# Patient Record
Sex: Male | Born: 1935
Health system: Southern US, Community
[De-identification: ages and names within clinical notes are randomized; demographics above are authoritative.]

## PROBLEM LIST (undated history)

## (undated) DIAGNOSIS — K1379 Other lesions of oral mucosa: Secondary | ICD-10-CM

## (undated) DIAGNOSIS — I10 Essential (primary) hypertension: Secondary | ICD-10-CM

## (undated) DIAGNOSIS — E039 Hypothyroidism, unspecified: Secondary | ICD-10-CM

## (undated) DIAGNOSIS — Z8673 Personal history of transient ischemic attack (TIA), and cerebral infarction without residual deficits: Secondary | ICD-10-CM

## (undated) DIAGNOSIS — E559 Vitamin D deficiency, unspecified: Secondary | ICD-10-CM

## (undated) DIAGNOSIS — U071 COVID-19: Secondary | ICD-10-CM

## (undated) DIAGNOSIS — M1990 Unspecified osteoarthritis, unspecified site: Secondary | ICD-10-CM

## (undated) DIAGNOSIS — K602 Anal fissure, unspecified: Secondary | ICD-10-CM

## (undated) DIAGNOSIS — I119 Hypertensive heart disease without heart failure: Secondary | ICD-10-CM

## (undated) DIAGNOSIS — E785 Hyperlipidemia, unspecified: Secondary | ICD-10-CM

## (undated) DIAGNOSIS — B029 Zoster without complications: Secondary | ICD-10-CM

## (undated) DIAGNOSIS — K5790 Diverticulosis of intestine, part unspecified, without perforation or abscess without bleeding: Secondary | ICD-10-CM

## (undated) DIAGNOSIS — H269 Unspecified cataract: Secondary | ICD-10-CM

## (undated) DIAGNOSIS — E079 Disorder of thyroid, unspecified: Secondary | ICD-10-CM

## (undated) DIAGNOSIS — K648 Other hemorrhoids: Secondary | ICD-10-CM

## (undated) DIAGNOSIS — K219 Gastro-esophageal reflux disease without esophagitis: Secondary | ICD-10-CM

## (undated) DIAGNOSIS — M519 Unspecified thoracic, thoracolumbar and lumbosacral intervertebral disc disorder: Secondary | ICD-10-CM

## (undated) DIAGNOSIS — N400 Enlarged prostate without lower urinary tract symptoms: Secondary | ICD-10-CM

## (undated) DIAGNOSIS — I2510 Atherosclerotic heart disease of native coronary artery without angina pectoris: Secondary | ICD-10-CM

## (undated) DIAGNOSIS — F329 Major depressive disorder, single episode, unspecified: Secondary | ICD-10-CM

## (undated) DIAGNOSIS — H43819 Vitreous degeneration, unspecified eye: Secondary | ICD-10-CM

## (undated) DIAGNOSIS — J189 Pneumonia, unspecified organism: Secondary | ICD-10-CM

## (undated) DIAGNOSIS — I4891 Unspecified atrial fibrillation: Secondary | ICD-10-CM

## (undated) HISTORY — DX: COVID-19: U07.1

## (undated) HISTORY — DX: Hyperlipidemia, unspecified: E78.5

## (undated) HISTORY — DX: Anal fissure, unspecified: K60.2

## (undated) HISTORY — DX: Unspecified osteoarthritis, unspecified site: M19.90

## (undated) HISTORY — DX: Other lesions of oral mucosa: K13.79

## (undated) HISTORY — DX: Major depressive disorder, single episode, unspecified: F32.9

## (undated) HISTORY — PX: EYE SURGERY: SHX253

## (undated) HISTORY — PX: TONSILLECTOMY AND ADENOIDECTOMY: SUR1326

## (undated) HISTORY — DX: Essential (primary) hypertension: I10

## (undated) HISTORY — PX: CARDIAC CATHETERIZATION: SHX172

## (undated) HISTORY — DX: Gastro-esophageal reflux disease without esophagitis: K21.9

## (undated) HISTORY — DX: Atherosclerotic heart disease of native coronary artery without angina pectoris: I25.10

## (undated) HISTORY — DX: Other hemorrhoids: K64.8

## (undated) HISTORY — DX: Diverticulosis of intestine, part unspecified, without perforation or abscess without bleeding: K57.90

## (undated) HISTORY — DX: Vitamin D deficiency, unspecified: E55.9

## (undated) HISTORY — DX: Hypothyroidism, unspecified: E03.9

## (undated) HISTORY — DX: Unspecified atrial fibrillation: I48.91

## (undated) HISTORY — DX: Unspecified cataract: H26.9

## (undated) HISTORY — DX: Disorder of thyroid, unspecified: E07.9

## (undated) HISTORY — DX: Unspecified thoracic, thoracolumbar and lumbosacral intervertebral disc disorder: M51.9

## (undated) HISTORY — DX: Vitreous degeneration, unspecified eye: H43.819

## (undated) HISTORY — DX: Zoster without complications: B02.9

## (undated) HISTORY — DX: Pneumonia, unspecified organism: J18.9

---

## 1993-12-17 DIAGNOSIS — M519 Unspecified thoracic, thoracolumbar and lumbosacral intervertebral disc disorder: Secondary | ICD-10-CM

## 1993-12-17 HISTORY — DX: Unspecified thoracic, thoracolumbar and lumbosacral intervertebral disc disorder: M51.9

## 1993-12-17 HISTORY — PX: OTHER SURGICAL HISTORY: SHX169

## 1995-12-18 HISTORY — PX: HERNIA REPAIR: SHX51

## 1998-07-25 ENCOUNTER — Ambulatory Visit (HOSPITAL_COMMUNITY): Admission: RE | Admit: 1998-07-25 | Discharge: 1998-07-25 | Payer: Self-pay | Admitting: Gastroenterology

## 1998-11-04 ENCOUNTER — Ambulatory Visit (HOSPITAL_COMMUNITY): Admission: RE | Admit: 1998-11-04 | Discharge: 1998-11-04 | Payer: Self-pay | Admitting: Cardiology

## 1998-11-16 HISTORY — PX: CORONARY ARTERY BYPASS GRAFT: SHX141

## 1998-11-19 ENCOUNTER — Inpatient Hospital Stay (HOSPITAL_COMMUNITY): Admission: EM | Admit: 1998-11-19 | Discharge: 1998-11-27 | Payer: Self-pay | Admitting: Emergency Medicine

## 1998-11-19 ENCOUNTER — Encounter: Payer: Self-pay | Admitting: Emergency Medicine

## 1998-11-22 ENCOUNTER — Encounter: Payer: Self-pay | Admitting: Cardiology

## 1998-11-23 ENCOUNTER — Encounter: Payer: Self-pay | Admitting: Cardiology

## 1998-11-24 ENCOUNTER — Encounter: Payer: Self-pay | Admitting: Cardiothoracic Surgery

## 2000-03-25 ENCOUNTER — Ambulatory Visit (HOSPITAL_COMMUNITY): Admission: RE | Admit: 2000-03-25 | Discharge: 2000-03-25 | Payer: Self-pay | Admitting: Gastroenterology

## 2000-03-25 ENCOUNTER — Encounter: Payer: Self-pay | Admitting: Gastroenterology

## 2000-03-26 ENCOUNTER — Ambulatory Visit (HOSPITAL_COMMUNITY): Admission: RE | Admit: 2000-03-26 | Discharge: 2000-03-26 | Payer: Self-pay | Admitting: Gastroenterology

## 2000-03-26 ENCOUNTER — Encounter: Payer: Self-pay | Admitting: Gastroenterology

## 2001-12-18 ENCOUNTER — Inpatient Hospital Stay (HOSPITAL_COMMUNITY): Admission: EM | Admit: 2001-12-18 | Discharge: 2001-12-21 | Payer: Self-pay | Admitting: Emergency Medicine

## 2001-12-18 ENCOUNTER — Encounter: Payer: Self-pay | Admitting: Emergency Medicine

## 2001-12-19 ENCOUNTER — Encounter: Payer: Self-pay | Admitting: Neurology

## 2002-09-28 ENCOUNTER — Encounter: Payer: Self-pay | Admitting: Neurology

## 2002-09-28 ENCOUNTER — Ambulatory Visit (HOSPITAL_COMMUNITY): Admission: RE | Admit: 2002-09-28 | Discharge: 2002-09-28 | Payer: Self-pay | Admitting: Neurology

## 2002-12-17 DIAGNOSIS — F32A Depression, unspecified: Secondary | ICD-10-CM

## 2002-12-17 HISTORY — DX: Depression, unspecified: F32.A

## 2003-09-10 ENCOUNTER — Encounter: Payer: Self-pay | Admitting: Family Medicine

## 2003-09-10 ENCOUNTER — Encounter: Admission: RE | Admit: 2003-09-10 | Discharge: 2003-09-10 | Payer: Self-pay | Admitting: Family Medicine

## 2003-09-11 ENCOUNTER — Encounter: Payer: Self-pay | Admitting: Family Medicine

## 2003-09-11 ENCOUNTER — Encounter: Admission: RE | Admit: 2003-09-11 | Discharge: 2003-09-11 | Payer: Self-pay | Admitting: Family Medicine

## 2005-12-17 LAB — HM COLONOSCOPY

## 2005-12-31 ENCOUNTER — Ambulatory Visit (HOSPITAL_COMMUNITY): Admission: RE | Admit: 2005-12-31 | Discharge: 2005-12-31 | Payer: Self-pay | Admitting: Gastroenterology

## 2006-01-01 ENCOUNTER — Ambulatory Visit (HOSPITAL_COMMUNITY): Admission: RE | Admit: 2006-01-01 | Discharge: 2006-01-01 | Payer: Self-pay | Admitting: Gastroenterology

## 2006-05-17 HISTORY — PX: OTHER SURGICAL HISTORY: SHX169

## 2007-07-18 HISTORY — PX: PROSTATE BIOPSY: SHX241

## 2007-07-18 HISTORY — PX: OTHER SURGICAL HISTORY: SHX169

## 2007-09-19 ENCOUNTER — Encounter (INDEPENDENT_AMBULATORY_CARE_PROVIDER_SITE_OTHER): Payer: Self-pay | Admitting: Otolaryngology

## 2007-09-19 ENCOUNTER — Ambulatory Visit (HOSPITAL_BASED_OUTPATIENT_CLINIC_OR_DEPARTMENT_OTHER): Admission: RE | Admit: 2007-09-19 | Discharge: 2007-09-19 | Payer: Self-pay | Admitting: Otolaryngology

## 2008-05-28 ENCOUNTER — Encounter: Admission: RE | Admit: 2008-05-28 | Discharge: 2008-05-28 | Payer: Self-pay | Admitting: Family Medicine

## 2008-06-14 ENCOUNTER — Encounter: Admission: RE | Admit: 2008-06-14 | Discharge: 2008-06-14 | Payer: Self-pay | Admitting: Neurology

## 2008-11-26 ENCOUNTER — Encounter: Admission: RE | Admit: 2008-11-26 | Discharge: 2008-11-26 | Payer: Self-pay | Admitting: Family Medicine

## 2009-04-16 LAB — HM PAP SMEAR

## 2009-06-07 ENCOUNTER — Encounter: Admission: RE | Admit: 2009-06-07 | Discharge: 2009-06-07 | Payer: Self-pay | Admitting: Family Medicine

## 2010-05-17 LAB — HM MAMMOGRAPHY

## 2010-10-27 ENCOUNTER — Encounter: Admission: RE | Admit: 2010-10-27 | Discharge: 2010-10-27 | Payer: Self-pay | Admitting: Family Medicine

## 2011-02-23 ENCOUNTER — Other Ambulatory Visit: Payer: Self-pay | Admitting: Family Medicine

## 2011-02-23 DIAGNOSIS — R911 Solitary pulmonary nodule: Secondary | ICD-10-CM

## 2011-02-26 ENCOUNTER — Ambulatory Visit
Admission: RE | Admit: 2011-02-26 | Discharge: 2011-02-26 | Disposition: A | Discharge status: Home / Self Care | Payer: Medicare Other | Source: Ambulatory Visit | Attending: Family Medicine | Admitting: Family Medicine

## 2011-02-26 DIAGNOSIS — R911 Solitary pulmonary nodule: Secondary | ICD-10-CM

## 2011-02-26 MED ORDER — IOHEXOL 300 MG/ML  SOLN
75.0000 mL | Freq: Once | INTRAMUSCULAR | Status: AC | PRN
  Administered 2011-02-26: 75 mL via INTRAVENOUS

## 2011-04-23 ENCOUNTER — Encounter: Payer: Self-pay | Admitting: Family Medicine

## 2011-05-01 NOTE — Op Note (Signed)
NAMECONSTANCE, Dalton Vaughan                 ACCOUNT NO.:  [id number]   MEDICAL RECORD NO.:  [id number]          PATIENT TYPE:  AMB   LOCATION:  [place]                          FACILITY:  [facility]   PHYSICIAN:  Christopher E. Lucia Gaskins, M.D.DATE OF BIRTH:  1929/11/30   DATE OF PROCEDURE:  09/19/2007  DATE OF DISCHARGE:                               OPERATIVE REPORT   PREOPERATIVE DIAGNOSIS:  Right posterior neck lymphadenopathy.   POSTOPERATIVE DIAGNOSIS:  Right posterior neck lymphadenopathy.   OPERATION:  Excisional biopsy of right posterior neck node.   SURGEON:  Leonides Sake. Lucia Gaskins, M.D.   ANESTHESIA:  Local, 1% Xylocaine with 1:100,000 epinephrine.   COMPLICATIONS:  None.   BRIEF CLINICAL NOTE:  Dalton Vaughan is a 77 year old gentleman who has had  a right neck lymph node enlarged for several months now.  On exam, he  has an approximately 0.5-cm slightly firm enlarged right posterior  cervical chain lymph node.  He has no other significant abnormality in  the more anterior neck.  He is taken to the operating room at this time  for excisional biopsy of right posterior neck node.   DESCRIPTION OF PROCEDURE:  The patient was brought to the operating  room.  The neck was prepped with Betadine solution and injected with  Xylocaine with epinephrine for local anesthetic.   An incision was made directly over the node.  Dissection was carried  down through the subcutaneous tissue.  The node was identified and  excised entirely with some surrounding fat.  This was sent in saline  fresh to pathology.  Hemostasis was obtained with 4-0 chromic ligature  around a small bleeding vessel and then closed with 4-0 chromic suture  subcutaneously and 5-0 nylon on the skin.  Bacitracin ointment dressing  was applied.  Eithen tolerated this well.   DISPOSITION:  Dalton Vaughan is discharged home later this morning.  Will have him  follow up in my office in 1 week for recheck to review pathology and  have sutures  removed.           ______________________________  Leonides Sake Lucia Gaskins, M.D.     CEN/MEDQ  D:  09/19/2007  T:  09/19/2007  Job:  [id number]   cc:   Chipper Herb, M.D.

## 2011-05-04 NOTE — Consult Note (Signed)
[place]. [facility]  Patient:    Dalton Vaughan, Dalton Vaughan Visit Number: [id number] MRN: [id number]          Service Type: MED Location: [phone] Attending Physician:  Meredith Leeds Dictated by:   Joyice Faster Mare Ferrari, M.D. Proc. Date: 12/20/01 Admit Date:  12/18/2001   CC:         Alyson Locket. Love, M.D.  Kerry Hough., M.D.  Clois Dupes, M.D.   Consultation Report  Thank you for asking me to see this pleasant 77 year old gentleman for evaluation of sinus bradycardia.  Dalton Vaughan has a long history of high blood pressure and has known coronary artery disease.  In 1999, he underwent coronary artery bypass graft surgery x 5 by Dr. Lanelle Bal at [facility].  He has done well in terms of his coronary disease and has been followed at yearly intervals by Dr. Viona Gilmore. Dalton Vaughan.  His last stress test was a treadmill Cardiolite done on January 13, 1999, and at that time was negative for ischemia and demonstrated a good ejection fraction of 67%.  The patient has had no symptoms of referred angina.  He has been on low-dose Lanoxin since his bypass surgery in 1999.  He has had symptoms of palpitations and had a workup for palpitations with an outpatient king of hearts event monitor in August of 2000, which showed normal sinus rhythm with occasional PACs, PVCs, and short runs of PAT, but no sustained arrhythmia and no life threatening arrhythmia.  He has had a long history of tendency towards slow pulse.  The patient does get regular exercise.  Every other day, he exercises for about an hour and a half with rowing machine, bicycle, home treadmill, and weights.  He has not been experiencing any cardiac symptoms with that exercise regimen.  PRESENT MEDICATIONS AT THE TIME OF ADMISSION: 1. Altace 10 mg q.d. 2. Aspirin 325 mg q.d. 3. Cardura 6 mg q.d. 4. Zocor 10 mg q.d. 5. Prevacid 30 mg q.d. 6. Lanoxin 0.125 mg q.d. with an extra  half-tablet on Saturdays and Sundays    for the past two weeks. 7. Hydrochlorothiazide/triamterene 50/75 1 daily. 8. Folic acid 280 mg daily. 9. Multivitamin once a day.  FAMILY HISTORY:  Negative for premature coronary disease or pacemakers.  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  He is a former smoker, having quit about 40 years ago.  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  REVIEW OF SYSTEMS:  The patient has not been experiencing any abdominal pain. He is not having any genitourinary symptoms.  He was admitted this time with transient left-sided weakness and numbness associated with a television-like linear line visible visible in the left eye.  PHYSICAL EXAMINATION:  VITAL SIGNS:  Blood pressure 136/80, pulse 52 and regular, respirations normal, and he is afebrile.  HEAD AND NECK:  Pupils are equal and reactive.  Extraocular movements are full.  Mouth and pharynx are normal.  Jugular venous pressure is normal. Carotids without bruits.  Thyroid is not enlarged.  There is no lymphadenopathy.  LUNGS:  Clear to percussion and auscultation.  HEART:  Soft aortic insufficiency murmur at the left sternal edge.  There is no S3 or S4 gallop.  The first and second heart sounds were of good quality. There is no rub.  ABDOMEN:  Soft, without evidence of hepatosplenomegaly or masses.  There is no  tenderness.  The extremities show excellent pulses.  No phlebitis or edema.  LABORATORY DATA:  The electrocardiogram shows sinus bradycardia, but otherwise within normal limits.  Chest x-Tayler shows mild cardiomegaly, but no CHF.  His 2-D echocardiogram shows good LV function, aortic valve sclerosis with mild aortic insufficiency, mild mitral regurgitation, and normal left atrial size.  There is no source of embolus seen.  IMPRESSIONS: 1. Sinus bradycardia which is chronic and probably  exacerbated by even small    doses of Lanoxin. 2. Normal left ventricular function and no cardiac source of embolus seen on    2-D echocardiogram.  RECOMMENDATIONS: 1. Agree with stopping the Lanoxin altogether, which was done this morning.    There is no clear indication for its continued use now. 2. If his neurological workup is complete, he could probably be discharged    this weekend from the hospital and follow up with Dr. Wynonia Lawman regarding his    sinus bradycardia, possibly with another king of hearts.  There is no    indication at this time for the need for permanent pacemaker for his    sinus bradycardia. Dictated by:   Joyice Faster Mare Ferrari, M.D. Attending Physician:  Meredith Leeds DD:  12/20/01 TD:  12/20/01 Job: [id number]

## 2011-05-04 NOTE — Discharge Summary (Signed)
[place]. [facility]  Patient:    Dalton Vaughan, Dalton Vaughan Visit Number: [id number] MRN: [id number]          Service Type: MED Location: [phone] Attending Physician:  Meredith Leeds Dictated by:   Flint Melter Jacolyn Reedy, M.D. Admit Date:  12/18/2001 Discharge Date: 12/21/2001   CC:         Kerry Hough., M.D.  Alyson Locket. Love, M.D.   Discharge Summary  ADMISSION DIAGNOSES: 1. Left-sided weakness with numbness and left eye visual loss. 2. Possible posterior right cerebral artery stroke versus migraine.  DISCHARGE DIAGNOSES: 1. Probable right thallamic and parietal occipital stroke and PCA    distribution. 2. Sinus bradycardia. 3. Hypertension. 4. Remote coronary artery disease.  DISCHARGE MEDICATIONS: Plavix 75 mg one a day.  The patient is to resume other medicines except for Lanoxin which has been discontinued.  Those other medicines are aspirin 325 mg one a day, Altace 10 mg daily, Triamterene hydrochlorothiazide 37.5/12.5 one a day, Prevacid 30 mg a day, Cardura 6 mg one a day, Zocor 10 mg one a day, multivitamin one a day, folic acid 778 IU one a day, Guaifenesin and Lesaphin as needed.  ACTIVITY:  He should gradually resume normal activity as tolerated.  No dietary restrictions or wound care instructions.  DISCHARGE INSTRUCTIONS:  The patient is reminded not to take anymore Lanoxin. He should notify his doctors if any recurrent spells occur.  FOLLOW-UP:  With neurology, call [phone] for appointment with Dr. Erling Cruz or Dr. Jacolyn Reedy in three to four weeks.  For cardiology, he should contact Dr. Ezekiel Slocumb office as soon as possible for reevaluation in one to two weeks, to continue to evaluate his sinus bradycardia.  CONDITION ON DISCHARGE:  Improved and normal neurologic examination.  STUDIES:  MRI scan which showed diffusion weighted in ADC images compatible with recent infarction involving posterior aspect of the thallamus and  right posterior parietal occipital territory in the distribution of the right posterior cerebral artery.  MR angiography showed mild intracranial atherosclerotic changes, but no site of arterial occlusion or severe stenosis seen.  CT of the head on admission on December 18, 2001, was negative.  Chest x-Tin showed some cardiomegaly, but no active disease.  EKG showed sinus bradycardia.  LABORATORY DATA:  Digoxin level was less than 0.1.  He had a normal CBC except for a minimally low platelet count at 134, white blood cell count 5.9, hematocrit 39.1, hemoglobin 13.3.  PT 13.6, INR 1.1, PTT 37.  He had a normal chemistry except for a minimally elevated random glucose at 119.  Lipid profile was normal with a cholesterol of 131.  He had a slightly low HDL of 37. Carotid Doppler studies were done which showed no ICA stenosis and antegrade vertebral flow which was stable.  HOSPITAL COURSE:  Please refer to history and physical for details.  Briefly, Dalton Vaughan is a 77 year old man with a 20-year history of hypertension, coronary artery bypass grafting for coronary artery disease, remote cigarette use, but no known history of previous stroke or diabetes.  For the last four to five months he had noted visual disturbance which was described as a squiggly line occurring crescentically superiorly in the left eye which would last about five minutes.  Generally not associated with headache or any other symptoms.  But on the night of admission, he had onset with this visual change of left arm feeling cold and numb like a chill, left leg being numb, and some  weakness on the left side which lasted about 15 minutes to 1 hour.  He also had a right periorbital headache at that time.  No known previous history of migraines as a younger man or child.  No family history of migraine.  Symptoms resolved and he had a normal examination at the time he was seen.  He had studies including carotid Doppler, MRI, and  MR angiogram of the brain which as noted revealed a probable acute stroke in the right posterior thallamus which would be consistent with some of his symptoms.  This was in the PCA territory. It is not entirely consistent with the vision loss in the left eye unless he has a visual field cut, but he is not describing it that way.  He was noted to have some significant sinus bradycardia down in the low 40s and even into the mid-30s at one point getting as low as 35.  He was seen by cardiology and felt we should discontinue his Lanoxin.  He stayed in the 40s after that.  He had no symptoms.  It is felt that he has had a probable small right thallamic infarction which could be a complicated migraine versus hypoperfusion secondary to bradycardia.  He is discharged on Plavix in addition to the aspirin and will discontinue the Lanoxin and follow up with Dr. Almond Lint closely on that problem. Dictated by:   Flint Melter Jacolyn Reedy, M.D. Attending Physician:  Meredith Leeds DD:  12/21/01 TD:  12/21/01 Job: [phone]

## 2011-05-04 NOTE — H&P (Signed)
[place]. [facility]  Patient:    Dalton Vaughan, Dalton Vaughan Visit Number: [id number] MRN: [id number]          Service Type: MED Location: [phone] Attending Physician:  Meredith Leeds Dictated by:   Alyson Locket. Love, M.D. Admit Date:  12/18/2001   CC:         Clois Dupes, M.D.   History and Physical  PATIENT ADDRESS: [address] [place], [place] BIRTH: 1929-02-15  CHIEF COMPLAINT: This is one of several [place]. [facility] admissions for this 77 year old right-handed married white male from [place], [place], admitted from the emergency room for evaluation of transient left-sided weakness and numbness.  HISTORY OF PRESENT ILLNESS: Mr. Kelty has a 20 year history of hypertension, and has undergone five-vessel coronary artery bypass surgery by Dr. Servando Snare. He has also a remote history of cigarette use, but no known history of diabetes mellitus or stroke.  Over the last four to five months he has noted a visual disturbance described as squiggly lines occurring in the vision of his left eye only, lasting approximately five minutes.  This would not be associated with headache, syncope, chest pain, or seizure.  The evening of admission at about 7 p.m. he went to check on his grandchild and noted the onset of dizziness, left eye squiggly line sensation, left arm feeling cold like "a chill," left leg numbness, and weakness in his left leg.  This lasted 15 minutes to one hour.  He had a right supraorbital headache at the time.  He has no known history of migraine or history of car sickness as a child.  He has been on one aspirin per day.  He has a remote history of cigarette use but currently does not use cigarettes.  PAST MEDICAL HISTORY:  1. Five-vessel coronary artery bypass surgery in 1999 by Dr. Servando Snare.  2. Right lumbar laminectomy in 1995 at L3-4.  3. Hypertension x 20 years.  4. History of GC in  the past.  5. Chronic bronchitis.  6. Tonsillectomy.  7. Gastroesophageal reflux disease.  He has no known history of diabetes mellitus or strokes.  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  He retired as Librarian, academic about five years ago.  His education is ninth grade but he did get a graduate equivalency degree.  MEDICATIONS:  1. Altace 10 mg q.d.  2. Aspirin 325 mg q.d.  3. Cardura 6 mg q.d.  4. Zocor 10 mg q.d.  5. Prevacid 30 mg q.d.  6. Lanoxin 0.125 mg q.d.  7. Hydrochlorothiazide triamterene 38/45 mg q.d.  8. Folic acid 364 mg q.d.  9. Multivitamin one q.d.  FAMILY HISTORY: His mother is 31, living and well.  His father died at 52 in an accident.  He has one sister, 48, living and well.  He has a half-brother, 47, and a half-sister, 71, living and well.  PHYSICAL EXAMINATION:  GENERAL: Well-developed, pleasant, thin white male in no acute distress.  VITAL SIGNS:  Blood pressure right arm was 170/80, left arm 160/80.  Heart rate was in the 60s.  HEENT: There were no cranial, cervical, or orbital bruits heard.  Tympanic membranes clear.  NECK: Flexion and extension maneuvers unremarkable.  Thyroid not enlarged.  NEUROLOGIC: Mental status, he was alert and oriented x 3.  Followed one, two, and three step  commands.  Cranial nerve examination revealed visual fields to be full, discs were flat, EOMI.  Corneals were present.  There was no seventh nerve palsy.  Hearing was decreased with air conduction greater than bone conduction.  Tongue was midline.  Uvula midline.  Gags were present. Sternocleidomastoid and trapezius testing normal.  Motor examination revealed 5/5 strength proximally and distally in upper and lower extremities without any evidence of proximal pronator or distal drift.  Coordination testing revealed finger-to-nose and heel-to-shin to be within normal limits.   Sensory examination was intact to pinprick, light touch, position, and vibration testing.  Deep tendon reflexes were 1-2+ and plantar responses were downgoing. Gait examination was not evaluated.  LUNGS: Clear to auscultation.  HEART: No murmurs.  GU: Uncircumcised.  EXTREMITIES: No clubbing, cyanosis, or edema.  LABORATORY DATA: CT scan of the brain without contrast was normal.  A 12 lead EKG was normal.  WBC 5900, hemoglobin 13.3, hematocrit 39.1; platelet count 134,000.  Sodium 135, potassium 4.0, chloride 101, CO2 content 30, glucose 119, BUN 12, creatinine 0.8, calcium 8.9, total protein 6.5, albumin 3.6, SGOT 25, ALT 21, alkaline phosphatase 77, total bilirubin 0.6.  IMPRESSION:  1. Left-sided weakness, code 342.10, with numbness, code 782.0, left eye     visual loss, code 368.87.  2. Consider right posterior cerebral artery stroke, code 434.01, versus     migraine, code 346.1  3. Coronary artery disease, status post five-vessel coronary artery bypass     surgery, code 429.2.  4. Hypertension, code 796.2.  PLAN: Admit the patient for further evaluation. Dictated by:   Alyson Locket. Love, M.D. Attending Physician:  Meredith Leeds DD:  12/19/01 TD:  12/19/01 Job: [id number]

## 2011-05-04 NOTE — Op Note (Signed)
Dalton Vaughan, Dalton Vaughan                 ACCOUNT NO.:  [id number]   MEDICAL RECORD NO.:  [id number]          PATIENT TYPE:  AMB   LOCATION:  ENDO                         FACILITY:  [facility]   PHYSICIAN:  John C. Amedeo Plenty, M.D.    DATE OF BIRTH:  Jul 06, 1929   DATE OF PROCEDURE:  12/31/2005  DATE OF DISCHARGE:                                 OPERATIVE REPORT   PROCEDURE:  Colonoscopy.   INDICATIONS FOR PROCEDURE:  Average risk colon cancer screening.   DESCRIPTION OF PROCEDURE:  The patient was placed in the left lateral  decubitus position then placed on the pulse monitor with continuous low-flow  oxygen delivered by nasal cannula. He was sedated with 50 mcg IV fentanyl  and 5 mg IV Versed. The Olympus video colonoscope was inserted into the  rectum and advanced as far as possible but despite abdominal pressure,  torquing maneuvers and change of position including the right lateral  decubitus position, I could not reach the cecum and this appeared to be in  part due to a very tortuous colon and also due to a very poor prep. It was  not certain but thought that the point of furthest advancement was somewhere  in the vicinity of the hepatic flexure. The prep was generally suboptimal  throughout and I could not rule out lesions less than 1 to 1 1/2 cm in all  areas. Otherwise the visualized portions of the transverse and descending  colon appeared normal with no masses, polyps, diverticula or other mucosal  abnormalities. There were several large diverticula seen in the sigmoid  colon and no other abnormalities. The rectum appeared normal and retroflexed  view of the anus revealed no obvious hemorrhoids. The prep was limited even  in the rectum, however, the scope was then withdrawn and the patient  returned to the recovery room in stable condition. He tolerated the  procedure well and there were no immediate complications.   IMPRESSION:  Normal incomplete study to the hepatic flexor was  suboptimal  prep.   PLAN:  Will proceed with barium enema today or tomorrow.           ______________________________  Elyse Jarvis Amedeo Plenty, M.D.     JCH/MEDQ  D:  12/31/2005  T:  12/31/2005  Job:  [id number]   cc:   Chipper Herb, M.D.  Fax: [phone]

## 2012-01-09 DIAGNOSIS — N419 Inflammatory disease of prostate, unspecified: Secondary | ICD-10-CM | POA: Diagnosis not present

## 2012-01-09 DIAGNOSIS — I4891 Unspecified atrial fibrillation: Secondary | ICD-10-CM | POA: Diagnosis not present

## 2012-01-09 DIAGNOSIS — R109 Unspecified abdominal pain: Secondary | ICD-10-CM | POA: Diagnosis not present

## 2012-01-22 DIAGNOSIS — F4323 Adjustment disorder with mixed anxiety and depressed mood: Secondary | ICD-10-CM | POA: Diagnosis not present

## 2012-01-31 DIAGNOSIS — M25569 Pain in unspecified knee: Secondary | ICD-10-CM | POA: Diagnosis not present

## 2012-01-31 DIAGNOSIS — E785 Hyperlipidemia, unspecified: Secondary | ICD-10-CM | POA: Diagnosis not present

## 2012-01-31 DIAGNOSIS — I10 Essential (primary) hypertension: Secondary | ICD-10-CM | POA: Diagnosis not present

## 2012-02-07 DIAGNOSIS — M1710 Unilateral primary osteoarthritis, unspecified knee: Secondary | ICD-10-CM | POA: Diagnosis not present

## 2012-03-03 DIAGNOSIS — I4891 Unspecified atrial fibrillation: Secondary | ICD-10-CM | POA: Diagnosis not present

## 2012-03-03 DIAGNOSIS — Z1212 Encounter for screening for malignant neoplasm of rectum: Secondary | ICD-10-CM | POA: Diagnosis not present

## 2012-03-03 DIAGNOSIS — E039 Hypothyroidism, unspecified: Secondary | ICD-10-CM | POA: Diagnosis not present

## 2012-03-03 DIAGNOSIS — R7989 Other specified abnormal findings of blood chemistry: Secondary | ICD-10-CM | POA: Diagnosis not present

## 2012-03-03 DIAGNOSIS — E559 Vitamin D deficiency, unspecified: Secondary | ICD-10-CM | POA: Diagnosis not present

## 2012-03-03 DIAGNOSIS — E785 Hyperlipidemia, unspecified: Secondary | ICD-10-CM | POA: Diagnosis not present

## 2012-04-30 DIAGNOSIS — R972 Elevated prostate specific antigen [PSA]: Secondary | ICD-10-CM | POA: Diagnosis not present

## 2012-04-30 DIAGNOSIS — N401 Enlarged prostate with lower urinary tract symptoms: Secondary | ICD-10-CM | POA: Diagnosis not present

## 2012-04-30 DIAGNOSIS — N402 Nodular prostate without lower urinary tract symptoms: Secondary | ICD-10-CM | POA: Diagnosis not present

## 2012-05-13 DIAGNOSIS — E785 Hyperlipidemia, unspecified: Secondary | ICD-10-CM | POA: Diagnosis not present

## 2012-05-13 DIAGNOSIS — I2510 Atherosclerotic heart disease of native coronary artery without angina pectoris: Secondary | ICD-10-CM | POA: Diagnosis not present

## 2012-05-13 DIAGNOSIS — Z7901 Long term (current) use of anticoagulants: Secondary | ICD-10-CM | POA: Diagnosis not present

## 2012-05-13 DIAGNOSIS — I4891 Unspecified atrial fibrillation: Secondary | ICD-10-CM | POA: Diagnosis not present

## 2012-05-13 DIAGNOSIS — I119 Hypertensive heart disease without heart failure: Secondary | ICD-10-CM | POA: Diagnosis not present

## 2012-05-14 DIAGNOSIS — I4891 Unspecified atrial fibrillation: Secondary | ICD-10-CM | POA: Diagnosis not present

## 2012-05-14 DIAGNOSIS — E785 Hyperlipidemia, unspecified: Secondary | ICD-10-CM | POA: Diagnosis not present

## 2012-05-14 DIAGNOSIS — I10 Essential (primary) hypertension: Secondary | ICD-10-CM | POA: Diagnosis not present

## 2012-06-12 DIAGNOSIS — I4891 Unspecified atrial fibrillation: Secondary | ICD-10-CM | POA: Diagnosis not present

## 2012-06-13 DIAGNOSIS — J029 Acute pharyngitis, unspecified: Secondary | ICD-10-CM | POA: Diagnosis not present

## 2012-06-13 DIAGNOSIS — J309 Allergic rhinitis, unspecified: Secondary | ICD-10-CM | POA: Diagnosis not present

## 2012-07-04 ENCOUNTER — Emergency Department (HOSPITAL_COMMUNITY)
Admission: EM | Admit: 2012-07-04 | Discharge: 2012-07-05 | Disposition: A | Discharge status: Home / Self Care | Payer: Medicare Other | Attending: Emergency Medicine | Admitting: Emergency Medicine

## 2012-07-04 DIAGNOSIS — I4891 Unspecified atrial fibrillation: Secondary | ICD-10-CM | POA: Insufficient documentation

## 2012-07-04 DIAGNOSIS — IMO0002 Reserved for concepts with insufficient information to code with codable children: Secondary | ICD-10-CM | POA: Diagnosis not present

## 2012-07-04 DIAGNOSIS — Y849 Medical procedure, unspecified as the cause of abnormal reaction of the patient, or of later complication, without mention of misadventure at the time of the procedure: Secondary | ICD-10-CM | POA: Insufficient documentation

## 2012-07-04 DIAGNOSIS — K219 Gastro-esophageal reflux disease without esophagitis: Secondary | ICD-10-CM | POA: Insufficient documentation

## 2012-07-04 DIAGNOSIS — K08109 Complete loss of teeth, unspecified cause, unspecified class: Secondary | ICD-10-CM | POA: Insufficient documentation

## 2012-07-04 DIAGNOSIS — I10 Essential (primary) hypertension: Secondary | ICD-10-CM | POA: Insufficient documentation

## 2012-07-04 DIAGNOSIS — K08409 Partial loss of teeth, unspecified cause, unspecified class: Secondary | ICD-10-CM | POA: Insufficient documentation

## 2012-07-04 DIAGNOSIS — Z7901 Long term (current) use of anticoagulants: Secondary | ICD-10-CM | POA: Insufficient documentation

## 2012-07-04 DIAGNOSIS — I2510 Atherosclerotic heart disease of native coronary artery without angina pectoris: Secondary | ICD-10-CM | POA: Insufficient documentation

## 2012-07-04 DIAGNOSIS — Z951 Presence of aortocoronary bypass graft: Secondary | ICD-10-CM | POA: Insufficient documentation

## 2012-07-04 DIAGNOSIS — E785 Hyperlipidemia, unspecified: Secondary | ICD-10-CM | POA: Insufficient documentation

## 2012-07-05 ENCOUNTER — Encounter (HOSPITAL_COMMUNITY): Payer: Self-pay | Admitting: *Deleted

## 2012-07-05 DIAGNOSIS — K08409 Partial loss of teeth, unspecified cause, unspecified class: Secondary | ICD-10-CM | POA: Diagnosis not present

## 2012-07-05 DIAGNOSIS — I2510 Atherosclerotic heart disease of native coronary artery without angina pectoris: Secondary | ICD-10-CM | POA: Diagnosis not present

## 2012-07-05 DIAGNOSIS — Z7901 Long term (current) use of anticoagulants: Secondary | ICD-10-CM | POA: Diagnosis not present

## 2012-07-05 DIAGNOSIS — I4891 Unspecified atrial fibrillation: Secondary | ICD-10-CM | POA: Diagnosis not present

## 2012-07-05 DIAGNOSIS — I10 Essential (primary) hypertension: Secondary | ICD-10-CM | POA: Diagnosis not present

## 2012-07-05 DIAGNOSIS — K219 Gastro-esophageal reflux disease without esophagitis: Secondary | ICD-10-CM | POA: Diagnosis not present

## 2012-07-05 DIAGNOSIS — E785 Hyperlipidemia, unspecified: Secondary | ICD-10-CM | POA: Diagnosis not present

## 2012-07-05 DIAGNOSIS — IMO0002 Reserved for concepts with insufficient information to code with codable children: Secondary | ICD-10-CM | POA: Diagnosis not present

## 2012-07-05 DIAGNOSIS — K08109 Complete loss of teeth, unspecified cause, unspecified class: Secondary | ICD-10-CM | POA: Diagnosis not present

## 2012-07-05 DIAGNOSIS — Z951 Presence of aortocoronary bypass graft: Secondary | ICD-10-CM | POA: Diagnosis not present

## 2012-07-05 LAB — BASIC METABOLIC PANEL
BUN: 7 mg/dL (ref 6–23)
CO2: 28 mEq/L (ref 19–32)
Calcium: 9.6 mg/dL (ref 8.4–10.5)
Chloride: 100 mEq/L (ref 96–112)
Creatinine, Ser: 0.67 mg/dL (ref 0.50–1.35)
GFR calc Af Amer: >90 mL/min (ref >90)
GFR calc non Af Amer: 88 mL/min — ABNORMAL LOW (ref >90)
Glucose, Bld: 117 mg/dL — ABNORMAL HIGH (ref 70–99)
Potassium: 3.7 mEq/L (ref 3.5–5.1)
Sodium: 137 mEq/L (ref 135–145)

## 2012-07-05 LAB — CBC WITH DIFFERENTIAL/PLATELET
Basophils Absolute: 0 10*3/uL (ref 0.0–0.1)
Basophils Relative: 0 % (ref 0–1)
Eosinophils Absolute: 0.1 10*3/uL (ref 0.0–0.7)
Eosinophils Relative: 1 % (ref 0–5)
HCT: 41.1 % (ref 39.0–52.0)
Hemoglobin: 13.6 g/dL (ref 13.0–17.0)
Lymphocytes Relative: 24 % (ref 12–46)
Lymphs Abs: 1.8 10*3/uL (ref 0.7–4.0)
MCH: 29.3 pg (ref 26.0–34.0)
MCHC: 33.1 g/dL (ref 30.0–36.0)
MCV: 88.6 fL (ref 78.0–100.0)
Monocytes Absolute: 0.5 10*3/uL (ref 0.1–1.0)
Monocytes Relative: 6 % (ref 3–12)
Neutro Abs: 5.2 10*3/uL (ref 1.7–7.7)
Neutrophils Relative %: 69 % (ref 43–77)
Platelets: 167 10*3/uL (ref 150–400)
RBC: 4.64 MIL/uL (ref 4.22–5.81)
RDW: 15.1 % (ref 11.5–15.5)
WBC: 7.6 10*3/uL (ref 4.0–10.5)

## 2012-07-05 LAB — PROTIME-INR
INR: 1.65 — ABNORMAL HIGH (ref 0.00–1.49)
Prothrombin Time: 19.8 seconds — ABNORMAL HIGH (ref 11.6–15.2)

## 2012-07-05 LAB — ABO/RH: ABO/RH(D): O NEG

## 2012-07-05 MED ORDER — LIDOCAINE-EPINEPHRINE (PF) 1 %-1:200000 IJ SOLN
INTRAMUSCULAR | Status: AC
  Administered 2012-07-05: 10 mL
  Filled 2012-07-05: qty 10

## 2012-07-05 MED ORDER — LIDOCAINE-EPINEPHRINE (PF) 2 %-1:200000 IJ SOLN: 20.0000 mL | Freq: Once | INTRAMUSCULAR | Status: DC

## 2012-07-05 NOTE — ED Notes (Signed)
Pt had tooth extracted on Wed; tonight started bleeding around 5pm; hasn't stopped; pt spitting continuously in emesis bag in triage

## 2012-07-05 NOTE — ED Provider Notes (Signed)
History     CSN: [id number]  Arrival date & time 07/04/12  2330   First MD Initiated Contact with Patient 07/05/12 0103      Chief Complaint  Patient presents with  . Post-extraction bleeding     (Consider location/radiation/quality/duration/timing/severity/associated sxs/prior treatment) HPI This is an 78 year old white male who had his right upper second molar extracted 3 days ago. He is normally on Coumadin but stopped taking his Coumadin on the day of extraction. He is been having severe bleeding from the extraction site since yesterday afternoon about 5 PM. This continues despite application of pressure and gauze sponges. He denies lightheadedness, chest pain, dyspnea, nausea or vomiting.  Past Medical History  Diagnosis Date  . Hypertension   . Hyperlipidemia   . Diverticulosis   . GERD (gastroesophageal reflux disease)   . Atrial fibrillation   . Vitreous detachment   . Internal hemorrhoids   . CAD (coronary artery disease)     Dr. Wynonia Lawman    Past Surgical History  Procedure Date  . Bilateral cataract surgery 6/07  . Cysloscopy 8/08  . Prostate biopsy 8/08  . Coronary artery bypass graft 12/99     Dr. Servando Snare     No family history on file.  History  Substance Use Topics  . Smoking status: Not on file  . Smokeless tobacco: Not on file  . Alcohol Use:       Review of Systems  All other systems reviewed and are negative.    Allergies  Baycol; Crestor; Errin; Ezetimibe-simvastatin; Fish oil; Paroxetine hcl; Ureas; and Warfarin sodium  Home Medications   Current Outpatient Rx  Name Route Sig Dispense Refill  . AMLODIPINE BESYLATE 5 MG PO TABS Oral Take 5 mg by mouth daily. Take 1/2 tablet daily     . ASPIRIN 81 MG PO TBEC Oral Take 81 mg by mouth daily.      Marland Kitchen CALCIUM 600 + D PO Oral Take by mouth.      Marland Kitchen VITAMIN D-3 5000 UNITS PO TABS Oral Take by mouth.      . ESOMEPRAZOLE MAGNESIUM 40 MG PO CPDR Oral Take 40 mg by mouth 2 (two) times daily.        Marland Kitchen FOLIC ACID 1 MG PO TABS Oral Take 1 mg by mouth daily.      Marland Kitchen LEVOTHYROXINE SODIUM 25 MCG PO TABS Oral Take 25 mcg by mouth daily.      Marland Kitchen ONE-DAILY MULTI VITAMINS PO TABS Oral Take 1 tablet by mouth daily.      Marland Kitchen RAMIPRIL 10 MG PO TABS Oral Take 10 mg by mouth daily.      Marland Kitchen ROSUVASTATIN CALCIUM 10 MG PO TABS Oral Take 10 mg by mouth. Qod or 1/2qd     . SILDENAFIL CITRATE 100 MG PO TABS Oral Take 100 mg by mouth daily as needed.      . TRIAMTERENE-HCTZ 37.5-25 MG PO TABS Oral Take 1 tablet by mouth daily.        BP 166/104  Pulse 82  Resp 24  Wt 175 lb (79.379 kg)  SpO2 98%  Physical Exam General: Well-developed, well-nourished male in no acute distress; appearance consistent with age of record HENT: normocephalic, atraumatic; bleeding extraction socket of right upper second molar Eyes: pupils equal round and reactive to light; extraocular muscles intact Neck: supple Heart: Irregular rhythm Lungs: clear to auscultation bilaterally Abdomen: soft; nondistended Extremities: No deformity; full range of motion; pulses normal; no edema Neurologic: Awake, alert  and oriented; motor function intact in all extremities and symmetric; no facial droop Skin: Warm and dry Psychiatric: Normal mood and affect    ED Course  Procedures (including critical care time)    MDM   Nursing notes and vitals signs, including pulse oximetry, reviewed.  Summary of this visit's results, reviewed by myself:  Labs:  Results for orders placed during the hospital encounter of 07/04/12  CBC WITH DIFFERENTIAL      Component Value Range   WBC 7.6  4.0 - 10.5 K/uL   RBC 4.64  4.22 - 5.81 MIL/uL   Hemoglobin 13.6  13.0 - 17.0 g/dL   HCT 41.1  39.0 - 52.0 %   MCV 88.6  78.0 - 100.0 fL   MCH 29.3  26.0 - 34.0 pg   MCHC 33.1  30.0 - 36.0 g/dL   RDW 15.1  11.5 - 15.5 %   Platelets 167  150 - 400 K/uL   Neutrophils Relative 69  43 - 77 %   Neutro Abs 5.2  1.7 - 7.7 K/uL   Lymphocytes Relative 24  12 -  46 %   Lymphs Abs 1.8  0.7 - 4.0 K/uL   Monocytes Relative 6  3 - 12 %   Monocytes Absolute 0.5  0.1 - 1.0 K/uL   Eosinophils Relative 1  0 - 5 %   Eosinophils Absolute 0.1  0.0 - 0.7 K/uL   Basophils Relative 0  0 - 1 %   Basophils Absolute 0.0  0.0 - 0.1 K/uL  PROTIME-INR      Component Value Range   Prothrombin Time 19.8 (*) 11.6 - 15.2 seconds   INR 1.65 (*) 0.00 - 4.46  BASIC METABOLIC PANEL      Component Value Range   Sodium 137  135 - 145 mEq/L   Potassium 3.7  3.5 - 5.1 mEq/L   Chloride 100  96 - 112 mEq/L   CO2 28  19 - 32 mEq/L   Glucose, Bld 117 (*) 70 - 99 mg/dL   BUN 7  6 - 23 mg/dL   Creatinine, Ser 0.67  0.50 - 1.35 mg/dL   Calcium 9.6  8.4 - 10.5 mg/dL   GFR calc non Af Amer 88 (*) >90 mL/min   GFR calc Af Amer >90  >90 mL/min    Imaging Studies: No results found.    2:11 AM No improvement with application of lidocaine in epinephrine. HemCon dental dressing applied.  7:07 AM Hemostatic after 2 units of fresh frozen plasma administered. Dr. Hardie Shackleton, the oral surgeon on call for Dr. Romie Minus, was contacted. The patient was advised to contact him by phone should the bleeding resumed.         Wynetta Fines, MD 07/05/12 [phone]

## 2012-07-05 NOTE — ED Notes (Signed)
Lab called regarding FFP, to call this nurse back when ready, pt continues to bleed, Dr. Marisue Humble aware.

## 2012-07-05 NOTE — ED Notes (Signed)
MD at bedside. 

## 2012-07-05 NOTE — ED Notes (Signed)
MD at bedside, spoke with Dentist on call.

## 2012-07-05 NOTE — ED Notes (Signed)
Pts states the (R) upper tooth #2, was taken out on Weds, and still has been continuous bleeding since then.

## 2012-07-06 LAB — PREPARE FRESH FROZEN PLASMA
Unit division: 0
Unit division: 0

## 2012-07-11 ENCOUNTER — Telehealth: Payer: Self-pay | Admitting: Oncology

## 2012-07-11 NOTE — Telephone Encounter (Signed)
Called pt, VM wont take message, will mail appt today

## 2012-07-11 NOTE — Telephone Encounter (Signed)
Talked to pt gave him appt date for 08/20/12 as a new patient he is aware of location and time

## 2012-07-14 ENCOUNTER — Telehealth: Payer: Self-pay | Admitting: Oncology

## 2012-07-14 NOTE — Telephone Encounter (Signed)
Referred by Dr. Morrie Sheldon Dx- Excessive Bleeding. NP packet mailed out.

## 2012-07-22 DIAGNOSIS — F4323 Adjustment disorder with mixed anxiety and depressed mood: Secondary | ICD-10-CM | POA: Diagnosis not present

## 2012-07-24 DIAGNOSIS — Z7901 Long term (current) use of anticoagulants: Secondary | ICD-10-CM | POA: Diagnosis not present

## 2012-07-31 ENCOUNTER — Encounter (HOSPITAL_COMMUNITY): Payer: Self-pay | Admitting: *Deleted

## 2012-07-31 ENCOUNTER — Emergency Department (HOSPITAL_COMMUNITY): Payer: Medicare Other

## 2012-07-31 ENCOUNTER — Observation Stay (HOSPITAL_COMMUNITY)
Admission: EM | Admit: 2012-07-31 | Discharge: 2012-08-01 | Disposition: A | Discharge status: Home / Self Care | Payer: Medicare Other | Attending: Cardiology | Admitting: Cardiology

## 2012-07-31 DIAGNOSIS — R55 Syncope and collapse: Principal | ICD-10-CM | POA: Insufficient documentation

## 2012-07-31 DIAGNOSIS — I495 Sick sinus syndrome: Secondary | ICD-10-CM | POA: Diagnosis present

## 2012-07-31 DIAGNOSIS — M519 Unspecified thoracic, thoracolumbar and lumbosacral intervertebral disc disorder: Secondary | ICD-10-CM | POA: Insufficient documentation

## 2012-07-31 DIAGNOSIS — I442 Atrioventricular block, complete: Secondary | ICD-10-CM | POA: Diagnosis not present

## 2012-07-31 DIAGNOSIS — Z7901 Long term (current) use of anticoagulants: Secondary | ICD-10-CM | POA: Diagnosis not present

## 2012-07-31 DIAGNOSIS — N400 Enlarged prostate without lower urinary tract symptoms: Secondary | ICD-10-CM | POA: Insufficient documentation

## 2012-07-31 DIAGNOSIS — R209 Unspecified disturbances of skin sensation: Secondary | ICD-10-CM | POA: Insufficient documentation

## 2012-07-31 DIAGNOSIS — E782 Mixed hyperlipidemia: Secondary | ICD-10-CM | POA: Insufficient documentation

## 2012-07-31 DIAGNOSIS — I10 Essential (primary) hypertension: Secondary | ICD-10-CM | POA: Diagnosis not present

## 2012-07-31 DIAGNOSIS — I119 Hypertensive heart disease without heart failure: Secondary | ICD-10-CM

## 2012-07-31 DIAGNOSIS — I2510 Atherosclerotic heart disease of native coronary artery without angina pectoris: Secondary | ICD-10-CM | POA: Insufficient documentation

## 2012-07-31 DIAGNOSIS — I4891 Unspecified atrial fibrillation: Secondary | ICD-10-CM | POA: Insufficient documentation

## 2012-07-31 DIAGNOSIS — E785 Hyperlipidemia, unspecified: Secondary | ICD-10-CM | POA: Diagnosis not present

## 2012-07-31 DIAGNOSIS — K219 Gastro-esophageal reflux disease without esophagitis: Secondary | ICD-10-CM | POA: Insufficient documentation

## 2012-07-31 DIAGNOSIS — I499 Cardiac arrhythmia, unspecified: Secondary | ICD-10-CM | POA: Diagnosis not present

## 2012-07-31 DIAGNOSIS — R5381 Other malaise: Secondary | ICD-10-CM | POA: Diagnosis not present

## 2012-07-31 DIAGNOSIS — I25119 Atherosclerotic heart disease of native coronary artery with unspecified angina pectoris: Secondary | ICD-10-CM

## 2012-07-31 DIAGNOSIS — Z8673 Personal history of transient ischemic attack (TIA), and cerebral infarction without residual deficits: Secondary | ICD-10-CM | POA: Diagnosis not present

## 2012-07-31 DIAGNOSIS — I482 Chronic atrial fibrillation, unspecified: Secondary | ICD-10-CM

## 2012-07-31 HISTORY — DX: Unspecified thoracic, thoracolumbar and lumbosacral intervertebral disc disorder: M51.9

## 2012-07-31 HISTORY — DX: Benign prostatic hyperplasia without lower urinary tract symptoms: N40.0

## 2012-07-31 HISTORY — DX: Personal history of transient ischemic attack (TIA), and cerebral infarction without residual deficits: Z86.73

## 2012-07-31 HISTORY — DX: Hypertensive heart disease without heart failure: I11.9

## 2012-07-31 LAB — URINALYSIS, ROUTINE W REFLEX MICROSCOPIC
Bilirubin Urine: NEGATIVE
Glucose, UA: NEGATIVE mg/dL
Hgb urine dipstick: NEGATIVE
Ketones, ur: 15 mg/dL — AB
Leukocytes, UA: NEGATIVE
Nitrite: NEGATIVE
Protein, ur: NEGATIVE mg/dL
Specific Gravity, Urine: 1.009 (ref 1.005–1.030)
Urobilinogen, UA: 0.2 mg/dL (ref 0.0–1.0)
pH: 7 (ref 5.0–8.0)

## 2012-07-31 LAB — COMPREHENSIVE METABOLIC PANEL
ALT: 12 U/L (ref 0–53)
AST: 24 U/L (ref 0–37)
Albumin: 4.1 g/dL (ref 3.5–5.2)
Alkaline Phosphatase: 97 U/L (ref 39–117)
BUN: 8 mg/dL (ref 6–23)
CO2: 28 mEq/L (ref 19–32)
Calcium: 9.3 mg/dL (ref 8.4–10.5)
Chloride: 102 mEq/L (ref 96–112)
Creatinine, Ser: 0.69 mg/dL (ref 0.50–1.35)
GFR calc Af Amer: >90 mL/min (ref >90)
GFR calc non Af Amer: 87 mL/min — ABNORMAL LOW (ref >90)
Glucose, Bld: 100 mg/dL — ABNORMAL HIGH (ref 70–99)
Potassium: 3.8 mEq/L (ref 3.5–5.1)
Sodium: 139 mEq/L (ref 135–145)
Total Bilirubin: 0.6 mg/dL (ref 0.3–1.2)
Total Protein: 7 g/dL (ref 6.0–8.3)

## 2012-07-31 LAB — CBC WITH DIFFERENTIAL/PLATELET
Basophils Absolute: 0 10*3/uL (ref 0.0–0.1)
Basophils Relative: 0 % (ref 0–1)
Eosinophils Absolute: 0 10*3/uL (ref 0.0–0.7)
Eosinophils Relative: 1 % (ref 0–5)
HCT: 36.8 % — ABNORMAL LOW (ref 39.0–52.0)
Hemoglobin: 12.4 g/dL — ABNORMAL LOW (ref 13.0–17.0)
Lymphocytes Relative: 20 % (ref 12–46)
Lymphs Abs: 1.3 10*3/uL (ref 0.7–4.0)
MCH: 29.6 pg (ref 26.0–34.0)
MCHC: 33.7 g/dL (ref 30.0–36.0)
MCV: 87.8 fL (ref 78.0–100.0)
Monocytes Absolute: 0.3 10*3/uL (ref 0.1–1.0)
Monocytes Relative: 5 % (ref 3–12)
Neutro Abs: 4.8 10*3/uL (ref 1.7–7.7)
Neutrophils Relative %: 74 % (ref 43–77)
Platelets: 146 10*3/uL — ABNORMAL LOW (ref 150–400)
RBC: 4.19 MIL/uL — ABNORMAL LOW (ref 4.22–5.81)
RDW: 14.9 % (ref 11.5–15.5)
WBC: 6.5 10*3/uL (ref 4.0–10.5)

## 2012-07-31 LAB — CBC
HCT: 37.2 % — ABNORMAL LOW (ref 39.0–52.0)
Hemoglobin: 12.4 g/dL — ABNORMAL LOW (ref 13.0–17.0)
MCH: 29.2 pg (ref 26.0–34.0)
MCHC: 33.3 g/dL (ref 30.0–36.0)
MCV: 87.7 fL (ref 78.0–100.0)
Platelets: 151 10*3/uL (ref 150–400)
RBC: 4.24 MIL/uL (ref 4.22–5.81)
RDW: 15 % (ref 11.5–15.5)
WBC: 7 10*3/uL (ref 4.0–10.5)

## 2012-07-31 LAB — PROTIME-INR
INR: 1.66 — ABNORMAL HIGH (ref 0.00–1.49)
Prothrombin Time: 19.9 seconds — ABNORMAL HIGH (ref 11.6–15.2)

## 2012-07-31 LAB — TROPONIN I: Troponin I: <0.3 ng/mL (ref <0.30)

## 2012-07-31 MED ORDER — ASPIRIN 81 MG PO TBEC: 81.0000 mg | DELAYED_RELEASE_TABLET | Freq: Every day | ORAL | Status: DC

## 2012-07-31 MED ORDER — WARFARIN SODIUM 2.5 MG PO TABS: 2.5000 mg | ORAL_TABLET | ORAL | Status: DC

## 2012-07-31 MED ORDER — RAMIPRIL 10 MG PO CAPS
10.0000 mg | ORAL_CAPSULE | Freq: Every day | ORAL | Status: DC
  Administered 2012-08-01: 10 mg via ORAL
  Filled 2012-07-31: qty 1

## 2012-07-31 MED ORDER — ENOXAPARIN SODIUM 40 MG/0.4ML ~~LOC~~ SOLN
40.0000 mg | SUBCUTANEOUS | Status: DC
  Administered 2012-07-31: 40 mg via SUBCUTANEOUS
  Filled 2012-07-31 (×2): qty 0.4

## 2012-07-31 MED ORDER — LEVOTHYROXINE SODIUM 25 MCG PO TABS
25.0000 ug | ORAL_TABLET | Freq: Every day | ORAL | Status: DC
  Administered 2012-08-01: 25 ug via ORAL
  Filled 2012-07-31 (×2): qty 1

## 2012-07-31 MED ORDER — RAMIPRIL 10 MG PO TABS: 10.0000 mg | ORAL_TABLET | Freq: Every day | ORAL | Status: DC

## 2012-07-31 MED ORDER — DOXAZOSIN MESYLATE 4 MG PO TABS
6.0000 mg | ORAL_TABLET | Freq: Every day | ORAL | Status: DC
  Administered 2012-07-31: 6 mg via ORAL
  Filled 2012-07-31 (×2): qty 1

## 2012-07-31 MED ORDER — WARFARIN SODIUM 2.5 MG PO TABS: 2.5000 mg | ORAL_TABLET | Freq: Every day | ORAL | Status: DC

## 2012-07-31 MED ORDER — AMLODIPINE BESYLATE 2.5 MG PO TABS
2.5000 mg | ORAL_TABLET | Freq: Two times a day (BID) | ORAL | Status: DC
  Administered 2012-07-31 – 2012-08-01 (×2): 2.5 mg via ORAL
  Filled 2012-07-31 (×4): qty 1

## 2012-07-31 MED ORDER — SODIUM CHLORIDE 0.9 % IJ SOLN
3.0000 mL | Freq: Two times a day (BID) | INTRAMUSCULAR | Status: DC
  Administered 2012-07-31: 3 mL via INTRAVENOUS

## 2012-07-31 MED ORDER — ASPIRIN EC 81 MG PO TBEC
81.0000 mg | DELAYED_RELEASE_TABLET | Freq: Every day | ORAL | Status: DC
  Administered 2012-08-01: 81 mg via ORAL
  Filled 2012-07-31: qty 1

## 2012-07-31 MED ORDER — WARFARIN SODIUM 5 MG PO TABS
5.0000 mg | ORAL_TABLET | ORAL | Status: DC
  Filled 2012-07-31: qty 1

## 2012-07-31 MED ORDER — FOLIC ACID 1 MG PO TABS
1.0000 mg | ORAL_TABLET | Freq: Every day | ORAL | Status: DC
  Administered 2012-08-01: 1 mg via ORAL
  Filled 2012-07-31: qty 1

## 2012-07-31 MED ORDER — WARFARIN - PHYSICIAN DOSING INPATIENT: Freq: Every day | Status: DC

## 2012-07-31 MED ORDER — MIRTAZAPINE 7.5 MG PO TABS
7.5000 mg | ORAL_TABLET | Freq: Every day | ORAL | Status: DC
  Administered 2012-07-31: 7.5 mg via ORAL
  Filled 2012-07-31 (×2): qty 1

## 2012-07-31 MED ORDER — TRIAMTERENE-HCTZ 37.5-25 MG PO TABS
1.0000 | ORAL_TABLET | Freq: Every day | ORAL | Status: DC
  Administered 2012-08-01: 1 via ORAL
  Filled 2012-07-31: qty 1

## 2012-07-31 MED ORDER — CO Q 10 100 MG PO CAPS: 1.0000 | ORAL_CAPSULE | Freq: Every day | ORAL | Status: DC

## 2012-07-31 NOTE — ED Notes (Signed)
Dr. Wynonia Lawman at bedside.

## 2012-07-31 NOTE — Progress Notes (Signed)
PHARMACIST - PHYSICIAN ORDER COMMUNICATION  CONCERNING: P&T Medication Policy on Herbal Medications  DESCRIPTION:  This patient's order for:  Co-enzyme Q10 has been noted.  This product(s) is classified as an "herbal" or natural product. Due to a lack of definitive safety studies or FDA approval, nonstandard manufacturing practices, plus the potential risk of unknown drug-drug interactions while on inpatient medications, the Pharmacy and Therapeutics Committee does not permit the use of "herbal" or natural products of this type within [facility].   ACTION TAKEN: The pharmacy department is unable to verify this order at this time and your patient has been informed of this safety policy. Please reevaluate patient's clinical condition at discharge and address if the herbal or natural product(s) should be resumed at that time.

## 2012-07-31 NOTE — ED Provider Notes (Addendum)
History     CSN: [id number]  Arrival date & time 07/31/12  1352   First MD Initiated Contact with Patient 07/31/12 1355      Chief Complaint  Patient presents with  . Weakness    (Consider location/radiation/quality/duration/timing/severity/associated sxs/prior treatment) Patient is a 78 y.o. male presenting with weakness. The history is provided by the patient.  Weakness The primary symptoms include syncope. Primary symptoms do not include nausea or vomiting. Primary symptoms comment: states was at [place] walking around and started feeling near syncope.  Pt states sx improved while at the PCP office The symptoms began 1 to 2 hours ago. The episode lasted 2 hours. The symptoms are resolved. The neurological symptoms are diffuse.  Before the onset of the syncopal episode there was weakness. There was no vertigo or nausea. The syncopal episode did not occur with shortness of breath.  Additional symptoms include weakness. Additional symptoms do not include leg pain or vertigo. Medical issues also include hypertension. Associated medical issues comments: prior CABG.    Past Medical History  Diagnosis Date  . Hypertension   . Hyperlipidemia   . Diverticulosis   . GERD (gastroesophageal reflux disease)   . Atrial fibrillation   . Vitreous detachment   . Internal hemorrhoids   . CAD (coronary artery disease)     Dr. Wynonia Lawman    Past Surgical History  Procedure Date  . Bilateral cataract surgery 6/07  . Cysloscopy 8/08  . Prostate biopsy 8/08  . Coronary artery bypass graft 12/99     Dr. Servando Snare     History reviewed. No pertinent family history.  History  Substance Use Topics  . Smoking status: Not on file  . Smokeless tobacco: Not on file  . Alcohol Use:       Review of Systems  Respiratory: Negative for cough and shortness of breath.   Cardiovascular: Positive for syncope. Negative for chest pain and leg swelling.  Gastrointestinal: Negative for nausea and vomiting.   Neurological: Positive for weakness. Negative for vertigo.  All other systems reviewed and are negative.    Allergies  Penicillins  Home Medications   Current Outpatient Rx  Name Route Sig Dispense Refill  . AMLODIPINE BESYLATE 5 MG PO TABS Oral Take 2.5 mg by mouth daily.     . ASPIRIN 81 MG PO TBEC Oral Take 81 mg by mouth daily.      Marland Kitchen CALCIUM 600 + D PO Oral Take by mouth.      Marland Kitchen VITAMIN D-3 5000 UNITS PO TABS Oral Take by mouth.      Marland Kitchen DOXAZOSIN MESYLATE 8 MG PO TABS Oral Take 8 mg by mouth at bedtime.    Marland Kitchen ESOMEPRAZOLE MAGNESIUM 40 MG PO CPDR Oral Take 40 mg by mouth 2 (two) times daily.      Marland Kitchen FOLIC ACID 1 MG PO TABS Oral Take 1 mg by mouth daily.      Marland Kitchen LEVOTHYROXINE SODIUM 25 MCG PO TABS Oral Take 25 mcg by mouth daily.      Marland Kitchen ONE-DAILY MULTI VITAMINS PO TABS Oral Take 1 tablet by mouth daily.      Marland Kitchen RAMIPRIL 10 MG PO TABS Oral Take 10 mg by mouth daily.      . TRIAMTERENE-HCTZ 37.5-25 MG PO TABS Oral Take 1 tablet by mouth daily.        BP 188/90  Pulse 67  Temp 98.5 F (36.9 C) (Oral)  Resp 15  SpO2 98%  Physical Exam  Nursing note and vitals reviewed. Constitutional: He is oriented to person, place, and time. He appears well-developed and well-nourished. No distress.  HENT:  Head: Normocephalic and atraumatic.  Mouth/Throat: Oropharynx is clear and moist.  Eyes: Conjunctivae and EOM are normal. Pupils are equal, round, and reactive to light.  Neck: Normal range of motion. Neck supple.  Cardiovascular: Normal rate and intact distal pulses.  An irregularly irregular rhythm present.  No murmur heard. Pulmonary/Chest: Effort normal and breath sounds normal. No respiratory distress. He has no wheezes. He has no rales.       Healed midline sternotomy scar  Abdominal: Soft. He exhibits no distension. There is no tenderness. There is no rebound and no guarding.  Musculoskeletal: Normal range of motion. He exhibits no edema and no tenderness.  Neurological: He is  alert and oriented to person, place, and time.  Skin: Skin is warm and dry. No rash noted. No erythema.  Psychiatric: He has a normal mood and affect. His behavior is normal.    ED Course  Procedures (including critical care time)  Labs Reviewed  CBC WITH DIFFERENTIAL - Abnormal; Notable for the following:    RBC 4.19 (*)     Hemoglobin 12.4 (*)     HCT 36.8 (*)     Platelets 146 (*)     All other components within normal limits  COMPREHENSIVE METABOLIC PANEL - Abnormal; Notable for the following:    Glucose, Bld 100 (*)     GFR calc non Af Amer 87 (*)     All other components within normal limits  URINALYSIS, ROUTINE W REFLEX MICROSCOPIC - Abnormal; Notable for the following:    Ketones, ur 15 (*)     All other components within normal limits  TROPONIN I   Dg Chest Port 1 View  07/31/2012  *RADIOLOGY REPORT*  Clinical Data: Near-syncope, numbness of the legs, on medication for hypertension, former smoking history  PORTABLE CHEST - 1 VIEW  Comparison: CT chest of 02/26/2011  Findings: The lungs are clear.  Mild cardiomegaly is stable. Median sternotomy sutures are noted from prior CABG.  No bony abnormality is seen.  IMPRESSION: Stable cardiomegaly.  No active lung disease.  Original Report Authenticated By: Joretta Bachelor, M.D.    Date: 07/31/2012  Rate: 75  Rhythm: atrial fibrillation  QRS Axis: normal  Intervals: normal  ST/T Wave abnormalities: normal  Conduction Disutrbances:none  Narrative Interpretation:   Old EKG Reviewed: unchanged    1. Near syncope   2. Atrial fibrillation       MDM   Patient with near syncopal event today when he was walking around at [facility]. Patient denies any chest pain or shortness of breath arrived to his PCP office he was in his normal atrial fibrillation with PCP states his rhythm went down to 30 beats per minute but there is no rhythm strip to confirm. On arrival here patient states he feels much better and symptoms have resolved. EKG  shows atrial fibrillation without any change. CBC, CMP, i-STAT troponin pending. Chest x-Harvy without active abnormalities  3:49 PM All labs are wnl.  Will discuss with cards.      Blanchie Dessert, MD 07/31/12 [place], MD 07/31/12 1558

## 2012-07-31 NOTE — H&P (Signed)
History and Physical   Admit date: 07/31/2012 Name:  Dalton Vaughan Medical record number: [id number] DOB/Age:  76/11/1929  78 y.o. male  Referring Physician:  [facility]  Primary Cardiologist:   Dr. Tollie Eth  Primary Physician:   Dr. Redge Gainer  Chief complaint/reason for admission:   Dizziness  HPI:  This 78 year old male has a history of coronary artery disease with previous bypass grafting but has been active and asymptomatic. His episodic dizziness as well as slow pulse rate noted over the years but has never had definite syncope.  He was in his usual state of health and was out walking in a home improvement store when he had the onset of some dizziness and lightheadedness. This persisted and he did not feel quite well and he felt as if his legs felt rubbery and fell to the could not focus. He noted that his lip felt tingling and he later went to Dr. Tawanna Sat office. There he was found to have somewhat slow heart rate to be sent to the [facility] emergency room. He has not had any chest discomfort suggestive of angina. He did not have shortness of breath and did not have any other neurologic symptoms. He did not have syncope and felt as if things were jittery on the inside.   Past Medical History  Diagnosis Date  . Hyperlipidemia   . Diverticulosis   . GERD (gastroesophageal reflux disease)   . Atrial fibrillation   . Vitreous detachment   . Internal hemorrhoids   . CAD (coronary artery disease)     Dr. Wynonia Lawman  . Hypertensive heart disease without CHF   . History of TIAs   . Lumbar disc disease   . BPH (benign prostatic hypertrophy)      Past Surgical History  Procedure Date  . Bilateral cataract surgery 6/07  . Cysloscopy 8/08  . Prostate biopsy 8/08  . Coronary artery bypass graft 12/99     Dr. Servando Snare   . Tonsillectomy and adenoidectomy   . Hernia repair     right   Allergies: is allergic to penicillins.   Medications: Prior to Admission  medications   Medication Sig Start Date End Date Taking? Authorizing Provider  amLODipine (NORVASC) 5 MG tablet Take 2.5 mg by mouth daily.    Yes Historical Provider, MD  aspirin (SB LOW DOSE ASA EC) 81 MG EC tablet Take 81 mg by mouth daily.     Yes Historical Provider, MD  Calcium Carbonate-Vitamin D (CALCIUM 600 + D PO) Take by mouth.     Yes Historical Provider, MD  Cholecalciferol (VITAMIN D-3) 5000 UNITS TABS Take by mouth.     Yes Historical Provider, MD  Coenzyme Q10 (CO Q 10) 100 MG CAPS Take 1 capsule by mouth daily.   Yes Historical Provider, MD  doxazosin (CARDURA) 8 MG tablet Take 6 mg by mouth at bedtime.    Yes Historical Provider, MD  esomeprazole (NEXIUM) 40 MG capsule Take 40 mg by mouth 2 (two) times daily.     Yes Historical Provider, MD  folic acid (FOLVITE) 1 MG tablet Take 1 mg by mouth daily.     Yes Historical Provider, MD  levothyroxine (SYNTHROID, LEVOTHROID) 25 MCG tablet Take 25 mcg by mouth daily.     Yes Historical Provider, MD  mirtazapine (REMERON) 7.5 MG tablet Take 7.5 mg by mouth at bedtime.   Yes Historical Provider, MD  Multiple Vitamin (MULTIVITAMIN) tablet Take 1 tablet by mouth daily.  Yes Historical Provider, MD  ramipril (ALTACE) 10 MG tablet Take 10 mg by mouth daily.     Yes Historical Provider, MD  rosuvastatin (CRESTOR) 5 MG tablet Take 5 mg by mouth 2 (two) times a week. Takes on Tuesday and Thursday   Yes Historical Provider, MD  triamterene-hydrochlorothiazide (MAXZIDE-25) 37.5-25 MG per tablet Take 1 tablet by mouth daily.     Yes Historical Provider, MD  warfarin (COUMADIN) 5 MG tablet Take 2.5-5 mg by mouth daily. Takes 5 mg on Monday, Wednesday and Friday and 2.5 mg all other days   Yes Historical Provider, MD    Family History:  Family Status  Relation Status Death Age  . Father Deceased 53    died of trauma  . Mother Deceased 66    died of pneumonia  . Brother Alive   . Sister Alive   . Sister Alive     Social History:    reports that he quit smoking about 33 years ago. He has never used smokeless tobacco. He reports that he drinks about .5 ounces of alcohol per week. He reports that he does not use illicit drugs.   History   Social History Narrative   Married.  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 His eyesight is been fine otherwise. He has a history of some anxiety has been under situational stress with his wife. He has occasional depression. He has significant symptoms of prostatism. He has had moderate to arthritis. He has not had any recent digestive issues.  Other than as noted above, the remainder of the review of systems is normal  Physical Exam: BP 188/90  Pulse 67  Temp 98.5 F (36.9 C) (Oral)  Resp 15  SpO2 98% General appearance: alert, cooperative, appears stated age and no distress Head: Normocephalic, without obvious abnormality, atraumatic, balding male hair pattern Eyes: conjunctivae/corneas clear. PERRL, EOM's intact. Fundi benign. Neck: no adenopathy, no carotid bruit, no JVD and supple, symmetrical, trachea midline Lungs: clear to auscultation bilaterally, healed median sternotomy scar Heart: regular rate and rhythm, S1, S2 normal, no murmur, click, rub or gallop Abdomen: soft, non-tender; bowel sounds normal; no masses,  no organomegaly Rectal: deferred Extremities: extremities normal, atraumatic, no cyanosis or edema, previous saphenous harvesting scars noted on right leg Pulses: 2+ and symmetric Skin: Skin color, texture, turgor normal. Occasional sebaceous cysts noted  Labs: CBC  Basename 07/31/12 1404  WBC 6.5  RBC 4.19*  HGB 12.4*  HCT 36.8*  PLT 146*  MCV 87.8  MCH 29.6  MCHC 33.7  RDW 14.9  LYMPHSABS 1.3  MONOABS 0.3  EOSABS 0.0  BASOSABS 0.0   CMP   Basename 07/31/12 1404  NA 139  K 3.8  CL 102  CO2 28  GLUCOSE  100*  BUN 8  CREATININE 0.69  CALCIUM 9.3  PROT 7.0  ALBUMIN 4.1  AST 24  ALT 12  ALKPHOS 97  BILITOT 0.6  GFRNONAA 87*  GFRAA >90   Cardiac Panel (last 3 results)  Basename 07/31/12 1404  CKTOTAL --  CKMB --  TROPONINI <0.30  RELINDX --   EKG: Atrial fibrillation, nonspecific ST changes  Radiology: Cardiomegaly, clear lung fields, previous median sternotomy   IMPRESSIONS: 1. Dizziness and near syncope rule out cardiac arrhythmia or cardiac etiology 2. History of sick sinus syndrome 3. Chronic atrial fibrillation 4. Coronary  artery disease with previous bypass grafting 5. Hypertensive heart disease currently not well controlled 6. History of TIA and stroke without residual 7. History of depression 8. Hyperlipidemia with previous statin intolerance  PLAN: Place on telemetry observation overnight, check echocardiogram, if he does not have severe bradycardia overnight we may discharge him tomorrow to have an outpatient event monitor placed. Monitor orthostatic blood pressures and monitor his blood pressure.  Signed: Kerry Hough MD [facility]  07/31/2012, 5:00 PM

## 2012-07-31 NOTE — ED Notes (Signed)
Patient had been working on deck and then went to [employer].  He felt weak with tingling in his lips.  He denies any pain on arrival to ED.  Has of history of a-fib.

## 2012-08-01 DIAGNOSIS — I4891 Unspecified atrial fibrillation: Secondary | ICD-10-CM | POA: Diagnosis not present

## 2012-08-01 DIAGNOSIS — E785 Hyperlipidemia, unspecified: Secondary | ICD-10-CM | POA: Diagnosis not present

## 2012-08-01 DIAGNOSIS — I119 Hypertensive heart disease without heart failure: Secondary | ICD-10-CM | POA: Diagnosis not present

## 2012-08-01 DIAGNOSIS — R55 Syncope and collapse: Secondary | ICD-10-CM | POA: Diagnosis not present

## 2012-08-01 DIAGNOSIS — I2510 Atherosclerotic heart disease of native coronary artery without angina pectoris: Secondary | ICD-10-CM | POA: Diagnosis not present

## 2012-08-01 DIAGNOSIS — Z7901 Long term (current) use of anticoagulants: Secondary | ICD-10-CM | POA: Diagnosis not present

## 2012-08-01 LAB — PROTIME-INR
INR: 1.79 — ABNORMAL HIGH (ref 0.00–1.49)
Prothrombin Time: 21.1 seconds — ABNORMAL HIGH (ref 11.6–15.2)

## 2012-08-01 LAB — TSH: TSH: 1.542 u[IU]/mL (ref 0.350–4.500)

## 2012-08-01 MED ORDER — AMLODIPINE BESYLATE 5 MG PO TABS: 5.0000 mg | ORAL_TABLET | Freq: Every day | ORAL | Status: DC

## 2012-08-01 NOTE — Progress Notes (Signed)
Correction to previous note. The patient experienced not a 9 sec pause, but a 1.9 sec pause. Dalton Vaughan

## 2012-08-01 NOTE — Progress Notes (Signed)
Utilization review complete 

## 2012-08-01 NOTE — Progress Notes (Signed)
Subjective:  NO recurrent dizziness over night.  He is in atrial fibrillation and has rates slow at times, but not sustained.  No pauses greater than 2.5 seconds.  Nurse noted "Nine second pause".  Strips reviewed and none seen.  I think this was documentation error.  Objective:  Vital Signs in the last 24 hours: BP 147/82  Pulse 61  Temp 98.3 F (36.8 C) (Oral)  Resp 18  Ht 6' (1.829 m)  Wt 76 kg (167 lb 8.8 oz)  BMI 22.72 kg/m2  SpO2 93%  Physical Exam: Pleasant WM in NAD Lungs:  Clear Cardiac:  Regular rhythm, normal S1 and S2, no S3 Extremities:  No edema present  Intake/Output from previous day: 08/15 0701 - 08/16 0700 In: 3 [I.V.:3] Out: 700 [Urine:700] Weight Filed Weights   07/31/12 1857 08/01/12 0326  Weight: 76.4 kg (168 lb 6.9 oz) 76 kg (167 lb 8.8 oz)    Lab Results: Basic Metabolic Panel:  Basename 07/31/12 1404  NA 139  K 3.8  CL 102  CO2 28  GLUCOSE 100*  BUN 8  CREATININE 0.69   CBC:  Basename 07/31/12 1727 07/31/12 1404  WBC 7.0 6.5  NEUTROABS -- 4.8  HGB 12.4* 12.4*  HCT 37.2* 36.8*  MCV 87.7 87.8  PLT 151 146*   PROTIME: Lab Results  Component Value Date   INR 1.79* 08/01/2012   INR 1.66* 07/31/2012   INR 1.65* 07/05/2012    Telemetry: Reviewed atrial fib with greatest pause 2.5 seconds.  Assessment/Plan:  Atrial fib with sick sinus syndrome Dizziness resolved Hypertension a little better controlled  Rec:  Await ECHO.  Monitor and walk.  If stable home later today with home event monitor.     Kerry Hough  MD [facility] Cardiology  08/01/2012, 8:29 AM

## 2012-08-01 NOTE — Progress Notes (Signed)
Monitor tech informs the nurse that the patient experienced 9 sec pauses. The nurse checked in on the patient and the patient was asymptomatic. Dalton Vaughan

## 2012-08-01 NOTE — Progress Notes (Signed)
Pt ambulated the unit without problem.

## 2012-08-01 NOTE — Progress Notes (Signed)
  Echocardiogram 2D Echocardiogram has been performed.  Dalton Vaughan 08/01/2012, 9:48 AM

## 2012-08-01 NOTE — Care Management Note (Signed)
    Page 1 of 1   08/01/2012     4:14:44 PM   CARE MANAGEMENT NOTE 08/01/2012  Patient:  Dalton Vaughan, Dalton Vaughan   Account Number:  [id number]  Date Initiated:  08/01/2012  Documentation initiated by:  Jlynn Ly  Subjective/Objective Assessment:   PT ADM WITH NEAR SYNCOPAL EPISODE.  PTA, PT INDEPENDENT, LIVES WITH SPOUSE.     Action/Plan:   MET WITH PT AND SPOUSE TO DISCUSS DC PLANS.  WIFE TO PROVIDE CARE AT DC.  DENIES HOME NEEDS.   Anticipated DC Date:  08/01/2012   Anticipated DC Plan:  HOME/SELF CARE         Choice offered to / List presented to:             Status of service:  Completed, signed off Medicare Important Message given?   (If response is "NO", the following Medicare IM given date fields will be blank) Date Medicare IM given:   Date Additional Medicare IM given:    Discharge Disposition:  HOME/SELF CARE  Per UR Regulation:  Reviewed for med. necessity/level of care/duration of stay  If discussed at [place] of Stay Meetings, dates discussed:    Comments:

## 2012-08-01 NOTE — Discharge Summary (Signed)
Physician Discharge Summary  Patient ID: Dalton Vaughan MRN: [id number] DOB/AGE: Nov 19, 1929 78 y.o.  Admit date: 07/31/2012 Discharge date: 08/01/2012  Primary Physician: Dr. Redge Gainer  Primary Discharge Diagnosis:  1. Dizziness and near syncope of uncertain etiology-no severe bradycardia noted  Secondary Discharge Diagnosis: 2. Coronary artery disease with previous bypass grafting 3. Hypertensive heart disease 4. Hyperlipidemia under treatment 5. Long-term anticoagulation with warfarin 6. Chronic atrial fibrillation 7. Sick sinus syndrome  Procedures: Echocardiogram  Hospital Course: This 78 year old male has a history of coronary artery disease with previous bypass grafting but has been active and asymptomatic. His episodic dizziness as well as slow pulse rate noted over the years but has never had definite syncope.  He was in his usual state of health and was out walking in a home improvement store when he had the onset of some dizziness and lightheadedness. This persisted and he did not feel quite well and he felt as if his legs felt rubbery and fell to the could not focus. He noted that his lip felt tingling and he later went to Dr. Tawanna Sat office. There he was found to have somewhat slow heart rate to be sent to the [facility] emergency room. He has not had any chest discomfort suggestive of angina. He did not have shortness of breath and did not have any other neurologic symptoms. He did not have syncope and felt as if things were jittery on the inside.  He presented to the emergency room and was found to have some mild bradycardia on arrival. He was taken to a telemetry floor his blood pressures were monitored. His amlodipine was increased to 5 mg daily and he had better blood pressure control. He was monitored overnight and the longest pause to be seen was 2.5 seconds. He did not have severe bradycardia and his dizziness resolved. His echocardiogram shows LVH with preserved  systolic function and moderate to marked left atrial enlargement. He was ambulatory in the [place] the next day without recurrent symptoms and was discharged home after observation to have an outpatient event monitor placed.  Discharge Exam: Blood pressure 147/82, pulse 61, temperature 98.3 F (36.8 C), temperature source Oral, resp. rate 18, height 6' (1.829 m), weight 76 kg (167 lb 8.8 oz), SpO2 93.00%.   Lungs clear, no S3  Labs: CBC:   Lab Results  Component Value Date   WBC 7.0 07/31/2012   HGB 12.4* 07/31/2012   HCT 37.2* 07/31/2012   MCV 87.7 07/31/2012   PLT 151 07/31/2012   CMP:  Lab 07/31/12 1404  NA 139  K 3.8  CL 102  CO2 28  BUN 8  CREATININE 0.69  CALCIUM 9.3  PROT 7.0  BILITOT 0.6  ALKPHOS 97  ALT 12  AST 24  GLUCOSE 100*   Cardiac Enzymes:  Basename 07/31/12 1404  CKTOTAL --  CKMB --  CKMBINDEX --  TROPONINI <0.30   Protime: Lab Results  Component Value Date   INR 1.79* 08/01/2012   INR 1.66* 07/31/2012   INR 1.65* 07/05/2012     Radiology: Stable cardiomegaly, no active lung disease  EKG: Atrial fibrillation with somewhat slow ventricular response  Discharge Medications:  [place], [place]  Home Medication Instructions [id number]   Printed on:08/01/12 1343  Medication Information                    ramipril (ALTACE) 10 MG tablet Take 10 mg by mouth daily.  triamterene-hydrochlorothiazide (MAXZIDE-25) 37.5-25 MG per tablet Take 1 tablet by mouth daily.             folic acid (FOLVITE) 1 MG tablet Take 1 mg by mouth daily.             aspirin (SB LOW DOSE ASA EC) 81 MG EC tablet Take 81 mg by mouth daily.             Calcium Carbonate-Vitamin D (CALCIUM 600 + D PO) Take by mouth.             Multiple Vitamin (MULTIVITAMIN) tablet Take 1 tablet by mouth daily.             esomeprazole (NEXIUM) 40 MG capsule Take 40 mg by mouth 2 (two) times daily.             levothyroxine (SYNTHROID, LEVOTHROID) 25 MCG tablet Take 25 mcg  by mouth daily.             Cholecalciferol (VITAMIN D-3) 5000 UNITS TABS Take by mouth.             doxazosin (CARDURA) 8 MG tablet Take 6 mg by mouth at bedtime.            rosuvastatin (CRESTOR) 5 MG tablet Take 5 mg by mouth 2 (two) times a week. Takes on Tuesday and Thursday           mirtazapine (REMERON) 7.5 MG tablet Take 7.5 mg by mouth at bedtime.           warfarin (COUMADIN) 5 MG tablet Take 2.5-5 mg by mouth daily. Takes 5 mg on Monday, Wednesday and Friday and 2.5 mg all other days           Coenzyme Q10 (CO Q 10) 100 MG CAPS Take 1 capsule by mouth daily.           amLODipine (NORVASC) 5 MG tablet Take 1 tablet (5 mg total) by mouth daily.             Followup plans and appointments: Come by the office today  to get a cardiac event monitor, return visit in 2 weeks    Time spent with patient to include physician time: 45 minutes  Signed: W. Doristine Church. MD [facility] 08/01/2012, 1:43 PM

## 2012-08-06 ENCOUNTER — Telehealth: Payer: Self-pay | Admitting: Oncology

## 2012-08-06 NOTE — Telephone Encounter (Signed)
Delivered chart on 8/21 for appt on 9/4 with Dr. Beryle Beams

## 2012-08-19 ENCOUNTER — Other Ambulatory Visit: Payer: Self-pay | Admitting: Oncology

## 2012-08-19 DIAGNOSIS — D689 Coagulation defect, unspecified: Secondary | ICD-10-CM

## 2012-08-20 ENCOUNTER — Ambulatory Visit (HOSPITAL_BASED_OUTPATIENT_CLINIC_OR_DEPARTMENT_OTHER): Payer: Medicare Other | Admitting: Oncology

## 2012-08-20 ENCOUNTER — Encounter: Payer: Self-pay | Admitting: Oncology

## 2012-08-20 ENCOUNTER — Encounter: Payer: Medicare Other | Admitting: Oncology

## 2012-08-20 ENCOUNTER — Other Ambulatory Visit (HOSPITAL_BASED_OUTPATIENT_CLINIC_OR_DEPARTMENT_OTHER): Payer: Medicare Other | Admitting: Lab

## 2012-08-20 ENCOUNTER — Ambulatory Visit: Payer: Medicare Other

## 2012-08-20 VITALS — BP 161/88 | HR 68 | Temp 97.9°F | Resp 18 | Ht 72.0 in | Wt 172.7 lb

## 2012-08-20 DIAGNOSIS — D689 Coagulation defect, unspecified: Secondary | ICD-10-CM | POA: Diagnosis not present

## 2012-08-20 DIAGNOSIS — K1370 Unspecified lesions of oral mucosa: Secondary | ICD-10-CM

## 2012-08-20 DIAGNOSIS — K1379 Other lesions of oral mucosa: Secondary | ICD-10-CM

## 2012-08-20 HISTORY — DX: Other lesions of oral mucosa: K13.79

## 2012-08-20 LAB — MORPHOLOGY: PLT EST: ADEQUATE

## 2012-08-20 LAB — CBC & DIFF AND RETIC
BASO%: 0 % (ref 0.0–2.0)
Basophils Absolute: 0 10*3/uL (ref 0.0–0.1)
EOS%: 2.4 % (ref 0.0–7.0)
Eosinophils Absolute: 0.1 10*3/uL (ref 0.0–0.5)
HCT: 37.7 % — ABNORMAL LOW (ref 38.4–49.9)
HGB: 12.1 g/dL — ABNORMAL LOW (ref 13.0–17.1)
Immature Retic Fract: 5.7 % (ref 3.00–10.60)
LYMPH%: 41.9 % (ref 14.0–49.0)
MCH: 28.4 pg (ref 27.2–33.4)
MCHC: 32.1 g/dL (ref 32.0–36.0)
MCV: 88.5 fL (ref 79.3–98.0)
MONO#: 0.4 10*3/uL (ref 0.1–0.9)
MONO%: 6.9 % (ref 0.0–14.0)
NEUT#: 2.5 10*3/uL (ref 1.5–6.5)
NEUT%: 48.8 % (ref 39.0–75.0)
Platelets: 160 10*3/uL (ref 140–400)
RBC: 4.26 10*6/uL (ref 4.20–5.82)
RDW: 15.3 % — ABNORMAL HIGH (ref 11.0–14.6)
Retic %: 0.6 % — ABNORMAL LOW (ref 0.80–1.80)
Retic Ct Abs: 25.56 10*3/uL — ABNORMAL LOW (ref 34.80–93.90)
WBC: 5.1 10*3/uL (ref 4.0–10.3)
lymph#: 2.1 10*3/uL (ref 0.9–3.3)

## 2012-08-20 LAB — CHCC SMEAR

## 2012-08-20 LAB — PROTHROMBIN TIME
INR: 1.72 — ABNORMAL HIGH (ref <1.50)
Prothrombin Time: 20.5 seconds — ABNORMAL HIGH (ref 11.6–15.2)

## 2012-08-20 LAB — APTT: aPTT: 40.7 seconds — ABNORMAL HIGH (ref 24–37)

## 2012-08-20 NOTE — Progress Notes (Signed)
New Patient Hematology-Oncology Evaluation   Dalton Vaughan [id number] 30-Dec-1928 78 y.o. 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 He underwent uncomplicated coronary bypass surgery in 1999 by Dr. Servando Snare. Only other major surgery was spinal surgery for a herniated disc in 1995. He did not have any excessive bleeding after these procedures. He has been on low-dose Coumadin for the last 4 years due to chronic atrial fibrillation. He is also on 81 mg aspirin tablet daily. I was called by his dentist last month. After a upper right molar tooth was extracted the patient developed uncontrolled bleeding 3 days after the procedure. He was evaluated in the emergency department on July 19. He spent the night there. Pre-extraction INR was low therapeutic per patient history. INR done in the emergency department was subtherapeutic 1.65 recorded on July 20. Despite this, he was given fresh frozen plasma. Baseline hemoglobin was 13.6 g and platelet 167,000. He did not stop either his low-dose aspirin or Coumadin prior to the dental extraction. He was treated with application of lidocaine and epinephrine and a hemeCon dressing was applied. Bleeding not noted to subside until after he received the plasma. When he removed the gauze the next day, he started to bleed again and followed up with Dr. Romie Minus. Additional local therapy was applied [place]? And the bleeding stopped. The socket has now healed. He has had no subsequent bleeding problems.  He admits to easy bruising.  He has not seen any other areas of bleeding other than from the oropharynx. He has no history of any liver problems. No prior history of hepatitis. Liver functions as recently as August 15 of this year are  normal. He has no known valvular heart disease which might in some instances be associated with acquired von Willebrand's disorder. He does not take any additional aspirin other than the 81 mg dose and does not use any nonsteroidal anti-inflammatory drugs.  He drinks a rare glass of wine.  There is no family history of any bleeding disorder. Mother lived until age 67. Father was killed. He has 2 sisters and a brother without bleeding problems and a healthy son.  PMH: Past Medical History  Diagnosis Date  . Hyperlipidemia   . Diverticulosis   . GERD (gastroesophageal reflux disease)   . Atrial fibrillation   . Vitreous detachment   . Internal hemorrhoids   . CAD (coronary artery disease)     Dr. Wynonia Lawman  . Hypertensive heart disease without CHF   . History of TIAs   . Lumbar disc disease   . BPH (benign prostatic hypertrophy)   He denies MI, ulcers, diabetes, emphysema, asthma, tuberculosis, hepatitis, yellow jaundice, malaria, he is hypothyroid on replacement for the last 3 or 4 years, no kidney disease, no history of seizure or stroke. No prior blood clots.  Past Surgical History  Procedure Date  . Bilateral cataract surgery 6/07  . Cysloscopy 8/08  . Prostate biopsy 8/08  . Coronary artery bypass graft 12/99     Dr. Servando Snare   . Tonsillectomy and adenoidectomy   . Hernia repair     right    Allergies: Allergies  Allergen Reactions  . Penicillins     Said he is  allergic"    Medications: He Norvasc 5 mg tablet 2.5  mg equals one half tablet twice a day; enteric-coated aspirin 81 mg daily, Cardura 6 mg at bedtime, Nexium 40 mg at bedtime, folic acid 1 mg daily, Synthroid 25 mcg daily, Remeron 7.5 mg at bedtime when necessary sleep, multivitamins and calcium supplements daily, Altace 10 mg daily, Crestor 5 mg twice weekly, Maxide 37.5-25 one daily, Coumadin 5 mg 5 mg on Monday Wednesday Friday 2.5 mg on the other days of the week   Social History:   reports that he quit  smoking about 33 years ago. He has never used smokeless tobacco. He reports that he drinks about .5 ounces of alcohol per week. He reports that he does not use illicit drugs.  Family History: See history of present illness  Review of Systems: Constitutional symptoms: No constitutional symptoms HEENT: See history of present illness Respiratory: No cough or dyspnea Cardiovascular:  No chest pain or palpitations Gastrointestinal ROS: No change in bowel habit Genito-Urinary ROS: No urinary tract symptoms Hematological and Lymphatic: Musculoskeletal: Mild arthritis in his hands Neurologic: No headache or change in vision Dermatologic: No rash. Easy bruising. Remaining ROS negative.  Physical Exam: Blood pressure 161/88, pulse 68, temperature 97.9 F (36.6 C), temperature source Oral, resp. rate 18, height 6' (1.829 m), weight 172 lb 11.2 oz (78.336 kg). Wt Readings from Last 3 Encounters:  08/20/12 172 lb 11.2 oz (78.336 kg)  08/01/12 167 lb 8.8 oz (76 kg)  07/05/12 175 lb (79.379 kg)    General appearance: Thin, adequately nourished, Caucasian man Head: Normal. Oropharynx: Healed socket  upper right molar area. No oropharyngeal bleeding. Neck: Full range of motion Lymph nodes: No lymphadenopathy Breasts: Lungs: Clear to auscultation resonant to percussion Heart: Regular rhythm no murmur Abdominal: Soft nontender no mass no organomegaly GU: Not examined Extremities: No edema no calf tenderness. Neurologic: Pupils equal reactive to light optic discs sharp vessels normal no hemorrhage or exudate. Motor strength 5 over 5. Reflexes 1+ symmetric. Vascular: Carotids 2+ no bruits Skin: Thin skin and senile purpura on the dorsa of his hands, no other areas of ecchymosis. No hemangiomas.    Lab Results: Lab Results  Component Value Date   WBC 5.1 08/20/2012   HGB 12.1* 08/20/2012   HCT 37.7* 08/20/2012   MCV 88.5 08/20/2012   PLT 160 08/20/2012     Chemistry      Component Value  Date/Time   NA 139 07/31/2012 1404   K 3.8 07/31/2012 1404   CL 102 07/31/2012 1404   CO2 28 07/31/2012 1404   BUN 8 07/31/2012 1404   CREATININE 0.69 07/31/2012 1404      Component Value Date/Time   CALCIUM 9.3 07/31/2012 1404   ALKPHOS 97 07/31/2012 1404   AST 24 07/31/2012 1404   ALT 12 07/31/2012 1404   BILITOT 0.6 07/31/2012 1404    Pro time 20.5 seconds INR 1.7   PTT 40.7 seconds left and control up to 37 seconds   Review of peripheral blood film: Normochromic normocytic red cells, mature white cells, increased population of benign reactive lymphocytes, platelets appear normal in morphology and granulation.     Impression and Plan: Transient, persistent bleeding following a molar dental extraction in an elderly man on low-dose aspirin and Coumadin which is now completely resolved.  I find no clinical or laboratory evidence for an underlying coagulopathy. PTT is slightly elevated and this is consistent with the Coumadin effect. There is no suspicion that he has an inhibitor to coagulation. PTT would be much higher in this  situation. There is no suspicion for a hard von Willebrand's disorder at this time.  He has had major surgery in the past including coronary bypass surgery without excessive bleeding. He was on either Coumadin or aspirin at time of these surgeries. He has normal liver function.  I don't think any of his medications need to be changed. I did not schedule a followup visit but I will be happy to see him again in the future if problems develop.      Annia Belt, MD 08/20/2012, 1:05 PM

## 2012-08-21 ENCOUNTER — Encounter: Payer: Self-pay | Admitting: Internal Medicine

## 2012-08-21 DIAGNOSIS — I4891 Unspecified atrial fibrillation: Secondary | ICD-10-CM | POA: Diagnosis not present

## 2012-08-22 DIAGNOSIS — I2510 Atherosclerotic heart disease of native coronary artery without angina pectoris: Secondary | ICD-10-CM | POA: Diagnosis not present

## 2012-08-22 DIAGNOSIS — R55 Syncope and collapse: Secondary | ICD-10-CM | POA: Diagnosis not present

## 2012-08-22 DIAGNOSIS — I119 Hypertensive heart disease without heart failure: Secondary | ICD-10-CM | POA: Diagnosis not present

## 2012-08-22 DIAGNOSIS — I4891 Unspecified atrial fibrillation: Secondary | ICD-10-CM | POA: Diagnosis not present

## 2012-08-22 DIAGNOSIS — I495 Sick sinus syndrome: Secondary | ICD-10-CM | POA: Diagnosis not present

## 2012-08-22 DIAGNOSIS — Z7901 Long term (current) use of anticoagulants: Secondary | ICD-10-CM | POA: Diagnosis not present

## 2012-08-22 DIAGNOSIS — E785 Hyperlipidemia, unspecified: Secondary | ICD-10-CM | POA: Diagnosis not present

## 2012-08-25 ENCOUNTER — Institutional Professional Consult (permissible substitution): Payer: Medicare Other | Admitting: Internal Medicine

## 2012-08-25 LAB — CARDIOLIPIN ANTIBODIES, IGG, IGM, IGA
Anticardiolipin IgA: 7 APL U/mL (ref <22)
Anticardiolipin IgG: 14 GPL U/mL (ref <23)
Anticardiolipin IgM: 5 MPL U/mL (ref <11)

## 2012-08-25 LAB — BETA-2 GLYCOPROTEIN ANTIBODIES
Beta-2 Glyco I IgG: 0 G Units (ref <20)
Beta-2-Glycoprotein I IgA: 5 A Units (ref <20)
Beta-2-Glycoprotein I IgM: 3 M Units (ref <20)

## 2012-08-25 LAB — LUPUS ANTICOAGULANT PANEL
DRVVT 1:1 Mix: 38.9 secs (ref <45.1)
DRVVT: 55.5 secs — ABNORMAL HIGH (ref <45.1)
Lupus Anticoagulant: NOT DETECTED
PTT Lupus Anticoagulant: 39.5 secs (ref 28.0–43.0)

## 2012-08-26 ENCOUNTER — Encounter: Payer: Self-pay | Admitting: Internal Medicine

## 2012-08-26 ENCOUNTER — Ambulatory Visit (INDEPENDENT_AMBULATORY_CARE_PROVIDER_SITE_OTHER): Payer: Medicare Other | Admitting: Internal Medicine

## 2012-08-26 VITALS — BP 147/89 | HR 67 | Ht 72.0 in | Wt 170.8 lb

## 2012-08-26 DIAGNOSIS — I495 Sick sinus syndrome: Secondary | ICD-10-CM | POA: Diagnosis not present

## 2012-08-26 DIAGNOSIS — R55 Syncope and collapse: Secondary | ICD-10-CM

## 2012-08-26 NOTE — Assessment & Plan Note (Signed)
The patient has multiple pauses on his event recorder. All but 3 of the 49 episodes occur during his typical sleeping hours; there were 3 that occurred between 8:30 PM and 11 PM one of which was noted to be sleeping and the other of which his wife thinks he was probably sleeping in front of the television. Notably there is non-during waking hours were reviewed which she had some of his typical episodes of dizziness, both both associated with gas as well as the instantaneous once. No bradycardia was noted during the daytime. Hence it is not clear to me that pacing is indicated for relief of his symptoms. He has good exercise tolerance is I don't think he has chronotropic incompetence and not withstanding his resting bradycardia. In the event that he is not entirely clear that the symptoms occur during the monitoring period, an implantable loop recorder if helpful.   The fact that the spells occur at night that he has significant daytime somnolence raises the possibility that he has obstructive sleep apnea heretofore not diagnosed. Perhaps a sleep study would be helpful. He does not have significant daytime fatigue however is on making him feel better is not likely going to be achieved

## 2012-08-26 NOTE — Assessment & Plan Note (Signed)
As above.

## 2012-08-26 NOTE — Progress Notes (Signed)
ELECTROPHYSIOLOGY CONSULT NOTE  Patient ID: Dalton Vaughan, MRN: [id number], DOB/AGE: 1929/06/11 78 y.o. Admit date: (Not on file) Date of Consult: 08/26/2012  Primary Physician: Redge Gainer, MD Primary Cardiologist: [facility]  Chief Complaint: dizzy spells    HPI Dalton Vaughan is a 78 y.o. male seen at the request of Dr. Martin Majestic T for spells of dizziness and presyncope.  He has a history of coronary artery disease with prior bypass grafting in 1999.Marland Kitchen Last Myoview may 2011 demonstrated no ischemia and normal left ventricular.function  He has a history of atrial fibrillation-permanent been complicated by a CVA without deficits. We current TIA. He is on Coumadin.  He describes 3 separate dizzy spells the first is associated with changes in position and is consistent with orthostatic intolerance. The second which is what prompted the monitor he describes as being associated with gas and a greater sense of his atrial fibrillation. This can last minutes to hours. On the most recent occasion of note, he was unable to finish his purchases at both hardware. He went to see Dr. Laurance Flatten and there is a comment that "his heart rate was slow" in the hospital records. He was hospitalized and monitored overnight and nothing was detected. An echocardiogram at that time demonstrated normal left ventricular function with left ventricular hypertrophy.  Because of the aforementioned spell he was given an outpatient monitor which demonstrates multiple greater than 3 second pauses, one lasted up to 3.9 seconds. As noted further below, all of these occurred at night. The 3 that occurred before late night were associated with his sleeping either in bed or from the television.  The third type of dizzy spells is characterized by very brief, almost instantaneous, lightheadedness. This can occur while seated or standing.  He was given an event recorder which is described fully below.  He denies exercise intolerance. He has no  peripheral edema nocturnal dyspnea orthopnea.  he's had no chest pains.  While he does not know if he snores, his wife thinks he might some although he did not share bed room. He does have significant daytime somnolence and falls asleep quickly in a chair.   Past Medical History  Diagnosis Date  . Hyperlipidemia   . Diverticulosis   . GERD (gastroesophageal reflux disease)   . Atrial fibrillation   . Vitreous detachment   . Internal hemorrhoids   . CAD (coronary artery disease)     Dr. Wynonia Lawman  . Hypertensive heart disease without CHF   . History of TIAs   . Lumbar disc disease   . BPH (benign prostatic hypertrophy)   . Oral bleeding 08/20/2012    Following molar tooth extraction July, 2013      Surgical History:  Past Surgical History  Procedure Date  . Bilateral cataract surgery 6/07  . Cysloscopy 8/08  . Prostate biopsy 8/08  . Coronary artery bypass graft 12/99     Dr. Servando Snare   . Tonsillectomy and adenoidectomy   . Hernia repair     right     Home Meds: Prior to Admission medications   Medication Sig Start Date End Date Taking? Authorizing Provider  amLODipine (NORVASC) 5 MG tablet Take 2.5 mg by mouth 2 (two) times daily. 08/01/12  Yes Jacolyn Reedy, MD  aspirin (SB LOW DOSE ASA EC) 81 MG EC tablet Take 81 mg by mouth daily.     Yes Historical Provider, MD  Calcium Carbonate-Vitamin D (CALCIUM 600 + D PO) Take 1 tablet by mouth daily.  Yes Historical Provider, MD  Cholecalciferol (VITAMIN D-3) 5000 UNITS TABS Take 1 tablet by mouth daily.    Yes Historical Provider, MD  Coenzyme Q10 (CO Q 10) 100 MG CAPS Take 1 capsule by mouth daily.   Yes Historical Provider, MD  doxazosin (CARDURA) 8 MG tablet Take 6 mg by mouth at bedtime.    Yes Historical Provider, MD  esomeprazole (NEXIUM) 40 MG capsule Take 40 mg by mouth daily.    Yes Historical Provider, MD  Ferrous Sulfate (IRON) 325 (65 FE) MG TABS Take 1 tablet by mouth daily.   Yes Jacolyn Reedy, MD  folic acid  (FOLVITE) 1 MG tablet Take 1 mg by mouth daily.     Yes Historical Provider, MD  levothyroxine (SYNTHROID, LEVOTHROID) 25 MCG tablet Take 25 mcg by mouth daily.     Yes Historical Provider, MD  mirtazapine (REMERON) 7.5 MG tablet Take 7.5 mg by mouth at bedtime.   Yes Historical Provider, MD  Multiple Vitamin (MULTIVITAMIN) tablet Take 1 tablet by mouth daily.     Yes Historical Provider, MD  nitroGLYCERIN (NITROSTAT) 0.4 MG SL tablet Place 0.4 mg under the tongue every 5 (five) minutes as needed.   Yes Historical Provider, MD  ramipril (ALTACE) 10 MG tablet Take 10 mg by mouth daily.     Yes Historical Provider, MD  rosuvastatin (CRESTOR) 5 MG tablet Take 5 mg by mouth 2 (two) times a week. Takes on Tuesday and Thursday   Yes Historical Provider, MD  triamterene-hydrochlorothiazide (MAXZIDE-25) 37.5-25 MG per tablet Take 1 tablet by mouth daily.     Yes Historical Provider, MD  vitamin C (ASCORBIC ACID) 500 MG tablet Take 500 mg by mouth daily. Take 1/4 tablet by mouth daily.   Yes Historical Provider, MD  warfarin (COUMADIN) 5 MG tablet Take 2.5-5 mg by mouth daily. Takes 5 mg on Monday, Wednesday and Friday and 2.5 mg all other days   Yes Historical Provider, MD      Allergies:  Allergies  Allergen Reactions  . Penicillins     Said he is  Allergic" Natural combinations-extended release  . Pravachol (Pravastatin Sodium)   . Zetia (Ezetimibe)     myaalgias    History   Social History  . Marital Status: Married    Spouse Name: N/A    Number of Children: N/A  . Years of Education: N/A   Occupational History  . Not on file.   Social History Main Topics  . Smoking status: Former Smoker    Quit date: 08/01/1979  . Smokeless tobacco: Never Used  . Alcohol Use: 0.5 oz/week    1 drink(s) per week  . Drug Use: No  . Sexually Active: Not on file   Other Topics Concern  . Not on file   Social History Narrative   Married.  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   ROS:  Please see the history of present illness.     All other systems reviewed and negative.    Physical Exam:  Blood pressure 147/89, pulse 67, height 6' (1.829 m), weight 170 lb 12.8 oz (77.474 kg). General: Well developed, well nourished male in no acute distress. Head: Normocephalic, atraumatic, sclera non-icteric, no xanthomas, nares are without discharge. Lymph Nodes:  none Back: without scoliosis/kyphosis, no CVA tendersness Neck: Negative for carotid bruits. JVD not elevated. Lungs: Clear bilaterally to auscultation without wheezes, rales, or rhonchi.  Breathing is unlabored. Heart:  irregularly irregular rhythm with a normal S1-S2 there is an early systolic murmur  appreciated. Abdomen: Soft, non-tender, non-distended with normoactive bowel sounds. No hepatomegaly. No rebound/guarding. No obvious abdominal masses. Msk:  Strength and tone appear normal for age. Extremities: No clubbing or cyanosis. No  edema.  Distal pedal pulses are 2+ and equal bilaterally. Skin: Warm and Dry Neuro: Alert and oriented X 3. CN III-XII intact Grossly normal sensory and motor function . Psych:  Responds to questions appropriately with a normal affect.      Labs: Cardiac Enzymes No results found for this basename: CKTOTAL:4,CKMB:4,TROPONINI:4 in the last 72 hours CBC Lab Results  Component Value Date   WBC 5.1 08/20/2012   HGB 12.1* 08/20/2012   HCT 37.7* 08/20/2012   MCV 88.5 08/20/2012   PLT 160 08/20/2012      EKG: Atrial fibrillation at 58 Intervals-/10/43   Assessment and Plan Dalton Vaughan

## 2012-08-27 ENCOUNTER — Institutional Professional Consult (permissible substitution): Payer: Medicare Other | Admitting: Internal Medicine

## 2012-09-01 DIAGNOSIS — I10 Essential (primary) hypertension: Secondary | ICD-10-CM | POA: Diagnosis not present

## 2012-09-01 DIAGNOSIS — E039 Hypothyroidism, unspecified: Secondary | ICD-10-CM | POA: Diagnosis not present

## 2012-09-08 DIAGNOSIS — E039 Hypothyroidism, unspecified: Secondary | ICD-10-CM | POA: Diagnosis not present

## 2012-09-08 DIAGNOSIS — I2510 Atherosclerotic heart disease of native coronary artery without angina pectoris: Secondary | ICD-10-CM | POA: Diagnosis not present

## 2012-09-08 DIAGNOSIS — I10 Essential (primary) hypertension: Secondary | ICD-10-CM | POA: Diagnosis not present

## 2012-09-08 DIAGNOSIS — E559 Vitamin D deficiency, unspecified: Secondary | ICD-10-CM | POA: Diagnosis not present

## 2012-09-08 DIAGNOSIS — E785 Hyperlipidemia, unspecified: Secondary | ICD-10-CM | POA: Diagnosis not present

## 2012-09-10 ENCOUNTER — Telehealth: Payer: Self-pay | Admitting: *Deleted

## 2012-09-10 NOTE — Telephone Encounter (Signed)
Message copied by Ignacia Felling on Wed Sep 10, 2012 10:37 AM ------      Message from: Dalton Vaughan      Created: Tue Sep 09, 2012 10:02 AM       Call patient - special blood tests done here on 9/4 all normal

## 2012-09-10 NOTE — Telephone Encounter (Signed)
Spoke with patient and let him know that the special blood tests done here on 9/4 were all normal.  He appreciated the call.

## 2012-09-18 ENCOUNTER — Ambulatory Visit (HOSPITAL_BASED_OUTPATIENT_CLINIC_OR_DEPARTMENT_OTHER): Payer: Medicare Other | Attending: Cardiology

## 2012-09-18 VITALS — Ht 72.0 in | Wt 170.0 lb

## 2012-09-18 DIAGNOSIS — G4733 Obstructive sleep apnea (adult) (pediatric): Secondary | ICD-10-CM

## 2012-09-18 DIAGNOSIS — Z23 Encounter for immunization: Secondary | ICD-10-CM | POA: Diagnosis not present

## 2012-09-18 DIAGNOSIS — I4891 Unspecified atrial fibrillation: Secondary | ICD-10-CM | POA: Diagnosis not present

## 2012-09-27 DIAGNOSIS — G4733 Obstructive sleep apnea (adult) (pediatric): Secondary | ICD-10-CM | POA: Diagnosis not present

## 2012-09-27 DIAGNOSIS — R0989 Other specified symptoms and signs involving the circulatory and respiratory systems: Secondary | ICD-10-CM | POA: Diagnosis not present

## 2012-09-27 DIAGNOSIS — R0609 Other forms of dyspnea: Secondary | ICD-10-CM | POA: Diagnosis not present

## 2012-09-27 NOTE — Procedures (Signed)
Dalton Vaughan, SEE                 ACCOUNT NO.:  [id number]  MEDICAL RECORD NO.:  [id number]          PATIENT TYPE:  OUT  LOCATION:  SLEEP CENTER                 FACILITY:  [facility]  PHYSICIAN:  Jesusita Jocelyn D. Annamaria Boots, MD, FCCP, FACPDATE OF BIRTH:  Apr 05[birth year] 1931  DATE OF STUDY:  09/18/2012                           NOCTURNAL POLYSOMNOGRAM  REFERRING PHYSICIAN:  Jacolyn Reedy  REFERRING PHYSICIAN:  Ezzard Standing, MD  INDICATION FOR STUDY:  Hypersomnia with sleep apnea.  EPWORTH SLEEPINESS SCORE:  7/24.  BMI 23.1, weight 170 pounds, height 72 inches, neck 14 inches.  MEDICATIONS:  Home medications are charted and reviewed.  SLEEP ARCHITECTURE:  Total sleep time 239 minutes with sleep efficiency 62%.  Stage I was 18%, stage II 71.8%, stage III absent, REM 10.3% of total sleep time.  Sleep latency 32 minutes, REM latency 178.5 minutes, awake after sleep onset 115 minutes.  Arousal index 45.2.  Bedtime medication:  Amlodipine.  Sleep was marked by repeated spontaneous waking with lasting 30-45 minutes throughout the night.  RESPIRATORY DATA:  Apnea/hypopnea index (AHI) 8.5 per hour.  A total of 34 events were scored including 3 obstructive apneas, 1 central apnea and 30 hypopneas.  Events were associated with supine sleep position and REM.  REM AHI 7.3 per hour.  This is a diagnostic NPSG protocol as ordered, without CPAP.  OXYGEN DATA:  Moderate snoring with oxygen desaturation to a nadir of 84% and mean oxygen saturation through the study of 92.6% on room air.  CARDIAC DATA:  The rhythm appears to be atrial fibrillation with mean heart rate of 68 per minute.  MOVEMENT/PARASOMNIA:  No significant movement disturbance.  Bathroom x4.  IMPRESSION/RECOMMENDATION: 1. Sleep architecture on this night was marked by repeated spontaneous     waking, lasting 30-45 minutes, throughout the night.  He was up 4     times for bathroom.  These may reflect significant sleep     disturbances in  the home environment. 2. Mild obstructive sleep apnea/hypopnea syndrome, AHI 8.5 per hour     with events associated with supine sleep position and REM.  REM AHI     7.3 per hour.  Moderate snoring with oxygen desaturation to a nadir     of 84%     and mean oxygen saturation through the study of 92.6% on room air.     During the total recording time, oxygen saturation was less than     90% for 4.8 minutes and less than 88% for 0.7 minutes on room air.     Katha Kuehne D. Annamaria Boots, MD, [facility], [place], [place] Board of Sleep Medicine    CDY/MEDQ  D:  09/27/2012 10:05:16  T:  09/27/2012 11:32:49  Job:  [id number]

## 2012-10-23 DIAGNOSIS — Z09 Encounter for follow-up examination after completed treatment for conditions other than malignant neoplasm: Secondary | ICD-10-CM | POA: Diagnosis not present

## 2012-10-23 DIAGNOSIS — I4891 Unspecified atrial fibrillation: Secondary | ICD-10-CM | POA: Diagnosis not present

## 2012-11-10 DIAGNOSIS — I2510 Atherosclerotic heart disease of native coronary artery without angina pectoris: Secondary | ICD-10-CM | POA: Diagnosis not present

## 2012-11-10 DIAGNOSIS — E785 Hyperlipidemia, unspecified: Secondary | ICD-10-CM | POA: Diagnosis not present

## 2012-11-10 DIAGNOSIS — I495 Sick sinus syndrome: Secondary | ICD-10-CM | POA: Diagnosis not present

## 2012-11-10 DIAGNOSIS — Z7901 Long term (current) use of anticoagulants: Secondary | ICD-10-CM | POA: Diagnosis not present

## 2012-11-10 DIAGNOSIS — I4891 Unspecified atrial fibrillation: Secondary | ICD-10-CM | POA: Diagnosis not present

## 2012-11-10 DIAGNOSIS — R55 Syncope and collapse: Secondary | ICD-10-CM | POA: Diagnosis not present

## 2012-11-10 DIAGNOSIS — I119 Hypertensive heart disease without heart failure: Secondary | ICD-10-CM | POA: Diagnosis not present

## 2012-11-20 DIAGNOSIS — I4891 Unspecified atrial fibrillation: Secondary | ICD-10-CM | POA: Diagnosis not present

## 2012-12-15 DIAGNOSIS — I4891 Unspecified atrial fibrillation: Secondary | ICD-10-CM | POA: Diagnosis not present

## 2012-12-15 DIAGNOSIS — N400 Enlarged prostate without lower urinary tract symptoms: Secondary | ICD-10-CM | POA: Diagnosis not present

## 2012-12-22 DIAGNOSIS — R350 Frequency of micturition: Secondary | ICD-10-CM | POA: Diagnosis not present

## 2012-12-22 DIAGNOSIS — I4891 Unspecified atrial fibrillation: Secondary | ICD-10-CM | POA: Diagnosis not present

## 2012-12-22 DIAGNOSIS — I10 Essential (primary) hypertension: Secondary | ICD-10-CM | POA: Diagnosis not present

## 2012-12-25 DIAGNOSIS — E785 Hyperlipidemia, unspecified: Secondary | ICD-10-CM | POA: Diagnosis not present

## 2012-12-26 DIAGNOSIS — E559 Vitamin D deficiency, unspecified: Secondary | ICD-10-CM | POA: Diagnosis not present

## 2012-12-26 DIAGNOSIS — I10 Essential (primary) hypertension: Secondary | ICD-10-CM | POA: Diagnosis not present

## 2012-12-26 DIAGNOSIS — E785 Hyperlipidemia, unspecified: Secondary | ICD-10-CM | POA: Diagnosis not present

## 2012-12-26 DIAGNOSIS — E119 Type 2 diabetes mellitus without complications: Secondary | ICD-10-CM | POA: Diagnosis not present

## 2012-12-29 ENCOUNTER — Ambulatory Visit
Admission: RE | Admit: 2012-12-29 | Discharge: 2012-12-29 | Disposition: A | Discharge status: Home / Self Care | Payer: Medicare Other | Source: Ambulatory Visit | Attending: Gastroenterology | Admitting: Gastroenterology

## 2012-12-29 ENCOUNTER — Other Ambulatory Visit: Payer: Self-pay | Admitting: Gastroenterology

## 2012-12-29 DIAGNOSIS — R52 Pain, unspecified: Secondary | ICD-10-CM

## 2012-12-29 DIAGNOSIS — M169 Osteoarthritis of hip, unspecified: Secondary | ICD-10-CM | POA: Diagnosis not present

## 2012-12-29 DIAGNOSIS — R109 Unspecified abdominal pain: Secondary | ICD-10-CM | POA: Diagnosis not present

## 2013-01-06 DIAGNOSIS — M549 Dorsalgia, unspecified: Secondary | ICD-10-CM | POA: Diagnosis not present

## 2013-01-08 DIAGNOSIS — I4891 Unspecified atrial fibrillation: Secondary | ICD-10-CM | POA: Diagnosis not present

## 2013-01-14 ENCOUNTER — Other Ambulatory Visit: Payer: Self-pay | Admitting: Physical Medicine and Rehabilitation

## 2013-01-14 DIAGNOSIS — N949 Unspecified condition associated with female genital organs and menstrual cycle: Secondary | ICD-10-CM

## 2013-01-14 DIAGNOSIS — R102 Pelvic and perineal pain: Secondary | ICD-10-CM

## 2013-01-15 ENCOUNTER — Ambulatory Visit
Admission: RE | Admit: 2013-01-15 | Discharge: 2013-01-15 | Disposition: A | Discharge status: Home / Self Care | Payer: Medicare Other | Source: Ambulatory Visit | Attending: Physical Medicine and Rehabilitation | Admitting: Physical Medicine and Rehabilitation

## 2013-01-15 DIAGNOSIS — K623 Rectal prolapse: Secondary | ICD-10-CM | POA: Diagnosis not present

## 2013-01-15 DIAGNOSIS — R102 Pelvic and perineal pain: Secondary | ICD-10-CM

## 2013-01-20 DIAGNOSIS — F4323 Adjustment disorder with mixed anxiety and depressed mood: Secondary | ICD-10-CM | POA: Diagnosis not present

## 2013-01-21 DIAGNOSIS — M25559 Pain in unspecified hip: Secondary | ICD-10-CM | POA: Diagnosis not present

## 2013-01-29 ENCOUNTER — Ambulatory Visit: Payer: Medicare Other | Admitting: Physical Therapy

## 2013-02-03 ENCOUNTER — Ambulatory Visit: Payer: Medicare Other | Attending: Physical Medicine and Rehabilitation | Admitting: Physical Therapy

## 2013-02-03 DIAGNOSIS — R5381 Other malaise: Secondary | ICD-10-CM | POA: Insufficient documentation

## 2013-02-03 DIAGNOSIS — IMO0001 Reserved for inherently not codable concepts without codable children: Secondary | ICD-10-CM | POA: Diagnosis not present

## 2013-02-03 DIAGNOSIS — M2550 Pain in unspecified joint: Secondary | ICD-10-CM | POA: Insufficient documentation

## 2013-02-04 ENCOUNTER — Encounter: Payer: Medicare Other | Admitting: Physical Therapy

## 2013-02-05 ENCOUNTER — Ambulatory Visit: Payer: Medicare Other | Admitting: Physical Therapy

## 2013-02-09 ENCOUNTER — Encounter: Payer: Self-pay | Admitting: Cardiology

## 2013-02-09 DIAGNOSIS — I4891 Unspecified atrial fibrillation: Secondary | ICD-10-CM | POA: Diagnosis not present

## 2013-02-09 DIAGNOSIS — E785 Hyperlipidemia, unspecified: Secondary | ICD-10-CM | POA: Diagnosis not present

## 2013-02-09 DIAGNOSIS — Z7901 Long term (current) use of anticoagulants: Secondary | ICD-10-CM | POA: Diagnosis not present

## 2013-02-09 DIAGNOSIS — I119 Hypertensive heart disease without heart failure: Secondary | ICD-10-CM | POA: Diagnosis not present

## 2013-02-09 DIAGNOSIS — I495 Sick sinus syndrome: Secondary | ICD-10-CM | POA: Diagnosis not present

## 2013-02-09 DIAGNOSIS — I2510 Atherosclerotic heart disease of native coronary artery without angina pectoris: Secondary | ICD-10-CM | POA: Diagnosis not present

## 2013-02-09 NOTE — Progress Notes (Signed)
Patient ID: Dalton Vaughan, male   DOB: Oct 07, 1929, 79 y.o.   MRN: [id number]  [place], [place]    Date of visit:  02/09/2013 DOB:  29-Jun-1929    Age:  79 yrs. Medical record number:  [id number]     Account number:  [id number] Primary Care Provider: Redge Gainer ____________________________ CURRENT DIAGNOSES  1. CAD,Native  2. Long Term Use Anticoagulant  3. Arrhythmia-Atrial Fibrillation  4. Arrhythmia-Sick Sinus Syndrome  5. Surgery-Aortocoronary Bypass Grafting  6. Hypertensive Heart Disease-Benign without CHF  7. Hyperlipidemia  8. Personal History Of Transient Ischemic Attack (tia) And Cerebral Infarction Without Residual Deficits ____________________________ ALLERGIES  Penicillin Natural Combinations - Extended Release  Pravachol  Vytorin 10-20, Intolerance-myalgia  Zetia, Myaalgias ____________________________ MEDICATIONS  1. multivitamin tablet, 1 p.o. q.d.  2. nitroglycerin 0.4 mg tablet, sublingual, PRN  3. aspirin 81 mg tablet, effervescent, 1 p.o. daily  4. coQ10 (ubiquinol) 100 mg capsule, 1 p.o. daily  5. ramipril 10 mg capsule, 1 p.o. daily  6. Remeron 15 mg tablet, 1/2 tab daily  7. Cardura 8 mg Tablet, 3/4 tab qd  8. Warfarin 5 mg Tablet, Take as directed  9. triamterene-hydrochlorothiazid 37.5-25 mg Tablet, 3/4 tab qd  10. levothyroxine 25 mcg Tablet, 1 p.o. daily  11. Vitamin D-3 400 unit Capsule, 1 p.o. daily  12. Vitamin C 500 mg Tablet, 1/4 tab qd  13. Nexium 40 mg capsule,delayed release(DR/EC), 1/2 tab daily  14. folic acid 1 mg tablet, 1 p.o. daily  15. amlodipine 5 mg tablet, 1/2 tab b.i.d.  16. Stool Softener 100 mg tablet, prn  17. Crestor 10 mg tablet, 1/2 tab m-w-f ____________________________ CHIEF COMPLAINTS  Followup of Arrhythmia-Atrial Fibrillation ____________________________ HISTORY OF PRESENT ILLNESS  Patient seen for cardiac followup. He says he was previously here he tried to take Eliquus but he woke up one day feeling somewhat dizzy and had to  use his wife's walker. He stopped taking it thinking this caused him to be dizzy and went back to taking warfarin. Continues to complain of palpitations and intermittent dizziness. He denies angina and has no PND orthopnea, syncope, or claudication. He has had no recurrent TIAs. He describes a fair bit of low back pain and a has had a number of imaging studies and doctor visits trying to determine what it is due to. ____________________________ PAST HISTORY  Past Medical Illnesses:  hypertension, hyperlipidemia, CVA without deficits January 2003, history of depression 2004, GERD, BPH, TIA May 2009;  Cardiovascular Illnesses:  arrhythmia-PVCs, sick sinus syndrome, CAD, atrial fibrillation;  Surgical Procedures:  CABG w LIMA to LAD, SVG to int, SVG to OM2, SVG to AM-RCA 11/22/98 Dr. Servando Snare, laminectomy lumbar, tonsillectomy/adenoids, inguinal herniorrhaphy-rt;  Cardiology Procedures-Invasive:  cardiac cath (left) November 1999;  Cardiology Procedures-Noninvasive:  treadmill cardiolite April 2008, echocardiogram October 2007, treadmill cardiolite May 2011, event monitor August 2013;  Cardiac Cath Results:  normal Left main, 80% stenosis proximal LAD, 90% stenosis proximal CFX, 70% stenosis proximal RCA, 99% stenosis distal RCA;  LVEF of 69% documented via nuclear study on 04/21/2010 CHADS Score:  4  CHA2DS2-VASC Score:  7  ____________________________ CARDIO-PULMONARY TEST DATES EKG Date:  05/08/2011;   Cardiac Cath Date:  11/04/1998;  CABG: 11/22/1998;  Holter/Event Monitor Date: 08/01/2012;  Nuclear Study Date:  05/11/2010;  Echocardiography Date: 09/30/2006;  Chest Xray Date: 02/21/2012;   ____________________________ SOCIAL HISTORY Alcohol Use:  drinks occasionally and wine;  Smoking:  used to smoke but quit Prior to 1980;  Diet:  fat modified diet;  Lifestyle:  married;  Exercise:  exercises daily;  Occupation:  retired and Cytogeneticist at [employer];  Residence:  lives with wife, wife  has multiple myeloma;   ____________________________ REVIEW OF SYSTEMS General:  malaise and fatigueEyes:  cataracts, wears eye glasses/contact lenses  Respiratory:  denies dyspnea, cough, wheezing or hemoptysis.  Cardiovascular:  please review HPI  Abdominal:  dyspepsia  Genitourinary-Male:  nocturia  Musculoskeletal:  pain lower back  Neurological:  denies headaches, stroke, or TIA ____________________________ PHYSICAL EXAMINATION VITAL SIGNS  Blood Pressure:  120/78 Sitting, Right arm, regular cuff  , 124/80 Standing, Right arm and regular cuff   Pulse:  60/min. Weight:  175.00 lbs. Height:  70"BMI: 25  Constitutional:  pleasant white male in no acute distress Skin:  scattered seborrhic keratosis, tattoos Head:  normocephalic, balding male hair pattern ENT:  ears, nose and throat reveal no gross abnormalities.  Dentition good. Neck:  supple, no masses, thyromegaly, JVD. Carotid pulses are full and equal bilaterally without bruits. Chest:  clear to auscultation and percussion, healed median sternotomy scar Cardiac:  regular rhythm, normal S1 and S2, No S3 or S4, no murmurs, gallops or rubs detected. Peripheral Pulses:  the femoral,dorsalis pedis, and posterior tibial pulses are full and equal bilaterally with no bruits auscultated. Extremities & Back:  well healed saphenous vein donor site RLE, trace edema Neurological:  no gross motor or sensory deficits noted, affect appropriate, oriented x3. ____________________________ MOST RECENT LIPID PANEL 03/03/12  CHOL TOTL 176 mg/dl, LDL 110 calc, HDL 52 mg/dl and TRIGLYCER 71 mg/dl ____________________________ IMPRESSIONS/PLAN  1. Coronary artery disease with previous bypass grafting 2. Atrial fibrillation 3. Hypertension 4. Long-term anticoagulation with warfarin  Recommendations:  It is unclear whether the Eliquus did anything or not. He is happy taking warfarin and I stated that he should just remain on it. Followup in 6  months. ____________________________ TODAYS ORDERS  1. Return Visit: 6 months                       ____________________________ Cardiology Physician:  Kerry Hough MD [facility]

## 2013-02-10 ENCOUNTER — Ambulatory Visit: Payer: Medicare Other | Admitting: Physical Therapy

## 2013-02-10 DIAGNOSIS — I4891 Unspecified atrial fibrillation: Secondary | ICD-10-CM | POA: Diagnosis not present

## 2013-02-12 ENCOUNTER — Ambulatory Visit: Payer: Medicare Other | Admitting: Physical Therapy

## 2013-02-16 ENCOUNTER — Ambulatory Visit: Payer: Medicare Other | Attending: Physical Medicine and Rehabilitation | Admitting: *Deleted

## 2013-02-16 DIAGNOSIS — R5381 Other malaise: Secondary | ICD-10-CM | POA: Insufficient documentation

## 2013-02-16 DIAGNOSIS — IMO0001 Reserved for inherently not codable concepts without codable children: Secondary | ICD-10-CM | POA: Insufficient documentation

## 2013-02-16 DIAGNOSIS — M2550 Pain in unspecified joint: Secondary | ICD-10-CM | POA: Insufficient documentation

## 2013-02-18 ENCOUNTER — Encounter: Payer: Medicare Other | Admitting: Physical Therapy

## 2013-02-19 ENCOUNTER — Ambulatory Visit: Payer: Medicare Other | Admitting: Physical Therapy

## 2013-02-19 DIAGNOSIS — IMO0001 Reserved for inherently not codable concepts without codable children: Secondary | ICD-10-CM | POA: Diagnosis not present

## 2013-02-19 DIAGNOSIS — M2550 Pain in unspecified joint: Secondary | ICD-10-CM | POA: Diagnosis not present

## 2013-02-19 DIAGNOSIS — R5381 Other malaise: Secondary | ICD-10-CM | POA: Diagnosis not present

## 2013-02-20 ENCOUNTER — Encounter: Payer: Medicare Other | Admitting: Physical Therapy

## 2013-02-23 ENCOUNTER — Ambulatory Visit: Payer: Medicare Other | Admitting: Physical Therapy

## 2013-02-23 DIAGNOSIS — R5381 Other malaise: Secondary | ICD-10-CM | POA: Diagnosis not present

## 2013-02-23 DIAGNOSIS — M2550 Pain in unspecified joint: Secondary | ICD-10-CM | POA: Diagnosis not present

## 2013-02-23 DIAGNOSIS — IMO0001 Reserved for inherently not codable concepts without codable children: Secondary | ICD-10-CM | POA: Diagnosis not present

## 2013-02-25 ENCOUNTER — Ambulatory Visit: Payer: Medicare Other | Admitting: Physical Therapy

## 2013-02-25 DIAGNOSIS — M2550 Pain in unspecified joint: Secondary | ICD-10-CM | POA: Diagnosis not present

## 2013-02-25 DIAGNOSIS — R5381 Other malaise: Secondary | ICD-10-CM | POA: Diagnosis not present

## 2013-02-25 DIAGNOSIS — IMO0001 Reserved for inherently not codable concepts without codable children: Secondary | ICD-10-CM | POA: Diagnosis not present

## 2013-02-27 ENCOUNTER — Ambulatory Visit: Payer: Medicare Other | Admitting: Physical Therapy

## 2013-02-27 DIAGNOSIS — R5381 Other malaise: Secondary | ICD-10-CM | POA: Diagnosis not present

## 2013-02-27 DIAGNOSIS — IMO0001 Reserved for inherently not codable concepts without codable children: Secondary | ICD-10-CM | POA: Diagnosis not present

## 2013-02-27 DIAGNOSIS — M2550 Pain in unspecified joint: Secondary | ICD-10-CM | POA: Diagnosis not present

## 2013-03-02 ENCOUNTER — Ambulatory Visit: Payer: Medicare Other | Admitting: *Deleted

## 2013-03-02 DIAGNOSIS — M2550 Pain in unspecified joint: Secondary | ICD-10-CM | POA: Diagnosis not present

## 2013-03-02 DIAGNOSIS — R5381 Other malaise: Secondary | ICD-10-CM | POA: Diagnosis not present

## 2013-03-02 DIAGNOSIS — IMO0001 Reserved for inherently not codable concepts without codable children: Secondary | ICD-10-CM | POA: Diagnosis not present

## 2013-03-04 ENCOUNTER — Ambulatory Visit: Payer: Medicare Other | Admitting: *Deleted

## 2013-03-04 DIAGNOSIS — IMO0001 Reserved for inherently not codable concepts without codable children: Secondary | ICD-10-CM | POA: Diagnosis not present

## 2013-03-04 DIAGNOSIS — R5381 Other malaise: Secondary | ICD-10-CM | POA: Diagnosis not present

## 2013-03-04 DIAGNOSIS — M2550 Pain in unspecified joint: Secondary | ICD-10-CM | POA: Diagnosis not present

## 2013-03-05 DIAGNOSIS — M549 Dorsalgia, unspecified: Secondary | ICD-10-CM | POA: Diagnosis not present

## 2013-03-10 DIAGNOSIS — H04129 Dry eye syndrome of unspecified lacrimal gland: Secondary | ICD-10-CM | POA: Diagnosis not present

## 2013-03-10 DIAGNOSIS — H43819 Vitreous degeneration, unspecified eye: Secondary | ICD-10-CM | POA: Diagnosis not present

## 2013-03-10 DIAGNOSIS — H0230 Blepharochalasis unspecified eye, unspecified eyelid: Secondary | ICD-10-CM | POA: Diagnosis not present

## 2013-03-10 DIAGNOSIS — Z961 Presence of intraocular lens: Secondary | ICD-10-CM | POA: Diagnosis not present

## 2013-03-11 ENCOUNTER — Ambulatory Visit: Payer: Medicare Other | Admitting: Physical Therapy

## 2013-03-11 DIAGNOSIS — R5381 Other malaise: Secondary | ICD-10-CM | POA: Diagnosis not present

## 2013-03-11 DIAGNOSIS — IMO0001 Reserved for inherently not codable concepts without codable children: Secondary | ICD-10-CM | POA: Diagnosis not present

## 2013-03-11 DIAGNOSIS — M2550 Pain in unspecified joint: Secondary | ICD-10-CM | POA: Diagnosis not present

## 2013-03-13 ENCOUNTER — Ambulatory Visit: Payer: Medicare Other | Admitting: Physical Therapy

## 2013-03-13 DIAGNOSIS — IMO0001 Reserved for inherently not codable concepts without codable children: Secondary | ICD-10-CM | POA: Diagnosis not present

## 2013-03-13 DIAGNOSIS — M2550 Pain in unspecified joint: Secondary | ICD-10-CM | POA: Diagnosis not present

## 2013-03-13 DIAGNOSIS — R5381 Other malaise: Secondary | ICD-10-CM | POA: Diagnosis not present

## 2013-03-17 ENCOUNTER — Ambulatory Visit: Payer: Medicare Other | Attending: Physical Medicine and Rehabilitation | Admitting: Physical Therapy

## 2013-03-17 DIAGNOSIS — IMO0001 Reserved for inherently not codable concepts without codable children: Secondary | ICD-10-CM | POA: Diagnosis not present

## 2013-03-17 DIAGNOSIS — R5381 Other malaise: Secondary | ICD-10-CM | POA: Diagnosis not present

## 2013-03-17 DIAGNOSIS — M2550 Pain in unspecified joint: Secondary | ICD-10-CM | POA: Insufficient documentation

## 2013-03-20 ENCOUNTER — Ambulatory Visit: Payer: Medicare Other | Admitting: Physical Therapy

## 2013-03-23 DIAGNOSIS — M549 Dorsalgia, unspecified: Secondary | ICD-10-CM | POA: Diagnosis not present

## 2013-03-30 ENCOUNTER — Ambulatory Visit (INDEPENDENT_AMBULATORY_CARE_PROVIDER_SITE_OTHER): Payer: Medicare Other | Admitting: Pharmacist

## 2013-03-30 ENCOUNTER — Telehealth: Payer: Self-pay | Admitting: Family Medicine

## 2013-03-30 DIAGNOSIS — I4891 Unspecified atrial fibrillation: Secondary | ICD-10-CM

## 2013-03-30 LAB — POCT INR: INR: 1.5

## 2013-03-30 MED ORDER — MIRTAZAPINE 15 MG PO TABS: 7.5000 mg | ORAL_TABLET | Freq: Every day | ORAL | Status: DC

## 2013-04-08 ENCOUNTER — Other Ambulatory Visit: Payer: Self-pay

## 2013-04-08 MED ORDER — MIRTAZAPINE 15 MG PO TABS: 7.5000 mg | ORAL_TABLET | Freq: Every day | ORAL | Status: DC

## 2013-04-08 NOTE — Telephone Encounter (Signed)
Last seen 12/22/12   Print for mail order and have nurse call to have patient pick up

## 2013-04-09 ENCOUNTER — Telehealth: Payer: Self-pay | Admitting: *Deleted

## 2013-04-09 NOTE — Telephone Encounter (Signed)
Pt aware Remeron script ready for him to pick up .

## 2013-04-22 ENCOUNTER — Encounter: Payer: Self-pay | Admitting: Family Medicine

## 2013-04-22 ENCOUNTER — Ambulatory Visit (INDEPENDENT_AMBULATORY_CARE_PROVIDER_SITE_OTHER): Payer: Medicare Other | Admitting: Family Medicine

## 2013-04-22 VITALS — BP 151/84 | HR 57 | Temp 96.9°F | Ht 71.0 in | Wt 180.0 lb

## 2013-04-22 DIAGNOSIS — R5381 Other malaise: Secondary | ICD-10-CM

## 2013-04-22 DIAGNOSIS — W57XXXA Bitten or stung by nonvenomous insect and other nonvenomous arthropods, initial encounter: Secondary | ICD-10-CM | POA: Diagnosis not present

## 2013-04-22 DIAGNOSIS — I10 Essential (primary) hypertension: Secondary | ICD-10-CM

## 2013-04-22 DIAGNOSIS — T148 Other injury of unspecified body region: Secondary | ICD-10-CM | POA: Diagnosis not present

## 2013-04-22 DIAGNOSIS — E039 Hypothyroidism, unspecified: Secondary | ICD-10-CM

## 2013-04-22 DIAGNOSIS — K6289 Other specified diseases of anus and rectum: Secondary | ICD-10-CM

## 2013-04-22 DIAGNOSIS — E559 Vitamin D deficiency, unspecified: Secondary | ICD-10-CM

## 2013-04-22 DIAGNOSIS — R5383 Other fatigue: Secondary | ICD-10-CM

## 2013-04-22 DIAGNOSIS — I4891 Unspecified atrial fibrillation: Secondary | ICD-10-CM

## 2013-04-22 DIAGNOSIS — E785 Hyperlipidemia, unspecified: Secondary | ICD-10-CM | POA: Diagnosis not present

## 2013-04-22 DIAGNOSIS — R972 Elevated prostate specific antigen [PSA]: Secondary | ICD-10-CM

## 2013-04-22 LAB — POCT CBC
Granulocyte percent: 61.8 %G (ref 37–80)
HCT, POC: 40.6 % — AB (ref 43.5–53.7)
Hemoglobin: 13.7 g/dL — AB (ref 14.1–18.1)
Lymph, poc: 2 (ref 0.6–3.4)
MCH, POC: 29 pg (ref 27–31.2)
MCHC: 33.6 g/dL (ref 31.8–35.4)
MCV: 86.2 fL (ref 80–97)
MPV: 8.9 fL (ref 0–99.8)
POC Granulocyte: 3.8 (ref 2–6.9)
POC LYMPH PERCENT: 33.4 %L (ref 10–50)
Platelet Count, POC: 133 10*3/uL — AB (ref 142–424)
RBC: 4.7 M/uL (ref 4.69–6.13)
RDW, POC: 17.3 %
WBC: 6.1 10*3/uL (ref 4.6–10.2)

## 2013-04-22 LAB — BASIC METABOLIC PANEL WITH GFR
BUN: 10 mg/dL (ref 6–23)
CO2: 28 mEq/L (ref 19–32)
Calcium: 9.3 mg/dL (ref 8.4–10.5)
Chloride: 105 mEq/L (ref 96–112)
Creat: 0.74 mg/dL (ref 0.50–1.35)
GFR, Est African American: >89 mL/min
GFR, Est Non African American: 87 mL/min
Glucose, Bld: 87 mg/dL (ref 70–99)
Potassium: 4.1 mEq/L (ref 3.5–5.3)
Sodium: 142 mEq/L (ref 135–145)

## 2013-04-22 LAB — HEPATIC FUNCTION PANEL
ALT: 13 U/L (ref 0–53)
AST: 20 U/L (ref 0–37)
Albumin: 4.4 g/dL (ref 3.5–5.2)
Alkaline Phosphatase: 81 U/L (ref 39–117)
Bilirubin, Direct: 0.2 mg/dL (ref 0.0–0.3)
Indirect Bilirubin: 0.5 mg/dL (ref 0.0–0.9)
Total Bilirubin: 0.7 mg/dL (ref 0.3–1.2)
Total Protein: 6.6 g/dL (ref 6.0–8.3)

## 2013-04-22 LAB — LIPID PANEL
Cholesterol: 167 mg/dL (ref 0–200)
HDL: 49 mg/dL (ref >39)
LDL Cholesterol: 102 mg/dL — ABNORMAL HIGH (ref 0–99)
Total CHOL/HDL Ratio: 3.4 Ratio
Triglycerides: 81 mg/dL (ref <150)
VLDL: 16 mg/dL (ref 0–40)

## 2013-04-22 LAB — PSA: PSA: 10.09 ng/mL — ABNORMAL HIGH (ref <=4.00)

## 2013-04-22 LAB — THYROID PANEL WITH TSH
Free Thyroxine Index: 2.8 (ref 1.0–3.9)
T3 Uptake: 35.1 % (ref 22.5–37.0)
T4, Total: 8.1 ug/dL (ref 5.0–12.5)
TSH: 2.46 u[IU]/mL (ref 0.350–4.500)

## 2013-04-22 MED ORDER — ROSUVASTATIN CALCIUM 5 MG PO TABS: 5.0000 mg | ORAL_TABLET | ORAL | Status: DC

## 2013-04-22 MED ORDER — RAMIPRIL 10 MG PO TABS: 10.0000 mg | ORAL_TABLET | Freq: Every day | ORAL | Status: DC

## 2013-04-22 MED ORDER — DOXYCYCLINE HYCLATE 100 MG PO TABS: 100.0000 mg | ORAL_TABLET | Freq: Two times a day (BID) | ORAL | Status: DC

## 2013-04-22 MED ORDER — TRIAMTERENE-HCTZ 37.5-25 MG PO TABS: 1.0000 | ORAL_TABLET | Freq: Every day | ORAL | Status: DC

## 2013-04-22 MED ORDER — AMLODIPINE BESYLATE 5 MG PO TABS: 2.5000 mg | ORAL_TABLET | Freq: Two times a day (BID) | ORAL | Status: DC

## 2013-04-22 MED ORDER — WARFARIN SODIUM 5 MG PO TABS: 2.5000 mg | ORAL_TABLET | Freq: Every day | ORAL | Status: DC

## 2013-04-22 MED ORDER — DOXAZOSIN MESYLATE 8 MG PO TABS: 6.0000 mg | ORAL_TABLET | Freq: Every day | ORAL | Status: DC

## 2013-04-22 MED ORDER — LEVOTHYROXINE SODIUM 25 MCG PO TABS: 25.0000 ug | ORAL_TABLET | Freq: Every day | ORAL | Status: DC

## 2013-04-22 NOTE — Addendum Note (Signed)
Addended by: Marin Olp on: 04/22/2013 12:00 PM   Modules accepted: Orders

## 2013-04-22 NOTE — Patient Instructions (Addendum)
Continue current meds and therapy lifestyle changes.  Tub soaks when possible. Continue Flonase for allergic rhinitis.    Deer Tick Bite Deer ticks are brown arachnids (spider family) that vary in size from as small as the head of a pin to 1/4 inch (1/2 cm) diameter. They thrive in wooded areas. Deer are the preferred host of adult deer ticks. Small rodents are the host of young ticks (nymphs). When a person walks in a field or wooded area, young and adult ticks in the surrounding grass and vegetation can attach themselves to the skin. They can suck blood for hours to days if unnoticed. Ticks are found all over the [place] Some ticks carry a specific bacteria (Borrelia burgdorferi) that causes an infection called Lyme disease. The bacteria is typically passed into a person during the blood sucking process. This happens after the tick has been attached for at least a number of hours. While ticks can be found all over the [place] those carrying the bacteria that causes Lyme disease are most common in [place]. Only a small proportion of ticks in these areas carry the Lyme disease bacteria and cause human infections. Ticks usually attach to warm spots on the body, such as the:  Head.  Back.  Neck.  Armpits.  Groin. SYMPTOMS  Most of the time, a deer tick bite will not be felt. You may or may not see the attached tick. You may notice mild irritation or redness around the bite site. If the deer tick passes the Lyme disease bacteria to a person, a round, red rash may be noticed 2 to 3 days after the bite. The rash may be clear in the middle, like a bull's-eye or target. If not treated, other symptoms may develop several days to weeks after the onset of the rash. These symptoms may include:  New rash lesions.  Fatigue and weakness.  General ill feeling and achiness.  Chills.  Headache and neck pain.  Swollen lymph glands.  Sore muscles and joints. 5 to 15% of untreated  people with Lyme disease may develop more severe illnesses after several weeks to months. This may include inflammation of the brain lining (meningitis), nerve palsies, an abnormal heartbeat, or severe muscle and joint pain and inflammation (myositis or arthritis). DIAGNOSIS   Physical exam and medical history.  Viewing the tick if it was saved for confirmation.  Blood tests (to check or confirm the presence of Lyme disease). TREATMENT  Most ticks do not carry disease. If found, an attached tick should be removed using tweezers. Tweezers should be placed under the body of the tick so it is removed by its attachment parts (pincers). If there are signs or symptoms of being sick, or Lyme disease is confirmed, medicines (antibiotics) that kill germs are usually prescribed. In more severe cases, antibiotics may be given through an intravenous (IV) access. HOME CARE INSTRUCTIONS   Always remove ticks with tweezers. Do not use petroleum jelly or other methods to kill or remove the tick. Slide the tweezers under the body and pull out as much as you can. If you are not sure what it is, save it in a jar and show your caregiver.  Once you remove the tick, the skin will heal on its own. Wash your hands and the affected area with water and soap. You may place a bandage on the affected area.  Take medicine as directed. You may be advised to take a full course of antibiotics.  Follow up with your caregiver as recommended. FINDING OUT THE RESULTS OF YOUR TEST Not all test results are available during your visit. If your test results are not back during the visit, make an appointment with your caregiver to find out the results. Do not assume everything is normal if you have not heard from your caregiver or the medical facility. It is important for you to follow up on all of your test results. PROGNOSIS  If Lyme disease is confirmed, early treatment with antibiotics is very effective. Following preventive  guidelines is important since it is possible to get the disease more than once. PREVENTION   Wear long sleeves and long pants in wooded or grassy areas. Tuck your pants into your socks.  Use an insect repellent while hiking.  Check yourself, your children, and your pets regularly for ticks after playing outside.  Clear piles of leaves or brush from your yard. Ticks might live there. SEEK MEDICAL CARE IF:   You or your child has an oral temperature above 102 F (38.9 C).  You develop a severe headache following the bite.  You feel generally ill.  You notice a rash.  You are having trouble removing the tick.  The bite area has red skin or yellow drainage. SEEK IMMEDIATE MEDICAL CARE IF:   Your face is weak and droopy or you have other neurological symptoms.  You have severe joint pain or weakness. MAKE SURE YOU:   Understand these instructions.  Will watch your condition.  Will get help right away if you are not doing well or get worse. FOR MORE INFORMATION Centers for Disease Control and Prevention: http://www.wolf.info/ American Academy of Family Physicians: www.AromatherapyParty.no Document Released: 02/27/2010 Document Revised: 02/25/2012 Document Reviewed: 02/27/2010 [facility] Patient Information 2013 [place].

## 2013-04-22 NOTE — Addendum Note (Signed)
Addended by: Marin Olp on: 04/22/2013 11:54 AM   Modules accepted: Orders

## 2013-04-22 NOTE — Addendum Note (Signed)
Addended by: Lyn Henri C on: 04/22/2013 11:55 AM   Modules accepted: Orders

## 2013-04-22 NOTE — Progress Notes (Addendum)
Subjective:    Patient ID: Dalton Vaughan, male    DOB: 12-Feb-1929, 79 y.o.   MRN: [id number]  HPI This patient presents for recheck of multiple medical problems. No one accompanies the patient today.  Patient Active Problem List   Diagnosis Date Noted  . Sick sinus syndrome 07/31/2012  . hypertension   . Atrial fibrillation   . CAD (coronary artery disease)   . History of TIAs   . Lumbar disc disease   . GERD (gastroesophageal reflux disease)   . Long-term (current) use of anticoagulants   . BPH (benign prostatic hypertrophy)   . Hyperlipidemia     In addition, See Ros. Patient continues to have left rectal pain and buttock pain toward the bottom of the buttock. He has seen Dr. Amedeo Plenty the gastroenterologist. He is seeing Dr. Mliss Fritz, the orthopedist. He has had a CT of the pelvis and plain films of the pelvis. The patient sees Dr. Wynonia Lawman the cardiologist, about every 6 months. He last saw him in March of 2014. Also patient notes that a tick had been  Stuck in the skin in his left shoulder for an extended period of time and was removed several months ago.  The allergies, current medications, past medical history, surgical history, family and social history are reviewed.  Immunizations reviewed.  Health maintenance reviewed.  The following items are outstanding: None.      Review of Systems  Constitutional: Positive for fever (slight) and fatigue.  HENT: Negative.   Eyes: Positive for itching (due to allergies).  Respiratory: Negative.   Cardiovascular: Positive for palpitations (due to afib) and leg swelling. Negative for chest pain.  Gastrointestinal: Negative.   Endocrine: Negative.   Genitourinary: Negative.   Musculoskeletal: Positive for joint swelling (bilateral knees, R ankle) and arthralgias (knees,groin).  Allergic/Immunologic: Positive for environmental allergies (seasonal).  Neurological: Positive for weakness (legs).  Psychiatric/Behavioral: The patient is  nervous/anxious (increased).    Redness at site of tick bite left posterior shoulder.    Objective:   Physical Exam BP 151/84  Pulse 57  Temp(Src) 96.9 F (36.1 C) (Oral)  Ht 5' 11"  (1.803 m)  Wt 180 lb (81.647 kg)  BMI 25.12 kg/m2  The patient appeared well nourished and normally developed, alert and oriented to time and place. Speech, behavior and judgement appear normal. Vital signs as documented.  Head exam is unremarkable. No scleral icterus or pallor noted. There is some nasal congestion bilaterally.  Neck is without jugular venous distension, thyromegally, or carotid bruits. Carotid upstrokes are brisk bilaterally. No cervical adenopathy. Lungs are clear anteriorly and posteriorly to auscultation. Normal respiratory effort. No axillary adenopathy. Cardiac exam reveals irregular irregular  rate and rhythm at 60 per minute. First and second heart sounds normal.  No murmurs, rubs or gallops.  Abdominal exam reveals normal bowl sounds, no masses, no organomegaly and no aortic enlargement. No inguinal adenopathy.  Rectal exam reveals some perirectal cystic areas. Prostate was slightly enlarged. No other abnormalities were felt with the rectal exam. Testicles were checked and there was no hernia. Prostate exam was slightly enlarged without lumps or nodules. Extremities are nonedematous and both femoral and pedal pulses are normal. Varicose veins lower extremities Skin without pallor or jaundice.  Warm and dry, questionable papular rash on back. Area of redness left posterior shoulder where tick had been removed in the past. Neurologic exam reveals normal deep tendon reflexes and normal sensation.          Assessment & Plan:  1. Hyperlipemia  - NMR Lipoprofile with Lipids; Standing - Hepatic function panel; Standing - Lipid panel  2. hypertension - BASIC METABOLIC PANEL WITH GFR; Standing  3. Tick bite - B. burgdorfi antibodies - [place] mtn spotted fvr abs  pnl(IgG+IgM)  4. Fatigue - POCT CBC; Standing - Thyroid Panel With TSH - B. burgdorfi antibodies - [place] mtn spotted fvr abs pnl(IgG+IgM)  5. PSA elevation - PSA  6. Vitamin D deficiency - Vitamin D 25 hydroxy; Standing  7. Rectal pain   Patient Instructions  Continue current meds and therapy lifestyle changes.  Tub soaks when possible. Continue Flonase for allergic rhinitis.    Deer Tick Bite Deer ticks are brown arachnids (spider family) that vary in size from as small as the head of a pin to 1/4 inch (1/2 cm) diameter. They thrive in wooded areas. Deer are the preferred host of adult deer ticks. Small rodents are the host of young ticks (nymphs). When a person walks in a field or wooded area, young and adult ticks in the surrounding grass and vegetation can attach themselves to the skin. They can suck blood for hours to days if unnoticed. Ticks are found all over the [place] Some ticks carry a specific bacteria (Borrelia burgdorferi) that causes an infection called Lyme disease. The bacteria is typically passed into a person during the blood sucking process. This happens after the tick has been attached for at least a number of hours. While ticks can be found all over the [place] those carrying the bacteria that causes Lyme disease are most common in [place]. Only a small proportion of ticks in these areas carry the Lyme disease bacteria and cause human infections. Ticks usually attach to warm spots on the body, such as the:  Head.  Back.  Neck.  Armpits.  Groin. SYMPTOMS  Most of the time, a deer tick bite will not be felt. You may or may not see the attached tick. You may notice mild irritation or redness around the bite site. If the deer tick passes the Lyme disease bacteria to a person, a round, red rash may be noticed 2 to 3 days after the bite. The rash may be clear in the middle, like a bull's-eye or target. If not treated, other symptoms may develop  several days to weeks after the onset of the rash. These symptoms may include:  New rash lesions.  Fatigue and weakness.  General ill feeling and achiness.  Chills.  Headache and neck pain.  Swollen lymph glands.  Sore muscles and joints. 5 to 15% of untreated people with Lyme disease may develop more severe illnesses after several weeks to months. This may include inflammation of the brain lining (meningitis), nerve palsies, an abnormal heartbeat, or severe muscle and joint pain and inflammation (myositis or arthritis). DIAGNOSIS   Physical exam and medical history.  Viewing the tick if it was saved for confirmation.  Blood tests (to check or confirm the presence of Lyme disease). TREATMENT  Most ticks do not carry disease. If found, an attached tick should be removed using tweezers. Tweezers should be placed under the body of the tick so it is removed by its attachment parts (pincers). If there are signs or symptoms of being sick, or Lyme disease is confirmed, medicines (antibiotics) that kill germs are usually prescribed. In more severe cases, antibiotics may be given through an intravenous (IV) access. HOME CARE INSTRUCTIONS   Always remove ticks with tweezers. Do not use  petroleum jelly or other methods to kill or remove the tick. Slide the tweezers under the body and pull out as much as you can. If you are not sure what it is, save it in a jar and show your caregiver.  Once you remove the tick, the skin will heal on its own. Wash your hands and the affected area with water and soap. You may place a bandage on the affected area.  Take medicine as directed. You may be advised to take a full course of antibiotics.  Follow up with your caregiver as recommended. FINDING OUT THE RESULTS OF YOUR TEST Not all test results are available during your visit. If your test results are not back during the visit, make an appointment with your caregiver to find out the results. Do not assume  everything is normal if you have not heard from your caregiver or the medical facility. It is important for you to follow up on all of your test results. PROGNOSIS  If Lyme disease is confirmed, early treatment with antibiotics is very effective. Following preventive guidelines is important since it is possible to get the disease more than once. PREVENTION   Wear long sleeves and long pants in wooded or grassy areas. Tuck your pants into your socks.  Use an insect repellent while hiking.  Check yourself, your children, and your pets regularly for ticks after playing outside.  Clear piles of leaves or brush from your yard. Ticks might live there. SEEK MEDICAL CARE IF:   You or your child has an oral temperature above 102 F (38.9 C).  You develop a severe headache following the bite.  You feel generally ill.  You notice a rash.  You are having trouble removing the tick.  The bite area has red skin or yellow drainage. SEEK IMMEDIATE MEDICAL CARE IF:   Your face is weak and droopy or you have other neurological symptoms.  You have severe joint pain or weakness. MAKE SURE YOU:   Understand these instructions.  Will watch your condition.  Will get help right away if you are not doing well or get worse. FOR MORE INFORMATION Centers for Disease Control and Prevention: http://www.wolf.info/ American Academy of Family Physicians: www.AromatherapyParty.no Document Released: 02/27/2010 Document Revised: 02/25/2012 Document Reviewed: 02/27/2010 [facility] Patient Information 2013 [place].

## 2013-04-23 DIAGNOSIS — M25559 Pain in unspecified hip: Secondary | ICD-10-CM | POA: Diagnosis not present

## 2013-04-23 LAB — VITAMIN D 25 HYDROXY (VIT D DEFICIENCY, FRACTURES): Vit D, 25-Hydroxy: 50 ng/mL (ref 30–89)

## 2013-04-23 LAB — B. BURGDORFI ANTIBODIES: B burgdorferi Ab IgG+IgM: 1.23 {ISR} — ABNORMAL HIGH

## 2013-04-23 LAB — ROCKY MTN SPOTTED FVR ABS PNL(IGG+IGM)
RMSF IgG: 0.83 IV — ABNORMAL HIGH
RMSF IgM: 0.54 IV

## 2013-04-24 LAB — B. BURGDORFI ANTIBODIES BY WB
B burgdorferi IgG Abs (IB): NEGATIVE
B burgdorferi IgM Abs (IB): NEGATIVE

## 2013-05-09 ENCOUNTER — Other Ambulatory Visit: Payer: Self-pay | Admitting: Family Medicine

## 2013-05-12 ENCOUNTER — Ambulatory Visit (INDEPENDENT_AMBULATORY_CARE_PROVIDER_SITE_OTHER): Payer: Medicare Other | Admitting: Pharmacist Clinician (PhC)/ Clinical Pharmacy Specialist

## 2013-05-12 DIAGNOSIS — I4891 Unspecified atrial fibrillation: Secondary | ICD-10-CM

## 2013-05-12 LAB — POCT INR: INR: 1.7

## 2013-05-12 NOTE — Telephone Encounter (Signed)
PLEASE PRINT. MAIL ORDER. CALL PT WHEN READY. DID NOT SEE ON MED LIST.

## 2013-05-26 NOTE — Telephone Encounter (Signed)
Error

## 2013-06-11 ENCOUNTER — Ambulatory Visit (INDEPENDENT_AMBULATORY_CARE_PROVIDER_SITE_OTHER): Payer: Medicare Other | Admitting: Pharmacist

## 2013-06-11 ENCOUNTER — Telehealth: Payer: Self-pay | Admitting: Pharmacist

## 2013-06-11 DIAGNOSIS — I10 Essential (primary) hypertension: Secondary | ICD-10-CM

## 2013-06-11 DIAGNOSIS — I4891 Unspecified atrial fibrillation: Secondary | ICD-10-CM

## 2013-06-11 LAB — POCT INR: INR: 1.5

## 2013-06-11 MED ORDER — RAMIPRIL 10 MG PO TABS: 10.0000 mg | ORAL_TABLET | Freq: Every day | ORAL | Status: DC

## 2013-06-11 NOTE — Patient Instructions (Signed)
Anticoagulation Dose Instructions as of 06/11/2013     [name] Tue Wed Thu Fri Sat   New Dose 2.5 mg 5 mg 2.5 mg 5 mg 2.5 mg 5 mg 2.5 mg    Description       Continue same dose.  Goal INR is 1.5 to 2.0.         INR was 1.5 today

## 2013-06-12 DIAGNOSIS — N402 Nodular prostate without lower urinary tract symptoms: Secondary | ICD-10-CM | POA: Diagnosis not present

## 2013-06-12 DIAGNOSIS — N401 Enlarged prostate with lower urinary tract symptoms: Secondary | ICD-10-CM | POA: Diagnosis not present

## 2013-06-12 DIAGNOSIS — R972 Elevated prostate specific antigen [PSA]: Secondary | ICD-10-CM | POA: Diagnosis not present

## 2013-06-16 NOTE — Telephone Encounter (Signed)
Please call these in for patient as per Tammy's information with refills

## 2013-06-24 NOTE — Telephone Encounter (Signed)
Medications are not listed on med list.  Called patient to determine what exactly he needed.  I left a message on his home voicemail to return my call.

## 2013-06-25 ENCOUNTER — Other Ambulatory Visit: Payer: Self-pay | Admitting: Pharmacist

## 2013-06-25 MED ORDER — AZELASTINE HCL 0.1 % NA SOLN: NASAL | Status: DC

## 2013-06-25 MED ORDER — FLUTICASONE PROPIONATE 50 MCG/ACT NA SUSP: NASAL | Status: DC

## 2013-06-25 NOTE — Telephone Encounter (Signed)
rx's called to [facility] Pt notified

## 2013-08-03 ENCOUNTER — Ambulatory Visit (INDEPENDENT_AMBULATORY_CARE_PROVIDER_SITE_OTHER): Payer: Medicare Other | Admitting: Family Medicine

## 2013-08-03 ENCOUNTER — Encounter: Payer: Self-pay | Admitting: Family Medicine

## 2013-08-03 ENCOUNTER — Ambulatory Visit (INDEPENDENT_AMBULATORY_CARE_PROVIDER_SITE_OTHER): Payer: Self-pay | Admitting: Pharmacist

## 2013-08-03 VITALS — BP 145/77 | HR 75 | Temp 97.8°F | Ht 71.0 in | Wt 178.8 lb

## 2013-08-03 DIAGNOSIS — E559 Vitamin D deficiency, unspecified: Secondary | ICD-10-CM

## 2013-08-03 DIAGNOSIS — I10 Essential (primary) hypertension: Secondary | ICD-10-CM | POA: Diagnosis not present

## 2013-08-03 DIAGNOSIS — F329 Major depressive disorder, single episode, unspecified: Secondary | ICD-10-CM

## 2013-08-03 DIAGNOSIS — E785 Hyperlipidemia, unspecified: Secondary | ICD-10-CM

## 2013-08-03 DIAGNOSIS — M25559 Pain in unspecified hip: Secondary | ICD-10-CM

## 2013-08-03 DIAGNOSIS — Z79899 Other long term (current) drug therapy: Secondary | ICD-10-CM

## 2013-08-03 DIAGNOSIS — I4891 Unspecified atrial fibrillation: Secondary | ICD-10-CM | POA: Diagnosis not present

## 2013-08-03 DIAGNOSIS — F32A Depression, unspecified: Secondary | ICD-10-CM

## 2013-08-03 DIAGNOSIS — M25551 Pain in right hip: Secondary | ICD-10-CM

## 2013-08-03 LAB — POCT INR: INR: 1.6

## 2013-08-03 MED ORDER — TRIAMTERENE-HCTZ 37.5-25 MG PO TABS: 1.0000 | ORAL_TABLET | Freq: Every day | ORAL | Status: DC

## 2013-08-03 MED ORDER — NITROGLYCERIN 0.4 MG SL SUBL: 0.4000 mg | SUBLINGUAL_TABLET | SUBLINGUAL | Status: DC | PRN

## 2013-08-03 MED ORDER — MIRTAZAPINE 15 MG PO TABS: 7.5000 mg | ORAL_TABLET | Freq: Every day | ORAL | Status: DC

## 2013-08-03 NOTE — Progress Notes (Signed)
Subjective:    Patient ID: Dalton Vaughan, male    DOB: [birth year], 79 y.o.   MRN: [id number]  HPI Patient comes in today for followup and management of chronic medical problems. These include hypertension, hyperlipidemia, atrial fibrillation with high risk medication use, vitamin D deficiency, and depression. The patient sees a cardiologist about every 6 months. He also sees the urologist yearly. This is because of an elevated PSA. Patient continues to complain of leg pain every night when in bed or with sitting. He also continues to complain of pain in the left hip joint. He says the fatigue that he experiences continues to progress and get worse. He is currently seeing someone that works with Dr. Laren Everts and he is seen of one of the doctors in [place] orthopedics in the past also.   Review of Systems  Constitutional: Positive for fatigue. Negative for activity change and appetite change.  HENT: Positive for rhinorrhea and postnasal drip. Negative for ear pain, congestion, sore throat and sinus pressure.   Eyes: Positive for discharge (L in the AM) and redness (L in the AM).  Respiratory: Positive for cough (slight in the AM, slighty prod.). Negative for chest tightness, shortness of breath and wheezing.   Cardiovascular: Positive for palpitations (due to Afib) and leg swelling (bilateral in the evening, R>L). Negative for chest pain.  Gastrointestinal: Negative for vomiting, abdominal pain, diarrhea, constipation and blood in stool.  Endocrine: Negative.  Negative for cold intolerance, heat intolerance, polydipsia, polyphagia and polyuria.  Genitourinary: Positive for frequency (BPH) and testicular pain (L side). Negative for dysuria and urgency.  Musculoskeletal: Positive for back pain (LBP) and arthralgias (L hip, bilateral legs and hips, R shoulder). Negative for myalgias.  Skin: Positive for rash (very sensitive to insect bites, dry skin). Negative for color change and wound.   Allergic/Immunologic: Positive for environmental allergies (year around).  Neurological: Positive for dizziness (occasional). Negative for tremors, weakness, light-headedness, numbness and headaches.  Hematological: Bruises/bleeds easily (due to med).  Psychiatric/Behavioral: Positive for decreased concentration (slight memory deficit). Negative for confusion, sleep disturbance and agitation. The patient is not nervous/anxious.        Objective:   Physical Exam BP 145/77  Pulse 75  Temp(Src) 97.8 F (36.6 C) (Oral)  Ht 5' 11"  (1.803 m)  Wt 178 lb 12.8 oz (81.103 kg)  BMI 24.95 kg/m2  The patient appeared well nourished and normally developed for his age, alert and oriented to time and place. Speech, behavior and judgement appear normal. Vital signs as documented.  Head exam is unremarkable. No scleral icterus or pallor noted. Ears nose and throat were within normal limits Neck is without jugular venous distension, thyromegally, or carotid bruits. Carotid upstrokes are brisk bilaterally. No cervical adenopathy. Lungs are clear anteriorly and posteriorly to auscultation. Normal respiratory effort. Cardiac exam reveals irregular irregular rate and rhythm at 72 per min. First and second heart sounds normal. No murmurs, rubs or gallops.  Abdominal exam reveals normal bowl sounds, no masses, no organomegaly and no aortic enlargement. No inguinal adenopathy. Extremities are nonedematous and both femoral pulses are normal. Skin without pallor or jaundice.  Warm and dry, without rash. Neurologic exam reveals normal deep tendon reflexes and normal sensation.          Assessment & Plan:  1. Hypertension  2. Hyperlipemia  3. Atrial fibrillation  4. Vitamin D deficiency  5. High risk medication use -Protime today was 1.6  6. Depression -Follow up with Dr. Laren Everts with appropriate medication  without side effect  7. hypertension - triamterene-hydrochlorothiazide (MAXZIDE-25)  37.5-25 MG per tablet; Take 1 tablet by mouth daily.  Dispense: 90 tablet; Refill: 4  8. A-fib - POCT INR  Patient Instructions  Fall precautions discussed Return to clinic in September or October for a flu shot Continue current meds and therapeutic lifestyle changes We will try to arrange a visit with [facility] in this office regarding left hip joint pain Patient will discuss with therapist the option of seeing Dr. Laren Everts to help with medication, since he has been sensitive to previous SSRI   Arrie Senate MD

## 2013-08-03 NOTE — Patient Instructions (Addendum)
Fall precautions discussed Return to clinic in September or October for a flu shot Continue current meds and therapeutic lifestyle changes We will try to arrange a visit with [facility] orthopedics in this office regarding left hip joint pain Patient will discuss with therapist the option of seeing Dr. Laren Everts to help with medication, since he has been sensitive to previous SSRI

## 2013-08-03 NOTE — Addendum Note (Signed)
Addended by: Marin Olp on: 08/03/2013 12:59 PM   Modules accepted: Orders, Medications

## 2013-08-12 ENCOUNTER — Other Ambulatory Visit (INDEPENDENT_AMBULATORY_CARE_PROVIDER_SITE_OTHER): Payer: Medicare Other

## 2013-08-12 DIAGNOSIS — Z1212 Encounter for screening for malignant neoplasm of rectum: Secondary | ICD-10-CM | POA: Diagnosis not present

## 2013-08-12 DIAGNOSIS — M1710 Unilateral primary osteoarthritis, unspecified knee: Secondary | ICD-10-CM | POA: Diagnosis not present

## 2013-08-14 LAB — FECAL OCCULT BLOOD, IMMUNOCHEMICAL: Fecal Occult Bld: NEGATIVE

## 2013-08-25 ENCOUNTER — Encounter: Payer: Self-pay | Admitting: *Deleted

## 2013-08-25 DIAGNOSIS — Z7901 Long term (current) use of anticoagulants: Secondary | ICD-10-CM | POA: Diagnosis not present

## 2013-08-25 DIAGNOSIS — I2510 Atherosclerotic heart disease of native coronary artery without angina pectoris: Secondary | ICD-10-CM | POA: Diagnosis not present

## 2013-08-25 DIAGNOSIS — E785 Hyperlipidemia, unspecified: Secondary | ICD-10-CM | POA: Diagnosis not present

## 2013-08-25 DIAGNOSIS — I495 Sick sinus syndrome: Secondary | ICD-10-CM | POA: Diagnosis not present

## 2013-08-25 DIAGNOSIS — I119 Hypertensive heart disease without heart failure: Secondary | ICD-10-CM | POA: Diagnosis not present

## 2013-08-25 DIAGNOSIS — I4891 Unspecified atrial fibrillation: Secondary | ICD-10-CM | POA: Diagnosis not present

## 2013-08-27 ENCOUNTER — Other Ambulatory Visit: Payer: Self-pay | Admitting: Family Medicine

## 2013-09-08 ENCOUNTER — Other Ambulatory Visit: Payer: Self-pay

## 2013-09-08 DIAGNOSIS — E785 Hyperlipidemia, unspecified: Secondary | ICD-10-CM

## 2013-09-08 MED ORDER — MIRTAZAPINE 15 MG PO TABS: ORAL_TABLET | ORAL | Status: DC

## 2013-09-08 MED ORDER — ROSUVASTATIN CALCIUM 5 MG PO TABS: 5.0000 mg | ORAL_TABLET | ORAL | Status: DC

## 2013-09-08 MED ORDER — FLUTICASONE PROPIONATE 50 MCG/ACT NA SUSP: NASAL | Status: DC

## 2013-09-08 MED ORDER — AZELASTINE HCL 0.1 % NA SOLN: NASAL | Status: DC

## 2013-09-08 NOTE — Telephone Encounter (Signed)
Last seen 08/03/13  DWM  Last lipid 04/22/13  If approved print for mail order and route to nurse

## 2013-09-15 ENCOUNTER — Ambulatory Visit (INDEPENDENT_AMBULATORY_CARE_PROVIDER_SITE_OTHER): Payer: Medicare Other | Admitting: Pharmacist Clinician (PhC)/ Clinical Pharmacy Specialist

## 2013-09-15 DIAGNOSIS — I4891 Unspecified atrial fibrillation: Secondary | ICD-10-CM | POA: Diagnosis not present

## 2013-09-15 DIAGNOSIS — Z23 Encounter for immunization: Secondary | ICD-10-CM

## 2013-09-15 LAB — POCT INR: INR: 1.8

## 2013-10-27 ENCOUNTER — Ambulatory Visit (INDEPENDENT_AMBULATORY_CARE_PROVIDER_SITE_OTHER): Payer: Medicare Other | Admitting: Pharmacist Clinician (PhC)/ Clinical Pharmacy Specialist

## 2013-10-27 DIAGNOSIS — I4891 Unspecified atrial fibrillation: Secondary | ICD-10-CM | POA: Diagnosis not present

## 2013-10-27 LAB — POCT INR: INR: 1.6

## 2013-11-03 ENCOUNTER — Ambulatory Visit (INDEPENDENT_AMBULATORY_CARE_PROVIDER_SITE_OTHER): Payer: Medicare Other | Admitting: Family Medicine

## 2013-11-03 ENCOUNTER — Encounter: Payer: Self-pay | Admitting: Family Medicine

## 2013-11-03 VITALS — BP 144/86 | HR 96 | Temp 97.1°F | Ht 71.0 in | Wt 183.6 lb

## 2013-11-03 DIAGNOSIS — IMO0001 Reserved for inherently not codable concepts without codable children: Secondary | ICD-10-CM

## 2013-11-03 DIAGNOSIS — N390 Urinary tract infection, site not specified: Secondary | ICD-10-CM | POA: Diagnosis not present

## 2013-11-03 DIAGNOSIS — R350 Frequency of micturition: Secondary | ICD-10-CM

## 2013-11-03 LAB — POCT URINALYSIS DIPSTICK
Bilirubin, UA: NEGATIVE
Blood, UA: NEGATIVE
Glucose, UA: NEGATIVE
Ketones, UA: NEGATIVE
Leukocytes, UA: NEGATIVE
Nitrite, UA: NEGATIVE
Protein, UA: NEGATIVE
Spec Grav, UA: 1.015
Urobilinogen, UA: NEGATIVE
pH, UA: 6.5

## 2013-11-03 LAB — POCT UA - MICROSCOPIC ONLY
Bacteria, U Microscopic: NEGATIVE
Casts, Ur, LPF, POC: NEGATIVE
Crystals, Ur, HPF, POC: NEGATIVE
Epithelial cells, urine per micros: NEGATIVE
Mucus, UA: NEGATIVE
WBC, Ur, HPF, POC: NEGATIVE

## 2013-11-03 MED ORDER — NITROFURANTOIN MONOHYD MACRO 100 MG PO CAPS: 100.0000 mg | ORAL_CAPSULE | Freq: Two times a day (BID) | ORAL | Status: DC

## 2013-11-03 MED ORDER — CIPROFLOXACIN HCL 500 MG PO TABS: 500.0000 mg | ORAL_TABLET | Freq: Two times a day (BID) | ORAL | Status: DC

## 2013-11-03 NOTE — Patient Instructions (Signed)
Urinary Tract Infection  Urinary tract infections (UTIs) can develop anywhere along your urinary tract. Your urinary tract is your body's drainage system for removing wastes and extra water. Your urinary tract includes two kidneys, two ureters, a bladder, and a urethra. Your kidneys are a pair of bean-shaped organs. Each kidney is about the size of your fist. They are located below your ribs, one on each side of your spine.  CAUSES  Infections are caused by microbes, which are microscopic organisms, including fungi, viruses, and bacteria. These organisms are so small that they can only be seen through a microscope. Bacteria are the microbes that most commonly cause UTIs.  SYMPTOMS   Symptoms of UTIs may vary by age and gender of the patient and by the location of the infection. Symptoms in young women typically include a frequent and intense urge to urinate and a painful, burning feeling in the bladder or urethra during urination. Older women and men are more likely to be tired, shaky, and weak and have muscle aches and abdominal pain. A fever may mean the infection is in your kidneys. Other symptoms of a kidney infection include pain in your back or sides below the ribs, nausea, and vomiting.  DIAGNOSIS  To diagnose a UTI, your caregiver will ask you about your symptoms. Your caregiver also will ask to provide a urine sample. The urine sample will be tested for bacteria and white blood cells. White blood cells are made by your body to help fight infection.  TREATMENT   Typically, UTIs can be treated with medication. Because most UTIs are caused by a bacterial infection, they usually can be treated with the use of antibiotics. The choice of antibiotic and length of treatment depend on your symptoms and the type of bacteria causing your infection.  HOME CARE INSTRUCTIONS   If you were prescribed antibiotics, take them exactly as your caregiver instructs you. Finish the medication even if you feel better after you  have only taken some of the medication.   Drink enough water and fluids to keep your urine clear or pale yellow.   Avoid caffeine, tea, and carbonated beverages. They tend to irritate your bladder.   Empty your bladder often. Avoid holding urine for long periods of time.   Empty your bladder before and after sexual intercourse.   After a bowel movement, women should cleanse from front to back. Use each tissue only once.  SEEK MEDICAL CARE IF:    You have back pain.   You develop a fever.   Your symptoms do not begin to resolve within 3 days.  SEEK IMMEDIATE MEDICAL CARE IF:    You have severe back pain or lower abdominal pain.   You develop chills.   You have nausea or vomiting.   You have continued burning or discomfort with urination.  MAKE SURE YOU:    Understand these instructions.   Will watch your condition.   Will get help right away if you are not doing well or get worse.  Document Released: 09/12/2005 Document Revised: 06/03/2012 Document Reviewed: 01/11/2012  [employer] Patient Information 2014 [employer]

## 2013-11-03 NOTE — Progress Notes (Signed)
  Subjective:    Patient ID: Dalton Vaughan, male    DOB: [birth year], 79 y.o.   MRN: [id number]  HPI This 79 y.o. male presents for evaluation of urinary frequency.  He has hx of  bph and he is on doxazosin which usually works well but he has more frequency and Nocturia over the last few weeks.  He usually goes on abx's and this resolves his Frequency.  He sees urology annually.   Review of Systems C/o urinary frequency   No chest pain, SOB, HA, dizziness, vision change, N/V, diarrhea, constipation,  myalgias, arthralgias or rash.  Objective:   Physical Exam Vital signs noted  Well developed well nourished male.  HEENT - Head atraumatic Normocephalic Respiratory - Lungs CTA bilateral Cardiac - RRR S1 and S2 without murmur  Results for orders placed in visit on 11/03/13  POCT URINALYSIS DIPSTICK      Result Value Range   Color, UA yellow     Clarity, UA clear     Glucose, UA neg     Bilirubin, UA neg     Ketones, UA neg     Spec Grav, UA 1.015     Blood, UA neg     pH, UA 6.5     Protein, UA neg     Urobilinogen, UA negative     Nitrite, UA neg     Leukocytes, UA Negative    POCT UA - MICROSCOPIC ONLY      Result Value Range   WBC, Ur, HPF, POC neg     RBC, urine, microscopic occ     Bacteria, U Microscopic neg     Mucus, UA neg     Epithelial cells, urine per micros neg     Crystals, Ur, HPF, POC neg     Casts, Ur, LPF, POC neg           Assessment & Plan:  Frequency - Plan: POCT urinalysis dipstick, POCT UA - Microscopic Only, ciprofloxacin (CIPRO) 500 MG tablet po bid x 14 days  Lysbeth Penner FNP

## 2013-11-13 ENCOUNTER — Telehealth: Payer: Self-pay | Admitting: Family Medicine

## 2013-11-16 NOTE — Telephone Encounter (Signed)
Patient aware.

## 2013-11-16 NOTE — Telephone Encounter (Signed)
If not having sx's then hold off from taking macrobid

## 2013-12-08 ENCOUNTER — Ambulatory Visit (INDEPENDENT_AMBULATORY_CARE_PROVIDER_SITE_OTHER): Payer: Medicare Other | Admitting: Pharmacist Clinician (PhC)/ Clinical Pharmacy Specialist

## 2013-12-08 DIAGNOSIS — I4891 Unspecified atrial fibrillation: Secondary | ICD-10-CM

## 2013-12-08 LAB — POCT INR: INR: 1.8

## 2013-12-26 ENCOUNTER — Other Ambulatory Visit: Payer: Self-pay | Admitting: Family Medicine

## 2014-01-04 ENCOUNTER — Encounter: Payer: Self-pay | Admitting: Family Medicine

## 2014-01-04 ENCOUNTER — Ambulatory Visit (INDEPENDENT_AMBULATORY_CARE_PROVIDER_SITE_OTHER): Payer: Medicare Other | Admitting: Family Medicine

## 2014-01-04 ENCOUNTER — Encounter (INDEPENDENT_AMBULATORY_CARE_PROVIDER_SITE_OTHER): Payer: Self-pay

## 2014-01-04 VITALS — BP 138/80 | HR 50 | Temp 97.1°F | Ht 71.0 in | Wt 179.0 lb

## 2014-01-04 DIAGNOSIS — K219 Gastro-esophageal reflux disease without esophagitis: Secondary | ICD-10-CM

## 2014-01-04 DIAGNOSIS — Z23 Encounter for immunization: Secondary | ICD-10-CM | POA: Diagnosis not present

## 2014-01-04 DIAGNOSIS — I2510 Atherosclerotic heart disease of native coronary artery without angina pectoris: Secondary | ICD-10-CM | POA: Diagnosis not present

## 2014-01-04 DIAGNOSIS — E559 Vitamin D deficiency, unspecified: Secondary | ICD-10-CM

## 2014-01-04 DIAGNOSIS — I4891 Unspecified atrial fibrillation: Secondary | ICD-10-CM

## 2014-01-04 DIAGNOSIS — E785 Hyperlipidemia, unspecified: Secondary | ICD-10-CM

## 2014-01-04 DIAGNOSIS — E039 Hypothyroidism, unspecified: Secondary | ICD-10-CM

## 2014-01-04 DIAGNOSIS — I119 Hypertensive heart disease without heart failure: Secondary | ICD-10-CM

## 2014-01-04 MED ORDER — MIRTAZAPINE 15 MG PO TABS: ORAL_TABLET | ORAL | Status: DC

## 2014-01-04 NOTE — Progress Notes (Signed)
Subjective:    Patient ID: [name], male    DOB: [birth year], 80 y.o.   MRN: [id number]  HPI Pt here for follow up and management of chronic medical problems. This patient is followed by the cardiologist the urologist and orthopedist for his related problems to their specialty. He is due to get lab work. He is also due for a Prevnar vaccine. He is not fasting so he will return to clinic per the lab work. He will see the urologist, Dr. Alinda Money in June. He is followed for his protimes here but Dr. Wynonia Lawman is his cardiologist .         Patient Active Problem List   Diagnosis Date Noted  . Depression 08/03/2013  . Sick sinus syndrome 07/31/2012  . hypertension   . Atrial fibrillation   . CAD (coronary artery disease)   . History of TIAs   . Lumbar disc disease   . GERD (gastroesophageal reflux disease)   . Long-term (current) use of anticoagulants   . BPH (benign prostatic hypertrophy)   . Hyperlipidemia    Outpatient Encounter Prescriptions as of 01/04/2014  Medication Sig  . amLODipine (NORVASC) 5 MG tablet Take 0.5 tablets (2.5 mg total) by mouth 2 (two) times daily.  Marland Kitchen aspirin (SB LOW DOSE ASA EC) 81 MG EC tablet Take 81 mg by mouth daily.    Marland Kitchen azelastine (ASTELIN) 137 MCG/SPRAY nasal spray Use 1 -2 sprays in each nostril twice a day  . Calcium Carbonate-Vitamin D (CALCIUM 600 + D PO) Take 1 tablet by mouth daily.   . Cholecalciferol (VITAMIN D-3) 5000 UNITS TABS Take 1 tablet by mouth daily.   . Coenzyme Q10 (CO Q 10) 100 MG CAPS Take 1 capsule by mouth daily.  Marland Kitchen doxazosin (CARDURA) 8 MG tablet Take 1 tablet (8 mg total) by mouth at bedtime.  . fluticasone (FLONASE) 50 MCG/ACT nasal spray Use one to two sprays in each nostril daily  . folic acid (FOLVITE) 1 MG tablet TAKE 1 TABLET DAILY  . levothyroxine (SYNTHROID, LEVOTHROID) 25 MCG tablet TAKE 1 TABLET DAILY  . mirtazapine (REMERON) 15 MG tablet Take one tablet daily  . Multiple Vitamin (MULTIVITAMIN) tablet Take 1 tablet  by mouth daily.    . ramipril (ALTACE) 10 MG tablet Take 1 tablet (10 mg total) by mouth daily.  . rosuvastatin (CRESTOR) 5 MG tablet Take 1 tablet (5 mg total) by mouth 2 (two) times a week. Takes on Tuesday and Thursday  . triamterene-hydrochlorothiazide (MAXZIDE-25) 37.5-25 MG per tablet Take 1 tablet by mouth daily.  . vitamin C (ASCORBIC ACID) 500 MG tablet Take 500 mg by mouth daily. Take 1/4 tablet by mouth daily.  Marland Kitchen warfarin (COUMADIN) 5 MG tablet TAKE AS DIRECTED  . NEXIUM 40 MG capsule TAKE 1 CAPSULE DAILY  . VIAGRA 100 MG tablet TAKE 1 TABLET EVERY DAY AS DIRECTED  . [DISCONTINUED] ciprofloxacin (CIPRO) 500 MG tablet Take 1 tablet (500 mg total) by mouth 2 (two) times daily.  . [DISCONTINUED] nitrofurantoin, macrocrystal-monohydrate, (MACROBID) 100 MG capsule Take 1 capsule (100 mg total) by mouth 2 (two) times daily.    Review of Systems  Constitutional: Negative.   HENT: Negative.   Eyes: Negative.   Respiratory: Negative.   Cardiovascular: Negative.   Gastrointestinal: Negative.   Endocrine: Negative.   Genitourinary: Negative.   Musculoskeletal: Positive for arthralgias (bilateral knee pain and knee weakness AND pelvic pain (seen Alvan Dame for this)).  Skin: Negative.   Allergic/Immunologic: Negative.  Neurological: Negative.   Hematological: Negative.   Psychiatric/Behavioral: Negative.        Objective:   Physical Exam  Nursing note and vitals reviewed. Constitutional: He is oriented to person, place, and time. He appears well-developed and well-nourished. No distress.  HENT:  Head: Normocephalic and atraumatic.  Right Ear: External ear normal.  Left Ear: External ear normal.  Nose: Nose normal.  Mouth/Throat: Oropharynx is clear and moist. No oropharyngeal exudate.  Eyes: Conjunctivae and EOM are normal. Pupils are equal, round, and reactive to light. Right eye exhibits no discharge. Left eye exhibits no discharge. No scleral icterus.  Neck: Normal range of  motion. Neck supple. No tracheal deviation present. No thyromegaly present.  No carotid bruits audible  Cardiovascular: Normal rate, normal heart sounds and intact distal pulses.  Exam reveals no gallop and no friction rub.   No murmur heard. Irregular irregular at 60 per minute  Pulmonary/Chest: Effort normal and breath sounds normal. No respiratory distress. He has no wheezes. He has no rales. He exhibits no tenderness.  No axillary adenopathy  Abdominal: Soft. Bowel sounds are normal. He exhibits no mass. There is no tenderness. There is no rebound and no guarding.  Musculoskeletal: Normal range of motion. He exhibits no edema and no tenderness.  Lymphadenopathy:    He has no cervical adenopathy.  Neurological: He is alert and oriented to person, place, and time. He has normal reflexes. No cranial nerve deficit.  Skin: Skin is warm and dry. No rash noted.  Psychiatric: He has a normal mood and affect. His behavior is normal. Judgment and thought content normal.   BP 138/80  Pulse 50  Temp(Src) 97.1 F (36.2 C) (Oral)  Ht 5' 11"  (1.803 m)  Wt 179 lb (81.194 kg)  BMI 24.98 kg/m2        Assessment & Plan:  1. Atrial fibrillation - POCT CBC; Future  2. CAD (coronary artery disease) - POCT CBC; Future  3. GERD (gastroesophageal reflux disease) - POCT CBC; Future  4. Hyperlipidemia - POCT CBC; Future - NMR, lipoprofile; Future  5. hypertension - POCT CBC; Future - BMP8+EGFR; Future - Hepatic function panel; Future  6. Hypothyroid - Thyroid Panel With TSH; Future  7. Vitamin D deficiency - Vit D  25 hydroxy (rtn osteoporosis monitoring); Future  8. Depression is stable -Continue medications  Meds ordered this encounter  Medications  . mirtazapine (REMERON) 15 MG tablet    Sig: Take one tablet daily    Dispense:  90 tablet    Refill:  3   Patient Instructions  Continue current medications. Continue good therapeutic lifestyle changes which include good diet  and exercise. Fall precautions discussed with patient. Schedule your flu vaccine if you haven't had it yet If you are over 78 years old - you may need Prevnar 36 or the adult Pneumonia vaccine. You will receive a Prevnar vaccine today and this may make your arm sore We will check into whether you need a Tdap or not  Keep followup appointments with  the cardiologist and the urologist Take Tylenol for aches and pains     Arrie Senate MD

## 2014-01-04 NOTE — Patient Instructions (Addendum)
Continue current medications. Continue good therapeutic lifestyle changes which include good diet and exercise. Fall precautions discussed with patient. Schedule your flu vaccine if you haven't had it yet If you are over 78 years old - you may need Prevnar 57 or the adult Pneumonia vaccine. You will receive a Prevnar vaccine today and this may make your arm sore We will check into whether you need a Tdap or not  Keep followup appointments with  the cardiologist and the urologist Take Tylenol for aches and pains

## 2014-01-04 NOTE — Addendum Note (Signed)
Addended by: Zannie Cove on: 01/04/2014 11:19 AM   Modules accepted: Orders

## 2014-01-18 ENCOUNTER — Other Ambulatory Visit (INDEPENDENT_AMBULATORY_CARE_PROVIDER_SITE_OTHER): Payer: Medicare Other

## 2014-01-18 DIAGNOSIS — E785 Hyperlipidemia, unspecified: Secondary | ICD-10-CM | POA: Diagnosis not present

## 2014-01-18 DIAGNOSIS — E559 Vitamin D deficiency, unspecified: Secondary | ICD-10-CM

## 2014-01-18 DIAGNOSIS — I2510 Atherosclerotic heart disease of native coronary artery without angina pectoris: Secondary | ICD-10-CM

## 2014-01-18 DIAGNOSIS — E039 Hypothyroidism, unspecified: Secondary | ICD-10-CM | POA: Diagnosis not present

## 2014-01-18 DIAGNOSIS — K219 Gastro-esophageal reflux disease without esophagitis: Secondary | ICD-10-CM

## 2014-01-18 DIAGNOSIS — I4891 Unspecified atrial fibrillation: Secondary | ICD-10-CM | POA: Diagnosis not present

## 2014-01-18 DIAGNOSIS — I119 Hypertensive heart disease without heart failure: Secondary | ICD-10-CM

## 2014-01-18 LAB — POCT CBC
Granulocyte percent: 52.4 %G (ref 37–80)
HCT, POC: 42.9 % — AB (ref 43.5–53.7)
Hemoglobin: 13.6 g/dL — AB (ref 14.1–18.1)
Lymph, poc: 2.3 (ref 0.6–3.4)
MCH, POC: 28.7 pg (ref 27–31.2)
MCHC: 31.8 g/dL (ref 31.8–35.4)
MCV: 90.2 fL (ref 80–97)
MPV: 9.6 fL (ref 0–99.8)
POC Granulocyte: 2.8 (ref 2–6.9)
POC LYMPH PERCENT: 43.3 %L (ref 10–50)
Platelet Count, POC: 135 10*3/uL — AB (ref 142–424)
RBC: 4.8 M/uL (ref 4.69–6.13)
RDW, POC: 15.7 %
WBC: 5.3 10*3/uL (ref 4.6–10.2)

## 2014-01-19 ENCOUNTER — Ambulatory Visit (INDEPENDENT_AMBULATORY_CARE_PROVIDER_SITE_OTHER): Payer: Medicare Other | Admitting: Pharmacist Clinician (PhC)/ Clinical Pharmacy Specialist

## 2014-01-19 DIAGNOSIS — I4891 Unspecified atrial fibrillation: Secondary | ICD-10-CM | POA: Diagnosis not present

## 2014-01-19 LAB — POCT INR: INR: 1.8

## 2014-01-20 LAB — NMR, LIPOPROFILE
Cholesterol: 177 mg/dL (ref <200)
HDL Cholesterol by NMR: 48 mg/dL (ref >=40)
HDL Particle Number: 29 umol/L — ABNORMAL LOW (ref >=30.5)
LDL Particle Number: 1525 nmol/L — ABNORMAL HIGH (ref <1000)
LDL Size: 21.2 nm (ref >20.5)
LDLC SERPL CALC-MCNC: 113 mg/dL — ABNORMAL HIGH (ref <100)
LP-IR Score: <25 (ref <=45)
Small LDL Particle Number: 584 nmol/L — ABNORMAL HIGH (ref <=527)
Triglycerides by NMR: 82 mg/dL (ref <150)

## 2014-01-20 LAB — HEPATIC FUNCTION PANEL
ALT: 9 IU/L (ref 0–44)
AST: 17 IU/L (ref 0–40)
Albumin: 4.1 g/dL (ref 3.5–4.7)
Alkaline Phosphatase: 88 IU/L (ref 39–117)
Bilirubin, Direct: 0.15 mg/dL (ref 0.00–0.40)
Total Bilirubin: 0.4 mg/dL (ref 0.0–1.2)
Total Protein: 6.3 g/dL (ref 6.0–8.5)

## 2014-01-20 LAB — BMP8+EGFR
BUN/Creatinine Ratio: 11 (ref 10–22)
BUN: 9 mg/dL (ref 8–27)
CO2: 30 mmol/L — ABNORMAL HIGH (ref 18–29)
Calcium: 9.5 mg/dL (ref 8.6–10.2)
Chloride: 104 mmol/L (ref 97–108)
Creatinine, Ser: 0.8 mg/dL (ref 0.76–1.27)
GFR calc Af Amer: 96 mL/min/{1.73_m2} (ref >59)
GFR calc non Af Amer: 83 mL/min/{1.73_m2} (ref >59)
Glucose: 90 mg/dL (ref 65–99)
Potassium: 4.1 mmol/L (ref 3.5–5.2)
Sodium: 145 mmol/L — ABNORMAL HIGH (ref 134–144)

## 2014-01-20 LAB — VITAMIN D 25 HYDROXY (VIT D DEFICIENCY, FRACTURES): Vit D, 25-Hydroxy: 41.5 ng/mL (ref 30.0–100.0)

## 2014-01-20 LAB — THYROID PANEL WITH TSH
Free Thyroxine Index: 2 (ref 1.2–4.9)
T3 Uptake Ratio: 31 % (ref 24–39)
T4, Total: 6.4 ug/dL (ref 4.5–12.0)
TSH: 4.19 u[IU]/mL (ref 0.450–4.500)

## 2014-01-31 ENCOUNTER — Other Ambulatory Visit: Payer: Self-pay | Admitting: Family Medicine

## 2014-02-27 ENCOUNTER — Other Ambulatory Visit: Payer: Self-pay | Admitting: Family Medicine

## 2014-03-02 ENCOUNTER — Ambulatory Visit (INDEPENDENT_AMBULATORY_CARE_PROVIDER_SITE_OTHER): Payer: Medicare Other | Admitting: Pharmacist Clinician (PhC)/ Clinical Pharmacy Specialist

## 2014-03-02 DIAGNOSIS — I4891 Unspecified atrial fibrillation: Secondary | ICD-10-CM

## 2014-03-02 LAB — POCT INR: INR: 1.8

## 2014-03-04 ENCOUNTER — Other Ambulatory Visit: Payer: Self-pay | Admitting: Family Medicine

## 2014-03-05 DIAGNOSIS — I2510 Atherosclerotic heart disease of native coronary artery without angina pectoris: Secondary | ICD-10-CM | POA: Diagnosis not present

## 2014-03-05 DIAGNOSIS — Z7901 Long term (current) use of anticoagulants: Secondary | ICD-10-CM | POA: Diagnosis not present

## 2014-03-05 DIAGNOSIS — E785 Hyperlipidemia, unspecified: Secondary | ICD-10-CM | POA: Diagnosis not present

## 2014-03-05 DIAGNOSIS — I4891 Unspecified atrial fibrillation: Secondary | ICD-10-CM | POA: Diagnosis not present

## 2014-03-05 DIAGNOSIS — I119 Hypertensive heart disease without heart failure: Secondary | ICD-10-CM | POA: Diagnosis not present

## 2014-03-05 DIAGNOSIS — I495 Sick sinus syndrome: Secondary | ICD-10-CM | POA: Diagnosis not present

## 2014-03-06 ENCOUNTER — Other Ambulatory Visit: Payer: Self-pay | Admitting: Family Medicine

## 2014-04-13 ENCOUNTER — Ambulatory Visit (INDEPENDENT_AMBULATORY_CARE_PROVIDER_SITE_OTHER): Payer: Medicare Other | Admitting: Pharmacist Clinician (PhC)/ Clinical Pharmacy Specialist

## 2014-04-13 DIAGNOSIS — I4891 Unspecified atrial fibrillation: Secondary | ICD-10-CM | POA: Diagnosis not present

## 2014-04-13 LAB — POCT INR: INR: 1.6

## 2014-04-17 ENCOUNTER — Other Ambulatory Visit: Payer: Self-pay | Admitting: Family Medicine

## 2014-05-06 ENCOUNTER — Encounter: Payer: Self-pay | Admitting: Family Medicine

## 2014-05-06 ENCOUNTER — Ambulatory Visit (INDEPENDENT_AMBULATORY_CARE_PROVIDER_SITE_OTHER): Payer: Medicare Other | Admitting: Family Medicine

## 2014-05-06 VITALS — BP 160/97 | HR 58 | Temp 97.3°F | Ht 71.0 in | Wt 179.0 lb

## 2014-05-06 DIAGNOSIS — E039 Hypothyroidism, unspecified: Secondary | ICD-10-CM

## 2014-05-06 DIAGNOSIS — M25569 Pain in unspecified knee: Secondary | ICD-10-CM

## 2014-05-06 DIAGNOSIS — E785 Hyperlipidemia, unspecified: Secondary | ICD-10-CM

## 2014-05-06 DIAGNOSIS — I2510 Atherosclerotic heart disease of native coronary artery without angina pectoris: Secondary | ICD-10-CM

## 2014-05-06 DIAGNOSIS — I119 Hypertensive heart disease without heart failure: Secondary | ICD-10-CM

## 2014-05-06 DIAGNOSIS — N400 Enlarged prostate without lower urinary tract symptoms: Secondary | ICD-10-CM

## 2014-05-06 DIAGNOSIS — M25562 Pain in left knee: Secondary | ICD-10-CM

## 2014-05-06 DIAGNOSIS — J309 Allergic rhinitis, unspecified: Secondary | ICD-10-CM

## 2014-05-06 DIAGNOSIS — I4891 Unspecified atrial fibrillation: Secondary | ICD-10-CM

## 2014-05-06 DIAGNOSIS — K219 Gastro-esophageal reflux disease without esophagitis: Secondary | ICD-10-CM

## 2014-05-06 DIAGNOSIS — I10 Essential (primary) hypertension: Secondary | ICD-10-CM

## 2014-05-06 DIAGNOSIS — E559 Vitamin D deficiency, unspecified: Secondary | ICD-10-CM

## 2014-05-06 DIAGNOSIS — M25561 Pain in right knee: Secondary | ICD-10-CM

## 2014-05-06 MED ORDER — RAMIPRIL 10 MG PO CAPS: 10.0000 mg | ORAL_CAPSULE | Freq: Every day | ORAL | Status: DC

## 2014-05-06 MED ORDER — ROSUVASTATIN CALCIUM 10 MG PO TABS: 10.0000 mg | ORAL_TABLET | Freq: Every day | ORAL | Status: DC

## 2014-05-06 MED ORDER — DOXYCYCLINE HYCLATE 100 MG PO TABS
100.0000 mg | ORAL_TABLET | Freq: Two times a day (BID) | ORAL | Status: DC
Start: 2014-05-06 — End: 2014-06-29

## 2014-05-06 NOTE — Progress Notes (Signed)
Subjective:    Patient ID: Dalton Vaughan, male    DOB: May 24, 1929, 80 y.o.   MRN: [id number]  HPI Pt here for follow up and management of chronic medical problems. The patient comes today complaining of a lot of arthralgias. His health maintenance parameters are up-to-date and a future order was put in for lab work. The patient is especially complaining with his back hips and knees and indicates that the pain is much worse when he gets up in the morning and seemed to get better during the day his knees bother him especially with climbing up and down steps. He has seen orthopedists in the past. Past x-rays were over 2 years ago. He continues to see his cardiologist about every 6 months.        Patient Active Problem List   Diagnosis Date Noted  . Depression 08/03/2013  . Sick sinus syndrome 07/31/2012  . hypertension   . Atrial fibrillation   . CAD (coronary artery disease)   . History of TIAs   . Lumbar disc disease   . GERD (gastroesophageal reflux disease)   . Long-term (current) use of anticoagulants   . BPH (benign prostatic hypertrophy)   . Hyperlipidemia    Outpatient Encounter Prescriptions as of 05/06/2014  Medication Sig  . amLODipine (NORVASC) 5 MG tablet TAKE ONE-HALF (1/2) TABLET (2.5 MG) TWICE A DAY  . azelastine (ASTELIN) 137 MCG/SPRAY nasal spray USE 1 TO 2 SPRAYS IN EACH NOSTRIL TWICE A DAY  . Calcium Carbonate-Vitamin D (CALCIUM 600 + D PO) Take 1 tablet by mouth daily.   . Cholecalciferol (VITAMIN D-3) 5000 UNITS TABS Take 1 tablet by mouth daily.   . Coenzyme Q10 (CO Q 10) 100 MG CAPS Take 1 capsule by mouth daily.  Marland Kitchen doxazosin (CARDURA) 8 MG tablet TAKE 1 TABLET AT BEDTIME  . fluticasone (FLONASE) 50 MCG/ACT nasal spray USE 1 TO 2 SPRAYS IN EACH NOSTRIL DAILY  . folic acid (FOLVITE) 1 MG tablet TAKE 1 TABLET DAILY  . levothyroxine (SYNTHROID, LEVOTHROID) 25 MCG tablet TAKE 1 TABLET DAILY  . mirtazapine (REMERON) 15 MG tablet TAKE 1 TABLET DAILY  . Multiple  Vitamin (MULTIVITAMIN) tablet Take 1 tablet by mouth daily.    Marland Kitchen NEXIUM 40 MG capsule TAKE 1 CAPSULE DAILY  . ramipril (ALTACE) 10 MG tablet Take 1 tablet (10 mg total) by mouth daily.  . rosuvastatin (CRESTOR) 5 MG tablet Take 1 tablet (5 mg total) by mouth 2 (two) times a week. Takes on Tuesday and Thursday  . triamterene-hydrochlorothiazide (MAXZIDE-25) 37.5-25 MG per tablet Take 1 tablet by mouth daily.  . vitamin C (ASCORBIC ACID) 500 MG tablet Take 500 mg by mouth daily. Take 1/4 tablet by mouth daily.  Marland Kitchen warfarin (COUMADIN) 5 MG tablet TAKE AS DIRECTED  . [DISCONTINUED] aspirin (SB LOW DOSE ASA EC) 81 MG EC tablet Take 81 mg by mouth daily.    Marland Kitchen VIAGRA 100 MG tablet TAKE 1 TABLET EVERY DAY AS DIRECTED    Review of Systems  Constitutional: Negative.   HENT: Negative.   Eyes: Negative.   Respiratory: Negative.   Cardiovascular: Negative.   Gastrointestinal: Negative.   Endocrine: Negative.   Genitourinary: Negative.   Musculoskeletal: Positive for arthralgias (bilateral hips, knees, legs, ankles).  Skin: Negative.   Allergic/Immunologic: Negative.   Neurological: Negative.   Hematological: Negative.   Psychiatric/Behavioral: Negative.        Objective:   Physical Exam  Nursing note and vitals reviewed. Constitutional: He is  oriented to person, place, and time. He appears well-developed and well-nourished. No distress.  HENT:  Head: Normocephalic and atraumatic.  Right Ear: External ear normal.  Left Ear: External ear normal.  Nose: Nose normal.  Mouth/Throat: Oropharynx is clear and moist. No oropharyngeal exudate.  Eyes: Conjunctivae and EOM are normal. Pupils are equal, round, and reactive to light. Right eye exhibits no discharge. Left eye exhibits no discharge. No scleral icterus.  Neck: Normal range of motion. Neck supple. No thyromegaly present.  No carotid bruits  Cardiovascular: Normal rate, normal heart sounds and intact distal pulses.  Exam reveals no gallop  and no friction rub.   No murmur heard. Irregular irregular at 96 per minute  Pulmonary/Chest: Effort normal and breath sounds normal. No respiratory distress. He has no wheezes. He has no rales. He exhibits no tenderness.  Abdominal: Soft. Bowel sounds are normal. He exhibits no mass. There is no tenderness. There is no rebound and no guarding.  Musculoskeletal: Normal range of motion. He exhibits no edema and no tenderness.  With knee movement there was no tenderness. He appeared to have good ability to get on the exam table without pain.  Lymphadenopathy:    He has no cervical adenopathy.  Neurological: He is alert and oriented to person, place, and time. He has normal reflexes. No cranial nerve deficit.  Skin: Skin is warm and dry. No rash noted.  Psychiatric: He has a normal mood and affect. His behavior is normal. Judgment and thought content normal.   BP 153/86  Pulse 58  Temp(Src) 97.3 F (36.3 C) (Oral)  Ht 5' 11" (1.803 m)  Wt 179 lb (81.194 kg)  BMI 24.98 kg/m2        Assessment & Plan:  1. Atrial fibrillation  - POCT CBC; Future  2. BPH (benign prostatic hypertrophy) - POCT CBC; Future  3. CAD (coronary artery disease) - POCT CBC; Future  4. GERD (gastroesophageal reflux disease) - POCT CBC; Future  5. Hyperlipidemia - POCT CBC; Future - NMR, lipoprofile; Future  6. hypertension - POCT CBC; Future - BMP8+EGFR; Future - Hepatic function panel; Future  7. hypertension  8. Hyperlipemia - rosuvastatin (CRESTOR) 10 MG tablet; Take 1 tablet (10 mg total) by mouth at bedtime.  Dispense: 90 tablet; Refill: 1  9. Hypothyroidism - POCT CBC; Future - Thyroid Panel With TSH; Future  10. Vitamin D deficiency - Vit D  25 hydroxy (rtn osteoporosis monitoring); Future  11. Allergic rhinitis   Meds ordered this encounter  Medications  . doxycycline (VIBRA-TABS) 100 MG tablet    Sig: Take 1 tablet (100 mg total) by mouth 2 (two) times daily.    Dispense:   30 tablet    Refill:  0  . rosuvastatin (CRESTOR) 10 MG tablet    Sig: Take 1 tablet (10 mg total) by mouth at bedtime.    Dispense:  90 tablet    Refill:  1  . ramipril (ALTACE) 10 MG capsule    Sig: Take 1 capsule (10 mg total) by mouth daily.    Dispense:  90 capsule    Refill:  3   Patient Instructions                       Medicare Annual Wellness Visit  [place] and the medical providers at [facility] strive to bring you the best medical care.  In doing so we not only want to address your current medical conditions   and concerns but also to detect new conditions early and prevent illness, disease and health-related problems.    Medicare offers a yearly Wellness Visit which allows our clinical staff to assess your need for preventative services including immunizations, lifestyle education, counseling to decrease risk of preventable diseases and screening for fall risk and other medical concerns.    This visit is provided free of charge (no copay) for all Medicare recipients. The clinical pharmacists at [facility] have begun to conduct these Wellness Visits which will also include a thorough review of all your medications.    As you primary medical provider recommend that you make an appointment for your Annual Wellness Visit if you have not done so already this year.  You may set up this appointment before you leave today or you may call back ([phone]) and schedule an appointment.  Please make sure when you call that you mention that you are scheduling your Annual Wellness Visit with the clinical pharmacist so that the appointment may be made for the proper length of time.       Continue current medications. Continue good therapeutic lifestyle changes which include good diet and exercise. Fall precautions discussed with patient. If an FOBT was given today- please return it to our front desk. If you are over 50 years old - you may  need Prevnar 13 or the adult Pneumonia vaccine.  Return to clinic for fasting lab work as planned We will arrange for you to have a visit with the orthopedic surgeon in [place] Continue followup visits with the cardiologist   Don W.  MD   

## 2014-05-06 NOTE — Patient Instructions (Addendum)
Medicare Annual Wellness Visit  [place] and the medical providers at [facility] strive to bring you the best medical care.  In doing so we not only want to address your current medical conditions and concerns but also to detect new conditions early and prevent illness, disease and health-related problems.    Medicare offers a yearly Wellness Visit which allows our clinical staff to assess your need for preventative services including immunizations, lifestyle education, counseling to decrease risk of preventable diseases and screening for fall risk and other medical concerns.    This visit is provided free of charge (no copay) for all Medicare recipients. The clinical pharmacists at [facility] have begun to conduct these Wellness Visits which will also include a thorough review of all your medications.    As you primary medical provider recommend that you make an appointment for your Annual Wellness Visit if you have not done so already this year.  You may set up this appointment before you leave today or you may call back ([phone]) and schedule an appointment.  Please make sure when you call that you mention that you are scheduling your Annual Wellness Visit with the clinical pharmacist so that the appointment may be made for the proper length of time.       Continue current medications. Continue good therapeutic lifestyle changes which include good diet and exercise. Fall precautions discussed with patient. If an FOBT was given today- please return it to our front desk. If you are over 32 years old - you may need Prevnar 28 or the adult Pneumonia vaccine.  Return to clinic for fasting lab work as planned We will arrange for you to have a visit with the orthopedic surgeon in [place] followup visits with the cardiologist

## 2014-05-06 NOTE — Addendum Note (Signed)
Addended by: Zannie Cove on: 05/06/2014 09:19 AM   Modules accepted: Orders

## 2014-05-13 ENCOUNTER — Other Ambulatory Visit: Payer: Self-pay | Admitting: Family Medicine

## 2014-05-13 ENCOUNTER — Other Ambulatory Visit (INDEPENDENT_AMBULATORY_CARE_PROVIDER_SITE_OTHER): Payer: Medicare Other

## 2014-05-13 DIAGNOSIS — I119 Hypertensive heart disease without heart failure: Secondary | ICD-10-CM

## 2014-05-13 DIAGNOSIS — I4891 Unspecified atrial fibrillation: Secondary | ICD-10-CM | POA: Diagnosis not present

## 2014-05-13 DIAGNOSIS — E559 Vitamin D deficiency, unspecified: Secondary | ICD-10-CM

## 2014-05-13 DIAGNOSIS — K219 Gastro-esophageal reflux disease without esophagitis: Secondary | ICD-10-CM | POA: Diagnosis not present

## 2014-05-13 DIAGNOSIS — E785 Hyperlipidemia, unspecified: Secondary | ICD-10-CM | POA: Diagnosis not present

## 2014-05-13 DIAGNOSIS — N400 Enlarged prostate without lower urinary tract symptoms: Secondary | ICD-10-CM | POA: Diagnosis not present

## 2014-05-13 DIAGNOSIS — E039 Hypothyroidism, unspecified: Secondary | ICD-10-CM | POA: Diagnosis not present

## 2014-05-13 DIAGNOSIS — I2510 Atherosclerotic heart disease of native coronary artery without angina pectoris: Secondary | ICD-10-CM

## 2014-05-13 LAB — POCT CBC
Granulocyte percent: 62 %G (ref 37–80)
HCT, POC: 40.8 % — AB (ref 43.5–53.7)
Hemoglobin: 13.4 g/dL — AB (ref 14.1–18.1)
Lymph, poc: 2 (ref 0.6–3.4)
MCH, POC: 29.7 pg (ref 27–31.2)
MCHC: 33 g/dL (ref 31.8–35.4)
MCV: 90 fL (ref 80–97)
MPV: 9.5 fL (ref 0–99.8)
POC Granulocyte: 3.5 (ref 2–6.9)
POC LYMPH PERCENT: 35.7 %L (ref 10–50)
Platelet Count, POC: 135 10*3/uL — AB (ref 142–424)
RBC: 4.5 M/uL — AB (ref 4.69–6.13)
RDW, POC: 15 %
WBC: 5.7 10*3/uL (ref 4.6–10.2)

## 2014-05-13 NOTE — Progress Notes (Signed)
PAtient came in for labs only

## 2014-05-14 LAB — HEPATIC FUNCTION PANEL
ALT: 10 IU/L (ref 0–44)
AST: 16 IU/L (ref 0–40)
Albumin: 4.2 g/dL (ref 3.5–4.7)
Alkaline Phosphatase: 90 IU/L (ref 39–117)
Bilirubin, Direct: 0.18 mg/dL (ref 0.00–0.40)
Total Bilirubin: 0.6 mg/dL (ref 0.0–1.2)
Total Protein: 6.2 g/dL (ref 6.0–8.5)

## 2014-05-14 LAB — NMR, LIPOPROFILE
Cholesterol: 172 mg/dL (ref 100–199)
HDL Cholesterol by NMR: 44 mg/dL (ref >39)
HDL Particle Number: 26.3 umol/L — ABNORMAL LOW (ref >=30.5)
LDL Particle Number: 1242 nmol/L — ABNORMAL HIGH (ref <1000)
LDL Size: 21.1 nm (ref >20.5)
LDLC SERPL CALC-MCNC: 112 mg/dL — ABNORMAL HIGH (ref 0–99)
LP-IR Score: 26 (ref <=45)
Small LDL Particle Number: 468 nmol/L (ref <=527)
Triglycerides by NMR: 78 mg/dL (ref 0–149)

## 2014-05-14 LAB — BMP8+EGFR
BUN/Creatinine Ratio: 11 (ref 10–22)
BUN: 9 mg/dL (ref 8–27)
CO2: 29 mmol/L (ref 18–29)
Calcium: 9.2 mg/dL (ref 8.6–10.2)
Chloride: 102 mmol/L (ref 97–108)
Creatinine, Ser: 0.79 mg/dL (ref 0.76–1.27)
GFR calc Af Amer: 97 mL/min/{1.73_m2} (ref >59)
GFR calc non Af Amer: 84 mL/min/{1.73_m2} (ref >59)
Glucose: 86 mg/dL (ref 65–99)
Potassium: 3.5 mmol/L (ref 3.5–5.2)
Sodium: 144 mmol/L (ref 134–144)

## 2014-05-14 LAB — THYROID PANEL WITH TSH
Free Thyroxine Index: 2.1 (ref 1.2–4.9)
T3 Uptake Ratio: 33 % (ref 24–39)
T4, Total: 6.4 ug/dL (ref 4.5–12.0)
TSH: 3.71 u[IU]/mL (ref 0.450–4.500)

## 2014-05-14 LAB — VITAMIN D 25 HYDROXY (VIT D DEFICIENCY, FRACTURES): Vit D, 25-Hydroxy: 38.6 ng/mL (ref 30.0–100.0)

## 2014-05-15 ENCOUNTER — Other Ambulatory Visit: Payer: Self-pay | Admitting: Family Medicine

## 2014-05-19 DIAGNOSIS — R972 Elevated prostate specific antigen [PSA]: Secondary | ICD-10-CM | POA: Diagnosis not present

## 2014-05-19 DIAGNOSIS — N401 Enlarged prostate with lower urinary tract symptoms: Secondary | ICD-10-CM | POA: Diagnosis not present

## 2014-05-22 ENCOUNTER — Other Ambulatory Visit: Payer: Self-pay | Admitting: Family Medicine

## 2014-05-25 ENCOUNTER — Ambulatory Visit (INDEPENDENT_AMBULATORY_CARE_PROVIDER_SITE_OTHER): Payer: Medicare Other | Admitting: Pharmacist Clinician (PhC)/ Clinical Pharmacy Specialist

## 2014-05-25 DIAGNOSIS — I4891 Unspecified atrial fibrillation: Secondary | ICD-10-CM

## 2014-05-25 LAB — POCT INR: INR: 2

## 2014-06-01 ENCOUNTER — Encounter: Payer: Self-pay | Admitting: *Deleted

## 2014-06-03 ENCOUNTER — Other Ambulatory Visit: Payer: Self-pay | Admitting: Family Medicine

## 2014-06-06 ENCOUNTER — Other Ambulatory Visit: Payer: Self-pay | Admitting: Pharmacist

## 2014-06-09 DIAGNOSIS — M1710 Unilateral primary osteoarthritis, unspecified knee: Secondary | ICD-10-CM | POA: Diagnosis not present

## 2014-06-29 ENCOUNTER — Ambulatory Visit (INDEPENDENT_AMBULATORY_CARE_PROVIDER_SITE_OTHER): Payer: Medicare Other | Admitting: Pharmacist

## 2014-06-29 DIAGNOSIS — I4891 Unspecified atrial fibrillation: Secondary | ICD-10-CM

## 2014-06-29 LAB — POCT INR: INR: 1.7

## 2014-06-29 NOTE — Patient Instructions (Signed)
Anticoagulation Dose Instructions as of 06/29/2014     Dalton Vaughan Tue Wed Thu Fri Sat   New Dose 2.5 mg 5 mg 2.5 mg 5 mg 2.5 mg 5 mg 2.5 mg    Description       Continue sam warfarin dose of 1 tablet on mondays, wednesdays and fridays and 1/2 tablet all other days. Goal INR is 1.5 to 2.0.        INR was 1.7 today

## 2014-07-25 ENCOUNTER — Other Ambulatory Visit: Payer: Self-pay | Admitting: Family Medicine

## 2014-07-27 DIAGNOSIS — M1710 Unilateral primary osteoarthritis, unspecified knee: Secondary | ICD-10-CM | POA: Diagnosis not present

## 2014-07-28 ENCOUNTER — Other Ambulatory Visit: Payer: Self-pay | Admitting: Family Medicine

## 2014-07-29 ENCOUNTER — Other Ambulatory Visit: Payer: Self-pay | Admitting: Family Medicine

## 2014-07-30 ENCOUNTER — Other Ambulatory Visit: Payer: Self-pay | Admitting: Family Medicine

## 2014-08-11 ENCOUNTER — Ambulatory Visit (INDEPENDENT_AMBULATORY_CARE_PROVIDER_SITE_OTHER): Payer: Medicare Other | Admitting: Pharmacist

## 2014-08-11 DIAGNOSIS — I4891 Unspecified atrial fibrillation: Secondary | ICD-10-CM

## 2014-08-11 LAB — POCT INR: INR: 1.7

## 2014-08-11 NOTE — Patient Instructions (Signed)
Anticoagulation Dose Instructions as of 08/11/2014     Dalton Vaughan Tue Wed Thu Fri Sat   New Dose 2.5 mg 5 mg 2.5 mg 5 mg 2.5 mg 5 mg 2.5 mg    Description       Continue same warfarin dose of 1 tablet on mondays, wednesdays and fridays and 1/2 tablet all other days. Goal INR is 1.5 to 2.0.        INR was 1.7 today

## 2014-08-31 ENCOUNTER — Encounter: Payer: Self-pay | Admitting: Family Medicine

## 2014-08-31 ENCOUNTER — Ambulatory Visit (INDEPENDENT_AMBULATORY_CARE_PROVIDER_SITE_OTHER): Payer: Medicare Other | Admitting: Family Medicine

## 2014-08-31 ENCOUNTER — Telehealth: Payer: Self-pay

## 2014-08-31 ENCOUNTER — Ambulatory Visit (INDEPENDENT_AMBULATORY_CARE_PROVIDER_SITE_OTHER): Payer: Medicare Other

## 2014-08-31 VITALS — BP 156/89 | HR 89 | Temp 96.9°F | Ht 71.0 in | Wt 180.0 lb

## 2014-08-31 DIAGNOSIS — T148 Other injury of unspecified body region: Secondary | ICD-10-CM

## 2014-08-31 DIAGNOSIS — N400 Enlarged prostate without lower urinary tract symptoms: Secondary | ICD-10-CM | POA: Diagnosis not present

## 2014-08-31 DIAGNOSIS — E039 Hypothyroidism, unspecified: Secondary | ICD-10-CM

## 2014-08-31 DIAGNOSIS — E785 Hyperlipidemia, unspecified: Secondary | ICD-10-CM

## 2014-08-31 DIAGNOSIS — I119 Hypertensive heart disease without heart failure: Secondary | ICD-10-CM | POA: Diagnosis not present

## 2014-08-31 DIAGNOSIS — S30860A Insect bite (nonvenomous) of lower back and pelvis, initial encounter: Secondary | ICD-10-CM

## 2014-08-31 DIAGNOSIS — W57XXXA Bitten or stung by nonvenomous insect and other nonvenomous arthropods, initial encounter: Secondary | ICD-10-CM

## 2014-08-31 DIAGNOSIS — S30861A Insect bite (nonvenomous) of abdominal wall, initial encounter: Secondary | ICD-10-CM

## 2014-08-31 DIAGNOSIS — I2510 Atherosclerotic heart disease of native coronary artery without angina pectoris: Secondary | ICD-10-CM | POA: Diagnosis not present

## 2014-08-31 DIAGNOSIS — K219 Gastro-esophageal reflux disease without esophagitis: Secondary | ICD-10-CM | POA: Diagnosis not present

## 2014-08-31 DIAGNOSIS — I4891 Unspecified atrial fibrillation: Secondary | ICD-10-CM | POA: Diagnosis not present

## 2014-08-31 DIAGNOSIS — K6289 Other specified diseases of anus and rectum: Secondary | ICD-10-CM

## 2014-08-31 DIAGNOSIS — E559 Vitamin D deficiency, unspecified: Secondary | ICD-10-CM

## 2014-08-31 MED ORDER — HYDROCORTISONE ACE-PRAMOXINE 2.5-1 % RE CREA: 1.0000 "application " | TOPICAL_CREAM | Freq: Three times a day (TID) | RECTAL | Status: DC

## 2014-08-31 NOTE — Patient Instructions (Addendum)
Medicare Annual Wellness Visit  [place] and the medical providers at [facility] strive to bring you the best medical care.  In doing so we not only want to address your current medical conditions and concerns but also to detect new conditions early and prevent illness, disease and health-related problems.    Medicare offers a yearly Wellness Visit which allows our clinical staff to assess your need for preventative services including immunizations, lifestyle education, counseling to decrease risk of preventable diseases and screening for fall risk and other medical concerns.    This visit is provided free of charge (no copay) for all Medicare recipients. The clinical pharmacists at [facility] have begun to conduct these Wellness Visits which will also include a thorough review of all your medications.    As you primary medical provider recommend that you make an appointment for your Annual Wellness Visit if you have not done so already this year.  You may set up this appointment before you leave today or you may call back [phone]) and schedule an appointment.  Please make sure when you call that you mention that you are scheduling your Annual Wellness Visit with the clinical pharmacist so that the appointment may be made for the proper length of time.     Continue current medications. Continue good therapeutic lifestyle changes which include good diet and exercise. Fall precautions discussed with patient. If an FOBT was given today- please return it to our front desk. If you are over 41 years old - you may need Prevnar 2 or the adult Pneumonia vaccine. s Flu Shots will be available at our office starting mid- September. Please call and schedule a FLU CLINIC APPOINTMENT.   If rectal pain recurs, call us, and we will make a referral to our GI for a proctoscopic exam. Please protect your skin better before going in the yard  or the woods from the insect bites Cortisone 10 cream may be helpful with some of the itching especially on your ankles Return to the lab for fasting blood work

## 2014-08-31 NOTE — Telephone Encounter (Signed)
Pt aware of CXR results.

## 2014-08-31 NOTE — Telephone Encounter (Signed)
Message copied by Koren Bound on Tue Aug 31, 2014  2:02 PM ------      Message from: Chipper Herb      Created: Tue Aug 31, 2014  1:49 PM       As per radiology report ------

## 2014-08-31 NOTE — Progress Notes (Signed)
Subjective:    Patient ID: Dalton Vaughan, male    DOB: [birth year], 80 y.o.   MRN: [id number]  HPI Pt here for follow up and management of chronic medical problems. The patient indicates his blood pressures at home are much better than they are here in the office. His blood pressure at home yesterday was 105/68. He is due to return and FOBT and get a chest x-Maurie. He will return to office for fasting lab work. He is still having rectal pain. The pain is improved. The last episode of this was worsened by a rectal exam by the urologist. He started using hydrocortisone at [facility] and the rectal pain has gotten better. This sounds like it could be a fissure. Please see outside blood pressure readings that were brought in by the patient for review. These blood pressure readings were excellent.       Patient Active Problem List   Diagnosis Date Noted  . Allergic rhinitis 05/06/2014  . Depression 08/03/2013  . Sick sinus syndrome 07/31/2012  . hypertension   . Atrial fibrillation   . CAD (coronary artery disease)   . History of TIAs   . Lumbar disc disease   . GERD (gastroesophageal reflux disease)   . Long-term (current) use of anticoagulants   . BPH (benign prostatic hypertrophy)   . Hyperlipidemia    Outpatient Encounter Prescriptions as of 08/31/2014  Medication Sig  . ALTACE 10 MG capsule TAKE 1 CAPSULE DAILY  . amLODipine (NORVASC) 5 MG tablet TAKE ONE-HALF (1/2) TABLET TWICE A DAY  . azelastine (ASTELIN) 137 MCG/SPRAY nasal spray USE 1 TO 2 SPRAYS IN EACH NOSTRIL TWICE A DAY  . Calcium Carbonate-Vitamin D (CALCIUM 600 + D PO) Take 1 tablet by mouth daily.   . Cholecalciferol (VITAMIN D-3) 5000 UNITS TABS Take 1 tablet by mouth daily.   . Coenzyme Q10 (CO Q 10) 100 MG CAPS Take 1 capsule by mouth daily.  Marland Kitchen doxazosin (CARDURA) 8 MG tablet TAKE 1 TABLET AT BEDTIME  . fluticasone (FLONASE) 50 MCG/ACT nasal spray USE 1 TO 2 SPRAYS IN EACH NOSTRIL DAILY  . folic acid (FOLVITE) 1 MG tablet  TAKE 1 TABLET DAILY  . levothyroxine (SYNTHROID, LEVOTHROID) 25 MCG tablet TAKE 1 TABLET DAILY  . mirtazapine (REMERON) 15 MG tablet TAKE 1 TABLET DAILY  . Multiple Vitamin (MULTIVITAMIN) tablet Take 1 tablet by mouth daily.    Marland Kitchen NEXIUM 40 MG capsule TAKE 1 CAPSULE DAILY  . rosuvastatin (CRESTOR) 10 MG tablet Take 1 tablet (10 mg total) by mouth at bedtime.  . triamterene-hydrochlorothiazide (MAXZIDE-25) 37.5-25 MG per tablet TAKE 1 TABLET DAILY  . vitamin C (ASCORBIC ACID) 500 MG tablet Take 500 mg by mouth daily. Take 1/4 tablet by mouth daily.  Marland Kitchen warfarin (COUMADIN) 5 MG tablet TAKE AS DIRECTED  . VIAGRA 100 MG tablet TAKE 1 TABLET EVERY DAY AS DIRECTED    Review of Systems  Constitutional: Negative.   HENT: Negative.   Eyes: Negative.   Respiratory: Negative.   Cardiovascular: Negative.   Gastrointestinal: Positive for rectal pain. Negative for anal bleeding.  Endocrine: Negative.   Genitourinary: Negative.   Musculoskeletal: Negative.   Skin: Negative.   Allergic/Immunologic: Negative.   Neurological: Negative.   Hematological: Negative.   Psychiatric/Behavioral: Negative.        Objective:   Physical Exam  Nursing note and vitals reviewed. Constitutional: He is oriented to person, place, and time. He appears well-developed and well-nourished. No distress.  The patient is  alert and cooperative  HENT:  Head: Normocephalic and atraumatic.  Right Ear: External ear normal.  Left Ear: External ear normal.  Nose: Nose normal.  Mouth/Throat: Oropharynx is clear and moist. No oropharyngeal exudate.  There is some nasal congestion right greater than left  Eyes: Conjunctivae and EOM are normal. Pupils are equal, round, and reactive to light. Right eye exhibits no discharge. Left eye exhibits no discharge. No scleral icterus.  Neck: Normal range of motion. Neck supple. No thyromegaly present.  No carotid bruits  Cardiovascular: Normal rate, regular rhythm, normal heart sounds  and intact distal pulses.  Exam reveals no gallop and no friction rub.   No murmur heard. The heart is irregular irregular at 72 per minute  Pulmonary/Chest: Effort normal and breath sounds normal. No respiratory distress. He has no wheezes. He has no rales. He exhibits no tenderness.  Abdominal: Soft. Bowel sounds are normal. He exhibits no mass. There is no tenderness. There is no rebound and no guarding.  Genitourinary:  The external rectum was looked at and there were no lumps or masses present. A rectal exam was not done  Musculoskeletal: Normal range of motion. He exhibits no edema and no tenderness.  Lymphadenopathy:    He has no cervical adenopathy.  Neurological: He is alert and oriented to person, place, and time. He has normal reflexes. No cranial nerve deficit.  Skin: Skin is warm and dry. No rash noted. No erythema. No pallor.  Multiple insect bites ankles legs abdomen  Psychiatric: He has a normal mood and affect. His behavior is normal. Judgment and thought content normal.   BP 156/89  Pulse 89  Temp(Src) 96.9 F (36.1 C) (Oral)  Ht 5' 11"  (1.803 m)  Wt 180 lb (81.647 kg)  BMI 25.12 kg/m2        Assessment & Plan:  1. Atrial fibrillation, unspecified - POCT CBC; Future  2. BPH (benign prostatic hypertrophy) - POCT CBC; Future  3. Gastroesophageal reflux disease, esophagitis presence not specified - POCT CBC; Future  4. Hyperlipidemia - POCT CBC; Future - NMR, lipoprofile; Future  5. hypertension - POCT CBC; Future - BMP8+EGFR; Future - Hepatic function panel; Future - DG Chest 2 View; Future  6. Unspecified hypothyroidism - Thyroid Panel With TSH; Future  7. Vitamin D deficiency - Vit D  25 hydroxy (rtn osteoporosis monitoring); Future  8. Insect bites  9. Tick bite of abdomen, initial encounter - [place] spotted fvr abs pnl(IgG+IgM); Future - Lyme Ab/Western Blot Reflex; Future  10. Rectal pain -A proctoscopic exam should be done with the  next bout of rectal pain  Meds ordered this encounter  Medications  . hydrocortisone-pramoxine (ANALPRAM-HC) 2.5-1 % rectal cream    Sig: Place 1 application rectally 3 (three) times daily.    Dispense:  30 g    Refill:  2   Patient Instructions                       Medicare Annual Wellness Visit  [facility] and the medical providers at [facility] strive to bring you the best medical care.  In doing so we not only want to address your current medical conditions and concerns but also to detect new conditions early and prevent illness, disease and health-related problems.    Medicare offers a yearly Wellness Visit which allows our clinical staff to assess your need for preventative services including immunizations, lifestyle education, counseling to decrease risk of preventable  diseases and screening for fall risk and other medical concerns.    This visit is provided free of charge (no copay) for all Medicare recipients. The clinical pharmacists at [facility] have begun to conduct these Wellness Visits which will also include a thorough review of all your medications.    As you primary medical provider recommend that you make an appointment for your Annual Wellness Visit if you have not done so already this year.  You may set up this appointment before you leave today or you may call back ([phone]) and schedule an appointment.  Please make sure when you call that you mention that you are scheduling your Annual Wellness Visit with the clinical pharmacist so that the appointment may be made for the proper length of time.     Continue current medications. Continue good therapeutic lifestyle changes which include good diet and exercise. Fall precautions discussed with patient. If an FOBT was given today- please return it to our front desk. If you are over 38 years old - you may need Prevnar 42 or the adult Pneumonia vaccine. s Flu Shots will be  available at our office starting mid- September. Please call and schedule a FLU CLINIC APPOINTMENT.   If rectal pain recurs, call us, and we will make a referral to our GI for a proctoscopic exam. Please protect your skin better before going in the yard or the woods from the insect bites Cortisone 10 cream may be helpful with some of the itching especially on your ankles Return to the lab for fasting blood work   Arrie Senate MD

## 2014-09-07 ENCOUNTER — Other Ambulatory Visit (INDEPENDENT_AMBULATORY_CARE_PROVIDER_SITE_OTHER): Payer: Medicare Other

## 2014-09-07 DIAGNOSIS — E785 Hyperlipidemia, unspecified: Secondary | ICD-10-CM | POA: Diagnosis not present

## 2014-09-07 DIAGNOSIS — K219 Gastro-esophageal reflux disease without esophagitis: Secondary | ICD-10-CM | POA: Diagnosis not present

## 2014-09-07 DIAGNOSIS — E039 Hypothyroidism, unspecified: Secondary | ICD-10-CM

## 2014-09-07 DIAGNOSIS — E559 Vitamin D deficiency, unspecified: Secondary | ICD-10-CM | POA: Diagnosis not present

## 2014-09-07 DIAGNOSIS — N400 Enlarged prostate without lower urinary tract symptoms: Secondary | ICD-10-CM | POA: Diagnosis not present

## 2014-09-07 DIAGNOSIS — I4891 Unspecified atrial fibrillation: Secondary | ICD-10-CM

## 2014-09-07 DIAGNOSIS — I119 Hypertensive heart disease without heart failure: Secondary | ICD-10-CM | POA: Diagnosis not present

## 2014-09-07 DIAGNOSIS — S30861A Insect bite (nonvenomous) of abdominal wall, initial encounter: Secondary | ICD-10-CM

## 2014-09-07 DIAGNOSIS — S30860A Insect bite (nonvenomous) of lower back and pelvis, initial encounter: Secondary | ICD-10-CM | POA: Diagnosis not present

## 2014-09-07 DIAGNOSIS — W57XXXA Bitten or stung by nonvenomous insect and other nonvenomous arthropods, initial encounter: Secondary | ICD-10-CM

## 2014-09-07 LAB — POCT CBC
Granulocyte percent: 60 %G (ref 37–80)
HCT, POC: 42.3 % — AB (ref 43.5–53.7)
Hemoglobin: 13.1 g/dL — AB (ref 14.1–18.1)
Lymph, poc: 2.4 (ref 0.6–3.4)
MCH, POC: 28.3 pg (ref 27–31.2)
MCHC: 30.9 g/dL — AB (ref 31.8–35.4)
MCV: 91.7 fL (ref 80–97)
MPV: 8.8 fL (ref 0–99.8)
POC Granulocyte: 3.9 (ref 2–6.9)
POC LYMPH PERCENT: 37.5 %L (ref 10–50)
Platelet Count, POC: 139 10*3/uL — AB (ref 142–424)
RBC: 4.6 M/uL — AB (ref 4.69–6.13)
RDW, POC: 16.2 %
WBC: 6.5 10*3/uL (ref 4.6–10.2)

## 2014-09-07 NOTE — Progress Notes (Signed)
Lab only 

## 2014-09-09 LAB — THYROID PANEL WITH TSH
Free Thyroxine Index: 2.1 (ref 1.2–4.9)
T3 Uptake Ratio: 31 % (ref 24–39)
T4, Total: 6.8 ug/dL (ref 4.5–12.0)
TSH: 4.26 u[IU]/mL (ref 0.450–4.500)

## 2014-09-09 LAB — BMP8+EGFR
BUN/Creatinine Ratio: 12 (ref 10–22)
BUN: 10 mg/dL (ref 8–27)
CO2: 28 mmol/L (ref 18–29)
Calcium: 9 mg/dL (ref 8.6–10.2)
Chloride: 103 mmol/L (ref 97–108)
Creatinine, Ser: 0.86 mg/dL (ref 0.76–1.27)
GFR calc Af Amer: 93 mL/min/{1.73_m2} (ref >59)
GFR calc non Af Amer: 80 mL/min/{1.73_m2} (ref >59)
Glucose: 85 mg/dL (ref 65–99)
Potassium: 4 mmol/L (ref 3.5–5.2)
Sodium: 145 mmol/L — ABNORMAL HIGH (ref 134–144)

## 2014-09-09 LAB — ROCKY MTN SPOTTED FVR ABS PNL(IGG+IGM)
RMSF IgG: UNDETERMINED
RMSF IgM: 0.35 index (ref 0.00–0.89)

## 2014-09-09 LAB — HEPATIC FUNCTION PANEL
ALT: 12 IU/L (ref 0–44)
AST: 16 IU/L (ref 0–40)
Albumin: 4 g/dL (ref 3.5–4.7)
Alkaline Phosphatase: 80 IU/L (ref 39–117)
Bilirubin, Direct: 0.13 mg/dL (ref 0.00–0.40)
Total Bilirubin: 0.4 mg/dL (ref 0.0–1.2)
Total Protein: 6.1 g/dL (ref 6.0–8.5)

## 2014-09-09 LAB — VITAMIN D 25 HYDROXY (VIT D DEFICIENCY, FRACTURES): Vit D, 25-Hydroxy: 41.6 ng/mL (ref 30.0–100.0)

## 2014-09-09 LAB — NMR, LIPOPROFILE
Cholesterol: 170 mg/dL (ref 100–199)
HDL Cholesterol by NMR: 47 mg/dL (ref >39)
HDL Particle Number: 28.2 umol/L — ABNORMAL LOW (ref >=30.5)
LDL Particle Number: 1124 nmol/L — ABNORMAL HIGH (ref <1000)
LDL Size: 21 nm (ref >20.5)
LDLC SERPL CALC-MCNC: 109 mg/dL — ABNORMAL HIGH (ref 0–99)
LP-IR Score: <25 (ref <=45)
Small LDL Particle Number: 207 nmol/L (ref <=527)
Triglycerides by NMR: 68 mg/dL (ref 0–149)

## 2014-09-09 LAB — RMSF, IGG, IFA: RMSF, IGG, IFA: 1:128 {titer} — ABNORMAL HIGH

## 2014-09-09 LAB — LYME AB/WESTERN BLOT REFLEX
LYME DISEASE AB, QUANT, IGM: <0.8 index (ref 0.00–0.79)
Lyme IgG/IgM Ab: <0.91 {ISR} (ref 0.00–0.90)

## 2014-09-13 ENCOUNTER — Other Ambulatory Visit: Payer: Self-pay | Admitting: Family Medicine

## 2014-09-13 MED ORDER — DOXYCYCLINE HYCLATE 100 MG PO TABS: 100.0000 mg | ORAL_TABLET | Freq: Two times a day (BID) | ORAL | Status: DC

## 2014-09-17 DIAGNOSIS — I119 Hypertensive heart disease without heart failure: Secondary | ICD-10-CM | POA: Diagnosis not present

## 2014-09-17 DIAGNOSIS — Z7901 Long term (current) use of anticoagulants: Secondary | ICD-10-CM | POA: Diagnosis not present

## 2014-09-17 DIAGNOSIS — E785 Hyperlipidemia, unspecified: Secondary | ICD-10-CM | POA: Diagnosis not present

## 2014-09-17 DIAGNOSIS — I482 Chronic atrial fibrillation: Secondary | ICD-10-CM | POA: Diagnosis not present

## 2014-09-17 DIAGNOSIS — I2510 Atherosclerotic heart disease of native coronary artery without angina pectoris: Secondary | ICD-10-CM | POA: Diagnosis not present

## 2014-09-17 DIAGNOSIS — I495 Sick sinus syndrome: Secondary | ICD-10-CM | POA: Diagnosis not present

## 2014-09-22 ENCOUNTER — Ambulatory Visit (INDEPENDENT_AMBULATORY_CARE_PROVIDER_SITE_OTHER): Payer: Medicare Other | Admitting: Pharmacist

## 2014-09-22 DIAGNOSIS — I4891 Unspecified atrial fibrillation: Secondary | ICD-10-CM

## 2014-09-22 DIAGNOSIS — Z23 Encounter for immunization: Secondary | ICD-10-CM

## 2014-09-22 LAB — POCT INR: INR: 1.7

## 2014-09-22 NOTE — Patient Instructions (Signed)
Anticoagulation Dose Instructions as of 09/22/2014     [name] Tue Wed Thu Fri Sat   New Dose 2.5 mg 5 mg 2.5 mg 5 mg 2.5 mg 5 mg 2.5 mg    Description       Continue same warfarin dose of 1 tablet on mondays, wednesdays and fridays and 1/2 tablet all other days. Goal INR is 1.5 to 2.0.        INR was 1.7 today (goal is 1.5 to 2.0)

## 2014-10-15 DIAGNOSIS — M1711 Unilateral primary osteoarthritis, right knee: Secondary | ICD-10-CM | POA: Diagnosis not present

## 2014-10-15 DIAGNOSIS — M1712 Unilateral primary osteoarthritis, left knee: Secondary | ICD-10-CM | POA: Diagnosis not present

## 2014-11-03 ENCOUNTER — Ambulatory Visit (INDEPENDENT_AMBULATORY_CARE_PROVIDER_SITE_OTHER): Payer: Medicare Other | Admitting: Pharmacist

## 2014-11-03 DIAGNOSIS — I4891 Unspecified atrial fibrillation: Secondary | ICD-10-CM

## 2014-11-03 LAB — POCT INR: INR: 1.7

## 2014-11-03 NOTE — Patient Instructions (Signed)
Anticoagulation Dose Instructions as of 11/03/2014      Dalton Vaughan Tue Wed Thu Fri Sat   New Dose 2.5 mg 5 mg 2.5 mg 5 mg 2.5 mg 5 mg 2.5 mg    Description        Continue same warfarin dose of 1 tablet on mondays, wednesdays and fridays and 1/2 tablet all other days.  Goal INR is 1.5 to 2.0.        INR was 1.7 today

## 2014-12-14 ENCOUNTER — Ambulatory Visit (INDEPENDENT_AMBULATORY_CARE_PROVIDER_SITE_OTHER): Payer: Medicare Other | Admitting: Pharmacist Clinician (PhC)/ Clinical Pharmacy Specialist

## 2014-12-14 DIAGNOSIS — I4891 Unspecified atrial fibrillation: Secondary | ICD-10-CM | POA: Diagnosis not present

## 2014-12-14 LAB — POCT INR: INR: 1.6

## 2015-01-06 ENCOUNTER — Ambulatory Visit: Payer: Medicare Other | Admitting: Family Medicine

## 2015-01-10 ENCOUNTER — Ambulatory Visit (INDEPENDENT_AMBULATORY_CARE_PROVIDER_SITE_OTHER): Payer: Medicare Other | Admitting: Family Medicine

## 2015-01-10 ENCOUNTER — Encounter: Payer: Self-pay | Admitting: Family Medicine

## 2015-01-10 VITALS — BP 144/86 | HR 54 | Temp 97.5°F | Ht 71.0 in | Wt 184.0 lb

## 2015-01-10 DIAGNOSIS — R05 Cough: Secondary | ICD-10-CM | POA: Diagnosis not present

## 2015-01-10 DIAGNOSIS — E785 Hyperlipidemia, unspecified: Secondary | ICD-10-CM | POA: Diagnosis not present

## 2015-01-10 DIAGNOSIS — E039 Hypothyroidism, unspecified: Secondary | ICD-10-CM | POA: Diagnosis not present

## 2015-01-10 DIAGNOSIS — K219 Gastro-esophageal reflux disease without esophagitis: Secondary | ICD-10-CM

## 2015-01-10 DIAGNOSIS — E559 Vitamin D deficiency, unspecified: Secondary | ICD-10-CM | POA: Diagnosis not present

## 2015-01-10 DIAGNOSIS — I119 Hypertensive heart disease without heart failure: Secondary | ICD-10-CM | POA: Diagnosis not present

## 2015-01-10 DIAGNOSIS — K6289 Other specified diseases of anus and rectum: Secondary | ICD-10-CM | POA: Diagnosis not present

## 2015-01-10 DIAGNOSIS — N400 Enlarged prostate without lower urinary tract symptoms: Secondary | ICD-10-CM

## 2015-01-10 DIAGNOSIS — R0981 Nasal congestion: Secondary | ICD-10-CM

## 2015-01-10 DIAGNOSIS — R059 Cough, unspecified: Secondary | ICD-10-CM

## 2015-01-10 MED ORDER — TRIAMTERENE-HCTZ 37.5-25 MG PO TABS: 1.0000 | ORAL_TABLET | Freq: Every day | ORAL | Status: DC

## 2015-01-10 MED ORDER — DOXAZOSIN MESYLATE 8 MG PO TABS
8.0000 mg | ORAL_TABLET | Freq: Every day | ORAL | Status: DC
Start: 2015-01-10 — End: 2015-09-01

## 2015-01-10 MED ORDER — HYDROCORTISONE ACE-PRAMOXINE 2.5-1 % RE CREA: 1.0000 "application " | TOPICAL_CREAM | Freq: Three times a day (TID) | RECTAL | Status: DC

## 2015-01-10 NOTE — Addendum Note (Signed)
Addended by: Zannie Cove on: 01/10/2015 12:41 PM   Modules accepted: Orders

## 2015-01-10 NOTE — Patient Instructions (Addendum)
Medicare Annual Wellness Visit  [facility] and the medical providers at [facility] strive to bring you the best medical care.  In doing so we not only want to address your current medical conditions and concerns but also to detect new conditions early and prevent illness, disease and health-related problems.    Medicare offers a yearly Wellness Visit which allows our clinical staff to assess your need for preventative services including immunizations, lifestyle education, counseling to decrease risk of preventable diseases and screening for fall risk and other medical concerns.    This visit is provided free of charge (no copay) for all Medicare recipients. The clinical pharmacists at [facility] have begun to conduct these Wellness Visits which will also include a thorough review of all your medications.    As you primary medical provider recommend that you make an appointment for your Annual Wellness Visit if you have not done so already this year.  You may set up this appointment before you leave today or you may call back ([phone]) and schedule an appointment.  Please make sure when you call that you mention that you are scheduling your Annual Wellness Visit with the clinical pharmacist so that the appointment may be made for the proper length of time.     Continue current medications. Continue good therapeutic lifestyle changes which include good diet and exercise. Fall precautions discussed with patient. If an FOBT was given today- please return it to our front desk. If you are over 62 years old - you may need Prevnar 19 or the adult Pneumonia vaccine.  Flu Shots will be available at our office starting mid- September. Please call and schedule a FLU CLINIC APPOINTMENT.   Drink plenty of fluids Keep the house as cool as possible Use a cool mist humidifier in your bedroom at nighttime Use saline nose spray and saline gel  to help nasal congestion Take Mucinex maximum strength plain, 1 twice daily with a large glass of water for cough and congestion If the congestion remains yellow in color or you start running any fever please call us back and we will call in an antibiotic for use. We will arrange for you to see the gastroenterologist because of this persistent peri-rectal pain.

## 2015-01-10 NOTE — Progress Notes (Addendum)
Subjective:    Patient ID: Dalton Vaughan, male    DOB: [birth year], 81 y.o.   MRN: [id number]  HPI Pt here for follow up and management of chronic medical problems which include hyperlipidemia, atrial fibrillation,CAD and hypothyroid. He is taking medications regularly. The patient today complains of congestion and cough and rectal pain. We will review his rectal pain issues. He has been to see a gastroenterologist several years ago and ultimately an orthopedic surgeon and have physical therapy and injections with no relief for his discomfort. He did indicate that this happened many years ago and 1977 and lasted for 5 years at that time. He describes no bleeding. He has been using some cream which may or may not help the discomfort. He ultimately has really found no relief for the rectal pain and discomfort which is quite annoying. The upper respiratory congestion has been bothering him for a good while there may be a slight yellow congestion but otherwise is just mostly clearing the chest or in the morning after sleeping.         Patient Active Problem List   Diagnosis Date Noted  . Allergic rhinitis 05/06/2014  . Depression 08/03/2013  . Sick sinus syndrome 07/31/2012  . hypertension   . Atrial fibrillation   . CAD (coronary artery disease)   . History of TIAs   . Lumbar disc disease   . GERD (gastroesophageal reflux disease)   . Long-term (current) use of anticoagulants   . BPH (benign prostatic hypertrophy)   . Hyperlipidemia    Outpatient Encounter Prescriptions as of 01/10/2015  Medication Sig  . ALTACE 10 MG capsule TAKE 1 CAPSULE DAILY  . amLODipine (NORVASC) 5 MG tablet TAKE ONE-HALF (1/2) TABLET TWICE A DAY  . azelastine (ASTELIN) 137 MCG/SPRAY nasal spray USE 1 TO 2 SPRAYS IN EACH NOSTRIL TWICE A DAY  . Calcium Carbonate-Vitamin D (CALCIUM 600 + D PO) Take 1 tablet by mouth daily.   . Cholecalciferol (VITAMIN D-3) 5000 UNITS TABS Take 1 tablet by mouth daily.   . Coenzyme  Q10 (CO Q 10) 100 MG CAPS Take 1 capsule by mouth daily.  Marland Kitchen doxazosin (CARDURA) 8 MG tablet TAKE 1 TABLET AT BEDTIME  . fluticasone (FLONASE) 50 MCG/ACT nasal spray USE 1 TO 2 SPRAYS IN EACH NOSTRIL DAILY  . folic acid (FOLVITE) 1 MG tablet TAKE 1 TABLET DAILY  . hydrocortisone-pramoxine (ANALPRAM-HC) 2.5-1 % rectal cream Place 1 application rectally 3 (three) times daily.  Marland Kitchen levothyroxine (SYNTHROID, LEVOTHROID) 25 MCG tablet TAKE 1 TABLET DAILY  . mirtazapine (REMERON) 15 MG tablet TAKE 1 TABLET DAILY  . Multiple Vitamin (MULTIVITAMIN) tablet Take 1 tablet by mouth daily.    Marland Kitchen NEXIUM 40 MG capsule TAKE 1 CAPSULE DAILY  . rosuvastatin (CRESTOR) 10 MG tablet Take 1 tablet (10 mg total) by mouth at bedtime.  . triamterene-hydrochlorothiazide (MAXZIDE-25) 37.5-25 MG per tablet TAKE 1 TABLET DAILY  . vitamin C (ASCORBIC ACID) 500 MG tablet Take 500 mg by mouth daily. Take 1/4 tablet by mouth daily.  Marland Kitchen warfarin (COUMADIN) 5 MG tablet TAKE AS DIRECTED  . [DISCONTINUED] VIAGRA 100 MG tablet TAKE 1 TABLET EVERY DAY AS DIRECTED  . [DISCONTINUED] doxycycline (VIBRA-TABS) 100 MG tablet Take 1 tablet (100 mg total) by mouth 2 (two) times daily.    Review of Systems  Constitutional: Negative.   HENT: Positive for congestion.   Eyes: Negative.   Respiratory: Positive for cough.   Cardiovascular: Negative.   Gastrointestinal: Positive for rectal  pain.  Endocrine: Negative.   Genitourinary: Negative.   Musculoskeletal: Negative.   Skin: Negative.   Allergic/Immunologic: Negative.   Neurological: Negative.   Hematological: Negative.   Psychiatric/Behavioral: Negative.        Objective:   Physical Exam  Constitutional: He is oriented to person, place, and time. He appears well-developed and well-nourished. No distress.  HENT:  Head: Normocephalic and atraumatic.  Right Ear: External ear normal.  Left Ear: External ear normal.  Mouth/Throat: Oropharynx is clear and moist. No oropharyngeal  exudate.  There is some nasal congestion bilaterally.  Eyes: Conjunctivae and EOM are normal. Pupils are equal, round, and reactive to light. Right eye exhibits no discharge. Left eye exhibits no discharge. No scleral icterus.  Neck: Normal range of motion. Neck supple. No thyromegaly present.  There is no anterior cervical adenopathy or carotid bruits  Cardiovascular: Normal rate, normal heart sounds and intact distal pulses.   No murmur heard. The heart is irregular irregular at 72/m  Pulmonary/Chest: Effort normal and breath sounds normal. No respiratory distress. He has no wheezes. He has no rales. He exhibits no tenderness.  The patient basically has clear lung fields with a dry cough.  Abdominal: Soft. Bowel sounds are normal. He exhibits no mass. There is no tenderness. There is no rebound and no guarding.  Genitourinary: Rectum normal and penis normal.  The prostate is enlarged. There is definite left perianal tenderness and rectal tenderness on the left lower side. This exam Manning's and makes his discomfort worse. There is no obvious blood or bleeding on the glove.  Musculoskeletal: Normal range of motion. He exhibits no edema.  Lymphadenopathy:    He has no cervical adenopathy.  Neurological: He is alert and oriented to person, place, and time. He has normal reflexes. No cranial nerve deficit.  Skin: Skin is warm and dry. No rash noted. No erythema. No pallor.  The skin on both feet was very dry.  Psychiatric: He has a normal mood and affect. His behavior is normal. Judgment and thought content normal.  Nursing note and vitals reviewed.  BP 144/86 mmHg  Pulse 54  Temp(Src) 97.5 F (36.4 C) (Oral)  Ht 5' 11" (1.803 m)  Wt 184 lb (83.462 kg)  BMI 25.67 kg/m2        Assessment & Plan:  1. BPH (benign prostatic hypertrophy) -Continue follow-up with urology - POCT CBC; Future  2. Gastroesophageal reflux disease, esophagitis presence not specified -At this time this is  under good control and the patient should continue with his Nexium. - POCT CBC; Future  3. Hyperlipidemia -The patient should continue with his Crestor and any changes in this medication will be secondary to the lab results. -- POCT CBC; Future - NMR, lipoprofile; Future  4. hypertension -The patient should continue with his Cardura altace and amlodipine -He should continue to watch his sodium intake - POCT CBC; Future - BMP8+EGFR; Future - Hepatic function panel; Future  5. Vitamin D deficiency -See future lab work for any changes in treatment of this problem - POCT CBC; Future - Vit D  25 hydroxy (rtn osteoporosis monitoring); Future  6. Hypothyroidism, unspecified hypothyroidism type -Continue current thyroid treatment until lab work is returned - POCT CBC; Future - Thyroid Panel With TSH; Future  7. Rectal pain -For now continue with the rectal cream as prescribed - Ambulatory referral to Gastroenterology  8. Cough -Drink plenty of fluids, continue Mucinex maximum strength and keep the house cooler as directed  9.  Congestion of nasal sinus -Saline nose spray and gel and increase fluid intake  Meds ordered this encounter  Medications  . hydrocortisone-pramoxine (ANALPRAM-HC) 2.5-1 % rectal cream    Sig: Place 1 application rectally 3 (three) times daily.    Dispense:  30 g    Refill:  3  . doxazosin (CARDURA) 8 MG tablet    Sig: Take 1 tablet (8 mg total) by mouth at bedtime.    Dispense:  90 tablet    Refill:  3  . triamterene-hydrochlorothiazide (MAXZIDE-25) 37.5-25 MG per tablet    Sig: Take 1 tablet by mouth daily.    Dispense:  90 tablet    Refill:  3   Patient Instructions                       Medicare Annual Wellness Visit  [facility] and the medical providers at [facility] strive to bring you the best medical care.  In doing so we not only want to address your current medical conditions and concerns but also to detect new  conditions early and prevent illness, disease and health-related problems.    Medicare offers a yearly Wellness Visit which allows our clinical staff to assess your need for preventative services including immunizations, lifestyle education, counseling to decrease risk of preventable diseases and screening for fall risk and other medical concerns.    This visit is provided free of charge (no copay) for all Medicare recipients. The clinical pharmacists at [facility] have begun to conduct these Wellness Visits which will also include a thorough review of all your medications.    As you primary medical provider recommend that you make an appointment for your Annual Wellness Visit if you have not done so already this year.  You may set up this appointment before you leave today or you may call back ([phone]) and schedule an appointment.  Please make sure when you call that you mention that you are scheduling your Annual Wellness Visit with the clinical pharmacist so that the appointment may be made for the proper length of time.     Continue current medications. Continue good therapeutic lifestyle changes which include good diet and exercise. Fall precautions discussed with patient. If an FOBT was given today- please return it to our front desk. If you are over 45 years old - you may need Prevnar 20 or the adult Pneumonia vaccine.  Flu Shots will be available at our office starting mid- September. Please call and schedule a FLU CLINIC APPOINTMENT.   Drink plenty of fluids Keep the house as cool as possible Use a cool mist humidifier in your bedroom at nighttime Use saline nose spray and saline gel to help nasal congestion Take Mucinex maximum strength plain, 1 twice daily with a large glass of water for cough and congestion If the congestion remains yellow in color or you start running any fever please call us back and we will call in an antibiotic for use. We will  arrange for you to see the gastroenterologist because of this persistent peri-rectal pain.   Arrie Senate MD

## 2015-01-10 NOTE — Addendum Note (Signed)
Addended by: Chipper Herb on: 01/10/2015 12:57 PM   Modules accepted: Level of Service

## 2015-01-12 ENCOUNTER — Encounter: Payer: Self-pay | Admitting: Internal Medicine

## 2015-01-21 ENCOUNTER — Other Ambulatory Visit: Payer: Medicare Other

## 2015-01-21 DIAGNOSIS — I119 Hypertensive heart disease without heart failure: Secondary | ICD-10-CM | POA: Diagnosis not present

## 2015-01-21 DIAGNOSIS — E039 Hypothyroidism, unspecified: Secondary | ICD-10-CM | POA: Diagnosis not present

## 2015-01-21 DIAGNOSIS — K219 Gastro-esophageal reflux disease without esophagitis: Secondary | ICD-10-CM

## 2015-01-21 DIAGNOSIS — E785 Hyperlipidemia, unspecified: Secondary | ICD-10-CM | POA: Diagnosis not present

## 2015-01-21 DIAGNOSIS — N400 Enlarged prostate without lower urinary tract symptoms: Secondary | ICD-10-CM

## 2015-01-21 DIAGNOSIS — E559 Vitamin D deficiency, unspecified: Secondary | ICD-10-CM | POA: Diagnosis not present

## 2015-01-21 LAB — POCT CBC
Granulocyte percent: 59.7 %G (ref 37–80)
HCT, POC: 45.5 % (ref 43.5–53.7)
Hemoglobin: 13.8 g/dL — AB (ref 14.1–18.1)
Lymph, poc: 2.3 (ref 0.6–3.4)
MCH, POC: 27.2 pg (ref 27–31.2)
MCHC: 30.3 g/dL — AB (ref 31.8–35.4)
MCV: 89.9 fL (ref 80–97)
MPV: 9.4 fL (ref 0–99.8)
POC Granulocyte: 3.5 (ref 2–6.9)
POC LYMPH PERCENT: 38.2 %L (ref 10–50)
Platelet Count, POC: 153 10*3/uL (ref 142–424)
RBC: 5.1 M/uL (ref 4.69–6.13)
RDW, POC: 15.2 %
WBC: 5.9 10*3/uL (ref 4.6–10.2)

## 2015-01-21 NOTE — Progress Notes (Signed)
Lab only 

## 2015-01-22 LAB — HEPATIC FUNCTION PANEL
ALT: 10 IU/L (ref 0–44)
AST: 17 IU/L (ref 0–40)
Albumin: 4.4 g/dL (ref 3.5–4.7)
Alkaline Phosphatase: 84 IU/L (ref 39–117)
Bilirubin, Direct: 0.17 mg/dL (ref 0.00–0.40)
Total Bilirubin: 0.6 mg/dL (ref 0.0–1.2)
Total Protein: 6.8 g/dL (ref 6.0–8.5)

## 2015-01-22 LAB — VITAMIN D 25 HYDROXY (VIT D DEFICIENCY, FRACTURES): Vit D, 25-Hydroxy: 37.1 ng/mL (ref 30.0–100.0)

## 2015-01-22 LAB — THYROID PANEL WITH TSH
Free Thyroxine Index: 2 (ref 1.2–4.9)
T3 Uptake Ratio: 29 % (ref 24–39)
T4, Total: 6.8 ug/dL (ref 4.5–12.0)
TSH: 5.41 u[IU]/mL — ABNORMAL HIGH (ref 0.450–4.500)

## 2015-01-22 LAB — BMP8+EGFR
BUN/Creatinine Ratio: 7 — ABNORMAL LOW (ref 10–22)
BUN: 5 mg/dL — ABNORMAL LOW (ref 8–27)
CO2: 31 mmol/L — ABNORMAL HIGH (ref 18–29)
Calcium: 9.4 mg/dL (ref 8.6–10.2)
Chloride: 99 mmol/L (ref 97–108)
Creatinine, Ser: 0.73 mg/dL — ABNORMAL LOW (ref 0.76–1.27)
GFR calc Af Amer: 99 mL/min/{1.73_m2} (ref >59)
GFR calc non Af Amer: 86 mL/min/{1.73_m2} (ref >59)
Glucose: 96 mg/dL (ref 65–99)
Potassium: 3.7 mmol/L (ref 3.5–5.2)
Sodium: 141 mmol/L (ref 134–144)

## 2015-01-22 LAB — NMR, LIPOPROFILE
Cholesterol: 190 mg/dL (ref 100–199)
HDL Cholesterol by NMR: 46 mg/dL (ref >39)
HDL Particle Number: 29.7 umol/L — ABNORMAL LOW (ref >=30.5)
LDL Particle Number: 1331 nmol/L — ABNORMAL HIGH (ref <1000)
LDL Size: 21.2 nm (ref >20.5)
LDL-C: 121 mg/dL — ABNORMAL HIGH (ref 0–99)
LP-IR Score: 25 (ref <=45)
Small LDL Particle Number: <90 nmol/L (ref <=527)
Triglycerides by NMR: 115 mg/dL (ref 0–149)

## 2015-01-24 ENCOUNTER — Telehealth: Payer: Self-pay | Admitting: *Deleted

## 2015-01-24 NOTE — Telephone Encounter (Signed)
-----   Message from Chipper Herb, MD sent at 01/23/2015 12:27 AM EST ----- The blood sugar is good at 96. The creatinine is slightly decreased and this is okay. The electrolytes including potassium were within normal limits except the CO2 was slightly elevated at 31. All liver function tests were within normal limits The total LDL particle number was elevated more than it was 4 months ago at 1331. The LDL C was elevated at 121. The triglycerides are good at 1:15. The good cholesterol or the HDL particle number was low and this is consistent with past readings.------ please confirm with the patient that he is taking his Crestor 10 mg regularly. Also please confirm with him that he is been following aggressive therapeutic lifestyle changes which include diet and exercise. The vitamin D level is 37.1 and this is to closer to the lower end of the normal range.------- please make sure that the patient is taking his vitamin D3, 5000 units, daily The TSH is slightly elevated. This means that he is not getting enough thyroid medication. He should increase his levothyroxine to 50 g daily. He can take 2 of the 25 until they are completed and then we should call in a new prescription for 50 and he can take one daily of the 50 g. He should have his thyroid repeated in 6 weeks. Getting the thyroid under better control may also help his cholesterol numbers.

## 2015-01-24 NOTE — Telephone Encounter (Signed)
LM to discuss labs

## 2015-01-25 ENCOUNTER — Ambulatory Visit (INDEPENDENT_AMBULATORY_CARE_PROVIDER_SITE_OTHER): Payer: Medicare Other | Admitting: Pharmacist Clinician (PhC)/ Clinical Pharmacy Specialist

## 2015-01-25 DIAGNOSIS — N400 Enlarged prostate without lower urinary tract symptoms: Secondary | ICD-10-CM

## 2015-01-25 DIAGNOSIS — I4891 Unspecified atrial fibrillation: Secondary | ICD-10-CM

## 2015-01-25 DIAGNOSIS — I119 Hypertensive heart disease without heart failure: Secondary | ICD-10-CM

## 2015-01-25 LAB — POCT INR: INR: 1.8

## 2015-01-26 ENCOUNTER — Encounter: Payer: Self-pay | Admitting: *Deleted

## 2015-01-26 ENCOUNTER — Other Ambulatory Visit: Payer: Self-pay | Admitting: *Deleted

## 2015-01-26 DIAGNOSIS — R7989 Other specified abnormal findings of blood chemistry: Secondary | ICD-10-CM

## 2015-01-26 MED ORDER — LEVOTHYROXINE SODIUM 50 MCG PO TABS: 50.0000 ug | ORAL_TABLET | Freq: Every day | ORAL | Status: DC

## 2015-02-01 ENCOUNTER — Other Ambulatory Visit: Payer: Medicare Other

## 2015-02-01 DIAGNOSIS — Z1212 Encounter for screening for malignant neoplasm of rectum: Secondary | ICD-10-CM | POA: Diagnosis not present

## 2015-02-01 NOTE — Progress Notes (Signed)
Lab only 

## 2015-02-03 LAB — FECAL OCCULT BLOOD, IMMUNOCHEMICAL: Fecal Occult Bld: POSITIVE — AB

## 2015-02-04 DIAGNOSIS — M62838 Other muscle spasm: Secondary | ICD-10-CM | POA: Diagnosis not present

## 2015-02-09 ENCOUNTER — Ambulatory Visit (INDEPENDENT_AMBULATORY_CARE_PROVIDER_SITE_OTHER): Payer: Medicare Other | Admitting: Nurse Practitioner

## 2015-02-09 ENCOUNTER — Encounter: Payer: Self-pay | Admitting: Nurse Practitioner

## 2015-02-09 VITALS — BP 146/89 | HR 81 | Temp 96.8°F | Ht 71.0 in | Wt 182.0 lb

## 2015-02-09 DIAGNOSIS — M542 Cervicalgia: Secondary | ICD-10-CM

## 2015-02-09 MED ORDER — CYCLOBENZAPRINE HCL 10 MG PO TABS
10.0000 mg | ORAL_TABLET | Freq: Three times a day (TID) | ORAL | Status: DC | PRN
Start: 2015-02-09 — End: 2015-05-04

## 2015-02-09 MED ORDER — MELOXICAM 15 MG PO TABS: 15.0000 mg | ORAL_TABLET | Freq: Every day | ORAL | Status: DC

## 2015-02-09 NOTE — Progress Notes (Signed)
  Subjective:     Dalton Vaughan is a 81 y.o. male who presents for evaluation of neck pain. Event that precipitated these symptoms: none known. Onset of symptoms was 10 days ago, and have been gradually improving since that time. Current symptoms are pain in neck (shooting and stabbing in character; 5/10 in severity) and paresthesias in left shoulder. Patient denies numbness in fingers, stiffness in neck and weakness in upper extremities. Patient has had recurrent self limited episodes of neck pain in the past. Previous treatments: neck support.  The following portions of the patient's history were reviewed and updated as appropriate: allergies, current medications, past family history, past medical history, past social history, past surgical history and problem list.  Review of Systems Pertinent items are noted in HPI.    Objective:    There were no vitals taken for this visit. General:   alert  External Deformity:  absent  ROM Cervical Spine:  normal range of motion and limited flexion, right rotation and left rotation  Midline Tenderness:  absent midline  Paraspinous tenderness:  absent midline  UE Neurologic Exam:  unremarkable, normal strength, normal sensation   X-Dewie of the cervical spine: Not indicated    Assessment:    Cervical pain    Plan:      1. Neck pain, acute Moist heat Rest Cervical collar if helps Meds ordered this encounter  Medications  . cyclobenzaprine (FLEXERIL) 10 MG tablet    Sig: Take 1 tablet (10 mg total) by mouth 3 (three) times daily as needed for muscle spasms.    Dispense:  30 tablet    Refill:  1    Order Specific Question:  Supervising Provider    Answer:  Chipper Herb [[id number]]  . meloxicam (MOBIC) 15 MG tablet    Sig: Take 1 tablet (15 mg total) by mouth daily.    Dispense:  30 tablet    Refill:  3    Order Specific Question:  Supervising Provider    Answer:  Chipper Herb [[id number]]   Mary-Margaret Hassell Done, FNP

## 2015-02-09 NOTE — Patient Instructions (Signed)
Cervical Sprain A cervical sprain is when the tissues (ligaments) that hold the neck bones in place stretch or tear. HOME CARE   Put ice on the injured area.  Put ice in a plastic bag.  Place a towel between your skin and the bag.  Leave the ice on for 15-20 minutes, 3-4 times a day.  You may have been given a collar to wear. This collar keeps your neck from moving while you heal.  Do not take the collar off unless told by your doctor.  If you have long hair, keep it outside of the collar.  Ask your doctor before changing the position of your collar. You may need to change its position over time to make it more comfortable.  If you are allowed to take off the collar for cleaning or bathing, follow your doctor's instructions on how to do it safely.  Keep your collar clean by wiping it with mild soap and water. Dry it completely. If the collar has removable pads, remove them every 1-2 days to hand wash them with soap and water. Allow them to air dry. They should be dry before you wear them in the collar.  Do not drive while wearing the collar.  Only take medicine as told by your doctor.  Keep all doctor visits as told.  Keep all physical therapy visits as told.  Adjust your work station so that you have good posture while you work.  Avoid positions and activities that make your problems worse.  Warm up and stretch before being active. GET HELP IF:  Your pain is not controlled with medicine.  You cannot take less pain medicine over time as planned.  Your activity level does not improve as expected. GET HELP RIGHT AWAY IF:   You are bleeding.  Your stomach is upset.  You have an allergic reaction to your medicine.  You develop new problems that you cannot explain.  You lose feeling (become numb) or you cannot move any part of your body (paralysis).  You have tingling or weakness in any part of your body.  Your symptoms get worse. Symptoms include:  Pain,  soreness, stiffness, puffiness (swelling), or a burning feeling in your neck.  Pain when your neck is touched.  Shoulder or upper back pain.  Limited ability to move your neck.  Headache.  Dizziness.  Your hands or arms feel week, lose feeling, or tingle.  Muscle spasms.  Difficulty swallowing or chewing. MAKE SURE YOU:   Understand these instructions.  Will watch your condition.  Will get help right away if you are not doing well or get worse. Document Released: 05/21/2008 Document Revised: 08/05/2013 Document Reviewed: 06/10/2013 [facility] Patient Information 2015 [place], [place]. This information is not intended to replace advice given to you by your health care provider. Make sure you discuss any questions you have with your health care provider.

## 2015-02-18 ENCOUNTER — Other Ambulatory Visit (INDEPENDENT_AMBULATORY_CARE_PROVIDER_SITE_OTHER): Payer: Medicare Other

## 2015-02-18 DIAGNOSIS — K921 Melena: Secondary | ICD-10-CM | POA: Diagnosis not present

## 2015-02-18 LAB — POCT CBC
Granulocyte percent: 60.6 %G (ref 37–80)
HCT, POC: 45.6 % (ref 43.5–53.7)
Hemoglobin: 13.8 g/dL — AB (ref 14.1–18.1)
Lymph, poc: 2.6 (ref 0.6–3.4)
MCH, POC: 27.4 pg (ref 27–31.2)
MCHC: 30.2 g/dL — AB (ref 31.8–35.4)
MCV: 90.7 fL (ref 80–97)
MPV: 7.5 fL (ref 0–99.8)
POC Granulocyte: 4.8 (ref 2–6.9)
POC LYMPH PERCENT: 32.5 %L (ref 10–50)
Platelet Count, POC: 258 10*3/uL (ref 142–424)
RBC: 5.03 M/uL (ref 4.69–6.13)
RDW, POC: 14.4 %
WBC: 8 10*3/uL (ref 4.6–10.2)

## 2015-02-18 NOTE — Progress Notes (Signed)
Lab only 

## 2015-02-21 ENCOUNTER — Other Ambulatory Visit (INDEPENDENT_AMBULATORY_CARE_PROVIDER_SITE_OTHER): Payer: Medicare Other

## 2015-02-21 DIAGNOSIS — Z1212 Encounter for screening for malignant neoplasm of rectum: Secondary | ICD-10-CM | POA: Diagnosis not present

## 2015-02-21 NOTE — Progress Notes (Signed)
Lab only 

## 2015-02-23 LAB — FECAL OCCULT BLOOD, IMMUNOCHEMICAL: Fecal Occult Bld: NEGATIVE

## 2015-02-24 ENCOUNTER — Encounter: Payer: Self-pay | Admitting: *Deleted

## 2015-03-03 ENCOUNTER — Telehealth: Payer: Self-pay | Admitting: Pharmacist

## 2015-03-03 NOTE — Telephone Encounter (Signed)
He can come in and we will check protime but it will have to be addressed before he leaves office.  I will work him in on 03/09/15.  Patient notified.

## 2015-03-08 ENCOUNTER — Telehealth: Payer: Self-pay | Admitting: *Deleted

## 2015-03-08 ENCOUNTER — Encounter: Payer: Self-pay | Admitting: Internal Medicine

## 2015-03-08 ENCOUNTER — Ambulatory Visit (INDEPENDENT_AMBULATORY_CARE_PROVIDER_SITE_OTHER): Payer: Medicare Other | Admitting: Internal Medicine

## 2015-03-08 VITALS — BP 162/100 | HR 68 | Ht 70.25 in | Wt 183.4 lb

## 2015-03-08 DIAGNOSIS — K602 Anal fissure, unspecified: Secondary | ICD-10-CM | POA: Diagnosis not present

## 2015-03-08 DIAGNOSIS — K6289 Other specified diseases of anus and rectum: Secondary | ICD-10-CM | POA: Diagnosis not present

## 2015-03-08 MED ORDER — AMBULATORY NON FORMULARY MEDICATION
Status: DC
Start: 2015-03-08 — End: 2015-05-04

## 2015-03-08 NOTE — Telephone Encounter (Signed)
Please address this with patient and work out the time he can be off before the procedure and let the patient know as well as the provider doing the procedure

## 2015-03-08 NOTE — Progress Notes (Signed)
Patient ID: Dalton Vaughan, male   DOB: September 04, 1929, 81 y.o.   MRN: [id number] HPI: Dalton Vaughan is an 81 yo male with PMH of diverticulosis, afib on warfarin, htn, hl, GERD, CAD, hypothyroidism who is seen in consultation at the request of Dr. Laurance Flatten to evaluate anorectal pain. He's here alone today. He states that he has had anorectal pain for the last 27 months. This is worse with sitting and he reports it "feels there" all the time. He's been using topical hydrocortisone cream applied to the anal canal without benefit. The pain fluctuates and certainly there are days when it is better than others. It hurts to pass stool particularly when stool is hard or he has to strain. He's tried to adjust his diet to avoid hard stool. He saw bleeding near the initial episode of pain occurring over 2 years ago but there's been no rectal bleeding, melena or hematochezia of late. He recalls having a similar problem in 1977 and recalls being diagnosed with a fissure. This reminds him of that pain. He denies abdominal pain. No nausea or vomiting. Weight is stable. No trouble swallowing. Good appetite. No early satiety or hepatobiliary complaint.  He has had prior evaluation with Dr. Amedeo Plenty last was 2007. Colonoscopy was performed which showed left-sided diverticulosis but the colonoscopy was incomplete. Subsequent barium enema showed left-sided diverticulosis and no large polyps or mass lesions.  He denies a family history of colorectal cancer and IBD.  Past Medical History  Diagnosis Date  . Hyperlipidemia   . Diverticulosis   . GERD (gastroesophageal reflux disease)   . Atrial fibrillation   . Vitreous detachment   . Internal hemorrhoids   . CAD (coronary artery disease)     Dr. Wynonia Lawman  . Hypertensive heart disease without CHF   . History of TIAs   . Lumbar disc disease   . BPH (benign prostatic hypertrophy)   . Oral bleeding 08/20/2012    Following molar tooth extraction July, 2013  . Vitamin D deficiency   .  Hypothyroidism   . Anal fissure   . Depression 2004  . Hypertension   . Thyroid disease   . Ruptured disk 1995    Past Surgical History  Procedure Laterality Date  . Bilateral cataract surgery  6/07  . Cysloscopy  8/08  . Prostate biopsy  8/08  . Coronary artery bypass graft  12/99     Dr. Servando Snare   . Tonsillectomy and adenoidectomy    . Hernia repair  1997    right    Outpatient Prescriptions Prior to Visit  Medication Sig Dispense Refill  . ALTACE 10 MG capsule TAKE 1 CAPSULE DAILY 90 capsule 3  . amLODipine (NORVASC) 5 MG tablet TAKE ONE-HALF (1/2) TABLET TWICE A DAY 90 tablet 0  . azelastine (ASTELIN) 137 MCG/SPRAY nasal spray USE 1 TO 2 SPRAYS IN EACH NOSTRIL TWICE A DAY 3 mL 4  . Calcium Carbonate-Vitamin D (CALCIUM 600 + D PO) Take 1 tablet by mouth daily.     . Cholecalciferol (VITAMIN D-3) 5000 UNITS TABS Take 1 tablet by mouth daily.     . Coenzyme Q10 (CO Q 10) 100 MG CAPS Take 1 capsule by mouth daily.    . cyclobenzaprine (FLEXERIL) 10 MG tablet Take 1 tablet (10 mg total) by mouth 3 (three) times daily as needed for muscle spasms. 30 tablet 1  . doxazosin (CARDURA) 8 MG tablet Take 1 tablet (8 mg total) by mouth at bedtime. (Patient taking differently: Take  8 mg by mouth at bedtime. Pt takes 6 mg) 90 tablet 3  . fluticasone (FLONASE) 50 MCG/ACT nasal spray USE 1 TO 2 SPRAYS IN EACH NOSTRIL DAILY 16 g 5  . folic acid (FOLVITE) 1 MG tablet TAKE 1 TABLET DAILY 90 tablet 0  . levothyroxine (SYNTHROID, LEVOTHROID) 50 MCG tablet Take 1 tablet (50 mcg total) by mouth daily. 30 tablet 3  . meloxicam (MOBIC) 15 MG tablet Take 1 tablet (15 mg total) by mouth daily. 30 tablet 3  . mirtazapine (REMERON) 15 MG tablet TAKE 1 TABLET DAILY (Patient taking differently: TAKE 1 TABLET DAILY; Pt takes 7.5 mg) 90 tablet 0  . Multiple Vitamin (MULTIVITAMIN) tablet Take 1 tablet by mouth daily.      Marland Kitchen NEXIUM 40 MG capsule TAKE 1 CAPSULE DAILY 90 capsule 1  . rosuvastatin (CRESTOR) 10 MG  tablet Take 1 tablet (10 mg total) by mouth at bedtime. (Patient taking differently: Take 10 mg by mouth at bedtime. Pt takes 5 mg) 90 tablet 1  . triamterene-hydrochlorothiazide (MAXZIDE-25) 37.5-25 MG per tablet Take 1 tablet by mouth daily. 90 tablet 3  . vitamin C (ASCORBIC ACID) 500 MG tablet Take 500 mg by mouth daily. Take 1/4 tablet by mouth daily.    Marland Kitchen warfarin (COUMADIN) 5 MG tablet TAKE AS DIRECTED (Patient taking differently: TAKE AS DIRECTED; Pt takes 2.5 mg on Tues, Thurs, Sat & Sun; Takes 5 mg on Mon, Wed, Fri) 90 tablet 1  . hydrocortisone-pramoxine (ANALPRAM-HC) 2.5-1 % rectal cream Place 1 application rectally 3 (three) times daily. 30 g 3   No facility-administered medications prior to visit.    Allergies  Allergen Reactions  . Paxil [Paroxetine Hcl] Palpitations  . Eliquis [Apixaban] Other (See Comments)    dizziness  . Penicillins     Said he is  Allergic" Natural combinations-extended release  . Pravachol [Pravastatin Sodium]   . Zetia [Ezetimibe]     myaalgias  . Vytorin [Ezetimibe-Simvastatin] Other (See Comments)    Family History  Problem Relation Age of Onset  . Heart disease Paternal Aunt   . Heart disease Paternal Uncle   . Colon cancer Neg Hx   . Colon polyps Neg Hx   . Kidney disease Neg Hx   . Esophageal cancer Neg Hx   . Gallbladder disease Neg Hx   . Diabetes Neg Hx     History  Substance Use Topics  . Smoking status: Former Smoker    Types: Cigarettes    Start date: 12/17/1948    Quit date: 12/17/1966  . Smokeless tobacco: Never Used  . Alcohol Use: 0.6 oz/week    1 Standard drinks or equivalent per week     Comment: Occassionally    ROS: As per history of present illness, otherwise negative  BP 162/100 mmHg  Pulse 68  Ht 5' 10.25" (1.784 m)  Wt 183 lb 6 oz (83.178 kg)  BMI 26.13 kg/m2 Constitutional: Well-developed and well-nourished. No distress. HEENT: Normocephalic and atraumatic. Conjunctivae are normal.  No scleral  icterus. Neck: Neck supple. Trachea midline. Cardiovascular: Irreg Pulmonary/chest: Effort normal and breath sounds normal. Abdominal: Soft, nontender, nondistended. Bowel sounds active throughout.  Rectal: external exam only, mild tenderness with pressure at anal canal, worse on the left.  No external hemorrhoids seen. No obvious fissure. Internal exam not performed. No perianal fluctuance Extremities: no clubbing, cyanosis, or edema Neurological: Alert and oriented to person place and time. Psychiatric: Normal mood and affect. Behavior is normal.  RELEVANT LABS AND  IMAGING: CBC    Component Value Date/Time   WBC 8.0 02/18/2015 1243   WBC 5.1 08/20/2012 1100   WBC 7.0 07/31/2012 1727   RBC 5.03 02/18/2015 1243   RBC 4.26 08/20/2012 1100   RBC 4.24 07/31/2012 1727   HGB 13.8* 02/18/2015 1243   HGB 12.1* 08/20/2012 1100   HGB 12.4* 07/31/2012 1727   HCT 45.6 02/18/2015 1243   HCT 37.7* 08/20/2012 1100   HCT 37.2* 07/31/2012 1727   PLT 160 08/20/2012 1100   PLT 151 07/31/2012 1727   MCV 90.7 02/18/2015 1243   MCV 88.5 08/20/2012 1100   MCV 87.7 07/31/2012 1727   MCH 27.4 02/18/2015 1243   MCH 28.4 08/20/2012 1100   MCH 29.2 07/31/2012 1727   MCHC 30.2* 02/18/2015 1243   MCHC 32.1 08/20/2012 1100   MCHC 33.3 07/31/2012 1727   RDW 15.3* 08/20/2012 1100   RDW 15.0 07/31/2012 1727   LYMPHSABS 2.1 08/20/2012 1100   LYMPHSABS 1.3 07/31/2012 1404   MONOABS 0.4 08/20/2012 1100   MONOABS 0.3 07/31/2012 1404   EOSABS 0.1 08/20/2012 1100   EOSABS 0.0 07/31/2012 1404   BASOSABS 0.0 08/20/2012 1100   BASOSABS 0.0 07/31/2012 1404    CMP     Component Value Date/Time   NA 141 01/21/2015 0815   NA 142 04/22/2013 1156   K 3.7 01/21/2015 0815   CL 99 01/21/2015 0815   CO2 31* 01/21/2015 0815   GLUCOSE 96 01/21/2015 0815   GLUCOSE 87 04/22/2013 1156   BUN 5* 01/21/2015 0815   BUN 10 04/22/2013 1156   CREATININE 0.73* 01/21/2015 0815   CREATININE 0.74 04/22/2013 1156    CALCIUM 9.4 01/21/2015 0815   PROT 6.8 01/21/2015 0815   PROT 6.6 04/22/2013 1156   ALBUMIN 4.4 04/22/2013 1156   AST 17 01/21/2015 0815   ALT 10 01/21/2015 0815   ALKPHOS 84 01/21/2015 0815   BILITOT 0.6 01/21/2015 0815   GFRNONAA 86 01/21/2015 0815   GFRNONAA 87 04/22/2013 1156   GFRAA 99 01/21/2015 0815   GFRAA >89 04/22/2013 1156   Colonoscopy and barium enema reviewed from 2007  CT pelvis Jan 2014 Clinical Data: Pelvic and perineal pain.   CT PELVIS WITHOUT CONTRAST   Technique:  Multidetector CT imaging of the pelvis was performed following the standard protocol without intravenous contrast.   Comparison: Pelvic radiographs dated 12/29/2012   Findings: The scan demonstrates slight rectal prolapse.   The osseous structures of the pelvis are normal including the ischial tuberosities.  Minimal degenerative changes of each hip. The sacrum and coccyx are normal.  There are no mass lesions. There is no bursitis or adenopathy.  The bladder wall is diffusely slightly thickened.  Prostate gland is minimally enlarged.   Benign calcification in the left seminal vesicle.   The muscle structures of the buttocks and thighs appear normal. Specifically, the origins of the hamstring muscles appear normal.   IMPRESSION: Rectal prolapse.  No other significant abnormality.     Original Report Authenticated By: Lorriane Shire, M.D.   ASSESSMENT/PLAN: 81 yo male with PMH of diverticulosis, afib on warfarin, htn, hl, GERD, CAD, hypothyroidism who is seen in consultation at the request of Dr. Laurance Flatten to evaluate anorectal pain.  1. Perianal pain -- symptoms are most consistent with anal fissure, which would now be chronic. We discussed the treatment of anal fissure which includes topical nitroglycerin 0.125% applied 3 times daily for 6-8 weeks. I've asked that he avoid prolonged periods on the toilet,  avoid straining, and use stool softeners as necessary to keep stool soft and avoid hard  stool. I recommended flexible sigmoidoscopy to completely evaluate the rectum and while he is sedated I can get a better exam of the anal canal. We discussed the risks and benefits of this procedure and he is agreeable to proceed. I advised that he discontinue use of topical hydrocortisone as this can lead to perianal atrophy and delay healing of fissure disease.  On review of the imaging, there was rectal prolapse which was not evident today. This could be another source for pain but will proceed to flexible sigmoidoscopy as above. Rectal prolapse treatment is in general surgical.    SO:[id number] Jennette Bill, [place], [place] [address]

## 2015-03-08 NOTE — Patient Instructions (Addendum)
You have been scheduled for a flexible sigmoidoscopy. Please follow the written instructions given to you at your visit today. If you use inhalers (even only as needed), please bring them with you on the day of your procedure.  Please discontinue your topical hydrocortisone cream.  We have sent a prescription for nitroglycerin 0.125% gel to [facility]. You should apply a pea size amount to your rectum three times daily x 6-8 weeks.  [facility]'s information is below: Address: [address] [place], [place] [address]  [phone]  You will be contaced by our office prior to your procedure for directions on holding your Coumadin/Warfarin.  If you do not hear from our office 1 week prior to your scheduled procedure, please call [phone] to discuss.

## 2015-03-08 NOTE — Telephone Encounter (Signed)
03/08/2015  RE: Dalton Vaughan DOB: 09-29-1929 MRN: [id number]  Dear Dr Laurance Flatten,   We have scheduled the above patient for a flexible sigmoidoscopy procedure. Our records show that he is on anticoagulation therapy.  Please advise as to whether the patient may come off his therapy of warfarin 5 days prior to the procedure, which is scheduled for 03/24/15. Please route your response to Dixon Boos, CMA  Sincerely,  Dixon Boos

## 2015-03-09 ENCOUNTER — Other Ambulatory Visit: Payer: Medicare Other

## 2015-03-09 ENCOUNTER — Ambulatory Visit (INDEPENDENT_AMBULATORY_CARE_PROVIDER_SITE_OTHER): Payer: Medicare Other | Admitting: Pharmacist

## 2015-03-09 DIAGNOSIS — R7989 Other specified abnormal findings of blood chemistry: Secondary | ICD-10-CM

## 2015-03-09 DIAGNOSIS — I482 Chronic atrial fibrillation: Secondary | ICD-10-CM | POA: Diagnosis not present

## 2015-03-09 DIAGNOSIS — G459 Transient cerebral ischemic attack, unspecified: Secondary | ICD-10-CM | POA: Diagnosis not present

## 2015-03-09 DIAGNOSIS — R946 Abnormal results of thyroid function studies: Secondary | ICD-10-CM | POA: Diagnosis not present

## 2015-03-09 DIAGNOSIS — I4821 Permanent atrial fibrillation: Secondary | ICD-10-CM

## 2015-03-09 DIAGNOSIS — I4891 Unspecified atrial fibrillation: Secondary | ICD-10-CM

## 2015-03-09 LAB — POCT INR: INR: 1.6

## 2015-03-09 NOTE — Progress Notes (Signed)
Lab only 

## 2015-03-09 NOTE — Patient Instructions (Signed)
Anticoagulation Dose Instructions as of 03/09/2015      [name] Tue Wed Thu Fri Sat   New Dose 2.5 mg 5 mg 2.5 mg 5 mg 2.5 mg 5 mg 2.5 mg    Description        Continue same warfarin dose of 1 tablet on mondays, wednesdays and fridays and 1/2 tablet all other days.  Goal INR is 1.5 to 2.0.

## 2015-03-10 ENCOUNTER — Telehealth: Payer: Self-pay | Admitting: *Deleted

## 2015-03-10 ENCOUNTER — Telehealth: Payer: Self-pay | Admitting: Family Medicine

## 2015-03-10 LAB — THYROID PANEL WITH TSH
Free Thyroxine Index: 2.7 (ref 1.2–4.9)
T3 Uptake Ratio: 34 % (ref 24–39)
T4, Total: 7.9 ug/dL (ref 4.5–12.0)
TSH: 2.02 u[IU]/mL (ref 0.450–4.500)

## 2015-03-10 MED ORDER — ENOXAPARIN SODIUM 80 MG/0.8ML ~~LOC~~ SOLN: 80.0000 mg | Freq: Two times a day (BID) | SUBCUTANEOUS | Status: DC

## 2015-03-10 NOTE — Telephone Encounter (Signed)
Thyroid levels are good. Continue current medications.

## 2015-03-10 NOTE — Telephone Encounter (Signed)
Patient states that Dr Tawanna Sat office has discussed Lovenox bridging with him for upcoming procedure.

## 2015-03-10 NOTE — Telephone Encounter (Signed)
Spoke with patient about bridging plan.  Copy provided to him.

## 2015-03-10 NOTE — Telephone Encounter (Signed)
-----   Message from Chipper Herb, MD sent at 03/10/2015  7:23 AM EDT ----- All thyroid function tests are within normal limits, the patient should continue with his current treatment of thyroid medication and we will recheck this again in the future

## 2015-03-10 NOTE — Telephone Encounter (Signed)
Lovenox bridging plan discussed with patient over phone - he is coming into office today to go over as well.

## 2015-03-10 NOTE — Telephone Encounter (Signed)
Recommend bridging plan per below.  Warfarin-Lovenox Bridging Plan for [employer] (DOB: Oct 03, 1929)  Friday, April 1st Last dose of warfarin   Saturday, April 2nd No Warfarin   Sunday, April 3rd No warfarin   Monday, April 4th No warfarin  Lovenox/enoxaparin 60m in morning and evening  Tuesday, April 5th No warfarin Lovenox/enoxaparin 846min morning and evening  Wednesday, April 6th No warfarin Lovenox/enoxaparin 8058mn morning only  Day of Procedure -  Thursday, April 7th No warfarin No Lovenox/enoxaparin  Friday, April 8th Warfarin 7.5mg42m 1 and  tablets) Lovenox 80mg97mthe evening only  Saturday, April 9th Warfarin 5mg (37m tablet) Lovenox 80mg i43me morning and evening  Sunday, April 10th Warfarin 5mg (= 63mablet) Lovenox 80mg in 2mmorning an evening  Monday, April 11th Come to office to have INR/ Protime checked   Dx. For anticoagulation therapy - A.Fib; TIA Patient weight =  83 kg Recommend enoxaparin 80mg twic67mday  Left message at patient's home to discuss plan.

## 2015-03-15 DIAGNOSIS — Z961 Presence of intraocular lens: Secondary | ICD-10-CM | POA: Diagnosis not present

## 2015-03-15 DIAGNOSIS — H04123 Dry eye syndrome of bilateral lacrimal glands: Secondary | ICD-10-CM | POA: Diagnosis not present

## 2015-03-15 DIAGNOSIS — H02834 Dermatochalasis of left upper eyelid: Secondary | ICD-10-CM | POA: Diagnosis not present

## 2015-03-15 DIAGNOSIS — H02831 Dermatochalasis of right upper eyelid: Secondary | ICD-10-CM | POA: Diagnosis not present

## 2015-03-24 ENCOUNTER — Ambulatory Visit (AMBULATORY_SURGERY_CENTER): Payer: Medicare Other | Admitting: Internal Medicine

## 2015-03-24 ENCOUNTER — Encounter: Payer: Self-pay | Admitting: Internal Medicine

## 2015-03-24 VITALS — BP 166/112 | HR 89 | Temp 96.7°F | Resp 71 | Ht 70.0 in | Wt 183.0 lb

## 2015-03-24 DIAGNOSIS — I10 Essential (primary) hypertension: Secondary | ICD-10-CM | POA: Diagnosis not present

## 2015-03-24 DIAGNOSIS — K602 Anal fissure, unspecified: Secondary | ICD-10-CM | POA: Diagnosis present

## 2015-03-24 DIAGNOSIS — K6289 Other specified diseases of anus and rectum: Secondary | ICD-10-CM

## 2015-03-24 DIAGNOSIS — M545 Low back pain: Secondary | ICD-10-CM | POA: Diagnosis not present

## 2015-03-24 DIAGNOSIS — I2510 Atherosclerotic heart disease of native coronary artery without angina pectoris: Secondary | ICD-10-CM | POA: Diagnosis not present

## 2015-03-24 DIAGNOSIS — Z8673 Personal history of transient ischemic attack (TIA), and cerebral infarction without residual deficits: Secondary | ICD-10-CM | POA: Diagnosis not present

## 2015-03-24 DIAGNOSIS — I4891 Unspecified atrial fibrillation: Secondary | ICD-10-CM | POA: Diagnosis not present

## 2015-03-24 MED ORDER — SODIUM CHLORIDE 0.9 % IV SOLN: 500.0000 mL | INTRAVENOUS | Status: DC

## 2015-03-24 NOTE — Patient Instructions (Addendum)
YOU HAD AN ENDOSCOPIC PROCEDURE TODAY AT [facility]:   Refer to the procedure report that was given to you for any specific questions about what was found during the examination.  If the procedure report does not answer your questions, please call your gastroenterologist to clarify.  If you requested that your care partner not be given the details of your procedure findings, then the procedure report has been included in a sealed envelope for you to review at your convenience later.  YOU SHOULD EXPECT: Some feelings of bloating in the abdomen. Passage of more gas than usual.  Walking can help get rid of the air that was put into your GI tract during the procedure and reduce the bloating. If you had a lower endoscopy (such as a colonoscopy or flexible sigmoidoscopy) you may notice spotting of blood in your stool or on the toilet paper. If you underwent a bowel prep for your procedure, you may not have a normal bowel movement for a few days.  Please Note:  You might notice some irritation and congestion in your nose or some drainage.  This is from the oxygen used during your procedure.  There is no need for concern and it should clear up in a day or so.  SYMPTOMS TO REPORT IMMEDIATELY:   Following lower endoscopy (colonoscopy or flexible sigmoidoscopy):  Excessive amounts of blood in the stool  Significant tenderness or worsening of abdominal pains  Swelling of the abdomen that is new, acute  Fever of 100F or higher   For urgent or emergent issues, a gastroenterologist can be reached at any hour by calling [phone].   DIET: Your first meal following the procedure should be a small meal and then it is ok to progress to your normal diet. Heavy or fried foods are harder to digest and may make you feel nauseous or bloated.  Likewise, meals heavy in dairy and vegetables can increase bloating.  Drink plenty of fluids but you should avoid alcoholic beverages for 24 hours. Increase  the fiber in your diet.  ACTIVITY:  You should plan to take it easy for the rest of today and you should NOT DRIVE or use heavy machinery until tomorrow (because of the sedation medicines used during the test).    FOLLOW UP: Our staff will call the number listed on your records the next business day following your procedure to check on you and address any questions or concerns that you may have regarding the information given to you following your procedure. If we do not reach you, we will leave a message.  However, if you are feeling well and you are not experiencing any problems, there is no need to return our call.  We will assume that you have returned to your regular daily activities without incident.  If any biopsies were taken you will be contacted by phone or by letter within the next 1-3 weeks.  Please call us at [phone] if you have not heard about the biopsies in 3 weeks.    SIGNATURES/CONFIDENTIALITY: You and/or your care partner have signed paperwork which will be entered into your electronic medical record.  These signatures attest to the fact that that the information above on your After Visit Summary has been reviewed and is understood.  Full responsibility of the confidentiality of this discharge information lies with you and/or your care-partner.  If your blood pressure continues to be elevated, please call your primary care physician or your cardiologist.  You may want to have your hemorrhoids banded.  See handouts.      Start back on your coumadin tonight.

## 2015-03-24 NOTE — Progress Notes (Signed)
Report to PACU, RN, vss, BBS= Clear.  

## 2015-03-24 NOTE — Op Note (Signed)
[place]  [address] [place], [id number]   FLEXIBLE SIGMOIDOSCOPY PROCEDURE REPORT  PATIENT: Dalton Vaughan, Walkins  MR#: [id number] BIRTHDATE: Dec 31, 1928 , 51  yrs. old GENDER: male ENDOSCOPIST: Jerene Bears, MD REFERRED BY: Redge Gainer, M.D. PROCEDURE DATE:  03/24/2015 PROCEDURE:   Sigmoidoscopy, diagnostic ASA CLASS:   Class III INDICATIONS:anorectal pain. MEDICATIONS: Monitored anesthesia care, Propofol 100 mg IV, and Lidocaine 150 mg IV  DESCRIPTION OF PROCEDURE:   After the risks benefits and alternatives of the procedure were thoroughly explained, informed consent was obtained.  Digital exam revealed no obvious fissure today or masses, perianal skin tags present, external hemorrhoids not seen.. The LB [id number]  endoscope was introduced through the anus  and advanced to the sigmoid colon , The exam was Without limitations.    The quality of the prep was adequate. . The instrument was then slowly withdrawn as the mucosa was fully examined.        COLON FINDINGS: There was moderate diverticulosis noted in the sigmoid colon.   The colonic mucosa appeared normal in the rectum and sigmoid colon.   No polyps or masses seen.  Retroflexed views revealed large internal hemorrhoids and a skin tag.    The scope was then withdrawn from the patient and the procedure terminated.  COMPLICATIONS: There were no immediate complications.  ENDOSCOPIC IMPRESSION: 1.   Moderate diverticulosis was noted in the sigmoid colon 2.   The colonic mucosa appeared normal in the rectum and sigmoid colon  RECOMMENDATIONS: 1.  Continue topical nitroglycerin ointment 0.125% 3 times daily 2.  Consider hemorrhoidal banding 3.  Rectal prolapse as previously suggested by imaging could still be an issue though not seen today.  Pelvic MRI could be performed if symptoms persist followed by surgical evaluation  eSigned:  Jerene Bears, MD 03/24/2015 3:29 PM  CC:the patient, Dr.  Laurance Flatten

## 2015-03-25 ENCOUNTER — Telehealth: Payer: Self-pay | Admitting: *Deleted

## 2015-03-25 NOTE — Telephone Encounter (Signed)
  Follow up Call-  Call back number 03/24/2015  Post procedure Call Back phone  # [phone]  Permission to leave phone message Yes     Patient questions:  Do you have a fever, pain , or abdominal swelling? No. Pain Score  0 *  Have you tolerated food without any problems? Yes.    Have you been able to return to your normal activities? Yes.    Do you have any questions about your discharge instructions: Diet   No. Medications  No. Follow up visit  yes  Do you have questions or concerns about your Care? No.  Actions: * If pain score is 4 or above.No action needed, pain <4.  Pt. States he thinks he is to have follow up.  It appeared that he was advised to think about hemorrhoid banding.  I told him to  Call office for appointment if he wanted to explore this possibility further.

## 2015-03-28 ENCOUNTER — Ambulatory Visit (INDEPENDENT_AMBULATORY_CARE_PROVIDER_SITE_OTHER): Payer: Medicare Other | Admitting: Pharmacist

## 2015-03-28 DIAGNOSIS — I4891 Unspecified atrial fibrillation: Secondary | ICD-10-CM

## 2015-03-28 LAB — POCT INR: INR: 2.3

## 2015-03-28 NOTE — Telephone Encounter (Signed)
  Follow up Call-  Call back number 03/24/2015  Post procedure Call Back phone  # [phone]  Permission to leave phone message Yes     Patient questions:  Do you have a fever, pain , or abdominal swelling? No. Pain Score  0 *  Have you tolerated food without any problems? Yes.    Have you been able to return to your normal activities? Yes.    Do you have any questions about your discharge instructions: Diet   No. Medications  No. Follow up visit  No.  Do you have questions or concerns about your Care? No.  Actions: * If pain score is 4 or above: No action needed, pain <4.

## 2015-03-28 NOTE — Patient Instructions (Signed)
Anticoagulation Dose Instructions as of 03/28/2015      Dalton Vaughan Tue Wed Thu Fri Sat   New Dose 2.5 mg 5 mg 2.5 mg 5 mg 2.5 mg 5 mg 2.5 mg    Description        Take 1/2 tablet today (4/11) and then continue same warfarin dose of 1 tablet on mondays, wednesdays and fridays and 1/2 tablet all other days.  Goal INR is 1.5 to 2.0.

## 2015-04-08 ENCOUNTER — Encounter: Payer: Self-pay | Admitting: Cardiology

## 2015-04-08 DIAGNOSIS — E785 Hyperlipidemia, unspecified: Secondary | ICD-10-CM | POA: Diagnosis not present

## 2015-04-08 DIAGNOSIS — I2510 Atherosclerotic heart disease of native coronary artery without angina pectoris: Secondary | ICD-10-CM | POA: Diagnosis not present

## 2015-04-08 DIAGNOSIS — I495 Sick sinus syndrome: Secondary | ICD-10-CM | POA: Diagnosis not present

## 2015-04-08 DIAGNOSIS — I480 Paroxysmal atrial fibrillation: Secondary | ICD-10-CM | POA: Diagnosis not present

## 2015-04-08 DIAGNOSIS — Z951 Presence of aortocoronary bypass graft: Secondary | ICD-10-CM | POA: Diagnosis not present

## 2015-04-08 DIAGNOSIS — Z8673 Personal history of transient ischemic attack (TIA), and cerebral infarction without residual deficits: Secondary | ICD-10-CM | POA: Diagnosis not present

## 2015-04-08 DIAGNOSIS — Z7901 Long term (current) use of anticoagulants: Secondary | ICD-10-CM | POA: Diagnosis not present

## 2015-04-08 DIAGNOSIS — I119 Hypertensive heart disease without heart failure: Secondary | ICD-10-CM | POA: Diagnosis not present

## 2015-04-08 DIAGNOSIS — I482 Chronic atrial fibrillation, unspecified: Secondary | ICD-10-CM

## 2015-04-08 NOTE — Progress Notes (Signed)
Patient ID: Dalton Vaughan, male   DOB: Mar 21, 1929, 81 y.o.   MRN: [id number]   Amun, Stemm    Date of visit:  04/08/2015 DOB:  07/07/1929    Age:  81 yrs. Medical record number:  [id number]     Account number:  [id number] Primary Care Provider: Redge Gainer ____________________________ CURRENT DIAGNOSES  1. Atherosclerotic heart disease of native coronary artery without angina pectoris  2. Long term (current) use of anticoagulants  3. Chronic atrial fibrillation  4. Sick sinus syndrome  5. Hypertensive heart disease without heart failure  6. Presence of aortocoronary bypass graft  7. Hyperlipidemia, unspecified  8. Personal history of transient ischemic attack (TIA), and cerebral infarction without residual deficits ____________________________ ALLERGIES  Ezetimibe, Muscle aches  Penicillins, Intolerance-unknown  Pravastatin, Muscle aches  Simvastatin, Muscle aches ____________________________ MEDICATIONS  1. multivitamin tablet, 1 p.o. q.d.  2. nitroglycerin 0.4 mg tablet, sublingual, PRN  3. coQ10 (ubiquinol) 100 mg capsule, 1 p.o. daily  4. ramipril 10 mg capsule, 1 p.o. daily  5. Remeron 15 mg tablet, 1/2 tab daily  6. Cardura 8 mg Tablet, 3/4 tab qd  7. Warfarin 5 mg Tablet, Take as directed  8. triamterene-hydrochlorothiazid 37.5-25 mg Tablet, 3/4 tab qd  9. folic acid 1 mg tablet, 1 p.o. daily  10. amlodipine 5 mg tablet, 1/2 tab b.i.d.  11. Stool Softener 100 mg tablet, prn  12. Crestor 10 mg tablet, 1/2 tab m-w-f  13. fluticasone 50 mcg/actuation spray,suspension, qd  14. azelastine 137 mcg aerosol,spray, QHS  15. levothyroxine 25 mcg tablet, 2 p.o. daily  16. Vitamin D3 5,000 unit tablet, 1 p.o. daily  17. Calcium 600 + D(3) 600 mg calcium-200 unit capsule, 1/2 tab daily ____________________________ CHIEF COMPLAINTS  Followup of Atherosclerotic heart disease of native coronary artery without angina pectoris  Followup of Chronic atrial  fibrillation ____________________________ HISTORY OF PRESENT ILLNESS Patient seen for cardiac followup. He has been doing well since he was previously here. He is exercising on a regular basis and his lipids were reviewed today and show good control. He denies angina and has no PND, orthopnea, syncope, palpitations, or claudication. He remains in chronic atrial fibrillation and his rate controlled today. He has no bleeding complications from warfarin. No recurrent TIA or stroke. ____________________________ PAST HISTORY  Past Medical Illnesses:  hypertension, hyperlipidemia, CVA without deficits January 2003, history of depression 2004, GERD, BPH, TIA May 2009;  Cardiovascular Illnesses:  arrhythmia-PVCs, sick sinus syndrome, CAD, atrial fibrillation;  Surgical Procedures:  CABG w LIMA to LAD, SVG to int, SVG to OM2, SVG to AM-RCA 11/22/98 Dr. Servando Snare, laminectomy lumbar, tonsillectomy/adenoids, inguinal herniorrhaphy-rt;  NYHA Classification:  I;  Canadian Angina Classification:  Class 0: Asymptomatic;  Cardiology Procedures-Invasive:  cardiac cath (left) November 1999;  Cardiology Procedures-Noninvasive:  treadmill cardiolite April 2008, echocardiogram October 2007, treadmill cardiolite May 2011, event monitor August 2013;  Cardiac Cath Results:  normal Left main, 80% stenosis proximal LAD, 90% stenosis proximal CFX, 70% stenosis proximal RCA, 99% stenosis distal RCA;  LVEF of 69% documented via nuclear study on 04/21/2010,  CHADS Score:  4,  CHA2DS2-VASC Score:  7 ____________________________ CARDIO-PULMONARY TEST DATES EKG Date:  04/08/2015;   Cardiac Cath Date:  11/04/1998;  CABG: 11/22/1998;  Holter/Event Monitor Date: 08/01/2012;  Nuclear Study Date:  05/11/2010;  Echocardiography Date: 09/30/2006;  Chest Xray Date: 02/21/2012;   ____________________________ FAMILY HISTORY Brother -- Brother alive and well Father -- Father dead, Death of unknown cause Mother -- Mother dead, Death due to natural  cause, Influenza/Pneumonia Sister -- Sister alive and well Sister -- Sister alive and well ____________________________ SOCIAL HISTORY Alcohol Use:  drinks occasionally and wine;  Smoking:  used to smoke but quit Prior to 1980;  Diet:  fat modified diet;  Lifestyle:  married;  Exercise:  exercises daily;  Occupation:  retired and Cytogeneticist at [employer];  Residence:  lives with wife, wife has multiple myeloma;   ____________________________ REVIEW OF SYSTEMS General:  malaise and fatigue Eyes: cataracts, wears eye glasses/contact lenses Respiratory: denies dyspnea, cough, wheezing or hemoptysis. Cardiovascular:  please review HPI Abdominal: denies dyspepsia, GI bleeding, constipation, or diarrhea Genitourinary-Male: nocturia  Musculoskeletal:  chronic low back pain Neurological:  denies headaches, stroke, or TIA  ____________________________ PHYSICAL EXAMINATION VITAL SIGNS  Blood Pressure:  140/70 Sitting, Left arm, regular cuff  , 138/74 Standing, Left arm and regular cuff   Pulse:  80/min. Weight:  181.00 lbs. Height:  70"BMI: 26  Constitutional:  pleasant white male in no acute distress Skin:  scattered seborrhic keratosis, tattoos Head:  normocephalic, balding male hair pattern ENT:  ears, nose and throat reveal no gross abnormalities.  Dentition good. Neck:  supple, no masses, thyromegaly, JVD. Carotid pulses are full and equal bilaterally without bruits. Chest:  healed median sternotomy scar, clear to auscultation Cardiac:  regular rhythm, normal S1 and S2, No S3 or S4, no murmurs, gallops or rubs detected. Peripheral Pulses:  pulses full and equal in all extremities Extremities & Back:  well healed saphenous vein donor site RLE, trace edema Neurological:  no gross motor or sensory deficits noted, affect appropriate, oriented x3. ____________________________ MOST RECENT LIPID PANEL 09/07/14  CHOL TOTL 170 mg/dl, LDL 109 NM, HDL 47 mg/dl, TRIGLYCER 68 mg/dl, ALT  12 u/l, ALK PHOS 80 u/l and AST 16 u/l ____________________________ IMPRESSIONS/PLAN  1. Chronic atrial fibrillation currently rate controlled 2. Chronic anticoagulation without complications 3. Coronary artery disease with previous bypass grafting with no angina 4. History of TIA without recurrence  Recommendations:  Continue warfarin anticoagulation and current medical treatment. Blood pressure is controlled today. He is intolerant to virtually all of the statin agents previously. Followup in 6 months. EKG shows atrial fibrillation with controlled response.  ____________________________ TODAYS ORDERS  1. 12 Lead EKG: Today  2. Return Visit: 6 months                       ____________________________ Cardiology Physician:  Kerry Hough MD [facility]

## 2015-05-03 ENCOUNTER — Other Ambulatory Visit: Payer: Self-pay | Admitting: Family Medicine

## 2015-05-03 MED ORDER — LEVOTHYROXINE SODIUM 50 MCG PO TABS: 50.0000 ug | ORAL_TABLET | Freq: Every day | ORAL | Status: DC

## 2015-05-03 MED ORDER — AMLODIPINE BESYLATE 5 MG PO TABS: ORAL_TABLET | ORAL | Status: DC

## 2015-05-03 MED ORDER — FOLIC ACID 1 MG PO TABS: 1.0000 mg | ORAL_TABLET | Freq: Every day | ORAL | Status: DC

## 2015-05-04 ENCOUNTER — Ambulatory Visit (INDEPENDENT_AMBULATORY_CARE_PROVIDER_SITE_OTHER): Payer: Medicare Other | Admitting: Internal Medicine

## 2015-05-04 ENCOUNTER — Encounter: Payer: Self-pay | Admitting: Internal Medicine

## 2015-05-04 VITALS — BP 154/80 | HR 64 | Ht 70.25 in | Wt 186.0 lb

## 2015-05-04 DIAGNOSIS — K623 Rectal prolapse: Secondary | ICD-10-CM | POA: Diagnosis not present

## 2015-05-04 DIAGNOSIS — K648 Other hemorrhoids: Secondary | ICD-10-CM | POA: Diagnosis not present

## 2015-05-04 DIAGNOSIS — R102 Pelvic and perineal pain: Secondary | ICD-10-CM | POA: Diagnosis not present

## 2015-05-04 NOTE — Progress Notes (Signed)
Subjective:    Patient ID: Dalton Vaughan, male    DOB: Mar 12, 1929, 81 y.o.   MRN: [id number]  HPI [name] is an 81 yo male with PMH of  diverticulosis, chronic perianal pain, hemorrhoid disease, A. fib on warfarin, hypertension, hyperlipidemia, CAD and hypothyroidism who seen in follow-up. I initially saw him on 03/08/2015 to evaluate anorectal pain. This is been present for over 2 years. Flexible sigmoidoscopy was performed on 03/24/2015 and revealed moderate diverticulosis in the sigmoid but otherwise normal-appearing colonic mucosa. No anal fissure was present on that day but large internal hemorrhoids and perianal skin tags were seen. No rectal prolapse was obvious on this day.  He has been using nitroglycerin ointment 3 times a day for nearly 8 weeks. This is helped the pain somewhat particularly the pain in the anal canal. He still having pain just to the left of the anal canal and it feels deep in his buttocks. This pain seems to be worsened by straining or hard stool. He is using Colace once a day and this does help soften stool. He estimates he has a hard stool requiring straining 1 or 2 days a week. No further bleeding. Some prolapsed hemorrhoids which seemed to spontaneously reduce. He denies fecal smearing and itching.  Review of Systems As per history of present illness, otherwise negative  Current Medications, Allergies, Past Medical History, Past Surgical History, Family History and Social History were reviewed in [employer] record.     Objective:   Physical Exam BP 154/80 mmHg  Pulse 64  Ht 5' 10.25" (1.784 m)  Wt 186 lb (84.369 kg)  BMI 26.51 kg/m2 Constitutional: Well-developed and well-nourished. No distress. HEENT: Normocephalic and atraumatic. Marland Kitchen Conjunctivae are normal.  No scleral icterus. Neck: Neck supple. Trachea midline. Abdominal: Soft, nontender, nondistended. Bowel sounds active throughout.  Extremities: no clubbing, cyanosis, or  edema Neurological: Alert and oriented to person place and time. Psychiatric: Normal mood and affect. Behavior is normal.  Pertinent imaging, CT scan January 2014 -- rectal prolapse.     Assessment & Plan:  81 yo male with PMH of  diverticulosis, chronic perianal pain, hemorrhoid disease, A. fib on warfarin, hypertension, hyperlipidemia, CAD and hypothyroidism who seen in follow-up.  1. Perianal pain and rectal prolapse/hemorrhoids -- given his symptoms, specifically perianal, and deep left buttock pain are felt to be most likely secondary to rectal prolapse. Symptoms exacerbated by constipation and straining. He does have large internal hemorrhoids, but I do not feel internal hemorrhoids explain his pain. They are also not bleeding or leading to fecal incontinence/smearing. They are prolapsing Summit this doesn't seem to bother him. He has benefited from nitroglycerin, anal fissure has healed and may be benefiting from nitroglycerin also as an anti-spasmodic of the external sphincter. --Pelvic floor MRI, dynamic MRI requested but apparently this is not done here --If prolapse again proven, would recommend surgical referral for at least consideration of surgical correction. I discussed this with him and given the pain associated with this process, he is willing to consider surgery. Medical therapy is limited --Increase Colace to 100 mg twice daily to avoid hard stools, constipation and straining --Continue nitroglycerin ointment 3 times a day if this is helping with spasm  2. Internal hemorrhoids -- hemorrhoidal banding is an option though we would likely need to hold warfarin. Given the prolapse is more likely cause of symptoms, I would prefer to address this issue first. He understands this reasoning   The primary indications for  a surgical repair include the sensation of a mass from the prolapsed bowel, and fecal incontinence and/or constipation associated with rectal procidentia. The simple  presence of rectal prolapse is an indication for surgical repair because of the eventual progression of symptoms, weakening of the sphincter complex, and risk of incarceration, although the optimal surgical approach has not been elucidated [4]. In general, patients with procidentia should undergo a surgical consultation to determine operative eligibility.

## 2015-05-04 NOTE — Patient Instructions (Addendum)
Please purchase the following medications over the counter and take as directed: Colace 2 tablets daily (for constipation)  You have been scheduled for an MRI of Pelvis at [facility] on Wednesday 05/18/15. Your appointment time is 8:00 am. Please arrive 15 minutes prior to your appointment time for registration purposes. Please make certain not to have anything to eat or drink 4 hours prior to your test. In addition, if you have any metal in your body, have a pacemaker or defibrillator, please be sure to let your ordering physician know. This test typically takes 45 minutes to 1 hour to complete.  Please follow up with your primary care provider regarding your elevated blood pressure.

## 2015-05-10 ENCOUNTER — Ambulatory Visit (INDEPENDENT_AMBULATORY_CARE_PROVIDER_SITE_OTHER): Payer: Medicare Other | Admitting: Pharmacist Clinician (PhC)/ Clinical Pharmacy Specialist

## 2015-05-10 DIAGNOSIS — I482 Chronic atrial fibrillation, unspecified: Secondary | ICD-10-CM

## 2015-05-10 LAB — POCT INR: INR: 1.6

## 2015-05-17 ENCOUNTER — Ambulatory Visit: Payer: Medicare Other | Admitting: Internal Medicine

## 2015-05-18 ENCOUNTER — Ambulatory Visit
Admission: RE | Admit: 2015-05-18 | Discharge: 2015-05-18 | Disposition: A | Discharge status: Home / Self Care | Payer: Medicare Other | Source: Ambulatory Visit | Attending: Internal Medicine | Admitting: Internal Medicine

## 2015-05-18 ENCOUNTER — Other Ambulatory Visit: Payer: Self-pay

## 2015-05-18 ENCOUNTER — Ambulatory Visit (HOSPITAL_COMMUNITY): Admission: RE | Admit: 2015-05-18 | Payer: Medicare Other | Source: Ambulatory Visit

## 2015-05-18 ENCOUNTER — Other Ambulatory Visit: Payer: Self-pay | Admitting: Internal Medicine

## 2015-05-18 DIAGNOSIS — R102 Pelvic and perineal pain: Secondary | ICD-10-CM

## 2015-05-18 DIAGNOSIS — K6289 Other specified diseases of anus and rectum: Secondary | ICD-10-CM | POA: Diagnosis not present

## 2015-05-18 DIAGNOSIS — K5730 Diverticulosis of large intestine without perforation or abscess without bleeding: Secondary | ICD-10-CM | POA: Diagnosis not present

## 2015-05-20 ENCOUNTER — Other Ambulatory Visit: Payer: Self-pay | Admitting: Family Medicine

## 2015-05-20 DIAGNOSIS — N410 Acute prostatitis: Secondary | ICD-10-CM | POA: Diagnosis not present

## 2015-05-20 DIAGNOSIS — N401 Enlarged prostate with lower urinary tract symptoms: Secondary | ICD-10-CM | POA: Diagnosis not present

## 2015-05-20 DIAGNOSIS — R3916 Straining to void: Secondary | ICD-10-CM | POA: Diagnosis not present

## 2015-05-20 DIAGNOSIS — R972 Elevated prostate specific antigen [PSA]: Secondary | ICD-10-CM | POA: Diagnosis not present

## 2015-05-20 DIAGNOSIS — N402 Nodular prostate without lower urinary tract symptoms: Secondary | ICD-10-CM | POA: Diagnosis not present

## 2015-05-24 ENCOUNTER — Ambulatory Visit (INDEPENDENT_AMBULATORY_CARE_PROVIDER_SITE_OTHER): Payer: Medicare Other | Admitting: Family Medicine

## 2015-05-24 ENCOUNTER — Encounter: Payer: Self-pay | Admitting: Family Medicine

## 2015-05-24 VITALS — BP 146/77 | HR 60 | Temp 97.0°F | Ht 70.25 in | Wt 185.0 lb

## 2015-05-24 DIAGNOSIS — K219 Gastro-esophageal reflux disease without esophagitis: Secondary | ICD-10-CM

## 2015-05-24 DIAGNOSIS — E785 Hyperlipidemia, unspecified: Secondary | ICD-10-CM

## 2015-05-24 DIAGNOSIS — G8929 Other chronic pain: Secondary | ICD-10-CM | POA: Diagnosis not present

## 2015-05-24 DIAGNOSIS — N400 Enlarged prostate without lower urinary tract symptoms: Secondary | ICD-10-CM

## 2015-05-24 DIAGNOSIS — K6289 Other specified diseases of anus and rectum: Secondary | ICD-10-CM

## 2015-05-24 DIAGNOSIS — I119 Hypertensive heart disease without heart failure: Secondary | ICD-10-CM

## 2015-05-24 DIAGNOSIS — E039 Hypothyroidism, unspecified: Secondary | ICD-10-CM

## 2015-05-24 DIAGNOSIS — E559 Vitamin D deficiency, unspecified: Secondary | ICD-10-CM | POA: Diagnosis not present

## 2015-05-24 MED ORDER — NAPROXEN 500 MG PO TBEC: 500.0000 mg | DELAYED_RELEASE_TABLET | Freq: Two times a day (BID) | ORAL | Status: DC

## 2015-05-24 MED ORDER — NAPROXEN 375 MG PO TBEC: 1.0000 | DELAYED_RELEASE_TABLET | Freq: Two times a day (BID) | ORAL | Status: DC

## 2015-05-24 NOTE — Progress Notes (Signed)
Subjective:    Patient ID: Dalton Vaughan, male    DOB: [birth year], 81 y.o.   MRN: [id number]  HPI Pt here for follow up and management of chronic medical problems which includes hypertension, hypothyroid, and hyperlipidemia. He is taking medications regularly. The patient was recently seen by Dr. Zenovia Jarred and he was recommending that the patient try amitriptyline 10 mg regarding his pelvic and rectal pain when no findings were found by him from his exam. The patient has recently been started on meloxicam 15 mg 1 daily for his prostate. We will discuss the initiation of the amitriptyline with his cardiologist before doing that and we will also make sure that his meloxicam and his Coumadin will not increase his risk for bleeding. He is due to get lab work but will return to the office fasting for this. The patient usually sees the cardiologist every 6 months and he has been seeing the urologist once a year. He does not have any appointment to go back and see the gastroenterologist. The patient denies chest pain shortness of breath problems with his stomach or voiding issues at the present time.       Patient Active Problem List   Diagnosis Date Noted  . Allergic rhinitis 05/06/2014  . Depression 08/03/2013  . Sick sinus syndrome 07/31/2012  . Hypertensive heart disease without CHF   . Chronic atrial fibrillation   . CAD (coronary artery disease)   . History of TIAs   . Lumbar disc disease   . GERD (gastroesophageal reflux disease)   . Long-term (current) use of anticoagulants   . BPH (benign prostatic hypertrophy)   . Hyperlipidemia    Outpatient Encounter Prescriptions as of 05/24/2015  Medication Sig  . amLODipine (NORVASC) 5 MG tablet TAKE ONE-HALF (1/2) TABLET TWICE A DAY  . azelastine (ASTELIN) 137 MCG/SPRAY nasal spray USE 1 TO 2 SPRAYS IN EACH NOSTRIL TWICE A DAY  . Cholecalciferol (VITAMIN D-3) 5000 UNITS TABS Take 1 tablet by mouth daily.   . Coenzyme Q10 (CO Q 10) 100 MG CAPS  Take 1 capsule by mouth daily.  Marland Kitchen doxazosin (CARDURA) 8 MG tablet Take 1 tablet (8 mg total) by mouth at bedtime. (Patient taking differently: Take 8 mg by mouth at bedtime. Pt takes 6 mg)  . fluticasone (FLONASE) 50 MCG/ACT nasal spray USE 1 TO 2 SPRAYS IN EACH NOSTRIL DAILY  . folic acid (FOLVITE) 1 MG tablet Take 1 tablet (1 mg total) by mouth daily.  Marland Kitchen levothyroxine (SYNTHROID, LEVOTHROID) 50 MCG tablet Take 1 tablet (50 mcg total) by mouth daily.  . meloxicam (MOBIC) 15 MG tablet Take 15 mg by mouth daily.  . mirtazapine (REMERON) 15 MG tablet TAKE 1 TABLET DAILY (Patient taking differently: TAKE 1 TABLET DAILY; Pt takes 7.5 mg)  . Multiple Vitamin (MULTIVITAMIN) tablet Take 1 tablet by mouth daily.    . ramipril (ALTACE) 10 MG capsule TAKE 1 CAPSULE DAILY  . rosuvastatin (CRESTOR) 10 MG tablet Take 1 tablet (10 mg total) by mouth at bedtime. (Patient taking differently: Take 10 mg by mouth at bedtime. Pt takes 5 mg)  . triamterene-hydrochlorothiazide (MAXZIDE-25) 37.5-25 MG per tablet Take 1 tablet by mouth daily.  . vitamin C (ASCORBIC ACID) 500 MG tablet Take 500 mg by mouth daily. Take 1/4 tablet by mouth daily.  Marland Kitchen warfarin (COUMADIN) 5 MG tablet TAKE AS DIRECTED (Patient taking differently: TAKE AS DIRECTED; Pt takes 2.5 mg on Tues, Thurs, Sat & Sun; Takes 5 mg on Mon,  Wed, Fri)   No facility-administered encounter medications on file as of 05/24/2015.     Review of Systems  Constitutional: Negative.   HENT: Negative.   Eyes: Negative.   Respiratory: Negative.   Cardiovascular: Negative.   Gastrointestinal: Negative.   Endocrine: Negative.   Genitourinary: Negative.   Musculoskeletal: Negative.   Skin: Negative.   Allergic/Immunologic: Negative.   Neurological: Negative.   Hematological: Negative.   Psychiatric/Behavioral: Negative.        Objective:   Physical Exam  Constitutional: He is oriented to person, place, and time. He appears well-developed and well-nourished.  No distress.  HENT:  Head: Normocephalic and atraumatic.  Right Ear: External ear normal.  Left Ear: External ear normal.  Nose: Nose normal.  Mouth/Throat: Oropharynx is clear and moist. No oropharyngeal exudate.  Eyes: Conjunctivae and EOM are normal. Pupils are equal, round, and reactive to light. Right eye exhibits no discharge. Left eye exhibits no discharge. No scleral icterus.  Neck: Normal range of motion. Neck supple. No thyromegaly present.  No anterior cervical nodes or carotid bruits were audible  Cardiovascular: Normal rate, normal heart sounds and intact distal pulses.   No murmur heard. The heart is irregular irregular at 72/m  Pulmonary/Chest: Effort normal and breath sounds normal. No respiratory distress. He has no wheezes. He has no rales. He exhibits no tenderness.  The chest is clear anteriorly and posteriorly and there is no axillary adenopathy  Abdominal: Soft. Bowel sounds are normal. He exhibits no mass. There is no tenderness. There is no rebound and no guarding.  There was no abdominal tenderness or organ enlargement or abdominal bruits. There are no inguinal nodes.  Musculoskeletal: Normal range of motion. He exhibits no edema.  Lymphadenopathy:    He has no cervical adenopathy.  Neurological: He is alert and oriented to person, place, and time. He has normal reflexes. No cranial nerve deficit.  Skin: Skin is warm and dry. No rash noted. No erythema. No pallor.  Psychiatric: He has a normal mood and affect. His behavior is normal. Judgment and thought content normal.  Nursing note and vitals reviewed.  BP 146/77 mmHg  Pulse 60  Temp(Src) 97 F (36.1 C) (Oral)  Ht 5' 10.25" (1.784 m)  Wt 185 lb (83.915 kg)  BMI 26.37 kg/m2        Assessment & Plan:  1. BPH (benign prostatic hypertrophy) -The patient is being followed by the urologist for this and is awaiting a recent PSA result in return appointment plan - POCT CBC; Future  2. Hyperlipidemia -For  the present time he will continue with current treatment and will return for lab work fasting in any changes in medication will be resulted from that lab work - POCT CBC; Future - NMR, lipoprofile; Future  3. Gastroesophageal reflux disease, esophagitis presence not specified -This is stable and the presence having no problems with this but he was made aware that problems could occur with taking the Naprosyn and that he should take it twice a day after breakfast and supper and if any problems develop he should discontinue it immediately - POCT CBC; Future - Hepatic function panel; Future  4. hypertension -The blood pressure slightly elevated today and we will continue to monitor this - POCT CBC; Future - BMP8+EGFR; Future - Hepatic function panel; Future  5. Vitamin D deficiency -The patient will have his vitamin D level checked in his blood work and any changes in treatment will be resulted from the blood work -  POCT CBC; Future - Vit D  25 hydroxy (rtn osteoporosis monitoring); Future  6. Hypothyroidism, unspecified hypothyroidism type -He should continue current treatment pending results of lab work - POCT CBC; Future  7. Chronic rectal pain -The patient has seen the gastroenterologist and the urologist. He was started on meloxicam by the urologist but after consulting with our clinical pharmacist it was recommended that we change to Naprosyn enteric-coated as this would be safer with his Coumadin. He will discontinue the meloxicam. If this does not help his pain we will follow the gastroenterologist recommendation and let him on amitriptyline 10 mg as this was okayed by the cardiologist today during a conversation with him.  Meds ordered this encounter  Medications  . meloxicam (MOBIC) 15 MG tablet    Sig: Take 15 mg by mouth daily.   This has been discontinued and Naprosyn enteric-coated 375 mg was substituted instead  Patient Instructions                       Medicare Annual  Wellness Visit  [facility] and the medical providers at [facility] strive to bring you the best medical care.  In doing so we not only want to address your current medical conditions and concerns but also to detect new conditions early and prevent illness, disease and health-related problems.    Medicare offers a yearly Wellness Visit which allows our clinical staff to assess your need for preventative services including immunizations, lifestyle education, counseling to decrease risk of preventable diseases and screening for fall risk and other medical concerns.    This visit is provided free of charge (no copay) for all Medicare recipients. The clinical pharmacists at [facility] have begun to conduct these Wellness Visits which will also include a thorough review of all your medications.    As you primary medical provider recommend that you make an appointment for your Annual Wellness Visit if you have not done so already this year.  You may set up this appointment before you leave today or you may call back ([phone]) and schedule an appointment.  Please make sure when you call that you mention that you are scheduling your Annual Wellness Visit with the clinical pharmacist so that the appointment may be made for the proper length of time.     Continue current medications. Continue good therapeutic lifestyle changes which include good diet and exercise. Fall precautions discussed with patient. If an FOBT was given today- please return it to our front desk. If you are over 58 years old - you may need Prevnar 26 or the adult Pneumonia vaccine.  Flu Shots are still available at our office. If you still haven't had one please call to set up a nurse visit to get one.   After your visit with [place] today you will receive a survey in the mail or online from [employer] regarding your care with [place]. Please take a moment to fill this out. Your feedback is very  important to [place] as you can help [place] better understand your patient needs as well as improve your experience and satisfaction. WE CARE ABOUT YOU!!!   The patient will discontinue his meloxicam and we will call in a new prescription for Naprosyn 375 enteric-coated to try for the pelvic pain as recommended by his urologist. He will come in in a couple weeks and get a ProTime repeated We did discuss with the cardiologist the situation and he  felt like if we decided to try amitriptyline 10 mg would be okay to try in this patient. The patient should watch closely for abdominal pain and dark stools our blood in the stools. He should call us immediately if he notices any of those   Arrie Senate MD

## 2015-05-24 NOTE — Addendum Note (Signed)
Addended by: Zannie Cove on: 05/24/2015 11:15 AM   Modules accepted: Orders

## 2015-05-24 NOTE — Patient Instructions (Addendum)
Medicare Annual Wellness Visit  [facility] and the medical providers at [facility] strive to bring you the best medical care.  In doing so we not only want to address your current medical conditions and concerns but also to detect new conditions early and prevent illness, disease and health-related problems.    Medicare offers a yearly Wellness Visit which allows our clinical staff to assess your need for preventative services including immunizations, lifestyle education, counseling to decrease risk of preventable diseases and screening for fall risk and other medical concerns.    This visit is provided free of charge (no copay) for all Medicare recipients. The clinical pharmacists at [facility] have begun to conduct these Wellness Visits which will also include a thorough review of all your medications.    As you primary medical provider recommend that you make an appointment for your Annual Wellness Visit if you have not done so already this year.  You may set up this appointment before you leave today or you may call back [phone]) and schedule an appointment.  Please make sure when you call that you mention that you are scheduling your Annual Wellness Visit with the clinical pharmacist so that the appointment may be made for the proper length of time.     Continue current medications. Continue good therapeutic lifestyle changes which include good diet and exercise. Fall precautions discussed with patient. If an FOBT was given today- please return it to our front desk. If you are over 34 years old - you may need Prevnar 110 or the adult Pneumonia vaccine.  Flu Shots are still available at our office. If you still haven't had one please call to set up a nurse visit to get one.   After your visit with [place] today you will receive a survey in the mail or online from [employer] regarding your care with [place]. Please take a moment to  fill this out. Your feedback is very important to [place] as you can help [place] better understand your patient needs as well as improve your experience and satisfaction. WE CARE ABOUT YOU!!!   The patient will discontinue his meloxicam and we will call in a new prescription for Naprosyn 375 enteric-coated to try for the pelvic pain as recommended by his urologist. He will come in in a couple weeks and get a ProTime repeated We did discuss with the cardiologist the situation and he felt like if we decided to try amitriptyline 10 mg would be okay to try in this patient. The patient should watch closely for abdominal pain and dark stools our blood in the stools. He should call us immediately if he notices any of those

## 2015-06-01 ENCOUNTER — Other Ambulatory Visit (INDEPENDENT_AMBULATORY_CARE_PROVIDER_SITE_OTHER): Payer: Medicare Other

## 2015-06-01 DIAGNOSIS — E559 Vitamin D deficiency, unspecified: Secondary | ICD-10-CM | POA: Diagnosis not present

## 2015-06-01 DIAGNOSIS — K219 Gastro-esophageal reflux disease without esophagitis: Secondary | ICD-10-CM | POA: Diagnosis not present

## 2015-06-01 DIAGNOSIS — N400 Enlarged prostate without lower urinary tract symptoms: Secondary | ICD-10-CM

## 2015-06-01 DIAGNOSIS — I119 Hypertensive heart disease without heart failure: Secondary | ICD-10-CM | POA: Diagnosis not present

## 2015-06-01 DIAGNOSIS — E039 Hypothyroidism, unspecified: Secondary | ICD-10-CM

## 2015-06-01 DIAGNOSIS — E785 Hyperlipidemia, unspecified: Secondary | ICD-10-CM | POA: Diagnosis not present

## 2015-06-01 LAB — POCT CBC
Granulocyte percent: 58.3 %G (ref 37–80)
HCT, POC: 42.4 % — AB (ref 43.5–53.7)
Hemoglobin: 13.9 g/dL — AB (ref 14.1–18.1)
Lymph, poc: 2 (ref 0.6–3.4)
MCH, POC: 29.6 pg (ref 27–31.2)
MCHC: 32.7 g/dL (ref 31.8–35.4)
MCV: 90.5 fL (ref 80–97)
MPV: 7.8 fL (ref 0–99.8)
POC Granulocyte: 3.4 (ref 2–6.9)
POC LYMPH PERCENT: 34.2 %L (ref 10–50)
Platelet Count, POC: 147 10*3/uL (ref 142–424)
RBC: 4.69 M/uL (ref 4.69–6.13)
RDW, POC: 15.1 %
WBC: 5.8 10*3/uL (ref 4.6–10.2)

## 2015-06-01 NOTE — Progress Notes (Signed)
Lab only 

## 2015-06-02 LAB — HEPATIC FUNCTION PANEL
ALT: 8 IU/L (ref 0–44)
AST: 15 IU/L (ref 0–40)
Albumin: 4.1 g/dL (ref 3.5–4.7)
Alkaline Phosphatase: 81 IU/L (ref 39–117)
Bilirubin Total: 0.6 mg/dL (ref 0.0–1.2)
Bilirubin, Direct: 0.2 mg/dL (ref 0.00–0.40)
Total Protein: 6.5 g/dL (ref 6.0–8.5)

## 2015-06-02 LAB — BMP8+EGFR
BUN/Creatinine Ratio: 10 (ref 10–22)
BUN: 9 mg/dL (ref 8–27)
CO2: 29 mmol/L (ref 18–29)
Calcium: 9.4 mg/dL (ref 8.6–10.2)
Chloride: 105 mmol/L (ref 97–108)
Creatinine, Ser: 0.9 mg/dL (ref 0.76–1.27)
GFR calc Af Amer: 91 mL/min/{1.73_m2} (ref >59)
GFR calc non Af Amer: 79 mL/min/{1.73_m2} (ref >59)
Glucose: 90 mg/dL (ref 65–99)
Potassium: 4.2 mmol/L (ref 3.5–5.2)
Sodium: 144 mmol/L (ref 134–144)

## 2015-06-02 LAB — NMR, LIPOPROFILE
Cholesterol: 152 mg/dL (ref 100–199)
HDL Cholesterol by NMR: 48 mg/dL (ref >39)
HDL Particle Number: 27.6 umol/L — ABNORMAL LOW (ref >=30.5)
LDL Particle Number: 1218 nmol/L — ABNORMAL HIGH (ref <1000)
LDL Size: 20.9 nm (ref >20.5)
LDL-C: 89 mg/dL (ref 0–99)
LP-IR Score: 25 (ref <=45)
Small LDL Particle Number: 580 nmol/L — ABNORMAL HIGH (ref <=527)
Triglycerides by NMR: 76 mg/dL (ref 0–149)

## 2015-06-02 LAB — VITAMIN D 25 HYDROXY (VIT D DEFICIENCY, FRACTURES): Vit D, 25-Hydroxy: 61.4 ng/mL (ref 30.0–100.0)

## 2015-06-14 ENCOUNTER — Ambulatory Visit (INDEPENDENT_AMBULATORY_CARE_PROVIDER_SITE_OTHER): Payer: Medicare Other | Admitting: Pharmacist Clinician (PhC)/ Clinical Pharmacy Specialist

## 2015-06-14 DIAGNOSIS — I482 Chronic atrial fibrillation, unspecified: Secondary | ICD-10-CM

## 2015-06-14 LAB — POCT INR: INR: 1.6

## 2015-08-01 ENCOUNTER — Ambulatory Visit (INDEPENDENT_AMBULATORY_CARE_PROVIDER_SITE_OTHER): Payer: Medicare Other | Admitting: Pharmacist

## 2015-08-01 DIAGNOSIS — I482 Chronic atrial fibrillation: Secondary | ICD-10-CM

## 2015-08-01 DIAGNOSIS — E785 Hyperlipidemia, unspecified: Secondary | ICD-10-CM | POA: Diagnosis not present

## 2015-08-01 DIAGNOSIS — R972 Elevated prostate specific antigen [PSA]: Secondary | ICD-10-CM | POA: Diagnosis not present

## 2015-08-01 LAB — POCT INR: INR: 1.7

## 2015-08-01 NOTE — Patient Instructions (Signed)
Anticoagulation Dose Instructions as of 08/01/2015      [name] Tue Wed Thu Fri Sat   New Dose 2.5 mg 5 mg 2.5 mg 5 mg 2.5 mg 5 mg 2.5 mg    Description        Continue current warfarin dose of 59m 1 tablet mondays, wednesdays and fridays and 1/2 tablet all other days. Goal INR is 1.5 to 2.0.       INR was 1.7 today

## 2015-08-16 ENCOUNTER — Other Ambulatory Visit: Payer: Self-pay | Admitting: Family Medicine

## 2015-08-30 ENCOUNTER — Ambulatory Visit (INDEPENDENT_AMBULATORY_CARE_PROVIDER_SITE_OTHER): Payer: Medicare Other | Admitting: Nurse Practitioner

## 2015-08-30 ENCOUNTER — Encounter: Payer: Self-pay | Admitting: Nurse Practitioner

## 2015-08-30 VITALS — BP 167/89 | HR 54 | Temp 97.3°F | Ht 70.25 in | Wt 186.2 lb

## 2015-08-30 DIAGNOSIS — J0100 Acute maxillary sinusitis, unspecified: Secondary | ICD-10-CM | POA: Diagnosis not present

## 2015-08-30 MED ORDER — AZITHROMYCIN 250 MG PO TABS: ORAL_TABLET | ORAL | Status: DC

## 2015-08-30 NOTE — Patient Instructions (Signed)

## 2015-08-30 NOTE — Progress Notes (Signed)
  Subjective:     Dalton Vaughan is a 81 y.o. male who presents for evaluation of sinus pain. Symptoms include: congestion, cough, facial pain, headaches, nasal congestion, post nasal drip, sinus pressure and sore throat. Onset of symptoms was 5 days ago. Symptoms have been unchanged since that time. Past history is significant for occasional episodes of bronchitis. Patient is a non-smoker.  The following portions of the patient's history were reviewed and updated as appropriate: allergies, current medications, past family history, past medical history, past social history, past surgical history and problem list.  Review of Systems Pertinent items are noted in HPI.   Objective:    BP 167/89 mmHg  Pulse 54  Temp(Src) 97.3 F (36.3 C) (Oral)  Ht 5' 10.25" (1.784 m)  Wt 186 lb 3.2 oz (84.46 kg)  BMI 26.54 kg/m2 General appearance: alert and cooperative Eyes: conjunctivae/corneas clear. PERRL, EOM's intact. Fundi benign. Ears: normal TM's and external ear canals both ears Nose: clear discharge, moderate congestion, turbinates red, sinus tenderness bilateral Throat: lips, mucosa, and tongue normal; teeth and gums normal Neck: no adenopathy, no carotid bruit, no JVD, supple, symmetrical, trachea midline and thyroid not enlarged, symmetric, no tenderness/mass/nodules Lungs: clear to auscultation bilaterally Heart: regular rate and rhythm, S1, S2 normal, no murmur, click, rub or gallop    Assessment:    Acute bacterial sinusitis.    Plan:  1. Take meds as prescribed 2. Use a cool mist humidifier especially during the winter months and when heat has been humid. 3. Use saline nose sprays frequently 4. Saline irrigations of the nose can be very helpful if done frequently.  * 4X daily for 1 week*  * Use of a nettie pot can be helpful with this. Follow directions with this* 5. Drink plenty of fluids 6. Keep thermostat turn down low 7.For any cough or congestion  Use plain Mucinex- regular  strength or max strength is fine   * Children- consult with Pharmacist for dosing 8. For fever or aces or pains- take tylenol or ibuprofen appropriate for age and weight.  * for fevers greater than 101 orally you may alternate ibuprofen and tylenol every  3 hours.    Meds ordered this encounter  Medications  . azithromycin (ZITHROMAX Z-PAK) 250 MG tablet    Sig: As directed    Dispense:  1 each    Refill:  0    Order Specific Question:  Supervising Provider    Answer:  Chipper Herb [[id number]]   [place], FNP

## 2015-09-01 ENCOUNTER — Ambulatory Visit (INDEPENDENT_AMBULATORY_CARE_PROVIDER_SITE_OTHER): Payer: Medicare Other | Admitting: Pharmacist

## 2015-09-01 ENCOUNTER — Encounter: Payer: Self-pay | Admitting: Pharmacist

## 2015-09-01 VITALS — BP 134/82 | HR 69 | Ht 70.0 in | Wt 181.0 lb

## 2015-09-01 DIAGNOSIS — I482 Chronic atrial fibrillation, unspecified: Secondary | ICD-10-CM

## 2015-09-01 DIAGNOSIS — Z0000 Encounter for general adult medical examination without abnormal findings: Secondary | ICD-10-CM | POA: Diagnosis not present

## 2015-09-01 LAB — POCT INR: INR: 1.5

## 2015-09-01 MED ORDER — DICLOFENAC SODIUM 1 % TD GEL: 2.0000 g | Freq: Four times a day (QID) | TRANSDERMAL | Status: DC | PRN

## 2015-09-01 NOTE — Patient Instructions (Signed)
Dalton Vaughan , Thank you for taking time to come for your Medicare Wellness Visit. I appreciate your ongoing commitment to your health goals. Please review the following plan we discussed and let me know if I can assist you in the future.   These are the goals we discussed: Increase physical activity - goal is 30 minutes at least 4 days per week. Trial of volteran gel - apply to area of pain as needed up to 4 times a day   This is a list of the screening recommended for you and due dates:  Health Maintenance  Topic Date Due  . Flu Shot  Will get at appointment with Dalton Vaughan  . Pneumonia vaccines (2 of 2 - PPSV23) completed  . Colon Cancer Screening  12/18/2015  . Tetanus Vaccine  12/18/2015  . Shingles Vaccine  Completed  *Topic was postponed. The date shown is not the original due date.    Anticoagulation Dose Instructions as of 09/01/2015      Dalton Vaughan Tue Wed Thu Fri Sat   New Dose 2.5 mg 5 mg 2.5 mg 5 mg 2.5 mg 5 mg 2.5 mg    Description        Continue current warfarin dose of 41m 1 tablet mondays, wednesdays and fridays and 1/2 tablet all other days. Goal INR is 1.5 to 2.0.       INR was 1.5 today  Health Maintenance A healthy lifestyle and preventative care can promote health and wellness.  Maintain regular health, dental, and eye exams.  Eat a healthy diet. Foods like vegetables, fruits, whole grains, low-fat dairy products, and lean protein foods contain the nutrients you need and are low in calories. Decrease your intake of foods high in solid fats, added sugars, and salt. Get information about a proper diet from your health care provider, if necessary.  Regular physical exercise is one of the most important things you can do for your health. Most adults should get at least 150 minutes of moderate-intensity exercise (any activity that increases your heart rate and causes you to sweat) each week. In addition, most adults need muscle-strengthening exercises on 2 or more  days a week.   Maintain a healthy weight. The body mass index (BMI) is a screening tool to identify possible weight problems. It provides an estimate of body fat based on height and weight. Your health care provider can find your BMI and can help you achieve or maintain a healthy weight. For males 20 years and older:  A BMI below 18.5 is considered underweight.  A BMI of 18.5 to 24.9 is normal.  A BMI of 25 to 29.9 is considered overweight.  A BMI of 30 and above is considered obese.  Maintain normal blood lipids and cholesterol by exercising and minimizing your intake of saturated fat. Eat a balanced diet with plenty of fruits and vegetables. Blood tests for lipids and cholesterol should begin at age 2370and be repeated every 5 years. If your lipid or cholesterol levels are high, you are over age 79 or you are at high risk for heart disease, you may need your cholesterol levels checked more frequently.Ongoing high lipid and cholesterol levels should be treated with medicines if diet and exercise are not working.  If you smoke, find out from your health care provider how to quit. If you do not use tobacco, do not start.  Lung cancer screening is recommended for adults aged 541-80years who are at high risk for  developing lung cancer because of a history of smoking. A yearly low-dose CT scan of the lungs is recommended for people who have at least a 30-pack-year history of smoking and are current smokers or have quit within the past 15 years. A pack year of smoking is smoking an average of 1 pack of cigarettes a day for 1 year (for example, a 30-pack-year history of smoking could mean smoking 1 pack a day for 30 years or 2 packs a day for 15 years). Yearly screening should continue until the smoker has stopped smoking for at least 15 years. Yearly screening should be stopped for people who develop a health problem that would prevent them from having lung cancer treatment.  If you choose to drink  alcohol, do not have more than 2 drinks per day. One drink is considered to be 12 oz (360 mL) of beer, 5 oz (150 mL) of wine, or 1.5 oz (45 mL) of liquor.  Avoid the use of street drugs. Do not share needles with anyone. Ask for help if you need support or instructions about stopping the use of drugs.  High blood pressure causes heart disease and increases the risk of stroke. Blood pressure should be checked at least every 1-2 years. Ongoing high blood pressure should be treated with medicines if weight loss and exercise are not effective.  If you are 35-33 years old, ask your health care provider if you should take aspirin to prevent heart disease.  Diabetes screening involves taking a blood sample to check your fasting blood sugar level. This should be done once every 3 years after age 84 if you are at a normal weight and without risk factors for diabetes. Testing should be considered at a younger age or be carried out more frequently if you are overweight and have at least 1 risk factor for diabetes.  Colorectal cancer can be detected and often prevented. Most routine colorectal cancer screening begins at the age of 8 and continues through age 25. However, your health care provider may recommend screening at an earlier age if you have risk factors for colon cancer. On a yearly basis, your health care provider may provide home test kits to check for hidden blood in the stool. A small camera at the end of a tube may be used to directly examine the colon (sigmoidoscopy or colonoscopy) to detect the earliest forms of colorectal cancer. Talk to your health care provider about this at age 60 when routine screening begins. A direct exam of the colon should be repeated every 5-10 years through age 43, unless early forms of precancerous polyps or small growths are found.  People who are at an increased risk for hepatitis B should be screened for this virus. You are considered at high risk for hepatitis B  if:  You were born in a country where hepatitis B occurs often. Talk with your health care provider about which countries are considered high risk.  Your parents were born in a high-risk country and you have not received a shot to protect against hepatitis B (hepatitis B vaccine).  You have HIV or AIDS.  You use needles to inject street drugs.  You live with, or have sex with, someone who has hepatitis B.  You are a man who has sex with other men (MSM).  You get hemodialysis treatment.  You take certain medicines for conditions like cancer, organ transplantation, and autoimmune conditions.  Hepatitis C blood testing is recommended for all people born  from [place] through 1963 and any individual with known risk factors for hepatitis C.  Healthy men should no longer receive prostate-specific antigen (PSA) blood tests as part of routine cancer screening. Talk to your health care provider about prostate cancer screening.  Testicular cancer screening is not recommended for adolescents or adult males who have no symptoms. Screening includes self-exam, a health care provider exam, and other screening tests. Consult with your health care provider about any symptoms you have or any concerns you have about testicular cancer.  Practice safe sex. Use condoms and avoid high-risk sexual practices to reduce the spread of sexually transmitted infections (STIs).  You should be screened for STIs, including gonorrhea and chlamydia if:  You are sexually active and are younger than 24 years.  You are older than 24 years, and your health care provider tells you that you are at risk for this type of infection.  Your sexual activity has changed since you were last screened, and you are at an increased risk for chlamydia or gonorrhea. Ask your health care provider if you are at risk.  If you are at risk of being infected with HIV, it is recommended that you take a prescription medicine daily to prevent HIV  infection. This is called pre-exposure prophylaxis (PrEP). You are considered at risk if:  You are a man who has sex with other men (MSM).  You are a heterosexual man who is sexually active with multiple partners.  You take drugs by injection.  You are sexually active with a partner who has HIV.  Talk with your health care provider about whether you are at high risk of being infected with HIV. If you choose to begin PrEP, you should first be tested for HIV. You should then be tested every 3 months for as long as you are taking PrEP.  Use sunscreen. Apply sunscreen liberally and repeatedly throughout the day. You should seek shade when your shadow is shorter than you. Protect yourself by wearing long sleeves, pants, a wide-brimmed hat, and sunglasses year round whenever you are outdoors.  Tell your health care provider of new moles or changes in moles, especially if there is a change in shape or color. Also, tell your health care provider if a mole is larger than the size of a pencil eraser.  A one-time screening for abdominal aortic aneurysm (AAA) and surgical repair of large AAAs by ultrasound is recommended for men aged 67-75 years who are current or former smokers.  Stay current with your vaccines (immunizations). Document Released: 05/31/2008 Document Revised: 12/08/2013 Document Reviewed: 04/30/2011 [facility] Patient Information 2015 [place], [place]. This information is not intended to replace advice given to you by your health care provider. Make sure you discuss any questions you have with your health care provider.

## 2015-09-01 NOTE — Progress Notes (Signed)
Patient ID: Dalton Vaughan, male   DOB: 1929-03-13, 81 y.o.   MRN: [id number]   Subjective:   Dalton Vaughan is a 81 y.o. white male who presents for an Initial Medicare Annual Wellness Visit and also is in need for recheck PT/INR.   Dalton Vaughan has retired and lives with his wife in [place], [place]. His wife has multiple mylonoma and he is her main caregiver.   Dalton Vaughan main concern is pain that he is having in his sacral / rectal area and that radiated down his leg. He took 1 dose of meloxicam which was prescribed by urologist with good results but this was stopped due to history of GI bleed and naprosyn EC started. He did not see much relief with naprosyn EC but did see improvement with meloxicam.       Current Medications (verified) Outpatient Encounter Prescriptions as of 09/01/2015  Medication Sig  . amLODipine (NORVASC) 5 MG tablet TAKE ONE-HALF (1/2) TABLET TWICE A DAY  . azelastine (ASTELIN) 137 MCG/SPRAY nasal spray USE 1 TO 2 SPRAYS IN EACH NOSTRIL TWICE A DAY  . azithromycin (ZITHROMAX Z-PAK) 250 MG tablet As directed  . Calcium Carb-Cholecalciferol (CALCIUM + D3) 600-200 MG-UNIT TABS Take 0.25 tablets by mouth daily.  Marland Kitchen Co-Enzyme Q10 200 MG CAPS Take 1 tablet by mouth daily.  Marland Kitchen doxazosin (CARDURA) 8 MG tablet Take 60m by mouth daily  . fluticasone (FLONASE) 50 MCG/ACT nasal spray USE 1 TO 2 SPRAYS IN EACH NOSTRIL DAILY  . levothyroxine (SYNTHROID, LEVOTHROID) 50 MCG tablet Take 1 tablet (50 mcg total) by mouth daily.  . mirtazapine (REMERON) 15 MG tablet Take 0.5 tablets (7.5 mg total) by mouth at bedtime. TAKE 1 TABLET DAILY; Pt takes 7.5 mg  . Multiple Vitamin (MULTIVITAMIN) tablet Take 1 tablet by mouth daily.    . ramipril (ALTACE) 10 MG capsule TAKE 1 CAPSULE DAILY  . rosuvastatin (CRESTOR) 10 MG tablet Take 0.5 tablets (5 mg total) by mouth every Monday, Wednesday, and Friday.  . triamterene-hydrochlorothiazide (MAXZIDE-25) 37.5-25 MG per tablet Take 1 tablet by mouth daily.  .  vitamin C (ASCORBIC ACID) 500 MG tablet Take 500 mg by mouth daily. Take 1/4 tablet by mouth daily.  .Marland Kitchenwarfarin (COUMADIN) 5 MG tablet TAKE AS DIRECTED (Patient taking differently: TAKE AS DIRECTED; Pt takes 2.5 mg on Tues, Thurs, Sat & Sun; Takes 5 mg on Mon, Wed, Fri)  . [DISCONTINUED] doxazosin (CARDURA) 8 MG tablet Take 1 tablet (8 mg total) by mouth at bedtime. (Patient taking differently: Take 6 mg by mouth at bedtime. Pt takes 6 mg)  . [DISCONTINUED] mirtazapine (REMERON) 15 MG tablet TAKE 1 TABLET DAILY (Patient taking differently: TAKE 1 TABLET DAILY; Pt takes 7.5 mg)  . Cholecalciferol (VITAMIN D-3) 5000 UNITS TABS Take 1 tablet by mouth daily.   . diclofenac sodium (VOLTAREN) 1 % GEL Apply 2 g topically 4 (four) times daily as needed.  . [DISCONTINUED] Coenzyme Q10 (CO Q 10) 100 MG CAPS Take 1 capsule by mouth daily.  . [DISCONTINUED] folic acid (FOLVITE) 1 MG tablet Take 1 tablet (1 mg total) by mouth daily.   No facility-administered encounter medications on file as of 09/01/2015.   Patient is taking mirtazapine which is a high risk med.  He has been taking mirtazapine since around 2004 when he was diagnosed with severe depression.  He states that he tried several other medications for depression prior to starting mirtazepine.  He has decreased his dose from 164mdaily to 7.51m87m  daily about 2 years ago.  He has also tried going off mirtazepine and "felt terrible".    Allergies (verified) Paxil; Eliquis; Penicillins; Pravachol; Zetia; and Vytorin   History: Past Medical History  Diagnosis Date  . Hyperlipidemia   . Diverticulosis   . GERD (gastroesophageal reflux disease)   . Atrial fibrillation   . Vitreous detachment   . Internal hemorrhoids   . CAD (coronary artery disease)     Dr. Wynonia Lawman  . Hypertensive heart disease without CHF   . History of TIAs   . Lumbar disc disease   . BPH (benign prostatic hypertrophy)   . Oral bleeding 08/20/2012    Following molar tooth  extraction July, 2013  . Vitamin D deficiency   . Hypothyroidism   . Anal fissure   . Depression 2004  . Hypertension   . Thyroid disease   . Ruptured disk 1995  . Cataract    Past Surgical History  Procedure Laterality Date  . Bilateral cataract surgery  6/07  . Cysloscopy  8/08  . Prostate biopsy  8/08  . Coronary artery bypass graft  12/99     Dr. Servando Snare   . Tonsillectomy and adenoidectomy    . Hernia repair  1997    right  . Eye surgery    . Herninated disc  1995   Family History  Problem Relation Age of Onset  . Heart disease Paternal Aunt   . Heart disease Paternal Uncle   . Colon cancer Neg Hx   . Colon polyps Neg Hx   . Kidney disease Neg Hx   . Esophageal cancer Neg Hx   . Gallbladder disease Neg Hx   . Diabetes Neg Hx   . Cancer Brother     prostate  . Asthma Sister    Social History   Occupational History  . Retired    Social History Main Topics  . Smoking status: Former Smoker    Types: Cigarettes    Start date: 12/17/1948    Quit date: 12/17/1966  . Smokeless tobacco: Never Used  . Alcohol Use: 0.6 oz/week    1 Standard drinks or equivalent per week     Comment: Occassionally  . Drug Use: No  . Sexual Activity: No    Do you feel safe at home?  Yes  Dietary issues and exercise activities discussed: Current Exercise Habits:: The patient does not participate in regular exercise at present  Current Dietary habits:   Tried to eat low fat and low salt diet    Cardiac Risk Factors include: advanced age (>13mn, >>55women);dyslipidemia;male gender;hypertension;sedentary lifestyle  Objective:    Today's Vitals   09/01/15 1453  BP: 134/82  Pulse: 69  Height: 5' 10"  (1.778 m)  Weight: 181 lb (82.101 kg)  PainSc: 2   PainLoc: Back   Body mass index is 25.97 kg/(m^2).   INR was 1.5 today   Activities of Daily Living In your present state of health, do you have any difficulty performing the following activities: 09/01/2015  Hearing? N   Vision? N  Difficulty concentrating or making decisions? N  Walking or climbing stairs? Y  Dressing or bathing? N  Doing errands, shopping? N  Preparing Food and eating ? N  Using the Toilet? N  In the past six months, have you accidently leaked urine? N  Do you have problems with loss of bowel control? N  Managing your Medications? N  Managing your Finances? N  Housekeeping or managing your Housekeeping?  N   Are there smokers in your home (other than you)? No    Depression Screen PHQ 2/9 Scores 09/01/2015 05/24/2015 01/10/2015 05/06/2014  PHQ - 2 Score 1 0 0 0    Fall Risk Fall Risk  09/01/2015 05/24/2015 01/10/2015 05/06/2014 08/03/2013  Falls in the past year? No No No No No    Cognitive Function: MMSE - Mini Mental State Exam 09/01/2015  Orientation to time 5  Orientation to Place 5  Registration 3  Attention/ Calculation 5  Recall 3  Language- name 2 objects 2  Language- repeat 1  Language- follow 3 step command 3  Language- read & follow direction 1  Write a sentence 1  Copy design 1  Total score 30    Immunizations and Health Maintenance Immunization History  Administered Date(s) Administered  . Influenza,inj,Quad PF,36+ Mos 09/15/2013, 09/22/2014  . Pneumococcal Conjugate-13 01/04/2014  . Pneumococcal Polysaccharide-23 12/17/1996   Health Maintenance Due  Topic Date Due  . INFLUENZA VACCINE  07/18/2015    Patient Care Team: Chipper Herb, MD as PCP - General (Family Medicine) Jacolyn Reedy, MD as Consulting Physician (Cardiology) Deboraha Sprang, MD (Cardiology) Raynelle Bring, MD as Consulting Physician (Urology) Jerene Bears, MD as Consulting Physician (Gastroenterology) Clent Jacks, MD as Consulting Physician (Ophthalmology)  Indicate any recent Medical Services you may have received from other than [facility] providers in the past year (date may be approximate).    Assessment:    Annual Wellness Visit  High risk Medication Therapeutic Anticoagulation  (patients goal is 1.5 to 2.0) Pain    Screening Tests Health Maintenance  Topic Date Due  . INFLUENZA VACCINE  07/18/2015  . PNA vac Low Risk Adult (2 of 2 - PPSV23) 11/23/2015 (Originally 01/04/2015)  . COLONOSCOPY  12/18/2015  . TETANUS/TDAP  12/18/2015  . ZOSTAVAX  Completed        Plan:   During the course of the visit Reggie was educated and counseled about the following appropriate screening and preventive services:   Vaccines to include Pneumoccal, Influenza, Hepatitis B, Td, Zostavax - UTD  Patient has appt with PCP to follow up pain.  I discussed vairous options for neuropathic pain - gabapentin, cymbalta or amatryptyline.   Rx for voltaren gel apply 2 grams up to qid.    Colorectal cancer screening - UTD  Cardiovascular disease screening - UTD  Glaucoma screening /  Eye Exam - UTD  Increase physical activity recommended - start with 5 to 10 minutes daily and increase as able to 30 minutes  Prostate cancer screening - UTD  Advanced Directives - UTD  Anticoagulation Dose Instructions as of 09/01/2015      [name] Tue Wed Thu Fri Sat   New Dose 2.5 mg 5 mg 2.5 mg 5 mg 2.5 mg 5 mg 2.5 mg    Description        Continue current warfarin dose of 66m 1 tablet mondays, wednesdays and fridays and 1/2 tablet all other days. Goal INR is 1.5 to 2.0.         Patient Instructions (the written plan) were given to the patient.   ECherre Robins PVa [facility]  09/01/2015

## 2015-09-13 ENCOUNTER — Encounter: Payer: Self-pay | Admitting: Pharmacist Clinician (PhC)/ Clinical Pharmacy Specialist

## 2015-09-30 DIAGNOSIS — E785 Hyperlipidemia, unspecified: Secondary | ICD-10-CM | POA: Diagnosis not present

## 2015-09-30 DIAGNOSIS — Z7901 Long term (current) use of anticoagulants: Secondary | ICD-10-CM | POA: Diagnosis not present

## 2015-09-30 DIAGNOSIS — Z8673 Personal history of transient ischemic attack (TIA), and cerebral infarction without residual deficits: Secondary | ICD-10-CM | POA: Diagnosis not present

## 2015-09-30 DIAGNOSIS — Z951 Presence of aortocoronary bypass graft: Secondary | ICD-10-CM | POA: Diagnosis not present

## 2015-09-30 DIAGNOSIS — I495 Sick sinus syndrome: Secondary | ICD-10-CM | POA: Diagnosis not present

## 2015-09-30 DIAGNOSIS — I119 Hypertensive heart disease without heart failure: Secondary | ICD-10-CM | POA: Diagnosis not present

## 2015-09-30 DIAGNOSIS — I2510 Atherosclerotic heart disease of native coronary artery without angina pectoris: Secondary | ICD-10-CM | POA: Diagnosis not present

## 2015-09-30 DIAGNOSIS — I482 Chronic atrial fibrillation: Secondary | ICD-10-CM | POA: Diagnosis not present

## 2015-10-04 ENCOUNTER — Ambulatory Visit (INDEPENDENT_AMBULATORY_CARE_PROVIDER_SITE_OTHER): Payer: Medicare Other | Admitting: Family Medicine

## 2015-10-04 ENCOUNTER — Encounter: Payer: Self-pay | Admitting: Family Medicine

## 2015-10-04 VITALS — BP 151/87 | HR 76 | Temp 97.0°F | Ht 70.0 in | Wt 183.0 lb

## 2015-10-04 DIAGNOSIS — E785 Hyperlipidemia, unspecified: Secondary | ICD-10-CM

## 2015-10-04 DIAGNOSIS — M545 Low back pain: Secondary | ICD-10-CM

## 2015-10-04 DIAGNOSIS — G629 Polyneuropathy, unspecified: Secondary | ICD-10-CM | POA: Insufficient documentation

## 2015-10-04 DIAGNOSIS — N400 Enlarged prostate without lower urinary tract symptoms: Secondary | ICD-10-CM

## 2015-10-04 DIAGNOSIS — E039 Hypothyroidism, unspecified: Secondary | ICD-10-CM | POA: Diagnosis not present

## 2015-10-04 DIAGNOSIS — G589 Mononeuropathy, unspecified: Secondary | ICD-10-CM | POA: Insufficient documentation

## 2015-10-04 DIAGNOSIS — E559 Vitamin D deficiency, unspecified: Secondary | ICD-10-CM

## 2015-10-04 DIAGNOSIS — G8929 Other chronic pain: Secondary | ICD-10-CM

## 2015-10-04 DIAGNOSIS — Z23 Encounter for immunization: Secondary | ICD-10-CM

## 2015-10-04 DIAGNOSIS — I119 Hypertensive heart disease without heart failure: Secondary | ICD-10-CM | POA: Diagnosis not present

## 2015-10-04 DIAGNOSIS — I482 Chronic atrial fibrillation, unspecified: Secondary | ICD-10-CM

## 2015-10-04 DIAGNOSIS — K219 Gastro-esophageal reflux disease without esophagitis: Secondary | ICD-10-CM

## 2015-10-04 DIAGNOSIS — M542 Cervicalgia: Secondary | ICD-10-CM

## 2015-10-04 LAB — POCT INR: INR: 1.4

## 2015-10-04 NOTE — Patient Instructions (Addendum)
Medicare Annual Wellness Visit  [facility] and the medical providers at [facility] strive to bring you the best medical care.  In doing so we not only want to address your current medical conditions and concerns but also to detect new conditions early and prevent illness, disease and health-related problems.    Medicare offers a yearly Wellness Visit which allows our clinical staff to assess your need for preventative services including immunizations, lifestyle education, counseling to decrease risk of preventable diseases and screening for fall risk and other medical concerns.    This visit is provided free of charge (no copay) for all Medicare recipients. The clinical pharmacists at [facility] have begun to conduct these Wellness Visits which will also include a thorough review of all your medications.    As you primary medical provider recommend that you make an appointment for your Annual Wellness Visit if you have not done so already this year.  You may set up this appointment before you leave today or you may call back ([phone]) and schedule an appointment.  Please make sure when you call that you mention that you are scheduling your Annual Wellness Visit with the clinical pharmacist so that the appointment may be made for the proper length of time.     Continue current medications. Continue good therapeutic lifestyle changes which include good diet and exercise. Fall precautions discussed with patient. If an FOBT was given today- please return it to our front desk. If you are over 26 years old - you may need Prevnar 59 or the adult Pneumonia vaccine.  **Flu shots are available--- please call and schedule a FLU-CLINIC appointment**  After your visit with [place] today you will receive a survey in the mail or online from [employer] regarding your care with [place]. Please take a moment to fill this out. Your feedback is very  important to [place] as you can help [place] better understand your patient needs as well as improve your experience and satisfaction. WE CARE ABOUT YOU!!!   The patient should continue to follow-up with the urologist and the cardiologist. He should return to the office fasting for lab work. We will arrange for him to see the orthopedic surgeon that he has seen in the past because of the neuropathy involving his left leg and foot. In the meantime he should continue to take extra strength Tylenol as needed for pain He should bring blood pressures for review in 4 weeks He should watch his sodium intake

## 2015-10-04 NOTE — Progress Notes (Signed)
Subjective:    Patient ID: Dalton Vaughan, male    DOB: October 22, 1929, 81 y.o.   MRN: [id number]  HPI Pt here for follow up and management of chronic medical problems which includes hypertension, hyperlipidemia, hypothyroid and a-fib. He is taking medications regularly. The patient sees the cardiologist regularly. He has chronic atrial fibrillation. He just recently saw the cardiologist and everything was stable from his point of view. He sees him every 6 months. He is due today to get his flu shot. He is followed regularly by the urologist for his rectal exams. He will return to the office fasting to get his blood work. He does today complain of some leg pain and low back pain and neck pain. He will get his pro time today. The patient denies chest pain or shortness of breath. He does have occasional heartburn and this is most likely related to his GERD and it is not occurring any more frequently than usual. He denies blood in the stool or black tarry bowel movements and is passing his water without problems. He does have the neck pain and stiffness and he has the low back pain and a feeling of coldness in the left foot. This is worse when he is sitting down at night. The neck pain is worse with activity. He has seen the orthopedist in the past and has had injections in his back and hip.      Patient Active Problem List   Diagnosis Date Noted  . Allergic rhinitis 05/06/2014  . Depression 08/03/2013  . Sick sinus syndrome ([place]) 07/31/2012  . Hypertensive heart disease without CHF   . Chronic atrial fibrillation ([place])   . CAD (coronary artery disease)   . History of TIAs   . Lumbar disc disease   . GERD (gastroesophageal reflux disease)   . Long-term (current) use of anticoagulants   . BPH (benign prostatic hypertrophy)   . Hyperlipidemia    Outpatient Encounter Prescriptions as of 10/04/2015  Medication Sig  . amLODipine (NORVASC) 5 MG tablet TAKE ONE-HALF (1/2) TABLET TWICE A DAY  .  azelastine (ASTELIN) 137 MCG/SPRAY nasal spray USE 1 TO 2 SPRAYS IN EACH NOSTRIL TWICE A DAY  . Calcium Carb-Cholecalciferol (CALCIUM + D3) 600-200 MG-UNIT TABS Take 0.25 tablets by mouth daily.  . Cholecalciferol (VITAMIN D-3) 5000 UNITS TABS Take 1 tablet by mouth daily.   Marland Kitchen Co-Enzyme Q10 200 MG CAPS Take 1 tablet by mouth daily.  Marland Kitchen doxazosin (CARDURA) 8 MG tablet Take 57m by mouth daily  . fluticasone (FLONASE) 50 MCG/ACT nasal spray USE 1 TO 2 SPRAYS IN EACH NOSTRIL DAILY  . levothyroxine (SYNTHROID, LEVOTHROID) 50 MCG tablet Take 1 tablet (50 mcg total) by mouth daily.  . mirtazapine (REMERON) 15 MG tablet Take 0.5 tablets (7.5 mg total) by mouth at bedtime. TAKE 1 TABLET DAILY; Pt takes 7.5 mg  . Multiple Vitamin (MULTIVITAMIN) tablet Take 1 tablet by mouth daily.    . ramipril (ALTACE) 10 MG capsule TAKE 1 CAPSULE DAILY  . rosuvastatin (CRESTOR) 10 MG tablet Take 0.5 tablets (5 mg total) by mouth every Monday, Wednesday, and Friday.  . triamterene-hydrochlorothiazide (MAXZIDE-25) 37.5-25 MG per tablet Take 1 tablet by mouth daily.  . vitamin C (ASCORBIC ACID) 500 MG tablet Take 500 mg by mouth daily. Take 1/4 tablet by mouth daily.  .Marland Kitchenwarfarin (COUMADIN) 5 MG tablet TAKE AS DIRECTED (Patient taking differently: TAKE AS DIRECTED; Pt takes 2.5 mg on Tues, Thurs, Sat & Sun; Takes 5 mg  on Mon, Wed, Fri)  . diclofenac sodium (VOLTAREN) 1 % GEL Apply 2 g topically 4 (four) times daily as needed. (Patient not taking: Reported on 10/04/2015)  . [DISCONTINUED] azithromycin (ZITHROMAX Z-PAK) 250 MG tablet As directed  . [DISCONTINUED] Coenzyme Q10 (CO Q 10) 100 MG CAPS Take 1 capsule by mouth daily.  . [DISCONTINUED] doxazosin (CARDURA) 8 MG tablet Take 1 tablet (8 mg total) by mouth at bedtime. (Patient taking differently: Take 6 mg by mouth at bedtime. Pt takes 6 mg)  . [DISCONTINUED] folic acid (FOLVITE) 1 MG tablet Take 1 tablet (1 mg total) by mouth daily.  . [DISCONTINUED] meloxicam (MOBIC) 15  MG tablet Take 15 mg by mouth daily.  . [DISCONTINUED] mirtazapine (REMERON) 15 MG tablet TAKE 1 TABLET DAILY (Patient taking differently: TAKE 1 TABLET DAILY; Pt takes 7.5 mg)  . [DISCONTINUED] Naproxen 375 MG TBEC Take 1 tablet (375 mg total) by mouth 2 (two) times daily. (Patient not taking: Reported on 08/30/2015)  . [DISCONTINUED] ramipril (ALTACE) 10 MG capsule TAKE 1 CAPSULE DAILY  . [DISCONTINUED] rosuvastatin (CRESTOR) 10 MG tablet Take 1 tablet (10 mg total) by mouth at bedtime. (Patient taking differently: Take 10 mg by mouth at bedtime. Pt takes 5 mg)   No facility-administered encounter medications on file as of 10/04/2015.      Review of Systems  Constitutional: Negative.   HENT: Negative.   Eyes: Negative.   Respiratory: Negative.   Cardiovascular: Negative.   Gastrointestinal: Negative.   Endocrine: Negative.   Genitourinary: Negative.   Musculoskeletal: Positive for back pain (pain into left leg) and neck pain.  Skin: Negative.   Allergic/Immunologic: Negative.   Neurological: Negative.   Hematological: Negative.   Psychiatric/Behavioral: Negative.        Objective:   Physical Exam  Constitutional: He is oriented to person, place, and time. He appears well-developed and well-nourished. No distress.  HENT:  Head: Normocephalic and atraumatic.  Right Ear: External ear normal.  Left Ear: External ear normal.  Nose: Nose normal.  Mouth/Throat: Oropharynx is clear and moist. No oropharyngeal exudate.  Eyes: Conjunctivae and EOM are normal. Pupils are equal, round, and reactive to light. Right eye exhibits no discharge. Left eye exhibits no discharge. No scleral icterus.  Neck: Normal range of motion. Neck supple. No thyromegaly present.  Neck without bruits thyromegaly or anterior cervical adenopathy  Cardiovascular: Normal rate and intact distal pulses.   No murmur heard. The heart remains irregular irregular at 84/m  Pulmonary/Chest: Effort normal and breath  sounds normal. No respiratory distress. He has no wheezes. He has no rales. He exhibits no tenderness.  Clear anteriorly and posteriorly without axillary adenopathy  Abdominal: Soft. Bowel sounds are normal. He exhibits no mass. There is no tenderness. There is no rebound and no guarding.  There is no abdominal tenderness or organ enlargement or masses and there was no inguinal adenopathy.  Genitourinary:  Patient sees the urologist regularly.  Musculoskeletal: Normal range of motion. He exhibits no edema or tenderness.  Lymphadenopathy:    He has no cervical adenopathy.  Neurological: He is alert and oriented to person, place, and time. He has normal reflexes. No cranial nerve deficit.  Skin: Skin is warm and dry. No rash noted.  Psychiatric: He has a normal mood and affect. His behavior is normal. Judgment and thought content normal.  Nursing note and vitals reviewed.   BP 151/87 mmHg  Pulse 76  Temp(Src) 97 F (36.1 C) (Oral)  Ht 5' 10"  (  1.778 m)  Wt 183 lb (83.008 kg)  BMI 26.26 kg/m2 The blood pressure was rechecked and was 164/82 in the right arm with a large cuff with the patient sitting.      Assessment & Plan:  1. Chronic atrial fibrillation (HCC) -Patient should continue with his Coumadin and should continue to follow up with the cardiologist as planned - CBC with Differential/Platelet; Future - POCT INR  2. BPH (benign prostatic hypertrophy) -The patient will continue to follow-up with urology - CBC with Differential/Platelet; Future  3. Hyperlipidemia -He should continue his current treatment pending results of lab work - CBC with Differential/Platelet; Future - NMR, lipoprofile; Future  4. Gastroesophageal reflux disease, esophagitis presence not specified -He should continue to take Zantac as needed - CBC with Differential/Platelet; Future  5. hypertension -Blood pressure was elevated today even when it was repeated. The patient will bring [place] some readings  for review in about 4 weeks. He just saw the cardiologist and according to the patient the cardiologist did not say that there was a problem with the blood pressure at that time. - BMP8+EGFR; Future - Hepatic function panel; Future - CBC with Differential/Platelet; Future  6. Vitamin D deficiency -The patient will continue with his current vitamin D replacement pending results of lab work - CBC with Differential/Platelet; Future - Vit D  25 hydroxy (rtn osteoporosis monitoring); Future  7. Hypothyroidism, unspecified hypothyroidism type -He will continue with thyroid replacement pending results of lab work - CBC with Differential/Platelet; Future - Thyroid Panel With TSH; Future  8. Mononeuropathy -We will make a referral back to the orthopedist for possible injections in hip or back  9. Neck pain, chronic -Take Tylenol for this problem as needed.  No orders of the defined types were placed in this encounter.   Patient Instructions                       Medicare Annual Wellness Visit  [facility] and the medical providers at [facility] strive to bring you the best medical care.  In doing so we not only want to address your current medical conditions and concerns but also to detect new conditions early and prevent illness, disease and health-related problems.    Medicare offers a yearly Wellness Visit which allows our clinical staff to assess your need for preventative services including immunizations, lifestyle education, counseling to decrease risk of preventable diseases and screening for fall risk and other medical concerns.    This visit is provided free of charge (no copay) for all Medicare recipients. The clinical pharmacists at [facility] have begun to conduct these Wellness Visits which will also include a thorough review of all your medications.    As you primary medical provider recommend that you make an appointment for your  Annual Wellness Visit if you have not done so already this year.  You may set up this appointment before you leave today or you may call back ([phone]) and schedule an appointment.  Please make sure when you call that you mention that you are scheduling your Annual Wellness Visit with the clinical pharmacist so that the appointment may be made for the proper length of time.     Continue current medications. Continue good therapeutic lifestyle changes which include good diet and exercise. Fall precautions discussed with patient. If an FOBT was given today- please return it to our front desk. If you are over 73 years old -  you may need Prevnar 13 or the adult Pneumonia vaccine.  **Flu shots are available--- please call and schedule a FLU-CLINIC appointment**  After your visit with [place] today you will receive a survey in the mail or online from [employer] regarding your care with [place]. Please take a moment to fill this out. Your feedback is very important to [place] as you can help [place] better understand your patient needs as well as improve your experience and satisfaction. WE CARE ABOUT YOU!!!   The patient should continue to follow-up with the urologist and the cardiologist. He should return to the office fasting for lab work. We will arrange for him to see the orthopedic surgeon that he has seen in the past because of the neuropathy involving his left leg and foot. In the meantime he should continue to take extra strength Tylenol as needed for pain He should bring blood pressures for review in 4 weeks He should watch his sodium intake   Arrie Senate MD

## 2015-10-04 NOTE — Addendum Note (Signed)
Addended by: Zannie Cove on: 10/04/2015 09:57 AM   Modules accepted: Orders

## 2015-10-07 DIAGNOSIS — R200 Anesthesia of skin: Secondary | ICD-10-CM | POA: Diagnosis not present

## 2015-10-07 DIAGNOSIS — M542 Cervicalgia: Secondary | ICD-10-CM | POA: Diagnosis not present

## 2015-10-10 DIAGNOSIS — R361 Hematospermia: Secondary | ICD-10-CM | POA: Diagnosis not present

## 2015-10-13 ENCOUNTER — Ambulatory Visit (INDEPENDENT_AMBULATORY_CARE_PROVIDER_SITE_OTHER): Payer: Medicare Other | Admitting: Neurology

## 2015-10-13 ENCOUNTER — Encounter: Payer: Self-pay | Admitting: *Deleted

## 2015-10-13 ENCOUNTER — Encounter: Payer: Self-pay | Admitting: Neurology

## 2015-10-13 VITALS — BP 177/96 | HR 60 | Ht 70.0 in | Wt 181.6 lb

## 2015-10-13 DIAGNOSIS — E519 Thiamine deficiency, unspecified: Secondary | ICD-10-CM

## 2015-10-13 DIAGNOSIS — B1920 Unspecified viral hepatitis C without hepatic coma: Secondary | ICD-10-CM

## 2015-10-13 DIAGNOSIS — E538 Deficiency of other specified B group vitamins: Secondary | ICD-10-CM | POA: Diagnosis not present

## 2015-10-13 DIAGNOSIS — G609 Hereditary and idiopathic neuropathy, unspecified: Secondary | ICD-10-CM

## 2015-10-13 DIAGNOSIS — E531 Pyridoxine deficiency: Secondary | ICD-10-CM

## 2015-10-13 DIAGNOSIS — Z79899 Other long term (current) drug therapy: Secondary | ICD-10-CM | POA: Diagnosis not present

## 2015-10-13 MED ORDER — GABAPENTIN 300 MG PO CAPS: 600.0000 mg | ORAL_CAPSULE | Freq: Every day | ORAL | Status: DC

## 2015-10-13 NOTE — Patient Instructions (Signed)
Overall you are doing fairly well but I do want to suggest a few things today:   Remember to drink plenty of fluid, eat healthy meals and do not skip any meals. Try to eat protein with a every meal and eat a healthy snack such as fruit or nuts in between meals. Try to keep a regular sleep-wake schedule and try to exercise daily, particularly in the form of walking, 20-30 minutes a day, if you can.   As far as your medications are concerned, I would like to suggest: neurontin 1-2 caps at bedtime  As far as diagnostic testing: labs  I would like to see you back in 4 months, sooner if we need to. Please call us with any interim questions, concerns, problems, updates or refill requests.   Please also call us for any test results so we can go over those with you on the phone.  My clinical assistant and will answer any of your questions and relay your messages to me and also relay most of my messages to you.   Our phone number is [phone]. We also have an after hours call service for urgent matters and there is a physician on-call for urgent questions. For any emergencies you know to call 911 or go to the nearest emergency room

## 2015-10-13 NOTE — Progress Notes (Signed)
[facility]    Provider:  Dr Jaynee Eagles Referring Provider: Chipper Herb, MD Primary Care Physician:  Redge Gainer, MD  CC:  Bilateral leg numbness  HPI:  Dalton Vaughan is a 81 y.o. male here as a referral from Dr. Laurance Flatten for numbness in the legs. He has a past medical history of hypertension, heart disease, atrial fibrillation on chronic Coumadin, heart bypass surgery, lumbar surgery for herniated disc, hyperlipidemia, GERD, hypothyroidism, chronic neck and low back pain.  He has had this numbness since for years. The numbness in the feet actually started even earlier than the 70s maybe in the 60s. Started out in the right leg below the knee medially. Then later on he started getting the numbness in his feet especially worse in the evening. Feet get cold and numb. The bottom of the feet are affected. He has burning on the bottom of the feet. Feels like ice. Left is worse than the right. Worse at night and can't get comfortable and feels like he has to move his legs. He has pain in the left hip and groin area with soreness to touch. He has pain in the neck for several months. His feet bother him most at night. He takes remeron at night and that seems to exacerbate the symptoms. Keeps him awake most of the night. If he can go to sleep he sleeps well. Better during the day when moving around. Symptoms are getting worse. When he was younger he never drank alcohol excessively, he occasionally drinks a glass of wine now, not too often. He was in the [employer] and he has been to lots of places, been exposed to [employer]. He feels like he drags his feet, when he is walking he is not as steady. Balance is affected. He has pain in his hips and has diffculty sometimes. No recent falls. No FHx of neuromuscular disorders or neuropathy. Feels his neuropathy may be related to Agent Orange exposure.   Reviewed notes, labs and imaging from outside physicians, which showed: Patient has  chronic left-sided pelvic pain with no significant resolution. He has been seen by GI specialist. He had a CT scan of the pelvis which did not show any etiology. He has completed physical therapy. He is on chronic Coumadin use. He has neck pain, mostly likely degenerative disc disease. No radicular symptoms noted. Primary care was concerned with the symptoms patient was having in his feet and lower limbs, concern whether this could be a peripheral neuropathic process and referred him to neurology.   MRI pelvis 05/2015: IMPRESSION: No evidence of rectal/pelvic floor prolapse.  No evidence of perirectal fluid collection/abscess.  Sigmoid diverticulosis.  Low T2 signal in the left posterior apex of the peripheral gland of the prostate, nonspecific, possibly reflecting prostatitis although prostate cancer is not excluded. Correlate with PSA.  MRi of the brain (personally reviewed images and agree with the following):  1. No acute intracranial abnormality. 2. Chronic small vessel ischemic disease including involvement of the thalami.  BMP 02/2015 unremarkable TSH 02/2015 wnl LFTs 05/2015 nml   Review of Systems: Patient complains of symptoms per HPI as well as the following symptoms: Joint pain, aching muscles, cough, easy bruising, easy bleeding, numbness, restless legs. Pertinent negatives per HPI. All others negative.   Social History   Social History  . Marital Status: Married    Spouse Name: [name]   . Number of Children: 1  . Years of Education: 12   Occupational History  .  Retired    Social History Main Topics  . Smoking status: Former Smoker    Types: Cigarettes    Start date: 12/17/1948    Quit date: 12/17/1966  . Smokeless tobacco: Never Used  . Alcohol Use: 0.6 oz/week    1 Standard drinks or equivalent per week     Comment: Occassionally  . Drug Use: No  . Sexual Activity: No   Other Topics Concern  . Not on file   Social History Narrative   Married.   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 Heart disease Paternal Aunt   . Heart disease Paternal Uncle   . Colon cancer Neg Hx   . Colon polyps Neg Hx   . Kidney disease Neg Hx   . Esophageal cancer Neg Hx   . Gallbladder disease Neg Hx   . Diabetes Neg Hx   . Cancer Brother     prostate  . Asthma Sister   . Hypertension Maternal Grandmother   . COPD Sister   . CVA Maternal Grandmother   . CVA Paternal Grandmother     Past Medical History  Diagnosis Date  . Hyperlipidemia   . Diverticulosis   . GERD (gastroesophageal reflux disease)   . Atrial fibrillation ([place])   . Vitreous detachment   . Internal hemorrhoids   . CAD (coronary artery disease)     Dr. Wynonia Lawman  . Hypertensive heart disease without CHF   . History of TIAs   . Lumbar disc disease   . BPH (benign prostatic hypertrophy)   . Oral bleeding 08/20/2012    Following molar tooth extraction July, 2013  . Vitamin D deficiency   . Hypothyroidism   . Anal fissure   . Depression 2004  . Hypertension   . Thyroid disease   . Ruptured disk 1995  . Cataract     Past Surgical History  Procedure Laterality Date  . Bilateral cataract surgery  6/07  . Cysloscopy  8/08  . Prostate biopsy  8/08  . Coronary artery bypass graft  12/99     Dr. Servando Snare   . Tonsillectomy and adenoidectomy    . Hernia repair  1997    right  . Eye surgery    . Herninated disc  1995    Current Outpatient Prescriptions  Medication Sig Dispense Refill  . amLODipine (NORVASC) 5 MG tablet TAKE ONE-HALF (1/2) TABLET TWICE A DAY 90 tablet 1  . azelastine (ASTELIN) 137 MCG/SPRAY nasal spray USE 1 TO 2 SPRAYS IN EACH NOSTRIL TWICE A DAY 3 mL 4  . Calcium Carb-Cholecalciferol (CALCIUM + D3) 600-200 MG-UNIT TABS Take 0.25 tablets by mouth daily.    . Cholecalciferol (VITAMIN D-3) 5000 UNITS TABS Take 1 tablet by mouth daily.     Marland Kitchen Co-Enzyme  Q10 200 MG CAPS Take 1 tablet by mouth daily.    Marland Kitchen doxazosin (CARDURA) 8 MG tablet Take 27m by mouth daily 60 tablet 3  . fluticasone (FLONASE) 50 MCG/ACT nasal spray USE 1 TO 2 SPRAYS IN EACH NOSTRIL DAILY 16 g 5  . levothyroxine (SYNTHROID, LEVOTHROID) 50 MCG tablet Take 1 tablet (50 mcg total) by mouth daily. 90 tablet 2  . mirtazapine (REMERON) 15 MG tablet Take 0.5 tablets (7.5 mg total) by mouth at bedtime. TAKE 1 TABLET DAILY; Pt takes 7.5 mg 90 tablet 0  . Multiple Vitamin (  MULTIVITAMIN) tablet Take 1 tablet by mouth daily.      . ramipril (ALTACE) 10 MG capsule TAKE 1 CAPSULE DAILY 90 capsule 1  . rosuvastatin (CRESTOR) 10 MG tablet Take 0.5 tablets (5 mg total) by mouth every Monday, Wednesday, and Friday. 30 tablet 1  . triamterene-hydrochlorothiazide (MAXZIDE-25) 37.5-25 MG per tablet Take 1 tablet by mouth daily. 90 tablet 3  . vitamin C (ASCORBIC ACID) 500 MG tablet Take 500 mg by mouth daily. Take 1/4 tablet by mouth daily.    Marland Kitchen warfarin (COUMADIN) 5 MG tablet TAKE AS DIRECTED (Patient taking differently: TAKE AS DIRECTED; Pt takes 2.5 mg on Tues, Thurs, Sat & Sun; Takes 5 mg on Mon, Wed, Fri) 90 tablet 1   No current facility-administered medications for this visit.    Allergies as of 10/13/2015 - Review Complete 10/13/2015  Allergen Reaction Noted  . Paxil [paroxetine hcl] Palpitations 04/22/2013  . Eliquis [apixaban] Other (See Comments) 04/22/2013  . Penicillins  07/05/2012  . Pravachol [pravastatin sodium]  08/26/2012  . Zetia [ezetimibe]  08/26/2012  . Vytorin [ezetimibe-simvastatin] Other (See Comments) 04/22/2013    Vitals: BP 177/96 mmHg  Pulse 60  Ht 5' 10"  (1.778 m)  Wt 181 lb 9.6 oz (82.373 kg)  BMI 26.06 kg/m2 Last Weight:  Wt Readings from Last 1 Encounters:  10/13/15 181 lb 9.6 oz (82.373 kg)   Last Height:   Ht Readings from Last 1 Encounters:  10/13/15 5' 10"  (1.778 m)   Physical exam: Exam: Gen: NAD, conversant, well nourised, obese, well  groomed                     CV: irregular rate , no MRG. No Carotid Bruits. No peripheral edema, warm, nontender Eyes: Conjunctivae clear without exudates or hemorrhage  Neuro: Detailed Neurologic Exam  Speech:    Speech is normal; fluent and spontaneous with normal comprehension.  Cognition:    The patient is oriented to person, place, and time;     recent and remote memory intact;     language fluent;     normal attention, concentration,     fund of knowledge Cranial Nerves:    The pupils are equal, round, and reactive to light. The fundi are normal and spontaneous venous pulsations are present. Visual fields are full to finger confrontation. Extraocular movements are intact. Trigeminal sensation is intact and the muscles of mastication are normal. The face is symmetric. The palate elevates in the midline. Hearing intact. Voice is normal. Shoulder shrug is normal. The tongue has normal motion without fasciculations.   Coordination:    Normal finger to nose and heel to shin. Normal rapid alternating movements.   Gait:    Heel-toe and  are normal.   Motor Observation:    No asymmetry, no atrophy, and no involuntary movements noted. Tone:    Normal muscle tone.    Posture:    Posture is normal. normal erect    Strength:    Strength is V/V in the upper and lower limbs.      Sensation: intact to LT. Intact pin prick distally. decr temp to the ankles. 8 seconds vibration. Intact proprioception.      Reflex Exam: Absent AJs otherwise deep tendon reflexes in the upper and lower extremities are normal bilaterally.      Toes:    The toes are downgoing bilaterally.   Clonus:    Clonus is absent.    Assessment/Plan:   81 y.o. male here  as a referral from Dr. Laurance Flatten for numbness in the legs. He has a past medical history of hypertension, heart disease, atrial fibrillation on chronic Coumadin, heart bypass surgery, lumbar surgery for herniated disc, hyperlipidemia, GERD,  hypothyroidism, chronic neck and low back pain. He has mild dec. distal sensation mostly small fiber. He also describes symptoms of RLS. Will order neuropathy serum panel.    More small fiber peripheral neuropathy: labs and neurontin qhs Restless leg syndrom: neurontin qhs   Sarina Ill, MD  [facility] [address] [place], [place] [address]  Phone [phone] Fax [phone]

## 2015-10-15 ENCOUNTER — Other Ambulatory Visit: Payer: Medicare Other

## 2015-10-16 DIAGNOSIS — G609 Hereditary and idiopathic neuropathy, unspecified: Secondary | ICD-10-CM | POA: Insufficient documentation

## 2015-10-17 ENCOUNTER — Other Ambulatory Visit (INDEPENDENT_AMBULATORY_CARE_PROVIDER_SITE_OTHER): Payer: Medicare Other

## 2015-10-17 DIAGNOSIS — N400 Enlarged prostate without lower urinary tract symptoms: Secondary | ICD-10-CM

## 2015-10-17 DIAGNOSIS — E039 Hypothyroidism, unspecified: Secondary | ICD-10-CM | POA: Diagnosis not present

## 2015-10-17 DIAGNOSIS — E559 Vitamin D deficiency, unspecified: Secondary | ICD-10-CM | POA: Diagnosis not present

## 2015-10-17 DIAGNOSIS — E785 Hyperlipidemia, unspecified: Secondary | ICD-10-CM | POA: Diagnosis not present

## 2015-10-17 DIAGNOSIS — K219 Gastro-esophageal reflux disease without esophagitis: Secondary | ICD-10-CM

## 2015-10-17 DIAGNOSIS — I482 Chronic atrial fibrillation, unspecified: Secondary | ICD-10-CM

## 2015-10-17 DIAGNOSIS — I119 Hypertensive heart disease without heart failure: Secondary | ICD-10-CM

## 2015-10-17 NOTE — Progress Notes (Signed)
Lab only 

## 2015-10-17 NOTE — Addendum Note (Signed)
Addended by: Earlene Plater on: 10/17/2015 08:09 AM   Modules accepted: Orders

## 2015-10-17 NOTE — Addendum Note (Signed)
Addended by: Earlene Plater on: 10/17/2015 08:19 AM   Modules accepted: Orders

## 2015-10-18 LAB — BMP8+EGFR
BUN/Creatinine Ratio: 13 (ref 10–22)
BUN: 11 mg/dL (ref 8–27)
CO2: 30 mmol/L — ABNORMAL HIGH (ref 18–29)
Calcium: 9.1 mg/dL (ref 8.6–10.2)
Chloride: 105 mmol/L (ref 97–106)
Creatinine, Ser: 0.85 mg/dL (ref 0.76–1.27)
GFR calc Af Amer: 92 mL/min/{1.73_m2} (ref >59)
GFR calc non Af Amer: 80 mL/min/{1.73_m2} (ref >59)
Glucose: 92 mg/dL (ref 65–99)
Potassium: 4.5 mmol/L (ref 3.5–5.2)
Sodium: 145 mmol/L — ABNORMAL HIGH (ref 136–144)

## 2015-10-18 LAB — CBC WITH DIFFERENTIAL/PLATELET
Basophils Absolute: 0 10*3/uL (ref 0.0–0.2)
Basos: 0 %
EOS (ABSOLUTE): 0.2 10*3/uL (ref 0.0–0.4)
Eos: 3 %
Hematocrit: 41.8 % (ref 37.5–51.0)
Hemoglobin: 13.4 g/dL (ref 12.6–17.7)
Immature Grans (Abs): 0 10*3/uL (ref 0.0–0.1)
Immature Granulocytes: 0 %
Lymphocytes Absolute: 2.4 10*3/uL (ref 0.7–3.1)
Lymphs: 41 %
MCH: 29.9 pg (ref 26.6–33.0)
MCHC: 32.1 g/dL (ref 31.5–35.7)
MCV: 93 fL (ref 79–97)
Monocytes Absolute: 0.5 10*3/uL (ref 0.1–0.9)
Monocytes: 8 %
Neutrophils Absolute: 2.8 10*3/uL (ref 1.4–7.0)
Neutrophils: 48 %
Platelets: 193 10*3/uL (ref 150–379)
RBC: 4.48 x10E6/uL (ref 4.14–5.80)
RDW: 15 % (ref 12.3–15.4)
WBC: 5.9 10*3/uL (ref 3.4–10.8)

## 2015-10-18 LAB — HEPATIC FUNCTION PANEL
ALT: 14 IU/L (ref 0–44)
AST: 18 IU/L (ref 0–40)
Albumin: 4.1 g/dL (ref 3.5–4.7)
Alkaline Phosphatase: 87 IU/L (ref 39–117)
Bilirubin Total: 0.6 mg/dL (ref 0.0–1.2)
Bilirubin, Direct: 0.12 mg/dL (ref 0.00–0.40)
Total Protein: 6.4 g/dL (ref 6.0–8.5)

## 2015-10-18 LAB — NMR, LIPOPROFILE
Cholesterol: 208 mg/dL — ABNORMAL HIGH (ref 100–199)
HDL Cholesterol by NMR: 43 mg/dL (ref >39)
HDL Particle Number: 23.6 umol/L — ABNORMAL LOW (ref >=30.5)
LDL Particle Number: 1518 nmol/L — ABNORMAL HIGH (ref <1000)
LDL Size: 21.6 nm (ref >20.5)
LDL-C: 145 mg/dL — ABNORMAL HIGH (ref 0–99)
LP-IR Score: <25 (ref <=45)
Small LDL Particle Number: 436 nmol/L (ref <=527)
Triglycerides by NMR: 102 mg/dL (ref 0–149)

## 2015-10-18 LAB — VITAMIN D 25 HYDROXY (VIT D DEFICIENCY, FRACTURES): Vit D, 25-Hydroxy: 67.4 ng/mL (ref 30.0–100.0)

## 2015-10-18 LAB — THYROID PANEL WITH TSH
Free Thyroxine Index: 2.1 (ref 1.2–4.9)
T3 Uptake Ratio: 34 % (ref 24–39)
T4, Total: 6.1 ug/dL (ref 4.5–12.0)
TSH: 2.85 u[IU]/mL (ref 0.450–4.500)

## 2015-10-20 LAB — ANA COMPREHENSIVE PANEL
Anti JO-1: <0.2 AI (ref 0.0–0.9)
Centromere Ab Screen: <0.2 AI (ref 0.0–0.9)
Chromatin Ab SerPl-aCnc: 0.2 AI (ref 0.0–0.9)
ENA RNP Ab: 0.2 AI (ref 0.0–0.9)
ENA SM Ab Ser-aCnc: <0.2 AI (ref 0.0–0.9)
ENA SSA (RO) Ab: <0.2 AI (ref 0.0–0.9)
ENA SSB (LA) Ab: <0.2 AI (ref 0.0–0.9)
Scleroderma SCL-70: <0.2 AI (ref 0.0–0.9)
dsDNA Ab: <1 IU/mL (ref 0–9)

## 2015-10-20 LAB — HEPATITIS C ANTIBODY: Hep C Virus Ab: <0.1 s/co ratio (ref 0.0–0.9)

## 2015-10-20 LAB — COMPREHENSIVE METABOLIC PANEL
ALT: 25 IU/L (ref 0–44)
AST: 25 IU/L (ref 0–40)
Albumin/Globulin Ratio: 1.7 (ref 1.1–2.5)
Albumin: 4.2 g/dL (ref 3.5–4.7)
Alkaline Phosphatase: 92 IU/L (ref 39–117)
BUN/Creatinine Ratio: 11 (ref 10–22)
BUN: 9 mg/dL (ref 8–27)
Bilirubin Total: 0.5 mg/dL (ref 0.0–1.2)
CO2: 31 mmol/L — ABNORMAL HIGH (ref 18–29)
Calcium: 9.7 mg/dL (ref 8.6–10.2)
Chloride: 98 mmol/L (ref 97–106)
Creatinine, Ser: 0.82 mg/dL (ref 0.76–1.27)
GFR calc Af Amer: 94 mL/min/{1.73_m2} (ref >59)
GFR calc non Af Amer: 81 mL/min/{1.73_m2} (ref >59)
Globulin, Total: 2.5 g/dL (ref 1.5–4.5)
Glucose: 92 mg/dL (ref 65–99)
Potassium: 4.1 mmol/L (ref 3.5–5.2)
Sodium: 141 mmol/L (ref 136–144)
Total Protein: 6.7 g/dL (ref 6.0–8.5)

## 2015-10-20 LAB — METHYLMALONIC ACID, SERUM: Methylmalonic Acid: 156 nmol/L (ref 0–378)

## 2015-10-20 LAB — MULTIPLE MYELOMA PANEL, SERUM
Albumin SerPl Elph-Mcnc: 3.7 g/dL (ref 2.9–4.4)
Albumin/Glob SerPl: 1.3 (ref 0.7–1.7)
Alpha 1: 0.2 g/dL (ref 0.0–0.4)
Alpha2 Glob SerPl Elph-Mcnc: 0.7 g/dL (ref 0.4–1.0)
B-Globulin SerPl Elph-Mcnc: 1.1 g/dL (ref 0.7–1.3)
Gamma Glob SerPl Elph-Mcnc: 1 g/dL (ref 0.4–1.8)
Globulin, Total: 3 g/dL (ref 2.2–3.9)
IgA/Immunoglobulin A, Serum: 201 mg/dL (ref 61–437)
IgG (Immunoglobin G), Serum: 984 mg/dL (ref 700–1600)
IgM (Immunoglobulin M), Srm: 40 mg/dL (ref 15–143)

## 2015-10-20 LAB — VITAMIN B1: Thiamine: 168.2 nmol/L (ref 66.5–200.0)

## 2015-10-20 LAB — B12 AND FOLATE PANEL
Folate: >20 ng/mL (ref >3.0)
Vitamin B-12: 627 pg/mL (ref 211–946)

## 2015-10-20 LAB — ANA: ANA Titer 1: NEGATIVE

## 2015-10-20 LAB — HEMOGLOBIN A1C
Est. average glucose Bld gHb Est-mCnc: 123 mg/dL
Hgb A1c MFr Bld: 5.9 % — ABNORMAL HIGH (ref 4.8–5.6)

## 2015-10-20 LAB — HEAVY METALS, BLOOD
Arsenic: 4 ug/L (ref 2–23)
Lead, Blood: 2 ug/dL (ref 0–19)
Mercury: 1.1 ug/L (ref 0.0–14.9)

## 2015-10-20 LAB — SEDIMENTATION RATE: Sed Rate: 4 mm/hr (ref 0–30)

## 2015-10-20 LAB — RPR: RPR Ser Ql: NONREACTIVE

## 2015-10-20 LAB — RHEUMATOID FACTOR: Rhuematoid fact SerPl-aCnc: <10 IU/mL (ref 0.0–13.9)

## 2015-10-20 LAB — VITAMIN B6

## 2015-10-20 LAB — B. BURGDORFI ANTIBODIES: Lyme IgG/IgM Ab: <0.91 {ISR} (ref 0.00–0.90)

## 2015-10-24 ENCOUNTER — Telehealth: Payer: Self-pay | Admitting: *Deleted

## 2015-10-24 NOTE — Telephone Encounter (Signed)
-----   Message from Melvenia Beam, MD sent at 10/23/2015  6:44 PM EST ----- Let patient know all his labs were unremarkable exceot a slightly elevate HgbA1c of 5.9. thanks

## 2015-10-24 NOTE — Telephone Encounter (Signed)
Spoke w/ pt about unremarkable lab results except slightly elevated HbgA1c at 5.9 He can f/u with his PCP about this. He verbalized understanding.

## 2015-10-27 ENCOUNTER — Encounter: Payer: Self-pay | Admitting: *Deleted

## 2015-11-08 ENCOUNTER — Other Ambulatory Visit: Payer: Self-pay | Admitting: *Deleted

## 2015-11-08 DIAGNOSIS — E785 Hyperlipidemia, unspecified: Secondary | ICD-10-CM

## 2015-11-08 MED ORDER — WARFARIN SODIUM 5 MG PO TABS: ORAL_TABLET | ORAL | Status: DC

## 2015-11-08 MED ORDER — ROSUVASTATIN CALCIUM 5 MG PO TABS: 5.0000 mg | ORAL_TABLET | Freq: Every day | ORAL | Status: DC

## 2015-11-08 MED ORDER — MIRTAZAPINE 15 MG PO TABS: 15.0000 mg | ORAL_TABLET | Freq: Every day | ORAL | Status: DC

## 2015-11-08 MED ORDER — AMLODIPINE BESYLATE 5 MG PO TABS: ORAL_TABLET | ORAL | Status: DC

## 2015-11-15 ENCOUNTER — Ambulatory Visit (INDEPENDENT_AMBULATORY_CARE_PROVIDER_SITE_OTHER): Payer: Medicare Other | Admitting: Pharmacist Clinician (PhC)/ Clinical Pharmacy Specialist

## 2015-11-15 DIAGNOSIS — I482 Chronic atrial fibrillation, unspecified: Secondary | ICD-10-CM

## 2015-11-15 DIAGNOSIS — Z23 Encounter for immunization: Secondary | ICD-10-CM

## 2015-11-15 LAB — POCT INR: INR: 1.7

## 2015-11-15 NOTE — Patient Instructions (Signed)
Anticoagulation Dose Instructions as of 11/15/2015      Dalton Vaughan Tue Wed Thu Fri Sat   New Dose 2.5 mg 5 mg 2.5 mg 5 mg 2.5 mg 5 mg 2.5 mg    Description        Continue current warfarin dose of 50m 1 tablet mondays, wednesdays and fridays and 1/2 tablet all other days. Goal INR is 1.5 to 2.0.        INR was 1.7

## 2015-11-15 NOTE — Addendum Note (Signed)
Addended by: Louann Sjogren A on: 11/15/2015 09:29 AM   Modules accepted: Orders

## 2015-11-21 ENCOUNTER — Other Ambulatory Visit: Payer: Self-pay | Admitting: Family Medicine

## 2015-12-16 ENCOUNTER — Ambulatory Visit: Payer: Medicare Other | Admitting: Family Medicine

## 2015-12-20 ENCOUNTER — Ambulatory Visit (INDEPENDENT_AMBULATORY_CARE_PROVIDER_SITE_OTHER): Payer: Medicare Other | Admitting: Nurse Practitioner

## 2015-12-20 ENCOUNTER — Encounter: Payer: Self-pay | Admitting: Nurse Practitioner

## 2015-12-20 VITALS — BP 160/95 | HR 79 | Temp 96.8°F | Ht 70.0 in | Wt 182.4 lb

## 2015-12-20 DIAGNOSIS — J0101 Acute recurrent maxillary sinusitis: Secondary | ICD-10-CM

## 2015-12-20 MED ORDER — AZITHROMYCIN 250 MG PO TABS: ORAL_TABLET | ORAL | Status: DC

## 2015-12-20 NOTE — Progress Notes (Signed)
  Subjective:     Dalton Vaughan is a 82 y.o. male who presents for evaluation of sinus pain. Symptoms include: congestion, facial pain, nasal congestion, sinus pressure, sniffing and sore throat. Onset of symptoms was 1 week ago. Symptoms have been unchanged since that time. Past history is significant for no history of pneumonia or bronchitis. Patient is a non-smoker.  The following portions of the patient's history were reviewed and updated as appropriate: allergies, current medications, past family history, past medical history, past social history, past surgical history and problem list.  Review of Systems Pertinent items are noted in HPI.   Objective:    BP 160/95 mmHg  Pulse 79  Temp(Src) 96.8 F (36 C) (Oral)  Ht 5' 10"  (1.778 m)  Wt 182 lb 6.4 oz (82.736 kg)  BMI 26.17 kg/m2  SpO2 98% General appearance: alert and cooperative Eyes: conjunctivae/corneas clear. PERRL, EOM's intact. Fundi benign. Ears: normal TM's and external ear canals both ears Nose: clear discharge, mild congestion, sinus tenderness bilateral Throat: lips, mucosa, and tongue normal; teeth and gums normal Neck: no adenopathy, no carotid bruit, no JVD, supple, symmetrical, trachea midline and thyroid not enlarged, symmetric, no tenderness/mass/nodules Lungs: clear to auscultation bilaterally Heart: regular rate and rhythm, S1, S2 normal, no murmur, click, rub or gallop    Assessment:    Acute bacterial sinusitis.    Plan:  1. Take meds as prescribed 2. Use a cool mist humidifier especially during the winter months and when heat has been humid. 3. Use saline nose sprays frequently 4. Saline irrigations of the nose can be very helpful if done frequently.  * 4X daily for 1 week*  * Use of a nettie pot can be helpful with this. Follow directions with this* 5. Drink plenty of fluids 6. Keep thermostat turn down low 7.For any cough or congestion  Use plain Mucinex- regular strength or max strength is  fine   * Children- consult with Pharmacist for dosing 8. For fever or aces or pains- take tylenol or ibuprofen appropriate for age and weight.  * for fevers greater than 101 orally you may alternate ibuprofen and tylenol every  3 hours.   Meds ordered this encounter  Medications  . azithromycin (ZITHROMAX) 250 MG tablet    Sig: Two tablets day one, then one tablet daily next 4 days.    Dispense:  6 tablet    Refill:  0    Order Specific Question:  Supervising Provider    Answer:  Chipper Herb [[id number]]   Mary-Margaret Hassell Done, FNP

## 2015-12-20 NOTE — Patient Instructions (Signed)

## 2015-12-27 ENCOUNTER — Ambulatory Visit (INDEPENDENT_AMBULATORY_CARE_PROVIDER_SITE_OTHER): Payer: Medicare Other | Admitting: Pharmacist Clinician (PhC)/ Clinical Pharmacy Specialist

## 2015-12-27 ENCOUNTER — Encounter: Payer: Self-pay | Admitting: Pharmacist Clinician (PhC)/ Clinical Pharmacy Specialist

## 2015-12-27 DIAGNOSIS — I482 Chronic atrial fibrillation, unspecified: Secondary | ICD-10-CM

## 2015-12-27 LAB — POCT INR: INR: 1.7

## 2016-01-11 ENCOUNTER — Other Ambulatory Visit: Payer: Self-pay | Admitting: Family Medicine

## 2016-02-10 ENCOUNTER — Other Ambulatory Visit: Payer: Self-pay | Admitting: Family Medicine

## 2016-02-14 ENCOUNTER — Ambulatory Visit (INDEPENDENT_AMBULATORY_CARE_PROVIDER_SITE_OTHER): Payer: Medicare Other | Admitting: Pharmacist Clinician (PhC)/ Clinical Pharmacy Specialist

## 2016-02-14 DIAGNOSIS — I482 Chronic atrial fibrillation, unspecified: Secondary | ICD-10-CM

## 2016-02-14 LAB — POCT INR: INR: 1.4

## 2016-02-15 ENCOUNTER — Ambulatory Visit (INDEPENDENT_AMBULATORY_CARE_PROVIDER_SITE_OTHER): Payer: Medicare Other | Admitting: Neurology

## 2016-02-15 ENCOUNTER — Encounter: Payer: Self-pay | Admitting: Neurology

## 2016-02-15 VITALS — BP 177/90 | HR 69 | Temp 97.0°F | Ht 70.0 in | Wt 183.2 lb

## 2016-02-15 DIAGNOSIS — R202 Paresthesia of skin: Secondary | ICD-10-CM

## 2016-02-15 DIAGNOSIS — R200 Anesthesia of skin: Secondary | ICD-10-CM | POA: Insufficient documentation

## 2016-02-15 NOTE — Progress Notes (Signed)
[id number]   Provider: Dr Jaynee Eagles Referring Provider: Chipper Herb, MD Primary Care Physician: Redge Gainer, MD  CC: Bilateral leg numbness  Interval history: Here for numbness in his feet. follow up. HgbA1c 5.9. Labs normal including TSH, sed rate, b12, rpr, hep c, RF, heavy metals. B1, IFE/Spep, ANA and ANA comprehensive panel, lyme. He did not take the gabapentin I prescribed him and he started exercising and he feels better. He tries to change position more often and that helps. He has chronic low back pain that may be contributing. Reviewed all the labs above with patient. Discussed no risk factors for peripheral polyneuropathy except glucose intolerance. His restless leg symptoms have improved with exercise. He has a tread mill and a bicycle at [employer] and some weights. He feels better.  HPI: Dalton Vaughan is a 82 y.o. male here as a referral from Dr. Laurance Flatten for numbness in the legs. He has a past medical history of hypertension, heart disease, atrial fibrillation on chronic Coumadin, heart bypass surgery, lumbar surgery for herniated disc, hyperlipidemia, GERD, hypothyroidism, chronic neck and low back pain. He has had this numbness since for years. The numbness in the feet actually started even earlier than the 70s maybe in the 60s. Started out in the right leg below the knee medially. Then later on he started getting the numbness in his feet especially worse in the evening. Feet get cold and numb. The bottom of the feet are affected. He has burning on the bottom of the feet. Feels like ice. Left is worse than the right. Worse at night and can't get comfortable and feels like he has to move his legs. He has pain in the left hip and groin area with soreness to touch. He has pain in the neck for several months. His feet bother him most at night. He takes remeron at night and that seems to exacerbate the symptoms. Keeps him awake most of the night. If he can go to sleep he  sleeps well. Better during the day when moving around. Symptoms are getting worse. When he was younger he never drank alcohol excessively, he occasionally drinks a glass of wine now, not too often. He was in the [employer] and he has been to lots of places, been exposed to [employer]. He feels like he drags his feet, when he is walking he is not as steady. Balance is affected. He has pain in his hips and has diffculty sometimes. No recent falls. No FHx of neuromuscular disorders or neuropathy. Feels his neuropathy may be related to Agent Orange exposure.   Reviewed notes, labs and imaging from outside physicians, which showed: Patient has chronic left-sided pelvic pain with no significant resolution. He has been seen by GI specialist. He had a CT scan of the pelvis which did not show any etiology. He has completed physical therapy. He is on chronic Coumadin use. He has neck pain, mostly likely degenerative disc disease. No radicular symptoms noted. Primary care was concerned with the symptoms patient was having in his feet and lower limbs, concern whether this could be a peripheral neuropathic process and referred him to neurology.   MRI pelvis 05/2015: IMPRESSION: No evidence of rectal/pelvic floor prolapse.  No evidence of perirectal fluid collection/abscess.  Sigmoid diverticulosis.  Low T2 signal in the left posterior apex of the peripheral gland of the prostate, nonspecific, possibly reflecting prostatitis although prostate cancer is not excluded. Correlate with PSA.  MRi of the brain (personally  reviewed images and agree with the following):  1. No acute intracranial abnormality. 2. Chronic small vessel ischemic disease including involvement of the thalami.  BMP 02/2015 unremarkable TSH 02/2015 wnl LFTs 05/2015 nml    Social History   Social History  . Marital Status: Married    Spouse Name: Dalton Vaughan   . Number of Children: 1  . Years of Education: 12   Occupational  History  . Retired    Social History Main Topics  . Smoking status: Former Smoker    Types: Cigarettes    Start date: 12/17/1948    Quit date: 12/17/1966  . Smokeless tobacco: Never Used  . Alcohol Use: 0.6 oz/week    1 Standard drinks or equivalent per week     Comment: Occassionally  . Drug Use: No  . Sexual Activity: No   Other Topics Concern  . Not on file   Social History Narrative   Married.  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 Heart disease Paternal Aunt   . Heart disease Paternal Uncle   . Colon cancer Neg Hx   . Colon polyps Neg Hx   . Kidney disease Neg Hx   . Esophageal cancer Neg Hx   . Gallbladder disease Neg Hx   . Diabetes Neg Hx   . Cancer Brother     prostate  . Asthma Sister   . Hypertension Maternal Grandmother   . COPD Sister   . CVA Maternal Grandmother   . CVA Paternal Grandmother     Past Medical History  Diagnosis Date  . Hyperlipidemia   . Diverticulosis   . GERD (gastroesophageal reflux disease)   . Atrial fibrillation ([place])   . Vitreous detachment   . Internal hemorrhoids   . CAD (coronary artery disease)     Dr. Wynonia Lawman  . Hypertensive heart disease without CHF   . History of TIAs   . Lumbar disc disease   . BPH (benign prostatic hypertrophy)   . Oral bleeding 08/20/2012    Following molar tooth extraction July, 2013  . Vitamin D deficiency   . Hypothyroidism   . Anal fissure   . Depression 2004  . Hypertension   . Thyroid disease   . Ruptured disk 1995  . Cataract     Past Surgical History  Procedure Laterality Date  . Bilateral cataract surgery  6/07  . Cysloscopy  8/08  . Prostate biopsy  8/08  . Coronary artery bypass graft  12/99     Dr. Servando Snare   . Tonsillectomy and adenoidectomy    . Hernia repair  1997    right  . Eye surgery    . Herninated disc  1995    Current Outpatient Prescriptions    Medication Sig Dispense Refill  . amLODipine (NORVASC) 5 MG tablet TAKE ONE-HALF (1/2) TABLET TWICE A DAY 90 tablet 3  . azelastine (ASTELIN) 137 MCG/SPRAY nasal spray USE 1 TO 2 SPRAYS IN EACH NOSTRIL TWICE A DAY 3 mL 4  . Calcium Carb-Cholecalciferol (CALCIUM + D3) 600-200 MG-UNIT TABS Take 0.25 tablets by mouth daily.    . Cholecalciferol (VITAMIN D-3) 5000 UNITS TABS Take 1 tablet by mouth daily.     Marland Kitchen Co-Enzyme Q10 200 MG CAPS Take 1 tablet by mouth daily.    Marland Kitchen doxazosin (CARDURA) 8 MG tablet TAKE 1 TABLET AT BEDTIME 90  tablet 1  . fluticasone (FLONASE) 50 MCG/ACT nasal spray USE 1 TO 2 SPRAYS IN EACH NOSTRIL DAILY 16 g 5  . levothyroxine (SYNTHROID, LEVOTHROID) 25 MCG tablet TAKE 1 TABLET DAILY 90 tablet 2  . levothyroxine (SYNTHROID, LEVOTHROID) 50 MCG tablet Take 1 tablet (50 mcg total) by mouth daily. 90 tablet 2  . mirtazapine (REMERON) 15 MG tablet Take 1 tablet (15 mg total) by mouth at bedtime. TAKE 1 TABLET DAILY as directed 90 tablet 1  . Multiple Vitamin (MULTIVITAMIN) tablet Take 1 tablet by mouth daily.      . ramipril (ALTACE) 10 MG capsule TAKE 1 CAPSULE DAILY 90 capsule 0  . rosuvastatin (CRESTOR) 5 MG tablet Take 1 tablet (5 mg total) by mouth daily. As directed 90 tablet 1  . triamterene-hydrochlorothiazide (MAXZIDE-25) 37.5-25 MG tablet TAKE 1 TABLET DAILY 90 tablet 1  . vitamin C (ASCORBIC ACID) 500 MG tablet Take 500 mg by mouth daily. Take 1/4 tablet by mouth daily.    Marland Kitchen warfarin (COUMADIN) 5 MG tablet TAKE AS DIRECTed 90 tablet 3  . gabapentin (NEURONTIN) 300 MG capsule Take 2 capsules (600 mg total) by mouth at bedtime. (Patient not taking: Reported on 12/20/2015) 90 capsule 11   No current facility-administered medications for this visit.    Allergies as of 02/15/2016 - Review Complete 02/15/2016  Allergen Reaction Noted  . Paxil [paroxetine hcl] Palpitations 04/22/2013  . Eliquis [apixaban] Other (See Comments) 04/22/2013  . Penicillins  07/05/2012  .  Pravachol [pravastatin sodium]  08/26/2012  . Zetia [ezetimibe]  08/26/2012  . Vytorin [ezetimibe-simvastatin] Other (See Comments) 04/22/2013    Vitals: BP 177/90 mmHg  Pulse 69  Temp(Src) 97 F (36.1 C) (Oral)  Ht 5' 10"  (1.778 m)  Wt 183 lb 3.2 oz (83.099 kg)  BMI 26.29 kg/m2 Last Weight:  Wt Readings from Last 1 Encounters:  02/15/16 183 lb 3.2 oz (83.099 kg)   Last Height:   Ht Readings from Last 1 Encounters:  02/15/16 5' 10"  (1.778 m)   Physical exam: Exam: Gen: NAD, conversant, well nourised, obese, well groomed  CV: irregular rate , no MRG. No Carotid Bruits. No peripheral edema, warm, nontender Eyes: Conjunctivae clear without exudates or hemorrhage  Neuro: Detailed Neurologic Exam  Speech:  Speech is normal; fluent and spontaneous with normal comprehension.  Cognition:  The patient is oriented to person, place, and time;   recent and remote memory intact;   language fluent;   normal attention, concentration,   fund of knowledge Cranial Nerves:  The pupils are equal, round, and reactive to light. The fundi are normal and spontaneous venous pulsations are present. Visual fields are full to finger confrontation. Extraocular movements are intact. Trigeminal sensation is intact and the muscles of mastication are normal. The face is symmetric. The palate elevates in the midline. Hearing intact. Voice is normal. Shoulder shrug is normal. The tongue has normal motion without fasciculations.   Coordination:  Normal finger to nose and heel to shin. Normal rapid alternating movements.   Gait:  Heel-toe and are normal.   Motor Observation:  No asymmetry, no atrophy, and no involuntary movements noted. Tone:  Normal muscle tone.   Posture:  Posture is normal. normal erect   Strength:  Strength is V/V in the upper and lower limbs.    Sensation: intact to LT. Intact pin prick distally. decr temp to the  ankles. 8 seconds vibration. Intact proprioception.    Reflex Exam: Absent AJs otherwise deep tendon reflexes in  the upper and lower extremities are normal bilaterally.    Toes:  The toes are downgoing bilaterally.  Clonus:  Clonus is absent.    Assessment/Plan: 81 y.o. male here as a referral from Dr. Laurance Flatten for numbness in the legs. He has a past medical history of hypertension, heart disease, atrial fibrillation on chronic Coumadin, heart bypass surgery, lumbar surgery for herniated disc, hyperlipidemia, GERD, hypothyroidism, chronic neck and low back pain. He has mild dec. distal sensation mostly small fiber. He also describes symptoms of RLS. Will order neuropathy serum panel which was unremarkable. He is feeling better. Come back to see me as needed.   More small fiber peripheral neuropathy and also some chronic low back pain contributing to symptoms in the legs and feet: labs were all unremrkable and can use neurontin qhs or as needed up to 3x a day. Restless leg syndrom: neurontin qhs as needed, better with exercise. Continue to exercise.  Sarina Ill, MD  [facility] [address] [place], [place] [address]  Phone [phone] Fax [phone]  A total of 30  minutes was spent face-to-face with this patient. Over half this time was spent on counseling patient on the numbness in the legs and feet diagnosis and different diagnostic and therapeutic options available.

## 2016-02-22 ENCOUNTER — Ambulatory Visit (INDEPENDENT_AMBULATORY_CARE_PROVIDER_SITE_OTHER): Payer: Medicare Other

## 2016-02-22 ENCOUNTER — Encounter: Payer: Self-pay | Admitting: Family Medicine

## 2016-02-22 ENCOUNTER — Ambulatory Visit (INDEPENDENT_AMBULATORY_CARE_PROVIDER_SITE_OTHER): Payer: Medicare Other | Admitting: Family Medicine

## 2016-02-22 VITALS — BP 141/77 | HR 56 | Temp 97.0°F | Ht 70.0 in | Wt 185.6 lb

## 2016-02-22 DIAGNOSIS — E559 Vitamin D deficiency, unspecified: Secondary | ICD-10-CM | POA: Diagnosis not present

## 2016-02-22 DIAGNOSIS — K6289 Other specified diseases of anus and rectum: Secondary | ICD-10-CM

## 2016-02-22 DIAGNOSIS — I495 Sick sinus syndrome: Secondary | ICD-10-CM | POA: Diagnosis not present

## 2016-02-22 DIAGNOSIS — M542 Cervicalgia: Secondary | ICD-10-CM

## 2016-02-22 DIAGNOSIS — I482 Chronic atrial fibrillation, unspecified: Secondary | ICD-10-CM

## 2016-02-22 DIAGNOSIS — M519 Unspecified thoracic, thoracolumbar and lumbosacral intervertebral disc disorder: Secondary | ICD-10-CM | POA: Diagnosis not present

## 2016-02-22 DIAGNOSIS — I119 Hypertensive heart disease without heart failure: Secondary | ICD-10-CM

## 2016-02-22 DIAGNOSIS — E039 Hypothyroidism, unspecified: Secondary | ICD-10-CM | POA: Diagnosis not present

## 2016-02-22 DIAGNOSIS — E785 Hyperlipidemia, unspecified: Secondary | ICD-10-CM | POA: Diagnosis not present

## 2016-02-22 DIAGNOSIS — G8929 Other chronic pain: Secondary | ICD-10-CM | POA: Diagnosis not present

## 2016-02-22 NOTE — Progress Notes (Signed)
Subjective:    Patient ID: Dalton Vaughan, male    DOB: May 07, 1929, 82 y.o.   MRN: [id number]  HPI Patient is here to day for a 4 month follow up. Patient also states that he has had neck pain for 1 year. The patient continues to have his chronic rectal pain and has seen multiple specialists for this and no results are causes have been found other than peripheral neuropathy. He has gabapentin, given to him by the neurologist and he has not tried this yet. He is complaining of neck pain which is been going on for 1 year which is worse with turning his head in certain positions but it is there constantly. He does not describe any injury. He denies any chest pain or shortness of breath. He's has no trouble swallowing his food or heartburn indigestion and nausea vomiting diarrhea or blood in the stool. He does not have any abdominal pain. He is passing his water frequently and is always worse and more frequent at nighttime. He denies any burning. He does plan to see the urologist again later this spring or early in the summer.   Review of Systems  Constitutional: Negative.   HENT: Negative.   Eyes: Negative.   Respiratory: Negative.   Cardiovascular: Negative.   Gastrointestinal: Negative.   Endocrine: Negative.   Genitourinary: Negative.   Musculoskeletal: Positive for neck pain.  Skin: Negative.   Allergic/Immunologic: Negative.   Neurological: Negative.   Hematological: Negative.   Psychiatric/Behavioral: Negative.           Patient Active Problem List   Diagnosis Date Noted  . Numbness and tingling of both legs 02/15/2016  . Hereditary and idiopathic peripheral neuropathy 10/16/2015  . Mononeuropathy 10/04/2015  . Allergic rhinitis 05/06/2014  . Depression 08/03/2013  . Sick sinus syndrome ([place]) 07/31/2012  . Hypertensive heart disease without CHF   . Chronic atrial fibrillation ([place])   . CAD (coronary artery disease)   . History of TIAs   . Lumbar disc disease   . GERD  (gastroesophageal reflux disease)   . Long-term (current) use of anticoagulants   . BPH (benign prostatic hypertrophy)   . Hyperlipidemia    Outpatient Encounter Prescriptions as of 02/22/2016  Medication Sig  . amLODipine (NORVASC) 5 MG tablet TAKE ONE-HALF (1/2) TABLET TWICE A DAY  . azelastine (ASTELIN) 137 MCG/SPRAY nasal spray USE 1 TO 2 SPRAYS IN EACH NOSTRIL TWICE A DAY  . Calcium Carb-Cholecalciferol (CALCIUM + D3) 600-200 MG-UNIT TABS Take 0.25 tablets by mouth daily.  . Cholecalciferol (VITAMIN D-3) 5000 UNITS TABS Take 1 tablet by mouth daily.   Marland Kitchen Co-Enzyme Q10 200 MG CAPS Take 1 tablet by mouth daily.  Marland Kitchen doxazosin (CARDURA) 8 MG tablet TAKE 1 TABLET AT BEDTIME  . fluticasone (FLONASE) 50 MCG/ACT nasal spray USE 1 TO 2 SPRAYS IN EACH NOSTRIL DAILY  . levothyroxine (SYNTHROID, LEVOTHROID) 50 MCG tablet Take 1 tablet (50 mcg total) by mouth daily.  . mirtazapine (REMERON) 15 MG tablet Take 1 tablet (15 mg total) by mouth at bedtime. TAKE 1 TABLET DAILY as directed  . Multiple Vitamin (MULTIVITAMIN) tablet Take 1 tablet by mouth daily.    . ramipril (ALTACE) 10 MG capsule TAKE 1 CAPSULE DAILY  . rosuvastatin (CRESTOR) 5 MG tablet Take 1 tablet (5 mg total) by mouth daily. As directed  . triamterene-hydrochlorothiazide (MAXZIDE-25) 37.5-25 MG tablet TAKE 1 TABLET DAILY  . vitamin C (ASCORBIC ACID) 500 MG tablet Take 500 mg by mouth daily.  Take 1/4 tablet by mouth daily.  Marland Kitchen warfarin (COUMADIN) 5 MG tablet TAKE AS DIRECTed  . [DISCONTINUED] gabapentin (NEURONTIN) 300 MG capsule Take 2 capsules (600 mg total) by mouth at bedtime. (Patient not taking: Reported on 02/22/2016)  . [DISCONTINUED] levothyroxine (SYNTHROID, LEVOTHROID) 25 MCG tablet TAKE 1 TABLET DAILY (Patient not taking: Reported on 02/22/2016)   No facility-administered encounter medications on file as of 02/22/2016.       Objective:   Physical Exam  Constitutional: He is oriented to person, place, and time. He appears  well-developed and well-nourished. No distress.  HENT:  Head: Normocephalic and atraumatic.  Right Ear: External ear normal.  Left Ear: External ear normal.  Nose: Nose normal.  Mouth/Throat: Oropharynx is clear and moist.  Eyes: Conjunctivae and EOM are normal. Pupils are equal, round, and reactive to light. Right eye exhibits no discharge. Left eye exhibits no discharge. No scleral icterus.  Neck: Normal range of motion. Neck supple. No thyromegaly present.  No bruits or anterior cervical adenopathy  Cardiovascular: Normal rate, normal heart sounds and intact distal pulses.   No murmur heard. Irregular irregular rhythm at 60-72/m Repeat blood pressure was 148/80 in the right arm sitting with a large cuff. The patient's home blood pressure readings were all good and he will continue with his current treatment  Pulmonary/Chest: Effort normal and breath sounds normal. No respiratory distress. He has no wheezes. He has no rales. He exhibits no tenderness.  There were a few crackles in the right lung base which appeared to be more atelectatic in nature.  Abdominal: Soft. Bowel sounds are normal. He exhibits no mass. There is no tenderness. There is no rebound and no guarding.  Musculoskeletal: Normal range of motion. He exhibits no edema or tenderness.  Lymphadenopathy:    He has no cervical adenopathy.  Neurological: He is alert and oriented to person, place, and time. He has normal reflexes. No cranial nerve deficit.  Skin: Skin is warm and dry. No rash noted.  Psychiatric: He has a normal mood and affect. His behavior is normal. Judgment and thought content normal.  Nursing note and vitals reviewed.  BP 141/77 mmHg  Pulse 56  Temp(Src) 97 F (36.1 C) (Oral)  Ht 5' 10"  (1.778 m)  Wt 185 lb 9.6 oz (84.188 kg)  BMI 26.63 kg/m2  [facility] reading (PRIMARY) by  Dr. Georgian Co pending                                      Assessment & Plan:  1. Hyperlipidemia -Continue current  treatment pending results of lab work - Hepatic function panel - CBC with Differential/Platelet - NMR, lipoprofile  2. Hypothyroidism, unspecified hypothyroidism type -Continue current treatment pending results of lab work - Thyroid Panel With TSH  3. Vitamin D deficiency -Continue vitamin D pending results of lab work  4. hypertension -The blood pressure is slightly elevated today and he brings in readings from home that were good. We will not make any changes today is he has an appointment coming up soon with his cardiologist. - BMP8+EGFR - CBC with Differential/Platelet  5. Sick sinus syndrome ([facility]) -Continue to follow-up with cardiology  6. Lumbar disc disease -Continue to walk and exercise regularly  7. Chronic atrial fibrillation ([facility]) -Continue to follow-up with cardiology  8. Neck pain -Take Tylenol for pain and use warm wet compresses - DG Cervical Spine Complete; Future  9. Chronic rectal pain -Try the gabapentin that was recommended by the neurologist if the rectal pain continues and if the physical therapy and this sitting position that you're doing and do not continue to help  Patient Instructions  Continue current medications. Continue good therapeutic lifestyle changes which include good diet and exercise. Fall precautions discussed with patient. If an FOBT was given today- please return it to our front desk. If you are over 84 years old - you may need Prevnar 30 or the adult Pneumonia vaccine.  Flu Shots will be available at our office starting mid- September. Please call and schedule a FLU CLINIC APPOINTMENT.                        Medicare Annual Wellness Visit  [facility] and the medical providers at [facility] strive to bring you the best medical care.  In doing so we not only want to address your current medical conditions and concerns but also to detect new conditions early and prevent illness, disease and health-related  problems.    Medicare offers a yearly Wellness Visit which allows our clinical staff to assess your need for preventative services including immunizations, lifestyle education, counseling to decrease risk of preventable diseases and screening for fall risk and other medical concerns.    This visit is provided free of charge (no copay) for all Medicare recipients. The clinical pharmacists at [facility] have begun to conduct these Wellness Visits which will also include a thorough review of all your medications.    As you primary medical provider recommend that you make an appointment for your Annual Wellness Visit if you have not done so already this year.  You may set up this appointment before you leave today or you may call back [phone]) and schedule an appointment.  Please make sure when you call that you mention that you are scheduling your Annual Wellness Visit with the clinical pharmacist so that the appointment may be made for the proper length of time.    The patient should use warm wet compresses to the neck and take extra strength Tylenol as needed 3 or 4 times daily. We will call with the results of the x-rays as soon as they become available He should continue to follow-up with the urologist in the early summer for his BPH He should continue to follow-up with the cardiologist as planned We will continue to monitor the rectal pain and if he continues with this he should certainly try the gabapentin that was recommended to him by the neurologist.    Arrie Senate MD

## 2016-02-22 NOTE — Patient Instructions (Addendum)
Continue current medications. Continue good therapeutic lifestyle changes which include good diet and exercise. Fall precautions discussed with patient. If an FOBT was given today- please return it to our front desk. If you are over 80 years old - you may need Prevnar 52 or the adult Pneumonia vaccine.  Flu Shots will be available at our office starting mid- September. Please call and schedule a FLU CLINIC APPOINTMENT.                        Medicare Annual Wellness Visit  [facility] and the medical providers at [facility] strive to bring you the best medical care.  In doing so we not only want to address your current medical conditions and concerns but also to detect new conditions early and prevent illness, disease and health-related problems.    Medicare offers a yearly Wellness Visit which allows our clinical staff to assess your need for preventative services including immunizations, lifestyle education, counseling to decrease risk of preventable diseases and screening for fall risk and other medical concerns.    This visit is provided free of charge (no copay) for all Medicare recipients. The clinical pharmacists at [facility] have begun to conduct these Wellness Visits which will also include a thorough review of all your medications.    As you primary medical provider recommend that you make an appointment for your Annual Wellness Visit if you have not done so already this year.  You may set up this appointment before you leave today or you may call back ([phone]) and schedule an appointment.  Please make sure when you call that you mention that you are scheduling your Annual Wellness Visit with the clinical pharmacist so that the appointment may be made for the proper length of time.    The patient should use warm wet compresses to the neck and take extra strength Tylenol as needed 3 or 4 times daily. We will call with the results of the  x-rays as soon as they become available He should continue to follow-up with the urologist in the early summer for his BPH He should continue to follow-up with the cardiologist as planned We will continue to monitor the rectal pain and if he continues with this he should certainly try the gabapentin that was recommended to him by the neurologist.

## 2016-02-23 ENCOUNTER — Other Ambulatory Visit: Payer: Self-pay | Admitting: *Deleted

## 2016-02-23 ENCOUNTER — Other Ambulatory Visit: Payer: Medicare Other

## 2016-02-23 DIAGNOSIS — E039 Hypothyroidism, unspecified: Secondary | ICD-10-CM | POA: Diagnosis not present

## 2016-02-23 DIAGNOSIS — E785 Hyperlipidemia, unspecified: Secondary | ICD-10-CM

## 2016-02-23 DIAGNOSIS — K219 Gastro-esophageal reflux disease without esophagitis: Secondary | ICD-10-CM | POA: Diagnosis not present

## 2016-02-23 DIAGNOSIS — E559 Vitamin D deficiency, unspecified: Secondary | ICD-10-CM | POA: Diagnosis not present

## 2016-02-23 DIAGNOSIS — N400 Enlarged prostate without lower urinary tract symptoms: Secondary | ICD-10-CM | POA: Diagnosis not present

## 2016-02-23 DIAGNOSIS — M4310 Spondylolisthesis, site unspecified: Secondary | ICD-10-CM

## 2016-02-24 LAB — HEPATIC FUNCTION PANEL
ALT: 13 IU/L (ref 0–44)
AST: 18 IU/L (ref 0–40)
Albumin: 4.2 g/dL (ref 3.5–4.7)
Alkaline Phosphatase: 90 IU/L (ref 39–117)
Bilirubin Total: 0.5 mg/dL (ref 0.0–1.2)
Bilirubin, Direct: 0.18 mg/dL (ref 0.00–0.40)
Total Protein: 6.4 g/dL (ref 6.0–8.5)

## 2016-02-24 LAB — CBC WITH DIFFERENTIAL/PLATELET
Basophils Absolute: 0 10*3/uL (ref 0.0–0.2)
Basos: 0 %
EOS (ABSOLUTE): 0.1 10*3/uL (ref 0.0–0.4)
Eos: 2 %
Hematocrit: 41 % (ref 37.5–51.0)
Hemoglobin: 13.3 g/dL (ref 12.6–17.7)
Immature Grans (Abs): 0 10*3/uL (ref 0.0–0.1)
Immature Granulocytes: 0 %
Lymphocytes Absolute: 2.4 10*3/uL (ref 0.7–3.1)
Lymphs: 41 %
MCH: 29.3 pg (ref 26.6–33.0)
MCHC: 32.4 g/dL (ref 31.5–35.7)
MCV: 90 fL (ref 79–97)
Monocytes Absolute: 0.5 10*3/uL (ref 0.1–0.9)
Monocytes: 8 %
Neutrophils Absolute: 2.9 10*3/uL (ref 1.4–7.0)
Neutrophils: 49 %
Platelets: 153 10*3/uL (ref 150–379)
RBC: 4.54 x10E6/uL (ref 4.14–5.80)
RDW: 16.1 % — ABNORMAL HIGH (ref 12.3–15.4)
WBC: 5.8 10*3/uL (ref 3.4–10.8)

## 2016-02-24 LAB — NMR, LIPOPROFILE
Cholesterol: 159 mg/dL (ref 100–199)
HDL Cholesterol by NMR: 46 mg/dL (ref >39)
HDL Particle Number: 29.3 umol/L — ABNORMAL LOW (ref >=30.5)
LDL Particle Number: 1295 nmol/L — ABNORMAL HIGH (ref <1000)
LDL Size: 21.2 nm (ref >20.5)
LDL-C: 99 mg/dL (ref 0–99)
LP-IR Score: <25 (ref <=45)
Small LDL Particle Number: 569 nmol/L — ABNORMAL HIGH (ref <=527)
Triglycerides by NMR: 71 mg/dL (ref 0–149)

## 2016-02-24 LAB — BMP8+EGFR
BUN/Creatinine Ratio: 13 (ref 10–22)
BUN: 11 mg/dL (ref 8–27)
CO2: 28 mmol/L (ref 18–29)
Calcium: 9.1 mg/dL (ref 8.6–10.2)
Chloride: 103 mmol/L (ref 96–106)
Creatinine, Ser: 0.84 mg/dL (ref 0.76–1.27)
GFR calc Af Amer: 93 mL/min/{1.73_m2} (ref >59)
GFR calc non Af Amer: 80 mL/min/{1.73_m2} (ref >59)
Glucose: 84 mg/dL (ref 65–99)
Potassium: 4.2 mmol/L (ref 3.5–5.2)
Sodium: 144 mmol/L (ref 134–144)

## 2016-02-24 LAB — VITAMIN D 25 HYDROXY (VIT D DEFICIENCY, FRACTURES): Vit D, 25-Hydroxy: 76.2 ng/mL (ref 30.0–100.0)

## 2016-03-06 ENCOUNTER — Ambulatory Visit
Admission: RE | Admit: 2016-03-06 | Discharge: 2016-03-06 | Disposition: A | Discharge status: Home / Self Care | Payer: Medicare Other | Source: Ambulatory Visit | Attending: Family Medicine | Admitting: Family Medicine

## 2016-03-06 ENCOUNTER — Telehealth: Payer: Self-pay

## 2016-03-06 DIAGNOSIS — M4802 Spinal stenosis, cervical region: Secondary | ICD-10-CM | POA: Diagnosis not present

## 2016-03-06 DIAGNOSIS — M4310 Spondylolisthesis, site unspecified: Secondary | ICD-10-CM

## 2016-03-06 MED ORDER — LEVOTHYROXINE SODIUM 50 MCG PO TABS: 50.0000 ug | ORAL_TABLET | Freq: Every day | ORAL | Status: DC

## 2016-03-06 NOTE — Telephone Encounter (Signed)
rx refill

## 2016-03-08 ENCOUNTER — Encounter: Payer: Self-pay | Admitting: Family Medicine

## 2016-03-08 ENCOUNTER — Ambulatory Visit (INDEPENDENT_AMBULATORY_CARE_PROVIDER_SITE_OTHER): Payer: Medicare Other | Admitting: Family Medicine

## 2016-03-08 VITALS — BP 168/83 | HR 68 | Temp 97.0°F | Ht 70.0 in | Wt 185.0 lb

## 2016-03-08 DIAGNOSIS — M542 Cervicalgia: Secondary | ICD-10-CM | POA: Diagnosis not present

## 2016-03-08 DIAGNOSIS — M4802 Spinal stenosis, cervical region: Secondary | ICD-10-CM

## 2016-03-08 NOTE — Patient Instructions (Signed)
Appointment to be made with neurosurgery for further follow-up

## 2016-03-08 NOTE — Progress Notes (Signed)
Patient ID: Dalton Vaughan, male   DOB: Dec 01, 1929, 82 y.o.   MRN: [id number]   Patient comes by today for follow up from recent MRI. The findings on the MRI were discussed with the patient. He has spondylosis at multiple levels and spurring with foraminal narrowing at multiple levels with some spinal stenosis. And model was pulled out and the anatomical findings were explained to the patient using a model. He has had the pain for over a year and he is not a complainer. It was decided to send the patient to Dr. Vertell Limber who takes care of his wife for further evaluation and possible injection in the neck to help control the pain that he is been experiencing. All questions were answered and the description of the findings were made as clear as possible before the patient sees the neurosurgeon. He will take the disc from the radiology department with him to see the surgeon. About 10-15 minutes of time was spent with the patient explaining the findings from the MRI and the reason for referring to the neurosurgeon.  1. Foraminal stenosis of cervical region - Ambulatory referral to Neurosurgery  2. Neck pain  Patient Instructions  Appointment to be made with neurosurgery for further follow-up   Arrie Senate MD

## 2016-03-24 ENCOUNTER — Other Ambulatory Visit: Payer: Self-pay | Admitting: Family Medicine

## 2016-04-02 ENCOUNTER — Encounter: Payer: Self-pay | Admitting: Cardiology

## 2016-04-02 DIAGNOSIS — I482 Chronic atrial fibrillation: Secondary | ICD-10-CM | POA: Diagnosis not present

## 2016-04-02 DIAGNOSIS — E785 Hyperlipidemia, unspecified: Secondary | ICD-10-CM | POA: Diagnosis not present

## 2016-04-02 DIAGNOSIS — Z951 Presence of aortocoronary bypass graft: Secondary | ICD-10-CM | POA: Diagnosis not present

## 2016-04-02 DIAGNOSIS — I495 Sick sinus syndrome: Secondary | ICD-10-CM | POA: Diagnosis not present

## 2016-04-02 DIAGNOSIS — Z8673 Personal history of transient ischemic attack (TIA), and cerebral infarction without residual deficits: Secondary | ICD-10-CM | POA: Diagnosis not present

## 2016-04-02 DIAGNOSIS — Z7901 Long term (current) use of anticoagulants: Secondary | ICD-10-CM | POA: Diagnosis not present

## 2016-04-02 DIAGNOSIS — I2510 Atherosclerotic heart disease of native coronary artery without angina pectoris: Secondary | ICD-10-CM | POA: Diagnosis not present

## 2016-04-02 DIAGNOSIS — I119 Hypertensive heart disease without heart failure: Secondary | ICD-10-CM | POA: Diagnosis not present

## 2016-04-02 NOTE — Progress Notes (Unsigned)
Patient ID: Dalton Vaughan, male   DOB: [birth year], 82 y.o.   MRN: [id number]  Dalton, Vaughan    Date of visit:  04/02/2016 DOB:  1929/03/22    Age:  82 yrs. Medical record number:  [id number]     Account number:  [id number] Primary Care Provider: Redge Gainer ____________________________ CURRENT DIAGNOSES  1. CAD Native without angina  2. Chronic atrial fibrillation  3. Long term (current) use of anticoagulants  4. Presence of aortocoronary bypass graft  5. Sick sinus syndrome  6. Hyperlipidemia  7. Hypertensive heart disease without heart failure  8. Personal history of transient ischemic attack (TIA), and cerebral infarction without residual deficits ____________________________ ALLERGIES  Ezetimibe, Muscle aches  Penicillins, Intolerance-unknown  Pravastatin, Muscle aches  Simvastatin, Muscle aches ____________________________ MEDICATIONS  1. multivitamin tablet, 1 p.o. q.d.  2. nitroglycerin 0.4 mg tablet, sublingual, PRN  3. coQ10 (ubiquinol) 100 mg capsule, 1 p.o. daily  4. ramipril 10 mg capsule, 1 p.o. daily  5. Remeron 15 mg tablet, 1/2 tab daily  6. Cardura 8 mg Tablet, 3/4 tab qd  7. Warfarin 5 mg Tablet, Take as directed  8. triamterene-hydrochlorothiazid 37.5-25 mg Tablet, 3/4 tab qd  9. amlodipine 5 mg tablet, 1/2 tab b.i.d.  10. Stool Softener 100 mg tablet, prn  11. Crestor 10 mg tablet, 1/2 tab m-w-f  12. Vitamin D3 5,000 unit tablet, 1 p.o. daily  13. Calcium 600 + D(3) 600 mg calcium-200 unit capsule, 1/2 tab daily  14. levothyroxine 50 mcg tablet, 1 p.o. daily  15. Vitamin C 500 mg tablet, 1/2 tab daily ____________________________ HISTORY OF PRESENT ILLNESS Patient seen for cardiac followup. He has been doing well since he was previously here. He is exercising on a regular basis and his lipids were reviewed today and show good control. He denies angina and has no PND, orthopnea, syncope, palpitations, or claudication. Wife has been ill and he has been tending to her  at home. He has no bleeding complications from anticoagulation. ____________________________ PAST HISTORY  Past Medical Illnesses:  hypertension, hyperlipidemia, CVA without deficits January 2003, history of depression 2004, GERD, BPH, TIA May 2009;  Cardiovascular Illnesses:  arrhythmia-PVCs, sick sinus syndrome, CAD, atrial fibrillation;  Surgical Procedures:  CABG w LIMA to LAD, SVG to int, SVG to OM2, SVG to AM-RCA 11/22/98 Dr. Servando Snare, laminectomy lumbar, tonsillectomy/adenoids, inguinal herniorrhaphy-rt;  NYHA Classification:  I;  Canadian Angina Classification:  Class 0: Asymptomatic;  Cardiology Procedures-Invasive:  cardiac cath (left) November 1999;  Cardiology Procedures-Noninvasive:  treadmill cardiolite April 2008, echocardiogram October 2007, treadmill cardiolite May 2011, event monitor August 2013;  Cardiac Cath Results:  normal Left main, 80% stenosis proximal LAD, 90% stenosis proximal CFX, 70% stenosis proximal RCA, 99% stenosis distal RCA;  LVEF of 69% documented via nuclear study on 04/21/2010,  CHADS Score:  4,  CHA2DS2-VASC Score:  7 ____________________________ CARDIO-PULMONARY TEST DATES EKG Date:  04/08/2015;  CABG: 11/22/1998;  Holter/Event Monitor Date: 08/01/2012;  Nuclear Study Date:  05/11/2010;  Echocardiography Date: 09/30/2006;  Chest Xray Date: 02/21/2012;   ____________________________ FAMILY HISTORY Brother -- Brother alive and well Father -- Father dead, Death of unknown cause Mother -- Mother dead, Death due to natural cause, Influenza/Pneumonia Sister -- Sister alive and well Sister -- Sister alive and well ____________________________ SOCIAL HISTORY Alcohol Use:  drinks occasionally and wine;  Smoking:  used to smoke but quit Prior to 1980;  Diet:  fat modified diet;  Lifestyle:  married;  Exercise:  exercises daily;  Occupation:  retired  and Cytogeneticist at [employer];  Residence:  lives with wife, wife has multiple myeloma;    ____________________________ REVIEW OF SYSTEMS General:  malaise and fatigue Eyes: cataracts, wears eye glasses/contact lenses Respiratory: denies dyspnea, cough, wheezing or hemoptysis. Cardiovascular:  please review HPI Abdominal: denies dyspepsia, GI bleeding, constipation, or diarrhea Genitourinary-Male: nocturia  Musculoskeletal:  chronic low back pain, arthritis of the knees Neurological:  denies headaches, stroke, or TIA  ____________________________ PHYSICAL EXAMINATION VITAL SIGNS  Blood Pressure:  138/84 Sitting, Left arm, regular cuff   Pulse:  60/min. Respirations:  14/min. Weight:  181.00 lbs. Height:  70"BMI: 26  Constitutional:  pleasant white male in no acute distress Skin:  scattered seborrhic keratosis, tattoos Head:  normocephalic, balding male hair pattern ENT:  ears, nose and throat reveal no gross abnormalities.  Dentition good. Neck:  supple, no masses, thyromegaly, JVD. Carotid pulses are full and equal bilaterally without bruits. Chest:  healed median sternotomy scar, clear to auscultation Cardiac:  irregular rhythm, normal S1 and S2, no S3 or S4, no murmur Peripheral Pulses:  pulses full and equal in all extremities Extremities & Back:  well healed saphenous vein donor site RLE, trace edema Neurological:  no gross motor or sensory deficits noted, affect appropriate, oriented x3. ____________________________ MOST RECENT LIPID PANEL 06/01/15  CHOL TOTL 152 mg/dl, LDL 89 NM, HDL 48 mg/dl, TRIGLYCER 76 mg/dl, ALT 8 u/l, ALK PHOS 81 u/l and AST 15 u/l ____________________________ IMPRESSIONS/PLAN  1. Chronic atrial fibrillation without complications 2. Coronary artery disease with previous bypass grafting with no angina 3. Long term use of anticoagulants without complications 4. Hyperlipidemia under treatment 5. Prior history of TIA without recurrence  RECOMMENDATIONS:  Blood pressure and lipids continued to be under good control. Followup in 6 months and  continue warfarin anticoagulation. Call if problems. Discussed his wife's illness. ____________________________ TODAYS ORDERS  1. Return Visit: 6 months  2. 12 Lead EKG: 6 months                       ____________________________ Cardiology Physician:  Kerry Hough MD [facility]

## 2016-04-03 ENCOUNTER — Encounter: Payer: Self-pay | Admitting: Pharmacist Clinician (PhC)/ Clinical Pharmacy Specialist

## 2016-04-10 ENCOUNTER — Ambulatory Visit (INDEPENDENT_AMBULATORY_CARE_PROVIDER_SITE_OTHER): Payer: Medicare Other | Admitting: Pharmacist Clinician (PhC)/ Clinical Pharmacy Specialist

## 2016-04-10 DIAGNOSIS — I482 Chronic atrial fibrillation, unspecified: Secondary | ICD-10-CM

## 2016-04-10 LAB — COAGUCHEK XS/INR WAIVED
INR: 1.9 — ABNORMAL HIGH (ref 0.9–1.1)
Prothrombin Time: 23.1 s

## 2016-04-10 NOTE — Patient Instructions (Signed)
Anticoagulation Dose Instructions as of 04/10/2016      [name] Tue Wed Thu Fri Sat   New Dose 2.5 mg 5 mg 2.5 mg 5 mg 2.5 mg 5 mg 2.5 mg    Description        Continue taking warfarin the same way. Taking new brand of warfarin from pharmacy INR today is 1.9   Goal 1.5-2.0

## 2016-04-19 ENCOUNTER — Encounter: Payer: Self-pay | Admitting: *Deleted

## 2016-04-25 DIAGNOSIS — M47812 Spondylosis without myelopathy or radiculopathy, cervical region: Secondary | ICD-10-CM | POA: Insufficient documentation

## 2016-04-25 DIAGNOSIS — G629 Polyneuropathy, unspecified: Secondary | ICD-10-CM | POA: Diagnosis not present

## 2016-04-25 DIAGNOSIS — I10 Essential (primary) hypertension: Secondary | ICD-10-CM | POA: Diagnosis not present

## 2016-04-25 DIAGNOSIS — M542 Cervicalgia: Secondary | ICD-10-CM | POA: Diagnosis not present

## 2016-04-25 DIAGNOSIS — Z6825 Body mass index (BMI) 25.0-25.9, adult: Secondary | ICD-10-CM | POA: Diagnosis not present

## 2016-04-25 DIAGNOSIS — M545 Low back pain: Secondary | ICD-10-CM | POA: Diagnosis not present

## 2016-05-08 ENCOUNTER — Other Ambulatory Visit: Payer: Self-pay | Admitting: Family Medicine

## 2016-05-10 ENCOUNTER — Ambulatory Visit: Payer: Medicare Other | Attending: Neurosurgery | Admitting: Physical Therapy

## 2016-05-10 DIAGNOSIS — R293 Abnormal posture: Secondary | ICD-10-CM | POA: Diagnosis not present

## 2016-05-10 DIAGNOSIS — M542 Cervicalgia: Secondary | ICD-10-CM

## 2016-05-10 NOTE — Therapy (Signed)
[facility] [place], [place], [address] Phone: [phone]   Fax:  [phone]  Physical Therapy Evaluation  Patient Details  Name: Dalton Vaughan MRN: [id number] Date of Birth: 11-13-1929 Referring Provider: Erline Levine MD  Encounter Date: 05/10/2016      PT End of Session - 05/10/16 1726    Visit Number 1   Number of Visits 16   Date for PT Re-Evaluation 07/09/16   PT Start Time 1030   PT Stop Time 1121   PT Time Calculation (min) 51 min   Activity Tolerance Patient tolerated treatment well   Behavior During Therapy [facility] for tasks assessed/performed      Past Medical History  Diagnosis Date  . Hyperlipidemia   . Diverticulosis   . GERD (gastroesophageal reflux disease)   . Atrial fibrillation ([place])   . Vitreous detachment   . Internal hemorrhoids   . CAD (coronary artery disease)     Dr. Wynonia Lawman  . Hypertensive heart disease without CHF   . History of TIAs   . Lumbar disc disease   . BPH (benign prostatic hypertrophy)   . Oral bleeding 08/20/2012    Following molar tooth extraction July, 2013  . Vitamin D deficiency   . Hypothyroidism   . Anal fissure   . Depression 2004  . Hypertension   . Thyroid disease   . Ruptured disk 1995  . Cataract     Past Surgical History  Procedure Laterality Date  . Bilateral cataract surgery  6/07  . Cysloscopy  8/08  . Prostate biopsy  8/08  . Coronary artery bypass graft  12/99     Dr. Servando Snare   . Tonsillectomy and adenoidectomy    . Hernia repair  1997    right  . Eye surgery    . Herninated disc  1995    There were no vitals filed for this visit.       Subjective Assessment - 05/10/16 1705    Subjective I experience pain from my neck into my left shoulder.   Limitations Sitting   How long can you sit comfortably? 15 minutes.   Diagnostic tests X-Aidyn and MRI.   Patient Stated Goals Move my neck with less pain and get rid of the left shoulder pain.   Currently in Pain? Yes   Pain Score 4    Pain Location Neck   Pain Orientation Left   Pain Descriptors / Indicators Sharp   Pain Onset More than a month ago   Pain Frequency Constant   Aggravating Factors  Turning head.   Pain Relieving Factors Keeping neck still.            [facility] PT Assessment - 05/10/16 0001    Assessment   Medical Diagnosis Spondylosis of cervical region.   Referring Provider Erline Levine MD   Onset Date/Surgical Date --  18 months.   Precautions   Precaution Comments --  Carotid vascular disease.   Restrictions   Weight Bearing Restrictions No   Balance Screen   Has the patient fallen in the past 6 months No   Has the patient had a decrease in activity level because of a fear of falling?  No   Is the patient reluctant to leave their home because of a fear of falling?  No   Home Ecologist residence   Prior Function   Level of Independence Independent   Posture/Postural Control   Posture/Postural Control Postural limitations  Postural Limitations Rounded Shoulders;Forward head   ROM / Strength   AROM / PROM / Strength AROM;Strength   AROM   AROM Assessment Site Cervical   Cervical - Right Side Bend 7   Cervical - Left Side Bend 8   Cervical - Right Rotation 50   Cervical - Left Rotation 44   Strength   Overall Strength Comments Left shoulder and elbow strength normal.   Palpation   Palpation comment Tender to palpation into left cervical C5 area.   Special Tests    Special Tests --  Left bicep DTR less brisk than other bil UE DTR's.   Ambulation/Gait   Gait Comments WNL.                   [facility] PT Treatment/Exercise - 05/10/16 0001    Modalities   Modalities Electrical Stimulation;Moist Heat   Moist Heat Therapy   Number Minutes Moist Heat 15 Minutes   Moist Heat Location Cervical   Electrical Stimulation   Electrical Stimulation Location Left cervical region.   Electrical Stimulation  Action Pre-mod.   Electrical Stimulation Parameters 80-150 Hz (5 sec on 5 sec off) x 15 minutes.   Electrical Stimulation Goals Pain                  PT Short Term Goals - 05/10/16 1751    PT SHORT TERM GOAL #1   Title Ind with a HEP.   Time 2   Period Weeks   Status New           PT Long Term Goals - 05/10/16 1752    PT LONG TERM GOAL #1   Title Increase active cervical rotation to 65 degrees+ so patient can turn head more easily while driving.   Time 8   Period Weeks   Status New   PT LONG TERM GOAL #2   Title Eliminate left UE symptoms.   Time 8   Period Weeks   Status New   PT LONG TERM GOAL #3   Title Sit 30 minutes with pain not > 3/10.   Time 8   Period Weeks   Status New               Plan - 05/10/16 1727    Clinical Impression Statement The patient reports an 18 month of neck and right shoulder pain that has really not improved.  His resting pain-level is a 4/10 but goes to 7+/10 with movement of his neck.  An X-[place] revealed:   Diffuse severe cervical spine DJD rib and mild 2 mm anterolisthesis.  An MRI revealed facet hypertrophy and spuring as well as well forminal narrowing.     Rehab Potential Good   PT Frequency 2x / week   PT Duration 8 weeks   PT Treatment/Interventions Moist Heat;Therapeutic activities;Therapeutic exercise;Passive range of motion;Manual techniques   PT Next Visit Plan Please instruct patient in chin tucks and cervical extension to performed throughout the day.  Combo E'stim/U/S to left C-spine; STW.  May try gentle cervical traction in supine.   Consulted and Agree with Plan of Care Patient      Patient will benefit from skilled therapeutic intervention in order to improve the following deficits and impairments:  Pain, Decreased range of motion  Visit Diagnosis: Cervicalgia - Plan: PT plan of care cert/re-cert  Abnormal posture - Plan: PT plan of care cert/re-cert      G-Codes - 41/28/78 1753    Functional  Assessment Tool Used FOTO...49% limitation.   Functional Limitation Other PT primary   Other PT Primary Current Status (N9914) At least 40 percent but less than 60 percent impaired, limited or restricted   Other PT Primary Goal Status (C4584) At least 20 percent but less than 40 percent impaired, limited or restricted       Problem List Patient Active Problem List   Diagnosis Date Noted  . Hereditary and idiopathic peripheral neuropathy 10/16/2015  . Mononeuropathy 10/04/2015  . Allergic rhinitis 05/06/2014  . Depression 08/03/2013  . Sick sinus syndrome ([place]) 07/31/2012  . Hypertensive heart disease without CHF   . Chronic atrial fibrillation ([place])   . CAD (coronary artery disease)   . History of TIAs   . Lumbar disc disease   . GERD (gastroesophageal reflux disease)   . Long-term (current) use of anticoagulants   . BPH (benign prostatic hypertrophy)   . Hyperlipidemia     Tonyia Marschall, [place] MPT 05/10/2016, 6:03 PM  [facility] [address] [place], [place], [address] Phone: [phone]   Fax:  [phone]  Name: Dalton Vaughan MRN: [id number] Date of Birth: [birth year]

## 2016-05-16 ENCOUNTER — Encounter: Payer: Self-pay | Admitting: Physical Therapy

## 2016-05-16 ENCOUNTER — Ambulatory Visit: Payer: Medicare Other | Admitting: Physical Therapy

## 2016-05-16 DIAGNOSIS — R293 Abnormal posture: Secondary | ICD-10-CM

## 2016-05-16 DIAGNOSIS — M542 Cervicalgia: Secondary | ICD-10-CM | POA: Diagnosis not present

## 2016-05-16 NOTE — Patient Instructions (Signed)
AROM: Neck Rotation   Turn head slowly to look over one shoulder, then the other. Hold each position _10___ seconds. Repeat _5___ times per set. Do __2__ sets per session. Do _2-3___ sessions per day.  http://orth.exer.us/294   Copyright  VHI. All rights reserved.  AROM: Lateral Neck Flexion   Slowly tilt head toward one shoulder, then the other. Hold each position _10___ seconds. Repeat __5__ times per set. Do __2__ sets per session. Do __2-3__ sessions per day.  http://orth.exer.us/296   Copyright  VHI. All rights reserved.  Stretch Break - Chin Tuck   Looking straight forward, tuck chin and hold __10__ seconds. Relax and return to starting position. Repeat __5-10__ times every _3-4___ hours.  Copyright  VHI. All rights reserved.  Stretch Break - Chest and Shoulder Stretch   Maintaining erect posture, draw shoulders back while bringing elbows back and inward. Return to starting position. Repeat __10-20__ times every _3-4___ hours.  Copyright  VHI. All rights reserved.    

## 2016-05-16 NOTE — Therapy (Signed)
[facility] [place], [place], [address] Phone: [phone]   Fax:  [phone]  Physical Therapy Treatment  Patient Details  Name: Dalton Vaughan MRN: [id number] Date of Birth: 1929/04/28 Referring Provider: Erline Levine MD  Encounter Date: 05/16/2016      PT End of Session - 05/16/16 1012    Visit Number 2   Number of Visits 16   Date for PT Re-Evaluation 07/09/16   PT Start Time 0944   PT Stop Time 1029   PT Time Calculation (min) 45 min   Activity Tolerance Patient tolerated treatment well   Behavior During Therapy [facility] for tasks assessed/performed      Past Medical History  Diagnosis Date  . Hyperlipidemia   . Diverticulosis   . GERD (gastroesophageal reflux disease)   . Atrial fibrillation ([place])   . Vitreous detachment   . Internal hemorrhoids   . CAD (coronary artery disease)     Dr. Wynonia Lawman  . Hypertensive heart disease without CHF   . History of TIAs   . Lumbar disc disease   . BPH (benign prostatic hypertrophy)   . Oral bleeding 08/20/2012    Following molar tooth extraction July, 2013  . Vitamin D deficiency   . Hypothyroidism   . Anal fissure   . Depression 2004  . Hypertension   . Thyroid disease   . Ruptured disk 1995  . Cataract     Past Surgical History  Procedure Laterality Date  . Bilateral cataract surgery  6/07  . Cysloscopy  8/08  . Prostate biopsy  8/08  . Coronary artery bypass graft  12/99     Dr. Servando Snare   . Tonsillectomy and adenoidectomy    . Hernia repair  1997    right  . Eye surgery    . Herninated disc  1995    There were no vitals filed for this visit.      Subjective Assessment - 05/16/16 1002    Subjective Patient reported feeling good after last treatment   Limitations Sitting   How long can you sit comfortably? 15 minutes.   Diagnostic tests X-Jhonnie and MRI.   Patient Stated Goals Move my neck with less pain and get rid of the left shoulder pain.   Currently  in Pain? Yes   Pain Score 2    Pain Location Neck   Pain Orientation Left   Pain Descriptors / Indicators Sharp   Pain Type Chronic pain   Pain Onset More than a month ago   Pain Frequency Constant   Aggravating Factors  turning head   Pain Relieving Factors at rest                         [facility] Adult PT Treatment/Exercise - 05/16/16 0001    Modalities   Modalities Electrical Stimulation;Ultrasound;Moist Heat   Moist Heat Therapy   Number Minutes Moist Heat 15 Minutes   Moist Heat Location Cervical   Electrical Stimulation   Electrical Stimulation Location Left cervical region.   Electrical Stimulation Action premod   Electrical Stimulation Parameters 80-150hz    Electrical Stimulation Goals Pain   Ultrasound   Ultrasound Location left c-spine UT area   Ultrasound Parameters 1.5w/cm2/50%/38mz x197m   Ultrasound Goals Pain   Manual Therapy   Manual Therapy Myofascial release;Soft tissue mobilization   Myofascial Release manual and IASTW to left c-spine paraspinals and UT /levator area  PT Education - 05/16/16 1011    Education provided Yes   Education Details HEP cervical/posture   Person(s) Educated Patient   Methods Explanation;Demonstration;Handout   Comprehension Verbalized understanding;Returned demonstration          PT Short Term Goals - 05/16/16 1010    PT SHORT TERM GOAL #1   Title Ind with a HEP.   Time 2   Period Weeks   Status Achieved           PT Long Term Goals - 05/10/16 1752    PT LONG TERM GOAL #1   Title Increase active cervical rotation to 65 degrees+ so patient can turn head more easily while driving.   Time 8   Period Weeks   Status New   PT LONG TERM GOAL #2   Title Eliminate left UE symptoms.   Time 8   Period Weeks   Status New   PT LONG TERM GOAL #3   Title Sit 30 minutes with pain not > 3/10.   Time 8   Period Weeks   Status New               Plan - 05/16/16 1013     Clinical Impression Statement Patient progressing today with less pain and tolerated treatment well. HEP given for cervial posture exercises, patint has good understanding and is independent. STG#1 met others ongoing due to pain and cervical ROM deficits.   Rehab Potential Good   PT Frequency 2x / week   PT Duration 8 weeks   PT Treatment/Interventions Moist Heat;Therapeutic activities;Therapeutic exercise;Passive range of motion;Manual techniques;Ultrasound   PT Next Visit Plan cont with POC for  Combo E'stim/U/S to left C-spine; STW.  May try gentle cervical traction in supine.   Consulted and Agree with Plan of Care Patient      Patient will benefit from skilled therapeutic intervention in order to improve the following deficits and impairments:  Pain, Decreased range of motion  Visit Diagnosis: Cervicalgia  Abnormal posture     Problem List Patient Active Problem List   Diagnosis Date Noted  . Hereditary and idiopathic peripheral neuropathy 10/16/2015  . Mononeuropathy 10/04/2015  . Allergic rhinitis 05/06/2014  . Depression 08/03/2013  . Sick sinus syndrome ([place]) 07/31/2012  . Hypertensive heart disease without CHF   . Chronic atrial fibrillation ([place])   . CAD (coronary artery disease)   . History of TIAs   . Lumbar disc disease   . GERD (gastroesophageal reflux disease)   . Long-term (current) use of anticoagulants   . BPH (benign prostatic hypertrophy)   . Hyperlipidemia     Dalton Vaughan, PTA 05/16/2016, 10:32 AM  [facility] [place], [place], [address] Phone: [phone]   Fax:  [phone]  Name: Dalton Vaughan MRN: [id number] Date of Birth: 09-Feb-1929

## 2016-05-22 ENCOUNTER — Encounter: Payer: Self-pay | Admitting: Pharmacist Clinician (PhC)/ Clinical Pharmacy Specialist

## 2016-05-23 ENCOUNTER — Ambulatory Visit: Payer: Medicare Other | Attending: Neurosurgery | Admitting: Physical Therapy

## 2016-05-23 ENCOUNTER — Encounter: Payer: Self-pay | Admitting: Physical Therapy

## 2016-05-23 DIAGNOSIS — R293 Abnormal posture: Secondary | ICD-10-CM | POA: Insufficient documentation

## 2016-05-23 DIAGNOSIS — M542 Cervicalgia: Secondary | ICD-10-CM | POA: Diagnosis not present

## 2016-05-23 NOTE — Therapy (Signed)
[facility] [place], [place], [address] Phone: [phone]   Fax:  [phone]  Physical Therapy Treatment  Patient Details  Name: Dalton Vaughan MRN: [id number] Date of Birth: [birth year] Referring Provider: Erline Levine MD  Encounter Date: 05/23/2016      PT End of Session - 05/23/16 0925    Visit Number 3   Number of Visits 16   Date for PT Re-Evaluation 07/09/16   PT Start Time 0917   PT Stop Time 1000   PT Time Calculation (min) 43 min   Activity Tolerance Patient tolerated treatment well   Behavior During Therapy [facility] for tasks assessed/performed      Past Medical History  Diagnosis Date  . Hyperlipidemia   . Diverticulosis   . GERD (gastroesophageal reflux disease)   . Atrial fibrillation ([place])   . Vitreous detachment   . Internal hemorrhoids   . CAD (coronary artery disease)     Dr. Wynonia Lawman  . Hypertensive heart disease without CHF   . History of TIAs   . Lumbar disc disease   . BPH (benign prostatic hypertrophy)   . Oral bleeding 08/20/2012    Following molar tooth extraction July, 2013  . Vitamin D deficiency   . Hypothyroidism   . Anal fissure   . Depression 2004  . Hypertension   . Thyroid disease   . Ruptured disk 1995  . Cataract     Past Surgical History  Procedure Laterality Date  . Bilateral cataract surgery  6/07  . Cysloscopy  8/08  . Prostate biopsy  8/08  . Coronary artery bypass graft  12/99     Dr. Servando Snare   . Tonsillectomy and adenoidectomy    . Hernia repair  1997    right  . Eye surgery    . Herninated disc  1995    There were no vitals filed for this visit.      Subjective Assessment - 05/23/16 0918    Subjective Patient reported feeling good after last treatment, 1/10 at rest and 3-4/10 with cervical rotation   Limitations Sitting   How long can you sit comfortably? 15 minutes.   Diagnostic tests X-Dong and MRI.   Patient Stated Goals Move my neck with less pain and  get rid of the left shoulder pain.   Currently in Pain? Yes   Pain Score 1    Pain Location Neck   Pain Orientation Left   Pain Descriptors / Indicators Sharp   Pain Type Chronic pain   Pain Onset More than a month ago   Pain Frequency Constant   Aggravating Factors  turning head   Pain Relieving Factors at rest            [facility] PT Assessment - 05/23/16 0001    AROM   AROM Assessment Site Cervical   Cervical - Right Rotation 45   Cervical - Left Rotation 45                     [facility] Adult PT Treatment/Exercise - 05/23/16 0001    Moist Heat Therapy   Number Minutes Moist Heat 15 Minutes   Moist Heat Location Cervical   Electrical Stimulation   Electrical Stimulation Location Left cervical region.   Electrical Stimulation Action premod   Electrical Stimulation Parameters 80-_0    Electrical Stimulation Goals Pain   Ultrasound   Ultrasound Location left c-spine   Ultrasound Parameters 1.5w/cm2/50%/37mz x144m   Manual Therapy  Manual Therapy Myofascial release;Soft tissue mobilization   Myofascial Release manual and IASTW to left c-spine paraspinals and UT /levator area                  PT Short Term Goals - 05/16/16 1010    PT SHORT TERM GOAL #1   Title Ind with a HEP.   Time 2   Period Weeks   Status Achieved           PT Long Term Goals - 05/23/16 0920    PT LONG TERM GOAL #1   Title Increase active cervical rotation to 65 degrees+ so patient can turn head more easily while driving.   Time 8   Period Weeks   Status On-going  AROM bil rotation 45 degrees 05/23/16   PT LONG TERM GOAL #2   Title Eliminate left UE symptoms.   Time 8   Period Weeks   Status Partially Met  no symptoms in a few days 05/23/16   PT LONG TERM GOAL #3   Title Sit 30 minutes with pain not > 3/10.   Time 8   Period Weeks   Status On-going               Plan - 05/23/16 7867    Clinical Impression Statement Patient tolerated treatment well  today and has reported his neck has not been "locking up" as much. Patient has not improved with AROM cervical rotation today due to pain limitations up to 3-4/10. Patient has noticed no UE symptoms in two days.  LTG #2 partially met and other goals ongoing due to pain and ROM deficits.   Rehab Potential Good   PT Frequency 2x / week   PT Duration 8 weeks   PT Treatment/Interventions Moist Heat;Therapeutic activities;Therapeutic exercise;Passive range of motion;Manual techniques;Ultrasound   PT Next Visit Plan cont with POC for  Combo E'stim/U/S to left C-spine; STW.  May try gentle cervical traction in supine.   Consulted and Agree with Plan of Care Patient      Patient will benefit from skilled therapeutic intervention in order to improve the following deficits and impairments:  Pain, Decreased range of motion  Visit Diagnosis: Cervicalgia  Abnormal posture     Problem List Patient Active Problem List   Diagnosis Date Noted  . Hereditary and idiopathic peripheral neuropathy 10/16/2015  . Mononeuropathy 10/04/2015  . Allergic rhinitis 05/06/2014  . Depression 08/03/2013  . Sick sinus syndrome ([place]) 07/31/2012  . Hypertensive heart disease without CHF   . Chronic atrial fibrillation ([place])   . CAD (coronary artery disease)   . History of TIAs   . Lumbar disc disease   . GERD (gastroesophageal reflux disease)   . Long-term (current) use of anticoagulants   . BPH (benign prostatic hypertrophy)   . Hyperlipidemia     Alixandrea Milleson P, PTA 05/23/2016, 10:42 AM  [facility] [place], [place], [address] Phone: [phone]   Fax:  [phone]  Name: Dalton Vaughan MRN: [id number] Date of Birth: 10/20/1929

## 2016-05-25 ENCOUNTER — Encounter: Payer: Medicare Other | Admitting: Pharmacist Clinician (PhC)/ Clinical Pharmacy Specialist

## 2016-05-30 ENCOUNTER — Encounter: Payer: Self-pay | Admitting: Physical Therapy

## 2016-05-30 ENCOUNTER — Ambulatory Visit: Payer: Medicare Other | Admitting: Physical Therapy

## 2016-05-30 DIAGNOSIS — R293 Abnormal posture: Secondary | ICD-10-CM | POA: Diagnosis not present

## 2016-05-30 DIAGNOSIS — M542 Cervicalgia: Secondary | ICD-10-CM | POA: Diagnosis not present

## 2016-05-30 NOTE — Therapy (Signed)
[facility] [address] [place], [place], [address] Phone: [phone]   Fax:  [phone]  Physical Therapy Treatment  Patient Details  Name: Dalton Vaughan MRN: [id number] Date of Birth: 10/11/1929 Referring Provider: Joseph Stern MD  Encounter Date: 05/30/2016      PT End of Session - 05/30/16 0955    Visit Number 4   Number of Visits 16   Date for PT Re-Evaluation 07/09/16   PT Start Time 0945   PT Stop Time 1027   PT Time Calculation (min) 42 min   Activity Tolerance Patient tolerated treatment well   Behavior During Therapy [facility] for tasks assessed/performed      Past Medical History  Diagnosis Date  . Hyperlipidemia   . Diverticulosis   . GERD (gastroesophageal reflux disease)   . Atrial fibrillation ([facility])   . Vitreous detachment   . Internal hemorrhoids   . CAD (coronary artery disease)     Dr. Tilley  . Hypertensive heart disease without CHF   . History of TIAs   . Lumbar disc disease   . BPH (benign prostatic hypertrophy)   . Oral bleeding 08/20/2012    Following molar tooth extraction July, 2013  . Vitamin D deficiency   . Hypothyroidism   . Anal fissure   . Depression 2004  . Hypertension   . Thyroid disease   . Ruptured disk 1995  . Cataract     Past Surgical History  Procedure Laterality Date  . Bilateral cataract surgery  6/07  . Cysloscopy  8/08  . Prostate biopsy  8/08  . Coronary artery bypass graft  12/99     Dr. Gerhardt   . Tonsillectomy and adenoidectomy    . Hernia repair  1997    right  . Eye surgery    . Herninated disc  1995    There were no vitals filed for this visit.      Subjective Assessment - 05/30/16 0947    Subjective Patient has little pain at rest yet continues to incerese with certain movements   Limitations Sitting   How long can you sit comfortably? 15 minutes.   Diagnostic tests X-Bauer and MRI.   Patient Stated Goals Move my neck with less pain and get rid of the  left shoulder pain.   Currently in Pain? Yes   Pain Score 2    Pain Location Neck   Pain Orientation Left   Pain Descriptors / Indicators Sharp   Pain Type Chronic pain   Pain Onset More than a month ago   Pain Frequency Constant   Aggravating Factors  turning head a certain way   Pain Relieving Factors at rest                         [facility] Adult PT Treatment/Exercise - 05/30/16 0001    Moist Heat Therapy   Number Minutes Moist Heat 15 Minutes   Moist Heat Location Cervical   Electrical Stimulation   Electrical Stimulation Location Left cervical region.   Electrical Stimulation Action premod   Electrical Stimulation Parameters 80-150hz   Electrical Stimulation Goals Pain   Ultrasound   Ultrasound Location left c-spine    Ultrasound Parameters 1.5w/cm2/50%/1mhz x 10min   Ultrasound Goals Pain   Manual Therapy   Manual Therapy Myofascial release;Soft tissue mobilization   Myofascial Release manual and IASTW to left c-spine paraspinals and UT /levator area                    PT Short Term Goals - 05/16/16 1010    PT SHORT TERM GOAL #1   Title Ind with a HEP.   Time 2   Period Weeks   Status Achieved           PT Long Term Goals - 05/23/16 0920    PT LONG TERM GOAL #1   Title Increase active cervical rotation to 65 degrees+ so patient can turn head more easily while driving.   Time 8   Period Weeks   Status On-going  AROM bil rotation 45 degrees 05/23/16   PT LONG TERM GOAL #2   Title Eliminate left UE symptoms.   Time 8   Period Weeks   Status Partially Met  no symptoms in a few days 05/23/16   PT LONG TERM GOAL #3   Title Sit 30 minutes with pain not > 3/10.   Time 8   Period Weeks   Status On-going               Plan - 05/30/16 0956    Clinical Impression Statement Patient continues to respond well to treatment. Patient progressing and has reported 50% better overall. Patient has increased pain when turning head into  cervical rotation. Goals ongoing due to pain and ROM deficits.   Rehab Potential Good   PT Frequency 2x / week   PT Duration 8 weeks   PT Treatment/Interventions Moist Heat;Therapeutic activities;Therapeutic exercise;Passive range of motion;Manual techniques;Ultrasound   PT Next Visit Plan cont with POC for  Combo E'stim/U/S to left C-spine; STW.  May try gentle cervical traction in supine.   Consulted and Agree with Plan of Care Patient      Patient will benefit from skilled therapeutic intervention in order to improve the following deficits and impairments:  Pain, Decreased range of motion  Visit Diagnosis: Cervicalgia  Abnormal posture     Problem List Patient Active Problem List   Diagnosis Date Noted  . Hereditary and idiopathic peripheral neuropathy 10/16/2015  . Mononeuropathy 10/04/2015  . Allergic rhinitis 05/06/2014  . Depression 08/03/2013  . Sick sinus syndrome ([place]) 07/31/2012  . Hypertensive heart disease without CHF   . Chronic atrial fibrillation ([place])   . CAD (coronary artery disease)   . History of TIAs   . Lumbar disc disease   . GERD (gastroesophageal reflux disease)   . Long-term (current) use of anticoagulants   . BPH (benign prostatic hypertrophy)   . Hyperlipidemia     Louann Hopson P, PTA 05/30/2016, 10:27 AM  [facility] [place], [place], [address] Phone: [phone]   Fax:  [phone]  Name: Dalton Vaughan MRN: [id number] Date of Birth: 06-Nov-1929

## 2016-06-01 ENCOUNTER — Ambulatory Visit (INDEPENDENT_AMBULATORY_CARE_PROVIDER_SITE_OTHER): Payer: Medicare Other | Admitting: Pharmacist Clinician (PhC)/ Clinical Pharmacy Specialist

## 2016-06-01 DIAGNOSIS — I482 Chronic atrial fibrillation, unspecified: Secondary | ICD-10-CM

## 2016-06-01 LAB — COAGUCHEK XS/INR WAIVED
INR: 1.6 — ABNORMAL HIGH (ref 0.9–1.1)
Prothrombin Time: 18.9 s

## 2016-06-01 NOTE — Patient Instructions (Signed)
Anticoagulation Dose Instructions as of 06/01/2016      Dalton Vaughan Tue Wed Thu Fri Sat   New Dose 2.5 mg 5 mg 2.5 mg 5 mg 2.5 mg 5 mg 2.5 mg    Description        Continue taking warfarin the same way.  INR today is 1.6

## 2016-06-06 DIAGNOSIS — N401 Enlarged prostate with lower urinary tract symptoms: Secondary | ICD-10-CM | POA: Diagnosis not present

## 2016-06-06 DIAGNOSIS — R972 Elevated prostate specific antigen [PSA]: Secondary | ICD-10-CM | POA: Diagnosis not present

## 2016-06-06 DIAGNOSIS — R351 Nocturia: Secondary | ICD-10-CM | POA: Diagnosis not present

## 2016-06-07 ENCOUNTER — Encounter: Payer: Self-pay | Admitting: Physical Therapy

## 2016-06-07 ENCOUNTER — Ambulatory Visit: Payer: Medicare Other | Admitting: Physical Therapy

## 2016-06-07 DIAGNOSIS — M542 Cervicalgia: Secondary | ICD-10-CM | POA: Diagnosis not present

## 2016-06-07 DIAGNOSIS — R293 Abnormal posture: Secondary | ICD-10-CM | POA: Diagnosis not present

## 2016-06-07 NOTE — Therapy (Signed)
[facility] [place], [place], [address] Phone: [phone]   Fax:  [phone]  Physical Therapy Treatment  Patient Details  Name: Dalton Vaughan MRN: [id number] Date of Birth: 08/30/1929 Referring Provider: Erline Levine MD  Encounter Date: 06/07/2016      PT End of Session - 06/07/16 1008    Visit Number 5   Number of Visits 16   Date for PT Re-Evaluation 07/09/16   PT Start Time 0946   PT Stop Time 1030   PT Time Calculation (min) 44 min   Activity Tolerance Patient tolerated treatment well   Behavior During Therapy [facility] for tasks assessed/performed      Past Medical History  Diagnosis Date  . Hyperlipidemia   . Diverticulosis   . GERD (gastroesophageal reflux disease)   . Atrial fibrillation ([place])   . Vitreous detachment   . Internal hemorrhoids   . CAD (coronary artery disease)     Dr. Wynonia Lawman  . Hypertensive heart disease without CHF   . History of TIAs   . Lumbar disc disease   . BPH (benign prostatic hypertrophy)   . Oral bleeding 08/20/2012    Following molar tooth extraction July, 2013  . Vitamin D deficiency   . Hypothyroidism   . Anal fissure   . Depression 2004  . Hypertension   . Thyroid disease   . Ruptured disk 1995  . Cataract     Past Surgical History  Procedure Laterality Date  . Bilateral cataract surgery  6/07  . Cysloscopy  8/08  . Prostate biopsy  8/08  . Coronary artery bypass graft  12/99     Dr. Servando Snare   . Tonsillectomy and adenoidectomy    . Hernia repair  1997    right  . Eye surgery    . Herninated disc  1995    There were no vitals filed for this visit.      Subjective Assessment - 06/07/16 0951    Subjective Patient has little pain at rest yet continues to incerese with certain movements   Limitations Sitting   How long can you sit comfortably? 15 minutes.   Diagnostic tests X-Jacorian and MRI.   Patient Stated Goals Move my neck with less pain and get rid of the  left shoulder pain.   Currently in Pain? Yes   Pain Score 2    Pain Location Neck   Pain Orientation Left   Pain Descriptors / Indicators Sharp   Pain Type Chronic pain   Pain Onset More than a month ago   Pain Frequency Intermittent   Aggravating Factors  turning head   Pain Relieving Factors at rest            [facility] PT Assessment - 06/07/16 0001    AROM   AROM Assessment Site Cervical   Cervical - Right Rotation 50   Cervical - Left Rotation 50                     [facility] Adult PT Treatment/Exercise - 06/07/16 0001    Exercises   Exercises Shoulder;Neck   Neck Exercises: Machines for Strengthening   UBE (Upper Arm Bike) 77mn @120  posture focus   Neck Exercises: Theraband   Scapula Retraction 20 reps  pink xts   Neck Exercises: Standing   Neck Retraction 20 reps;10 secs  with ball stabi all exercises('W", "V", bil 2# scaption    Wall Push Ups 20 reps   Neck Exercises:  Seated   Cervical Isometrics 5 secs;Flexion;Extension;Right lateral flexion;Left lateral flexion;5 reps   Shoulder Shrugs 10 reps   Other Seated Exercise cervical chin tuck with slight ext x10                  PT Short Term Goals - 05/16/16 1010    PT SHORT TERM GOAL #1   Title Ind with a HEP.   Time 2   Period Weeks   Status Achieved           PT Long Term Goals - 06/07/16 1009    PT LONG TERM GOAL #1   Title Increase active cervical rotation to 65 degrees+ so patient can turn head more easily while driving.   Time 8   Period Weeks   Status On-going  AROM 50 degrees bil 06/07/16   PT LONG TERM GOAL #2   Title Eliminate left UE symptoms.   Time 8   Period Weeks   Status Partially Met  has not has symptoms in 6 days 06/07/16   PT LONG TERM GOAL #3   Title Sit 30 minutes with pain not > 3/10.   Time 8   Period Weeks   Status Achieved  1/10 with sitting 06/07/16               Plan - 06/07/16 1018    Clinical Impression Statement patient progressing with  all activities today. Patient tolerated treatment well today with no reports of pain or difficulty. Patient has improved AROM in bil cervical rotation today. Patient was able to progress through cervical and posture strengthening today. Patient has been almost a week without left UE symptoms. Patient feels 50% better overall. Patient met LTG #1 due ti 1/10 pain at most with sitting, other goals ongoing due to ROM deficits.   Rehab Potential Good   PT Frequency 2x / week   PT Duration 8 weeks   PT Treatment/Interventions Moist Heat;Therapeutic activities;Therapeutic exercise;Passive range of motion;Manual techniques;Ultrasound   PT Next Visit Plan cont with POC for strengthening and modalities PRN   Consulted and Agree with Plan of Care Patient      Patient will benefit from skilled therapeutic intervention in order to improve the following deficits and impairments:  Pain, Decreased range of motion  Visit Diagnosis: Cervicalgia  Abnormal posture     Problem List Patient Active Problem List   Diagnosis Date Noted  . Hereditary and idiopathic peripheral neuropathy 10/16/2015  . Mononeuropathy 10/04/2015  . Allergic rhinitis 05/06/2014  . Depression 08/03/2013  . Sick sinus syndrome ([place]) 07/31/2012  . Hypertensive heart disease without CHF   . Chronic atrial fibrillation ([place])   . CAD (coronary artery disease)   . History of TIAs   . Lumbar disc disease   . GERD (gastroesophageal reflux disease)   . Long-term (current) use of anticoagulants   . BPH (benign prostatic hypertrophy)   . Hyperlipidemia     Wm Sahagun P, PTA 06/07/2016, 10:36 AM  [facility] [place], [place], [address] Phone: [phone]   Fax:  [phone]  Name: Dalton Vaughan MRN: [id number] Date of Birth: 04-06-1929

## 2016-06-13 ENCOUNTER — Encounter: Payer: Self-pay | Admitting: Physical Therapy

## 2016-06-13 ENCOUNTER — Ambulatory Visit: Payer: Medicare Other | Admitting: Physical Therapy

## 2016-06-13 DIAGNOSIS — M542 Cervicalgia: Secondary | ICD-10-CM | POA: Diagnosis not present

## 2016-06-13 DIAGNOSIS — R293 Abnormal posture: Secondary | ICD-10-CM

## 2016-06-13 NOTE — Therapy (Signed)
[facility] [place], [place], [address] Phone: [phone]   Fax:  [phone]  Physical Therapy Treatment  Patient Details  Name: Dalton Vaughan MRN: [id number] Date of Birth: 1929/10/17 Referring Provider: Erline Levine MD  Encounter Date: 06/13/2016      PT End of Session - 06/13/16 1059    Visit Number 6   Number of Visits 16   Date for PT Re-Evaluation 07/09/16   PT Start Time 6962   PT Stop Time 1113   PT Time Calculation (min) 44 min   Activity Tolerance Patient tolerated treatment well   Behavior During Therapy [facility] for tasks assessed/performed      Past Medical History  Diagnosis Date  . Hyperlipidemia   . Diverticulosis   . GERD (gastroesophageal reflux disease)   . Atrial fibrillation ([place])   . Vitreous detachment   . Internal hemorrhoids   . CAD (coronary artery disease)     Dr. Wynonia Lawman  . Hypertensive heart disease without CHF   . History of TIAs   . Lumbar disc disease   . BPH (benign prostatic hypertrophy)   . Oral bleeding 08/20/2012    Following molar tooth extraction July, 2013  . Vitamin D deficiency   . Hypothyroidism   . Anal fissure   . Depression 2004  . Hypertension   . Thyroid disease   . Ruptured disk 1995  . Cataract     Past Surgical History  Procedure Laterality Date  . Bilateral cataract surgery  6/07  . Cysloscopy  8/08  . Prostate biopsy  8/08  . Coronary artery bypass graft  12/99     Dr. Servando Snare   . Tonsillectomy and adenoidectomy    . Hernia repair  1997    right  . Eye surgery    . Herninated disc  1995    There were no vitals filed for this visit.      Subjective Assessment - 06/13/16 1034    Subjective Patient reported feeling good after last treatment and "some better"   Limitations Sitting   How long can you sit comfortably? 15 minutes.   Diagnostic tests X-Hezzie and MRI.   Patient Stated Goals Move my neck with less pain and get rid of the left shoulder  pain.   Currently in Pain? Yes   Pain Score 2    Pain Location Neck   Pain Orientation Left   Pain Descriptors / Indicators Sharp   Pain Type Chronic pain   Pain Onset More than a month ago   Pain Frequency Intermittent   Aggravating Factors  turning head into cervical rotation   Pain Relieving Factors at rest                         [facility] Adult PT Treatment/Exercise - 06/13/16 0001    Neck Exercises: Machines for Strengthening   UBE (Upper Arm Bike) 63mn @120  posture focus   Neck Exercises: Theraband   Scapula Retraction 20 reps  pink XTS   Neck Exercises: Standing   Neck Retraction Other reps (comment)  cervical stab w/ball 2# flexion, 2# 'V', 0 'W'   Wall Push Ups 20 reps   Neck Exercises: Seated   Cervical Isometrics 5 secs;Flexion;Extension;Right lateral flexion;Left lateral flexion;5 reps   Shoulder Shrugs 10 reps  standing 2x10 each way with 3#   Other Seated Exercise cervical chin tuck with slight ext x10   Moist Heat Therapy   Number  Minutes Moist Heat 15 Minutes   Moist Heat Location Cervical   Electrical Stimulation   Electrical Stimulation Location left cervical area   Electrical Stimulation Action premod   Electrical Stimulation Parameters 80-150hz    Electrical Stimulation Goals Pain                  PT Short Term Goals - 05/16/16 1010    PT SHORT TERM GOAL #1   Title Ind with a HEP.   Time 2   Period Weeks   Status Achieved           PT Long Term Goals - 06/13/16 1051    PT LONG TERM GOAL #1   Title Increase active cervical rotation to 65 degrees+ so patient can turn head more easily while driving.   Period Weeks   Status On-going  AROM 55 degrees right and 50 degrees left   PT LONG TERM GOAL #2   Title Eliminate left UE symptoms.   Time 8   Period Weeks   Status Achieved  06/13/16   PT LONG TERM GOAL #3   Title Sit 30 minutes with pain not > 3/10.   Time 8   Period Weeks   Status Achieved                Plan - 06/13/16 1056    Clinical Impression Statement Patient continues to progress with cervical stabilization exercises. Patient has reported no left UE symptoms and only has soreness in left cervical region up to 2/10 at most. Patient has the soreness when turning into cervical rotation to the left. Patient improving with ROM yet unable to get full ROM. Patient met LTG #2 and last goal ongoing due to Full ROM defict.    Rehab Potential Good   PT Frequency 2x / week   PT Duration 8 weeks   PT Treatment/Interventions Moist Heat;Therapeutic activities;Therapeutic exercise;Passive range of motion;Manual techniques;Ultrasound   PT Next Visit Plan cont with POC for strengthening and modalities PRN   Consulted and Agree with Plan of Care Patient      Patient will benefit from skilled therapeutic intervention in order to improve the following deficits and impairments:  Pain, Decreased range of motion  Visit Diagnosis: Cervicalgia  Abnormal posture     Problem List Patient Active Problem List   Diagnosis Date Noted  . Hereditary and idiopathic peripheral neuropathy 10/16/2015  . Mononeuropathy 10/04/2015  . Allergic rhinitis 05/06/2014  . Depression 08/03/2013  . Sick sinus syndrome ([place]) 07/31/2012  . Hypertensive heart disease without CHF   . Chronic atrial fibrillation ([place])   . CAD (coronary artery disease)   . History of TIAs   . Lumbar disc disease   . GERD (gastroesophageal reflux disease)   . Long-term (current) use of anticoagulants   . BPH (benign prostatic hypertrophy)   . Hyperlipidemia     Raychel Dowler P, PTA 06/13/2016, 11:19 AM  [facility] [place], [place], [address] Phone: [phone]   Fax:  [phone]  Name: Dalton Vaughan MRN: [id number] Date of Birth: Mar 09, 1929

## 2016-06-13 NOTE — Therapy (Signed)
[facility] [place], [place], [address] Phone: [phone]   Fax:  [phone]  Physical Therapy Treatment  Patient Details  Name: Dalton Vaughan MRN: [id number] Date of Birth: May 03, 1929 Referring Provider: Erline Levine MD  Encounter Date: 06/07/2016    Past Medical History  Diagnosis Date  . Hyperlipidemia   . Diverticulosis   . GERD (gastroesophageal reflux disease)   . Atrial fibrillation ([place])   . Vitreous detachment   . Internal hemorrhoids   . CAD (coronary artery disease)     Dr. Wynonia Lawman  . Hypertensive heart disease without CHF   . History of TIAs   . Lumbar disc disease   . BPH (benign prostatic hypertrophy)   . Oral bleeding 08/20/2012    Following molar tooth extraction July, 2013  . Vitamin D deficiency   . Hypothyroidism   . Anal fissure   . Depression 2004  . Hypertension   . Thyroid disease   . Ruptured disk 1995  . Cataract     Past Surgical History  Procedure Laterality Date  . Bilateral cataract surgery  6/07  . Cysloscopy  8/08  . Prostate biopsy  8/08  . Coronary artery bypass graft  12/99     Dr. Servando Snare   . Tonsillectomy and adenoidectomy    . Hernia repair  1997    right  . Eye surgery    . Herninated disc  1995    There were no vitals filed for this visit.      Subjective Assessment - 06/13/16 1034    Subjective Patient reported feeling good after lasat treatment and "some better"   Limitations Sitting   How long can you sit comfortably? 15 minutes.   Diagnostic tests X-Janes and MRI.   Patient Stated Goals Move my neck with less pain and get rid of the left shoulder pain.   Currently in Pain? Yes   Pain Score 2    Pain Location Neck   Pain Orientation Left   Pain Descriptors / Indicators Sharp   Pain Type Chronic pain   Pain Onset More than a month ago   Pain Frequency Intermittent   Aggravating Factors  turning head into cervical rotation   Pain Relieving Factors at  rest                         [facility] Adult PT Treatment/Exercise - 06/13/16 0001    Neck Exercises: Machines for Strengthening   UBE (Upper Arm Bike) 67mn @120  posture focus   Neck Exercises: Theraband   Scapula Retraction 20 reps  pink XTS   Neck Exercises: Standing   Neck Retraction Other reps (comment)  cervical stab w/ball 2# flexion, 2# 'V', 0 'W'                  PT Short Term Goals - 05/16/16 1010    PT SHORT TERM GOAL #1   Title Ind with a HEP.   Time 2   Period Weeks   Status Achieved           PT Long Term Goals - 06/07/16 1009    PT LONG TERM GOAL #1   Title Increase active cervical rotation to 65 degrees+ so patient can turn head more easily while driving.   Time 8   Period Weeks   Status On-going  AROM 50 degrees bil 06/07/16   PT LONG TERM GOAL #2   Title Eliminate left UE  symptoms.   Time 8   Period Weeks   Status Partially Met  has not has symptoms in 6 days 06/07/16   PT LONG TERM GOAL #3   Title Sit 30 minutes with pain not > 3/10.   Time 8   Period Weeks   Status Achieved  1/10 with sitting 06/07/16             Patient will benefit from skilled therapeutic intervention in order to improve the following deficits and impairments:  Pain, Decreased range of motion  Visit Diagnosis: Cervicalgia  Abnormal posture     Problem List Patient Active Problem List   Diagnosis Date Noted  . Hereditary and idiopathic peripheral neuropathy 10/16/2015  . Mononeuropathy 10/04/2015  . Allergic rhinitis 05/06/2014  . Depression 08/03/2013  . Sick sinus syndrome ([place]) 07/31/2012  . Hypertensive heart disease without CHF   . Chronic atrial fibrillation ([place])   . CAD (coronary artery disease)   . History of TIAs   . Lumbar disc disease   . GERD (gastroesophageal reflux disease)   . Long-term (current) use of anticoagulants   . BPH (benign prostatic hypertrophy)   . Hyperlipidemia     Bence Trapp P,  PTA 06/13/2016, 10:46 AM  [facility] [place], [place], [address] Phone: [phone]   Fax:  [phone]  Name: Dalton Vaughan MRN: [id number] Date of Birth: November 06, 1929

## 2016-06-20 ENCOUNTER — Ambulatory Visit: Payer: Medicare Other | Attending: Neurosurgery | Admitting: Physical Therapy

## 2016-06-20 DIAGNOSIS — R293 Abnormal posture: Secondary | ICD-10-CM | POA: Diagnosis not present

## 2016-06-20 DIAGNOSIS — M542 Cervicalgia: Secondary | ICD-10-CM | POA: Diagnosis not present

## 2016-06-20 NOTE — Therapy (Addendum)
[facility] [place], [place], [address] Phone: [phone]   Fax:  [phone]  Physical Therapy Treatment  Patient Details  Name: Dalton Vaughan MRN: [id number] Date of Birth: Aug 25, 1929 Referring Provider: Erline Levine MD  Encounter Date: 06/20/2016      PT End of Session - 06/20/16 1102    Visit Number 7   Number of Visits 16   Date for PT Re-Evaluation 07/09/16   PT Start Time 1025   PT Stop Time 1120   PT Time Calculation (min) 55 min   Activity Tolerance Patient tolerated treatment well   Behavior During Therapy [facility] for tasks assessed/performed      Past Medical History  Diagnosis Date  . Hyperlipidemia   . Diverticulosis   . GERD (gastroesophageal reflux disease)   . Atrial fibrillation ([place])   . Vitreous detachment   . Internal hemorrhoids   . CAD (coronary artery disease)     Dr. Wynonia Lawman  . Hypertensive heart disease without CHF   . History of TIAs   . Lumbar disc disease   . BPH (benign prostatic hypertrophy)   . Oral bleeding 08/20/2012    Following molar tooth extraction July, 2013  . Vitamin D deficiency   . Hypothyroidism   . Anal fissure   . Depression 2004  . Hypertension   . Thyroid disease   . Ruptured disk 1995  . Cataract     Past Surgical History  Procedure Laterality Date  . Bilateral cataract surgery  6/07  . Cysloscopy  8/08  . Prostate biopsy  8/08  . Coronary artery bypass graft  12/99     Dr. Servando Snare   . Tonsillectomy and adenoidectomy    . Hernia repair  1997    right  . Eye surgery    . Herninated disc  1995    There were no vitals filed for this visit.      Subjective Assessment - 06/20/16 1028    Subjective Pt reports he still has stiffness but overall feels "better' than before   Patient Stated Goals Move my neck with less pain and get rid of the left shoulder pain.   Currently in Pain? Yes   Pain Score 3    Pain Location Neck   Pain Orientation Left   Pain  Descriptors / Indicators Sharp   Pain Type Chronic pain   Pain Onset More than a month ago                         [facility] Adult PT Treatment/Exercise - 06/20/16 0001    Neck Exercises: Machines for Strengthening   UBE (Upper Arm Bike) 6 min @ 120   Neck Exercises: Theraband   Scapula Retraction 20 reps  pink XTS   Neck Exercises: Standing   Neck Retraction 20 reps;Other reps (comment)  with ball, "W", "V", 2# scaption   Wall Push Ups 20 reps   Neck Exercises: Seated   Shoulder Shrugs 20 reps  3#   Upper Extremity D1 10 reps;Theraband   Theraband Level (UE D1) Level 1 (Yellow)   Other Seated Exercise horizontal shoulder abduction 2# x 20 with c spine in neutral   Moist Heat Therapy   Number Minutes Moist Heat 15 Minutes   Moist Heat Location Cervical   Electrical Stimulation   Electrical Stimulation Location cervical   Electrical Stimulation Action premod   Electrical Stimulation Parameters 80-150 hz   Electrical Stimulation Goals  Pain   Manual Therapy   Manual Therapy Myofascial release   Myofascial Release Lt paraspinals, upper trap, levator                  PT Short Term Goals - 05/16/16 1010    PT SHORT TERM GOAL #1   Title Ind with a HEP.   Time 2   Period Weeks   Status Achieved           PT Long Term Goals - 06/20/16 1103    PT LONG TERM GOAL #1   Title Increase active cervical rotation to 65 degrees+ so patient can turn head more easily while driving.   Time 8   Period Weeks   Status On-going   PT LONG TERM GOAL #2   Title Eliminate left UE symptoms.   Status Achieved   PT LONG TERM GOAL #3   Title Sit 30 minutes with pain not > 3/10.   Status Achieved               Plan - 06/20/16 1103    Clinical Impression Statement Pt progressing, still wiht decreased cervical ROM, pain 3/10 today.   Rehab Potential Good   PT Frequency 2x / week   PT Duration 8 weeks   PT Treatment/Interventions Moist Heat;Therapeutic  activities;Therapeutic exercise;Passive range of motion;Manual techniques;Ultrasound   PT Next Visit Plan strengthening, manual   Consulted and Agree with Plan of Care Patient      Patient will benefit from skilled therapeutic intervention in order to improve the following deficits and impairments:  Pain, Decreased range of motion  Visit Diagnosis: Cervicalgia  Abnormal posture     Problem List Patient Active Problem List   Diagnosis Date Noted  . Hereditary and idiopathic peripheral neuropathy 10/16/2015  . Mononeuropathy 10/04/2015  . Allergic rhinitis 05/06/2014  . Depression 08/03/2013  . Sick sinus syndrome ([place]) 07/31/2012  . Hypertensive heart disease without CHF   . Chronic atrial fibrillation ([place])   . CAD (coronary artery disease)   . History of TIAs   . Lumbar disc disease   . GERD (gastroesophageal reflux disease)   . Long-term (current) use of anticoagulants   . BPH (benign prostatic hypertrophy)   . Hyperlipidemia     Isabelle Course, PT, DPT  06/20/2016, 11:04 AM  [facility] [address] [place], [place], [address] Phone: [phone]   Fax:  [phone]  Name: Dalton Vaughan MRN: [id number] Date of Birth: 08/16/1929  PHYSICAL THERAPY DISCHARGE SUMMARY  Visits from Start of Care: 7.  Current functional level related to goals / functional outcomes: See above.   Remaining deficits: Very good progress though continued loss of cervical ROM.   Education / Equipment: HEP.  Plan: Patient agrees to discharge.  Patient goals were partially met. Patient is being discharged due to being pleased with the current functional level.  ?????       Mali Applegate MPT

## 2016-07-02 ENCOUNTER — Ambulatory Visit (INDEPENDENT_AMBULATORY_CARE_PROVIDER_SITE_OTHER): Payer: Medicare Other | Admitting: Family Medicine

## 2016-07-02 ENCOUNTER — Encounter: Payer: Self-pay | Admitting: Family Medicine

## 2016-07-02 VITALS — BP 142/84 | HR 64 | Temp 97.7°F | Ht 70.0 in | Wt 181.0 lb

## 2016-07-02 DIAGNOSIS — M519 Unspecified thoracic, thoracolumbar and lumbosacral intervertebral disc disorder: Secondary | ICD-10-CM | POA: Diagnosis not present

## 2016-07-02 DIAGNOSIS — I482 Chronic atrial fibrillation, unspecified: Secondary | ICD-10-CM

## 2016-07-02 DIAGNOSIS — I119 Hypertensive heart disease without heart failure: Secondary | ICD-10-CM | POA: Diagnosis not present

## 2016-07-02 DIAGNOSIS — E559 Vitamin D deficiency, unspecified: Secondary | ICD-10-CM | POA: Diagnosis not present

## 2016-07-02 DIAGNOSIS — E039 Hypothyroidism, unspecified: Secondary | ICD-10-CM | POA: Diagnosis not present

## 2016-07-02 DIAGNOSIS — F32A Depression, unspecified: Secondary | ICD-10-CM

## 2016-07-02 DIAGNOSIS — E785 Hyperlipidemia, unspecified: Secondary | ICD-10-CM

## 2016-07-02 DIAGNOSIS — F329 Major depressive disorder, single episode, unspecified: Secondary | ICD-10-CM

## 2016-07-02 DIAGNOSIS — Z1211 Encounter for screening for malignant neoplasm of colon: Secondary | ICD-10-CM | POA: Diagnosis not present

## 2016-07-02 NOTE — Progress Notes (Signed)
Subjective:    Patient ID: Dalton Vaughan, male    DOB: 1929-07-17, 82 y.o.   MRN: [id number]  HPI Pt here for follow up and management of chronic medical problems which includes hypertension, hyperlipidemia, and hypothyroid. He is taking medications regularly.The patient is doing well overall. He does complain of some nasal drainage and early morning cough. He sees a cardiologist regularly. He is not fasting so he will return to the office for his fasting lab work. He did bring in an FOBT today. The patient sees his cardiologist, Dr. Wynonia Lawman every 6 months. He sees the urologist yearly because of a nodular prostate is slightly elevated PSA and does not have prostate cancer. He denies any chest pain or shortness of breath. He does have occasional bouts with atrial fibrillation. He has no trouble with swallowing and does have occasional heartburn secondary to what he is eating. He has not seen any blood in the stool or had any black tarry bowel movements. He is passing his water without problems. He gets his eye exam done every 2 years and his next eye exam is scheduled for March 2018. The patient says his depression is stable and he seems to be in a positive mood today.    Patient Active Problem List   Diagnosis Date Noted  . Hereditary and idiopathic peripheral neuropathy 10/16/2015  . Mononeuropathy 10/04/2015  . Allergic rhinitis 05/06/2014  . Depression 08/03/2013  . Sick sinus syndrome ([place]) 07/31/2012  . Hypertensive heart disease without CHF   . Chronic atrial fibrillation ([place])   . CAD (coronary artery disease)   . History of TIAs   . Lumbar disc disease   . GERD (gastroesophageal reflux disease)   . Long-term (current) use of anticoagulants   . BPH (benign prostatic hypertrophy)   . Hyperlipidemia    Outpatient Encounter Prescriptions as of 07/02/2016  Medication Sig  . amLODipine (NORVASC) 5 MG tablet TAKE ONE-HALF (1/2) TABLET TWICE A DAY  . azelastine (ASTELIN) 137 MCG/SPRAY  nasal spray USE 1 TO 2 SPRAYS IN EACH NOSTRIL TWICE A DAY  . Calcium Carb-Cholecalciferol (CALCIUM + D3) 600-200 MG-UNIT TABS Take 0.25 tablets by mouth daily.  . Cholecalciferol (VITAMIN D-3) 5000 UNITS TABS Take 1 tablet by mouth daily.   Marland Kitchen Co-Enzyme Q10 200 MG CAPS Take 1 tablet by mouth daily.  . CRESTOR 5 MG tablet TAKE 1 TABLET DAILY AS DIRECTED  . doxazosin (CARDURA) 8 MG tablet TAKE 1 TABLET AT BEDTIME  . fluticasone (FLONASE) 50 MCG/ACT nasal spray USE 1 TO 2 SPRAYS IN EACH NOSTRIL DAILY  . levothyroxine (SYNTHROID, LEVOTHROID) 50 MCG tablet Take 1 tablet (50 mcg total) by mouth daily.  . mirtazapine (REMERON) 15 MG tablet TAKE 1 TABLET DAILY AT BEDTIME AS DIRECTED  . Multiple Vitamin (MULTIVITAMIN) tablet Take 1 tablet by mouth daily.    . ramipril (ALTACE) 10 MG capsule TAKE 1 CAPSULE DAILY  . triamterene-hydrochlorothiazide (MAXZIDE-25) 37.5-25 MG tablet TAKE 1 TABLET DAILY  . vitamin C (ASCORBIC ACID) 500 MG tablet Take 500 mg by mouth daily. Take 1/4 tablet by mouth daily.  Marland Kitchen warfarin (COUMADIN) 5 MG tablet TAKE AS DIRECTed   No facility-administered encounter medications on file as of 07/02/2016.      Review of Systems  Constitutional: Negative.   HENT: Positive for postnasal drip.   Eyes: Negative.   Respiratory: Positive for cough (early morning).   Cardiovascular: Negative.   Gastrointestinal: Negative.   Endocrine: Negative.   Genitourinary: Negative.  Musculoskeletal: Negative.   Skin: Negative.   Allergic/Immunologic: Negative.   Neurological: Negative.   Hematological: Negative.   Psychiatric/Behavioral: Negative.        Objective:   Physical Exam  Constitutional: He is oriented to person, place, and time. He appears well-developed and well-nourished. No distress.  The patient is alert and has a positive demeanor  HENT:  Head: Normocephalic and atraumatic.  Right Ear: External ear normal.  Left Ear: External ear normal.  Nose: Nose normal.    Mouth/Throat: Oropharynx is clear and moist. No oropharyngeal exudate.  Eyes: Conjunctivae and EOM are normal. Pupils are equal, round, and reactive to light. Right eye exhibits no discharge. Left eye exhibits no discharge. No scleral icterus.  Neck: Normal range of motion. Neck supple. No thyromegaly present.  No bruits thyromegaly or anterior cervical adenopathy  Cardiovascular: Normal rate, normal heart sounds and intact distal pulses.   No murmur heard. Heart is irregular irregular at 72/m  Pulmonary/Chest: Effort normal and breath sounds normal. No respiratory distress. He has no wheezes. He has no rales. He exhibits no tenderness.  Clear anteriorly and posteriorly  Abdominal: Soft. Bowel sounds are normal. He exhibits no mass. There is no tenderness. There is no rebound and no guarding.  No epigastric tenderness liver or spleen enlargement or inguinal adenopathy or bruits  Genitourinary:  This is followed regularly by his urologist on a yearly basis  Musculoskeletal: Normal range of motion. He exhibits no edema.  Lymphadenopathy:    He has no cervical adenopathy.  Neurological: He is alert and oriented to person, place, and time. He has normal reflexes. No cranial nerve deficit.  Skin: Skin is warm and dry. No rash noted.  Psychiatric: He has a normal mood and affect. His behavior is normal. Judgment and thought content normal.  The patient's mood is positive.  Nursing note and vitals reviewed.   BP 142/84 mmHg  Pulse 64  Temp(Src) 97.7 F (36.5 C) (Oral)  Ht 5' 10"  (1.778 m)  Wt 181 lb (82.101 kg)  BMI 25.97 kg/m2       Assessment & Plan:  1. Hyperlipidemia -Continue with aggressive therapeutic lifestyle changes and current treatment - CBC with Differential/Platelet; Future - Hepatic function panel; Future - NMR, lipoprofile; Future  2. Vitamin D deficiency -Continue with current treatment pending results of lab work - CBC with Differential/Platelet; Future -  VITAMIN D 25 Hydroxy (Vit-D Deficiency, Fractures); Future  3. Hypothyroidism, unspecified hypothyroidism type -Continue with current treatment pending results of lab work - CBC with Differential/Platelet; Future - Thyroid Panel With TSH; Future  4. hypertension -The blood pressure is minimally elevated today and he will continue with current treatment and will watch his sodium intake - CBC with Differential/Platelet; Future - BMP8+EGFR; Future - Hepatic function panel; Future  5. Chronic atrial fibrillation (HCC) -Continue to follow-up with cardiology - CBC with Differential/Platelet; Future  6. Depression -Continue to stay active and interactive with family and stay busy outside  7. Lumbar disc disease -Continue to avoid any heavy lifting pushing pulling etc.  Patient Instructions                       Medicare Annual Wellness Visit  [facility] and the medical providers at [facility] strive to bring you the best medical care.  In doing so we not only want to address your current medical conditions and concerns but also to detect new conditions early and prevent illness, disease  and health-related problems.    Medicare offers a yearly Wellness Visit which allows our clinical staff to assess your need for preventative services including immunizations, lifestyle education, counseling to decrease risk of preventable diseases and screening for fall risk and other medical concerns.    This visit is provided free of charge (no copay) for all Medicare recipients. The clinical pharmacists at [facility] have begun to conduct these Wellness Visits which will also include a thorough review of all your medications.    As you primary medical provider recommend that you make an appointment for your Annual Wellness Visit if you have not done so already this year.  You may set up this appointment before you leave today or you may call back ([phone])  and schedule an appointment.  Please make sure when you call that you mention that you are scheduling your Annual Wellness Visit with the clinical pharmacist so that the appointment may be made for the proper length of time.     Continue current medications. Continue good therapeutic lifestyle changes which include good diet and exercise. Fall precautions discussed with patient. If an FOBT was given today- please return it to our front desk. If you are over 70 years old - you may need Prevnar 34 or the adult Pneumonia vaccine.  **Flu shots are available--- please call and schedule a FLU-CLINIC appointment**  After your visit with [place] today you will receive a survey in the mail or online from [employer] regarding your care with [place]. Please take a moment to fill this out. Your feedback is very important to [place] as you can help [place] better understand your patient needs as well as improve your experience and satisfaction. WE CARE ABOUT YOU!!!   The patient should drink plenty of fluids and stay well hydrated He should continue to use his Flonase nasal spray, saline nose spray, and plain Mucinex maximum strength 1 twice daily for cough and congestion He should follow-up regularly with his urologist and his cardiologist as they request He should return to the office fasting for labwork     Arrie Senate MD

## 2016-07-02 NOTE — Patient Instructions (Addendum)
Medicare Annual Wellness Visit  [facility] and the medical providers at [facility] strive to bring you the best medical care.  In doing so we not only want to address your current medical conditions and concerns but also to detect new conditions early and prevent illness, disease and health-related problems.    Medicare offers a yearly Wellness Visit which allows our clinical staff to assess your need for preventative services including immunizations, lifestyle education, counseling to decrease risk of preventable diseases and screening for fall risk and other medical concerns.    This visit is provided free of charge (no copay) for all Medicare recipients. The clinical pharmacists at [facility] have begun to conduct these Wellness Visits which will also include a thorough review of all your medications.    As you primary medical provider recommend that you make an appointment for your Annual Wellness Visit if you have not done so already this year.  You may set up this appointment before you leave today or you may call back ([phone]) and schedule an appointment.  Please make sure when you call that you mention that you are scheduling your Annual Wellness Visit with the clinical pharmacist so that the appointment may be made for the proper length of time.     Continue current medications. Continue good therapeutic lifestyle changes which include good diet and exercise. Fall precautions discussed with patient. If an FOBT was given today- please return it to our front desk. If you are over 47 years old - you may need Prevnar 58 or the adult Pneumonia vaccine.  **Flu shots are available--- please call and schedule a FLU-CLINIC appointment**  After your visit with [place] today you will receive a survey in the mail or online from [employer] regarding your care with [place]. Please take a moment to fill this out. Your feedback is very  important to [place] as you can help [place] better understand your patient needs as well as improve your experience and satisfaction. WE CARE ABOUT YOU!!!   The patient should drink plenty of fluids and stay well hydrated He should continue to use his Flonase nasal spray, saline nose spray, and plain Mucinex maximum strength 1 twice daily for cough and congestion He should follow-up regularly with his urologist and his cardiologist as they request He should return to the office fasting for labwork

## 2016-07-02 NOTE — Addendum Note (Signed)
Addended by: Zannie Cove on: 07/02/2016 11:09 AM   Modules accepted: Orders

## 2016-07-04 LAB — FECAL OCCULT BLOOD, IMMUNOCHEMICAL: Fecal Occult Bld: NEGATIVE

## 2016-07-05 ENCOUNTER — Telehealth: Payer: Self-pay | Admitting: *Deleted

## 2016-07-05 NOTE — Telephone Encounter (Signed)
Pt notified of results Verbalizes understanding 

## 2016-07-05 NOTE — Telephone Encounter (Signed)
-----   Message from Chipper Herb, MD sent at 07/04/2016  7:37 PM EDT ----- FOBT is negative.

## 2016-07-09 ENCOUNTER — Other Ambulatory Visit: Payer: Medicare Other

## 2016-07-09 ENCOUNTER — Other Ambulatory Visit: Payer: Self-pay | Admitting: Family Medicine

## 2016-07-09 DIAGNOSIS — I482 Chronic atrial fibrillation, unspecified: Secondary | ICD-10-CM

## 2016-07-09 DIAGNOSIS — E559 Vitamin D deficiency, unspecified: Secondary | ICD-10-CM

## 2016-07-09 DIAGNOSIS — E039 Hypothyroidism, unspecified: Secondary | ICD-10-CM | POA: Diagnosis not present

## 2016-07-09 DIAGNOSIS — I119 Hypertensive heart disease without heart failure: Secondary | ICD-10-CM | POA: Diagnosis not present

## 2016-07-09 DIAGNOSIS — E785 Hyperlipidemia, unspecified: Secondary | ICD-10-CM

## 2016-07-10 ENCOUNTER — Encounter: Payer: Self-pay | Admitting: Family Medicine

## 2016-07-10 DIAGNOSIS — D696 Thrombocytopenia, unspecified: Secondary | ICD-10-CM | POA: Insufficient documentation

## 2016-07-10 LAB — CBC WITH DIFFERENTIAL/PLATELET
Basophils Absolute: 0 10*3/uL (ref 0.0–0.2)
Basos: 0 %
EOS (ABSOLUTE): 0.2 10*3/uL (ref 0.0–0.4)
Eos: 3 %
Hematocrit: 40 % (ref 37.5–51.0)
Hemoglobin: 13.3 g/dL (ref 12.6–17.7)
Immature Grans (Abs): 0 10*3/uL (ref 0.0–0.1)
Immature Granulocytes: 0 %
Lymphocytes Absolute: 2.4 10*3/uL (ref 0.7–3.1)
Lymphs: 41 %
MCH: 29.8 pg (ref 26.6–33.0)
MCHC: 33.3 g/dL (ref 31.5–35.7)
MCV: 90 fL (ref 79–97)
Monocytes Absolute: 0.4 10*3/uL (ref 0.1–0.9)
Monocytes: 7 %
Neutrophils Absolute: 2.9 10*3/uL (ref 1.4–7.0)
Neutrophils: 49 %
Platelets: 146 10*3/uL — ABNORMAL LOW (ref 150–379)
RBC: 4.46 x10E6/uL (ref 4.14–5.80)
RDW: 15.9 % — ABNORMAL HIGH (ref 12.3–15.4)
WBC: 6 10*3/uL (ref 3.4–10.8)

## 2016-07-10 LAB — HEPATIC FUNCTION PANEL
ALT: 15 IU/L (ref 0–44)
AST: 17 IU/L (ref 0–40)
Albumin: 4 g/dL (ref 3.5–4.7)
Alkaline Phosphatase: 90 IU/L (ref 39–117)
Bilirubin Total: 0.6 mg/dL (ref 0.0–1.2)
Bilirubin, Direct: 0.16 mg/dL (ref 0.00–0.40)
Total Protein: 6.3 g/dL (ref 6.0–8.5)

## 2016-07-10 LAB — BMP8+EGFR
BUN/Creatinine Ratio: 11 (ref 10–24)
BUN: 9 mg/dL (ref 8–27)
CO2: 28 mmol/L (ref 18–29)
Calcium: 9.1 mg/dL (ref 8.6–10.2)
Chloride: 102 mmol/L (ref 96–106)
Creatinine, Ser: 0.79 mg/dL (ref 0.76–1.27)
GFR calc Af Amer: 95 mL/min/{1.73_m2} (ref >59)
GFR calc non Af Amer: 82 mL/min/{1.73_m2} (ref >59)
Glucose: 90 mg/dL (ref 65–99)
Potassium: 3.6 mmol/L (ref 3.5–5.2)
Sodium: 142 mmol/L (ref 134–144)

## 2016-07-10 LAB — NMR, LIPOPROFILE
Cholesterol: 151 mg/dL (ref 100–199)
HDL Cholesterol by NMR: 46 mg/dL (ref >39)
HDL Particle Number: 28.1 umol/L — ABNORMAL LOW (ref >=30.5)
LDL Particle Number: 1174 nmol/L — ABNORMAL HIGH (ref <1000)
LDL Size: 21 nm (ref >20.5)
LDL-C: 89 mg/dL (ref 0–99)
LP-IR Score: <25 (ref <=45)
Small LDL Particle Number: 482 nmol/L (ref <=527)
Triglycerides by NMR: 78 mg/dL (ref 0–149)

## 2016-07-10 LAB — THYROID PANEL WITH TSH
Free Thyroxine Index: 1.9 (ref 1.2–4.9)
T3 Uptake Ratio: 28 % (ref 24–39)
T4, Total: 6.7 ug/dL (ref 4.5–12.0)
TSH: 3.66 u[IU]/mL (ref 0.450–4.500)

## 2016-07-10 LAB — VITAMIN D 25 HYDROXY (VIT D DEFICIENCY, FRACTURES): Vit D, 25-Hydroxy: 72 ng/mL (ref 30.0–100.0)

## 2016-07-13 ENCOUNTER — Ambulatory Visit (INDEPENDENT_AMBULATORY_CARE_PROVIDER_SITE_OTHER): Payer: Medicare Other | Admitting: Pharmacist Clinician (PhC)/ Clinical Pharmacy Specialist

## 2016-07-13 DIAGNOSIS — I482 Chronic atrial fibrillation, unspecified: Secondary | ICD-10-CM

## 2016-07-13 LAB — COAGUCHEK XS/INR WAIVED
INR: 1.6 — ABNORMAL HIGH (ref 0.9–1.1)
Prothrombin Time: 19.7 s

## 2016-07-13 MED ORDER — RAMIPRIL 10 MG PO CAPS: 10.0000 mg | ORAL_CAPSULE | Freq: Every day | ORAL | 3 refills | Status: DC

## 2016-07-13 NOTE — Patient Instructions (Signed)
Anticoagulation Dose Instructions as of 07/13/2016      Dalton Vaughan Tue Wed Thu Fri Sat   New Dose 2.5 mg 5 mg 2.5 mg 5 mg 2.5 mg 5 mg 2.5 mg    Description   Continue taking warfarin the same way.  INR today is 1.6

## 2016-08-01 ENCOUNTER — Other Ambulatory Visit: Payer: Self-pay | Admitting: Family Medicine

## 2016-08-03 ENCOUNTER — Encounter: Payer: Self-pay | Admitting: Family Medicine

## 2016-08-03 ENCOUNTER — Ambulatory Visit (INDEPENDENT_AMBULATORY_CARE_PROVIDER_SITE_OTHER): Payer: Medicare Other | Admitting: Family Medicine

## 2016-08-03 VITALS — BP 138/86 | HR 77 | Temp 97.5°F | Ht 70.0 in | Wt 181.4 lb

## 2016-08-03 DIAGNOSIS — B029 Zoster without complications: Secondary | ICD-10-CM | POA: Diagnosis not present

## 2016-08-03 MED ORDER — VALACYCLOVIR HCL 1 G PO TABS: 1000.0000 mg | ORAL_TABLET | Freq: Two times a day (BID) | ORAL | 0 refills | Status: DC

## 2016-08-03 NOTE — Progress Notes (Signed)
BP 138/86 (BP Location: Left Arm, Patient Position: Sitting, Cuff Size: Normal)   Pulse 77   Temp 97.5 F (36.4 C) (Oral)   Ht 5' 10"  (1.778 m)   Wt 181 lb 6.4 oz (82.3 kg)   BMI 26.03 kg/m    Subjective:    Patient ID: Dalton Vaughan, male    DOB: March 04, 1929, 82 y.o.   MRN: [id number]  HPI: Dalton Vaughan is a 82 y.o. male presenting on 08/03/2016 for Rash (painful rash on right side of back radiates into abdomen)   HPI Shingles Patient started with pain in his left lower back a few days ago and the pain radiates around into his abdomen on that side. He had one small bump when it started but now it is starting to enlarge and spread. He says it is very tender and he has trouble laying back on it. He has never had this before. He denies any fevers or chills. He denies any drainage from the sites.  Relevant past medical, surgical, family and social history reviewed and updated as indicated. Interim medical history since our last visit reviewed. Allergies and medications reviewed and updated.  Review of Systems  Constitutional: Negative for fever.  HENT: Negative for ear discharge and ear pain.   Eyes: Negative for discharge and visual disturbance.  Respiratory: Negative for shortness of breath and wheezing.   Cardiovascular: Negative for chest pain and leg swelling.  Gastrointestinal: Negative for abdominal pain, constipation and diarrhea.  Genitourinary: Negative for difficulty urinating.  Musculoskeletal: Negative for back pain and gait problem.  Skin: Positive for rash.  Neurological: Negative for syncope, light-headedness and headaches.  All other systems reviewed and are negative.   Per HPI unless specifically indicated above     Medication List       Accurate as of 08/03/16 12:01 PM. Always use your most recent med list.          amLODipine 5 MG tablet Commonly known as:  NORVASC TAKE ONE-HALF (1/2) TABLET TWICE A DAY   Calcium + D3 600-200 MG-UNIT Tabs Take 0.25  tablets by mouth daily.   Co-Enzyme Q10 200 MG Caps Take 1 tablet by mouth daily.   CRESTOR 5 MG tablet Generic drug:  rosuvastatin TAKE 1 TABLET DAILY AS DIRECTED   doxazosin 8 MG tablet Commonly known as:  CARDURA TAKE 1 TABLET AT BEDTIME   fluticasone 50 MCG/ACT nasal spray Commonly known as:  FLONASE USE 1 TO 2 SPRAYS IN EACH NOSTRIL DAILY   levothyroxine 50 MCG tablet Commonly known as:  SYNTHROID, LEVOTHROID Take 1 tablet (50 mcg total) by mouth daily.   mirtazapine 15 MG tablet Commonly known as:  REMERON TAKE 1 TABLET DAILY AT BEDTIME AS DIRECTED   multivitamin tablet Take 1 tablet by mouth daily.   ramipril 10 MG capsule Commonly known as:  ALTACE Take 1 capsule (10 mg total) by mouth daily.   triamterene-hydrochlorothiazide 37.5-25 MG tablet Commonly known as:  MAXZIDE-25 TAKE 1 TABLET DAILY   valACYclovir 1000 MG tablet Commonly known as:  VALTREX Take 1 tablet (1,000 mg total) by mouth 2 (two) times daily.   vitamin C 500 MG tablet Commonly known as:  ASCORBIC ACID Take 500 mg by mouth daily. Take 1/4 tablet by mouth daily.   Vitamin D-3 5000 UNITS Tabs Take 1 tablet by mouth daily.   warfarin 5 MG tablet Commonly known as:  COUMADIN TAKE AS DIRECTed          Objective:  BP 138/86 (BP Location: Left Arm, Patient Position: Sitting, Cuff Size: Normal)   Pulse 77   Temp 97.5 F (36.4 C) (Oral)   Ht 5' 10"  (1.778 m)   Wt 181 lb 6.4 oz (82.3 kg)   BMI 26.03 kg/m   Wt Readings from Last 3 Encounters:  08/03/16 181 lb 6.4 oz (82.3 kg)  07/02/16 181 lb (82.1 kg)  03/08/16 185 lb (83.9 kg)    Physical Exam  Constitutional: He is oriented to person, place, and time. He appears well-developed and well-nourished. No distress.  Eyes: Conjunctivae and EOM are normal. Pupils are equal, round, and reactive to light. Right eye exhibits no discharge. No scleral icterus.  Neck: Neck supple. No thyromegaly present.  Cardiovascular: Normal rate,  regular rhythm, normal heart sounds and intact distal pulses.   No murmur heard. Pulmonary/Chest: Effort normal and breath sounds normal. No respiratory distress. He has no wheezes.  Musculoskeletal: Normal range of motion. He exhibits no edema.  Lymphadenopathy:    He has no cervical adenopathy.  Neurological: He is alert and oriented to person, place, and time. Coordination normal.  Skin: Skin is warm and dry. Rash noted. Rash is vesicular (A vesicular rash with surrounding inflammation on his left lumbar region around T12 dermatome. Rash does not go anywhere else). He is not diaphoretic.  Psychiatric: He has a normal mood and affect. His behavior is normal.  Nursing note and vitals reviewed.      Assessment & Plan:   Problem List Items Addressed This Visit    None    Visit Diagnoses    Shingles rash    -  Primary   Relevant Medications   valACYclovir (VALTREX) 1000 MG tablet       Follow up plan: Return if symptoms worsen or fail to improve.  Counseling provided for all of the vaccine components No orders of the defined types were placed in this encounter.   Caryl Pina, MD [facility] 08/03/2016, 12:01 PM

## 2016-08-24 ENCOUNTER — Ambulatory Visit (INDEPENDENT_AMBULATORY_CARE_PROVIDER_SITE_OTHER): Payer: Medicare Other | Admitting: Pharmacist Clinician (PhC)/ Clinical Pharmacy Specialist

## 2016-08-24 DIAGNOSIS — I482 Chronic atrial fibrillation, unspecified: Secondary | ICD-10-CM

## 2016-08-24 LAB — COAGUCHEK XS/INR WAIVED
INR: 1.7 — ABNORMAL HIGH (ref 0.9–1.1)
Prothrombin Time: 20.4 s

## 2016-08-24 NOTE — Patient Instructions (Signed)
Anticoagulation Dose Instructions as of 08/24/2016      [name] Tue Wed Thu Fri Sat   New Dose 2.5 mg 5 mg 2.5 mg 5 mg 2.5 mg 5 mg 2.5 mg    Description   Continue taking warfarin the same way.  INR today is 1.7

## 2016-09-22 ENCOUNTER — Other Ambulatory Visit: Payer: Self-pay | Admitting: Family Medicine

## 2016-10-05 ENCOUNTER — Encounter: Payer: Self-pay | Admitting: Pharmacist Clinician (PhC)/ Clinical Pharmacy Specialist

## 2016-10-05 DIAGNOSIS — I495 Sick sinus syndrome: Secondary | ICD-10-CM | POA: Diagnosis not present

## 2016-10-05 DIAGNOSIS — I119 Hypertensive heart disease without heart failure: Secondary | ICD-10-CM | POA: Diagnosis not present

## 2016-10-05 DIAGNOSIS — I2510 Atherosclerotic heart disease of native coronary artery without angina pectoris: Secondary | ICD-10-CM | POA: Diagnosis not present

## 2016-10-05 DIAGNOSIS — Z7901 Long term (current) use of anticoagulants: Secondary | ICD-10-CM | POA: Diagnosis not present

## 2016-10-05 DIAGNOSIS — Z951 Presence of aortocoronary bypass graft: Secondary | ICD-10-CM | POA: Diagnosis not present

## 2016-10-05 DIAGNOSIS — Z8673 Personal history of transient ischemic attack (TIA), and cerebral infarction without residual deficits: Secondary | ICD-10-CM | POA: Diagnosis not present

## 2016-10-05 DIAGNOSIS — I482 Chronic atrial fibrillation: Secondary | ICD-10-CM | POA: Diagnosis not present

## 2016-10-05 DIAGNOSIS — E785 Hyperlipidemia, unspecified: Secondary | ICD-10-CM | POA: Diagnosis not present

## 2016-10-12 ENCOUNTER — Ambulatory Visit (INDEPENDENT_AMBULATORY_CARE_PROVIDER_SITE_OTHER): Payer: Medicare Other | Admitting: Pharmacist Clinician (PhC)/ Clinical Pharmacy Specialist

## 2016-10-12 DIAGNOSIS — I482 Chronic atrial fibrillation, unspecified: Secondary | ICD-10-CM

## 2016-10-12 LAB — COAGUCHEK XS/INR WAIVED
INR: 1.6 — ABNORMAL HIGH (ref 0.9–1.1)
Prothrombin Time: 18.7 s

## 2016-10-12 MED ORDER — TRIAMTERENE-HCTZ 37.5-25 MG PO TABS: 1.0000 | ORAL_TABLET | Freq: Every day | ORAL | 3 refills | Status: DC

## 2016-10-12 NOTE — Patient Instructions (Signed)
Anticoagulation Dose Instructions as of 10/12/2016      Dalton Vaughan Tue Wed Thu Fri Sat   New Dose 2.5 mg 5 mg 2.5 mg 5 mg 2.5 mg 5 mg 2.5 mg    Description   Continue taking warfarin the same way.  INR today is 1.6

## 2016-11-05 ENCOUNTER — Encounter: Payer: Self-pay | Admitting: Family Medicine

## 2016-11-05 ENCOUNTER — Ambulatory Visit (INDEPENDENT_AMBULATORY_CARE_PROVIDER_SITE_OTHER): Payer: Medicare Other | Admitting: Family Medicine

## 2016-11-05 ENCOUNTER — Ambulatory Visit (INDEPENDENT_AMBULATORY_CARE_PROVIDER_SITE_OTHER): Payer: Medicare Other

## 2016-11-05 VITALS — BP 137/72 | HR 76 | Temp 96.6°F | Ht 70.0 in | Wt 182.0 lb

## 2016-11-05 DIAGNOSIS — E7800 Pure hypercholesterolemia, unspecified: Secondary | ICD-10-CM

## 2016-11-05 DIAGNOSIS — I25810 Atherosclerosis of coronary artery bypass graft(s) without angina pectoris: Secondary | ICD-10-CM | POA: Diagnosis not present

## 2016-11-05 DIAGNOSIS — K6289 Other specified diseases of anus and rectum: Secondary | ICD-10-CM

## 2016-11-05 DIAGNOSIS — D696 Thrombocytopenia, unspecified: Secondary | ICD-10-CM

## 2016-11-05 DIAGNOSIS — I482 Chronic atrial fibrillation, unspecified: Secondary | ICD-10-CM

## 2016-11-05 DIAGNOSIS — M5440 Lumbago with sciatica, unspecified side: Secondary | ICD-10-CM

## 2016-11-05 DIAGNOSIS — E559 Vitamin D deficiency, unspecified: Secondary | ICD-10-CM

## 2016-11-05 DIAGNOSIS — L570 Actinic keratosis: Secondary | ICD-10-CM | POA: Diagnosis not present

## 2016-11-05 DIAGNOSIS — I119 Hypertensive heart disease without heart failure: Secondary | ICD-10-CM | POA: Diagnosis not present

## 2016-11-05 DIAGNOSIS — Z23 Encounter for immunization: Secondary | ICD-10-CM | POA: Diagnosis not present

## 2016-11-05 DIAGNOSIS — G8929 Other chronic pain: Secondary | ICD-10-CM

## 2016-11-05 NOTE — Progress Notes (Signed)
Subjective:    Patient ID: Dalton Vaughan, male    DOB: [birth year], 82 y.o.   MRN: [id number]  HPI Pt here for follow up and management of chronic medical problems which includes hypertension, hyperlipidemia and a fib. He is taking medications regularly.The patient is doing well overall. He does complain today of some skin lesions on his face and a sensation that his left ear has fluid in it. He will return to the office for his fasting lab work and will get his flu shot today. The patient denies any chest pain pressure or palpitations. He did recently see Dr. Wynonia Lawman and he said things were good with his heart. He denies any shortness of breath. He does have some problems swallowing and the food does not want to go down well and this seems to be getting some worse but not at the point of going to see the gastroenterologist and having a dilatation. He will keep [place] posted on this. He has not seen any blood in the stool or had any black tarry bowel movements. His bowel habits are stable. He does have more frequency and urgency and a slower less forceful stream as far as passing his water is concerned. The patient does complain of an irritation of his left eardrum as mentioned. He also has ear cerumen in the right ear canal. He has a couple of lesions one on his left TMJ area and the other one on his scalp that he is concerned about and these appear to be actinic keratoses which we will do cryotherapy.     Patient Active Problem List   Diagnosis Date Noted  . Thrombocytopenia ([place]) 07/10/2016  . Hereditary and idiopathic peripheral neuropathy 10/16/2015  . Mononeuropathy 10/04/2015  . Allergic rhinitis 05/06/2014  . Depression 08/03/2013  . Sick sinus syndrome ([place]) 07/31/2012  . Hypertensive heart disease without CHF   . Chronic atrial fibrillation ([place])   . CAD (coronary artery disease)   . History of TIAs   . Lumbar disc disease   . GERD (gastroesophageal reflux disease)   . Long-term (current)  use of anticoagulants   . BPH (benign prostatic hypertrophy)   . Hyperlipidemia    Outpatient Encounter Prescriptions as of 11/05/2016  Medication Sig  . amLODipine (NORVASC) 5 MG tablet TAKE ONE-HALF (1/2) TABLET TWICE A DAY  . Calcium Carb-Cholecalciferol (CALCIUM + D3) 600-200 MG-UNIT TABS Take 0.25 tablets by mouth daily.  . Cholecalciferol (VITAMIN D-3) 5000 UNITS TABS Take 1 tablet by mouth daily.   Marland Kitchen Co-Enzyme Q10 200 MG CAPS Take 1 tablet by mouth daily.  Marland Kitchen doxazosin (CARDURA) 8 MG tablet TAKE 1 TABLET AT BEDTIME  . levothyroxine (SYNTHROID, LEVOTHROID) 50 MCG tablet Take 1 tablet (50 mcg total) by mouth daily.  . mirtazapine (REMERON) 15 MG tablet TAKE 1 TABLET DAILY AT BEDTIME AS DIRECTED  . Multiple Vitamin (MULTIVITAMIN) tablet Take 1 tablet by mouth daily.    . ramipril (ALTACE) 10 MG capsule Take 1 capsule (10 mg total) by mouth daily.  . rosuvastatin (CRESTOR) 5 MG tablet TAKE 1 TABLET DAILY AS DIRECTED  . triamterene-hydrochlorothiazide (MAXZIDE-25) 37.5-25 MG tablet Take 1 tablet by mouth daily.  . vitamin C (ASCORBIC ACID) 500 MG tablet Take 500 mg by mouth daily. Take 1/4 tablet by mouth daily.  Marland Kitchen warfarin (COUMADIN) 5 MG tablet TAKE AS DIRECTED  . [DISCONTINUED] fluticasone (FLONASE) 50 MCG/ACT nasal spray USE 1 TO 2 SPRAYS IN EACH NOSTRIL DAILY  . [DISCONTINUED] valACYclovir (VALTREX) 1000 MG  tablet Take 1 tablet (1,000 mg total) by mouth 2 (two) times daily.   No facility-administered encounter medications on file as of 11/05/2016.       Review of Systems  Constitutional: Negative.   HENT: Positive for ear pain (left - fluid?).   Eyes: Negative.   Respiratory: Negative.   Cardiovascular: Negative.   Gastrointestinal: Negative.   Endocrine: Negative.   Genitourinary: Negative.   Musculoskeletal: Negative.   Skin: Negative.        Skin lesions - face  Allergic/Immunologic: Negative.   Neurological: Negative.   Hematological: Negative.     Psychiatric/Behavioral: Negative.        Objective:   Physical Exam  Constitutional: He is oriented to person, place, and time. He appears well-developed and well-nourished. No distress.  HENT:  Head: Normocephalic and atraumatic.  Nose: Nose normal.  Mouth/Throat: Oropharynx is clear and moist. No oropharyngeal exudate.  Cerumen right ear canal and tiny hair touching the left  Eyes: Conjunctivae and EOM are normal. Pupils are equal, round, and reactive to light. Right eye exhibits no discharge. Left eye exhibits no discharge. No scleral icterus.  Neck: Normal range of motion. Neck supple. No thyromegaly present.  No bruits thyromegaly or anterior cervical adenopathy  Cardiovascular: Normal rate, normal heart sounds and intact distal pulses.   No murmur heard. The heart is irregular irregular at 60/m  Pulmonary/Chest: Effort normal and breath sounds normal. No respiratory distress. He has no wheezes. He has no rales. He exhibits no tenderness.  Clear anteriorly and posteriorly and no axillary adenopathy  Abdominal: Soft. Bowel sounds are normal. He exhibits no mass. There is no tenderness. There is no rebound and no guarding.  No abdominal tenderness or organ enlargement bruits or inguinal adenopathy  Musculoskeletal: Normal range of motion. He exhibits no edema.  Lymphadenopathy:    He has no cervical adenopathy.  Neurological: He is alert and oriented to person, place, and time. He has normal reflexes. No cranial nerve deficit.  Skin: Skin is warm and dry. No rash noted.  AK of scalp and AK of left TM joint area on face.  Psychiatric: He has a normal mood and affect. His behavior is normal. Judgment and thought content normal.  Nursing note and vitals reviewed.   BP 137/72 (BP Location: Left Arm)   Pulse 76   Temp (!) 96.6 F (35.9 C) (Oral)   Ht 5' 10" (1.778 m)   Wt 182 lb (82.6 kg)   BMI 26.11 kg/m        Assessment & Plan:  1. Chronic atrial fibrillation  (HCC) -Continue with Coumadin and follow-up with cardiology - CBC with Differential/Platelet; Future  2. Pure hypercholesterolemia -Continue aggressive therapeutic lifestyle changes and Crestor - CBC with Differential/Platelet; Future - NMR, lipoprofile; Future  3. Vitamin D deficiency -Continue with vitamin D replacement pending results of lab work - CBC with Differential/Platelet; Future - VITAMIN D 25 Hydroxy (Vit-D Deficiency, Fractures); Future  4. hypertension -The blood pressure is good today and he will continue with current treatment - BMP8+EGFR; Future - CBC with Differential/Platelet; Future - Hepatic function panel; Future  5. Chronic rectal pain -This seems to have subsided somewhat and he is had a thorough workup and evaluation from the orthopedist the urologist and the neurologist.  6. Thrombocytopenia ([place]) -No bleeding tendencies or issues today.  7. Atherosclerosis of CABG w/o angina pectoris -Continue aggressive therapeutic lifestyle changes and follow-up with cardiology  8. Actinic keratoses -Cryotherapy to scalp lesion and  to the left TM joint lesion  9. Low back pain with sciatica, sciatica laterality unspecified, unspecified back pain laterality, u - DG Lumbar Spine 2-3 Views; Future  No orders of the defined types were placed in this encounter.  Patient Instructions                       Medicare Annual Wellness Visit  [facility] and the medical providers at [facility] strive to bring you the best medical care.  In doing so we not only want to address your current medical conditions and concerns but also to detect new conditions early and prevent illness, disease and health-related problems.    Medicare offers a yearly Wellness Visit which allows our clinical staff to assess your need for preventative services including immunizations, lifestyle education, counseling to decrease risk of preventable diseases and screening for  fall risk and other medical concerns.    This visit is provided free of charge (no copay) for all Medicare recipients. The clinical pharmacists at [facility] have begun to conduct these Wellness Visits which will also include a thorough review of all your medications.    As you primary medical provider recommend that you make an appointment for your Annual Wellness Visit if you have not done so already this year.  You may set up this appointment before you leave today or you may call back ([phone]) and schedule an appointment.  Please make sure when you call that you mention that you are scheduling your Annual Wellness Visit with the clinical pharmacist so that the appointment may be made for the proper length of time.     Continue current medications. Continue good therapeutic lifestyle changes which include good diet and exercise. Fall precautions discussed with patient. If an FOBT was given today- please return it to our front desk. If you are over 70 years old - you may need Prevnar 24 or the adult Pneumonia vaccine.  **Flu shots are available--- please call and schedule a FLU-CLINIC appointment**  After your visit with [place] today you will receive a survey in the mail or online from [employer] regarding your care with [place]. Please take a moment to fill this out. Your feedback is very important to [place] as you can help [place] better understand your patient needs as well as improve your experience and satisfaction. WE CARE ABOUT YOU!!!  Flu shot that he received today may make your arm sore We will call you with lab results as soon as those results become available    Arrie Senate MD

## 2016-11-05 NOTE — Patient Instructions (Addendum)
Medicare Annual Wellness Visit  [facility] and the medical providers at [facility] strive to bring you the best medical care.  In doing so we not only want to address your current medical conditions and concerns but also to detect new conditions early and prevent illness, disease and health-related problems.    Medicare offers a yearly Wellness Visit which allows our clinical staff to assess your need for preventative services including immunizations, lifestyle education, counseling to decrease risk of preventable diseases and screening for fall risk and other medical concerns.    This visit is provided free of charge (no copay) for all Medicare recipients. The clinical pharmacists at [facility] have begun to conduct these Wellness Visits which will also include a thorough review of all your medications.    As you primary medical provider recommend that you make an appointment for your Annual Wellness Visit if you have not done so already this year.  You may set up this appointment before you leave today or you may call back ([phone]) and schedule an appointment.  Please make sure when you call that you mention that you are scheduling your Annual Wellness Visit with the clinical pharmacist so that the appointment may be made for the proper length of time.     Continue current medications. Continue good therapeutic lifestyle changes which include good diet and exercise. Fall precautions discussed with patient. If an FOBT was given today- please return it to our front desk. If you are over 32 years old - you may need Prevnar 77 or the adult Pneumonia vaccine.  **Flu shots are available--- please call and schedule a FLU-CLINIC appointment**  After your visit with [place] today you will receive a survey in the mail or online from [employer] regarding your care with [place]. Please take a moment to fill this out. Your feedback is very  important to [place] as you can help [place] better understand your patient needs as well as improve your experience and satisfaction. WE CARE ABOUT YOU!!!  Flu shot that he received today may make your arm sore We will call you with lab results as soon as those results become available

## 2016-11-16 ENCOUNTER — Ambulatory Visit (INDEPENDENT_AMBULATORY_CARE_PROVIDER_SITE_OTHER): Payer: Medicare Other | Admitting: Family Medicine

## 2016-11-16 ENCOUNTER — Encounter: Payer: Self-pay | Admitting: Family Medicine

## 2016-11-16 VITALS — BP 128/75 | HR 77 | Temp 97.0°F | Ht 70.0 in | Wt 184.6 lb

## 2016-11-16 DIAGNOSIS — I25810 Atherosclerosis of coronary artery bypass graft(s) without angina pectoris: Secondary | ICD-10-CM | POA: Diagnosis not present

## 2016-11-16 DIAGNOSIS — R300 Dysuria: Secondary | ICD-10-CM

## 2016-11-16 LAB — URINALYSIS
Bilirubin, UA: NEGATIVE
Glucose, UA: NEGATIVE
Leukocytes, UA: NEGATIVE
Nitrite, UA: NEGATIVE
Protein, UA: NEGATIVE
RBC, UA: NEGATIVE
Specific Gravity, UA: 1.015 (ref 1.005–1.030)
Urobilinogen, Ur: 0.2 mg/dL (ref 0.2–1.0)
pH, UA: 6.5 (ref 5.0–7.5)

## 2016-11-16 MED ORDER — CIPROFLOXACIN HCL 250 MG PO TABS: 250.0000 mg | ORAL_TABLET | Freq: Two times a day (BID) | ORAL | 0 refills | Status: DC

## 2016-11-16 NOTE — Progress Notes (Signed)
   HPI  Patient presents today here with concern for UTI.  Patient's lines of the last 3-4 days he's had dysuria and urinary frequency. He denies any bleeding, foul-smelling urine, or change in urine color. He has mild increase of low back pain, heart reason not sure this is associated.  He denies any fevers, chills, sweats, or difficulty tolerating foods or fluids.  It has been several years that he has had a UTI similar to this symptoms previously.  PMH: Smoking status noted ROS: Per HPI  Objective: BP 128/75   Pulse 77   Temp 97 F (36.1 C) (Oral)   Ht 5' 10"  (1.778 m)   Wt 184 lb 9.6 oz (83.7 kg)   BMI 26.49 kg/m  Gen: NAD, alert, cooperative with exam HEENT: NCAT CV: RRR, good S1/S2, no murmur Resp: CTABL, no wheezes, non-labored Abd: SNTND, BS present, no guarding or organomegaly and no CVA tenderness, no suprapubic tenderness Ext: No edema, warm Neuro: Alert and oriented, No gross deficits  Assessment and plan:  # Dysuria Possible UTI given his symptoms, given age we'll go ahead and treat with Cipro, culture to follow-up if this really is an infection or not Given only 3 days of Cipro preliminarily, this will also and right with his Coumadin. I recommended a one-week INR check. Previously 1.6, I expect that this will make him slightly thinner    Orders Placed This Encounter  Procedures  . Urine culture  . Urinalysis    Meds ordered this encounter  Medications  . ciprofloxacin (CIPRO) 250 MG tablet    Sig: Take 1 tablet (250 mg total) by mouth 2 (two) times daily.    Dispense:  6 tablet    Refill:  0    Laroy Apple, MD [facility] 11/16/2016, 5:42 PM

## 2016-11-16 NOTE — Patient Instructions (Signed)
Great to see you!  Come back next week for an INR check  Finish all antibiotics We will all with results of the culture   Urinary Tract Infection, Adult A urinary tract infection (UTI) is an infection of any part of the urinary tract, which includes the kidneys, ureters, bladder, and urethra. These organs make, store, and get rid of urine in the body. UTI can be a bladder infection (cystitis) or kidney infection (pyelonephritis). What are the causes? This infection may be caused by fungi, viruses, or bacteria. Bacteria are the most common cause of UTIs. This condition can also be caused by repeated incomplete emptying of the bladder during urination. What increases the risk? This condition is more likely to develop if:  You ignore your need to urinate or hold urine for long periods of time.  You do not empty your bladder completely during urination.  You wipe back to front after urinating or having a bowel movement, if you are male.  You are uncircumcised, if you are male.  You are constipated.  You have a urinary catheter that stays in place (indwelling).  You have a weak defense (immune) system.  You have a medical condition that affects your bowels, kidneys, or bladder.  You have diabetes.  You take antibiotic medicines frequently or for long periods of time, and the antibiotics no longer work well against certain types of infections (antibiotic resistance).  You take medicines that irritate your urinary tract.  You are exposed to chemicals that irritate your urinary tract.  You are male. What are the signs or symptoms? Symptoms of this condition include:  Fever.  Frequent urination or passing small amounts of urine frequently.  Needing to urinate urgently.  Pain or burning with urination.  Urine that smells bad or unusual.  Cloudy urine.  Pain in the lower abdomen or back.  Trouble urinating.  Blood in the urine.  Vomiting or being less hungry than  normal.  Diarrhea or abdominal pain.  Vaginal discharge, if you are male. How is this diagnosed? This condition is diagnosed with a medical history and physical exam. You will also need to provide a urine sample to test your urine. Other tests may be done, including:  Blood tests.  Sexually transmitted disease (STD) testing. If you have had more than one UTI, a cystoscopy or imaging studies may be done to determine the cause of the infections. How is this treated? Treatment for this condition often includes a combination of two or more of the following:  Antibiotic medicine.  Other medicines to treat less common causes of UTI.  Over-the-counter medicines to treat pain.  Drinking enough water to stay hydrated. Follow these instructions at home:  Take over-the-counter and prescription medicines only as told by your health care provider.  If you were prescribed an antibiotic, take it as told by your health care provider. Do not stop taking the antibiotic even if you start to feel better.  Avoid alcohol, caffeine, tea, and carbonated beverages. They can irritate your bladder.  Drink enough fluid to keep your urine clear or pale yellow.  Keep all follow-up visits as told by your health care provider. This is important.  Make sure to:  Empty your bladder often and completely. Do not hold urine for long periods of time.  Empty your bladder before and after sex.  Wipe from front to back after a bowel movement if you are male. Use each tissue one time when you wipe. Contact a health care  provider if:  You have back pain.  You have a fever.  You feel nauseous or vomit.  Your symptoms do not get better after 3 days.  Your symptoms go away and then return. Get help right away if:  You have severe back pain or lower abdominal pain.  You are vomiting and cannot keep down any medicines or water. This information is not intended to replace advice given to you by your  health care provider. Make sure you discuss any questions you have with your health care provider. Document Released: 09/12/2005 Document Revised: 05/16/2016 Document Reviewed: 10/24/2015 Elsevier Interactive Patient Education  2017 [employer].

## 2016-11-17 LAB — URINE CULTURE: Organism ID, Bacteria: NO GROWTH

## 2016-11-23 ENCOUNTER — Ambulatory Visit (INDEPENDENT_AMBULATORY_CARE_PROVIDER_SITE_OTHER): Payer: Medicare Other | Admitting: Pharmacist Clinician (PhC)/ Clinical Pharmacy Specialist

## 2016-11-23 DIAGNOSIS — I25810 Atherosclerosis of coronary artery bypass graft(s) without angina pectoris: Secondary | ICD-10-CM | POA: Diagnosis not present

## 2016-11-23 DIAGNOSIS — I482 Chronic atrial fibrillation, unspecified: Secondary | ICD-10-CM

## 2016-11-23 LAB — COAGUCHEK XS/INR WAIVED
INR: 1.5 — ABNORMAL HIGH (ref 0.9–1.1)
Prothrombin Time: 18.3 s

## 2016-11-23 NOTE — Patient Instructions (Signed)
Anticoagulation Dose Instructions as of 11/23/2016      Dalton Vaughan Tue Wed Thu Fri Sat   New Dose 2.5 mg 5 mg 2.5 mg 5 mg 2.5 mg 5 mg 2.5 mg    Description   Continue taking warfarin the same way.  INR today is 1.5

## 2016-12-23 ENCOUNTER — Other Ambulatory Visit: Payer: Self-pay | Admitting: Family Medicine

## 2017-01-11 ENCOUNTER — Ambulatory Visit (INDEPENDENT_AMBULATORY_CARE_PROVIDER_SITE_OTHER): Payer: Medicare Other | Admitting: Pharmacist

## 2017-01-11 DIAGNOSIS — I482 Chronic atrial fibrillation, unspecified: Secondary | ICD-10-CM

## 2017-01-11 LAB — COAGUCHEK XS/INR WAIVED
INR: 1.5 — ABNORMAL HIGH (ref 0.9–1.1)
Prothrombin Time: 17.9 s

## 2017-01-11 NOTE — Patient Instructions (Signed)
Anticoagulation Dose Instructions as of 01/11/2017      Dalton Vaughan Tue Wed Thu Fri Sat   New Dose 2.5 mg 5 mg 2.5 mg 5 mg 2.5 mg 5 mg 2.5 mg    Description   Continue taking warfarin the same way.  INR today is 1.5

## 2017-01-21 DIAGNOSIS — R102 Pelvic and perineal pain: Secondary | ICD-10-CM | POA: Diagnosis not present

## 2017-01-21 DIAGNOSIS — R300 Dysuria: Secondary | ICD-10-CM | POA: Diagnosis not present

## 2017-01-22 ENCOUNTER — Other Ambulatory Visit: Payer: Self-pay | Admitting: Family Medicine

## 2017-01-28 ENCOUNTER — Other Ambulatory Visit: Payer: Self-pay | Admitting: Family Medicine

## 2017-02-22 ENCOUNTER — Ambulatory Visit (INDEPENDENT_AMBULATORY_CARE_PROVIDER_SITE_OTHER): Payer: Medicare Other | Admitting: Pharmacist

## 2017-02-22 DIAGNOSIS — I482 Chronic atrial fibrillation, unspecified: Secondary | ICD-10-CM

## 2017-02-22 LAB — COAGUCHEK XS/INR WAIVED
INR: 1.6 — ABNORMAL HIGH (ref 0.9–1.1)
Prothrombin Time: 18.7 s

## 2017-02-22 NOTE — Patient Instructions (Signed)
Anticoagulation Dose Instructions as of 02/22/2017      Dalton Vaughan Tue Wed Thu Fri Sat   New Dose 2.5 mg 5 mg 2.5 mg 5 mg 2.5 mg 5 mg 2.5 mg    Description   Continue taking warfarin the same way.  INR today is 1.6 (goal: 1.5-2.0)

## 2017-03-05 ENCOUNTER — Encounter: Payer: Self-pay | Admitting: Family Medicine

## 2017-03-05 ENCOUNTER — Ambulatory Visit (INDEPENDENT_AMBULATORY_CARE_PROVIDER_SITE_OTHER): Payer: Medicare Other | Admitting: Family Medicine

## 2017-03-05 ENCOUNTER — Ambulatory Visit (HOSPITAL_COMMUNITY)
Admission: RE | Admit: 2017-03-05 | Discharge: 2017-03-05 | Disposition: A | Discharge status: Home / Self Care | Payer: Medicare Other | Source: Ambulatory Visit | Attending: Family Medicine | Admitting: Family Medicine

## 2017-03-05 VITALS — BP 162/79 | HR 60 | Temp 98.2°F | Ht 70.0 in | Wt 184.8 lb

## 2017-03-05 DIAGNOSIS — G459 Transient cerebral ischemic attack, unspecified: Secondary | ICD-10-CM

## 2017-03-05 DIAGNOSIS — I6782 Cerebral ischemia: Secondary | ICD-10-CM | POA: Diagnosis not present

## 2017-03-05 NOTE — Progress Notes (Signed)
   HPI  Patient presents today here in the right lower extremity weakness.  Patient's lines remain 8:30 this morning he was walking to his mailbox when he felt like he "stepped in [place]" with his right foot. His foot continued to feel heavy and weak and began to worsen until he made it to the mailbox, approximately 100 feet and then back to his house to sit down. He then developed right upper extremity weakness and discoordination for short time which then resolved.  Patient has elevated blood pressure today, he states that this is a usual reaction to any kind of anxiety for him.  He's taking all of his medications including 1 aspirin today.  He is on Coumadin for A. fib. He states that he's had TIAs 2 previously. He goes to [facility] neurology.  PMH: Smoking status noted ROS: Per HPI  Objective: BP (!) 162/79 (BP Location: Left Arm, Patient Position: Sitting, Cuff Size: Normal)   Pulse 60   Temp 98.2 F (36.8 C) (Oral)   Ht 5' 10"  (1.778 m)   Wt 184 lb 12.8 oz (83.8 kg)   BMI 26.52 kg/m  Gen: NAD, alert, cooperative with exam HEENT: NCAT CV: RRR, good S1/S2, no murmur Resp: CTABL, no wheezes, non-labored Ext: No edema, warm Neuro: Alert and oriented, strength 5/5 and sensation intact in all 4 extremities, cranial nerves II through XII intact  Assessment and plan:  # TIA Symptoms consistent with transient ischemia. Patient with positive history of TIA Symptoms resolved currently, discussed red flags and reasons to seek emergency medical care. Sent for stat MRI to rule out CVA. Continue Coumadin, statin, ACE inhibitor. Blood pressure elevated currently, could consider this permissive hypertension, however elevated due to anxiety is most likely. No changes.  Patient with back pain, however no worse than usual. No sciatica symptoms.  Recommend follow-up with neurology in 2-3 days. FAST handout given      Orders Placed This Encounter  Procedures  . MR Brain Wo  Contrast    Standing Status:   Future    Standing Expiration Date:   05/05/2018    Order Specific Question:   Reason for Exam (SYMPTOM  OR DIAGNOSIS REQUIRED)    Answer:   TIA, RLE and RUE weakness and discoordination, now resolved    Order Specific Question:   What is the patient's sedation requirement?    Answer:   No Sedation    Order Specific Question:   Does the patient have a pacemaker or implanted devices?    Answer:   No    Order Specific Question:   Preferred imaging location?    Answer:   [facility] (table limit-500 lbs)    Order Specific Question:   Call Results- Best Contact Number?    Answer:   [phone], hold pt    Order Specific Question:   Radiology Contrast Protocol - do NOT remove file path    Answer:   [email or web address]     Laroy Apple, MD [facility] 03/05/2017, 1:05 PM

## 2017-03-05 NOTE — Patient Instructions (Signed)
Great to see you!  We are working on the MRI  We will work on getting you a neurology follow up as soon as possible.    Transient Ischemic Attack A transient ischemic attack (TIA) is a "warning stroke" that causes stroke-like symptoms. A TIA does not cause lasting damage to the brain. The symptoms of a TIA can happen fast and do not last long. It is important to know the symptoms of a TIA and what to do. This can help prevent stroke or death. Follow these instructions at home:  Take medicines only as told by your doctor. Make sure you understand all of the instructions.  You may need to take aspirin or warfarin medicine. Warfarin needs to be taken exactly as told.  Taking too much or too little warfarin is dangerous. Blood tests must be done as often as told by your doctor. A PT blood test measures how long it takes for blood to clot. Your PT is used to calculate another value called an INR. Your PT and INR help your doctor adjust your warfarin dosage. He or she will make sure you are taking the right amount.  Food can cause problems with warfarin and affect the results of your blood tests. This is true for foods high in vitamin K. Eat the same amount of foods high in vitamin K each day. Foods high in vitamin K include spinach, kale, broccoli, cabbage, collard and turnip greens, Brussels sprouts, peas, cauliflower, seaweed, and parsley. Other foods high in vitamin K include beef and pork liver, green tea, and soybean oil. Eat the same amount of foods high in vitamin K each day. Avoid big changes in your diet. Tell your doctor before changing your diet. Talk to a food specialist (dietitian) if you have questions.  Many medicines can cause problems with warfarin and affect your PT and INR. Tell your doctor about all medicines you take. This includes vitamins and dietary pills (supplements). Do not take or stop taking any prescribed or over-the-counter medicines unless your doctor tells you  to.  Warfarin can cause more bruising or bleeding. Hold pressure over any cuts for longer than normal. Talk to your doctor about other side effects of warfarin.  Avoid sports or activities that may cause injury or bleeding.  Be careful when you shave, floss, or use sharp objects.  Avoid or drink very little alcohol while taking warfarin. Tell your doctor if you change how much alcohol you drink.  Tell your dentist and other doctors that you take warfarin before any procedures.  Follow your diet program as told, if you are given one.  Keep a healthy weight.  Stay active. Try to get at least 30 minutes of activity on all or most days.  Do not use any tobacco products, including cigarettes, chewing tobacco, or electronic cigarettes. If you need help quitting, ask your doctor.  Limit alcohol intake to no more than 1 drink per day for nonpregnant women and 2 drinks per day for men. One drink equals 12 ounces of beer, 5 ounces of wine, or 1 ounces of hard liquor.  Do not abuse drugs.  Keep your home safe so you do not fall. You can do this by:  Putting grab bars in the bedroom and bathroom.  Raising toilet seats.  Putting a seat in the shower.  Keep all follow-up visits as told by your doctor. This is important. Contact a doctor if:  Your personality changes.  You have trouble swallowing.  You  have double vision.  You are dizzy.  You have a fever. Get help right away if: These symptoms may be an emergency. Do not wait to see if the symptoms will go away. Get medical help right away. Call your local emergency services (911 in the [place] Do not drive yourself to the hospital.  You have sudden weakness or lose feeling (go numb), especially on one side of the body. This can affect your:  Face.  Arm.  Leg.  You have sudden trouble walking.  You have sudden trouble moving your arms or legs.  You have sudden confusion.  You have trouble talking.  You have trouble  understanding.  You have sudden trouble seeing in one or both eyes.  You lose your balance.  Your movements are not smooth.  You have a sudden, very bad headache with no known cause.  You have new chest pain.  Your heartbeat is unsteady.  You are partly or totally unaware of what is going on around you. This information is not intended to replace advice given to you by your health care provider. Make sure you discuss any questions you have with your health care provider. Document Released: 09/11/2008 Document Revised: 08/06/2016 Document Reviewed: 03/10/2014 Elsevier Interactive Patient Education  2017 [employer].

## 2017-03-12 ENCOUNTER — Ambulatory Visit: Payer: Medicare Other | Admitting: Family Medicine

## 2017-03-14 DIAGNOSIS — Z961 Presence of intraocular lens: Secondary | ICD-10-CM | POA: Diagnosis not present

## 2017-03-14 DIAGNOSIS — H02831 Dermatochalasis of right upper eyelid: Secondary | ICD-10-CM | POA: Diagnosis not present

## 2017-03-14 DIAGNOSIS — H04123 Dry eye syndrome of bilateral lacrimal glands: Secondary | ICD-10-CM | POA: Diagnosis not present

## 2017-03-14 DIAGNOSIS — H26492 Other secondary cataract, left eye: Secondary | ICD-10-CM | POA: Diagnosis not present

## 2017-03-14 DIAGNOSIS — H02834 Dermatochalasis of left upper eyelid: Secondary | ICD-10-CM | POA: Diagnosis not present

## 2017-04-05 ENCOUNTER — Encounter: Payer: Self-pay | Admitting: Pharmacist Clinician (PhC)/ Clinical Pharmacy Specialist

## 2017-04-05 DIAGNOSIS — I2510 Atherosclerotic heart disease of native coronary artery without angina pectoris: Secondary | ICD-10-CM | POA: Diagnosis not present

## 2017-04-05 DIAGNOSIS — I495 Sick sinus syndrome: Secondary | ICD-10-CM | POA: Diagnosis not present

## 2017-04-05 DIAGNOSIS — Z7901 Long term (current) use of anticoagulants: Secondary | ICD-10-CM | POA: Diagnosis not present

## 2017-04-05 DIAGNOSIS — Z951 Presence of aortocoronary bypass graft: Secondary | ICD-10-CM | POA: Diagnosis not present

## 2017-04-05 DIAGNOSIS — E785 Hyperlipidemia, unspecified: Secondary | ICD-10-CM | POA: Diagnosis not present

## 2017-04-05 DIAGNOSIS — Z8673 Personal history of transient ischemic attack (TIA), and cerebral infarction without residual deficits: Secondary | ICD-10-CM | POA: Diagnosis not present

## 2017-04-05 DIAGNOSIS — I482 Chronic atrial fibrillation: Secondary | ICD-10-CM | POA: Diagnosis not present

## 2017-04-05 DIAGNOSIS — I119 Hypertensive heart disease without heart failure: Secondary | ICD-10-CM | POA: Diagnosis not present

## 2017-04-12 ENCOUNTER — Ambulatory Visit (INDEPENDENT_AMBULATORY_CARE_PROVIDER_SITE_OTHER): Payer: Medicare Other | Admitting: Pharmacist Clinician (PhC)/ Clinical Pharmacy Specialist

## 2017-04-12 DIAGNOSIS — I4891 Unspecified atrial fibrillation: Secondary | ICD-10-CM | POA: Diagnosis not present

## 2017-04-12 LAB — COAGUCHEK XS/INR WAIVED
INR: 1.8 — ABNORMAL HIGH (ref 0.9–1.1)
Prothrombin Time: 21.1 s

## 2017-04-12 NOTE — Patient Instructions (Signed)
Anticoagulation Dose Instructions as of 04/12/2017      [name] Tue Wed Thu Fri Sat   New Dose 2.5 mg 5 mg 2.5 mg 5 mg 2.5 mg 5 mg 2.5 mg    Description   Continue taking warfarin the same way.  INR today is 1.8 (goal: 1.5-2.0)

## 2017-04-24 ENCOUNTER — Ambulatory Visit (INDEPENDENT_AMBULATORY_CARE_PROVIDER_SITE_OTHER): Payer: Medicare Other | Admitting: Neurology

## 2017-04-24 ENCOUNTER — Encounter: Payer: Self-pay | Admitting: Neurology

## 2017-04-24 VITALS — BP 117/75 | HR 71 | Ht 72.0 in | Wt 181.0 lb

## 2017-04-24 DIAGNOSIS — M5440 Lumbago with sciatica, unspecified side: Secondary | ICD-10-CM

## 2017-04-24 DIAGNOSIS — M5416 Radiculopathy, lumbar region: Secondary | ICD-10-CM

## 2017-04-24 DIAGNOSIS — G458 Other transient cerebral ischemic attacks and related syndromes: Secondary | ICD-10-CM

## 2017-04-24 DIAGNOSIS — R29898 Other symptoms and signs involving the musculoskeletal system: Secondary | ICD-10-CM

## 2017-04-24 DIAGNOSIS — G8929 Other chronic pain: Secondary | ICD-10-CM | POA: Diagnosis not present

## 2017-04-24 NOTE — Patient Instructions (Signed)
Remember to drink plenty of fluid, eat healthy meals and do not skip any meals. Try to eat protein with a every meal and eat a healthy snack such as fruit or nuts in between meals. Try to keep a regular sleep-wake schedule and try to exercise daily, particularly in the form of walking, 20-30 minutes a day, if you can.   As far as your medications are concerned, I would like to suggest: Will discuss Warfarin with Dr. Oran Rein  As far as diagnostic testing: MRI lumbar spine  I would like to see you back in 6 months, sooner if we need to. Please call us with any interim questions, concerns, problems, updates or refill requests. .   Our phone number is [phone]. We also have an after hours call service for urgent matters and there is a physician on-call for urgent questions. For any emergencies you know to call 911 or go to the nearest emergency room

## 2017-04-24 NOTE — Progress Notes (Signed)
[facility]    Provider:  Dr Dalton Vaughan Referring Provider: Chipper Herb, MD Primary Care Physician:  Dalton Herb, MD  CC:  TIA  HPI:  Dalton Vaughan is a 83 y.o. male here as a referral from Dr. [name] for possible TIA. He has a past medical history of hypertension, heart disease, atrial fibrillation on chronic Coumadin, heart bypass surgery, lumbar surgery for herniated disc, hyperlipidemia, GERD, hypothyroidism, chronic neck and low back pain.He was originally evaluated here for bilateral lower extremity numbness diagnosed with idiopathic peripheral neuropathy. Today he is here for a new problem incident that he had that he thinks was a TIA. He walked down to get his paper in the mornng. Felt like his right shoe was filled with grease, it was hard to walk, the right hand didn't want to work like normal. No numbness or sensory changes, symptoms resolved in 30 minutes, no confusion or aphasia or dysarthria or dysphagia. He has been on Coumadin for several years. He wasn;t so weak but felt like he was slipping in grease and didin;t work correctly. He has a lot of back pain. He has paresthesias in his feet that are worsening. When he sits for too long he has radicular pain and weakness. No other focal neurologic deficits, associated symptoms, inciting events or modifiable factors.  Reviewed notes, labs and imaging from outside physicians, which showed:   Personally reviewed MRI of the brain images and agree with the following:  FINDINGS: Personally reviewed images of the brain and agree with the following: Brain: Negative for acute infarct. Scattered small white matter hyperintensities bilaterally consistent with microvascular ischemia. Chronic infarct in the thalamus bilaterally. Brainstem and cerebellum negative. Negative for hemorrhage or mass lesion.  Negative for hydrocephalus.  Cerebral volume normal for age.  Vascular: Normal arterial flow void.  Skull and upper  cervical spine: Negative  Sinuses/Orbits: Mild mucosal edema paranasal sinuses. Bilateral lens replacement.  Other: None  IMPRESSION: No acute intracranial abnormality. Moderate chronic microvascular ischemia.   Previous CC: Bilateral leg numbness  Interval history 02/2016: Here for numbness in his feet. follow up. HgbA1c 5.9. Labs normal including TSH, sed rate, b12, rpr, hep c, RF, heavy metals. B1, IFE/Spep, ANA and ANA comprehensive panel, lyme. He did not take the gabapentin I prescribed him and he started exercising and he feels better. He tries to change position more often and that helps. He has chronic low back pain that may be contributing. Reviewed all the labs above with patient. Discussed no risk factors for peripheral polyneuropathy except glucose intolerance. His restless leg symptoms have improved with exercise. He has a tread mill and a bicycle at [employer] and some weights. He feels better.  HPI 09/2015: Dalton Vaughan is a 83 y.o. male here as a referral from Dr. [name] for numbness in the legs. He has a past medical history of hypertension, heart disease, atrial fibrillation on chronic Coumadin, heart bypass surgery, lumbar surgery for herniated disc, hyperlipidemia, GERD, hypothyroidism, chronic neck and low back pain. He has had this numbness since for years. The numbness in the feet actually started even earlier than the 70s maybe in the 60s. Started out in the right leg below the knee medially. Then later on he started getting the numbness in his feet especially worse in the evening. Feet get cold and numb. The bottom of the feet are affected. He has burning on the bottom of the feet. Feels like ice. Left is worse than the right. Worse at night  and can't get comfortable and feels like he has to move his legs. He has pain in the left hip and groin area with soreness to touch. He has pain in the neck for several months. His feet bother him most at night. He takes remeron at night  and that seems to exacerbate the symptoms. Keeps him awake most of the night. If he can go to sleep he sleeps well. Better during the day when moving around. Symptoms are getting worse. When he was younger he never drank alcohol excessively, he occasionally drinks a glass of wine now, not too often. He was in the [employer] and he has been to lots of places, been exposed to [employer]. He feels like he drags his feet, when he is walking he is not as steady. Balance is affected. He has pain in his hips and has diffculty sometimes. No recent falls. No FHx of neuromuscular disorders or neuropathy. Feels his neuropathy may be related to Agent Orange exposure.   Reviewed notes, labs and imaging from outside physicians, which showed: Patient has chronic left-sided pelvic pain with no significant resolution. He has been seen by GI specialist. He had a CT scan of the pelvis which did not show any etiology. He has completed physical therapy. He is on chronic Coumadin use. He has neck pain, mostly likely degenerative disc disease. No radicular symptoms noted. Primary care was concerned with the symptoms patient was having in his feet and lower limbs, concern whether this could be a peripheral neuropathic process and referred him to neurology.   MRI pelvis 05/2015: IMPRESSION: No evidence of rectal/pelvic floor prolapse.  No evidence of perirectal fluid collection/abscess.  Sigmoid diverticulosis.  Low T2 signal in the left posterior apex of the peripheral gland of the prostate, nonspecific, possibly reflecting prostatitis although prostate cancer is not excluded. Correlate with PSA.  MRi of the brain (personally reviewed images and agree with the following):  1. No acute intracranial abnormality. 2. Chronic small vessel ischemic disease including involvement of the thalami.  BMP 02/2015 unremarkable TSH 02/2015 wnl LFTs 05/2015 nml Review of Systems: Patient complains of symptoms per HPI  as well as the following symptoms: no CP, no SOB. Pertinent negatives per HPI. All others negative.   Social History   Social History  . Marital status: Married    Spouse name: Dalton Vaughan   . Number of children: 1  . Years of education: 49   Occupational History  . Retired    Social History Main Topics  . Smoking status: Former Smoker    Types: Cigarettes    Start date: 12/17/1948    Quit date: 12/17/1966  . Smokeless tobacco: Never Used  . Alcohol use 0.6 oz/week    1 Standard drinks or equivalent per week     Comment: Occassionally  . Drug use: No  . Sexual activity: No   Other Topics Concern  . Not on file   Social History Narrative   Married.  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 Cancer Brother        prostate  . Asthma Sister   . Heart disease Paternal Aunt   . Heart disease Paternal Uncle   . Hypertension Maternal Grandmother   . CVA Maternal Grandmother   . COPD Sister   . CVA  Paternal Grandmother   . Colon cancer Neg Hx   . Colon polyps Neg Hx   . Kidney disease Neg Hx   . Esophageal cancer Neg Hx   . Gallbladder disease Neg Hx   . Diabetes Neg Hx     Past Medical History:  Diagnosis Date  . Anal fissure   . Atrial fibrillation ([place])   . BPH (benign prostatic hypertrophy)   . CAD (coronary artery disease)    Dr. Wynonia Lawman  . Cataract   . Depression 2004  . Diverticulosis   . GERD (gastroesophageal reflux disease)   . History of TIAs   . Hyperlipidemia   . Hypertension   . Hypertensive heart disease without CHF   . Hypothyroidism   . Internal hemorrhoids   . Lumbar disc disease   . Oral bleeding 08/20/2012   Following molar tooth extraction July, 2013  . Ruptured disk 1995  . Shingles   . Thyroid disease   . Vitamin D deficiency   . Vitreous detachment     Past Surgical History:  Procedure Laterality Date  .  bilateral cataract surgery  6/07  . CORONARY ARTERY BYPASS GRAFT  12/99    Dr. Servando Snare   . cysloscopy  8/08  . EYE SURGERY    . HERNIA REPAIR  1997   right  . herninated disc  1995  . PROSTATE BIOPSY  8/08  . TONSILLECTOMY AND ADENOIDECTOMY      Current Outpatient Prescriptions  Medication Sig Dispense Refill  . amLODipine (NORVASC) 5 MG tablet TAKE ONE-HALF (1/2) TABLET TWICE A DAY 90 tablet 1  . Calcium Carb-Cholecalciferol (CALCIUM + D3) 600-200 MG-UNIT TABS Take 0.25 tablets by mouth daily.    . Cholecalciferol (VITAMIN D-3) 5000 UNITS TABS Take 1 tablet by mouth daily.     Marland Kitchen doxazosin (CARDURA) 8 MG tablet TAKE 1 TABLET AT BEDTIME 90 tablet 1  . levothyroxine (SYNTHROID, LEVOTHROID) 50 MCG tablet TAKE 1 TABLET DAILY 90 tablet 1  . mirtazapine (REMERON) 15 MG tablet TAKE 1 TABLET DAILY AT BEDTIME AS DIRECTED 90 tablet 0  . Multiple Vitamin (MULTIVITAMIN) tablet Take 1 tablet by mouth daily.      . ramipril (ALTACE) 10 MG capsule Take 1 capsule (10 mg total) by mouth daily. 90 capsule 3  . rosuvastatin (CRESTOR) 5 MG tablet TAKE 1 TABLET DAILY AS DIRECTED 90 tablet 0  . triamterene-hydrochlorothiazide (MAXZIDE-25) 37.5-25 MG tablet Take 1 tablet by mouth daily. 90 tablet 3  . vitamin C (ASCORBIC ACID) 500 MG tablet Take 500 mg by mouth daily. Take 1/4 tablet by mouth daily.    Marland Kitchen warfarin (COUMADIN) 5 MG tablet TAKE AS DIRECTED 90 tablet 0   No current facility-administered medications for this visit.     Allergies as of 04/24/2017 - Review Complete 04/24/2017  Allergen Reaction Noted  . Paxil [paroxetine hcl] Palpitations 04/22/2013  . Eliquis [apixaban] Other (See Comments) 04/22/2013  . Penicillins  07/05/2012  . Pravachol [pravastatin sodium]  08/26/2012  . Zetia [ezetimibe]  08/26/2012  . Vytorin [ezetimibe-simvastatin] Other (See Comments) 04/22/2013    Vitals: BP 117/75   Pulse 71   Ht 6' (1.829 m)   Wt 181 lb (82.1 kg)   BMI 24.55 kg/m  Last Weight:  Wt  Readings from Last 1 Encounters:  04/24/17 181 lb (82.1 kg)   Last Height:   Ht Readings from Last 1 Encounters:  04/24/17 6' (1.829 m)    Physical exam: Exam: Gen: NAD, conversant  Eyes: Conjunctivae clear without exudates or hemorrhage  Neuro: Detailed Neurologic Exam  Speech:    Speech is normal; fluent and spontaneous with normal comprehension.  Cognition:    The patient is oriented to person, place, and time;    Cranial Nerves:    The pupils are equal, round, and reactive to light. Visual fields are full to finger confrontation. Extraocular movements are intact. Trigeminal sensation is intact and the muscles of mastication are normal. The face is symmetric. The palate elevates in the midline. Hearing intact. Voice is normal. Shoulder shrug is normal. The tongue has normal motion without fasciculations.    Motor Observation:    No asymmetry, no atrophy, and no involuntary movements noted. Tone:    Normal muscle tone.    Posture:    Posture is normal. normal erect    Strength: Mild left hip flexion weakness Decreased sensation distally in the LE        Assessment/Plan:  This is an 83 year old patient who was initially seen in 2016 for idiopathic peripheral neuropathy who presents today for a new problem, TIA. He has a past medical history of hypertension, heart disease, atrial fibrillation on chronic Coumadin, heart bypass surgery, lumbar surgery for herniated disc, hyperlipidemia, GERD, hypothyroidism, chronic neck and low back pain.  Patient has chronic afib and cannot take full dose Warfarin due to bleeding issues. His current goal is 1.5-2(last INR 1.6) but usual therapeutic dose is 2-2.5. He had an episode of right sided weakness that may be a TIA. I do recommend given this possible TIA, not being able to be on full dose warfarin due to previous bleeding issues, that the patient is changed to Eliquis or other novel oral anticoagulant. Patient is willing  to try Eliquis at this time again. I will contact his cardiologist and suggest this, he is seen in the anticoagulation clinic and I suggest follow up with them for initiation of Eliquis.  Chronic low back pain, leg weakness, difficulty with ambulation. It MRI of the lumbar spine to evaluate for radiculopathy and spinal stenosis.  Patient is a fall risk discussed fall precautions.  Orders Placed This Encounter  Procedures  . MR LUMBAR SPINE WO CONTRAST    Cc: Drs Dalton Vaughan and Lyndle Herrlich, MD  [facility] [address] [place], [place] [address]  Phone [phone] Fax [phone]  A total of 25 minutes was spent face-to-face with this patient. Over half this time was spent on counseling patient on the TIA, lumbar radiculopathy and stenosis diagnosis and different diagnostic and therapeutic options available.

## 2017-05-01 ENCOUNTER — Encounter: Payer: Self-pay | Admitting: Family Medicine

## 2017-05-01 ENCOUNTER — Ambulatory Visit (INDEPENDENT_AMBULATORY_CARE_PROVIDER_SITE_OTHER): Payer: Medicare Other

## 2017-05-01 ENCOUNTER — Ambulatory Visit (INDEPENDENT_AMBULATORY_CARE_PROVIDER_SITE_OTHER): Payer: Medicare Other | Admitting: Family Medicine

## 2017-05-01 VITALS — BP 124/73 | HR 65 | Temp 97.3°F | Ht 72.0 in | Wt 184.0 lb

## 2017-05-01 DIAGNOSIS — E7800 Pure hypercholesterolemia, unspecified: Secondary | ICD-10-CM

## 2017-05-01 DIAGNOSIS — I119 Hypertensive heart disease without heart failure: Secondary | ICD-10-CM | POA: Diagnosis not present

## 2017-05-01 DIAGNOSIS — E559 Vitamin D deficiency, unspecified: Secondary | ICD-10-CM

## 2017-05-01 DIAGNOSIS — R5383 Other fatigue: Secondary | ICD-10-CM | POA: Diagnosis not present

## 2017-05-01 DIAGNOSIS — D696 Thrombocytopenia, unspecified: Secondary | ICD-10-CM | POA: Diagnosis not present

## 2017-05-01 DIAGNOSIS — I482 Chronic atrial fibrillation, unspecified: Secondary | ICD-10-CM

## 2017-05-01 LAB — COAGUCHEK XS/INR WAIVED
INR: 1.8 — ABNORMAL HIGH (ref 0.9–1.1)
Prothrombin Time: 22.2 s

## 2017-05-01 NOTE — Patient Instructions (Addendum)
Medicare Annual Wellness Visit  [facility] and the medical providers at [facility] strive to bring you the best medical care.  In doing so we not only want to address your current medical conditions and concerns but also to detect new conditions early and prevent illness, disease and health-related problems.    Medicare offers a yearly Wellness Visit which allows our clinical staff to assess your need for preventative services including immunizations, lifestyle education, counseling to decrease risk of preventable diseases and screening for fall risk and other medical concerns.    This visit is provided free of charge (no copay) for all Medicare recipients. The clinical pharmacists at [facility] have begun to conduct these Wellness Visits which will also include a thorough review of all your medications.    As you primary medical provider recommend that you make an appointment for your Annual Wellness Visit if you have not done so already this year.  You may set up this appointment before you leave today or you may call back ([phone]) and schedule an appointment.  Please make sure when you call that you mention that you are scheduling your Annual Wellness Visit with the clinical pharmacist so that the appointment may be made for the proper length of time.     Continue current medications. Continue good therapeutic lifestyle changes which include good diet and exercise. Fall precautions discussed with patient. If an FOBT was given today- please return it to our front desk. If you are over 60 years old - you may need Prevnar 53 or the adult Pneumonia vaccine.  **Flu shots are available--- please call and schedule a FLU-CLINIC appointment**  After your visit with [place] today you will receive a survey in the mail or online from [employer] regarding your care with [place]. Please take a moment to fill this out. Your feedback is very  important to [place] as you can help [place] better understand your patient needs as well as improve your experience and satisfaction. WE CARE ABOUT YOU!!!   Follow-up with neurologist as planned Follow-up with MRI of back as planned by the neurologist After speaking with the cardiologist today he recommended that we push up the Coumadin to the 2-2.5 range We would ask that you call us at the first site of any rectal bleeding.

## 2017-05-01 NOTE — Progress Notes (Signed)
Subjective:    Patient ID: Dalton Vaughan, male    DOB: [birth year], 83 y.o.   MRN: [id number]  HPI Pt here for follow up and management of chronic medical problems which includes hyperlipidemia and a fib. He is taking medication regularly.The patient today complains of fatigue and joint pain. He is due to get a chest x-Deno today and will return to the office fasting for his lab work. He is followed regularly by the urologist Dr. Alinda Money for his prostate and rectal exam and that will be next month that he sees him. The patient also sees Dr. Tollie Eth for his cardiac follow-up and Dr. Kathe Becton for any gastroenterology issues. He does have chronic atrial fibrillation and we keep his pro times in the slightly thicker range because of GI bleeding issues in the past. The patient has some type of event recently and saw Dr. Wendi Snipes here he was subsequently referred to the hospital for an MRI of his head which was negative. He also saw the neurologist and she is doing a further workup of getting an MRI of his low back. There is a discussion as to whether he needs to be on one of the newer novel agents for his heart rhythm which would include Xarelto or eliquis. He is supposed to hear from the neurologist after consulting with her cardiologist soon. The patient denies any chest pain shortness of breath or trouble with his stomach including nausea vomiting diarrhea or blood in the stool. He is followed regularly by the urologist every year because of a prostate nodule which so far has not been diagnosed as cancer. He does complain of multiple joint pains which seem to be aggravated by activity. The neurologist is also scheduled him for an MRI of his low back.    Patient Active Problem List   Diagnosis Date Noted  . Thrombocytopenia ([place]) 07/10/2016  . Hereditary and idiopathic peripheral neuropathy 10/16/2015  . Mononeuropathy 10/04/2015  . Allergic rhinitis 05/06/2014  . Depression 08/03/2013  . Sick  sinus syndrome ([place]) 07/31/2012  . Hypertensive heart disease without CHF   . Chronic atrial fibrillation ([place])   . CAD (coronary artery disease)   . History of TIAs   . Lumbar disc disease   . GERD (gastroesophageal reflux disease)   . Long-term (current) use of anticoagulants   . BPH (benign prostatic hypertrophy)   . Hyperlipidemia    Outpatient Encounter Prescriptions as of 05/01/2017  Medication Sig  . amLODipine (NORVASC) 5 MG tablet TAKE ONE-HALF (1/2) TABLET TWICE A DAY  . Calcium Carb-Cholecalciferol (CALCIUM + D3) 600-200 MG-UNIT TABS Take 0.25 tablets by mouth daily.  . Cholecalciferol (VITAMIN D-3) 5000 UNITS TABS Take 1 tablet by mouth daily.   Marland Kitchen doxazosin (CARDURA) 8 MG tablet TAKE 1 TABLET AT BEDTIME  . levothyroxine (SYNTHROID, LEVOTHROID) 50 MCG tablet TAKE 1 TABLET DAILY  . mirtazapine (REMERON) 15 MG tablet TAKE 1 TABLET DAILY AT BEDTIME AS DIRECTED  . Multiple Vitamin (MULTIVITAMIN) tablet Take 1 tablet by mouth daily.    . ramipril (ALTACE) 10 MG capsule Take 1 capsule (10 mg total) by mouth daily.  . rosuvastatin (CRESTOR) 5 MG tablet TAKE 1 TABLET DAILY AS DIRECTED  . triamterene-hydrochlorothiazide (MAXZIDE-25) 37.5-25 MG tablet Take 1 tablet by mouth daily.  . vitamin C (ASCORBIC ACID) 500 MG tablet Take 500 mg by mouth daily. Take 1/4 tablet by mouth daily.  Marland Kitchen warfarin (COUMADIN) 5 MG tablet TAKE AS DIRECTED   No facility-administered encounter medications  on file as of 05/01/2017.       Review of Systems  Constitutional: Positive for fatigue.  HENT: Negative.   Eyes: Negative.   Respiratory: Negative.   Cardiovascular: Negative.   Gastrointestinal: Negative.   Endocrine: Negative.   Genitourinary: Negative.   Musculoskeletal: Positive for myalgias.  Skin: Negative.   Allergic/Immunologic: Negative.   Neurological: Negative.   Hematological: Negative.   Psychiatric/Behavioral: Negative.        Objective:   Physical Exam  Constitutional: He is  oriented to person, place, and time. He appears well-developed and well-nourished. No distress.  Quiet but alert.  HENT:  Head: Normocephalic and atraumatic.  Right Ear: External ear normal.  Left Ear: External ear normal.  Nose: Nose normal.  Mouth/Throat: Oropharynx is clear and moist. No oropharyngeal exudate.  Eyes: Conjunctivae and EOM are normal. Pupils are equal, round, and reactive to light. Right eye exhibits no discharge. Left eye exhibits no discharge. No scleral icterus.  Neck: Normal range of motion. Neck supple. No thyromegaly present.  No bruits thyromegaly or anterior cervical adenopathy  Cardiovascular: Normal rate, normal heart sounds and intact distal pulses.   No murmur heard. The heart is irregular irregular at 72/m  Pulmonary/Chest: Effort normal and breath sounds normal. No respiratory distress. He has no wheezes. He has no rales. He exhibits no tenderness.  Clear anteriorly and posteriorly with no axillary adenopathy  Abdominal: Soft. Bowel sounds are normal. He exhibits no mass. There is no tenderness. There is no rebound and no guarding.  No abdominal tenderness masses or bruits  Genitourinary:  Genitourinary Comments: The patient follows up regularly with the urologist on a yearly basis for what the urologist thinks just a prostate nodule.  Musculoskeletal: Normal range of motion. He exhibits no edema.  No swollen joints were noted and patient had good movement that was reviewed.  Lymphadenopathy:    He has no cervical adenopathy.  Neurological: He is alert and oriented to person, place, and time. He has normal reflexes. No cranial nerve deficit.  Skin: Skin is warm and dry. No rash noted.  Psychiatric: He has a normal mood and affect. His behavior is normal. Judgment and thought content normal.  Nursing note and vitals reviewed.   BP 124/73 (BP Location: Left Arm)   Pulse 65   Temp 97.3 F (36.3 C) (Oral)   Ht 6' (1.829 m)   Wt 184 lb (83.5 kg)   BMI  24.95 kg/m        Assessment & Plan:  1. Chronic atrial fibrillation (HCC) -The patient remains in atrial fibrillation and follows up with the cardiologist about every 6 months. -We will change his Coumadin to one half on Monday Wednesday and Friday and one whole pill of 5 mg the other days of the week. He will come back in a couple weeks for a ProTime rechecked with the goal to keep her pro time between 2 and 2.5. This was in agreement with his cardiologist. - CBC with Differential/Platelet; Future  2. Pure hypercholesterolemia -He will continue with aggressive therapeutic lifestyle changes and his current treatment pending results of lab work - CBC with Differential/Platelet; Future - Lipid panel; Future - DG Chest 2 View; Future  3. Vitamin D deficiency -Continue with vitamin D replacement pending results of lab work - CBC with Differential/Platelet; Future - VITAMIN D 25 Hydroxy (Vit-D Deficiency, Fractures); Future  4. hypertension -The blood pressure is good today he will continue with current treatment - BMP8+EGFR; Future -  CBC with Differential/Platelet; Future - Hepatic function panel; Future - DG Chest 2 View; Future  5. Thrombocytopenia ([place]) -The patient currently describes no bleeding issues with a history of thrombocytopenia. - CBC with Differential/Platelet; Future  6. Fatigue, unspecified type -He does complain of fatigue and general and we will do additional tests for this - Sedimentation rate; Future - Thyroid Panel With TSH; Future  Patient Instructions                       Medicare Annual Wellness Visit  [facility] and the medical providers at [facility] strive to bring you the best medical care.  In doing so we not only want to address your current medical conditions and concerns but also to detect new conditions early and prevent illness, disease and health-related problems.    Medicare offers a yearly Wellness Visit which  allows our clinical staff to assess your need for preventative services including immunizations, lifestyle education, counseling to decrease risk of preventable diseases and screening for fall risk and other medical concerns.    This visit is provided free of charge (no copay) for all Medicare recipients. The clinical pharmacists at [facility] have begun to conduct these Wellness Visits which will also include a thorough review of all your medications.    As you primary medical provider recommend that you make an appointment for your Annual Wellness Visit if you have not done so already this year.  You may set up this appointment before you leave today or you may call back [phone]) and schedule an appointment.  Please make sure when you call that you mention that you are scheduling your Annual Wellness Visit with the clinical pharmacist so that the appointment may be made for the proper length of time.     Continue current medications. Continue good therapeutic lifestyle changes which include good diet and exercise. Fall precautions discussed with patient. If an FOBT was given today- please return it to our front desk. If you are over 85 years old - you may need Prevnar 74 or the adult Pneumonia vaccine.  **Flu shots are available--- please call and schedule a FLU-CLINIC appointment**  After your visit with [place] today you will receive a survey in the mail or online from [employer] regarding your care with [place]. Please take a moment to fill this out. Your feedback is very important to [place] as you can help [place] better understand your patient needs as well as improve your experience and satisfaction. WE CARE ABOUT YOU!!!   Follow-up with neurologist as planned Follow-up with MRI of back as planned by the neurologist After speaking with the cardiologist today he recommended that we push up the Coumadin to the 2-2.5 range We would ask that you call us at the first site of any rectal  bleeding.  Arrie Senate MD

## 2017-05-02 ENCOUNTER — Encounter: Payer: Self-pay | Admitting: Family Medicine

## 2017-05-02 DIAGNOSIS — I700 Atherosclerosis of aorta: Secondary | ICD-10-CM | POA: Insufficient documentation

## 2017-05-06 ENCOUNTER — Other Ambulatory Visit: Payer: Medicare Other

## 2017-05-06 DIAGNOSIS — E559 Vitamin D deficiency, unspecified: Secondary | ICD-10-CM | POA: Diagnosis not present

## 2017-05-06 DIAGNOSIS — R5383 Other fatigue: Secondary | ICD-10-CM | POA: Diagnosis not present

## 2017-05-06 DIAGNOSIS — I482 Chronic atrial fibrillation, unspecified: Secondary | ICD-10-CM

## 2017-05-06 DIAGNOSIS — I4891 Unspecified atrial fibrillation: Secondary | ICD-10-CM

## 2017-05-06 DIAGNOSIS — E7800 Pure hypercholesterolemia, unspecified: Secondary | ICD-10-CM

## 2017-05-06 DIAGNOSIS — I119 Hypertensive heart disease without heart failure: Secondary | ICD-10-CM | POA: Diagnosis not present

## 2017-05-06 DIAGNOSIS — D696 Thrombocytopenia, unspecified: Secondary | ICD-10-CM | POA: Diagnosis not present

## 2017-05-07 LAB — CBC WITH DIFFERENTIAL/PLATELET
Basophils Absolute: 0 10*3/uL (ref 0.0–0.2)
Basos: 0 %
EOS (ABSOLUTE): 0.2 10*3/uL (ref 0.0–0.4)
Eos: 2 %
Hematocrit: 39.8 % (ref 37.5–51.0)
Hemoglobin: 13 g/dL (ref 13.0–17.7)
Immature Grans (Abs): 0 10*3/uL (ref 0.0–0.1)
Immature Granulocytes: 0 %
Lymphocytes Absolute: 3.1 10*3/uL (ref 0.7–3.1)
Lymphs: 40 %
MCH: 29.5 pg (ref 26.6–33.0)
MCHC: 32.7 g/dL (ref 31.5–35.7)
MCV: 91 fL (ref 79–97)
Monocytes Absolute: 0.7 10*3/uL (ref 0.1–0.9)
Monocytes: 9 %
Neutrophils Absolute: 3.7 10*3/uL (ref 1.4–7.0)
Neutrophils: 49 %
Platelets: 148 10*3/uL — ABNORMAL LOW (ref 150–379)
RBC: 4.4 x10E6/uL (ref 4.14–5.80)
RDW: 14.8 % (ref 12.3–15.4)
WBC: 7.8 10*3/uL (ref 3.4–10.8)

## 2017-05-07 LAB — LIPID PANEL
Chol/HDL Ratio: 3.3 ratio (ref 0.0–5.0)
Cholesterol, Total: 146 mg/dL (ref 100–199)
HDL: 44 mg/dL (ref >39)
LDL Calculated: 86 mg/dL (ref 0–99)
Triglycerides: 80 mg/dL (ref 0–149)
VLDL Cholesterol Cal: 16 mg/dL (ref 5–40)

## 2017-05-07 LAB — HEPATIC FUNCTION PANEL
ALT: 11 IU/L (ref 0–44)
AST: 17 IU/L (ref 0–40)
Albumin: 4 g/dL (ref 3.5–4.7)
Alkaline Phosphatase: 89 IU/L (ref 39–117)
Bilirubin Total: 0.4 mg/dL (ref 0.0–1.2)
Bilirubin, Direct: 0.15 mg/dL (ref 0.00–0.40)
Total Protein: 6.1 g/dL (ref 6.0–8.5)

## 2017-05-07 LAB — VITAMIN D 25 HYDROXY (VIT D DEFICIENCY, FRACTURES): Vit D, 25-Hydroxy: 81.5 ng/mL (ref 30.0–100.0)

## 2017-05-07 LAB — BMP8+EGFR
BUN/Creatinine Ratio: 16 (ref 10–24)
BUN: 15 mg/dL (ref 8–27)
CO2: 28 mmol/L (ref 18–29)
Calcium: 9.3 mg/dL (ref 8.6–10.2)
Chloride: 103 mmol/L (ref 96–106)
Creatinine, Ser: 0.94 mg/dL (ref 0.76–1.27)
GFR calc Af Amer: 85 mL/min/{1.73_m2} (ref >59)
GFR calc non Af Amer: 74 mL/min/{1.73_m2} (ref >59)
Glucose: 83 mg/dL (ref 65–99)
Potassium: 4.2 mmol/L (ref 3.5–5.2)
Sodium: 144 mmol/L (ref 134–144)

## 2017-05-07 LAB — THYROID PANEL WITH TSH
Free Thyroxine Index: 1.7 (ref 1.2–4.9)
T3 Uptake Ratio: 27 % (ref 24–39)
T4, Total: 6.2 ug/dL (ref 4.5–12.0)
TSH: 4.89 u[IU]/mL — ABNORMAL HIGH (ref 0.450–4.500)

## 2017-05-07 LAB — SEDIMENTATION RATE: Sed Rate: 20 mm/hr (ref 0–30)

## 2017-05-08 ENCOUNTER — Ambulatory Visit
Admission: RE | Admit: 2017-05-08 | Discharge: 2017-05-08 | Disposition: A | Discharge status: Home / Self Care | Payer: Medicare Other | Source: Ambulatory Visit | Attending: Neurology | Admitting: Neurology

## 2017-05-08 DIAGNOSIS — M48061 Spinal stenosis, lumbar region without neurogenic claudication: Secondary | ICD-10-CM | POA: Diagnosis not present

## 2017-05-08 DIAGNOSIS — M5440 Lumbago with sciatica, unspecified side: Secondary | ICD-10-CM

## 2017-05-08 DIAGNOSIS — M5416 Radiculopathy, lumbar region: Secondary | ICD-10-CM

## 2017-05-08 DIAGNOSIS — G8929 Other chronic pain: Secondary | ICD-10-CM

## 2017-05-08 DIAGNOSIS — R29898 Other symptoms and signs involving the musculoskeletal system: Secondary | ICD-10-CM

## 2017-05-10 ENCOUNTER — Ambulatory Visit (INDEPENDENT_AMBULATORY_CARE_PROVIDER_SITE_OTHER): Payer: Medicare Other | Admitting: Pharmacist

## 2017-05-10 DIAGNOSIS — I482 Chronic atrial fibrillation, unspecified: Secondary | ICD-10-CM

## 2017-05-10 DIAGNOSIS — Z7901 Long term (current) use of anticoagulants: Secondary | ICD-10-CM

## 2017-05-10 LAB — COAGUCHEK XS/INR WAIVED
INR: 1.5 — ABNORMAL HIGH (ref 0.9–1.1)
Prothrombin Time: 18.3 s

## 2017-05-16 ENCOUNTER — Other Ambulatory Visit: Payer: Self-pay | Admitting: Neurology

## 2017-05-16 ENCOUNTER — Telehealth: Payer: Self-pay | Admitting: *Deleted

## 2017-05-16 DIAGNOSIS — M5410 Radiculopathy, site unspecified: Secondary | ICD-10-CM

## 2017-05-16 NOTE — Telephone Encounter (Signed)
Patient called office and would like to cancel the referral [facility] and would like to see Dr. Vertell Limber.

## 2017-05-16 NOTE — Telephone Encounter (Signed)
I have placed referral thanks

## 2017-05-16 NOTE — Telephone Encounter (Signed)
Spoke with patient and informed him that he has some  degenerative/arthritic changes in the low back with possible  nerve pinching.  Advised him this may explain his worse symptoms and weakness in his left leg. Per Dr Jaynee Eagles , questioned if he would like a referral to orthopaedics for evaluation of shots in the low back or surgery. Patient asked which orthopaedic dr. This RN stated it would most like;ly be to [facility]. The patient stated he has been seen there before.  He agreed to a referral back to [facility]. This RN advised will let Dr Jaynee Eagles know. Advised if he has not heard back by the end of next week, he can call to check on referral. Patient verbalized understanding, had no other questions.

## 2017-05-22 ENCOUNTER — Ambulatory Visit (INDEPENDENT_AMBULATORY_CARE_PROVIDER_SITE_OTHER): Payer: Medicare Other | Admitting: Family Medicine

## 2017-05-22 ENCOUNTER — Encounter: Payer: Self-pay | Admitting: Family Medicine

## 2017-05-22 VITALS — BP 149/94 | HR 60 | Temp 97.7°F | Ht 72.0 in | Wt 185.0 lb

## 2017-05-22 DIAGNOSIS — S80861A Insect bite (nonvenomous), right lower leg, initial encounter: Secondary | ICD-10-CM

## 2017-05-22 DIAGNOSIS — I495 Sick sinus syndrome: Secondary | ICD-10-CM | POA: Diagnosis not present

## 2017-05-22 DIAGNOSIS — I482 Chronic atrial fibrillation, unspecified: Secondary | ICD-10-CM

## 2017-05-22 DIAGNOSIS — I119 Hypertensive heart disease without heart failure: Secondary | ICD-10-CM | POA: Diagnosis not present

## 2017-05-22 DIAGNOSIS — W57XXXA Bitten or stung by nonvenomous insect and other nonvenomous arthropods, initial encounter: Secondary | ICD-10-CM | POA: Diagnosis not present

## 2017-05-22 MED ORDER — DOXYCYCLINE HYCLATE 100 MG PO CAPS: ORAL_CAPSULE | ORAL | 0 refills | Status: DC

## 2017-05-22 NOTE — Progress Notes (Deleted)
Chief Complaint  Patient presents with  . Tick Removal    pt here today c/o having removed mulitple ticks this year already but has one spot on his right calf     HPI  Patient presents today for Frequent tick bites. He checks himself every night and if he has a tick bite he gives himself to doxycycline tablets. He's had half a dozen or so recently. There is one on the right calf he is concerned about having a round bull's-eye rash. He's been doing some reading since an experience with [facility] spotted fever a few years ago. He's heard about the potential for Lyme's disease with the bull's-eye rash. He is in atrial fibrillation patient taking Coumadin. Blood pressure noted elevated today. He is taking medication for that regularly. He is under care of Dr. Laurance Flatten for those conditions.  PMH: Smoking status noted ROS: Review of Systems  Constitutional: Negative for chills, diaphoresis and fever.  HENT: Negative.   Respiratory: Negative for shortness of breath and wheezing.   Cardiovascular: Negative for chest pain and leg swelling.  Skin: Positive for itching and rash.  due to multiple tick bites Neurological: Negative for headaches.     Objective: BP (!) 171/97   Pulse 60   Temp 97.7 F (36.5 C) (Oral)   Ht 6' (1.829 m)   Wt 185 lb (83.9 kg)   BMI 25.09 kg/m  Gen: NAD, alert, cooperative with exam HEENT: NCAT, EOMI, PERRL CV: Irregular irregular bradycardia Resp: CTABL, no wheezes, non-labored Abd: SNTND Ext: No edema, warm  Neuro: Alert and oriented, No gross deficits Skin: There is 1-1/2 cm hyperemia with a violaceous border. This is located at the proximal lateral aspect of the right calf. It is nontender. There is no fluctuance or induration. There is a 1 mm punctate central nidus. Assessment and plan:  1. hypertension   2. Sick sinus syndrome ([place])   3. Chronic atrial fibrillation ([place])   4. Tick bite, initial encounter     Meds ordered this encounter  Medications   . doxycycline (VIBRAMYCIN) 100 MG capsule    Sig: Take two within 72 hours of tick bite    Dispense:  20 capsule    Refill:  0  Reassured the patient that his rash did not resemble a erythema margin on them rash. It is most likely just some bruising related to his use of blood thinners.   Patient should follow up with Dr. Laurance Flatten regarding elevated blood pressure within the next couple of weeks. Recheck of blood pressure reveals systolic 195.  Claretta Fraise, MD

## 2017-05-22 NOTE — Progress Notes (Signed)
Chief Complaint  Patient presents with  . Tick Removal    pt here today c/o having removed mulitple ticks this year already but has one spot on his right calf     HPI  Patient presents today for Patient presents today for Frequent tick bites. He checks himself every night and if he has a tick bite he gives himself to doxycycline tablets. He's had half a dozen or so recently. There is one on the right calf he is concerned about having a round bull's-eye rash. He's been doing some reading since an experience with [facility] spotted fever a few years ago. He's heard about the potential for Lyme's disease with the bull's-eye rash. He is in atrial fibrillation patient taking Coumadin. Blood pressure noted elevated today. He is taking medication for that regularly. He is under care of Dr. Laurance Flatten for those conditions.  PMH: Smoking status noted ROS: Per HPI  Objective: BP (!) 171/97   Pulse 60   Temp 97.7 F (36.5 C) (Oral)   Ht 6' (1.829 m)   Wt 185 lb (83.9 kg)   BMI 25.09 kg/m  Gen: NAD, alert, cooperative with exam HEENT: NCAT, EOMI, PERRL CV: Irregular irregular bradycardia Resp: CTABL, no wheezes, non-labored Abd: SNTND, , no guarding or organomegaly Ext: No edema, warm Neuro: Alert and oriented, No gross deficits There is 1.5 cm hyperemic area with violaceous hematomas border located at the posterior lateral right calf. This is nontender. There is no induration and no fluctuance. Assessment and plan:  1. hypertension   2. Sick sinus syndrome ([place])   3. Chronic atrial fibrillation ([place])   4. Tick bite, initial encounter     Meds ordered this encounter  Medications  . doxycycline (VIBRAMYCIN) 100 MG capsule    Sig: Take two within 72 hours of tick bite    Dispense:  20 capsule    Refill:  0    The patient was reassured about this not being a rash of erythema margin on him. Encouraged to use doxycycline as above when necessary tick bite.  Follow up as needed.  Claretta Fraise, MD

## 2017-05-24 ENCOUNTER — Ambulatory Visit (INDEPENDENT_AMBULATORY_CARE_PROVIDER_SITE_OTHER): Payer: Medicare Other | Admitting: Pharmacist

## 2017-05-24 DIAGNOSIS — I482 Chronic atrial fibrillation, unspecified: Secondary | ICD-10-CM

## 2017-05-24 DIAGNOSIS — I4891 Unspecified atrial fibrillation: Secondary | ICD-10-CM

## 2017-05-24 DIAGNOSIS — Z7901 Long term (current) use of anticoagulants: Secondary | ICD-10-CM | POA: Diagnosis not present

## 2017-05-24 LAB — COAGUCHEK XS/INR WAIVED
INR: 1.9 — ABNORMAL HIGH (ref 0.9–1.1)
Prothrombin Time: 23 s

## 2017-05-24 NOTE — Patient Instructions (Signed)
Anticoagulation Warfarin Dose Instructions as of 05/24/2017      Dalton Vaughan Tue Wed Thu Fri Sat   New Dose 5 mg 2.5 mg 5 mg 5 mg 5 mg 2.5 mg 5 mg    Description   Take an extra 1/2 tablet today (6/8) - Then increase warfarin dose to 1/2 tablet on Mondays and 1 tablet all other days.  INR today is 1.9 (goal: 2.0-2.5)

## 2017-05-31 ENCOUNTER — Encounter: Payer: Self-pay | Admitting: Pharmacist Clinician (PhC)/ Clinical Pharmacy Specialist

## 2017-06-07 ENCOUNTER — Ambulatory Visit (INDEPENDENT_AMBULATORY_CARE_PROVIDER_SITE_OTHER): Payer: Medicare Other | Admitting: Pharmacist

## 2017-06-07 DIAGNOSIS — Z7901 Long term (current) use of anticoagulants: Secondary | ICD-10-CM

## 2017-06-07 DIAGNOSIS — I482 Chronic atrial fibrillation, unspecified: Secondary | ICD-10-CM

## 2017-06-07 DIAGNOSIS — I4891 Unspecified atrial fibrillation: Secondary | ICD-10-CM | POA: Diagnosis not present

## 2017-06-07 LAB — COAGUCHEK XS/INR WAIVED
INR: 2.9 — ABNORMAL HIGH (ref 0.9–1.1)
Prothrombin Time: 34.5 s

## 2017-06-07 NOTE — Patient Instructions (Signed)
Anticoagulation Warfarin Dose Instructions as of 06/07/2017      Dalton Vaughan Tue Wed Thu Fri Sat   New Dose 5 mg 2.5 mg 5 mg 5 mg 5 mg 2.5 mg 5 mg    Description   Take 1/2 tablet tomorrow (6/23). Then take warfarin as 1/2 tablet on Mondays and Fridays and 1 tablet all other days. If you stop taking CoQ 10 let us know because it can affect your INR.  INR today is 2.9 (goal: 2.0-2.5)

## 2017-06-12 DIAGNOSIS — R972 Elevated prostate specific antigen [PSA]: Secondary | ICD-10-CM | POA: Diagnosis not present

## 2017-06-12 DIAGNOSIS — R351 Nocturia: Secondary | ICD-10-CM | POA: Diagnosis not present

## 2017-06-12 DIAGNOSIS — N401 Enlarged prostate with lower urinary tract symptoms: Secondary | ICD-10-CM | POA: Diagnosis not present

## 2017-06-23 ENCOUNTER — Other Ambulatory Visit: Payer: Self-pay | Admitting: Family Medicine

## 2017-07-12 ENCOUNTER — Ambulatory Visit (INDEPENDENT_AMBULATORY_CARE_PROVIDER_SITE_OTHER): Payer: Medicare Other | Admitting: Pharmacist Clinician (PhC)/ Clinical Pharmacy Specialist

## 2017-07-12 DIAGNOSIS — I482 Chronic atrial fibrillation, unspecified: Secondary | ICD-10-CM

## 2017-07-12 DIAGNOSIS — I4891 Unspecified atrial fibrillation: Secondary | ICD-10-CM

## 2017-07-12 DIAGNOSIS — Z7901 Long term (current) use of anticoagulants: Secondary | ICD-10-CM | POA: Diagnosis not present

## 2017-07-12 LAB — COAGUCHEK XS/INR WAIVED
INR: 2.4 — ABNORMAL HIGH (ref 0.9–1.1)
Prothrombin Time: 28.4 s

## 2017-07-12 MED ORDER — AMLODIPINE BESYLATE 5 MG PO TABS: ORAL_TABLET | ORAL | 3 refills | Status: DC

## 2017-07-12 MED ORDER — LEVOTHYROXINE SODIUM 50 MCG PO TABS: 50.0000 ug | ORAL_TABLET | Freq: Every day | ORAL | 3 refills | Status: DC

## 2017-07-12 NOTE — Patient Instructions (Signed)
Anticoagulation Warfarin Dose Instructions as of 07/12/2017      Dalton Vaughan Tue Wed Thu Fri Sat   New Dose 5 mg 2.5 mg 5 mg 5 mg 5 mg 2.5 mg 5 mg    Description   Continue taking your warfarin the same way  INR today is  (goal: 2.0-2.5)

## 2017-07-22 DIAGNOSIS — M47812 Spondylosis without myelopathy or radiculopathy, cervical region: Secondary | ICD-10-CM | POA: Diagnosis not present

## 2017-07-22 DIAGNOSIS — M545 Low back pain: Secondary | ICD-10-CM | POA: Diagnosis not present

## 2017-07-22 DIAGNOSIS — M4126 Other idiopathic scoliosis, lumbar region: Secondary | ICD-10-CM | POA: Diagnosis not present

## 2017-07-22 DIAGNOSIS — M542 Cervicalgia: Secondary | ICD-10-CM | POA: Diagnosis not present

## 2017-07-22 DIAGNOSIS — G629 Polyneuropathy, unspecified: Secondary | ICD-10-CM | POA: Diagnosis not present

## 2017-07-22 DIAGNOSIS — M5416 Radiculopathy, lumbar region: Secondary | ICD-10-CM | POA: Diagnosis not present

## 2017-07-22 DIAGNOSIS — I10 Essential (primary) hypertension: Secondary | ICD-10-CM | POA: Diagnosis not present

## 2017-08-27 ENCOUNTER — Encounter: Payer: Self-pay | Admitting: Family Medicine

## 2017-08-27 ENCOUNTER — Ambulatory Visit (INDEPENDENT_AMBULATORY_CARE_PROVIDER_SITE_OTHER): Payer: Medicare Other | Admitting: Family Medicine

## 2017-08-27 VITALS — BP 151/72 | HR 68 | Temp 96.9°F | Ht 72.0 in | Wt 185.0 lb

## 2017-08-27 DIAGNOSIS — E039 Hypothyroidism, unspecified: Secondary | ICD-10-CM

## 2017-08-27 DIAGNOSIS — L989 Disorder of the skin and subcutaneous tissue, unspecified: Secondary | ICD-10-CM | POA: Diagnosis not present

## 2017-08-27 DIAGNOSIS — D696 Thrombocytopenia, unspecified: Secondary | ICD-10-CM

## 2017-08-27 DIAGNOSIS — I119 Hypertensive heart disease without heart failure: Secondary | ICD-10-CM | POA: Diagnosis not present

## 2017-08-27 DIAGNOSIS — E7800 Pure hypercholesterolemia, unspecified: Secondary | ICD-10-CM

## 2017-08-27 DIAGNOSIS — Z7901 Long term (current) use of anticoagulants: Secondary | ICD-10-CM

## 2017-08-27 DIAGNOSIS — I482 Chronic atrial fibrillation, unspecified: Secondary | ICD-10-CM

## 2017-08-27 DIAGNOSIS — E559 Vitamin D deficiency, unspecified: Secondary | ICD-10-CM

## 2017-08-27 DIAGNOSIS — B308 Other viral conjunctivitis: Secondary | ICD-10-CM

## 2017-08-27 DIAGNOSIS — G629 Polyneuropathy, unspecified: Secondary | ICD-10-CM | POA: Diagnosis not present

## 2017-08-27 LAB — COAGUCHEK XS/INR WAIVED
INR: 2.1 — ABNORMAL HIGH (ref 0.9–1.1)
Prothrombin Time: 25.5 s

## 2017-08-27 MED ORDER — POLYMYXIN B-TRIMETHOPRIM 10000-0.1 UNIT/ML-% OP SOLN: 1.0000 [drp] | Freq: Four times a day (QID) | OPHTHALMIC | 0 refills | Status: DC

## 2017-08-27 NOTE — Patient Instructions (Addendum)
Medicare Annual Wellness Visit  [facility] and the medical providers at [facility] strive to bring you the best medical care.  In doing so we not only want to address your current medical conditions and concerns but also to detect new conditions early and prevent illness, disease and health-related problems.    Medicare offers a yearly Wellness Visit which allows our clinical staff to assess your need for preventative services including immunizations, lifestyle education, counseling to decrease risk of preventable diseases and screening for fall risk and other medical concerns.    This visit is provided free of charge (no copay) for all Medicare recipients. The clinical pharmacists at [facility] have begun to conduct these Wellness Visits which will also include a thorough review of all your medications.    As you primary medical provider recommend that you make an appointment for your Annual Wellness Visit if you have not done so already this year.  You may set up this appointment before you leave today or you may call back ([phone]) and schedule an appointment.  Please make sure when you call that you mention that you are scheduling your Annual Wellness Visit with the clinical pharmacist so that the appointment may be made for the proper length of time.     Continue current medications. Continue good therapeutic lifestyle changes which include good diet and exercise. Fall precautions discussed with patient. If an FOBT was given today- please return it to our front desk. If you are over 43 years old - you may need Prevnar 37 or the adult Pneumonia vaccine.  **Flu shots are available--- please call and schedule a FLU-CLINIC appointment**  After your visit with [place] today you will receive a survey in the mail or online from [employer] regarding your care with [place]. Please take a moment to fill this out. Your feedback is very  important to [place] as you can help [place] better understand your patient needs as well as improve your experience and satisfaction. WE CARE ABOUT YOU!!!   Anticoagulation Warfarin Dose Instructions as of 08/27/2017      [name] Tue Wed Thu Fri Sat   New Dose 5 mg 2.5 mg 5 mg 5 mg 5 mg 2.5 mg 5 mg    Description   Continue taking your warfarin the same way  INR today is 2.1 (goal: 2.0-2.5)    We will discuss with Dr. Maryjean Ka to leave the Coumadin off for 3 days only and not 5 days. They should call you with a time to come in for the injection. Continue to follow-up with cardiology Limit the second toe to the great toe with the [place] as directed and do this screening for at least 2-3 more weeks and wear a hard sole shoe Use eyedrops as directed We will contact the neurosurgeon regarding the holding of Coumadin prior to your nerve block and either we or them will let you know exactly what to do as far as holding your Coumadin. We're going to recommend that you hold it for 3 days.

## 2017-08-27 NOTE — Addendum Note (Signed)
Addended by: Zannie Cove on: 08/27/2017 12:02 PM   Modules accepted: Orders

## 2017-08-27 NOTE — Progress Notes (Signed)
Subjective:    Patient ID: Dalton Vaughan, male    DOB: 1929-04-01, 82 y.o.   MRN: [id number]  HPI Pt here for follow up and management of chronic medical problems which includes a fib, hypothyroid and hyperlipidemia. He is taking medication regularly.The patient is doing well overall but does complain today of a worrisome lesion on the right side of his nose. He also complains of some toe pain in the second toe of the left foot that he hit on his bed. He will be given an FOBT to return and will get lab work today. His pro time today was 2.1 and he will continue with his current Coumadin treatment. He is followed regularly by the cardiologist. He is planning to have a nerve root block by Dr. Maryjean Ka. Before we give the okay for this we will speak with the neurologist that saw him in response to an apparent TIA several weeks ago. We'll make sure that she agrees with holding the Coumadin for 5 days as requested by the neurosurgeon. The patient indicates after seeing the neurologist that she ordered an MRI of his back and his brain and it was felt that the weakness he had in his leg was not from a TIA but from a neuropathy from his back and he is currently planning to have an injection for nerve block is as we give the okay to hold the Coumadin. He denies any chest pain or shortness of breath. He denies any trouble with nausea vomiting diarrhea blood in the stool or black tarry bowel movements. He is passing his water slowly but as usual and has not seen any blood in the urine. He is followed every 6 months by his cardiologist and plans to see him again sometime in October or November. The injection and is planned for Dr. Maryjean Ka will be sooner than that to help with his left leg. The patient is alert and a good historian.    Patient Active Problem List   Diagnosis Date Noted  . Aortic atherosclerosis ([place]) 05/02/2017  . Thrombocytopenia ([place]) 07/10/2016  . Hereditary and idiopathic peripheral neuropathy  10/16/2015  . Mononeuropathy 10/04/2015  . Allergic rhinitis 05/06/2014  . Depression 08/03/2013  . Sick sinus syndrome ([place]) 07/31/2012  . Hypertensive heart disease without CHF   . Chronic atrial fibrillation ([place])   . CAD (coronary artery disease)   . History of TIAs   . Lumbar disc disease   . GERD (gastroesophageal reflux disease)   . Chronic anticoagulation   . BPH (benign prostatic hypertrophy)   . Hyperlipidemia    Outpatient Encounter Prescriptions as of 08/27/2017  Medication Sig  . ALTACE 10 MG capsule TAKE 1 CAPSULE DAILY  . amLODipine (NORVASC) 5 MG tablet TAKE ONE-HALF (1/2) TABLET TWICE A DAY  . Calcium Carb-Cholecalciferol (CALCIUM + D3) 600-200 MG-UNIT TABS Take 0.25 tablets by mouth daily.  . Cholecalciferol (VITAMIN D-3) 5000 UNITS TABS Take 1 tablet by mouth daily.   Marland Kitchen doxazosin (CARDURA) 8 MG tablet TAKE 1 TABLET AT BEDTIME  . levothyroxine (SYNTHROID, LEVOTHROID) 50 MCG tablet Take 1 tablet (50 mcg total) by mouth daily.  . mirtazapine (REMERON) 15 MG tablet TAKE 1 TABLET DAILY AT BEDTIME AS DIRECTED  . Multiple Vitamin (MULTIVITAMIN) tablet Take 1 tablet by mouth daily.    . rosuvastatin (CRESTOR) 5 MG tablet TAKE 1 TABLET DAILY AS DIRECTED  . triamterene-hydrochlorothiazide (MAXZIDE-25) 37.5-25 MG tablet Take 1 tablet by mouth daily.  . vitamin C (ASCORBIC ACID) 500 MG  tablet Take 500 mg by mouth daily. Take 1/4 tablet by mouth daily.  Marland Kitchen warfarin (COUMADIN) 5 MG tablet TAKE AS DIRECTED  . doxycycline (VIBRAMYCIN) 100 MG capsule Take two within 72 hours of tick bite (Patient not taking: Reported on 08/27/2017)   No facility-administered encounter medications on file as of 08/27/2017.       Review of Systems  Constitutional: Negative.   HENT: Negative.   Eyes: Negative.   Respiratory: Negative.   Cardiovascular: Negative.   Gastrointestinal: Negative.   Endocrine: Negative.   Genitourinary: Negative.   Musculoskeletal: Positive for arthralgias (toe pain  - left 2nd ).  Skin: Negative.        Lesion on nose  Allergic/Immunologic: Negative.   Neurological: Negative.   Hematological: Negative.   Psychiatric/Behavioral: Negative.        Objective:   Physical Exam  Constitutional: He is oriented to person, place, and time. He appears well-developed and well-nourished. No distress.  The patient is alert and pleasant  HENT:  Head: Normocephalic and atraumatic.  Right Ear: External ear normal.  Left Ear: External ear normal.  Nose: Nose normal.  Mouth/Throat: Oropharynx is clear and moist. No oropharyngeal exudate.  The patient has an irritated skin lesion on his right nose where his eyeglasses fit. This is probably benign but he needs to have it excised by the dermatologist because of the irritation from the eyeglasses.  Eyes: Pupils are equal, round, and reactive to light. Conjunctivae and EOM are normal. Right eye exhibits no discharge. Left eye exhibits no discharge. No scleral icterus.  Neck: Normal range of motion. Neck supple. No thyromegaly present.  No bruits thyromegaly or anterior cervical adenopathy  Cardiovascular: Normal rate, normal heart sounds and intact distal pulses.   No murmur heard. The heart is irregular irregular at 72/m  Pulmonary/Chest: Effort normal and breath sounds normal. No respiratory distress. He has no wheezes. He has no rales. He exhibits no tenderness.  Clear anteriorly and posteriorly and no axillary adenopathy  Abdominal: Soft. Bowel sounds are normal. He exhibits no mass. There is no tenderness. There is no rebound and no guarding.  No abdominal tenderness masses or bruits or organ enlargement  Genitourinary:  Genitourinary Comments: He is followed regularly by the urologist  Musculoskeletal: Normal range of motion. He exhibits tenderness and deformity. He exhibits no edema.  The second toe of the left foot is swollen and tender to touch and movement. He most likely has a fracture. We used some Covan  to buddy tape this to his great toe and he is happy to continue with doing this over the next 3-4 weeks as long as he wears a hard sole shoe with this also. No x-rays will be obtained.  Lymphadenopathy:    He has no cervical adenopathy.  Neurological: He is alert and oriented to person, place, and time. He has normal reflexes. No cranial nerve deficit.  Skin: Skin is warm and dry. No rash noted.  There is a skin lesion under the contact point of his eyeglasses on the right side of his nose. It has a slight area of drainage in the center. We will refer for excision because of this ongoing irritation.  Psychiatric: He has a normal mood and affect. His behavior is normal. Judgment and thought content normal.  Nursing note and vitals reviewed.  BP (!) 151/72 (BP Location: Left Arm)   Pulse 68   Temp (!) 96.9 F (36.1 C) (Oral)   Ht 6' (1.829 m)  Wt 185 lb (83.9 kg)   BMI 25.09 kg/m         Assessment & Plan:  1. Chronic anticoagulation -Continue with current Coumadin dose. If patient gets an injection for a nerve block to his left leg we will ask that he hold his Coumadin for no more than 3 days. - CBC with Differential/Platelet - CoaguChek XS/INR Waived  2. hypertension -The blood pressure is elevated on the systolic side slightly today and but there be no change in treatment - BMP8+EGFR - CBC with Differential/Platelet - Hepatic function panel  3. Pure hypercholesterolemia -Continue current treatment pending results of lab work - CBC with Differential/Platelet - Lipid panel  4. Vitamin D deficiency -Continue vitamin D replacement - CBC with Differential/Platelet - VITAMIN D 25 Hydroxy (Vit-D Deficiency, Fractures)  5. Thrombocytopenia ([facility]) -No bleeding issues noted by patient. - CBC with Differential/Platelet  6. Chronic atrial fibrillation ([place]) -Follow-up with cardiology as planned and continue with Coumadin as directed - CoaguChek XS/INR Waived  7.  Hypothyroidism, unspecified type -Continue with current treatment pending results of lab work - Thyroid Panel With TSH  8. Neuropathy -Patient has seen the neurologist and will be seeing the neurosurgeon for a nerve block in his back  9. Skin lesion of face -Referred to dermatology - Ambulatory referral to Dermatology  10. Chronic viral conjunctivitis of left eye -Drops for infection to be used as directed  No orders of the defined types were placed in this encounter.  Patient Instructions                        Medicare Annual Wellness Visit  [facility] and the medical providers at [facility] strive to bring you the best medical care.  In doing so we not only want to address your current medical conditions and concerns but also to detect new conditions early and prevent illness, disease and health-related problems.    Medicare offers a yearly Wellness Visit which allows our clinical staff to assess your need for preventative services including immunizations, lifestyle education, counseling to decrease risk of preventable diseases and screening for fall risk and other medical concerns.    This visit is provided free of charge (no copay) for all Medicare recipients. The clinical pharmacists at [facility] have begun to conduct these Wellness Visits which will also include a thorough review of all your medications.    As you primary medical provider recommend that you make an appointment for your Annual Wellness Visit if you have not done so already this year.  You may set up this appointment before you leave today or you may call back ([phone]) and schedule an appointment.  Please make sure when you call that you mention that you are scheduling your Annual Wellness Visit with the clinical pharmacist so that the appointment may be made for the proper length of time.     Continue current medications. Continue good therapeutic lifestyle  changes which include good diet and exercise. Fall precautions discussed with patient. If an FOBT was given today- please return it to our front desk. If you are over 23 years old - you may need Prevnar 6 or the adult Pneumonia vaccine.  **Flu shots are available--- please call and schedule a FLU-CLINIC appointment**  After your visit with [place] today you will receive a survey in the mail or online from [employer] regarding your care with [place]. Please take a moment to fill  this out. Your feedback is very important to [place] as you can help [place] better understand your patient needs as well as improve your experience and satisfaction. WE CARE ABOUT YOU!!!   Anticoagulation Warfarin Dose Instructions as of 08/27/2017      [name] Tue Wed Thu Fri Sat   New Dose 5 mg 2.5 mg 5 mg 5 mg 5 mg 2.5 mg 5 mg    Description   Continue taking your warfarin the same way  INR today is 2.1 (goal: 2.0-2.5)    We will discuss with Dr. Maryjean Ka to leave the Coumadin off for 3 days only and not 5 days. They should call you with a time to come in for the injection. Continue to follow-up with cardiology Limit the second toe to the great toe with the [place] as directed and do this screening for at least 2-3 more weeks and wear a hard sole shoe Use eyedrops as directed We will contact the neurosurgeon regarding the holding of Coumadin prior to your nerve block and either we or them will let you know exactly what to do as far as holding your Coumadin. We're going to recommend that you hold it for 3 days.  Arrie Senate MD

## 2017-08-28 LAB — CBC WITH DIFFERENTIAL/PLATELET
Basophils Absolute: 0 10*3/uL (ref 0.0–0.2)
Basos: 0 %
EOS (ABSOLUTE): 0.1 10*3/uL (ref 0.0–0.4)
Eos: 2 %
Hematocrit: 42.1 % (ref 37.5–51.0)
Hemoglobin: 13.4 g/dL (ref 13.0–17.7)
Immature Grans (Abs): 0 10*3/uL (ref 0.0–0.1)
Immature Granulocytes: 0 %
Lymphocytes Absolute: 2.3 10*3/uL (ref 0.7–3.1)
Lymphs: 39 %
MCH: 29.3 pg (ref 26.6–33.0)
MCHC: 31.8 g/dL (ref 31.5–35.7)
MCV: 92 fL (ref 79–97)
Monocytes Absolute: 0.4 10*3/uL (ref 0.1–0.9)
Monocytes: 7 %
Neutrophils Absolute: 3.1 10*3/uL (ref 1.4–7.0)
Neutrophils: 52 %
Platelets: 156 10*3/uL (ref 150–379)
RBC: 4.57 x10E6/uL (ref 4.14–5.80)
RDW: 15.9 % — ABNORMAL HIGH (ref 12.3–15.4)
WBC: 5.8 10*3/uL (ref 3.4–10.8)

## 2017-08-28 LAB — BMP8+EGFR
BUN/Creatinine Ratio: 10 (ref 10–24)
BUN: 9 mg/dL (ref 8–27)
CO2: 28 mmol/L (ref 20–29)
Calcium: 9.5 mg/dL (ref 8.6–10.2)
Chloride: 103 mmol/L (ref 96–106)
Creatinine, Ser: 0.91 mg/dL (ref 0.76–1.27)
GFR calc Af Amer: 88 mL/min/{1.73_m2} (ref >59)
GFR calc non Af Amer: 76 mL/min/{1.73_m2} (ref >59)
Glucose: 111 mg/dL — ABNORMAL HIGH (ref 65–99)
Potassium: 4 mmol/L (ref 3.5–5.2)
Sodium: 146 mmol/L — ABNORMAL HIGH (ref 134–144)

## 2017-08-28 LAB — HEPATIC FUNCTION PANEL
ALT: 13 IU/L (ref 0–44)
AST: 21 IU/L (ref 0–40)
Albumin: 4.2 g/dL (ref 3.5–4.7)
Alkaline Phosphatase: 99 IU/L (ref 39–117)
Bilirubin Total: 0.5 mg/dL (ref 0.0–1.2)
Bilirubin, Direct: 0.16 mg/dL (ref 0.00–0.40)
Total Protein: 6.4 g/dL (ref 6.0–8.5)

## 2017-08-28 LAB — LIPID PANEL
Chol/HDL Ratio: 3.9 ratio (ref 0.0–5.0)
Cholesterol, Total: 182 mg/dL (ref 100–199)
HDL: 47 mg/dL (ref >39)
LDL Calculated: 117 mg/dL — ABNORMAL HIGH (ref 0–99)
Triglycerides: 91 mg/dL (ref 0–149)
VLDL Cholesterol Cal: 18 mg/dL (ref 5–40)

## 2017-08-28 LAB — VITAMIN D 25 HYDROXY (VIT D DEFICIENCY, FRACTURES): Vit D, 25-Hydroxy: 78.2 ng/mL (ref 30.0–100.0)

## 2017-09-18 ENCOUNTER — Other Ambulatory Visit: Payer: Self-pay | Admitting: Dermatology

## 2017-09-18 DIAGNOSIS — D492 Neoplasm of unspecified behavior of bone, soft tissue, and skin: Secondary | ICD-10-CM | POA: Diagnosis not present

## 2017-09-18 DIAGNOSIS — L570 Actinic keratosis: Secondary | ICD-10-CM | POA: Diagnosis not present

## 2017-09-18 DIAGNOSIS — D229 Melanocytic nevi, unspecified: Secondary | ICD-10-CM | POA: Diagnosis not present

## 2017-09-18 DIAGNOSIS — D692 Other nonthrombocytopenic purpura: Secondary | ICD-10-CM | POA: Diagnosis not present

## 2017-09-18 DIAGNOSIS — L820 Inflamed seborrheic keratosis: Secondary | ICD-10-CM | POA: Diagnosis not present

## 2017-10-04 DIAGNOSIS — Z7901 Long term (current) use of anticoagulants: Secondary | ICD-10-CM | POA: Diagnosis not present

## 2017-10-04 DIAGNOSIS — I119 Hypertensive heart disease without heart failure: Secondary | ICD-10-CM | POA: Diagnosis not present

## 2017-10-04 DIAGNOSIS — E785 Hyperlipidemia, unspecified: Secondary | ICD-10-CM | POA: Diagnosis not present

## 2017-10-04 DIAGNOSIS — I2510 Atherosclerotic heart disease of native coronary artery without angina pectoris: Secondary | ICD-10-CM | POA: Diagnosis not present

## 2017-10-04 DIAGNOSIS — I482 Chronic atrial fibrillation: Secondary | ICD-10-CM | POA: Diagnosis not present

## 2017-10-04 DIAGNOSIS — I495 Sick sinus syndrome: Secondary | ICD-10-CM | POA: Diagnosis not present

## 2017-10-04 DIAGNOSIS — Z951 Presence of aortocoronary bypass graft: Secondary | ICD-10-CM | POA: Diagnosis not present

## 2017-10-04 DIAGNOSIS — Z8673 Personal history of transient ischemic attack (TIA), and cerebral infarction without residual deficits: Secondary | ICD-10-CM | POA: Diagnosis not present

## 2017-10-08 ENCOUNTER — Ambulatory Visit (INDEPENDENT_AMBULATORY_CARE_PROVIDER_SITE_OTHER): Payer: Medicare Other | Admitting: *Deleted

## 2017-10-08 DIAGNOSIS — Z7901 Long term (current) use of anticoagulants: Secondary | ICD-10-CM | POA: Diagnosis not present

## 2017-10-08 DIAGNOSIS — I482 Chronic atrial fibrillation, unspecified: Secondary | ICD-10-CM

## 2017-10-08 DIAGNOSIS — Z23 Encounter for immunization: Secondary | ICD-10-CM

## 2017-10-08 LAB — COAGUCHEK XS/INR WAIVED
INR: 2.1 — ABNORMAL HIGH (ref 0.9–1.1)
Prothrombin Time: 25.2 s

## 2017-10-08 NOTE — Progress Notes (Signed)
Subjective:     Indication: atrial fibrillation Bleeding signs/symptoms: None Thromboembolic signs/symptoms: None  Missed Coumadin doses: None Medication changes: no Dietary changes: no Bacterial/viral infection: no Other concerns: no  The following portions of the patient's history were reviewed and updated as appropriate: allergies and current medications.  Review of Systems Pertinent items are noted in HPI.   Objective:    INR Today: 2.1 Current dose: 2.5 mg on Mon and Fri and 68mall other days    Assessment:    Therapeutic INR for goal of 2.0 - 2.5   Plan:    1. New dose: no change   2. Next INR: 1 month    KChong Sicilian RN

## 2017-10-08 NOTE — Patient Instructions (Signed)
Anticoagulation Warfarin Dose Instructions as of 10/08/2017      Dalton Vaughan Tue Wed Thu Fri Sat   New Dose 5 mg 2.5 mg 5 mg 5 mg 5 mg 2.5 mg 5 mg    Description   Continue taking 1/2 of a tablet on Monday and Friday and 1 whole tablet all other days.   INR today is 2.1 (goal: 2.0-2.5)  Return on 11/05/17 at 8:00

## 2017-10-11 ENCOUNTER — Telehealth: Payer: Self-pay | Admitting: Family Medicine

## 2017-11-05 ENCOUNTER — Ambulatory Visit (INDEPENDENT_AMBULATORY_CARE_PROVIDER_SITE_OTHER): Payer: Medicare Other | Admitting: *Deleted

## 2017-11-05 DIAGNOSIS — Z7901 Long term (current) use of anticoagulants: Secondary | ICD-10-CM

## 2017-11-05 DIAGNOSIS — I482 Chronic atrial fibrillation, unspecified: Secondary | ICD-10-CM

## 2017-11-05 DIAGNOSIS — E079 Disorder of thyroid, unspecified: Secondary | ICD-10-CM | POA: Diagnosis not present

## 2017-11-05 LAB — COAGUCHEK XS/INR WAIVED
INR: 2 — ABNORMAL HIGH (ref 0.9–1.1)
Prothrombin Time: 24.6 s

## 2017-11-05 MED ORDER — TOBRAMYCIN-DEXAMETHASONE 0.3-0.1 % OP OINT: TOPICAL_OINTMENT | OPHTHALMIC | 1 refills | Status: DC

## 2017-11-05 MED ORDER — DOXAZOSIN MESYLATE 8 MG PO TABS: 8.0000 mg | ORAL_TABLET | Freq: Every day | ORAL | 1 refills | Status: DC

## 2017-11-05 NOTE — Progress Notes (Signed)
Subjective:     Indication: atrial fibrillation Bleeding signs/symptoms: None Thromboembolic signs/symptoms: None  Missed Coumadin doses: None Medication changes: no Dietary changes: no Bacterial/viral infection: no Other concerns: no  The following portions of the patient's history were reviewed and updated as appropriate: allergies and current medications.  Review of Systems Pertinent items are noted in HPI.   Objective:    INR Today: 2.0 Current dose: 2.30m on Friday and 593mall other days    Assessment:    Therapeutic INR for goal of 2.0-2.5   Plan:    1. New dose: no change   2. Next INR: 1 month    KrChong SicilianRN

## 2017-11-20 ENCOUNTER — Ambulatory Visit (INDEPENDENT_AMBULATORY_CARE_PROVIDER_SITE_OTHER): Payer: Medicare Other | Admitting: Nurse Practitioner

## 2017-11-20 ENCOUNTER — Encounter: Payer: Self-pay | Admitting: Nurse Practitioner

## 2017-11-20 VITALS — BP 146/75 | HR 70 | Temp 96.9°F | Ht 73.0 in | Wt 187.8 lb

## 2017-11-20 DIAGNOSIS — J209 Acute bronchitis, unspecified: Secondary | ICD-10-CM | POA: Diagnosis not present

## 2017-11-20 MED ORDER — DOXYCYCLINE HYCLATE 100 MG PO TABS: 100.0000 mg | ORAL_TABLET | Freq: Two times a day (BID) | ORAL | 0 refills | Status: DC

## 2017-11-20 NOTE — Patient Instructions (Signed)

## 2017-11-20 NOTE — Progress Notes (Signed)
Subjective:     Dalton Vaughan is a 83 y.o. male here for evaluation of a cough. Onset of symptoms was 1 week ago. Symptoms have been unchanged since that time. The cough is productive of yellow and green sputum and is aggravated by cold air. Associated symptoms include: change in voice, chills, postnasal drip and sputum production. Patient does not have a history of asthma. Patient does not have a history of environmental allergens. Patient has not traveled recently. Patient does not have a history of smoking. Patient has had a previous chest x-[place]. Patient has not had a PPD done.  The following portions of the patient's history were reviewed and updated as appropriate: allergies, current medications, past family history, past medical history, past social history, past surgical history and problem list.  Review of Systems Pertinent items noted in HPI and remainder of comprehensive ROS otherwise negative.    Objective:     BP (!) 146/75   Pulse 70   Temp (!) 96.9 F (36.1 C) (Oral)   Ht 6' 1"  (1.854 m)   Wt 187 lb 12.8 oz (85.2 kg)   SpO2 97%   BMI 24.78 kg/m  General appearance: alert, cooperative and no distress Eyes: conjunctivae/corneas clear. PERRL, EOM's intact. Fundi benign. Ears: normal TM's and external ear canals both ears Nose: clear discharge, moderate congestion, turbinates red, no sinus tenderness Throat: lips, mucosa, and tongue normal; teeth and gums normal Neck: no adenopathy, no carotid bruit, no JVD, supple, symmetrical, trachea midline and thyroid not enlarged, symmetric, no tenderness/mass/nodules Lungs: clear to auscultation bilaterally and dry cough Heart: regular rate and rhythm, S1, S2 normal, no murmur, click, rub or gallop    Assessment:    Acute Bronchitis    Plan:     1. Take meds as prescribed 2. Use a cool mist humidifier especially during the winter months and when heat has been humid. 3. Use saline nose sprays frequently 4. Saline irrigations of  the nose can be very helpful if done frequently.  * 4X daily for 1 week*  * Use of a nettie pot can be helpful with this. Follow directions with this* 5. Drink plenty of fluids 6. Keep thermostat turn down low 7.For any cough or congestion  Use plain Mucinex- regular strength or max strength is fine   * Children- consult with Pharmacist for dosing 8. For fever or aces or pains- take tylenol or ibuprofen appropriate for age and weight.  * for fevers greater than 101 orally you may alternate ibuprofen and tylenol every  3 hours.   Meds ordered this encounter  Medications  . doxycycline (VIBRA-TABS) 100 MG tablet    Sig: Take 1 tablet (100 mg total) by mouth 2 (two) times daily. 1 po bid    Dispense:  20 tablet    Refill:  0    Order Specific Question:   Supervising Provider    Answer:   Eustaquio Maize [[id number]]   Mary-Margaret Hassell Done, FNP

## 2017-11-25 ENCOUNTER — Ambulatory Visit: Payer: Medicare Other | Admitting: Adult Health

## 2017-12-06 ENCOUNTER — Ambulatory Visit (INDEPENDENT_AMBULATORY_CARE_PROVIDER_SITE_OTHER): Payer: Medicare Other | Admitting: Pharmacist Clinician (PhC)/ Clinical Pharmacy Specialist

## 2017-12-06 DIAGNOSIS — I482 Chronic atrial fibrillation, unspecified: Secondary | ICD-10-CM

## 2017-12-06 DIAGNOSIS — Z7901 Long term (current) use of anticoagulants: Secondary | ICD-10-CM

## 2017-12-06 LAB — COAGUCHEK XS/INR WAIVED
INR: 2.9 — ABNORMAL HIGH (ref 0.9–1.1)
Prothrombin Time: 34.3 s

## 2017-12-06 NOTE — Patient Instructions (Signed)
Description   No warfarin tomorrow, then resume regular schedule  INR today 2.9 a little thin today

## 2017-12-20 ENCOUNTER — Other Ambulatory Visit: Payer: Self-pay | Admitting: Family Medicine

## 2017-12-23 ENCOUNTER — Other Ambulatory Visit: Payer: Self-pay | Admitting: Family Medicine

## 2017-12-25 ENCOUNTER — Other Ambulatory Visit: Payer: Self-pay | Admitting: *Deleted

## 2017-12-25 MED ORDER — TRIAMTERENE-HCTZ 37.5-25 MG PO TABS: 1.0000 | ORAL_TABLET | Freq: Every day | ORAL | 0 refills | Status: DC

## 2018-01-08 ENCOUNTER — Ambulatory Visit (INDEPENDENT_AMBULATORY_CARE_PROVIDER_SITE_OTHER): Payer: Medicare Other | Admitting: Family Medicine

## 2018-01-08 ENCOUNTER — Encounter: Payer: Self-pay | Admitting: Family Medicine

## 2018-01-08 VITALS — BP 150/75 | HR 70 | Temp 97.2°F | Ht 73.0 in | Wt 185.0 lb

## 2018-01-08 DIAGNOSIS — D696 Thrombocytopenia, unspecified: Secondary | ICD-10-CM | POA: Diagnosis not present

## 2018-01-08 DIAGNOSIS — H6121 Impacted cerumen, right ear: Secondary | ICD-10-CM | POA: Diagnosis not present

## 2018-01-08 DIAGNOSIS — E079 Disorder of thyroid, unspecified: Secondary | ICD-10-CM | POA: Diagnosis not present

## 2018-01-08 DIAGNOSIS — Z7901 Long term (current) use of anticoagulants: Secondary | ICD-10-CM | POA: Diagnosis not present

## 2018-01-08 DIAGNOSIS — Z8673 Personal history of transient ischemic attack (TIA), and cerebral infarction without residual deficits: Secondary | ICD-10-CM | POA: Diagnosis not present

## 2018-01-08 DIAGNOSIS — M4726 Other spondylosis with radiculopathy, lumbar region: Secondary | ICD-10-CM

## 2018-01-08 DIAGNOSIS — I700 Atherosclerosis of aorta: Secondary | ICD-10-CM | POA: Diagnosis not present

## 2018-01-08 DIAGNOSIS — E559 Vitamin D deficiency, unspecified: Secondary | ICD-10-CM | POA: Diagnosis not present

## 2018-01-08 DIAGNOSIS — E039 Hypothyroidism, unspecified: Secondary | ICD-10-CM

## 2018-01-08 DIAGNOSIS — I482 Chronic atrial fibrillation, unspecified: Secondary | ICD-10-CM

## 2018-01-08 DIAGNOSIS — E7800 Pure hypercholesterolemia, unspecified: Secondary | ICD-10-CM

## 2018-01-08 LAB — COAGUCHEK XS/INR WAIVED
INR: 2.1 — ABNORMAL HIGH (ref 0.9–1.1)
Prothrombin Time: 25.6 s

## 2018-01-08 MED ORDER — MIRTAZAPINE 15 MG PO TABS: ORAL_TABLET | ORAL | 3 refills | Status: DC

## 2018-01-08 NOTE — Patient Instructions (Addendum)
Medicare Annual Wellness Visit  [facility] and the medical providers at [facility] strive to bring you the best medical care.  In doing so we not only want to address your current medical conditions and concerns but also to detect new conditions early and prevent illness, disease and health-related problems.    Medicare offers a yearly Wellness Visit which allows our clinical staff to assess your need for preventative services including immunizations, lifestyle education, counseling to decrease risk of preventable diseases and screening for fall risk and other medical concerns.    This visit is provided free of charge (no copay) for all Medicare recipients. The clinical pharmacists at [facility] have begun to conduct these Wellness Visits which will also include a thorough review of all your medications.    As you primary medical provider recommend that you make an appointment for your Annual Wellness Visit if you have not done so already this year.  You may set up this appointment before you leave today or you may call back ([phone]) and schedule an appointment.  Please make sure when you call that you mention that you are scheduling your Annual Wellness Visit with the clinical pharmacist so that the appointment may be made for the proper length of time.     Continue current medications. Continue good therapeutic lifestyle changes which include good diet and exercise. Fall precautions discussed with patient. If an FOBT was given today- please return it to our front desk. If you are over 41 years old - you may need Prevnar 69 or the adult Pneumonia vaccine.  **Flu shots are available--- please call and schedule a FLU-CLINIC appointment**  After your visit with [place] today you will receive a survey in the mail or online from [employer] regarding your care with [place]. Please take a moment to fill this out. Your feedback is very  important to [place] as you can help [place] better understand your patient needs as well as improve your experience and satisfaction. WE CARE ABOUT YOU!!!   For heartburn, the patient can take ranitidine, 150 mg, the equate brand 1 twice daily before breakfast and supper as needed for heartburn or prior to eating something that he knows will irritate his stomach. We will discuss the Coumadin management in light of your need to have an injection in your back with the cardiologist.  Description   resume regular schedule  INR today 2.1

## 2018-01-08 NOTE — Progress Notes (Signed)
Subjective:    Patient ID: Dalton Vaughan, male    DOB: [birth year], 84 y.o.   MRN: [id number]  HPI Pt here for follow up and management of chronic medical problems which includes a fib, hypothyroid and hyperlipidemia. He is taking medication regularly.  The patient is doing well overall and mostly complains of back and bilateral leg pain.  He will get lab work today along with a thyroid profile and will be given an FOBT to return.  An MRI x-Jakaden of his low back from this past year was reviewed again with the patient.  Patient continues to have trouble with his back pain and leg pain and was scheduled for an injection but they refused to do it because he cannot be off the Coumadin any more than 3 days.  I will discussed this with his cardiologist and see if there are other options that we can follow through with so that we can get the injections done by the neurosurgeon.  He has significant degenerative changes affecting the foramina bilaterally at several levels.  The patient denies any chest pain or shortness of breath.  He also denies any trouble with swallowing nausea vomiting diarrhea or blood in the stool but does have ongoing bouts with heartburn for which he takes Rolaids or something else over-the-counter to help this.  He denies any trouble with passing his water other than frequency which she has had for years.  The patient indicates that his blood pressures at home are running in the 130s over the 70s generally speaking.    Patient Active Problem List   Diagnosis Date Noted  . Thyroid disease   . Aortic atherosclerosis ([place]) 05/02/2017  . Thrombocytopenia ([place]) 07/10/2016  . Hereditary and idiopathic peripheral neuropathy 10/16/2015  . Mononeuropathy 10/04/2015  . Allergic rhinitis 05/06/2014  . Depression 08/03/2013  . Sick sinus syndrome ([place]) 07/31/2012  . Hypertensive heart disease without CHF   . Chronic atrial fibrillation ([place])   . CAD (coronary artery disease)   . History of  TIAs   . Lumbar disc disease   . GERD (gastroesophageal reflux disease)   . Chronic anticoagulation   . BPH (benign prostatic hypertrophy)   . Hyperlipidemia    Outpatient Encounter Medications as of 01/08/2018  Medication Sig  . amLODipine (NORVASC) 5 MG tablet TAKE ONE-HALF (1/2) TABLET TWICE A DAY (Patient taking differently: 1/2 tablet in the morning and 1 tablet in the evening)  . Calcium Carb-Cholecalciferol (CALCIUM + D3) 600-200 MG-UNIT TABS Take 0.25 tablets by mouth daily.  . Cholecalciferol (VITAMIN D-3) 5000 UNITS TABS Take 1 tablet by mouth daily.   Marland Kitchen doxazosin (CARDURA) 8 MG tablet Take 1 tablet (8 mg total) at bedtime by mouth.  . levothyroxine (SYNTHROID, LEVOTHROID) 50 MCG tablet Take 1 tablet (50 mcg total) by mouth daily.  . mirtazapine (REMERON) 15 MG tablet TAKE 1 TABLET DAILY AT BEDTIME AS DIRECTED  . Multiple Vitamin (MULTIVITAMIN) tablet Take 1 tablet by mouth daily.    . ramipril (ALTACE) 10 MG capsule TAKE 1 CAPSULE DAILY  . rosuvastatin (CRESTOR) 5 MG tablet TAKE 1 TABLET DAILY AS DIRECTED  . tobramycin-dexamethasone (TOBRADEX) ophthalmic ointment Apply to the left eye at bedtime  . triamterene-hydrochlorothiazide (MAXZIDE-25) 37.5-25 MG tablet Take 1 tablet by mouth daily.  . vitamin C (ASCORBIC ACID) 500 MG tablet Take 500 mg by mouth daily. Take 1/4 tablet by mouth daily.  Marland Kitchen warfarin (COUMADIN) 5 MG tablet TAKE AS DIRECTED  . [DISCONTINUED] doxycycline (VIBRA-TABS) 100  MG tablet Take 1 tablet (100 mg total) by mouth 2 (two) times daily. 1 po bid  . [DISCONTINUED] trimethoprim-polymyxin b (POLYTRIM) ophthalmic solution Place 1 drop into the left eye 4 (four) times daily. For 7 days   No facility-administered encounter medications on file as of 01/08/2018.       Review of Systems  Constitutional: Negative.   HENT: Negative.   Eyes: Negative.   Respiratory: Negative.   Cardiovascular: Negative.   Gastrointestinal: Negative.   Endocrine: Negative.     Genitourinary: Negative.   Musculoskeletal: Positive for back pain (and leg pain - has had MRI and followed Dr Vertell Limber ).  Skin: Negative.   Allergic/Immunologic: Negative.   Neurological: Negative.   Hematological: Negative.   Psychiatric/Behavioral: Negative.        Objective:   Physical Exam  Constitutional: He is oriented to person, place, and time. He appears well-developed and well-nourished.  The patient is pleasant and alert and looks much younger than his stated age of 84 years.  HENT:  Head: Normocephalic and atraumatic.  Right Ear: External ear normal.  Left Ear: External ear normal.  Nose: Nose normal.  Mouth/Throat: Oropharynx is clear and moist. No oropharyngeal exudate.  Ear wax right ear  Eyes: Conjunctivae and EOM are normal. Pupils are equal, round, and reactive to light. Right eye exhibits no discharge. Left eye exhibits no discharge. No scleral icterus.  Neck: Normal range of motion. Neck supple. No thyromegaly present.  No bruits thyromegaly or anterior cervical adenopathy  Cardiovascular: Normal rate, regular rhythm, normal heart sounds and intact distal pulses.  No murmur heard. Heart is irregular irregular at 72/min  Pulmonary/Chest: Effort normal and breath sounds normal. No respiratory distress. He has no wheezes. He has no rales. He exhibits no tenderness.  Lungs are clear anteriorly and posteriorly and there is no axillary adenopathy  Abdominal: Soft. Bowel sounds are normal. He exhibits no mass. There is no tenderness. There is no rebound and no guarding.  No abdominal tenderness masses bruits or inguinal adenopathy  Musculoskeletal: He exhibits no edema or tenderness.  Some back pain as well as knee pain with leg raising and hip abduction bilaterally  Lymphadenopathy:    He has no cervical adenopathy.  Neurological: He is alert and oriented to person, place, and time. He has normal reflexes. No cranial nerve deficit.  Skin: Skin is warm and dry. No  rash noted.  Psychiatric: He has a normal mood and affect. His behavior is normal. Judgment and thought content normal.  Nursing note and vitals reviewed.  BP (!) 150/75 (BP Location: Left Arm)   Pulse 70   Temp (!) 97.2 F (36.2 C) (Oral)   Ht 6' 1"  (1.854 m)   Wt 185 lb (83.9 kg)   BMI 24.41 kg/m         Assessment & Plan:   1. Chronic atrial fibrillation (HCC) -INR today and continue to follow-up with Dr. Wynonia Lawman - CBC with Differential/Platelet - BMP8+EGFR  2. Pure hypercholesterolemia -Continue aggressive therapeutic lifestyle changes and statin drug pending results of lab work - CBC with Differential/Platelet - Lipid panel - Hepatic function panel  3. Hypothyroidism, unspecified type -Continue current treatment pending results of lab work - CBC with Differential/Platelet - Thyroid Panel With TSH  4. Thrombocytopenia ([place]) -No history of any bleeding issues. - CBC with Differential/Platelet  5. Vitamin D deficiency -Continue vitamin D replacement pending results of lab work - CBC with Differential/Platelet - VITAMIN D 25 Hydroxy (Vit-D  Deficiency, Fractures)  6. Thyroid disease -Continue thyroid replacement pending results of lab work - CBC with Differential/Platelet  7. Osteoarthritis of spine with radiculopathy, lumbar region -Discuss Coumadin management in light of injections needed for back from neurosurgeon and get back in touch with patient  8. Aortic atherosclerosis ([place]) -Continue aggressive therapeutic lifestyle changes  9. History of TIAs -Continue with Coumadin treatment  10. Right ear impacted cerumen -Ear irrigation  Meds ordered this encounter  Medications  . mirtazapine (REMERON) 15 MG tablet    Sig: TAKE 1 TABLET DAILY AT BEDTIME AS DIRECTED    Dispense:  90 tablet    Refill:  3   Patient Instructions                       Medicare Annual Wellness Visit  [facility] and the medical providers at [facility] strive to bring you the best medical care.  In doing so we not only want to address your current medical conditions and concerns but also to detect new conditions early and prevent illness, disease and health-related problems.    Medicare offers a yearly Wellness Visit which allows our clinical staff to assess your need for preventative services including immunizations, lifestyle education, counseling to decrease risk of preventable diseases and screening for fall risk and other medical concerns.    This visit is provided free of charge (no copay) for all Medicare recipients. The clinical pharmacists at [facility] have begun to conduct these Wellness Visits which will also include a thorough review of all your medications.    As you primary medical provider recommend that you make an appointment for your Annual Wellness Visit if you have not done so already this year.  You may set up this appointment before you leave today or you may call back ([phone]) and schedule an appointment.  Please make sure when you call that you mention that you are scheduling your Annual Wellness Visit with the clinical pharmacist so that the appointment may be made for the proper length of time.     Continue current medications. Continue good therapeutic lifestyle changes which include good diet and exercise. Fall precautions discussed with patient. If an FOBT was given today- please return it to our front desk. If you are over 39 years old - you may need Prevnar 30 or the adult Pneumonia vaccine.  **Flu shots are available--- please call and schedule a FLU-CLINIC appointment**  After your visit with [place] today you will receive a survey in the mail or online from [employer] regarding your care with [place]. Please take a moment to fill this out. Your feedback is very important to [place] as you can help [place] better understand your patient needs as well as improve your experience and satisfaction. WE  CARE ABOUT YOU!!!   For heartburn, the patient can take ranitidine, 150 mg, the equate brand 1 twice daily before breakfast and supper as needed for heartburn or prior to eating something that he knows will irritate his stomach. We will discuss the Coumadin management in light of your need to have an injection in your back with the cardiologist.    Arrie Senate MD

## 2018-01-09 LAB — CBC WITH DIFFERENTIAL/PLATELET
Basophils Absolute: 0 10*3/uL (ref 0.0–0.2)
Basos: 0 %
EOS (ABSOLUTE): 0.4 10*3/uL (ref 0.0–0.4)
Eos: 6 %
Hematocrit: 39.8 % (ref 37.5–51.0)
Hemoglobin: 13.2 g/dL (ref 13.0–17.7)
Immature Grans (Abs): 0 10*3/uL (ref 0.0–0.1)
Immature Granulocytes: 0 %
Lymphocytes Absolute: 2.4 10*3/uL (ref 0.7–3.1)
Lymphs: 34 %
MCH: 29.5 pg (ref 26.6–33.0)
MCHC: 33.2 g/dL (ref 31.5–35.7)
MCV: 89 fL (ref 79–97)
Monocytes Absolute: 0.5 10*3/uL (ref 0.1–0.9)
Monocytes: 7 %
Neutrophils Absolute: 3.8 10*3/uL (ref 1.4–7.0)
Neutrophils: 53 %
Platelets: 155 10*3/uL (ref 150–379)
RBC: 4.47 x10E6/uL (ref 4.14–5.80)
RDW: 15.8 % — ABNORMAL HIGH (ref 12.3–15.4)
WBC: 7.1 10*3/uL (ref 3.4–10.8)

## 2018-01-09 LAB — BMP8+EGFR
BUN/Creatinine Ratio: 12 (ref 10–24)
BUN: 11 mg/dL (ref 8–27)
CO2: 27 mmol/L (ref 20–29)
Calcium: 9.5 mg/dL (ref 8.6–10.2)
Chloride: 102 mmol/L (ref 96–106)
Creatinine, Ser: 0.94 mg/dL (ref 0.76–1.27)
GFR calc Af Amer: 85 mL/min/{1.73_m2} (ref >59)
GFR calc non Af Amer: 73 mL/min/{1.73_m2} (ref >59)
Glucose: 83 mg/dL (ref 65–99)
Potassium: 3.7 mmol/L (ref 3.5–5.2)
Sodium: 146 mmol/L — ABNORMAL HIGH (ref 134–144)

## 2018-01-09 LAB — LIPID PANEL
Chol/HDL Ratio: 3.4 ratio (ref 0.0–5.0)
Cholesterol, Total: 168 mg/dL (ref 100–199)
HDL: 49 mg/dL (ref >39)
LDL Calculated: 105 mg/dL — ABNORMAL HIGH (ref 0–99)
Triglycerides: 70 mg/dL (ref 0–149)
VLDL Cholesterol Cal: 14 mg/dL (ref 5–40)

## 2018-01-09 LAB — THYROID PANEL WITH TSH
Free Thyroxine Index: 1.9 (ref 1.2–4.9)
T3 Uptake Ratio: 28 % (ref 24–39)
T4, Total: 6.9 ug/dL (ref 4.5–12.0)
TSH: 4.19 u[IU]/mL (ref 0.450–4.500)

## 2018-01-09 LAB — VITAMIN D 25 HYDROXY (VIT D DEFICIENCY, FRACTURES): Vit D, 25-Hydroxy: 79.3 ng/mL (ref 30.0–100.0)

## 2018-01-09 LAB — HEPATIC FUNCTION PANEL
ALT: 13 IU/L (ref 0–44)
AST: 23 IU/L (ref 0–40)
Albumin: 4.4 g/dL (ref 3.5–4.7)
Alkaline Phosphatase: 119 IU/L — ABNORMAL HIGH (ref 39–117)
Bilirubin Total: 0.4 mg/dL (ref 0.0–1.2)
Bilirubin, Direct: 0.13 mg/dL (ref 0.00–0.40)
Total Protein: 7 g/dL (ref 6.0–8.5)

## 2018-01-13 ENCOUNTER — Ambulatory Visit (INDEPENDENT_AMBULATORY_CARE_PROVIDER_SITE_OTHER): Payer: Medicare Other | Admitting: Adult Health

## 2018-01-13 ENCOUNTER — Encounter: Payer: Self-pay | Admitting: Adult Health

## 2018-01-13 VITALS — BP 170/80 | HR 73 | Wt 184.0 lb

## 2018-01-13 DIAGNOSIS — G609 Hereditary and idiopathic neuropathy, unspecified: Secondary | ICD-10-CM

## 2018-01-13 NOTE — Patient Instructions (Signed)
Your Plan:  Continue to monitor symptoms If your symptoms worsen or you develop new symptoms please let us know.   Thank you for coming to see [place] at [facility]. I hope we have been able to provide you high quality care today.  You may receive a patient satisfaction survey over the next few weeks. We would appreciate your feedback and comments so that we may continue to improve ourselves and the health of our patients.

## 2018-01-13 NOTE — Progress Notes (Signed)
PATIENT: Dalton Vaughan DOB: 1929/04/20  REASON FOR VISIT: follow up HISTORY FROM: patient  HISTORY OF PRESENT ILLNESS: Today 01/13/18 Dalton Vaughan is an 84 year old male with a history of peripheral neuropathy.  He returns today for follow-up.  He states that he did follow-up with Dr. Vertell Limber after his lumbar MRI.  Reports that he was really not a candidate for surgery but they were considering injections.  However the patient is on Coumadin and his cardiologist did not want him off the medication more than 5 days.  Therefore they were unable to do the injections.  The patient reports that he has been on Eliquis in the past but this caused dizziness.  The patient reports that he has primarily numbness in the lower extremities.  He reports occasionally he will have a tingling sensation but it does not cause him significant discomfort.  He reports that the numbness seems to be worse when he goes to bed.  He reports that his feet feel cold at bedtime.  He denies any significant changes with his gait or balance.  Denies any falls.  HISTORY Dalton Vaughan is a 84 y.o. male here as a referral from Dr. Laurance Flatten for possible TIA. He has a past medical history of hypertension, heart disease, atrial fibrillation on chronic Coumadin, heart bypass surgery, lumbar surgery for herniated disc, hyperlipidemia, GERD, hypothyroidism, chronic neck and low back pain.He was originally evaluated here for bilateral lower extremity numbness diagnosed with idiopathic peripheral neuropathy. Today he is here for a new problem incident that he had that he thinks was a TIA. He walked down to get his paper in the mornng. Felt like his right shoe was filled with grease, it was hard to walk, the right hand didn't want to work like normal. No numbness or sensory changes, symptoms resolved in 30 minutes, no confusion or aphasia or dysarthria or dysphagia. He has been on Coumadin for several years. He wasn;t so weak but felt like he was  slipping in grease and didin;t work correctly. He has a lot of back pain. He has paresthesias in his feet that are worsening. When he sits for too long he has radicular pain and weakness. No other focal neurologic deficits, associated symptoms, inciting events or modifiable factors.  Reviewed notes, labs and imaging from outside physicians, which showed:   Personally reviewed MRI of the brain images and agree with the following:  FINDINGS: Personally reviewed images of the brain and agree with the following: Brain: Negative for acute infarct. Scattered small white matter hyperintensities bilaterally consistent with microvascular ischemia. Chronic infarct in the thalamus bilaterally. Brainstem and cerebellum negative. Negative for hemorrhage or mass lesion.  Negative for hydrocephalus. Cerebral volume normal for age.  Vascular: Normal arterial flow void.  Skull and upper cervical spine: Negative  Sinuses/Orbits: Mild mucosal edema paranasal sinuses. Bilateral lens replacement.  Other: None  IMPRESSION: No acute intracranial abnormality. Moderate chronic microvascular ischemia.   Previous CC: Bilateral leg numbness  Interval history 02/2016: Here for numbness in his feet. follow up. HgbA1c 5.9. Labs normal including TSH, sed rate, b12, rpr, hep c, RF, heavy metals. B1, IFE/Spep, ANA and ANA comprehensive panel, lyme. He did not take the gabapentin I prescribed him and he started exercising and he feels better. He tries to change position more often and that helps. He has chronic low back pain that may be contributing. Reviewed all the labs above with patient. Discussed no risk factors for peripheral polyneuropathy except glucose intolerance. His restless  leg symptoms have improved with exercise. He has a tread mill and a bicycle at [employer] and some weights. He feels better.  HPI 09/2015: Dalton Vaughan is a 84 y.o. male here as a referral from Dr. Laurance Flatten for numbness in the legs.  He has a past medical history of hypertension, heart disease, atrial fibrillation on chronic Coumadin, heart bypass surgery, lumbar surgery for herniated disc, hyperlipidemia, GERD, hypothyroidism, chronic neck and low back pain. He has had this numbness since for years. The numbness in the feet actually started even earlier than the 70s maybe in the 60s. Started out in the right leg below the knee medially. Then later on he started getting the numbness in his feet especially worse in the evening. Feet get cold and numb. The bottom of the feet are affected. He has burning on the bottom of the feet. Feels like ice. Left is worse than the right. Worse at night and can't get comfortable and feels like he has to move his legs. He has pain in the left hip and groin area with soreness to touch. He has pain in the neck for several months. His feet bother him most at night. He takes remeron at night and that seems to exacerbate the symptoms. Keeps him awake most of the night. If he can go to sleep he sleeps well. Better during the day when moving around. Symptoms are getting worse. When he was younger he never drank alcohol excessively, he occasionally drinks a glass of wine now, not too often. He was in the [employer] and he has been to lots of places, been exposed to [employer]. He feels like he drags his feet, when he is walking he is not as steady. Balance is affected. He has pain in his hips and has diffculty sometimes. No recent falls. No FHx of neuromuscular disorders or neuropathy. Feels his neuropathy may be related to Agent Orange exposure.   Reviewed notes, labs and imaging from outside physicians, which showed: Patient has chronic left-sided pelvic pain with no significant resolution. He has been seen by GI specialist. He had a CT scan of the pelvis which did not show any etiology. He has completed physical therapy. He is on chronic Coumadin use. He has neck pain, mostly likely degenerative disc  disease. No radicular symptoms noted. Primary care was concerned with the symptoms patient was having in his feet and lower limbs, concern whether this could be a peripheral neuropathic process and referred him to neurology.   MRI pelvis 05/2015: IMPRESSION: No evidence of rectal/pelvic floor prolapse.  No evidence of perirectal fluid collection/abscess.  Sigmoid diverticulosis.  Low T2 signal in the left posterior apex of the peripheral gland of the prostate, nonspecific, possibly reflecting prostatitis although prostate cancer is not excluded. Correlate with PSA.  MRi of the brain (personally reviewed images and agree with the following):  1. No acute intracranial abnormality. 2. Chronic small vessel ischemic disease including involvement of the thalami.  BMP 02/2015 unremarkable TSH 02/2015 wnl LFTs 05/2015 nml  REVIEW OF SYSTEMS: Out of a complete 14 system review of symptoms, the patient complains only of the following symptoms, and all other reviewed systems are negative.  Frequency of urination, back pain, neck stiffness, acting out dreams, restless leg, leg swelling, cough, ringing in ears, runny nose, eye redness, bruise/bleed easily  ALLERGIES: Allergies  Allergen Reactions  . Paxil [Paroxetine Hcl] Palpitations  . Eliquis [Apixaban] Other (See Comments)    dizziness  . Penicillins  Said he is  Allergic" Natural combinations-extended release  . Pravachol [Pravastatin Sodium]   . Zetia [Ezetimibe]     myaalgias  . Vytorin [Ezetimibe-Simvastatin] Other (See Comments)    HOME MEDICATIONS: Outpatient Medications Prior to Visit  Medication Sig Dispense Refill  . amLODipine (NORVASC) 5 MG tablet TAKE ONE-HALF (1/2) TABLET TWICE A DAY (Patient taking differently: 1/2 tablet in the morning and 1 tablet in the evening) 90 tablet 3  . Calcium Carb-Cholecalciferol (CALCIUM + D3) 600-200 MG-UNIT TABS Take 0.25 tablets by mouth daily.    . Cholecalciferol (VITAMIN  D-3) 5000 UNITS TABS Take 1 tablet by mouth daily.     Marland Kitchen doxazosin (CARDURA) 8 MG tablet Take 1 tablet (8 mg total) at bedtime by mouth. 90 tablet 1  . levothyroxine (SYNTHROID, LEVOTHROID) 50 MCG tablet Take 1 tablet (50 mcg total) by mouth daily. 90 tablet 3  . mirtazapine (REMERON) 15 MG tablet TAKE 1 TABLET DAILY AT BEDTIME AS DIRECTED 90 tablet 3  . Multiple Vitamin (MULTIVITAMIN) tablet Take 1 tablet by mouth daily.      . ramipril (ALTACE) 10 MG capsule TAKE 1 CAPSULE DAILY 90 capsule 1  . rosuvastatin (CRESTOR) 5 MG tablet TAKE 1 TABLET DAILY AS DIRECTED 90 tablet 0  . tobramycin-dexamethasone (TOBRADEX) ophthalmic ointment Apply to the left eye at bedtime 3.5 g 1  . triamterene-hydrochlorothiazide (MAXZIDE-25) 37.5-25 MG tablet Take 1 tablet by mouth daily. 90 tablet 0  . vitamin C (ASCORBIC ACID) 500 MG tablet Take 500 mg by mouth daily. Take 1/4 tablet by mouth daily.    Marland Kitchen warfarin (COUMADIN) 5 MG tablet TAKE AS DIRECTED 90 tablet 0   No facility-administered medications prior to visit.     PAST MEDICAL HISTORY: Past Medical History:  Diagnosis Date  . Anal fissure   . Atrial fibrillation ([place])   . BPH (benign prostatic hypertrophy)   . CAD (coronary artery disease)    Dr. Wynonia Lawman  . Cataract   . Depression 2004  . Diverticulosis   . GERD (gastroesophageal reflux disease)   . History of TIAs   . Hyperlipidemia   . Hypertension   . Hypertensive heart disease without CHF   . Hypothyroidism   . Internal hemorrhoids   . Lumbar disc disease   . Oral bleeding 08/20/2012   Following molar tooth extraction July, 2013  . Ruptured disk 1995  . Shingles   . Thyroid disease   . Vitamin D deficiency   . Vitreous detachment     PAST SURGICAL HISTORY: Past Surgical History:  Procedure Laterality Date  . bilateral cataract surgery  6/07  . CORONARY ARTERY BYPASS GRAFT  12/99    Dr. Servando Snare   . cysloscopy  8/08  . EYE SURGERY    . HERNIA REPAIR  1997   right  . herninated  disc  1995  . PROSTATE BIOPSY  8/08  . TONSILLECTOMY AND ADENOIDECTOMY      FAMILY HISTORY: Family History  Problem Relation Age of Onset  . Cancer Brother        prostate  . Asthma Sister   . Heart disease Paternal Aunt   . Heart disease Paternal Uncle   . Hypertension Maternal Grandmother   . CVA Maternal Grandmother   . COPD Sister   . CVA Paternal Grandmother   . Colon cancer Neg Hx   . Colon polyps Neg Hx   . Kidney disease Neg Hx   . Esophageal cancer Neg Hx   . Gallbladder  disease Neg Hx   . Diabetes Neg Hx     SOCIAL HISTORY: Social History   Socioeconomic History  . Marital status: Married    Spouse name: [name]   . Number of children: 1  . Years of education: 29  . Highest education level: Not on file  Social Needs  . Financial resource strain: Not on file  . Food insecurity - worry: Not on file  . Food insecurity - inability: Not on file  . Transportation needs - medical: Not on file  . Transportation needs - non-medical: Not on file  Occupational History  . Occupation: Retired  Tobacco Use  . Smoking status: Former Smoker    Types: Cigarettes    Start date: 12/17/1948    Last attempt to quit: 12/17/1966    Years since quitting: 51.1  . Smokeless tobacco: Never Used  Substance and Sexual Activity  . Alcohol use: Yes    Alcohol/week: 0.6 oz    Types: 1 Standard drinks or equivalent per week    Comment: Occassionally  . Drug use: No  . Sexual activity: No  Other Topics Concern  . Not on file  Social History Narrative   Married.  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  Generalized: Well developed, in no acute distress   Neurological examination  Mentation: Alert oriented to time, place, history taking. Follows all commands speech and  language fluent Cranial nerve II-XII: Pupils were equal round reactive to light. Extraocular movements were full, visual field were full on confrontational test. Facial sensation and strength were normal. Uvula tongue midline. Head turning and shoulder shrug  were normal and symmetric. Motor: The motor testing reveals 5 over 5 strength of all 4 extremities. Good symmetric motor tone is noted throughout.  Sensory: Sensory testing is intact to soft touch on all 4 extremities. No evidence of extinction is noted.  Coordination: Cerebellar testing reveals good finger-nose-finger and heel-to-shin bilaterally.  Gait and station: Gait is normal.  Reflexes: Deep tendon reflexes are symmetric and normal bilaterally.   DIAGNOSTIC DATA (LABS, IMAGING, TESTING) - I reviewed patient records, labs, notes, testing and imaging myself where available.  Lab Results  Component Value Date   WBC 7.1 01/08/2018   HGB 13.2 01/08/2018   HCT 39.8 01/08/2018   MCV 89 01/08/2018   PLT 155 01/08/2018      Component Value Date/Time   NA 146 (H) 01/08/2018 1202   K 3.7 01/08/2018 1202   CL 102 01/08/2018 1202   CO2 27 01/08/2018 1202   GLUCOSE 83 01/08/2018 1202   GLUCOSE 87 04/22/2013 1156   BUN 11 01/08/2018 1202   CREATININE 0.94 01/08/2018 1202   CREATININE 0.74 04/22/2013 1156   CALCIUM 9.5 01/08/2018 1202   PROT 7.0 01/08/2018 1202   ALBUMIN 4.4 01/08/2018 1202   AST 23 01/08/2018 1202   ALT 13 01/08/2018 1202   ALKPHOS 119 (H) 01/08/2018 1202   BILITOT 0.4 01/08/2018 1202   GFRNONAA 73 01/08/2018 1202   GFRNONAA 87 04/22/2013 1156   GFRAA 85 01/08/2018 1202   GFRAA >89 04/22/2013 1156   Lab Results  Component Value Date   CHOL 168 01/08/2018   HDL 49 01/08/2018  [place] 105 (H) 01/08/2018   TRIG 70 01/08/2018   CHOLHDL 3.4 01/08/2018   Lab Results  Component Value Date   HGBA1C 5.9 (H) 10/13/2015   Lab Results  Component Value Date   HXTAVWPV94 801 10/13/2015   Lab Results    Component Value Date   TSH 4.190 01/08/2018      ASSESSMENT AND PLAN 84 y.o. year old male  has a past medical history of Anal fissure, Atrial fibrillation ([place]), BPH (benign prostatic hypertrophy), CAD (coronary artery disease), Cataract, Depression (2004), Diverticulosis, GERD (gastroesophageal reflux disease), History of TIAs, Hyperlipidemia, Hypertension, Hypertensive heart disease without CHF, Hypothyroidism, Internal hemorrhoids, Lumbar disc disease, Oral bleeding (08/20/2012), Ruptured disk (1995), Shingles, Thyroid disease, Vitamin D deficiency, and Vitreous detachment. here with:  1.  Peripheral neuropathy  I discussed potential medication with the patient such as gabapentin.  I did advise that it would not be beneficial for numbness.  The patient at this time does not feel that this medication would offer him much benefit.  He plans to follow-up with Dr. Laurance Flatten and his cardiologist.  I have advised that if his symptoms worsen or he develops new symptoms he should let us know.  He will follow-up in 6 months or sooner if needed.  I spent 15 minutes with the patient. 50% of this time was spent discussing medication gabapentin     Ward Givens, MSN, NP-C 01/13/2018, 1:58 PM [facility] [address], [place], [place] [address] [phone]

## 2018-01-13 NOTE — Progress Notes (Signed)
Please discuss this patient with me during the day tomorrow.

## 2018-01-14 ENCOUNTER — Telehealth: Payer: Self-pay | Admitting: *Deleted

## 2018-01-14 NOTE — Telephone Encounter (Signed)
-----   Message from Chipper Herb, MD sent at 01/14/2018  4:25 PM EST ----- Please call patient and let his neurosurgeon know that after talking with the cardiologist that it is okay to leave the Coumadin off for 5 days. ----- Message ----- From: Zannie Cove, LPN Sent: 05/03/3357   4:10 PM To: Chipper Herb, MD  It is fine to interrupt for a procedure. At his age issue is quality of life.   Kerry Hough MD [facility]  ----- Message ----- From: Zannie Cove, LPN Sent: 2/51/8984  11:01 AM To: Jacolyn Reedy, MD  Hi Dr Wynonia Lawman! Just checking to see what you thought about this pt.   ----- Message ----- From: Zannie Cove, LPN Sent: 01/26/3127  11:30 AM To: Jacolyn Reedy, MD, Chipper Herb, MD  Hi Dr Wynonia Lawman,  Dr Redge Gainer is the PCP for this pt. He was in our office this morning for a regular check up. He mentioned that he wanted DR Maryjean Ka to do a injection in his back - due to the fact that he is not a [place] for surgery. They will only do the injections if we agree for him to hold coumadin for 5 days. Dr Laurance Flatten wants your opinion on this. (See moore last office note and MRI in Epic if you would like). Thank you for your time.  Reginia Forts

## 2018-01-14 NOTE — Telephone Encounter (Signed)
Pt aware  - he will contact neurosurgeon

## 2018-01-23 ENCOUNTER — Encounter: Payer: Self-pay | Admitting: *Deleted

## 2018-01-23 ENCOUNTER — Ambulatory Visit (INDEPENDENT_AMBULATORY_CARE_PROVIDER_SITE_OTHER): Payer: Medicare Other | Admitting: *Deleted

## 2018-01-23 VITALS — BP 148/77 | HR 52 | Ht 70.5 in | Wt 183.0 lb

## 2018-01-23 DIAGNOSIS — Z0000 Encounter for general adult medical examination without abnormal findings: Secondary | ICD-10-CM

## 2018-01-23 MED ORDER — MIRTAZAPINE 15 MG PO TABS: ORAL_TABLET | ORAL | 3 refills | Status: DC

## 2018-01-23 NOTE — Patient Instructions (Signed)
  Dalton Vaughan , Thank you for taking time to come for your Medicare Wellness Visit. I appreciate your ongoing commitment to your health goals. Please review the following plan we discussed and let me know if I can assist you in the future.   These are the goals we discussed: Continue to stay active and move carefully to avoid falls.   This is a list of the screening recommended for you and due dates:  Health Maintenance  Topic Date Due  . Tetanus Vaccine  07/08/2018*  . Flu Shot  Completed  . Pneumonia vaccines  Completed  *Topic was postponed. The date shown is not the original due date.   Consider updating your tetanus shot. You may be able to have it at the [place] for free.

## 2018-01-23 NOTE — Progress Notes (Addendum)
Subjective:   Dalton Vaughan is a 84 y.o. male who presents for an Initial Medicare Annual Wellness Visit. Dalton Vaughan is married and lives in a one story home with his wife. They have one adult son. 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   Review of Systems  Health is about the same as last year.   Cardiac Risk Factors include: advanced age (>14mn, >>9women);hypertension;male gender;sedentary lifestyle  Musculoskeletal: Back pain and multiple join pains    Urinary: gets up twice during the night to urinate. Has a clear path that is well lit Objective:    Today's Vitals   01/23/18 0839  BP: (!) 148/77  Pulse: (!) 52  Weight: 183 lb (83 kg)  Height: 5' 10.5" (1.791 m)   Body mass index is 25.89 kg/m.  Advanced Directives 01/23/2018 05/10/2016 09/01/2015 07/31/2012  Does Patient Have a Medical Advance Directive? Yes Yes Yes Patient has advance directive, copy not in chart  Type of Advance Directive Living will;Healthcare Power of [place]ld[place] will Living will  Does patient want to make changes to medical advance directive? No - Patient declined - No - Patient declined -  Copy of [place]? Yes - No - copy requested Copy requested from family  Pre-existing out of facility DNR order (yellow form or pink MOST form) - - - No    Current Medications (verified) Outpatient Encounter Medications as of 01/23/2018  Medication Sig  . amLODipine (NORVASC) 5 MG tablet TAKE ONE-HALF (1/2) TABLET TWICE A DAY (Patient taking differently: 1/2 tablet in the morning and 1 tablet in the evening)  . Calcium Carb-Cholecalciferol (CALCIUM + D3) 600-200 MG-UNIT TABS Take 0.25 tablets by mouth daily.  . Cholecalciferol (VITAMIN D-3) 5000 UNITS TABS Take 1 tablet by mouth daily.   .Marland Kitchendoxazosin (CARDURA) 8 MG tablet Take 1 tablet (8 mg total) at bedtime by mouth.  .  levothyroxine (SYNTHROID, LEVOTHROID) 50 MCG tablet Take 1 tablet (50 mcg total) by mouth daily.  . mirtazapine (REMERON) 15 MG tablet TAKE 1 TABLET DAILY AT BEDTIME AS DIRECTED  . Multiple Vitamin (MULTIVITAMIN) tablet Take 1 tablet by mouth daily.    . ramipril (ALTACE) 10 MG capsule TAKE 1 CAPSULE DAILY  . rosuvastatin (CRESTOR) 5 MG tablet TAKE 1 TABLET DAILY AS DIRECTED  . tobramycin-dexamethasone (TOBRADEX) ophthalmic ointment Apply to the left eye at bedtime  . triamterene-hydrochlorothiazide (MAXZIDE-25) 37.5-25 MG tablet Take 1 tablet by mouth daily.  . vitamin C (ASCORBIC ACID) 500 MG tablet Take 500 mg by mouth daily. Take 1/4 tablet by mouth daily.  .Marland Kitchenwarfarin (COUMADIN) 5 MG tablet TAKE AS DIRECTED   No facility-administered encounter medications on file as of 01/23/2018.     Allergies (verified) Paxil [paroxetine hcl]; Eliquis [apixaban]; Penicillins; Pravachol [pravastatin sodium]; Zetia [ezetimibe]; and Vytorin [ezetimibe-simvastatin]   History: Past Medical History:  Diagnosis Date  . Anal fissure   . Atrial fibrillation ([place]   . BPH (benign prostatic hypertrophy)   . CAD (coronary artery disease)    Dr. TWynonia Lawman . Cataract   . Depression 2004  . Diverticulosis   . GERD (gastroesophageal reflux disease)   . History of TIAs   . Hyperlipidemia   . Hypertension   . Hypertensive heart disease without CHF   . Hypothyroidism   . Internal hemorrhoids   . Lumbar disc  disease   . Oral bleeding 08/20/2012   Following molar tooth extraction July, 2013  . Ruptured disk 1995  . Shingles   . Thyroid disease   . Vitamin D deficiency   . Vitreous detachment    Past Surgical History:  Procedure Laterality Date  . bilateral cataract surgery  6/07  . CORONARY ARTERY BYPASS GRAFT  12/99    Dr. Servando Snare   . cysloscopy  8/08  . EYE SURGERY    . HERNIA REPAIR  1997   right  . herninated disc  1995  . PROSTATE BIOPSY  8/08  . TONSILLECTOMY AND ADENOIDECTOMY     Family  History  Problem Relation Age of Onset  . Cancer Brother        prostate  . Asthma Sister   . Heart disease Paternal Aunt   . Heart disease Paternal Uncle   . Hypertension Maternal Grandmother   . CVA Maternal Grandmother   . CVA Paternal Grandmother   . Colon cancer Neg Hx   . Colon polyps Neg Hx   . Kidney disease Neg Hx   . Esophageal cancer Neg Hx   . Gallbladder disease Neg Hx   . Diabetes Neg Hx    Social History   Socioeconomic History  . Marital status: Married    Spouse name: Dalton Vaughan   . Number of children: 1  . Years of education: 43  . Highest education level: Not on file  Social Needs  . Financial resource strain: Not hard at all  . Food insecurity - worry: Never true  . Food insecurity - inability: Never true  . Transportation needs - medical: No  . Transportation needs - non-medical: No  Occupational History  . Occupation: Retired    Comment: Social research officer, government x 27 years  . 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 Smoking status: Former Smoker    Types: Cigarettes    Start date: 12/17/1948    Last attempt to quit: 12/17/1966    Years since quitting: 51.1  . Smokeless tobacco: Never Used  Substance and Sexual Activity  . Alcohol use: Yes    Alcohol/week: 0.6 oz    Types: 1 Standard drinks or equivalent per week    Comment: Occassionally  . Drug use: No  . Sexual activity: No  Other Topics Concern  . Not on file  Social History Narrative   Married.  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, music at [employer]      Right-handed      Caffeine use: none   Tobacco Counseling No tobacco use  Clinical Intake:  Pre-visit preparation completed: No  Pain : 0-10 Pain Type: Chronic pain Pain Location: Generalized(back, knee, hip pain) Pain Onset: More than a month ago Pain Frequency: Intermittent Pain Relieving Factors: uses exercises Effect of Pain on Daily Activities: moderate but he still  does what he needs to do  Pain Relieving Factors: uses exercises  Nutritional Status: BMI of 19-24  Normal Diabetes: No  How often do you need to have someone help you when you read instructions, pamphlets, or other written materials from your doctor or pharmacy?: 1 - Never What is the last grade level you completed in school?: 12th grade  Interpreter Needed?: No  Information entered by :: Chong Sicilian, RN  Activities of Daily Living In your present state of health, do you have any difficulty performing the following activities:  01/23/2018  Hearing? N  Vision? N  Comment Cataract removal several years ago. Sees eye doctor every 2 years  Difficulty concentrating or making decisions? Y  Comment Has noticed some decrease in short term memory  Walking or climbing stairs? N  Dressing or bathing? N  Doing errands, shopping? N  Preparing Food and eating ? N  Using the Toilet? N  In the past six months, have you accidently leaked urine? N  Do you have problems with loss of bowel control? N  Managing your Medications? N  Comment patient keeps them in the original bottles but has them organized in his cabinet  Managing your Finances? N  Housekeeping or managing your Housekeeping? N  Some recent data might be hidden     Immunizations and Health Maintenance Immunization History  Administered Date(s) Administered  . Influenza, High Dose Seasonal PF 11/05/2016, 10/08/2017  . Influenza,inj,Quad PF,6+ Mos 09/15/2013, 09/22/2014, 10/04/2015  . Pneumococcal Conjugate-13 01/04/2014  . Pneumococcal Polysaccharide-23 12/17/1996, 11/15/2015   There are no preventive care reminders to display for this patient.  Patient Care Team: Chipper Herb, MD as PCP - General (Family Medicine) Jacolyn Reedy, MD as Consulting Physician (Cardiology) Deboraha Sprang, MD (Cardiology) Raynelle Bring, MD as Consulting Physician (Urology) Pyrtle, Lajuan Lines, MD as Consulting Physician  (Gastroenterology) Clent Jacks, MD as Consulting Physician (Ophthalmology)  No hospitalizations, ER visits, or surgeries this past year.      Assessment:   This is a routine wellness examination for [place].  Hearing/Vision screen No deficits noted during visit   Dietary issues and exercise activities discussed: Current Exercise Habits: Home exercise routine, Type of exercise: treadmill;Other - see comments(exercise bike), Time (Minutes): 45, Frequency (Times/Week): 5, Weekly Exercise (Minutes/Week): 225, Intensity: Mild, Exercise limited by: orthopedic condition(s)  Diet Eats 3 meals a day. Mixture of eating out and eating at home  Goals Continue to stay active for at least 150 minutes a week. Avoid falls  Depression Screen PHQ 2/9 Scores 01/23/2018 01/08/2018 11/20/2017 08/27/2017  PHQ - 2 Score 0 0 0 0    Fall Risk Fall Risk  01/23/2018 01/13/2018 01/08/2018 11/20/2017 08/27/2017  Falls in the past year? No No No No No    Is the patient's home free of loose throw rugs in walkways, pet beds, electrical cords, etc?   yes      Grab bars in the bathroom? no      Handrails on the stairs?   yes      Adequate lighting?   yes   Cognitive Function: MMSE - Mini Mental State Exam 01/23/2018 09/01/2015  Orientation to time 5 5  Orientation to Place 5 5  Registration 3 3  Attention/ Calculation 5 5  Recall 3 3  Language- name 2 objects 2 2  Language- repeat 1 1  Language- follow 3 step command 3 3  Language- read & follow direction 1 1  Write a sentence 1 1  Copy design 1 1  Total score 30 30    normal exam    Screening Tests Health Maintenance  Topic Date Due  . TETANUS/TDAP  07/08/2018 (Originally 12/18/2015)  . INFLUENZA VACCINE  Completed  . PNA vac Low Risk Adult  Completed        Plan:   Consider Tdap. May be able to receive it free at the [facility] vs using Tricare coverage at our office Move carefully to avoid falls Continue to stay active with a goal of at least 150 minutes  of moderate activity a week Keep f/u with PCP Schedule eye exam  I have personally reviewed and noted the following in the patient's chart:   . Medical and social history . Use of alcohol, tobacco or illicit drugs  . Current medications and supplements . Functional ability and status . Nutritional status . Physical activity . Advanced directives . List of other physicians . Hospitalizations, surgeries, and ER visits in previous 12 months . Vitals . Screenings to include cognitive, depression, and falls . Referrals and appointments  In addition, I have reviewed and discussed with patient certain preventive protocols, quality metrics, and best practice recommendations. A written personalized care plan for preventive services as well as general preventive health recommendations were provided to patient.     Chong Sicilian, RN   01/23/2018  I have reviewed and agree with the above AWV documentation.   Arrie Senate MD

## 2018-02-21 ENCOUNTER — Ambulatory Visit (INDEPENDENT_AMBULATORY_CARE_PROVIDER_SITE_OTHER): Payer: Medicare Other | Admitting: Pharmacist Clinician (PhC)/ Clinical Pharmacy Specialist

## 2018-02-21 DIAGNOSIS — I482 Chronic atrial fibrillation, unspecified: Secondary | ICD-10-CM

## 2018-02-21 DIAGNOSIS — Z7901 Long term (current) use of anticoagulants: Secondary | ICD-10-CM

## 2018-02-21 LAB — COAGUCHEK XS/INR WAIVED
INR: 1.9 — ABNORMAL HIGH (ref 0.9–1.1)
Prothrombin Time: 22.4 s

## 2018-02-21 NOTE — Patient Instructions (Signed)
Description   Continue regular schedule  INR today 1.9

## 2018-02-28 ENCOUNTER — Other Ambulatory Visit: Payer: Self-pay | Admitting: Family Medicine

## 2018-03-12 ENCOUNTER — Other Ambulatory Visit: Payer: Self-pay | Admitting: Family Medicine

## 2018-03-20 ENCOUNTER — Encounter: Payer: Self-pay | Admitting: Family Medicine

## 2018-03-20 ENCOUNTER — Ambulatory Visit (INDEPENDENT_AMBULATORY_CARE_PROVIDER_SITE_OTHER): Payer: Medicare Other | Admitting: Family Medicine

## 2018-03-20 VITALS — BP 133/85 | HR 80 | Temp 97.4°F | Ht 74.0 in | Wt 185.0 lb

## 2018-03-20 DIAGNOSIS — K120 Recurrent oral aphthae: Secondary | ICD-10-CM

## 2018-03-20 MED ORDER — MAGIC MOUTHWASH W/LIDOCAINE: ORAL | 0 refills | Status: DC

## 2018-03-20 NOTE — Patient Instructions (Signed)
There is a product called Kanka over the counter which may help dry the sores up.  I have prescribed you a mouthwash to swish and spit 4 times daily for the next week if needed.   Canker Sores Canker sores are small, painful sores that develop inside your mouth. They may also be called aphthous ulcers. You can get canker sores on the inside of your lips or cheeks, on your tongue, or anywhere inside your mouth. You can have just one canker sore or several of them. Canker sores cannot be passed from one person to another (noncontagious). These sores are different than the sores that you may get on the outside of your lips (cold sores or fever blisters). Canker sores usually start as painful red bumps. Then they turn into small white, yellow, or gray ulcers that have red borders. The ulcers may be quite painful. The pain may be worse when you eat or drink. What are the causes? The cause of this condition is not known. What increases the risk? This condition is more likely to develop in:  Women.  People in their teens or 9s.  Women who are having their menstrual period.  People who are under a lot of emotional stress.  People who do not get enough iron or B vitamins.  People who have poor oral hygiene.  People who have an injury inside the mouth. This can happen after having dental work or from chewing something hard.  What are the signs or symptoms? Along with the canker sore, symptoms may also include:  Fever.  Fatigue.  Swollen lymph nodes in your neck.  How is this diagnosed? This condition can be diagnosed based on your symptoms. Your health care provider will also examine your mouth. Your health care provider may also do tests if you get canker sores often or if they are very bad. Tests may include:  Blood tests to rule out other causes of canker sores.  Taking swabs from the sore to check for infection.  Taking a small piece of skin from the sore (biopsy) to test it for  cancer.  How is this treated? Most canker sores clear up without treatment in about 10 days. Home care is usually the only treatment that you will need. Over-the-counter medicines can relieve discomfort.If you have severe canker sores, your health care provider may prescribe:  Numbing ointment to relieve pain.  Vitamins.  Steroid medicines. These may be given as: ? Oral pills. ? Mouth rinses. ? Gels.  Antibiotic mouth rinse.  Follow these instructions at home:  Apply, take, or use medicines only as directed by your health care provider. These include vitamins.  If you were prescribed an antibiotic mouth rinse, finish all of it even if you start to feel better.  Until the sores are healed: ? Do not drink coffee or citrus juices. ? Do not eat spicy or salty foods.  Use a mild, over-the-counter mouth rinse as directed by your health care provider.  Practice good oral hygiene. ? Floss your teeth every day. ? Brush your teeth with a soft brush twice each day. Contact a health care provider if:  Your symptoms do not get better after two weeks.  You also have a fever or swollen glands.  You get canker sores often.  You have a canker sore that is getting larger.  You cannot eat or drink due to your canker sores. This information is not intended to replace advice given to you by your health  care provider. Make sure you discuss any questions you have with your health care provider. Document Released: 03/30/2011 Document Revised: 05/10/2016 Document Reviewed: 11/03/2014 Elsevier Interactive Patient Education  [employer].

## 2018-03-20 NOTE — Progress Notes (Signed)
Subjective: CC: mouth sore PCP: Chipper Herb, MD HPI:Dalton Vaughan is a 84 y.o. male presenting to clinic today for:  1. Oral sore Patient reports a 3-day history of an oral sore on the right inferior aspect of the mouth.  He notes it feels like a canker sore.  He is tried gargling mouthwash in attempt to improve and symptoms have gotten slightly better but soreness is still present.  He has been tolerating oral intake without difficulty.  Denies recent upper respiratory or GI illness.  No increased stressors that he knows of.  No known trauma to the mouth.  Denies any fevers, chills, nausea, vomiting.   ROS: Per HPI  Allergies  Allergen Reactions  . Paxil [Paroxetine Hcl] Palpitations  . Eliquis [Apixaban] Other (See Comments)    dizziness  . Penicillins     Said he is  Allergic" Natural combinations-extended release  . Pravachol [Pravastatin Sodium]   . Zetia [Ezetimibe]     myaalgias  . Vytorin [Ezetimibe-Simvastatin] Other (See Comments)   Past Medical History:  Diagnosis Date  . Anal fissure   . Atrial fibrillation ([place])   . BPH (benign prostatic hypertrophy)   . CAD (coronary artery disease)    Dr. Wynonia Lawman  . Cataract   . Depression 2004  . Diverticulosis   . GERD (gastroesophageal reflux disease)   . History of TIAs   . Hyperlipidemia   . Hypertension   . Hypertensive heart disease without CHF   . Hypothyroidism   . Internal hemorrhoids   . Lumbar disc disease   . Oral bleeding 08/20/2012   Following molar tooth extraction July, 2013  . Ruptured disk 1995  . Shingles   . Thyroid disease   . Vitamin D deficiency   . Vitreous detachment     Current Outpatient Medications:  .  amLODipine (NORVASC) 5 MG tablet, TAKE ONE-HALF (1/2) TABLET TWICE A DAY (Patient taking differently: 1/2 tablet in the morning and 1 tablet in the evening), Disp: 90 tablet, Rfl: 3 .  Calcium Carb-Cholecalciferol (CALCIUM + D3) 600-200 MG-UNIT TABS, Take 0.25 tablets by mouth daily.,  Disp: , Rfl:  .  Cholecalciferol (VITAMIN D-3) 5000 UNITS TABS, Take 1 tablet by mouth daily. , Disp: , Rfl:  .  doxazosin (CARDURA) 8 MG tablet, Take 1 tablet (8 mg total) at bedtime by mouth., Disp: 90 tablet, Rfl: 1 .  levothyroxine (SYNTHROID, LEVOTHROID) 50 MCG tablet, Take 1 tablet (50 mcg total) by mouth daily., Disp: 90 tablet, Rfl: 3 .  Multiple Vitamin (MULTIVITAMIN) tablet, Take 1 tablet by mouth daily.  , Disp: , Rfl:  .  ramipril (ALTACE) 10 MG capsule, TAKE 1 CAPSULE DAILY, Disp: 90 capsule, Rfl: 1 .  rosuvastatin (CRESTOR) 5 MG tablet, TAKE 1 TABLET DAILY AS DIRECTED, Disp: 90 tablet, Rfl: 0 .  triamterene-hydrochlorothiazide (MAXZIDE-25) 37.5-25 MG tablet, TAKE 1 TABLET DAILY, Disp: 90 tablet, Rfl: 1 .  vitamin C (ASCORBIC ACID) 500 MG tablet, Take 500 mg by mouth daily. Take 1/4 tablet by mouth daily., Disp: , Rfl:  .  warfarin (COUMADIN) 5 MG tablet, TAKE AS DIRECTED, Disp: 90 tablet, Rfl: 0 Social History   Socioeconomic History  . Marital status: Married    Spouse name: [name]   . Number of children: 1  . Years of education: 31  . Highest education level: Not on file  Occupational History  . Occupation: Retired    Comment: Social research officer, government x 27 years  . Occupation: Retired  Employer: [employer]    Comment: supervisor x 17 years  Social Needs  . Financial resource strain: Not hard at all  . Food insecurity:    Worry: Never true    Inability: Never true  . Transportation needs:    Medical: No    Non-medical: No  Tobacco Use  . Smoking status: Former Smoker    Types: Cigarettes    Start date: 12/17/1948    Last attempt to quit: 12/17/1966    Years since quitting: 51.2  . Smokeless tobacco: Never Used  Substance and Sexual Activity  . Alcohol use: Yes    Alcohol/week: 0.6 oz    Types: 1 Standard drinks or equivalent per week    Comment: Occassionally  . Drug use: No  . Sexual activity: Never  Lifestyle  . Physical activity:    Days per week: 5 days     Minutes per session: 50 min  . Stress: Only a little  Relationships  . Social connections:    Talks on phone: More than three times a week    Gets together: More than three times a week    Attends religious service: Never    Active member of club or organization: No    Attends meetings of clubs or organizations: Never    Relationship status: Married  . Intimate partner violence:    Fear of current or ex partner: No    Emotionally abused: No    Physically abused: No    Forced sexual activity: No  Other Topics Concern  . Not on file  Social History Narrative   Married.  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 Cancer Brother        prostate  . Asthma Sister   . Heart disease Paternal Aunt   . Heart disease Paternal Uncle   . Hypertension Maternal Grandmother   . CVA Maternal Grandmother   . CVA Paternal Grandmother   . Colon cancer Neg Hx   . Colon polyps Neg Hx   . Kidney disease Neg Hx   . Esophageal cancer Neg Hx   . Gallbladder disease Neg Hx   . Diabetes Neg Hx     Objective: Office vital signs reviewed. BP 133/85   Pulse 80   Temp (!) 97.4 F (36.3 C) (Oral)   Ht 6' 2"  (1.88 m)   Wt 185 lb (83.9 kg)   BMI 23.75 kg/m   Physical Examination:  General: Awake, alert, well appearing elderly male, No acute distress HEENT: Normal    Neck: No masses palpated. No lymphadenopathy    Throat: moist mucus membranes, no erythema, there is an aphthous ulcer appreciated sublingually on the right.  Assessment/ Plan: 84 y.o. male   1. Aphthous ulcer of mouth Discussed supportive care.  Can consider obtaining Kanka to apply to affected area.  We will also prescribe Magic mouthwash with lidocaine for analgesia.  Home care instructions reviewed and handout was provided.  Follow-up as needed.    Meds ordered this encounter  Medications  . magic  mouthwash w/lidocaine SOLN    Sig: Swish and spit 10 mL every 6 hours (4 times daily) as needed for oral pain.    Dispense:  240 mL    Refill:  0    60cc lidocaine : 480 MMW  [name], DO [place] [phone]

## 2018-04-18 ENCOUNTER — Ambulatory Visit (INDEPENDENT_AMBULATORY_CARE_PROVIDER_SITE_OTHER): Payer: Medicare Other | Admitting: Pharmacist Clinician (PhC)/ Clinical Pharmacy Specialist

## 2018-04-18 DIAGNOSIS — Z7901 Long term (current) use of anticoagulants: Secondary | ICD-10-CM

## 2018-04-18 DIAGNOSIS — I482 Chronic atrial fibrillation, unspecified: Secondary | ICD-10-CM

## 2018-04-18 LAB — COAGUCHEK XS/INR WAIVED
INR: 2.2 — ABNORMAL HIGH (ref 0.9–1.1)
Prothrombin Time: 26.9 s

## 2018-04-18 NOTE — Patient Instructions (Signed)
Description   Continue regular schedule  INR today 2.2 perfect reading  Goal 2-2.5

## 2018-04-21 DIAGNOSIS — Z8673 Personal history of transient ischemic attack (TIA), and cerebral infarction without residual deficits: Secondary | ICD-10-CM | POA: Diagnosis not present

## 2018-04-21 DIAGNOSIS — I119 Hypertensive heart disease without heart failure: Secondary | ICD-10-CM | POA: Diagnosis not present

## 2018-04-21 DIAGNOSIS — I495 Sick sinus syndrome: Secondary | ICD-10-CM | POA: Diagnosis not present

## 2018-04-21 DIAGNOSIS — Z7901 Long term (current) use of anticoagulants: Secondary | ICD-10-CM | POA: Diagnosis not present

## 2018-04-21 DIAGNOSIS — E785 Hyperlipidemia, unspecified: Secondary | ICD-10-CM | POA: Diagnosis not present

## 2018-04-21 DIAGNOSIS — I2510 Atherosclerotic heart disease of native coronary artery without angina pectoris: Secondary | ICD-10-CM | POA: Diagnosis not present

## 2018-04-21 DIAGNOSIS — Z951 Presence of aortocoronary bypass graft: Secondary | ICD-10-CM | POA: Diagnosis not present

## 2018-04-21 DIAGNOSIS — I482 Chronic atrial fibrillation: Secondary | ICD-10-CM | POA: Diagnosis not present

## 2018-05-15 ENCOUNTER — Ambulatory Visit: Payer: Medicare Other | Admitting: Family Medicine

## 2018-05-19 ENCOUNTER — Ambulatory Visit (INDEPENDENT_AMBULATORY_CARE_PROVIDER_SITE_OTHER): Payer: Medicare Other | Admitting: Family Medicine

## 2018-05-19 ENCOUNTER — Encounter: Payer: Self-pay | Admitting: Family Medicine

## 2018-05-19 VITALS — BP 145/70 | HR 56 | Temp 96.8°F | Ht 74.0 in | Wt 182.0 lb

## 2018-05-19 DIAGNOSIS — E559 Vitamin D deficiency, unspecified: Secondary | ICD-10-CM | POA: Diagnosis not present

## 2018-05-19 DIAGNOSIS — E7800 Pure hypercholesterolemia, unspecified: Secondary | ICD-10-CM | POA: Diagnosis not present

## 2018-05-19 DIAGNOSIS — E039 Hypothyroidism, unspecified: Secondary | ICD-10-CM | POA: Diagnosis not present

## 2018-05-19 DIAGNOSIS — Z7901 Long term (current) use of anticoagulants: Secondary | ICD-10-CM

## 2018-05-19 DIAGNOSIS — M4726 Other spondylosis with radiculopathy, lumbar region: Secondary | ICD-10-CM

## 2018-05-19 DIAGNOSIS — I482 Chronic atrial fibrillation, unspecified: Secondary | ICD-10-CM

## 2018-05-19 DIAGNOSIS — D696 Thrombocytopenia, unspecified: Secondary | ICD-10-CM

## 2018-05-19 DIAGNOSIS — I700 Atherosclerosis of aorta: Secondary | ICD-10-CM

## 2018-05-19 MED ORDER — DOXAZOSIN MESYLATE 8 MG PO TABS: 8.0000 mg | ORAL_TABLET | Freq: Every day | ORAL | 3 refills | Status: DC

## 2018-05-19 NOTE — Patient Instructions (Addendum)
Medicare Annual Wellness Visit  [place] and the medical providers at [facility] strive to bring you the best medical care.  In doing so we not only want to address your current medical conditions and concerns but also to detect new conditions early and prevent illness, disease and health-related problems.    Medicare offers a yearly Wellness Visit which allows our clinical staff to assess your need for preventative services including immunizations, lifestyle education, counseling to decrease risk of preventable diseases and screening for fall risk and other medical concerns.    This visit is provided free of charge (no copay) for all Medicare recipients. The clinical pharmacists at [facility] have begun to conduct these Wellness Visits which will also include a thorough review of all your medications.    As you primary medical provider recommend that you make an appointment for your Annual Wellness Visit if you have not done so already this year.  You may set up this appointment before you leave today or you may call back ([phone]) and schedule an appointment.  Please make sure when you call that you mention that you are scheduling your Annual Wellness Visit with the clinical pharmacist so that the appointment may be made for the proper length of time.     Continue current medications. Continue good therapeutic lifestyle changes which include good diet and exercise. Fall precautions discussed with patient. If an FOBT was given today- please return it to our front desk. If you are over 42 years old - you may need Prevnar 31 or the adult Pneumonia vaccine.  **Flu shots are available--- please call and schedule a FLU-CLINIC appointment**  After your visit with [place] today you will receive a survey in the mail or online from [employer] regarding your care with [place]. Please take a moment to fill this out. Your feedback is very  important to [place] as you can help [place] better understand your patient needs as well as improve your experience and satisfaction. WE CARE ABOUT YOU!!!   Continue to follow-up with cardiology Take Tylenol for right knee pain until x-rays are returned When you are in the neurosurgeons office later today with your wife please discuss setting up an appointment with Dr. Maryjean Ka so that he can do some injections in your back to help with neuropathy and do as the cardiologist said that you can leave off the Coumadin for 5 days prior to the an injection in the back.

## 2018-05-19 NOTE — Progress Notes (Signed)
Subjective:    Patient ID: Dalton Vaughan, male    DOB: Oct 01, 1929, 84 y.o.   MRN: [id number]  HPI Pt here for follow up and management of chronic medical problems which includes hypothyroid and hyperlipidemia. He is taking medication regularly.  The patient today is complaining with some right knee pain after a fall yesterday.  He also has worse neuropathy and would like to discuss the Remeron further.  Her doxazosin is being refilled. He will be given an FOBT and will get lab work today.  The patient is doing well overall other than the fall on his back and buttocks when his right knee slipped.  The right knee is tender and he is using a cane for walking today.  He denies any chest pain or shortness of breath.  He does see the cardiologist about every 6 months and the last visit with him was good and another visit will be coming up soon.  He denies any trouble with his stomach including nausea vomiting diarrhea blood in the stool or black tarry bowel movements and is passing his water as usual with frequency.  He has neuropathy from his spine is getting worse.  He has not had a chance to schedule a visit with Dr. Maryjean Ka as requested by Dr. Vertell Limber to get some shots in his back.  It just so happens he is taking his wife over there today to see Dr. Maryjean Ka and he will discuss getting a visit for an injection in his back while he is there.  We did encourage him to do this.     Patient Active Problem List   Diagnosis Date Noted  . Osteoarthritis of spine with radiculopathy, lumbar region 01/08/2018  . Thyroid disease   . Aortic atherosclerosis ([place]) 05/02/2017  . Thrombocytopenia ([place]) 07/10/2016  . Hereditary and idiopathic peripheral neuropathy 10/16/2015  . Mononeuropathy 10/04/2015  . Allergic rhinitis 05/06/2014  . Depression 08/03/2013  . Sick sinus syndrome ([place]) 07/31/2012  . Hypertensive heart disease without CHF   . Chronic atrial fibrillation ([place])   . CAD (coronary artery disease)   .  History of TIAs   . Lumbar disc disease   . GERD (gastroesophageal reflux disease)   . Chronic anticoagulation   . BPH (benign prostatic hypertrophy)   . Hyperlipidemia    Outpatient Encounter Medications as of 05/19/2018  Medication Sig  . amLODipine (NORVASC) 5 MG tablet TAKE ONE-HALF (1/2) TABLET TWICE A DAY (Patient taking differently: 1/2 tablet in the morning and 1 tablet in the evening)  . Calcium Carb-Cholecalciferol (CALCIUM + D3) 600-200 MG-UNIT TABS Take 0.25 tablets by mouth daily.  . Cholecalciferol (VITAMIN D-3) 5000 UNITS TABS Take 1 tablet by mouth daily.   Marland Kitchen doxazosin (CARDURA) 8 MG tablet Take 1 tablet (8 mg total) at bedtime by mouth.  . levothyroxine (SYNTHROID, LEVOTHROID) 50 MCG tablet Take 1 tablet (50 mcg total) by mouth daily.  . Multiple Vitamin (MULTIVITAMIN) tablet Take 1 tablet by mouth daily.    . ramipril (ALTACE) 10 MG capsule TAKE 1 CAPSULE DAILY  . rosuvastatin (CRESTOR) 5 MG tablet TAKE 1 TABLET DAILY AS DIRECTED  . triamterene-hydrochlorothiazide (MAXZIDE-25) 37.5-25 MG tablet TAKE 1 TABLET DAILY  . vitamin C (ASCORBIC ACID) 500 MG tablet Take 500 mg by mouth daily. Take 1/4 tablet by mouth daily.  Marland Kitchen warfarin (COUMADIN) 5 MG tablet TAKE AS DIRECTED  . [DISCONTINUED] magic mouthwash w/lidocaine SOLN Swish and spit 10 mL every 6 hours (4 times daily) as needed  for oral pain.   No facility-administered encounter medications on file as of 05/19/2018.      Review of Systems  Constitutional: Negative.   HENT: Negative.   Eyes: Negative.   Respiratory: Negative.   Cardiovascular: Negative.   Gastrointestinal: Negative.   Endocrine: Negative.   Genitourinary: Negative.   Musculoskeletal: Positive for arthralgias (right knee pain-- fall yesterday ).  Skin: Negative.   Allergic/Immunologic: Negative.   Neurological: Positive for numbness (worse - in legs).  Hematological: Negative.   Psychiatric/Behavioral: Negative.        Objective:   Physical Exam    Constitutional: He is oriented to person, place, and time. He appears well-developed and well-nourished.  The patient is pleasant and alert  HENT:  Head: Normocephalic and atraumatic.  Right Ear: External ear normal.  Left Ear: External ear normal.  Nose: Nose normal.  Mouth/Throat: Oropharynx is clear and moist. No oropharyngeal exudate.  Eyes: Pupils are equal, round, and reactive to light. Conjunctivae and EOM are normal. Right eye exhibits no discharge. Left eye exhibits no discharge.  Neck: Normal range of motion. Neck supple. No thyromegaly present.  No bruits thyromegaly or anterior cervical adenopathy  Cardiovascular: Normal rate and normal heart sounds.  No murmur heard. Heart is irregular irregular at 60/min  Pulmonary/Chest: Effort normal and breath sounds normal. He has no wheezes. He has no rales. He exhibits no tenderness.  Clear anteriorly and posteriorly no axillary adenopathy or chest wall tenderness  Abdominal: Soft. Bowel sounds are normal. He exhibits no mass. There is no tenderness.  No abdominal tenderness masses or organ enlargement or bruits  Musculoskeletal: Normal range of motion. He exhibits edema and tenderness.  Tenderness at bilateral joint lines of right knee  Lymphadenopathy:    He has no cervical adenopathy.  Neurological: He is alert and oriented to person, place, and time. He has normal reflexes. No cranial nerve deficit.  Skin: Skin is warm and dry. No rash noted.  Psychiatric: He has a normal mood and affect. His behavior is normal. Judgment and thought content normal.  Normal mood behavior and affect  Nursing note and vitals reviewed.   BP (!) 145/70 (BP Location: Left Arm)   Pulse (!) 56   Temp (!) 96.8 F (36 C) (Oral)   Ht 6' 2"  (1.88 m)   Wt 182 lb (82.6 kg)   BMI 23.37 kg/m        Assessment & Plan:  1. Hypothyroidism, unspecified type -Continue current treatment pending results of lab work - CBC with Differential/Platelet;  Future - Thyroid Panel With TSH; Future  2. Pure hypercholesterolemia -Continue aggressive therapeutic lifestyle changes - CBC with Differential/Platelet; Future - Lipid panel; Future - Hepatic function panel; Future  3. Chronic atrial fibrillation (HCC) -Continue follow-up with cardiology - CBC with Differential/Platelet; Future - BMP8+EGFR; Future  4. Thrombocytopenia (HCC) -No bruising or increased bleeding noted today through history. - CBC with Differential/Platelet; Future - BMP8+EGFR; Future  5. Vitamin D deficiency -Continue current treatment pending results of lab work - CBC with Differential/Platelet; Future - VITAMIN D 25 Hydroxy (Vit-D Deficiency, Fractures); Future  6. Aortic atherosclerosis ([place]) -Continue as aggressive therapeutic lifestyle changes as possible  7. Chronic anticoagulation -Continue Coumadin as directed  8. Osteoarthritis of spine with radiculopathy, lumbar region -Arrange visit with neurosurgeon for injections and back to help neuropathy which is getting worse.. Meds ordered this encounter  Medications  . doxazosin (CARDURA) 8 MG tablet    Sig: Take 1 tablet (8 mg  total) by mouth at bedtime.    Dispense:  90 tablet    Refill:  3   Patient Instructions                       Medicare Annual Wellness Visit  [facility] and the medical providers at [facility] strive to bring you the best medical care.  In doing so we not only want to address your current medical conditions and concerns but also to detect new conditions early and prevent illness, disease and health-related problems.    Medicare offers a yearly Wellness Visit which allows our clinical staff to assess your need for preventative services including immunizations, lifestyle education, counseling to decrease risk of preventable diseases and screening for fall risk and other medical concerns.    This visit is provided free of charge (no copay) for all Medicare  recipients. The clinical pharmacists at [facility] have begun to conduct these Wellness Visits which will also include a thorough review of all your medications.    As you primary medical provider recommend that you make an appointment for your Annual Wellness Visit if you have not done so already this year.  You may set up this appointment before you leave today or you may call back ([phone]) and schedule an appointment.  Please make sure when you call that you mention that you are scheduling your Annual Wellness Visit with the clinical pharmacist so that the appointment may be made for the proper length of time.     Continue current medications. Continue good therapeutic lifestyle changes which include good diet and exercise. Fall precautions discussed with patient. If an FOBT was given today- please return it to our front desk. If you are over 2 years old - you may need Prevnar 38 or the adult Pneumonia vaccine.  **Flu shots are available--- please call and schedule a FLU-CLINIC appointment**  After your visit with [place] today you will receive a survey in the mail or online from [employer] regarding your care with [place]. Please take a moment to fill this out. Your feedback is very important to [place] as you can help [place] better understand your patient needs as well as improve your experience and satisfaction. WE CARE ABOUT YOU!!!   Continue to follow-up with cardiology Take Tylenol for right knee pain until x-rays are returned When you are in the neurosurgeons office later today with your wife please discuss setting up an appointment with Dr. Maryjean Ka so that he can do some injections in your back to help with neuropathy and do as the cardiologist said that you can leave off the Coumadin for 5 days prior to the an injection in the back.  Arrie Senate MD

## 2018-05-21 ENCOUNTER — Other Ambulatory Visit: Payer: Medicare Other

## 2018-05-21 ENCOUNTER — Other Ambulatory Visit: Payer: Self-pay | Admitting: *Deleted

## 2018-05-21 DIAGNOSIS — I482 Chronic atrial fibrillation, unspecified: Secondary | ICD-10-CM

## 2018-05-21 DIAGNOSIS — D696 Thrombocytopenia, unspecified: Secondary | ICD-10-CM | POA: Diagnosis not present

## 2018-05-21 DIAGNOSIS — I700 Atherosclerosis of aorta: Secondary | ICD-10-CM

## 2018-05-21 DIAGNOSIS — E039 Hypothyroidism, unspecified: Secondary | ICD-10-CM | POA: Diagnosis not present

## 2018-05-21 DIAGNOSIS — E559 Vitamin D deficiency, unspecified: Secondary | ICD-10-CM | POA: Diagnosis not present

## 2018-05-21 DIAGNOSIS — E7800 Pure hypercholesterolemia, unspecified: Secondary | ICD-10-CM

## 2018-05-22 LAB — LIPID PANEL
Chol/HDL Ratio: 3.2 ratio (ref 0.0–5.0)
Cholesterol, Total: 157 mg/dL (ref 100–199)
HDL: 49 mg/dL (ref >39)
LDL Calculated: 94 mg/dL (ref 0–99)
Triglycerides: 72 mg/dL (ref 0–149)
VLDL Cholesterol Cal: 14 mg/dL (ref 5–40)

## 2018-05-22 LAB — CBC WITH DIFFERENTIAL/PLATELET
Basophils Absolute: 0 10*3/uL (ref 0.0–0.2)
Basos: 0 %
EOS (ABSOLUTE): 0.2 10*3/uL (ref 0.0–0.4)
Eos: 3 %
Hematocrit: 41.2 % (ref 37.5–51.0)
Hemoglobin: 13.3 g/dL (ref 13.0–17.7)
Immature Grans (Abs): 0 10*3/uL (ref 0.0–0.1)
Immature Granulocytes: 0 %
Lymphocytes Absolute: 2.7 10*3/uL (ref 0.7–3.1)
Lymphs: 42 %
MCH: 29.8 pg (ref 26.6–33.0)
MCHC: 32.3 g/dL (ref 31.5–35.7)
MCV: 92 fL (ref 79–97)
Monocytes Absolute: 0.5 10*3/uL (ref 0.1–0.9)
Monocytes: 8 %
Neutrophils Absolute: 3 10*3/uL (ref 1.4–7.0)
Neutrophils: 47 %
Platelets: 152 10*3/uL (ref 150–450)
RBC: 4.47 x10E6/uL (ref 4.14–5.80)
RDW: 15.2 % (ref 12.3–15.4)
WBC: 6.4 10*3/uL (ref 3.4–10.8)

## 2018-05-22 LAB — HEPATIC FUNCTION PANEL
ALT: 12 IU/L (ref 0–44)
AST: 16 IU/L (ref 0–40)
Albumin: 4.1 g/dL (ref 3.5–4.7)
Alkaline Phosphatase: 92 IU/L (ref 39–117)
Bilirubin Total: 0.6 mg/dL (ref 0.0–1.2)
Bilirubin, Direct: 0.17 mg/dL (ref 0.00–0.40)
Total Protein: 6.5 g/dL (ref 6.0–8.5)

## 2018-05-22 LAB — VITAMIN D 25 HYDROXY (VIT D DEFICIENCY, FRACTURES): Vit D, 25-Hydroxy: 75.5 ng/mL (ref 30.0–100.0)

## 2018-05-22 LAB — BMP8+EGFR
BUN/Creatinine Ratio: 12 (ref 10–24)
BUN: 11 mg/dL (ref 8–27)
CO2: 28 mmol/L (ref 20–29)
Calcium: 9.4 mg/dL (ref 8.6–10.2)
Chloride: 105 mmol/L (ref 96–106)
Creatinine, Ser: 0.89 mg/dL (ref 0.76–1.27)
GFR calc Af Amer: 89 mL/min/{1.73_m2} (ref >59)
GFR calc non Af Amer: 77 mL/min/{1.73_m2} (ref >59)
Glucose: 93 mg/dL (ref 65–99)
Potassium: 3.7 mmol/L (ref 3.5–5.2)
Sodium: 145 mmol/L — ABNORMAL HIGH (ref 134–144)

## 2018-05-22 LAB — THYROID PANEL WITH TSH
Free Thyroxine Index: 1.8 (ref 1.2–4.9)
T3 Uptake Ratio: 28 % (ref 24–39)
T4, Total: 6.3 ug/dL (ref 4.5–12.0)
TSH: 4.44 u[IU]/mL (ref 0.450–4.500)

## 2018-05-30 ENCOUNTER — Other Ambulatory Visit: Payer: Medicare Other

## 2018-05-30 DIAGNOSIS — Z1211 Encounter for screening for malignant neoplasm of colon: Secondary | ICD-10-CM | POA: Diagnosis not present

## 2018-05-31 LAB — FECAL OCCULT BLOOD, IMMUNOCHEMICAL: Fecal Occult Bld: NEGATIVE

## 2018-06-06 ENCOUNTER — Ambulatory Visit (INDEPENDENT_AMBULATORY_CARE_PROVIDER_SITE_OTHER): Payer: Medicare Other | Admitting: Pharmacist Clinician (PhC)/ Clinical Pharmacy Specialist

## 2018-06-06 DIAGNOSIS — I482 Chronic atrial fibrillation, unspecified: Secondary | ICD-10-CM

## 2018-06-06 DIAGNOSIS — Z7901 Long term (current) use of anticoagulants: Secondary | ICD-10-CM | POA: Diagnosis not present

## 2018-06-06 LAB — COAGUCHEK XS/INR WAIVED
INR: 2.4 — ABNORMAL HIGH (ref 0.9–1.1)
Prothrombin Time: 29.4 s

## 2018-06-06 NOTE — Patient Instructions (Signed)
Description   Continue regular schedule  INR today 2.4 perfect reading  Goal 2-2.5

## 2018-06-18 ENCOUNTER — Other Ambulatory Visit: Payer: Self-pay | Admitting: Family Medicine

## 2018-07-04 DIAGNOSIS — N401 Enlarged prostate with lower urinary tract symptoms: Secondary | ICD-10-CM | POA: Diagnosis not present

## 2018-07-04 DIAGNOSIS — R351 Nocturia: Secondary | ICD-10-CM | POA: Diagnosis not present

## 2018-07-04 DIAGNOSIS — R972 Elevated prostate specific antigen [PSA]: Secondary | ICD-10-CM | POA: Diagnosis not present

## 2018-07-22 DIAGNOSIS — M5416 Radiculopathy, lumbar region: Secondary | ICD-10-CM | POA: Diagnosis not present

## 2018-07-25 ENCOUNTER — Ambulatory Visit (INDEPENDENT_AMBULATORY_CARE_PROVIDER_SITE_OTHER): Payer: Medicare Other | Admitting: Pharmacist Clinician (PhC)/ Clinical Pharmacy Specialist

## 2018-07-25 ENCOUNTER — Other Ambulatory Visit: Payer: Self-pay | Admitting: Family Medicine

## 2018-07-25 DIAGNOSIS — Z7901 Long term (current) use of anticoagulants: Secondary | ICD-10-CM

## 2018-07-25 DIAGNOSIS — I482 Chronic atrial fibrillation, unspecified: Secondary | ICD-10-CM

## 2018-07-25 NOTE — Patient Instructions (Signed)
Description   Take 26m today, then Continue regular schedule  INR today 1.5 (goal 2-2.5)

## 2018-07-29 LAB — COAGUCHEK XS/INR WAIVED
INR: 1.5 — ABNORMAL HIGH (ref 0.9–1.1)
Prothrombin Time: 17.5 s

## 2018-08-20 ENCOUNTER — Ambulatory Visit: Payer: Medicare Other | Admitting: Pharmacist Clinician (PhC)/ Clinical Pharmacy Specialist

## 2018-08-20 DIAGNOSIS — Z7901 Long term (current) use of anticoagulants: Secondary | ICD-10-CM | POA: Diagnosis not present

## 2018-08-20 DIAGNOSIS — I482 Chronic atrial fibrillation, unspecified: Secondary | ICD-10-CM

## 2018-08-20 LAB — COAGUCHEK XS/INR WAIVED
INR: 2 — ABNORMAL HIGH (ref 0.9–1.1)
Prothrombin Time: 24.3 s

## 2018-08-20 MED ORDER — LEVOTHYROXINE SODIUM 50 MCG PO TABS: 50.0000 ug | ORAL_TABLET | Freq: Every day | ORAL | 3 refills | Status: DC

## 2018-08-20 MED ORDER — TRIAMTERENE-HCTZ 37.5-25 MG PO TABS: 1.0000 | ORAL_TABLET | Freq: Every day | ORAL | 3 refills | Status: DC

## 2018-08-20 NOTE — Patient Instructions (Signed)
Description   Continue current schedule  INR today 2.0 (goal 2-2.5)

## 2018-08-27 DIAGNOSIS — M4126 Other idiopathic scoliosis, lumbar region: Secondary | ICD-10-CM | POA: Diagnosis not present

## 2018-08-27 DIAGNOSIS — I10 Essential (primary) hypertension: Secondary | ICD-10-CM | POA: Diagnosis not present

## 2018-08-27 DIAGNOSIS — M5416 Radiculopathy, lumbar region: Secondary | ICD-10-CM | POA: Diagnosis not present

## 2018-08-27 DIAGNOSIS — M545 Low back pain: Secondary | ICD-10-CM | POA: Diagnosis not present

## 2018-08-27 DIAGNOSIS — G629 Polyneuropathy, unspecified: Secondary | ICD-10-CM | POA: Diagnosis not present

## 2018-09-24 ENCOUNTER — Encounter: Payer: Self-pay | Admitting: Family Medicine

## 2018-09-24 ENCOUNTER — Ambulatory Visit (INDEPENDENT_AMBULATORY_CARE_PROVIDER_SITE_OTHER): Payer: Medicare Other | Admitting: Family Medicine

## 2018-09-24 VITALS — BP 140/73 | HR 43 | Temp 97.0°F | Ht 74.0 in | Wt 181.0 lb

## 2018-09-24 DIAGNOSIS — E039 Hypothyroidism, unspecified: Secondary | ICD-10-CM | POA: Diagnosis not present

## 2018-09-24 DIAGNOSIS — I482 Chronic atrial fibrillation, unspecified: Secondary | ICD-10-CM | POA: Diagnosis not present

## 2018-09-24 DIAGNOSIS — I700 Atherosclerosis of aorta: Secondary | ICD-10-CM

## 2018-09-24 DIAGNOSIS — M4726 Other spondylosis with radiculopathy, lumbar region: Secondary | ICD-10-CM | POA: Diagnosis not present

## 2018-09-24 DIAGNOSIS — Z23 Encounter for immunization: Secondary | ICD-10-CM | POA: Diagnosis not present

## 2018-09-24 DIAGNOSIS — E7800 Pure hypercholesterolemia, unspecified: Secondary | ICD-10-CM

## 2018-09-24 DIAGNOSIS — Z7901 Long term (current) use of anticoagulants: Secondary | ICD-10-CM

## 2018-09-24 DIAGNOSIS — D696 Thrombocytopenia, unspecified: Secondary | ICD-10-CM

## 2018-09-24 DIAGNOSIS — R5383 Other fatigue: Secondary | ICD-10-CM | POA: Diagnosis not present

## 2018-09-24 DIAGNOSIS — I4891 Unspecified atrial fibrillation: Secondary | ICD-10-CM

## 2018-09-24 DIAGNOSIS — E559 Vitamin D deficiency, unspecified: Secondary | ICD-10-CM

## 2018-09-24 LAB — COAGUCHEK XS/INR WAIVED
INR: 2.1 — ABNORMAL HIGH (ref 0.9–1.1)
Prothrombin Time: 25.3 s

## 2018-09-24 MED ORDER — WARFARIN SODIUM 5 MG PO TABS: ORAL_TABLET | ORAL | 3 refills | Status: DC

## 2018-09-24 MED ORDER — AMLODIPINE BESYLATE 5 MG PO TABS: ORAL_TABLET | ORAL | 3 refills | Status: DC

## 2018-09-24 NOTE — Patient Instructions (Signed)
Adjust and continue Coumadin as directed Keep follow-up appointment with cardiology as planned and discuss cardiac clearance for possible surgery Keep follow-up appointment with neurosurgery as planned for another injection. We can reduce the strength of the Remeron pill to 7.5 and this way you would only have to take a half of one episode of a fourth of a 15 mg pill. Drink plenty of fluids and stay well-hydrated Make every effort not to put yourself at risk for falling

## 2018-09-24 NOTE — Addendum Note (Signed)
Addended by: Zannie Cove on: 09/24/2018 12:29 PM   Modules accepted: Orders

## 2018-09-24 NOTE — Progress Notes (Signed)
Subjective:    Patient ID: Dalton Vaughan, male    DOB: June 20, 1929, 84 y.o.   MRN: [id number]  HPI Pt here for follow up and management of chronic medical problems which includes hyperlipidemia, a fib and hypothyroid. He is taking medication regularly.  The patient is having ongoing issues with his back as well as right hip pain and fatigue.  He is taking Remeron 3.75.  He is due to get lab work today.  He is requesting refills on his amlodipine and Coumadin.  He plans to get the flu shot today.  He has had his pro time in the INR is good at the low therapeutic range which is what we like for him and he will continue with the current treatment regimen.  Patient has a history of diverticulosis atrial fibrillation coronary artery disease hypertension TIAs and lumbar disc disease with BPH.  The MRI from May 2018 was reviewed and the patient has a lot of disc disease affecting the L4 V nerve roots as well as some higher up in the L3 and 4 level.  In addition to this he has scoliosis.  She indicates when he tried to stop his Remeron that he did not rest as well and he had increasing numbness and aches and pains that he did not have while he was taking it.  He went back on the Remeron is currently taking a fourth of a 50 mg pill which is 3.75 nightly.  We told him that taking the least amount he can take would be the best and we will see if we can give this in a lower strength pill so maybe he can take a half one a day instead of having to cut the 15 and 4 different pieces.  Patient today denies any chest pain pressure tightness or shortness of breath.  He does have a visit scheduled with Dr. Wynonia Lawman his cardiologist next week.  He will discuss with him the possibility of getting a cardiac clearance for the potential upcoming surgery by Dr. Vertell Limber on his back.  He denies any trouble with his stomach including nausea vomiting diarrhea blood in the stool or black tarry bowel movements.  He does have urinary frequency but  no burning.   Patient Active Problem List   Diagnosis Date Noted  . Osteoarthritis of spine with radiculopathy, lumbar region 01/08/2018  . Thyroid disease   . Aortic atherosclerosis ([place]) 05/02/2017  . Thrombocytopenia ([place]) 07/10/2016  . Hereditary and idiopathic peripheral neuropathy 10/16/2015  . Mononeuropathy 10/04/2015  . Allergic rhinitis 05/06/2014  . Depression 08/03/2013  . Sick sinus syndrome ([place]) 07/31/2012  . Hypertensive heart disease without CHF   . Chronic atrial fibrillation ([place])   . CAD (coronary artery disease)   . History of TIAs   . Lumbar disc disease   . GERD (gastroesophageal reflux disease)   . Chronic anticoagulation   . BPH (benign prostatic hypertrophy)   . Hyperlipidemia    Outpatient Encounter Medications as of 09/24/2018  Medication Sig  . amLODipine (NORVASC) 5 MG tablet TAKE ONE-HALF (1/2) TABLET TWICE A DAY (Patient taking differently: 1/2 tablet in the morning and 1 tablet in the evening)  . Calcium Carb-Cholecalciferol (CALCIUM + D3) 600-200 MG-UNIT TABS Take 0.25 tablets by mouth daily.  . Cholecalciferol (VITAMIN D-3) 5000 UNITS TABS Take 1 tablet by mouth daily.   Marland Kitchen doxazosin (CARDURA) 8 MG tablet Take 1 tablet (8 mg total) by mouth at bedtime.  Marland Kitchen levothyroxine (SYNTHROID, LEVOTHROID) 50 MCG  tablet Take 1 tablet (50 mcg total) by mouth daily.  . mirtazapine (REMERON) 7.5 MG tablet Take 3.75 mg by mouth at bedtime.  . Multiple Vitamin (MULTIVITAMIN) tablet Take 1 tablet by mouth daily.    . ramipril (ALTACE) 10 MG capsule TAKE 1 CAPSULE DAILY  . rosuvastatin (CRESTOR) 5 MG tablet TAKE 1 TABLET DAILY AS DIRECTED  . triamterene-hydrochlorothiazide (MAXZIDE-25) 37.5-25 MG tablet Take 1 tablet by mouth daily.  . vitamin C (ASCORBIC ACID) 500 MG tablet Take 500 mg by mouth daily. Take 1/4 tablet by mouth daily.  Marland Kitchen warfarin (COUMADIN) 5 MG tablet TAKE AS DIRECTED   No facility-administered encounter medications on file as of 09/24/2018.        Review of Systems  Constitutional: Negative.   HENT: Negative.   Eyes: Negative.   Respiratory: Negative.   Cardiovascular: Negative.   Gastrointestinal: Negative.   Endocrine: Negative.   Genitourinary: Negative.   Musculoskeletal: Positive for arthralgias (some right hip pain ) and back pain (recent injection/ dr Vertell Limber appt - another injection next month ).  Skin: Negative.   Allergic/Immunologic: Negative.   Neurological: Negative.   Hematological: Negative.   Psychiatric/Behavioral: Negative.        Objective:   Physical Exam  Constitutional: He is oriented to person, place, and time. He appears well-developed and well-nourished. No distress.  The patient is alert and hopefully trying to make every effort to get better with his back and leg pain.  He is scheduled for 1 more injection and is hoping this will work and if it does not he is contemplating surgery with neurosurgery on his back.  HENT:  Head: Normocephalic and atraumatic.  Right Ear: External ear normal.  Left Ear: External ear normal.  Nose: Nose normal.  Mouth/Throat: Oropharynx is clear and moist. No oropharyngeal exudate.  Eyes: Pupils are equal, round, and reactive to light. Conjunctivae and EOM are normal. Right eye exhibits no discharge. Left eye exhibits no discharge. No scleral icterus.  Neck: Normal range of motion. Neck supple. No thyromegaly present.  Cardiovascular: Normal rate, normal heart sounds and intact distal pulses.  No murmur heard. The heart is irregular irregular at 84/min  Pulmonary/Chest: Effort normal and breath sounds normal. He has no wheezes. He has no rales. He exhibits no tenderness.  Clear anteriorly and posteriorly no axillary adenopathy  Abdominal: Soft. Bowel sounds are normal. He exhibits no mass. There is no tenderness. There is no rebound and no guarding.  No abdominal tenderness masses organ enlargement or bruits  Musculoskeletal: Normal range of motion. He exhibits  no edema or tenderness.  Has increased pain in both hips with leg raising  Lymphadenopathy:    He has no cervical adenopathy.  Neurological: He is alert and oriented to person, place, and time. He has normal reflexes. No cranial nerve deficit.  Reflexes are asymmetric and lower extremity and about 1+ or less.  Skin: Skin is warm and dry. No rash noted.  Psychiatric: He has a normal mood and affect. His behavior is normal. Judgment and thought content normal.  The patient's mood affect and behavior are all normal for this patient.  He is laid back and calm.  Nursing note and vitals reviewed.   BP 140/73 (BP Location: Left Arm)   Pulse (!) 43   Temp (!) 97 F (36.1 C) (Oral)   Ht _0  (1.88 m)   Wt 181 lb (82.1 kg)   BMI 23.24 kg/m  Assessment & Plan:  1. Chronic anticoagulation -The patient remains in atrial fibrillation at about 84/min. - BMP8+EGFR - Hepatic function panel  2. Chronic atrial fibrillation - BMP8+EGFR - CoaguChek XS/INR Waived  3. Pure hypercholesterolemia -Continue Crestor pending results of lab work - Lipid panel - Hepatic function panel  4. Aortic atherosclerosis (HCC) -Continue Crestor and as aggressive therapeutic lifestyle changes as possible - Lipid panel  5. Hypothyroidism, unspecified type -Continue with thyroid replacement and check B12 level because of fatigue pending results of lab work  6. Thrombocytopenia ([place]) -Patient has no bleeding issues but does have a bruise on his arm from a bump into something. - CBC with Differential/Platelet  7. Vitamin D deficiency -Continue vitamin D replacement pending results of lab work - VITAMIN D 25 Hydroxy (Vit-D Deficiency, Fractures)  8. Fatigue, unspecified type -Check thyroid and B12 level - Thyroid Panel With TSH  9. Encounter for immunization - Flu vaccine HIGH DOSE PF  10. Osteoarthritis of spine with radiculopathy, lumbar region -Continue to follow-up with neurosurgery  11.  Atrial fibrillation, unspecified type [facility]) -Follow-up with cardiology as planned  Meds ordered this encounter  Medications  . warfarin (COUMADIN) 5 MG tablet    Sig: TAKE AS DIRECTED    Dispense:  90 tablet    Refill:  3  . amLODipine (NORVASC) 5 MG tablet    Sig: TAKE ONE-HALF (1/2) TABLET TWICE A DAY    Dispense:  90 tablet    Refill:  3   Patient Instructions  Adjust and continue Coumadin as directed Keep follow-up appointment with cardiology as planned and discuss cardiac clearance for possible surgery Keep follow-up appointment with neurosurgery as planned for another injection. We can reduce the strength of the Remeron pill to 7.5 and this way you would only have to take a half of one episode of a fourth of a 15 mg pill. Drink plenty of fluids and stay well-hydrated Make every effort not to put yourself at risk for falling  Arrie Senate MD

## 2018-09-25 LAB — HEPATIC FUNCTION PANEL
ALT: 11 IU/L (ref 0–44)
AST: 21 IU/L (ref 0–40)
Albumin: 4.2 g/dL (ref 3.5–4.7)
Alkaline Phosphatase: 80 IU/L (ref 39–117)
Bilirubin Total: 0.5 mg/dL (ref 0.0–1.2)
Bilirubin, Direct: 0.16 mg/dL (ref 0.00–0.40)
Total Protein: 6.3 g/dL (ref 6.0–8.5)

## 2018-09-25 LAB — CBC WITH DIFFERENTIAL/PLATELET
Basophils Absolute: 0 10*3/uL (ref 0.0–0.2)
Basos: 0 %
EOS (ABSOLUTE): 0.1 10*3/uL (ref 0.0–0.4)
Eos: 2 %
Hematocrit: 38.9 % (ref 37.5–51.0)
Hemoglobin: 12.6 g/dL — ABNORMAL LOW (ref 13.0–17.7)
Immature Grans (Abs): 0 10*3/uL (ref 0.0–0.1)
Immature Granulocytes: 0 %
Lymphocytes Absolute: 2.3 10*3/uL (ref 0.7–3.1)
Lymphs: 33 %
MCH: 30.2 pg (ref 26.6–33.0)
MCHC: 32.4 g/dL (ref 31.5–35.7)
MCV: 93 fL (ref 79–97)
Monocytes Absolute: 0.4 10*3/uL (ref 0.1–0.9)
Monocytes: 6 %
Neutrophils Absolute: 4 10*3/uL (ref 1.4–7.0)
Neutrophils: 59 %
Platelets: 129 10*3/uL — ABNORMAL LOW (ref 150–450)
RBC: 4.17 x10E6/uL (ref 4.14–5.80)
RDW: 13.9 % (ref 12.3–15.4)
WBC: 6.8 10*3/uL (ref 3.4–10.8)

## 2018-09-25 LAB — VITAMIN D 25 HYDROXY (VIT D DEFICIENCY, FRACTURES): Vit D, 25-Hydroxy: 80.6 ng/mL (ref 30.0–100.0)

## 2018-09-25 LAB — LIPID PANEL
Chol/HDL Ratio: 3.5 ratio (ref 0.0–5.0)
Cholesterol, Total: 156 mg/dL (ref 100–199)
HDL: 45 mg/dL (ref >39)
LDL Calculated: 95 mg/dL (ref 0–99)
Triglycerides: 78 mg/dL (ref 0–149)
VLDL Cholesterol Cal: 16 mg/dL (ref 5–40)

## 2018-09-25 LAB — BMP8+EGFR
BUN/Creatinine Ratio: 13 (ref 10–24)
BUN: 12 mg/dL (ref 8–27)
CO2: 26 mmol/L (ref 20–29)
Calcium: 9.2 mg/dL (ref 8.6–10.2)
Chloride: 103 mmol/L (ref 96–106)
Creatinine, Ser: 0.94 mg/dL (ref 0.76–1.27)
GFR calc Af Amer: 84 mL/min/{1.73_m2} (ref >59)
GFR calc non Af Amer: 73 mL/min/{1.73_m2} (ref >59)
Glucose: 83 mg/dL (ref 65–99)
Potassium: 3.9 mmol/L (ref 3.5–5.2)
Sodium: 145 mmol/L — ABNORMAL HIGH (ref 134–144)

## 2018-10-01 LAB — THYROID PANEL WITH TSH
Free Thyroxine Index: 2.4 (ref 1.2–4.9)
T3 Uptake Ratio: 31 % (ref 24–39)
T4, Total: 7.8 ug/dL (ref 4.5–12.0)
TSH: 2.32 u[IU]/mL (ref 0.450–4.500)

## 2018-10-01 LAB — VITAMIN B12: Vitamin B-12: 682 pg/mL (ref 232–1245)

## 2018-10-01 LAB — SPECIMEN STATUS REPORT

## 2018-10-13 ENCOUNTER — Encounter: Payer: Self-pay | Admitting: Family Medicine

## 2018-10-13 ENCOUNTER — Ambulatory Visit (INDEPENDENT_AMBULATORY_CARE_PROVIDER_SITE_OTHER): Payer: Medicare Other | Admitting: Family Medicine

## 2018-10-13 VITALS — BP 142/86 | HR 70 | Temp 97.2°F | Ht 74.0 in | Wt 185.0 lb

## 2018-10-13 DIAGNOSIS — I482 Chronic atrial fibrillation, unspecified: Secondary | ICD-10-CM | POA: Diagnosis not present

## 2018-10-13 DIAGNOSIS — R58 Hemorrhage, not elsewhere classified: Secondary | ICD-10-CM

## 2018-10-13 DIAGNOSIS — Z7901 Long term (current) use of anticoagulants: Secondary | ICD-10-CM | POA: Diagnosis not present

## 2018-10-13 LAB — COAGUCHEK XS/INR WAIVED
INR: 2.3 — ABNORMAL HIGH (ref 0.9–1.1)
Prothrombin Time: 27.1 s

## 2018-10-13 LAB — CBC WITH DIFFERENTIAL/PLATELET
Basophils Absolute: 0 10*3/uL (ref 0.0–0.2)
Basos: 0 %
EOS (ABSOLUTE): 0.1 10*3/uL (ref 0.0–0.4)
Eos: 2 %
Hematocrit: 39 % (ref 37.5–51.0)
Hemoglobin: 13 g/dL (ref 13.0–17.7)
Immature Grans (Abs): 0 10*3/uL (ref 0.0–0.1)
Immature Granulocytes: 0 %
Lymphocytes Absolute: 1.8 10*3/uL (ref 0.7–3.1)
Lymphs: 30 %
MCH: 30.2 pg (ref 26.6–33.0)
MCHC: 33.3 g/dL (ref 31.5–35.7)
MCV: 91 fL (ref 79–97)
Monocytes Absolute: 0.3 10*3/uL (ref 0.1–0.9)
Monocytes: 5 %
Neutrophils Absolute: 3.7 10*3/uL (ref 1.4–7.0)
Neutrophils: 63 %
Platelets: 135 10*3/uL — ABNORMAL LOW (ref 150–450)
RBC: 4.3 x10E6/uL (ref 4.14–5.80)
RDW: 14 % (ref 12.3–15.4)
WBC: 5.9 10*3/uL (ref 3.4–10.8)

## 2018-10-13 MED ORDER — MIRTAZAPINE 7.5 MG PO TABS: 3.7500 mg | ORAL_TABLET | Freq: Every day | ORAL | 0 refills | Status: DC

## 2018-10-13 NOTE — Progress Notes (Signed)
BP (!) 142/86   Pulse 70   Temp (!) 97.2 F (36.2 C) (Oral)   Ht 6' 2"  (1.88 m)   Wt 83.9 kg   BMI 23.75 kg/m    Subjective:    Patient ID: Dalton Vaughan, male    DOB: Apr 16, 1929, 84 y.o.   MRN: [id number]  HPI: Dalton Vaughan is a 84 y.o. male on warfarin for chronic anticoagulation presenting on 10/13/2018 for tongue bleeding that began at 4:30 am this morning. He denies any injury or trauma that he can remember. He woke up to a slowly bleeding cut on the right tip of the tongue. He attempted to stop the bleeding with paper towel and tea bags with no relief. Mild bleeding continued until 9:10am and now the bleeding has stopped. He thinks he might have had a red sore on the tongue a couple weeks ago, but thought it had cleared up. He feels a dull stinging pain at the site of the bleeding. Patient reports 2 different events of redness/bleeding in the eyes that resolved in 2-3 days. At present time the tongue is not bleeding. Patient normally takes his coumadin in the am, but did not take it this morning. He denies any headache, lightheadedness, dizziness, chest pain, or sob.   Relevant past medical, surgical, family and social history reviewed and updated as indicated. Interim medical history since our last visit reviewed. Allergies and medications reviewed and updated.  Review of Systems  Constitutional: Negative for chills, fatigue and fever.  HENT: Positive for mouth sores (tongue). Negative for nosebleeds.   Eyes: Negative for photophobia, pain, redness and visual disturbance.  Respiratory: Positive for shortness of breath.   Cardiovascular: Negative for chest pain.  Gastrointestinal: Positive for nausea.  Skin: Negative.   Neurological: Negative for dizziness, weakness, light-headedness and headaches.  Hematological: Bruises/bleeds easily.    Per HPI unless specifically indicated above      Objective:    BP (!) 142/86   Pulse 70   Temp (!) 97.2 F (36.2 C) (Oral)   Ht 6' 2"   (1.88 m)   Wt 83.9 kg   BMI 23.75 kg/m   Wt Readings from Last 3 Encounters:  10/13/18 83.9 kg  09/24/18 82.1 kg  05/19/18 82.6 kg    Physical Exam  Constitutional: He is oriented to person, place, and time. He appears well-developed and well-nourished. No distress.  HENT:  Head: Normocephalic and atraumatic.  Mouth/Throat: Oral lesions (tongue, 2cm) present.    Eyes: Pupils are equal, round, and reactive to light. Conjunctivae and EOM are normal.  Neck: No tracheal deviation present.  Cardiovascular: Normal heart sounds. An irregularly irregular rhythm present. Exam reveals no friction rub.  No murmur heard. Pulmonary/Chest: Effort normal.  Neurological: He is alert and oriented to person, place, and time.  Skin: Skin is warm and dry.      Assessment & Plan:   Problem List Items Addressed This Visit      Cardiovascular and Mediastinum   Chronic atrial fibrillation (HCC) (Chronic)   Relevant Orders   CoaguChek XS/INR Waived     Other   Chronic anticoagulation - Primary (Chronic)   Relevant Orders   CoaguChek XS/INR Waived    Other Visit Diagnoses    Bleeding       Relevant Orders   CoaguChek XS/INR Waived    Chronic anticoagulation Patient on warfarin for afib presents with mild tongue bleed since 4:30am unrelieved by pressure and tea bags.  Patient denies injury or trauma  to tongue. Bleeding stopped once patient arrived at the doctors office. Patient did not take his 2.25m warfarin this morning. He denies dizziness, lightheadedness, chest pain, or other bleeding sites. Today INR was 2.3 . We will not cauterize since bleeding has stopped. Recommend patient restart warfarin 520mand continue as prescribed. I also recommend salt water gargles daily. I will check a CBC today. Call or return if bleeding returns.   Description   Hold for today for bleeding and then resume current schedule tomorrow  INR today 2.3 (goal 2-2.5)     Follow up plan: Return if symptoms  worsen or fail to improve.  Counseling provided for all of the vaccine components Orders Placed This Encounter  Procedures  . CoaguChek XS/INR Waived   Patient seen and examined with Cadence Furth PA student, agree with assessment and plan above.  Had patient hold for today and then monitor from there, return as needed. JoCaryl PinaMD [facility] 10/13/2018, 9:30 AM

## 2018-10-20 DIAGNOSIS — M5416 Radiculopathy, lumbar region: Secondary | ICD-10-CM | POA: Diagnosis not present

## 2018-10-29 ENCOUNTER — Ambulatory Visit: Payer: Medicare Other | Admitting: Cardiology

## 2018-10-30 ENCOUNTER — Ambulatory Visit (INDEPENDENT_AMBULATORY_CARE_PROVIDER_SITE_OTHER): Payer: Medicare Other

## 2018-10-30 ENCOUNTER — Ambulatory Visit (INDEPENDENT_AMBULATORY_CARE_PROVIDER_SITE_OTHER): Payer: Medicare Other | Admitting: Pediatrics

## 2018-10-30 ENCOUNTER — Encounter: Payer: Self-pay | Admitting: Pediatrics

## 2018-10-30 VITALS — BP 134/85 | HR 90 | Temp 102.3°F | Ht 74.0 in | Wt 188.2 lb

## 2018-10-30 DIAGNOSIS — R509 Fever, unspecified: Secondary | ICD-10-CM

## 2018-10-30 DIAGNOSIS — Z7901 Long term (current) use of anticoagulants: Secondary | ICD-10-CM | POA: Diagnosis not present

## 2018-10-30 DIAGNOSIS — R6889 Other general symptoms and signs: Secondary | ICD-10-CM

## 2018-10-30 DIAGNOSIS — I482 Chronic atrial fibrillation, unspecified: Secondary | ICD-10-CM

## 2018-10-30 DIAGNOSIS — J189 Pneumonia, unspecified organism: Secondary | ICD-10-CM

## 2018-10-30 DIAGNOSIS — J111 Influenza due to unidentified influenza virus with other respiratory manifestations: Secondary | ICD-10-CM | POA: Diagnosis not present

## 2018-10-30 LAB — URINALYSIS, COMPLETE
Bilirubin, UA: NEGATIVE
Glucose, UA: NEGATIVE
Leukocytes, UA: NEGATIVE
Nitrite, UA: NEGATIVE
Protein, UA: NEGATIVE
Specific Gravity, UA: 1.015 (ref 1.005–1.030)
Urobilinogen, Ur: 1 mg/dL (ref 0.2–1.0)
pH, UA: 6.5 (ref 5.0–7.5)

## 2018-10-30 LAB — MICROSCOPIC EXAMINATION
Bacteria, UA: NONE SEEN
Epithelial Cells (non renal): NONE SEEN /hpf (ref 0–10)
Renal Epithel, UA: NONE SEEN /hpf
WBC, UA: NONE SEEN /hpf (ref 0–5)

## 2018-10-30 LAB — COAGUCHEK XS/INR WAIVED
INR: 1.6 — ABNORMAL HIGH (ref 0.9–1.1)
Prothrombin Time: 19.7 s

## 2018-10-30 LAB — VERITOR FLU A/B WAIVED
Influenza A: NEGATIVE
Influenza B: NEGATIVE

## 2018-10-30 MED ORDER — LEVOFLOXACIN 500 MG PO TABS: 500.0000 mg | ORAL_TABLET | Freq: Every day | ORAL | 0 refills | Status: DC

## 2018-10-30 NOTE — Progress Notes (Signed)
  Subjective:   Patient ID: Dalton Vaughan, male    DOB: 07-29-1929, 84 y.o.   MRN: [id number] CC: Chills; Nasal Congestion; and Headache  HPI: Dalton Vaughan is a 84 y.o. male   Symptoms started 3 days ago in the evening.  Appetite was okay the first day.  Appetite been down the last couple of days.  Trying to drink fluids.  Fever bothering him the most.  Woke up last night with fever, chills, night sweats.  Not taking anything for the fever such as Tylenol or ibuprofen.  No abdominal pain, no runny nose.  Has a slight cough.  Nonproductive.  No dysuria.  He is on warfarin for chronic anticoagulation for atrial fibrillation.  Relevant past medical, surgical, family and social history reviewed. Allergies and medications reviewed and updated. Social History   Tobacco Use  Smoking Status Former Smoker  . Types: Cigarettes  . Start date: 12/17/1948  . Last attempt to quit: 12/17/1966  . Years since quitting: 51.9  Smokeless Tobacco Never Used   ROS: Per HPI   Objective:    BP 134/85   Pulse 90   Temp (!) 102.3 F (39.1 C) (Oral)   Ht 6' 2"  (1.88 m)   Wt 188 lb 3.2 oz (85.4 kg)   SpO2 93%   BMI 24.16 kg/m   Wt Readings from Last 3 Encounters:  10/30/18 188 lb 3.2 oz (85.4 kg)  10/13/18 185 lb (83.9 kg)  09/24/18 181 lb (82.1 kg)    Gen: NAD, alert, cooperative with exam, NCAT EYES: EOMI, no conjunctival injection, or no icterus ENT:  TMs pearly gray b/l, OP without erythema LYMPH: no cervical LAD CV: NRRR, normal S1/S2, no  murmur, distal pulses 2+ b/l Resp: Moving air well, no wheezes, comfortable WOB Abd: +BS, soft, NTND. no guarding or organomegaly Ext: No edema, warm Neuro: Alert and oriented MSK: normal muscle bulk  Assessment & Plan:  Kenly was seen today for chills, nasal congestion and headache.  Diagnoses and all orders for this visit:  Community acquired pneumonia, unspecified laterality -     CBC with Differential -     CMP14+EGFR -     levofloxacin (LEVAQUIN)  500 MG tablet; Take 1 tablet (500 mg total) by mouth daily.  Flu-like symptoms Flu test negative. -     Veritor Flu A/B Waived  Fever, unspecified fever cause Given decreased oxygen levels, cough will go ahead and treat for community acquired pneumonia.  Patient has been dehydrated, consistent with findings on UA.  May not be able to see opacity on x-Deondray due to dehydration. -     DG Chest 2 View; Future -     Urinalysis, Complete -     Urine Culture  Anticoagulant long-term use -     CoaguChek XS/INR Waived  Chronic anticoagulation INR today 1.6.  Goal 2-2.5 due to history of bleeding.  Dosing adjusted down to account for start of levofloxacin.  Patient going to follow-up in 4 days for recheck INR.  Chronic atrial fibrillation   Follow up plan: Return in about 4 days (around 11/03/2018) for follow up, will need INR, recheck symptoms. Assunta Found, MD [place]

## 2018-10-31 LAB — CMP14+EGFR
ALT: 13 IU/L (ref 0–44)
AST: 26 IU/L (ref 0–40)
Albumin/Globulin Ratio: 1.9 (ref 1.2–2.2)
Albumin: 3.9 g/dL (ref 3.5–4.7)
Alkaline Phosphatase: 95 IU/L (ref 39–117)
BUN/Creatinine Ratio: 11 (ref 10–24)
BUN: 10 mg/dL (ref 8–27)
Bilirubin Total: 0.7 mg/dL (ref 0.0–1.2)
CO2: 22 mmol/L (ref 20–29)
Calcium: 8.7 mg/dL (ref 8.6–10.2)
Chloride: 100 mmol/L (ref 96–106)
Creatinine, Ser: 0.95 mg/dL (ref 0.76–1.27)
GFR calc Af Amer: 83 mL/min/{1.73_m2} (ref >59)
GFR calc non Af Amer: 72 mL/min/{1.73_m2} (ref >59)
Globulin, Total: 2.1 g/dL (ref 1.5–4.5)
Glucose: 96 mg/dL (ref 65–99)
Potassium: 3.8 mmol/L (ref 3.5–5.2)
Sodium: 140 mmol/L (ref 134–144)
Total Protein: 6 g/dL (ref 6.0–8.5)

## 2018-10-31 LAB — CBC WITH DIFFERENTIAL/PLATELET
Basophils Absolute: 0 10*3/uL (ref 0.0–0.2)
Basos: 0 %
EOS (ABSOLUTE): 0 10*3/uL (ref 0.0–0.4)
Eos: 0 %
Hematocrit: 36.9 % — ABNORMAL LOW (ref 37.5–51.0)
Hemoglobin: 12.4 g/dL — ABNORMAL LOW (ref 13.0–17.7)
Immature Grans (Abs): 0 10*3/uL (ref 0.0–0.1)
Immature Granulocytes: 0 %
Lymphocytes Absolute: 1.5 10*3/uL (ref 0.7–3.1)
Lymphs: 21 %
MCH: 29.6 pg (ref 26.6–33.0)
MCHC: 33.6 g/dL (ref 31.5–35.7)
MCV: 88 fL (ref 79–97)
Monocytes Absolute: 0.9 10*3/uL (ref 0.1–0.9)
Monocytes: 12 %
Neutrophils Absolute: 4.8 10*3/uL (ref 1.4–7.0)
Neutrophils: 67 %
Platelets: 130 10*3/uL — ABNORMAL LOW (ref 150–450)
RBC: 4.19 x10E6/uL (ref 4.14–5.80)
RDW: 14.3 % (ref 12.3–15.4)
WBC: 7.3 10*3/uL (ref 3.4–10.8)

## 2018-10-31 LAB — URINE CULTURE: Organism ID, Bacteria: NO GROWTH

## 2018-11-03 ENCOUNTER — Ambulatory Visit (INDEPENDENT_AMBULATORY_CARE_PROVIDER_SITE_OTHER): Payer: Medicare Other | Admitting: Pediatrics

## 2018-11-03 ENCOUNTER — Encounter: Payer: Self-pay | Admitting: Pediatrics

## 2018-11-03 ENCOUNTER — Telehealth: Payer: Self-pay | Admitting: Family Medicine

## 2018-11-03 VITALS — BP 116/66 | HR 65 | Temp 97.8°F | Ht 74.0 in | Wt 183.0 lb

## 2018-11-03 DIAGNOSIS — Z7901 Long term (current) use of anticoagulants: Secondary | ICD-10-CM | POA: Diagnosis not present

## 2018-11-03 DIAGNOSIS — I25119 Atherosclerotic heart disease of native coronary artery with unspecified angina pectoris: Secondary | ICD-10-CM | POA: Diagnosis not present

## 2018-11-03 DIAGNOSIS — J189 Pneumonia, unspecified organism: Secondary | ICD-10-CM

## 2018-11-03 DIAGNOSIS — I482 Chronic atrial fibrillation, unspecified: Secondary | ICD-10-CM | POA: Diagnosis not present

## 2018-11-03 LAB — COAGUCHEK XS/INR WAIVED
INR: 1.7 — ABNORMAL HIGH (ref 0.9–1.1)
Prothrombin Time: 19.9 s

## 2018-11-03 NOTE — Progress Notes (Signed)
  Subjective:   Patient ID: Dalton Vaughan, male    DOB: 06/05/1929, 84 y.o.   MRN: [id number] CC: chronic anticoagulation  HPI: Dalton Vaughan is a 84 y.o. male   Treated 5 days ago with levofloxacin for community acquired pneumonia.  Symptoms have improved over the weekend.  No fevers in last 2 days.  Here for repeat INR.  Warfarin dosing was decreased at start of antibiotic.  On anticoagulation for chronic atrial fibrillation.  Relevant past medical, surgical, family and social history reviewed. Allergies and medications reviewed and updated. Social History   Tobacco Use  Smoking Status Former Smoker  . Types: Cigarettes  . Start date: 12/17/1948  . Last attempt to quit: 12/17/1966  . Years since quitting: 51.9  Smokeless Tobacco Never Used   ROS: Per HPI   Objective:    BP 116/66   Pulse 65   Temp 97.8 F (36.6 C) (Oral)   Ht 6' 2"  (1.88 m)   Wt 183 lb (83 kg)   SpO2 97% Comment: Sitting with room air  BMI 23.50 kg/m   Wt Readings from Last 3 Encounters:  11/03/18 183 lb (83 kg)  10/30/18 188 lb 3.2 oz (85.4 kg)  10/13/18 185 lb (83.9 kg)    Gen: NAD, alert, cooperative with exam, NCAT EYES: EOMI, no conjunctival injection, or no icterus ENT:  TMs pearly gray b/l, OP without erythema LYMPH: no cervical LAD CV: NRRR, normal S1/S2, no murmur, distal pulses 2+ b/l Resp: CTABL, no wheezes, normal WOB Abd: +BS, soft, NTND. no guarding or organomegaly Ext: No edema, warm Neuro: Alert and oriented, strength equal b/l UE and LE, coordination grossly normal MSK: normal muscle bulk  Assessment & Plan:  Dalton Vaughan was seen today for chronic anticoagulation.  Diagnoses and all orders for this visit:  Chronic anticoagulation -     CoaguChek XS/INR Waived  Chronic atrial fibrillation INR slightly below goal, 1.7.  Dosing schedule given.  We will follow-up with pharmacist for next INR within 1 week.  Dalton Vaughan has appointment scheduled.  Community acquired pneumonia, unspecified  laterality Symptoms improved, continue levofloxacin.  Follow-up with PCP for repeat chest x-Dalton Vaughan, confirm resolution.  Follow up plan: Return in about 4 weeks (around 12/01/2018) for pleural effusion follow up. Assunta Found, MD [place]

## 2018-11-03 NOTE — Telephone Encounter (Signed)
Pt notified of appt

## 2018-11-05 ENCOUNTER — Encounter: Payer: Medicare Other | Admitting: Pharmacist Clinician (PhC)/ Clinical Pharmacy Specialist

## 2018-11-07 ENCOUNTER — Encounter: Payer: Self-pay | Admitting: Cardiology

## 2018-11-07 ENCOUNTER — Ambulatory Visit (INDEPENDENT_AMBULATORY_CARE_PROVIDER_SITE_OTHER): Payer: Medicare Other | Admitting: Cardiology

## 2018-11-07 VITALS — BP 120/70 | HR 66 | Ht 72.0 in | Wt 181.0 lb

## 2018-11-07 DIAGNOSIS — R600 Localized edema: Secondary | ICD-10-CM | POA: Diagnosis not present

## 2018-11-07 DIAGNOSIS — I495 Sick sinus syndrome: Secondary | ICD-10-CM | POA: Diagnosis not present

## 2018-11-07 DIAGNOSIS — I119 Hypertensive heart disease without heart failure: Secondary | ICD-10-CM | POA: Diagnosis not present

## 2018-11-07 DIAGNOSIS — I482 Chronic atrial fibrillation, unspecified: Secondary | ICD-10-CM

## 2018-11-07 DIAGNOSIS — I25119 Atherosclerotic heart disease of native coronary artery with unspecified angina pectoris: Secondary | ICD-10-CM | POA: Diagnosis not present

## 2018-11-07 DIAGNOSIS — E7800 Pure hypercholesterolemia, unspecified: Secondary | ICD-10-CM

## 2018-11-07 DIAGNOSIS — Z7901 Long term (current) use of anticoagulants: Secondary | ICD-10-CM | POA: Diagnosis not present

## 2018-11-07 NOTE — Patient Instructions (Signed)
Medication Instructions:  Your physician has recommended you make the following change in your medication:  STOP amlodipine (norvasc)   If you need a refill on your cardiac medications before your next appointment, please call your pharmacy.   Lab work: Your physician recommends that you return for lab work today: BMP, ProBNP.   If you have labs (blood work) drawn today and your tests are completely normal, you will receive your results only by: Marland Kitchen MyChart Message (if you have MyChart) OR . A paper copy in the mail If you have any lab test that is abnormal or we need to change your treatment, we will call you to review the results.  Testing/Procedures: You had an EKG today.   Your physician has requested that you have an echocardiogram. Echocardiography is a painless test that uses sound waves to create images of your heart. It provides your doctor with information about the size and shape of your heart and how well your heart's chambers and valves are working. This procedure takes approximately one hour. There are no restrictions for this procedure.   Follow-Up: At [facility], you and your health needs are our priority.  As part of our continuing mission to provide you with exceptional heart care, we have created designated Provider Care Teams.  These Care Teams include your primary Cardiologist (physician) and Advanced Practice Providers (APPs -  Physician Assistants and Nurse Practitioners) who all work together to provide you with the care you need, when you need it. You will need a follow up appointment in 4 weeks.  Please call our office 2 months in advance to schedule this appointment.  You may see No primary care provider on file. or another member of our [employer] in [place]: Jenne Campus, MD . Jyl Heinz, MD  Any Other Special Instructions Will Be Listed Below (If Applicable).  **Please check your home blood pressure at the same time each day.  Call our office if the top number of your blood pressure is consistently above 140.     Echocardiogram An echocardiogram, or echocardiography, uses sound waves (ultrasound) to produce an image of your heart. The echocardiogram is simple, painless, obtained within a short period of time, and offers valuable information to your health care provider. The images from an echocardiogram can provide information such as:  Evidence of coronary artery disease (CAD).  Heart size.  Heart muscle function.  Heart valve function.  Aneurysm detection.  Evidence of a past heart attack.  Fluid buildup around the heart.  Heart muscle thickening.  Assess heart valve function.  Tell a health care provider about:  Any allergies you have.  All medicines you are taking, including vitamins, herbs, eye drops, creams, and over-the-counter medicines.  Any problems you or family members have had with anesthetic medicines.  Any blood disorders you have.  Any surgeries you have had.  Any medical conditions you have.  Whether you are pregnant or may be pregnant. What happens before the procedure? No special preparation is needed. Eat and drink normally. What happens during the procedure?  In order to produce an image of your heart, gel will be applied to your chest and a wand-like tool (transducer) will be moved over your chest. The gel will help transmit the sound waves from the transducer. The sound waves will harmlessly bounce off your heart to allow the heart images to be captured in real-time motion. These images will then be recorded.  You may need an IV  to receive a medicine that improves the quality of the pictures. What happens after the procedure? You may return to your normal schedule including diet, activities, and medicines, unless your health care provider tells you otherwise. This information is not intended to replace advice given to you by your health care provider. Make sure you  discuss any questions you have with your health care provider. Document Released: 11/30/2000 Document Revised: 07/21/2016 Document Reviewed: 08/10/2013 Elsevier Interactive Patient Education  2017 [employer].

## 2018-11-07 NOTE — Progress Notes (Signed)
Cardiology Office Note:    Date:  11/07/2018   ID:  Dalton Vaughan, DOB August 19, 1929, MRN [id number]  PCP:  Chipper Herb, MD  Cardiologist:  Shirlee More, MD    Referring MD: Chipper Herb, MD    ASSESSMENT:    1. Coronary artery disease involving native coronary artery of native heart with angina pectoris ([place])   2. Chronic atrial fibrillation (HCC)   3. Chronic anticoagulation   4. Hypertensive heart disease without CHF   5. Pure hypercholesterolemia   6. Sick sinus syndrome (HCC)    PLAN:    In order of problems listed above:  1. CAD is stable he has had no anginal discomfort New York Heart Association class I and would be appropriate for neurosurgery and does not require preoperative evaluation with stress testing 2. Stable rate controlled continue his anticoagulant he is not on suppressive therapy 3. Warfarin managed in Coumadin clinic he need to be withdrawn prior to surgery and hold reinstitution until safe from a bleeding perspective 4. Stable hypertension stop calcium channel blocker concern regarding heart failure check proBNP and echocardiogram 5. Stable continue his statin recent labs show an LDL of 95 total cholesterol 156 at target 6. Stable rate controlled atrial fibrillation without suppressant treatment   Next appointment: 4 weeks   Medication Adjustments/Labs and Tests Ordered: Current medicines are reviewed at length with the patient today.  Concerns regarding medicines are outlined above.  No orders of the defined types were placed in this encounter.  No orders of the defined types were placed in this encounter.   No chief complaint on file.   History of Present Illness:    Dalton Vaughan is a 84 y.o. male with a hx of coronary artery disease, chronic atrial fibrillation anticoagulated previous coronary artery bypass surgery sick sinus syndrome hypertensive heart disease and hyperlipidemia last seen 04/21/18.Marland Kitchen Compliance with diet, lifestyle and  medications: Recently had pneumonia and since then he has not felt well.  He continues to cough but no sputum is had some mild shortness of breath a marked increase in peripheral edema diffuse weakness is only stopped taking his calcium channel blocker.  Seen by me in the office today and I question whether he is in heart failure with marked edema and for further evaluation we will check a proBNP and a gram to see back in the office in 4 weeks prior to his anticipated no surgery.  He said no chest pain palpitation or syncope. Past Medical History:  Diagnosis Date  . Anal fissure   . Atrial fibrillation ([place])   . BPH (benign prostatic hypertrophy)   . CAD (coronary artery disease)    Dr. Wynonia Lawman  . Cataract   . Depression 2004  . Diverticulosis   . GERD (gastroesophageal reflux disease)   . History of TIAs   . Hyperlipidemia   . Hypertension   . Hypertensive heart disease without CHF   . Hypothyroidism   . Internal hemorrhoids   . Lumbar disc disease   . Oral bleeding 08/20/2012   Following molar tooth extraction July, 2013  . Ruptured disk 1995  . Shingles   . Thyroid disease   . Vitamin D deficiency   . Vitreous detachment     Past Surgical History:  Procedure Laterality Date  . bilateral cataract surgery  6/07  . CARDIAC CATHETERIZATION    . CORONARY ARTERY BYPASS GRAFT  12/99    Dr. Servando Snare   . cysloscopy  8/08  . EYE  SURGERY    . HERNIA REPAIR  1997   right  . herninated disc  1995  . PROSTATE BIOPSY  8/08  . TONSILLECTOMY AND ADENOIDECTOMY      Current Medications: Current Meds  Medication Sig  . Calcium Carb-Cholecalciferol (CALCIUM + D3) 600-200 MG-UNIT TABS Take 0.25 tablets by mouth daily.  . Cholecalciferol (VITAMIN D-3) 5000 UNITS TABS Take 1 tablet by mouth daily.   Marland Kitchen doxazosin (CARDURA) 8 MG tablet Take 1 tablet (8 mg total) by mouth at bedtime.  Marland Kitchen levothyroxine (SYNTHROID, LEVOTHROID) 50 MCG tablet Take 1 tablet (50 mcg total) by mouth daily.  .  mirtazapine (REMERON) 7.5 MG tablet Take 0.5 tablets (3.75 mg total) by mouth at bedtime.  . Multiple Vitamin (MULTIVITAMIN) tablet Take 1 tablet by mouth daily.    . ramipril (ALTACE) 10 MG capsule TAKE 1 CAPSULE DAILY  . rosuvastatin (CRESTOR) 5 MG tablet TAKE 1 TABLET DAILY AS DIRECTED (Patient taking differently: Takes on Mon, Wed, and Fri)  . triamterene-hydrochlorothiazide (MAXZIDE-25) 37.5-25 MG tablet Take 1 tablet by mouth daily.  . vitamin C (ASCORBIC ACID) 500 MG tablet Take 500 mg by mouth daily. Take 1/4 tablet by mouth daily.  Marland Kitchen warfarin (COUMADIN) 5 MG tablet TAKE AS DIRECTED  . [DISCONTINUED] amLODipine (NORVASC) 5 MG tablet TAKE ONE-HALF (1/2) TABLET TWICE A DAY     Allergies:   Paxil [paroxetine hcl]; Eliquis [apixaban]; Penicillins; Pravachol [pravastatin sodium]; Zetia [ezetimibe]; and Vytorin [ezetimibe-simvastatin]   Social History   Socioeconomic History  . Marital status: Married    Spouse name: [name]   . Number of children: 1  . Years of education: 36  . Highest education level: Not on file  Occupational History  . Occupation: Retired    Comment: Social research officer, government x 27 years  . 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 Financial resource strain: Not hard at all  . Food insecurity:    Worry: Never true    Inability: Never true  . Transportation needs:    Medical: No    Non-medical: No  Tobacco Use  . Smoking status: Former Smoker    Types: Cigarettes    Start date: 12/17/1948    Last attempt to quit: 12/17/1966    Years since quitting: 51.9  . Smokeless tobacco: Never Used  Substance and Sexual Activity  . Alcohol use: Yes    Alcohol/week: 1.0 standard drinks    Types: 1 Standard drinks or equivalent per week    Comment: Occassionally  . Drug use: No  . Sexual activity: Never  Lifestyle  . Physical activity:    Days per week: 5 days    Minutes per session: 50 min  . Stress: Only a little    Relationships  . Social connections:    Talks on phone: More than three times a week    Gets together: More than three times a week    Attends religious service: Never    Active member of club or organization: No    Attends meetings of clubs or organizations: Never    Relationship status: Married  Other Topics Concern  . Not on file  Social History Narrative   Married.  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 There is no history of Colon cancer, Colon polyps, Kidney disease, Esophageal cancer, Gallbladder disease, or Diabetes. ROS:   Please see the history of present illness.    All other systems reviewed and are negative.  EKGs/Labs/Other Studies Reviewed:    The following studies were reviewed today:  EKG:  EKG ordered today.  The ekg ordered today demonstrates atrial fibrillation controlled ventricular rate  Recent Labs: 09/24/2018: TSH 2.320 10/30/2018: ALT 13; BUN 10; Creatinine, Ser 0.95; Hemoglobin 12.4; Platelets 130; Potassium 3.8; Sodium 140  Recent Lipid Panel    Component Value Date/Time   CHOL 156 09/24/2018 1131   TRIG 78 09/24/2018 1131   TRIG 78 07/09/2016 0813   HDL 45 09/24/2018 1131   HDL 46 07/09/2016 0813   CHOLHDL 3.5 09/24/2018 1131   CHOLHDL 3.4 04/22/2013 1156   VLDL 16 04/22/2013 1156   LDLCALC 95 09/24/2018 1131   LDLCALC 109 (H) 09/07/2014 0803    Physical Exam:    VS:  BP 120/70 (BP Location: Right Arm, Patient Position: Sitting, Cuff Size: Normal)   Pulse 66   Ht 6' (1.829 m)   Wt 181 lb (82.1 kg)   SpO2 95%   BMI 24.55 kg/m     Wt Readings from Last 3 Encounters:  11/07/18 181 lb (82.1 kg)  11/03/18 183  lb (83 kg)  10/30/18 188 lb 3.2 oz (85.4 kg)     GEN: He looks frail today well nourished, well developed in no acute distress HEENT: Normal NECK: No JVD; No carotid bruits LYMPHATICS: No lymphadenopathy CARDIAC: Irregular rhythm variable first heart sound  RESPIRATORY:  Clear to auscultation without rales, wheezing or rhonchi  ABDOMEN: Soft, non-tender, non-distended MUSCULOSKELETAL: 4+ doughy edema to the knees bilaterally looks typical for the edema associated both calcium channel blocker and alpha-blocker edema; No deformity  SKIN: Warm and dry NEUROLOGIC:  Alert and oriented x 3 PSYCHIATRIC:  Normal affect    Signed, Shirlee More, MD  11/07/2018 4:04 PM    [facility]

## 2018-11-08 LAB — BASIC METABOLIC PANEL
BUN/Creatinine Ratio: 13 (ref 10–24)
BUN: 12 mg/dL (ref 8–27)
CO2: 27 mmol/L (ref 20–29)
Calcium: 9 mg/dL (ref 8.6–10.2)
Chloride: 99 mmol/L (ref 96–106)
Creatinine, Ser: 0.92 mg/dL (ref 0.76–1.27)
GFR calc Af Amer: 86 mL/min/{1.73_m2} (ref >59)
GFR calc non Af Amer: 75 mL/min/{1.73_m2} (ref >59)
Glucose: 92 mg/dL (ref 65–99)
Potassium: 4.3 mmol/L (ref 3.5–5.2)
Sodium: 141 mmol/L (ref 134–144)

## 2018-11-08 LAB — PRO B NATRIURETIC PEPTIDE: NT-Pro BNP: 2681 pg/mL — ABNORMAL HIGH (ref 0–486)

## 2018-11-10 ENCOUNTER — Telehealth: Payer: Self-pay | Admitting: *Deleted

## 2018-11-10 NOTE — Telephone Encounter (Signed)
-----   Message from Richardo Priest, MD sent at 11/09/2018  9:41 AM EST ----- Elevated BNP and SOB  Start furosemide 20 mg a day fu in 4 weeks

## 2018-11-10 NOTE — Telephone Encounter (Signed)
Left message for patient to return call regarding lab results.

## 2018-11-11 ENCOUNTER — Encounter: Payer: Self-pay | Admitting: Nurse Practitioner

## 2018-11-11 ENCOUNTER — Other Ambulatory Visit: Payer: Self-pay

## 2018-11-11 ENCOUNTER — Ambulatory Visit (INDEPENDENT_AMBULATORY_CARE_PROVIDER_SITE_OTHER): Payer: Medicare Other | Admitting: Nurse Practitioner

## 2018-11-11 VITALS — BP 142/86 | HR 87 | Temp 96.8°F | Ht 72.0 in | Wt 182.0 lb

## 2018-11-11 DIAGNOSIS — I25119 Atherosclerotic heart disease of native coronary artery with unspecified angina pectoris: Secondary | ICD-10-CM | POA: Diagnosis not present

## 2018-11-11 DIAGNOSIS — L03119 Cellulitis of unspecified part of limb: Secondary | ICD-10-CM

## 2018-11-11 MED ORDER — CIPROFLOXACIN HCL 500 MG PO TABS: 500.0000 mg | ORAL_TABLET | Freq: Two times a day (BID) | ORAL | 0 refills | Status: DC

## 2018-11-11 MED ORDER — FUROSEMIDE 20 MG PO TABS
20.0000 mg | ORAL_TABLET | Freq: Every day | ORAL | 3 refills | Status: DC
Start: 2018-11-11 — End: 2018-11-17

## 2018-11-11 NOTE — Patient Instructions (Signed)

## 2018-11-11 NOTE — Progress Notes (Signed)
   Subjective:    Patient ID: Dalton Vaughan, male    DOB: 21-Oct-1929, 84 y.o.   MRN: [id number]   Chief Complaint: Bilateral lower legs red   HPI Patient come sin today c/o of both lower legs red and swollen. This started last Thursday or Friday and the redness has gotten worse.  Review of Systems  Constitutional: Negative.   Respiratory: Negative.   Cardiovascular: Positive for leg swelling.  Gastrointestinal: Negative.   Genitourinary: Negative.   Skin: Positive for color change (erythematous lower ext).  Neurological: Negative.   Psychiatric/Behavioral: Negative.   All other systems reviewed and are negative.      Objective:   Physical Exam  Constitutional: He is oriented to person, place, and time. He appears well-developed and well-nourished. No distress.  Cardiovascular: Normal rate and regular rhythm.  Pulmonary/Chest: Effort normal.  Musculoskeletal: Edema: 1+bil lower ext.  Neurological: He is alert and oriented to person, place, and time.  Skin: Skin is warm.   BP (!) 142/86   Pulse 87   Temp (!) 96.8 F (36 C) (Oral)   Ht 6' (1.829 m)   Wt 182 lb (82.6 kg)   BMI 24.68 kg/m         Assessment & Plan:  Dalton Vaughan in today with chief complaint of Bilateral lower legs red   1. Cellulitis of lower extremity, unspecified laterality Elevate legs when sitting RTO on Friday to recheck INR - furosemide (LASIX) 20 MG tablet; Take 1 tablet (20 mg total) by mouth daily.  Dispense: 30 tablet; Refill: 3 - ciprofloxacin (CIPRO) 500 MG tablet; Take 1 tablet (500 mg total) by mouth 2 (two) times daily.  Dispense: 20 tablet; Refill: 0  Mary-Margaret Hassell Done, FNP

## 2018-11-14 ENCOUNTER — Encounter: Payer: Self-pay | Admitting: Nurse Practitioner

## 2018-11-14 ENCOUNTER — Ambulatory Visit (INDEPENDENT_AMBULATORY_CARE_PROVIDER_SITE_OTHER): Payer: Medicare Other | Admitting: Nurse Practitioner

## 2018-11-14 VITALS — BP 132/67 | HR 62 | Temp 96.8°F | Ht 72.0 in | Wt 182.0 lb

## 2018-11-14 DIAGNOSIS — I25119 Atherosclerotic heart disease of native coronary artery with unspecified angina pectoris: Secondary | ICD-10-CM

## 2018-11-14 DIAGNOSIS — I482 Chronic atrial fibrillation, unspecified: Secondary | ICD-10-CM

## 2018-11-14 DIAGNOSIS — L03119 Cellulitis of unspecified part of limb: Secondary | ICD-10-CM | POA: Diagnosis not present

## 2018-11-14 DIAGNOSIS — Z7901 Long term (current) use of anticoagulants: Secondary | ICD-10-CM

## 2018-11-14 LAB — COAGUCHEK XS/INR WAIVED
INR: 3 — ABNORMAL HIGH (ref 0.9–1.1)
Prothrombin Time: 36 s

## 2018-11-14 NOTE — Addendum Note (Signed)
Addended by: Rolena Infante on: 11/14/2018 12:40 PM   Modules accepted: Orders

## 2018-11-14 NOTE — Progress Notes (Signed)
   Subjective:    Patient ID: Dalton Vaughan, male    DOB: [birth year], 84 y.o.   MRN: [id number]   Chief Complaint: recheck legs and INR  HPI patient was seen 12/01/18 with cellulitis of bil lower ext. He was given cipro and lasix. Legs are some better but still red. He is on coumadin and needs INR rechecked today.   Indication: atrial fibrillation Bleeding signs/symptoms: None Thromboembolic signs/symptoms: None  Missed Coumadin doses: None Medication changes: no Dietary changes: no Bacterial/viral infection: cellulitis bil lower ext Other concerns: no     Review of Systems  Constitutional: Negative.   Eyes: Negative.   Respiratory: Negative.   Cardiovascular: Positive for leg swelling.  Genitourinary: Negative.   Skin:       Erythematous lower ext.  Neurological: Negative.   Psychiatric/Behavioral: Negative.   All other systems reviewed and are negative.      Objective:   Physical Exam  Constitutional: He is oriented to person, place, and time. He appears well-developed and well-nourished.  Cardiovascular: Normal rate and regular rhythm.  Pulmonary/Chest: Effort normal.  Neurological: He is alert and oriented to person, place, and time.  Skin:  bil lower ext are erythematous and warm to touch. No worse then previous.   BP 132/67   Pulse 62   Temp (!) 96.8 F (36 C) (Oral)   Ht 6' (1.829 m)   Wt 182 lb (82.6 kg)   BMI 24.68 kg/m         Assessment & Plan:  Dalton Vaughan in today with chief complaint of Recheck legs   1. Chronic anticoagulation See anticoag documentation  2. Chronic atrial fibrillation  3. Cellulitis of lower extremity, unspecified laterality Unna boots bil Elevate legs when sitting Follow up Monday to check unna boots - Apply unna boot   Mary-Margaret Hassell Done, FNP

## 2018-11-17 ENCOUNTER — Encounter: Payer: Self-pay | Admitting: Nurse Practitioner

## 2018-11-17 ENCOUNTER — Ambulatory Visit (INDEPENDENT_AMBULATORY_CARE_PROVIDER_SITE_OTHER): Payer: Medicare Other | Admitting: Nurse Practitioner

## 2018-11-17 VITALS — BP 172/88 | HR 85 | Temp 96.8°F | Ht 72.0 in | Wt 182.0 lb

## 2018-11-17 DIAGNOSIS — L03119 Cellulitis of unspecified part of limb: Secondary | ICD-10-CM

## 2018-11-17 DIAGNOSIS — I25119 Atherosclerotic heart disease of native coronary artery with unspecified angina pectoris: Secondary | ICD-10-CM

## 2018-11-17 MED ORDER — FUROSEMIDE 40 MG PO TABS: 40.0000 mg | ORAL_TABLET | Freq: Every day | ORAL | 3 refills | Status: DC

## 2018-11-17 NOTE — Progress Notes (Signed)
   Subjective:    Patient ID: Dalton Vaughan, male    DOB: 1929-10-24, 84 y.o.   MRN: [id number]   Chief Complaint: Recheck legs   HPI Patient was seen 11/11/18 with cellulitis bil lower ext. He was given cipro and told to take lasix daily. He returned to office on 11/14/18 with not much improvement. We told him to continue cipro as ordered and unna boots were applied.    Review of Systems  Constitutional: Negative for activity change and appetite change.  HENT: Negative.   Eyes: Negative for pain.  Respiratory: Negative for shortness of breath.   Cardiovascular: Positive for leg swelling. Negative for chest pain and palpitations.  Gastrointestinal: Negative for abdominal pain.  Endocrine: Negative for polydipsia.  Genitourinary: Negative.   Skin: Negative for rash.  Neurological: Negative for dizziness, weakness and headaches.  Hematological: Does not bruise/bleed easily.  Psychiatric/Behavioral: Negative.   All other systems reviewed and are negative.      Objective:   Physical Exam  Constitutional: He appears well-developed and well-nourished.  Cardiovascular: Normal rate and regular rhythm.  Pulmonary/Chest: Effort normal and breath sounds normal.  Musculoskeletal: He exhibits edema (1+ edema bil lower ext.).  Skin: Skin is warm and dry. Capillary refill takes less than 2 seconds.  Mild erythema bil lower ext- much improved  Psychiatric: He has a normal mood and affect. His behavior is normal. Judgment and thought content normal.    BP (!) 172/88   Pulse 85   Temp (!) 96.8 F (36 C) (Oral)   Ht 6' (1.829 m)   Wt 182 lb (82.6 kg)   BMI 24.68 kg/m        Assessment & Plan:  Dalton Vaughan in today with chief complaint of Recheck legs   1. Cellulitis of lower extremity, unspecified laterality Finish cipro Increase lasix to 64m daily elevtae legs when sitting RTO if no better - furosemide (LASIX) 40 MG tablet; Take 1 tablet (40 mg total) by mouth daily.   Dispense: 30 tablet; Refill: [place] FNP

## 2018-11-17 NOTE — Patient Instructions (Signed)
Furosemide tablets What is this medicine? FUROSEMIDE (fyoor [place] se mide) is a diuretic. It helps you make more urine and to lose salt and excess water from your body. This medicine is used to treat high blood pressure, and edema or swelling from heart, kidney, or liver disease. This medicine may be used for other purposes; ask your health care provider or pharmacist if you have questions. COMMON BRAND NAME(S): Active-Medicated Specimen Kit, Delone, Diuscreen, Lasix, RX Specimen Collection Kit, Specimen Collection Kit, URINX Medicated Specimen Collection What should I tell my health care provider before I take this medicine? They need to know if you have any of these conditions: -abnormal blood electrolytes -diarrhea or vomiting -gout -heart disease -kidney disease, small amounts of urine, or difficulty passing urine -liver disease -thyroid disease -an unusual or allergic reaction to furosemide, sulfa drugs, other medicines, foods, dyes, or preservatives -pregnant or trying to get pregnant -breast-feeding How should I use this medicine? Take this medicine by mouth with a glass of water. Follow the directions on the prescription label. You may take this medicine with or without food. If it upsets your stomach, take it with food or milk. Do not take your medicine more often than directed. Remember that you will need to pass more urine after taking this medicine. Do not take your medicine at a time of day that will cause you problems. Do not take at bedtime. Talk to your pediatrician regarding the use of this medicine in children. While this drug may be prescribed for selected conditions, precautions do apply. Overdosage: If you think you have taken too much of this medicine contact a poison control center or emergency room at once. NOTE: This medicine is only for you. Do not share this medicine with others. What if I miss a dose? If you miss a dose, take it as soon as you can. If it is almost time  for your next dose, take only that dose. Do not take double or extra doses. What may interact with this medicine? -aspirin and aspirin-like medicines -certain antibiotics -chloral hydrate -cisplatin -cyclosporine -digoxin -diuretics -laxatives -lithium -medicines for blood pressure -medicines that relax muscles for surgery -methotrexate -NSAIDs, medicines for pain and inflammation like ibuprofen, naproxen, or indomethacin -phenytoin -steroid medicines like prednisone or cortisone -sucralfate -thyroid hormones This list may not describe all possible interactions. Give your health care provider a list of all the medicines, herbs, non-prescription drugs, or dietary supplements you use. Also tell them if you smoke, drink alcohol, or use illegal drugs. Some items may interact with your medicine. What should I watch for while using this medicine? Visit your doctor or health care professional for regular checks on your progress. Check your blood pressure regularly. Ask your doctor or health care professional what your blood pressure should be, and when you should contact him or her. If you are a diabetic, check your blood sugar as directed. You may need to be on a special diet while taking this medicine. Check with your doctor. Also, ask how many glasses of fluid you need to drink a day. You must not get dehydrated. You may get drowsy or dizzy. Do not drive, use machinery, or do anything that needs mental alertness until you know how this drug affects you. Do not stand or sit up quickly, especially if you are an older patient. This reduces the risk of dizzy or fainting spells. Alcohol can make you more drowsy and dizzy. Avoid alcoholic drinks. This medicine can make you more sensitive  to the sun. Keep out of the sun. If you cannot avoid being in the sun, wear protective clothing and use sunscreen. Do not use sun lamps or tanning beds/booths. What side effects may I notice from receiving this  medicine? Side effects that you should report to your doctor or health care professional as soon as possible: -blood in urine or stools -dry mouth -fever or chills -hearing loss or ringing in the ears -irregular heartbeat -muscle pain or weakness, cramps -skin rash -stomach upset, pain, or nausea -tingling or numbness in the hands or feet -unusually weak or tired -vomiting or diarrhea -yellowing of the eyes or skin Side effects that usually do not require medical attention (report to your doctor or health care professional if they continue or are bothersome): -headache -loss of appetite -unusual bleeding or bruising This list may not describe all possible side effects. Call your doctor for medical advice about side effects. You may report side effects to FDA at 1-800-FDA-1088. Where should I keep my medicine? Keep out of the reach of children. Store at room temperature between 15 and 30 degrees C (59 and 86 degrees F). Protect from light. Throw away any unused medicine after the expiration date. NOTE: This sheet is a summary. It may not cover all possible information. If you have questions about this medicine, talk to your doctor, pharmacist, or health care provider.  2018 Elsevier/Gold Standard (2015-02-23 13:49:50)  

## 2018-11-18 ENCOUNTER — Telehealth: Payer: Self-pay | Admitting: Cardiology

## 2018-11-18 MED ORDER — HYDRALAZINE HCL 25 MG PO TABS: 12.5000 mg | ORAL_TABLET | Freq: Three times a day (TID) | ORAL | 3 refills | Status: DC

## 2018-11-18 NOTE — Telephone Encounter (Signed)
Patient advised to start hydralazine 12.5 mg three times daily per Dr. Bettina Gavia. Patient verbally understands and will contact us if things don't improve.

## 2018-11-18 NOTE — Telephone Encounter (Signed)
Start Hydralazine 12 and half milligrams 3 times daily dispense 1 month 3 refills

## 2018-11-18 NOTE — Telephone Encounter (Signed)
Was told if his BP goes up to give you a call and it's gone up

## 2018-11-18 NOTE — Telephone Encounter (Signed)
Patient reports stopping his amlodipine a little over a week ago at last office visit with Dr. Bettina Gavia. Patient was feeling fine until seeing family medicine for cellulitis on 11/14/18. They started him on antibiotics and furosemide 40 mg daily, stopped his triamterene-hctz 37.5-25 mg and his blood pressure began increasing yesterday. His readings yesterday were 148/83, and 165/79. His blood pressure today is 160/90. He is also reporting blurred vision since starting furosemide. He denies any headaches, dizziness, or weakness to one side. Will route to Dr. Bettina Gavia.

## 2018-11-18 NOTE — Addendum Note (Signed)
Addended by: Ashok Norris on: 11/18/2018 01:58 PM   Modules accepted: Orders

## 2018-11-19 ENCOUNTER — Ambulatory Visit (HOSPITAL_BASED_OUTPATIENT_CLINIC_OR_DEPARTMENT_OTHER)
Admission: RE | Admit: 2018-11-19 | Discharge: 2018-11-19 | Disposition: A | Discharge status: Home / Self Care | Payer: Medicare Other | Source: Ambulatory Visit | Attending: Cardiology | Admitting: Cardiology

## 2018-11-19 DIAGNOSIS — I482 Chronic atrial fibrillation, unspecified: Secondary | ICD-10-CM | POA: Insufficient documentation

## 2018-11-19 DIAGNOSIS — R600 Localized edema: Secondary | ICD-10-CM | POA: Diagnosis not present

## 2018-11-19 DIAGNOSIS — I119 Hypertensive heart disease without heart failure: Secondary | ICD-10-CM | POA: Diagnosis not present

## 2018-11-19 NOTE — Progress Notes (Signed)
  Echocardiogram 2D Echocardiogram has been performed.  Dalton Vaughan Dalton Vaughan 11/19/2018, 11:39 AM

## 2018-11-25 ENCOUNTER — Ambulatory Visit (INDEPENDENT_AMBULATORY_CARE_PROVIDER_SITE_OTHER): Payer: Medicare Other | Admitting: Nurse Practitioner

## 2018-11-25 ENCOUNTER — Encounter: Payer: Self-pay | Admitting: Nurse Practitioner

## 2018-11-25 VITALS — BP 151/72 | HR 68 | Temp 97.0°F | Ht 72.0 in | Wt 188.0 lb

## 2018-11-25 DIAGNOSIS — Z7901 Long term (current) use of anticoagulants: Secondary | ICD-10-CM | POA: Diagnosis not present

## 2018-11-25 DIAGNOSIS — L03119 Cellulitis of unspecified part of limb: Secondary | ICD-10-CM

## 2018-11-25 DIAGNOSIS — I25119 Atherosclerotic heart disease of native coronary artery with unspecified angina pectoris: Secondary | ICD-10-CM | POA: Diagnosis not present

## 2018-11-25 DIAGNOSIS — I482 Chronic atrial fibrillation, unspecified: Secondary | ICD-10-CM | POA: Diagnosis not present

## 2018-11-25 LAB — COAGUCHEK XS/INR WAIVED
INR: 1.1 (ref 0.9–1.1)
Prothrombin Time: 13 s

## 2018-11-25 MED ORDER — MEDICAL COMPRESSION SOCKS MISC: 1.0000 | Freq: Every day | 2 refills | Status: DC

## 2018-11-25 NOTE — Progress Notes (Signed)
   Subjective:    Patient ID: Dalton Vaughan, male    DOB: 1929/11/12, 84 y.o.   MRN: [id number]   Chief Complaint: Recheck legs and protime   HPI Patient has had issue with cellulitis of bil  lower ext for several weeks. He was last seen on 11/17/18 and unna boots were applied. He was to finish his cipro and followup today. Due to the cirpo we need to recheck his inr today.   Indication: atrial fibrillation Bleeding signs/symptoms: None Thromboembolic signs/symptoms: None  Missed Coumadin doses: None Medication changes: no Dietary changes: no Bacterial/viral infection: yes - has been on cipro for cellulitis Other concerns: no     Review of Systems  Constitutional: Negative.   Respiratory: Negative.   Cardiovascular: Negative.   Gastrointestinal: Negative.   Genitourinary: Negative.   Neurological: Negative.   Psychiatric/Behavioral: Negative.   All other systems reviewed and are negative.      Objective:   Physical Exam  Constitutional: He is oriented to person, place, and time. He appears well-developed and well-nourished. No distress.  Cardiovascular: Normal rate and regular rhythm.  Pulmonary/Chest: Effort normal and breath sounds normal.  Musculoskeletal: He exhibits edema (1+ edema bil lower etx.).  Neurological: He is alert and oriented to person, place, and time.  Skin: Skin is warm and dry.  Resolved erythema of bil lower ext.  Psychiatric: He has a normal mood and affect. His behavior is normal. Judgment and thought content normal.   BP (!) 151/72   Pulse 68   Temp (!) 97 F (36.1 C) (Oral)   Ht 6' (1.829 m)   Wt 188 lb (85.3 kg)   BMI 25.50 kg/m    Current coumadin dose is coumadin 67m daily except 2.557mon M,W,and F     Assessment & Plan:  Dalton Vaughan in today with chief complaint of Recheck legs and protime   1. Chronic atrial fibrillation (HCC) - CoaguChek XS/INR Waived  2. Chronic anticoagulation Recheck inr when sees dr. MoLaurance Flattenn dec  16,2019 See anticoag documentation for details and dosage change  3. Cellulitis of lower extremity, unspecified laterality Wear compression hose daily Elevate legs when sitting continue current dose of lasix.  Mary-Margaret MaHassell DoneFNP

## 2018-12-01 ENCOUNTER — Ambulatory Visit (INDEPENDENT_AMBULATORY_CARE_PROVIDER_SITE_OTHER): Payer: Medicare Other | Admitting: Family Medicine

## 2018-12-01 ENCOUNTER — Ambulatory Visit (INDEPENDENT_AMBULATORY_CARE_PROVIDER_SITE_OTHER): Payer: Medicare Other

## 2018-12-01 ENCOUNTER — Encounter: Payer: Self-pay | Admitting: Family Medicine

## 2018-12-01 VITALS — BP 150/80 | HR 75 | Temp 96.8°F | Ht 72.0 in | Wt 186.0 lb

## 2018-12-01 DIAGNOSIS — I25119 Atherosclerotic heart disease of native coronary artery with unspecified angina pectoris: Secondary | ICD-10-CM

## 2018-12-01 DIAGNOSIS — R609 Edema, unspecified: Secondary | ICD-10-CM

## 2018-12-01 DIAGNOSIS — I482 Chronic atrial fibrillation, unspecified: Secondary | ICD-10-CM | POA: Diagnosis not present

## 2018-12-01 DIAGNOSIS — Z7901 Long term (current) use of anticoagulants: Secondary | ICD-10-CM

## 2018-12-01 DIAGNOSIS — H6121 Impacted cerumen, right ear: Secondary | ICD-10-CM | POA: Diagnosis not present

## 2018-12-01 DIAGNOSIS — J90 Pleural effusion, not elsewhere classified: Secondary | ICD-10-CM

## 2018-12-01 DIAGNOSIS — L03119 Cellulitis of unspecified part of limb: Secondary | ICD-10-CM | POA: Diagnosis not present

## 2018-12-01 NOTE — Progress Notes (Signed)
Subjective:     Patient ID: Dalton Vaughan, male   DOB: 03-Aug-1929, 84 y.o.   MRN: [id number]  HPI Patient here today for 1 month follow up from recent edema, right lower leg wound, and pleural effusions.  The patient says that the swelling and wound are better on the right lower leg.  He also has had pleural effusions.  Had a chest x-[place] done in November.  He does complain of some blurred vision and he feels like this is coming from taking the Lasix.  His blood pressure readings at home of been running in the 125 range over the 60 range even though it was elevated here in the office.  Today he will get a CBC BMP and follow-up chest x-[place].  Along with liver function test.  Patient did bring in a paper reviewing what has happened to him medically over the past several weeks.  We made a copy of this and we will scan it into the record and gave him the original report back.  He is seeing Dr. Bettina Gavia with [facility].  He has been taking Lasix 40 daily and says that blood pressures at home have been good at 125/60.  His weight today is down a couple pounds from when it was checked recently.  He has had blurred vision from the Lasix and I told him that sometimes it takes the eyes several weeks to adjust to change in fluid pills and that this could be part of the reason for the blurred vision.  Since his blood pressures are good however we will try reducing his Lasix and he will record blood pressure readings twice daily as well as get his weight twice daily and take this list with him when he goes to see Dr. Monday.  I did discuss the blurred vision and that I felt if the blurred vision persisted beyond 3-4 more weeks that he should go back and see his ophthalmologist.  Today he did denies any chest pain or shortness of breath.  Denies any trouble with his stomach including nausea vomiting diarrhea blood in the stool or black tarry bowel movements.  He is passing his water well other than increased frequency  with Lasix.   Patient Active Problem List   Diagnosis Date Noted  . Osteoarthritis of spine with radiculopathy, lumbar region 01/08/2018  . Thyroid disease   . Aortic atherosclerosis ([place]) 05/02/2017  . Thrombocytopenia ([place]) 07/10/2016  . Hereditary and idiopathic peripheral neuropathy 10/16/2015  . Mononeuropathy 10/04/2015  . Allergic rhinitis 05/06/2014  . Depression 08/03/2013  . Sick sinus syndrome ([place]) 07/31/2012  . Hypertensive heart disease without CHF   . Chronic atrial fibrillation ([place])   . Coronary artery disease involving native coronary artery of native heart with angina pectoris ([place])   . History of TIAs   . Lumbar disc disease   . GERD (gastroesophageal reflux disease)   . Chronic anticoagulation   . BPH (benign prostatic hypertrophy)   . Hyperlipidemia    Outpatient Encounter Medications as of 12/01/2018  Medication Sig  . Calcium Carb-Cholecalciferol (CALCIUM + D3) 600-200 MG-UNIT TABS Take 0.25 tablets by mouth daily.  . Cholecalciferol (VITAMIN D-3) 5000 UNITS TABS Take 1 tablet by mouth daily.   Marland Kitchen doxazosin (CARDURA) 8 MG tablet Take 1 tablet (8 mg total) by mouth at bedtime.  Water engineer Bandages & Supports (MEDICAL COMPRESSION SOCKS) MISC 1 each by Does not apply route daily.  . furosemide (LASIX) 40 MG tablet Take 1  tablet (40 mg total) by mouth daily.  . hydrALAZINE (APRESOLINE) 25 MG tablet Take 0.5 tablets (12.5 mg total) by mouth 3 (three) times daily.  Marland Kitchen levothyroxine (SYNTHROID, LEVOTHROID) 50 MCG tablet Take 1 tablet (50 mcg total) by mouth daily.  . mirtazapine (REMERON) 7.5 MG tablet Take 0.5 tablets (3.75 mg total) by mouth at bedtime.  . Multiple Vitamin (MULTIVITAMIN) tablet Take 1 tablet by mouth daily.    . ramipril (ALTACE) 10 MG capsule TAKE 1 CAPSULE DAILY  . rosuvastatin (CRESTOR) 5 MG tablet TAKE 1 TABLET DAILY AS DIRECTED (Patient taking differently: Takes on Mon, Wed, and Fri)  . vitamin C (ASCORBIC ACID) 500 MG tablet Take 500 mg by  mouth daily. Take 1/4 tablet by mouth daily.  Marland Kitchen warfarin (COUMADIN) 5 MG tablet TAKE AS DIRECTED   No facility-administered encounter medications on file as of 12/01/2018.      Review of Systems  Constitutional: Negative.   HENT: Negative.  Negative for congestion.   Eyes: Negative.   Respiratory: Negative.  Negative for cough and shortness of breath.   Cardiovascular: Negative.  Leg swelling: better.  Gastrointestinal: Negative.   Endocrine: Negative.   Genitourinary: Negative.   Musculoskeletal: Negative.   Skin: Negative.  Wound: better/ healing   Allergic/Immunologic: Negative.   Neurological: Negative.   Hematological: Negative.   Psychiatric/Behavioral: Negative.        Objective:   Physical Exam Vitals signs and nursing note reviewed.  Constitutional:      Appearance: Normal appearance. He is well-developed and normal weight.     Comments: The patient is pleasant and alert and keeping good records of his current medical condition and looks much younger than his stated age of 70 years.  HENT:     Head: Normocephalic and atraumatic.     Right Ear: External ear normal. There is impacted cerumen.     Left Ear: Tympanic membrane, ear canal and external ear normal. There is no impacted cerumen.     Nose: Nose normal.     Mouth/Throat:     Mouth: Mucous membranes are moist.     Pharynx: Oropharynx is clear. No oropharyngeal exudate.  Eyes:     General: No scleral icterus.       Right eye: No discharge.        Left eye: No discharge.     Extraocular Movements: Extraocular movements intact.     Conjunctiva/sclera: Conjunctivae normal.     Pupils: Pupils are equal, round, and reactive to light.     Comments: No sign of any jaundice.  Neck:     Musculoskeletal: Normal range of motion and neck supple.     Thyroid: No thyromegaly.     Vascular: No carotid bruit.     Trachea: No tracheal deviation.     Comments: No bruits thyromegaly or anterior cervical  adenopathy Cardiovascular:     Rate and Rhythm: Normal rate. Rhythm irregular.     Pulses: Normal pulses.     Heart sounds: Normal heart sounds. No murmur.     Comments: The heart is irregular irregular at 72/min Pulmonary:     Effort: Pulmonary effort is normal. No respiratory distress.     Breath sounds: Normal breath sounds. No wheezing or rales.     Comments: Clear anteriorly and posteriorly and no axillary adenopathy or chest wall masses Chest:     Chest wall: No tenderness.  Abdominal:     General: Abdomen is flat. Bowel sounds are normal.  Palpations: Abdomen is soft. There is no mass.     Tenderness: There is no abdominal tenderness. There is no guarding.  Musculoskeletal: Normal range of motion.        General: Tenderness present.     Right lower leg: No edema.     Left lower leg: No edema.     Comments: Lower extremities are tender to palpation but no edema noted today.  Lymphadenopathy:     Cervical: No cervical adenopathy.  Skin:    General: Skin is warm and dry.     Coloration: Skin is not jaundiced or pale.     Findings: No erythema or rash.     Comments: Dry peeling skin on both lower extremities  Neurological:     General: No focal deficit present.     Mental Status: He is alert and oriented to person, place, and time.     Cranial Nerves: No cranial nerve deficit.     Deep Tendon Reflexes: Reflexes are normal and symmetric. Reflexes normal.  Psychiatric:        Mood and Affect: Mood normal.        Behavior: Behavior normal.        Thought Content: Thought content normal.        Judgment: Judgment normal.   Patient says home blood pressures have been running in the 125/60 range.    BP (!) 164/79 (BP Location: Left Arm)   Pulse 75   Temp (!) 96.8 F (36 C) (Oral)   Ht 6' (1.829 m)   Wt 186 lb (84.4 kg)   BMI 25.23 kg/m   Assessment:     1. Pleural effusion - DG Chest 2 View; Future - BMP8+EGFR - CBC with Differential/Platelet - Hepatic function  panel  2. Edema, unspecified type -This is improved and we will reduce Lasix because of visual issues which seem to be associated with the Lasix but we will monitor this closely and if blood pressure goes back up patient will increase Lasix and take 40 on Monday Wednesday and Friday and 20 on all other days. - DG Chest 2 View; Future - BMP8+EGFR - CBC with Differential/Platelet - Hepatic function panel  3. Chronic atrial fibrillation (HCC) -Continue with Coumadin as currently doing  4. Chronic anticoagulation -Continue with Coumadin  5. Cellulitis of lower extremity, unspecified laterality -This has resolved.  6.  Ear cerumen right ear canal -Irrigation to remove cerumen     Plan:     Patient Instructions  Patient does complain with blurred vision and we have asked him to take blood pressures twice daily until he sees the cardiologist the day after Christmas We have also asked him to get a weight twice daily and take both of these readings with him when he goes to see the cardiologist He should also take his blood pressure monitor and see how it compares to readings in the cardiologist office and make sure that it is accurate He should continue to watch his sodium intake closely He will reduce the Lasix to 20 mg daily and if the blood pressure goes higher than what it typically has been running at home which he says is around 125/60 he will increase the Lasix back to 40 on Monday Wednesday and Friday and 10 on all the other days If the blurred vision is not better in 4 weeks he will schedule another visit with his eye doctor to follow-up on this who is Dr. Katy Fitch See the cardiologist as planned  the day after Christmas Be sure and take blood pressure monitor with you and readings of weight and blood pressure from home.  Arrie Senate MD    .

## 2018-12-01 NOTE — Patient Instructions (Signed)
Patient does complain with blurred vision and we have asked him to take blood pressures twice daily until he sees the cardiologist the day after Christmas We have also asked him to get a weight twice daily and take both of these readings with him when he goes to see the cardiologist He should also take his blood pressure monitor and see how it compares to readings in the cardiologist office and make sure that it is accurate He should continue to watch his sodium intake closely He will reduce the Lasix to 20 mg daily and if the blood pressure goes higher than what it typically has been running at home which he says is around 125/60 he will increase the Lasix back to 40 on Monday Wednesday and Friday and 10 on all the other days If the blurred vision is not better in 4 weeks he will schedule another visit with his eye doctor to follow-up on this who is Dr. Katy Fitch See the cardiologist as planned the day after Christmas Be sure and take blood pressure monitor with you and readings of weight and blood pressure from home.

## 2018-12-02 ENCOUNTER — Ambulatory Visit: Payer: Medicare Other | Admitting: Family Medicine

## 2018-12-02 LAB — CBC WITH DIFFERENTIAL/PLATELET
Basophils Absolute: 0 10*3/uL (ref 0.0–0.2)
Basos: 0 %
EOS (ABSOLUTE): 0.1 10*3/uL (ref 0.0–0.4)
Eos: 2 %
Hematocrit: 37.7 % (ref 37.5–51.0)
Hemoglobin: 12.5 g/dL — ABNORMAL LOW (ref 13.0–17.7)
Immature Grans (Abs): 0 10*3/uL (ref 0.0–0.1)
Immature Granulocytes: 0 %
Lymphocytes Absolute: 2.4 10*3/uL (ref 0.7–3.1)
Lymphs: 39 %
MCH: 29.8 pg (ref 26.6–33.0)
MCHC: 33.2 g/dL (ref 31.5–35.7)
MCV: 90 fL (ref 79–97)
Monocytes Absolute: 0.4 10*3/uL (ref 0.1–0.9)
Monocytes: 7 %
Neutrophils Absolute: 3.1 10*3/uL (ref 1.4–7.0)
Neutrophils: 52 %
Platelets: 168 10*3/uL (ref 150–450)
RBC: 4.19 x10E6/uL (ref 4.14–5.80)
RDW: 14.3 % (ref 12.3–15.4)
WBC: 6.1 10*3/uL (ref 3.4–10.8)

## 2018-12-02 LAB — BMP8+EGFR
BUN/Creatinine Ratio: 13 (ref 10–24)
BUN: 11 mg/dL (ref 8–27)
CO2: 25 mmol/L (ref 20–29)
Calcium: 9 mg/dL (ref 8.6–10.2)
Chloride: 103 mmol/L (ref 96–106)
Creatinine, Ser: 0.88 mg/dL (ref 0.76–1.27)
GFR calc Af Amer: 89 mL/min/{1.73_m2} (ref >59)
GFR calc non Af Amer: 77 mL/min/{1.73_m2} (ref >59)
Glucose: 72 mg/dL (ref 65–99)
Potassium: 4 mmol/L (ref 3.5–5.2)
Sodium: 145 mmol/L — ABNORMAL HIGH (ref 134–144)

## 2018-12-02 LAB — HEPATIC FUNCTION PANEL
ALT: 14 IU/L (ref 0–44)
AST: 22 IU/L (ref 0–40)
Albumin: 4.1 g/dL (ref 3.5–4.7)
Alkaline Phosphatase: 101 IU/L (ref 39–117)
Bilirubin Total: 0.4 mg/dL (ref 0.0–1.2)
Bilirubin, Direct: 0.14 mg/dL (ref 0.00–0.40)
Total Protein: 6.3 g/dL (ref 6.0–8.5)

## 2018-12-08 ENCOUNTER — Ambulatory Visit: Payer: Medicare Other | Admitting: Nurse Practitioner

## 2018-12-08 NOTE — Progress Notes (Signed)
Cardiology Office Note:    Date:  12/11/2018   ID:  Dalton Vaughan, DOB Feb 17, 1929, MRN [id number]  PCP:  Chipper Herb, MD  Cardiologist:  Shirlee More, MD    Referring MD: Chipper Herb, MD    ASSESSMENT:    1. Chronic atrial fibrillation ([place])   2. Chronic anticoagulation   3. Pure hypercholesterolemia   4. Coronary artery disease involving native coronary artery of native heart with angina pectoris ([place])   5. Hypertensive heart disease without CHF   6. Chronic venous insufficiency    PLAN:    In order of problems listed above: Discomfort New York Heart Association class I continue current medical treatment and I would not advise an ischemia evaluation at this time  1. Stable rate is controlled without suppressant medications he opts to continue warfarin goal INR is 2.5 managed with his PCP he is quite compliant 2. Stable continue warfarin this is a reasonable alternative although in general use direct anticoagulants 3. Stable continue his high intensity statin with his high risk status CAD his most recent lipid profile 09/24/2018 was on target cholesterol 156 HDL 45 LDL 95 normal liver function 4. Stable CAD having no angina New York Heart Association class I at this time I would not advise an ischemia evaluation 5. Stable blood pressure at home in the 1 78-2 40 systolic range on a multidrug regimen including furosemide with his chronic venous insufficiency hydralazine ACE inhibitor and alpha-blocker Cardura 6. Improved advised him to purchase compression hose gave him a list of places he can get it and to continue his diuretic he also needs to use a moisturizer daily   Next appointment: 1 year   Medication Adjustments/Labs and Tests Ordered: Current medicines are reviewed at length with the patient today.  Concerns regarding medicines are outlined above.  No orders of the defined types were placed in this encounter.  No orders of the defined types were placed in this  encounter.   Chief Complaint  Patient presents with  . Follow-up  . Coronary Artery Disease    History of Present Illness:    Dalton Vaughan is a 84 y.o. male with a hx of coronary artery disease, chronic atrial fibrillation anticoagulated previous coronary artery bypass surgery sick sinus syndrome hypertensive heart disease and hyperlipidemia  last seen 01/07/18. Compliance with diet, lifestyle and medications: Yes Past Medical History:  Diagnosis Date  . Anal fissure   . Atrial fibrillation ([place])   . BPH (benign prostatic hypertrophy)   . CAD (coronary artery disease)    Dr. Wynonia Lawman  . Cataract   . Depression 2004  . Diverticulosis   . GERD (gastroesophageal reflux disease)   . History of TIAs   . Hyperlipidemia   . Hypertension   . Hypertensive heart disease without CHF   . Hypothyroidism   . Internal hemorrhoids   . Lumbar disc disease   . Oral bleeding 08/20/2012   Following molar tooth extraction July, 2013  . Ruptured disk 1995  . Shingles   . Thyroid disease   . Vitamin D deficiency   . Vitreous detachment     Past Surgical History:  Procedure Laterality Date  . bilateral cataract surgery  6/07  . CARDIAC CATHETERIZATION    . CORONARY ARTERY BYPASS GRAFT  12/99    Dr. Servando Snare   . cysloscopy  8/08  . EYE SURGERY    . HERNIA REPAIR  1997   right  . herninated disc  1995  .  PROSTATE BIOPSY  8/08  . TONSILLECTOMY AND ADENOIDECTOMY      Current Medications: Current Meds  Medication Sig  . Calcium Carb-Cholecalciferol (CALCIUM + D3) 600-200 MG-UNIT TABS Take 0.25 tablets by mouth daily.  . Cholecalciferol (VITAMIN D-3) 5000 UNITS TABS Take 1 tablet by mouth daily.   Marland Kitchen doxazosin (CARDURA) 8 MG tablet Take 1 tablet (8 mg total) by mouth at bedtime.  Water engineer Bandages & Supports (MEDICAL COMPRESSION SOCKS) MISC 1 each by Does not apply route daily.  . furosemide (LASIX) 40 MG tablet Take 1 tablet (40 mg total) by mouth daily.  . hydrALAZINE (APRESOLINE) 25 MG  tablet Take 0.5 tablets (12.5 mg total) by mouth 3 (three) times daily.  Marland Kitchen levothyroxine (SYNTHROID, LEVOTHROID) 50 MCG tablet Take 1 tablet (50 mcg total) by mouth daily.  . mirtazapine (REMERON) 7.5 MG tablet Take 0.5 tablets (3.75 mg total) by mouth at bedtime.  . Multiple Vitamin (MULTIVITAMIN) tablet Take 1 tablet by mouth daily.    . ramipril (ALTACE) 10 MG capsule TAKE 1 CAPSULE DAILY  . rosuvastatin (CRESTOR) 5 MG tablet TAKE 1 TABLET DAILY AS DIRECTED (Patient taking differently: Takes on Mon, Wed, and Fri)  . vitamin C (ASCORBIC ACID) 500 MG tablet Take 500 mg by mouth daily. Take 1/4 tablet by mouth daily.  Marland Kitchen warfarin (COUMADIN) 5 MG tablet TAKE AS DIRECTED     Allergies:   Paxil [paroxetine hcl]; Eliquis [apixaban]; Penicillins; Pravachol [pravastatin sodium]; Zetia [ezetimibe]; and Vytorin [ezetimibe-simvastatin]   Social History   Socioeconomic History  . Marital status: Married    Spouse name: [name]   . Number of children: 1  . Years of education: 32  . Highest education level: Not on file  Occupational History  . Occupation: Retired    Comment: Social research officer, government x 27 years  . 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 Financial resource strain: Not hard at all  . Food insecurity:    Worry: Never true    Inability: Never true  . Transportation needs:    Medical: No    Non-medical: No  Tobacco Use  . Smoking status: Former Smoker    Types: Cigarettes    Start date: 12/17/1948    Last attempt to quit: 12/17/1966    Years since quitting: 52.0  . Smokeless tobacco: Never Used  Substance and Sexual Activity  . Alcohol use: Yes    Alcohol/week: 1.0 standard drinks    Types: 1 Standard drinks or equivalent per week    Comment: Occassionally  . Drug use: No  . Sexual activity: Never  Lifestyle  . Physical activity:    Days per week: 5 days    Minutes per session: 50 min  . Stress: Only a little  Relationships  .  Social connections:    Talks on phone: More than three times a week    Gets together: More than three times a week    Attends religious service: Never    Active member of club or organization: No    Attends meetings of clubs or organizations: Never    Relationship status: Married  Other Topics Concern  . Not on file  Social History Narrative   Married.  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 There is no history of Colon cancer, Colon polyps, Kidney disease, Esophageal cancer, Gallbladder disease, or Diabetes. ROS:   Please see the history of present illness.    All other systems reviewed and are negative.  EKGs/Labs/Other Studies Reviewed:    The following studies were reviewed today:  EKG:  EKG ordered today.  The ekg ordered today demonstrates atrial fibrillation controlled rate  Recent Labs: 09/24/2018: TSH 2.320 11/07/2018: NT-Pro BNP 2,681 12/01/2018: ALT 14; BUN 11; Creatinine, Ser 0.88; Hemoglobin 12.5; Platelets 168; Potassium 4.0; Sodium 145  Recent Lipid Panel    Component Value Date/Time   CHOL 156 09/24/2018 1131   TRIG 78 09/24/2018 1131   TRIG 78 07/09/2016 0813   HDL 45 09/24/2018 1131   HDL 46 07/09/2016 0813   CHOLHDL 3.5 09/24/2018 1131   CHOLHDL 3.4 04/22/2013 1156   VLDL 16 04/22/2013 1156   LDLCALC 95 09/24/2018 1131   LDLCALC 109 (H) 09/07/2014 0803    Physical Exam:    VS:  BP (!) 144/68   Pulse 93   Ht 6' (1.829 m)   Wt 187 lb 1.9 oz (84.9 kg)   SpO2 93%   BMI 25.38 kg/m     Wt Readings from Last 3 Encounters:  12/11/18 187 lb 1.9 oz (84.9 kg)  12/01/18 186 lb (84.4 kg)  11/25/18 188 lb (85.3 kg)     GEN:   Well nourished, well developed in no acute distress HEENT: Normal NECK: No JVD; No carotid bruits LYMPHATICS: No lymphadenopathy CARDIAC: Irr Irr variable S1RRR, no murmurs, rubs, gallops RESPIRATORY:  Clear to auscultation without rales, wheezing or rhonchi  ABDOMEN: Soft, non-tender, non-distended MUSCULOSKELETAL: 2-3+ ankle to the mid calf bilateral pitting edema chronic stasis changes; No deformity  SKIN: Warm and dry NEUROLOGIC:  Alert and oriented x 3 PSYCHIATRIC:  Normal affect    Signed, Shirlee More, MD  12/11/2018 3:33 PM    [facility]

## 2018-12-11 ENCOUNTER — Encounter: Payer: Self-pay | Admitting: Cardiology

## 2018-12-11 ENCOUNTER — Ambulatory Visit (INDEPENDENT_AMBULATORY_CARE_PROVIDER_SITE_OTHER): Payer: Medicare Other | Admitting: Cardiology

## 2018-12-11 ENCOUNTER — Ambulatory Visit: Payer: Medicare Other | Admitting: Nurse Practitioner

## 2018-12-11 VITALS — BP 144/68 | HR 93 | Ht 72.0 in | Wt 187.1 lb

## 2018-12-11 DIAGNOSIS — I872 Venous insufficiency (chronic) (peripheral): Secondary | ICD-10-CM | POA: Diagnosis not present

## 2018-12-11 DIAGNOSIS — E7800 Pure hypercholesterolemia, unspecified: Secondary | ICD-10-CM

## 2018-12-11 DIAGNOSIS — Z7901 Long term (current) use of anticoagulants: Secondary | ICD-10-CM

## 2018-12-11 DIAGNOSIS — I482 Chronic atrial fibrillation, unspecified: Secondary | ICD-10-CM | POA: Diagnosis not present

## 2018-12-11 DIAGNOSIS — I25119 Atherosclerotic heart disease of native coronary artery with unspecified angina pectoris: Secondary | ICD-10-CM | POA: Diagnosis not present

## 2018-12-11 DIAGNOSIS — I119 Hypertensive heart disease without heart failure: Secondary | ICD-10-CM

## 2018-12-11 NOTE — Patient Instructions (Addendum)
Medication Instructions:  Your physician recommends that you continue on your current medications as directed. Please refer to the Current Medication list given to you today.  If you need a refill on your cardiac medications before your next appointment, please call your pharmacy.   Lab work: None  If you have labs (blood work) drawn today and your tests are completely normal, you will receive your results only by: Marland Kitchen MyChart Message (if you have MyChart) OR . A paper copy in the mail If you have any lab test that is abnormal or we need to change your treatment, we will call you to review the results.  Testing/Procedures: You had an EKG today.   Follow-Up: At [facility], you and your health needs are our priority.  As part of our continuing mission to provide you with exceptional heart care, we have created designated Provider Care Teams.  These Care Teams include your primary Cardiologist (physician) and Advanced Practice Providers (APPs -  Physician Assistants and Nurse Practitioners) who all work together to provide you with the care you need, when you need it. You will need a follow up appointment in 1 years.  Please call our office 2 months in advance to schedule this appointment.       Locations to Purchase Compression Stockings:  [facility] [address] [place]. [place], [place] [address] [phone] (fax) [email or web address] *Mon-Fri 9am-4:30pm* *Closed on Holidays*  Putting on Compression Stockings Turn the stocking inside-out, then fit it over your toes and heel.  Roll the stocking up your leg.  Once stockings are on, make sure the top of the stocking is about two fingers' width below the crease of the knee (or the groin if you wear thigh-high stockings).  Use equipment, such as a stocking donner, or wear rubber gloves to make it easier to put on compression stockings.   Elastic compression stockings are prescribed to treat many  vein problems. Wearing them may be the most important thing you do to manage your symptoms. The stockings fit tightly aroundyour ankle, gradually reducing in pressure as they go up your legs. This helps keep blood flowing toyour heart. As a result, swelling is reduced. Your doctor will prescribe stockings at a safe pressure for you. He or she will also tell you how often to wear and remove the stockings. Follow these instructions closely.Also, do not buy or wear compression stockings without first seeing your doctor. Tips for Wear and Care To wear stockings safely and to get the most benefit:  Wear the length prescribed by your doctor.  Pull them to the designated height and no farther. Don't let them bunch at the top. This can restrict blood flow and increase swelling.  Wear the stockingsfor the amount of time your doctor recommends. Replace them when they start to feel loose. This will likely be every 3 to 6 months.  Remove them as your doctor directs. When removed, wash your legs. Then check your legs and feet for sores. Call your doctor if you find a sore. Don't put the stockings back on unless your doctor directs.  Wash the stockings as instructed. They may need to be handwashed.

## 2018-12-15 ENCOUNTER — Other Ambulatory Visit: Payer: Self-pay | Admitting: Family Medicine

## 2019-01-22 ENCOUNTER — Other Ambulatory Visit: Payer: Self-pay | Admitting: Family Medicine

## 2019-01-22 ENCOUNTER — Telehealth: Payer: Self-pay | Admitting: *Deleted

## 2019-01-22 MED ORDER — HYDRALAZINE HCL 25 MG PO TABS: 12.5000 mg | ORAL_TABLET | Freq: Three times a day (TID) | ORAL | 3 refills | Status: DC

## 2019-01-22 NOTE — Telephone Encounter (Signed)
*  STAT* If patient is at the pharmacy, call can be transferred to refill team.   1. Which medications need to be refilled? (please list name of each medication and dose if known) Hydralazine 25 mg 0.5 tabs tid  2. Which pharmacy/location (including street and city if local pharmacy) is medication to be sent to?[facility]  3. Do they need a 30 day or 90 day supply? [facility]

## 2019-01-26 ENCOUNTER — Encounter: Payer: Self-pay | Admitting: *Deleted

## 2019-01-26 ENCOUNTER — Ambulatory Visit (INDEPENDENT_AMBULATORY_CARE_PROVIDER_SITE_OTHER): Payer: Medicare Other | Admitting: *Deleted

## 2019-01-26 VITALS — BP 188/75 | HR 95 | Ht 72.0 in | Wt 186.0 lb

## 2019-01-26 DIAGNOSIS — Z0000 Encounter for general adult medical examination without abnormal findings: Secondary | ICD-10-CM

## 2019-01-26 DIAGNOSIS — Z23 Encounter for immunization: Secondary | ICD-10-CM

## 2019-01-26 NOTE — Progress Notes (Addendum)
Subjective:   Dalton Vaughan is a 85 y.o. male who presents for a subsequent Medicare Annual Wellness Visit.  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  He enjoys woodworking and target shooting.  He lives at home with his wife.  He states his wife suffers from dementia, and caring for her is stressful at times.  He attributes his elevated blood pressure to this stress.  Per Dr. Laurance Flatten, advised patient to reduce salt intake, take all blood pressure medications as prescribed, and come back in a few days for a blood pressure recheck.  If blood pressure is not improved at that time Dr. Laurance Flatten may adjust medications.  Patient agreeable with plan.  Patient Care Team: Chipper Herb, MD as PCP - General (Family Medicine) Jacolyn Reedy, MD as Consulting Physician (Cardiology) Deboraha Sprang, MD (Cardiology) Raynelle Bring, MD as Consulting Physician (Urology) Pyrtle, Lajuan Lines, MD as Consulting Physician (Gastroenterology) Clent Jacks, MD as Consulting Physician (Ophthalmology)  Hospitalizations, surgeries, and ER visits in previous 12 months No hospitalizations, ER visits, or surgeries this past year.   Review of Systems    Patient reports that his overall health is worse compared to last year.  Had cellulitis in December 2019.  States he has not felt like he has as much energy as normal since then.     Cardiac Risk Factors include: dyslipidemia;hypertension;male gender  Musculoskeletal - back pain  All other systems negative       Current Medications (verified) Outpatient Encounter Medications as of 01/26/2019  Medication Sig  . ALTACE 10 MG capsule TAKE 1 CAPSULE DAILY  . Calcium Carb-Cholecalciferol (CALCIUM + D3) 600-200 MG-UNIT TABS Take 0.25 tablets by mouth daily.  . Cholecalciferol (VITAMIN D-3) 5000 UNITS TABS Take 1 tablet by mouth daily.   Marland Kitchen doxazosin (CARDURA) 8 MG tablet Take 1 tablet (8 mg total) by mouth at  bedtime.  Water engineer Bandages & Supports (MEDICAL COMPRESSION SOCKS) MISC 1 each by Does not apply route daily.  . furosemide (LASIX) 40 MG tablet Take 1 tablet (40 mg total) by mouth daily.  . hydrALAZINE (APRESOLINE) 25 MG tablet Take 0.5 tablets (12.5 mg total) by mouth 3 (three) times daily.  Marland Kitchen levothyroxine (SYNTHROID, LEVOTHROID) 50 MCG tablet Take 1 tablet (50 mcg total) by mouth daily.  . mirtazapine (REMERON) 7.5 MG tablet TAKE ONE-HALF (1/2) TABLET AT BEDTIME  . Multiple Vitamin (MULTIVITAMIN) tablet Take 1 tablet by mouth daily.    . rosuvastatin (CRESTOR) 5 MG tablet TAKE 1 TABLET DAILY AS DIRECTED (Patient taking differently: Takes on Mon, Wed, and Fri)  . vitamin C (ASCORBIC ACID) 500 MG tablet Take 500 mg by mouth daily. Take 1/4 tablet by mouth daily.  Marland Kitchen warfarin (COUMADIN) 5 MG tablet TAKE AS DIRECTED   No facility-administered encounter medications on file as of 01/26/2019.     Allergies (verified) Paxil [paroxetine hcl]; Eliquis [apixaban]; Penicillins; Pravachol [pravastatin sodium]; Zetia [ezetimibe]; and Vytorin [ezetimibe-simvastatin]   History: Past Medical History:  Diagnosis Date  . Anal fissure   . Atrial fibrillation ([place])   . BPH (benign prostatic hypertrophy)   . CAD (coronary artery disease)    Dr. Wynonia Lawman  . Cataract   . Depression 2004  . Diverticulosis   . GERD (gastroesophageal reflux disease)   . History of TIAs   . Hyperlipidemia   . Hypertension   . Hypertensive heart disease without CHF   . Hypothyroidism   .  Internal hemorrhoids   . Lumbar disc disease   . Oral bleeding 08/20/2012   Following molar tooth extraction July, 2013  . Ruptured disk 1995  . Shingles   . Thyroid disease   . Vitamin D deficiency   . Vitreous detachment    Past Surgical History:  Procedure Laterality Date  . bilateral cataract surgery  6/07  . CARDIAC CATHETERIZATION    . CORONARY ARTERY BYPASS GRAFT  12/99    Dr. Servando Snare   . cysloscopy  8/08  . EYE SURGERY     . HERNIA REPAIR  1997   right  . herninated disc  1995  . PROSTATE BIOPSY  8/08  . TONSILLECTOMY AND ADENOIDECTOMY     Family History  Problem Relation Age of Onset  . Dementia Mother   . Arthritis Mother   . Cancer Brother        prostate  . Asthma Sister   . Heart disease Paternal Aunt   . Heart disease Paternal Uncle   . Hypertension Maternal Grandmother   . CVA Maternal Grandmother   . CVA Paternal Grandmother   . Hepatitis C Son   . Arthritis Son   . Colon cancer Neg Hx   . Colon polyps Neg Hx   . Kidney disease Neg Hx   . Esophageal cancer Neg Hx   . Gallbladder disease Neg Hx   . Diabetes Neg Hx    Social History   Socioeconomic History  . Marital status: Married    Spouse name: Dalton Vaughan   . Number of children: 1  . Years of education: 38  . Highest education level: Not on file  Occupational History  . Occupation: Retired    Comment: Social research officer, government x 27 years  . 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 Financial resource strain: Not hard at all  . Food insecurity:    Worry: Never true    Inability: Never true  . Transportation needs:    Medical: No    Non-medical: No  Tobacco Use  . Smoking status: Former Smoker    Packs/day: 1.00    Years: 18.00    Pack years: 18.00    Types: Cigarettes    Start date: 12/17/1948    Last attempt to quit: 12/17/1966    Years since quitting: 52.1  . Smokeless tobacco: Never Used  Substance and Sexual Activity  . Alcohol use: Yes    Alcohol/week: 1.0 standard drinks    Types: 1 Standard drinks or equivalent per week    Comment: Occasionally  . Drug use: No  . Sexual activity: Not on file  Lifestyle  . Physical activity:    Days per week: 5 days    Minutes per session: 50 min  . Stress: To some extent  Relationships  . Social connections:    Talks on phone: More than three times a week    Gets together: More than three times a week    Attends religious  service: Never    Active member of club or organization: No    Attends meetings of clubs or organizations: Never    Relationship status: Married  Other Topics Concern  . Not on file  Social History Narrative   Married.  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 N  Vision? N  Difficulty concentrating or making decisions? N  Walking or climbing stairs? Y  Comment knee and back pain  Dressing or bathing? N  Doing errands, shopping? N  Preparing Food and eating ? N  Using the Toilet? N  In the past six months, have you accidently leaked urine? Y  Comment occasional with coughing   Do you have problems with loss of bowel control? N  Managing your Medications? N  Managing your Finances? N  Housekeeping or managing your Housekeeping? N  Some recent data might be hidden     Exercise Current Exercise Habits: Home exercise routine, Type of exercise: treadmill;Other - see comments(Stationary bicycle ), Time (Minutes): 45, Frequency (Times/Week): 5, Weekly Exercise (Minutes/Week): 225, Intensity: Moderate, Exercise limited by: orthopedic condition(s)  Diet Consumes 3 meals a day and 0 snacks a day.  The patient feels that they mostly follow a Regular diet.  Diet History  Patient states he usually has oatmeal or raisin bran and milk for breakfast, vegetables, beans, and sometimes baked chicken for lunch, usually has fruit for supper.  He states he does not enjoy eating a lot of meat, usually has chicken 2-3 times per week.  He eats a small supper because he is not very hungry in the evenings. He states he has access to all the food he needs.    Depression Screen PHQ 2/9 Scores 01/26/2019 01/26/2019  12/01/2018 11/17/2018 11/14/2018 11/11/2018 11/03/2018  PHQ - 2 Score 0 0 0 0 0 0 0     Fall Risk Fall Risk  01/26/2019 01/26/2019 12/01/2018 11/17/2018 11/14/2018  Falls in the past year? 0 0 0 0 0  Number falls in past yr: - - - - -  Injury with Fall? - - - - -     Objective:    Today's Vitals   01/26/19 1000 01/26/19 1044  BP: (!) 190/110 (!) 188/75  Pulse: 96 95  Weight: 186 lb (84.4 kg)   Height: 6' (1.829 m)   PainSc: 4    PainLoc: Back    Body mass index is 25.23 kg/m.  Advanced Directives 01/26/2019 01/23/2018 05/10/2016 09/01/2015 07/31/2012  Does Patient Have a Medical Advance Directive? Yes Yes Yes Yes Patient has advance directive, copy not in chart  Type of Advance Directive [place];Living will Living will;Healthcare Power of [place];Living will Living will  Does patient want to make changes to medical advance directive? No - Patient declined No - Patient declined - No - Patient declined -  Copy of [place] in Chart? No - copy requested Yes - No - copy requested Copy requested from family  Pre-existing out of facility DNR order (yellow form or pink MOST form) - - - - No    Hearing/Vision  No hearing or vision deficits noted during visit.  Cognitive Function: MMSE - Mini Mental State Exam 01/26/2019 01/23/2018 09/01/2015  Orientation to time 5 5 5   Orientation to Place 5 5 5   Registration 3 3 3   Attention/ Calculation 5 5 5   Recall 3 3 3   Language- name 2 objects 2 2 2   Language- repeat 1 1 1   Language- follow 3  step command 3 3 3   Language- read & follow direction 1 1 1   Write a sentence 1 1 1   Copy design 1 1 1   Total score 30 30 30            Immunizations and Health Maintenance Immunization History  Administered Date(s) Administered  . Influenza, High Dose Seasonal PF 11/05/2016, 10/08/2017, 09/24/2018  . Influenza,inj,Quad PF,6+ Mos 09/15/2013, 09/22/2014, 10/04/2015  .  Pneumococcal Conjugate-13 01/04/2014  . Pneumococcal Polysaccharide-23 12/17/1996, 11/15/2015   There are no preventive care reminders to display for this patient. Health Maintenance  Topic Date Due  . TETANUS/TDAP  09/25/2019 (Originally 12/18/2015)  . INFLUENZA VACCINE  Completed  . PNA vac Low Risk Adult  Completed   Shingrix vaccine given today.       Assessment:   This is a routine wellness examination for Drayce.    Plan:    Goals    . Blood Pressure < 140/90     Continue exercising Do deep breathing exercises          Health Maintenance & Additional Screening Recommendations: Advanced directives: has an advanced directive - a copy HAS NOT been provided.  Lung: Low Dose CT Chest recommended if Age 54-80 years, 30 pack-year currently smoking OR have quit w/in 15years. Patient does not qualify. Hepatitis C Screening recommended: not applicable   Today's Orders   Keep f/u with Chipper Herb, MD and any other specialty appointments you may have Continue current medications Move carefully to avoid falls.  Aim for at least 150 minutes of moderate activity a week.  Read or work on puzzles daily Stay connected with friends and family  I have personally reviewed and noted the following in the patient's chart:   . Medical and social history . Use of alcohol, tobacco or illicit drugs  . Current medications and supplements . Functional ability and status . Nutritional status . Physical activity . Advanced directives . List of other physicians . Hospitalizations, surgeries, and ER visits in previous 12 months . Vitals . Screenings to include cognitive, depression, and falls . Referrals and appointments  In addition, I have reviewed and discussed with patient certain preventive protocols, quality metrics, and best practice recommendations. A written personalized care plan for preventive services as well as general preventive health recommendations were provided to  patient.     Nolberto Hanlon, RN  01/26/2019   I have reviewed and agree with the above AWV documentation.   Arrie Senate MD

## 2019-01-26 NOTE — Patient Instructions (Addendum)
At your convenience, please bring a copy of your Advance Directives (Healthcare Power of Attorney and Living Will) to our office to be filed in your medical record.  Goals    . Blood Pressure < 140/90     Continue exercising Do deep breathing exercises       You received your 1st Shingrix (Shingles) vaccine today.  You will need the 2nd dose after 03/27/2019.    Please follow up with Dalton Vaughan on 01/28/2019 at 3:20 pm, and Dr. [name] on 02/05/2019.   Please have your blood pressure rechecked on 01/28/2019 here at [facility] by the nurse.   Please watch your salt intake and take all medications as prescribed.  Thank you for coming in for your Annual Wellness Visit today.   Preventive Care 32 Years and Older, Male Preventive care refers to lifestyle choices and visits with your health care provider that can promote health and wellness. What does preventive care include?   A yearly physical exam. This is also called an annual well check.  Dental exams once or twice a year.  Routine eye exams. Ask your health care provider how often you should have your eyes checked.  Personal lifestyle choices, including: ? Daily care of your teeth and gums. ? Regular physical activity. ? Eating a healthy diet. ? Avoiding tobacco and drug use. ? Limiting alcohol use. ? Practicing safe sex. ? Taking low doses of aspirin every day. ? Taking vitamin and mineral supplements as recommended by your health care provider. What happens during an annual well check? The services and screenings done by your health care provider during your annual well check will depend on your age, overall health, lifestyle risk factors, and family history of disease. Counseling Your health care provider may ask you questions about your:  Alcohol use.  Tobacco use.  Drug use.  Emotional well-being.  Home and relationship well-being.  Sexual activity.  Eating habits.  History of falls.  Memory and ability  to understand (cognition).  Work and work Statistician. Screening You may have the following tests or measurements:  Height, weight, and BMI.  Blood pressure.  Lipid and cholesterol levels. These may be checked every 5 years, or more frequently if you are over 22 years old.  Skin check.  Lung cancer screening. You may have this screening every year starting at age 49 if you have a 30-pack-year history of smoking and currently smoke or have quit within the past 15 years.  Colorectal cancer screening. All adults should have this screening starting at age 61 and continuing until age 35. You will have tests every 1-10 years, depending on your results and the type of screening test. People at increased risk should start screening at an earlier age. Screening tests may include: ? Guaiac-based fecal occult blood testing. ? Fecal immunochemical test (FIT). ? Stool DNA test. ? Virtual colonoscopy. ? Sigmoidoscopy. During this test, a flexible tube with a tiny camera (sigmoidoscope) is used to examine your rectum and lower colon. The sigmoidoscope is inserted through your anus into your rectum and lower colon. ? Colonoscopy. During this test, a long, thin, flexible tube with a tiny camera (colonoscope) is used to examine your entire colon and rectum.  Prostate cancer screening. Recommendations will vary depending on your family history and other risks.  Hepatitis C blood test.  Hepatitis B blood test.  Sexually transmitted disease (STD) testing.  Diabetes screening. This is done by checking your blood sugar (glucose) after you have not eaten  for a while (fasting). You may have this done every 1-3 years.  Abdominal aortic aneurysm (AAA) screening. You may need this if you are a current or former smoker.  Osteoporosis. You may be screened starting at age 67 if you are at high risk. Talk with your health care provider about your test results, treatment options, and if necessary, the need for  more tests. Vaccines Your health care provider may recommend certain vaccines, such as:  Influenza vaccine. This is recommended every year.  Tetanus, diphtheria, and acellular pertussis (Tdap, Td) vaccine. You may need a Td booster every 10 years.  Varicella vaccine. You may need this if you have not been vaccinated.  Zoster vaccine. You may need this after age 92.  Measles, mumps, and rubella (MMR) vaccine. You may need at least one dose of MMR if you were born in 1957 or later. You may also need a second dose.  Pneumococcal 13-valent conjugate (PCV13) vaccine. One dose is recommended after age 2.  Pneumococcal polysaccharide (PPSV23) vaccine. One dose is recommended after age 53.  Meningococcal vaccine. You may need this if you have certain conditions.  Hepatitis A vaccine. You may need this if you have certain conditions or if you travel or work in places where you may be exposed to hepatitis A.  Hepatitis B vaccine. You may need this if you have certain conditions or if you travel or work in places where you may be exposed to hepatitis B.  Haemophilus influenzae type b (Hib) vaccine. You may need this if you have certain risk factors. Talk to your health care provider about which screenings and vaccines you need and how often you need them. This information is not intended to replace advice given to you by your health care provider. Make sure you discuss any questions you have with your health care provider. Document Released: 12/30/2015 Document Revised: 01/23/2018 Document Reviewed: 10/04/2015 Elsevier Interactive Patient Education  2019 [employer].

## 2019-01-28 ENCOUNTER — Encounter: Payer: Self-pay | Admitting: Pharmacist Clinician (PhC)/ Clinical Pharmacy Specialist

## 2019-02-05 ENCOUNTER — Encounter: Payer: Self-pay | Admitting: Family Medicine

## 2019-02-05 ENCOUNTER — Ambulatory Visit (INDEPENDENT_AMBULATORY_CARE_PROVIDER_SITE_OTHER): Payer: Medicare Other | Admitting: Family Medicine

## 2019-02-05 VITALS — BP 169/73 | HR 58 | Temp 96.0°F | Ht 72.0 in | Wt 181.0 lb

## 2019-02-05 DIAGNOSIS — E039 Hypothyroidism, unspecified: Secondary | ICD-10-CM | POA: Diagnosis not present

## 2019-02-05 DIAGNOSIS — E7800 Pure hypercholesterolemia, unspecified: Secondary | ICD-10-CM

## 2019-02-05 DIAGNOSIS — Z7901 Long term (current) use of anticoagulants: Secondary | ICD-10-CM | POA: Diagnosis not present

## 2019-02-05 DIAGNOSIS — E559 Vitamin D deficiency, unspecified: Secondary | ICD-10-CM

## 2019-02-05 DIAGNOSIS — R7309 Other abnormal glucose: Secondary | ICD-10-CM | POA: Diagnosis not present

## 2019-02-05 DIAGNOSIS — I482 Chronic atrial fibrillation, unspecified: Secondary | ICD-10-CM | POA: Diagnosis not present

## 2019-02-05 DIAGNOSIS — D696 Thrombocytopenia, unspecified: Secondary | ICD-10-CM

## 2019-02-05 DIAGNOSIS — I700 Atherosclerosis of aorta: Secondary | ICD-10-CM

## 2019-02-05 LAB — COAGUCHEK XS/INR WAIVED
INR: 1.5 — ABNORMAL HIGH (ref 0.9–1.1)
Prothrombin Time: 17.5 s

## 2019-02-05 NOTE — Addendum Note (Signed)
Addended by: Zannie Cove on: 02/05/2019 10:46 AM   Modules accepted: Orders

## 2019-02-05 NOTE — Patient Instructions (Addendum)
Medicare Annual Wellness Visit  [place] and the medical providers at [facility] strive to bring you the best medical care.  In doing so we not only want to address your current medical conditions and concerns but also to detect new conditions early and prevent illness, disease and health-related problems.    Medicare offers a yearly Wellness Visit which allows our clinical staff to assess your need for preventative services including immunizations, lifestyle education, counseling to decrease risk of preventable diseases and screening for fall risk and other medical concerns.    This visit is provided free of charge (no copay) for all Medicare recipients. The clinical pharmacists at [facility] have begun to conduct these Wellness Visits which will also include a thorough review of all your medications.    As you primary medical provider recommend that you make an appointment for your Annual Wellness Visit if you have not done so already this year.  You may set up this appointment before you leave today or you may call back ([phone]) and schedule an appointment.  Please make sure when you call that you mention that you are scheduling your Annual Wellness Visit with the clinical pharmacist so that the appointment may be made for the proper length of time.     Continue current medications. Continue good therapeutic lifestyle changes which include good diet and exercise. Fall precautions discussed with patient. If an FOBT was given today- please return it to our front desk. If you are over 38 years old - you may need Prevnar 63 or the adult Pneumonia vaccine.  **Flu shots are available--- please call and schedule a FLU-CLINIC appointment**  After your visit with [place] today you will receive a survey in the mail or online from [employer] regarding your care with [place]. Please take a moment to fill this out. Your feedback is very  important to [place] as you can help [place] better understand your patient needs as well as improve your experience and satisfaction. WE CARE ABOUT YOU!!!     Description   INR 1.5 today - you will take 5 mg tomorrow (Friday- 02/06/19) then resume regular doing of % mg daily except 2.5 on MON and FRI   Follow up 3 weeks with [facility] blood pressures in 2 to 3 weeks from home May consider if it is still running high of increasing Altace to twice daily but we will not do that until we see these readings Wear support stockings as directed Take a whole Coumadin tomorrow only and then resume previous dosing Watch sodium intake Wear support hose as directed Walk regularly We will call with lab work results as soon as those results become available Follow-up with cardiology as planned

## 2019-02-05 NOTE — Progress Notes (Signed)
Subjective:    Patient ID: Dalton Vaughan, male    DOB: [birth year], 85 y.o.   MRN: [id number]  HPI Pt here for follow up and management of chronic medical problems which includes hypothyroid, hyperlipidemia, and a fib. He is taking medication regularly.  The patient today is complaining with some swelling in his ankles feeling more tired and has had his blood pressure more elevated than usual.  The patient's last BMP 2 months ago had a normal creatinine at that time.  The sodium was slightly elevated.  The patient saw his new cardiologist, Dr. Bettina Gavia in December and was told to come back again in 1 year.  The patient says his blood pressures at home are up and down sometimes good sometimes higher.  His wife is currently in the hospital with blood clots in her lungs and multiple fractures of her ribs because of severe osteoporosis.  He is certainly under a lot of stress.  The patient denies any more chest pain than usual.  He says that his heart seems to be more irregular in the mornings.  The shortness of breath has slightly increased and he attributes this to going off the HCTZ and the amlodipine and going on the Lasix.  He is doing okay with his stomach with no swallowing heartburn indigestion nausea vomiting diarrhea blood in the stool or black tarry bowel movements.  He sees the urologist again and June and he checks his prostate because of BPH.  The INR is 1.5 on the right to keep this patient just at 2 or slightly less.  He will take a whole pill tomorrow only and then will resume his previous schedule of a half of a pill on Monday and Friday and a whole pill all the other days.  Patient Active Problem List   Diagnosis Date Noted  . Osteoarthritis of spine with radiculopathy, lumbar region 01/08/2018  . Thyroid disease   . Aortic atherosclerosis ([place]) 05/02/2017  . Thrombocytopenia ([place]) 07/10/2016  . Hereditary and idiopathic peripheral neuropathy 10/16/2015  . Mononeuropathy 10/04/2015  .  Allergic rhinitis 05/06/2014  . Depression 08/03/2013  . Sick sinus syndrome ([place]) 07/31/2012  . Hypertensive heart disease without CHF   . Chronic atrial fibrillation ([place])   . Coronary artery disease involving native coronary artery of native heart with angina pectoris ([place])   . History of TIAs   . Lumbar disc disease   . GERD (gastroesophageal reflux disease)   . Chronic anticoagulation   . BPH (benign prostatic hypertrophy)   . Hyperlipidemia    Outpatient Encounter Medications as of 02/05/2019  Medication Sig  . ALTACE 10 MG capsule TAKE 1 CAPSULE DAILY  . Calcium Carb-Cholecalciferol (CALCIUM + D3) 600-200 MG-UNIT TABS Take 0.25 tablets by mouth daily.  . Cholecalciferol (VITAMIN D-3) 5000 UNITS TABS Take 1 tablet by mouth daily.   Marland Kitchen doxazosin (CARDURA) 8 MG tablet Take 1 tablet (8 mg total) by mouth at bedtime.  Water engineer Bandages & Supports (MEDICAL COMPRESSION SOCKS) MISC 1 each by Does not apply route daily.  . furosemide (LASIX) 40 MG tablet Take 1 tablet (40 mg total) by mouth daily.  . hydrALAZINE (APRESOLINE) 25 MG tablet Take 0.5 tablets (12.5 mg total) by mouth 3 (three) times daily.  Marland Kitchen levothyroxine (SYNTHROID, LEVOTHROID) 50 MCG tablet Take 1 tablet (50 mcg total) by mouth daily.  . mirtazapine (REMERON) 7.5 MG tablet TAKE ONE-HALF (1/2) TABLET AT BEDTIME  . Multiple Vitamin (MULTIVITAMIN) tablet Take 1 tablet by mouth  daily.    . rosuvastatin (CRESTOR) 5 MG tablet TAKE 1 TABLET DAILY AS DIRECTED (Patient taking differently: Takes on Mon, Wed, and Fri)  . vitamin C (ASCORBIC ACID) 500 MG tablet Take 500 mg by mouth daily. Take 1/4 tablet by mouth daily.  Marland Kitchen warfarin (COUMADIN) 5 MG tablet TAKE AS DIRECTED   No facility-administered encounter medications on file as of 02/05/2019.       Review of Systems  Constitutional: Positive for fatigue.  HENT: Negative.   Eyes: Negative.   Respiratory: Negative.   Cardiovascular: Positive for leg swelling (bilateral ankle  swelling ).       Elevated BP   Gastrointestinal: Negative.   Endocrine: Negative.   Genitourinary: Negative.   Musculoskeletal: Negative.   Skin: Negative.   Allergic/Immunologic: Negative.   Neurological: Negative.   Hematological: Negative.   Psychiatric/Behavioral: Negative.        Objective:   Physical Exam Vitals signs and nursing note reviewed.  Constitutional:      General: He is not in acute distress.    Appearance: Normal appearance. He is well-developed and normal weight. He is not ill-appearing.     Comments: Patient is pleasant and quiet and somewhat stressed due to his wife being hospitalized.  HENT:     Head: Normocephalic and atraumatic.     Right Ear: Tympanic membrane, ear canal and external ear normal. There is no impacted cerumen.     Left Ear: Tympanic membrane, ear canal and external ear normal. There is no impacted cerumen.     Nose: Nose normal. No congestion.     Mouth/Throat:     Mouth: Mucous membranes are moist.     Pharynx: No oropharyngeal exudate.  Eyes:     General: No scleral icterus.       Right eye: No discharge.        Left eye: No discharge.     Extraocular Movements: Extraocular movements intact.     Conjunctiva/sclera: Conjunctivae normal.     Pupils: Pupils are equal, round, and reactive to light.  Neck:     Musculoskeletal: Normal range of motion and neck supple.     Thyroid: No thyromegaly.     Vascular: No carotid bruit.     Trachea: No tracheal deviation.  Cardiovascular:     Rate and Rhythm: Normal rate. Rhythm irregular.     Pulses: Normal pulses.     Heart sounds: Normal heart sounds. No murmur.     Comments: Heart is irregular irregular at about 84/min with some pedal edema but good pedal pulses. Pulmonary:     Effort: Pulmonary effort is normal. No respiratory distress.     Breath sounds: Normal breath sounds. No wheezing or rales.     Comments: No axillary adenopathy.  No chest wall tenderness or masses. Chest:      Chest wall: No tenderness.  Abdominal:     General: Abdomen is flat. Bowel sounds are normal.     Palpations: Abdomen is soft. There is no mass.     Tenderness: There is no abdominal tenderness. There is no guarding.  Genitourinary:    Comments: Patient sees urologist in June. Musculoskeletal: Normal range of motion.        General: No tenderness.     Right lower leg: Edema present.     Left lower leg: Edema present.     Comments: The pedal edema is only slight bilaterally.  Lymphadenopathy:     Cervical: No cervical adenopathy.  Skin:    General: Skin is warm and dry.     Findings: No erythema or rash.  Neurological:     Mental Status: He is alert and oriented to person, place, and time. Mental status is at baseline.     Cranial Nerves: No cranial nerve deficit.     Gait: Gait normal.     Deep Tendon Reflexes: Reflexes are normal and symmetric. Reflexes normal.  Psychiatric:        Mood and Affect: Mood normal.        Behavior: Behavior normal.        Thought Content: Thought content normal.        Judgment: Judgment normal.     Comments: Mood affect and behavior are somewhat subdued for this patient but that is normal for this patient.     BP (!) 169/73 (BP Location: Left Arm)   Pulse (!) 58   Temp (!) 96 F (35.6 C) (Oral)   Ht 6' (1.829 m)   Wt 181 lb (82.1 kg)   BMI 24.55 kg/m        Assessment & Plan:  1. Chronic atrial fibrillation (HCC) -Continue with current treatment watch sodium intake and use support hose as directed follow-up with cardiology as planned - CoaguChek XS/INR Waived - CBC with Differential/Platelet  2. Pure hypercholesterolemia -Continue current treatment and follow aggressive therapeutic lifestyle changes - BMP8+EGFR - CBC with Differential/Platelet  3. Aortic atherosclerosis ([place]) -Current treatment and follow-up aggressive therapeutic lifestyle changes - CBC with Differential/Platelet - Lipid panel - Hepatic function panel  4.  Hypothyroidism, unspecified type -Check thyroid profile - CBC with Differential/Platelet  5. Vitamin D deficiency -Continue vitamin D replacement - CBC with Differential/Platelet - VITAMIN D 25 Hydroxy (Vit-D Deficiency, Fractures)  6. Thrombocytopenia (HCC) -No bleeding issues described by patient today or increased bruising. - CBC with Differential/Platelet  7. Chronic anticoagulation -Take a hold Coumadin tomorrow only and then resume previous schedule and recheck in 2 to 3 weeks  Patient Instructions                        Medicare Annual Wellness Visit  [facility] and the medical providers at [facility] strive to bring you the best medical care.  In doing so we not only want to address your current medical conditions and concerns but also to detect new conditions early and prevent illness, disease and health-related problems.    Medicare offers a yearly Wellness Visit which allows our clinical staff to assess your need for preventative services including immunizations, lifestyle education, counseling to decrease risk of preventable diseases and screening for fall risk and other medical concerns.    This visit is provided free of charge (no copay) for all Medicare recipients. The clinical pharmacists at [facility] have begun to conduct these Wellness Visits which will also include a thorough review of all your medications.    As you primary medical provider recommend that you make an appointment for your Annual Wellness Visit if you have not done so already this year.  You may set up this appointment before you leave today or you may call back ([phone]) and schedule an appointment.  Please make sure when you call that you mention that you are scheduling your Annual Wellness Visit with the clinical pharmacist so that the appointment may be made for the proper length of time.     Continue current medications. Continue good therapeutic  lifestyle changes which include good diet and exercise. Fall precautions discussed with patient. If an FOBT was given today- please return it to our front desk. If you are over 57 years old - you may need Prevnar 66 or the adult Pneumonia vaccine.  **Flu shots are available--- please call and schedule a FLU-CLINIC appointment**  After your visit with [place] today you will receive a survey in the mail or online from [employer] regarding your care with [place]. Please take a moment to fill this out. Your feedback is very important to [place] as you can help [place] better understand your patient needs as well as improve your experience and satisfaction. WE CARE ABOUT YOU!!!     Description   INR 1.5 today - you will take 5 mg tomorrow (Friday- 02/06/19) then resume regular doing of % mg daily except 2.5 on MON and FRI   Follow up 3 weeks with [facility] blood pressures in 2 to 3 weeks from home May consider if it is still running high of increasing Altace to twice daily but we will not do that until we see these readings Wear support stockings as directed Take a whole Coumadin tomorrow only and then resume previous dosing Watch sodium intake Wear support hose as directed Walk regularly We will call with lab work results as soon as those results become available Follow-up with cardiology as planned  Arrie Senate MD

## 2019-02-06 LAB — BMP8+EGFR
BUN/Creatinine Ratio: 10 (ref 10–24)
BUN: 9 mg/dL (ref 8–27)
CO2: 28 mmol/L (ref 20–29)
Calcium: 9.3 mg/dL (ref 8.6–10.2)
Chloride: 102 mmol/L (ref 96–106)
Creatinine, Ser: 0.94 mg/dL (ref 0.76–1.27)
GFR calc Af Amer: 84 mL/min/{1.73_m2} (ref >59)
GFR calc non Af Amer: 73 mL/min/{1.73_m2} (ref >59)
Glucose: 120 mg/dL — ABNORMAL HIGH (ref 65–99)
Potassium: 3.6 mmol/L (ref 3.5–5.2)
Sodium: 147 mmol/L — ABNORMAL HIGH (ref 134–144)

## 2019-02-06 LAB — CBC WITH DIFFERENTIAL/PLATELET
Basophils Absolute: 0 10*3/uL (ref 0.0–0.2)
Basos: 0 %
EOS (ABSOLUTE): 0.1 10*3/uL (ref 0.0–0.4)
Eos: 2 %
Hematocrit: 38.4 % (ref 37.5–51.0)
Hemoglobin: 12.7 g/dL — ABNORMAL LOW (ref 13.0–17.7)
Immature Grans (Abs): 0 10*3/uL (ref 0.0–0.1)
Immature Granulocytes: 0 %
Lymphocytes Absolute: 2.2 10*3/uL (ref 0.7–3.1)
Lymphs: 39 %
MCH: 29.3 pg (ref 26.6–33.0)
MCHC: 33.1 g/dL (ref 31.5–35.7)
MCV: 89 fL (ref 79–97)
Monocytes Absolute: 0.4 10*3/uL (ref 0.1–0.9)
Monocytes: 7 %
Neutrophils Absolute: 3 10*3/uL (ref 1.4–7.0)
Neutrophils: 52 %
Platelets: 163 10*3/uL (ref 150–450)
RBC: 4.33 x10E6/uL (ref 4.14–5.80)
RDW: 14.7 % (ref 11.6–15.4)
WBC: 5.8 10*3/uL (ref 3.4–10.8)

## 2019-02-06 LAB — HEPATIC FUNCTION PANEL
ALT: 13 IU/L (ref 0–44)
AST: 20 IU/L (ref 0–40)
Albumin: 4.4 g/dL (ref 3.6–4.6)
Alkaline Phosphatase: 99 IU/L (ref 39–117)
Bilirubin Total: 0.6 mg/dL (ref 0.0–1.2)
Bilirubin, Direct: 0.2 mg/dL (ref 0.00–0.40)
Total Protein: 6.5 g/dL (ref 6.0–8.5)

## 2019-02-06 LAB — LIPID PANEL
Chol/HDL Ratio: 3.8 ratio (ref 0.0–5.0)
Cholesterol, Total: 166 mg/dL (ref 100–199)
HDL: 44 mg/dL (ref >39)
LDL Calculated: 108 mg/dL — ABNORMAL HIGH (ref 0–99)
Triglycerides: 68 mg/dL (ref 0–149)
VLDL Cholesterol Cal: 14 mg/dL (ref 5–40)

## 2019-02-06 LAB — THYROID PANEL WITH TSH
Free Thyroxine Index: 2.4 (ref 1.2–4.9)
T3 Uptake Ratio: 31 % (ref 24–39)
T4, Total: 7.9 ug/dL (ref 4.5–12.0)
TSH: 3.45 u[IU]/mL (ref 0.450–4.500)

## 2019-02-06 LAB — VITAMIN D 25 HYDROXY (VIT D DEFICIENCY, FRACTURES): Vit D, 25-Hydroxy: 95.9 ng/mL (ref 30.0–100.0)

## 2019-02-09 LAB — HGB A1C W/O EAG: Hgb A1c MFr Bld: 5.5 % (ref 4.8–5.6)

## 2019-02-09 LAB — SPECIMEN STATUS REPORT

## 2019-02-25 ENCOUNTER — Telehealth: Payer: Self-pay | Admitting: Family Medicine

## 2019-02-25 ENCOUNTER — Other Ambulatory Visit: Payer: Self-pay

## 2019-02-25 ENCOUNTER — Ambulatory Visit: Payer: Medicare Other | Admitting: Pharmacist Clinician (PhC)/ Clinical Pharmacy Specialist

## 2019-02-25 DIAGNOSIS — Z7901 Long term (current) use of anticoagulants: Secondary | ICD-10-CM

## 2019-02-25 DIAGNOSIS — I482 Chronic atrial fibrillation, unspecified: Secondary | ICD-10-CM | POA: Diagnosis not present

## 2019-02-25 LAB — COAGUCHEK XS/INR WAIVED
INR: 1.4 — ABNORMAL HIGH (ref 0.9–1.1)
Prothrombin Time: 17.1 s

## 2019-02-25 MED ORDER — AMLODIPINE BESYLATE 2.5 MG PO TABS: 2.5000 mg | ORAL_TABLET | Freq: Every day | ORAL | 3 refills | Status: DC

## 2019-02-25 NOTE — Telephone Encounter (Signed)
Discussed notes with pt from [facility] today. Will scan in BP records

## 2019-02-25 NOTE — Patient Instructions (Signed)
Description   Change warfarin to taking 39m (1 tablet) every day of the week  INR today 1.5 (goal 2-2.5)  Too thick today

## 2019-03-11 ENCOUNTER — Encounter: Payer: Self-pay | Admitting: Pharmacist Clinician (PhC)/ Clinical Pharmacy Specialist

## 2019-03-11 ENCOUNTER — Ambulatory Visit: Payer: Medicare Other | Admitting: Pharmacist Clinician (PhC)/ Clinical Pharmacy Specialist

## 2019-03-11 ENCOUNTER — Other Ambulatory Visit: Payer: Self-pay

## 2019-03-11 DIAGNOSIS — Z7901 Long term (current) use of anticoagulants: Secondary | ICD-10-CM | POA: Diagnosis not present

## 2019-03-11 DIAGNOSIS — I482 Chronic atrial fibrillation, unspecified: Secondary | ICD-10-CM | POA: Diagnosis not present

## 2019-03-11 LAB — COAGUCHEK XS/INR WAIVED
INR: 2.7 — ABNORMAL HIGH (ref 0.9–1.1)
Prothrombin Time: 32.2 s

## 2019-03-11 NOTE — Patient Instructions (Signed)
Description   Change warfarin to taking 54m (1 tablet) every day of the week except for Fridays 1/2 tablet  INR today 2.7 (goal 2-2.5)

## 2019-04-22 ENCOUNTER — Encounter: Payer: Self-pay | Admitting: Pharmacist Clinician (PhC)/ Clinical Pharmacy Specialist

## 2019-04-28 ENCOUNTER — Other Ambulatory Visit: Payer: Self-pay

## 2019-04-29 ENCOUNTER — Ambulatory Visit (INDEPENDENT_AMBULATORY_CARE_PROVIDER_SITE_OTHER): Payer: Medicare Other | Admitting: Pharmacist Clinician (PhC)/ Clinical Pharmacy Specialist

## 2019-04-29 ENCOUNTER — Encounter: Payer: Self-pay | Admitting: Pharmacist Clinician (PhC)/ Clinical Pharmacy Specialist

## 2019-04-29 DIAGNOSIS — Z7901 Long term (current) use of anticoagulants: Secondary | ICD-10-CM | POA: Diagnosis not present

## 2019-04-29 DIAGNOSIS — I482 Chronic atrial fibrillation, unspecified: Secondary | ICD-10-CM

## 2019-04-29 LAB — COAGUCHEK XS/INR WAIVED
INR: 1.2 — ABNORMAL HIGH (ref 0.9–1.1)
Prothrombin Time: 14.1 s

## 2019-04-29 NOTE — Patient Instructions (Signed)
Description   Take 1 1/2 tablets today and tomorrow and Friday take 1 tablet then Saturday go back to taking 68m (1 tablet) every day of the week except for Fridays 1/2 tablet  INR today 1.2  (goal 2-2.5)  Too thick today

## 2019-05-03 ENCOUNTER — Other Ambulatory Visit: Payer: Self-pay | Admitting: Family Medicine

## 2019-05-05 ENCOUNTER — Other Ambulatory Visit: Payer: Self-pay

## 2019-05-06 ENCOUNTER — Ambulatory Visit (INDEPENDENT_AMBULATORY_CARE_PROVIDER_SITE_OTHER): Payer: Medicare Other | Admitting: Pharmacist Clinician (PhC)/ Clinical Pharmacy Specialist

## 2019-05-06 ENCOUNTER — Encounter: Payer: Self-pay | Admitting: Pharmacist Clinician (PhC)/ Clinical Pharmacy Specialist

## 2019-05-06 DIAGNOSIS — Z23 Encounter for immunization: Secondary | ICD-10-CM

## 2019-05-06 DIAGNOSIS — I482 Chronic atrial fibrillation, unspecified: Secondary | ICD-10-CM | POA: Diagnosis not present

## 2019-05-06 DIAGNOSIS — Z7901 Long term (current) use of anticoagulants: Secondary | ICD-10-CM

## 2019-05-06 LAB — COAGUCHEK XS/INR WAIVED
INR: 1.8 — ABNORMAL HIGH (ref 0.9–1.1)
Prothrombin Time: 21.3 s

## 2019-05-06 MED ORDER — AMLODIPINE BESYLATE 5 MG PO TABS: 5.0000 mg | ORAL_TABLET | Freq: Every day | ORAL | 3 refills | Status: DC

## 2019-05-06 MED ORDER — ROSUVASTATIN CALCIUM 5 MG PO TABS: 5.0000 mg | ORAL_TABLET | Freq: Every day | ORAL | 0 refills | Status: DC

## 2019-05-06 MED ORDER — ROSUVASTATIN CALCIUM 5 MG PO TABS: 5.0000 mg | ORAL_TABLET | Freq: Every day | ORAL | 3 refills | Status: DC

## 2019-05-06 MED ORDER — DOXAZOSIN MESYLATE 8 MG PO TABS: 8.0000 mg | ORAL_TABLET | Freq: Every day | ORAL | 3 refills | Status: DC

## 2019-05-06 NOTE — Progress Notes (Signed)
Pt given 2nd Shingrix vaccine Tolerated well

## 2019-05-06 NOTE — Patient Instructions (Signed)
Description   Change to taking 1 tablet (65m) every day of the week.  INR today 1.8  (goal 2-2.5)  Too thick today

## 2019-06-13 ENCOUNTER — Other Ambulatory Visit: Payer: Self-pay | Admitting: Family Medicine

## 2019-06-16 ENCOUNTER — Other Ambulatory Visit: Payer: Self-pay

## 2019-06-17 ENCOUNTER — Ambulatory Visit (INDEPENDENT_AMBULATORY_CARE_PROVIDER_SITE_OTHER): Payer: Medicare Other | Admitting: Family Medicine

## 2019-06-17 ENCOUNTER — Ambulatory Visit (INDEPENDENT_AMBULATORY_CARE_PROVIDER_SITE_OTHER): Payer: Medicare Other | Admitting: Pharmacist Clinician (PhC)/ Clinical Pharmacy Specialist

## 2019-06-17 ENCOUNTER — Other Ambulatory Visit: Payer: Self-pay | Admitting: *Deleted

## 2019-06-17 ENCOUNTER — Encounter: Payer: Self-pay | Admitting: Pharmacist Clinician (PhC)/ Clinical Pharmacy Specialist

## 2019-06-17 ENCOUNTER — Encounter: Payer: Self-pay | Admitting: Family Medicine

## 2019-06-17 DIAGNOSIS — I700 Atherosclerosis of aorta: Secondary | ICD-10-CM | POA: Diagnosis not present

## 2019-06-17 DIAGNOSIS — E559 Vitamin D deficiency, unspecified: Secondary | ICD-10-CM | POA: Diagnosis not present

## 2019-06-17 DIAGNOSIS — I482 Chronic atrial fibrillation, unspecified: Secondary | ICD-10-CM

## 2019-06-17 DIAGNOSIS — Z7901 Long term (current) use of anticoagulants: Secondary | ICD-10-CM

## 2019-06-17 DIAGNOSIS — I25119 Atherosclerotic heart disease of native coronary artery with unspecified angina pectoris: Secondary | ICD-10-CM

## 2019-06-17 DIAGNOSIS — E079 Disorder of thyroid, unspecified: Secondary | ICD-10-CM | POA: Diagnosis not present

## 2019-06-17 DIAGNOSIS — M4726 Other spondylosis with radiculopathy, lumbar region: Secondary | ICD-10-CM | POA: Diagnosis not present

## 2019-06-17 DIAGNOSIS — D696 Thrombocytopenia, unspecified: Secondary | ICD-10-CM

## 2019-06-17 DIAGNOSIS — E782 Mixed hyperlipidemia: Secondary | ICD-10-CM

## 2019-06-17 DIAGNOSIS — E039 Hypothyroidism, unspecified: Secondary | ICD-10-CM

## 2019-06-17 DIAGNOSIS — M519 Unspecified thoracic, thoracolumbar and lumbosacral intervertebral disc disorder: Secondary | ICD-10-CM | POA: Diagnosis not present

## 2019-06-17 DIAGNOSIS — G609 Hereditary and idiopathic neuropathy, unspecified: Secondary | ICD-10-CM | POA: Diagnosis not present

## 2019-06-17 DIAGNOSIS — K219 Gastro-esophageal reflux disease without esophagitis: Secondary | ICD-10-CM

## 2019-06-17 LAB — COAGUCHEK XS/INR WAIVED
INR: 2.9 — ABNORMAL HIGH (ref 0.9–1.1)
Prothrombin Time: 34.3 s

## 2019-06-17 NOTE — Progress Notes (Signed)
Virtual Visit Via telephone Note I connected with@ on 06/17/19 by telephone and verified that I am speaking with the correct person or authorized healthcare agent using two identifiers. Dalton Vaughan is currently located at home and there are no unauthorized people in close proximity. I completed this visit while in a private location in my home .  This visit type was conducted due to national recommendations for restrictions regarding the COVID-19 Pandemic (e.g. social distancing).  This format is felt to be most appropriate for this patient at this time.  All issues noted in this document were discussed and addressed.  No physical exam was performed.    I discussed the limitations, risks, security and privacy concerns of performing an evaluation and management service by telephone and the availability of in person appointments. I also discussed with the patient that there may be a patient responsible charge related to this service. The patient expressed understanding and agreed to proceed.   Date:  06/17/2019    ID:  Dalton Vaughan      02-Apr-1929        [id number]   Patient Care Team Patient Care Team: Chipper Herb, MD as PCP - General (Family Medicine) Jacolyn Reedy, MD as Consulting Physician (Cardiology) Deboraha Sprang, MD (Cardiology) Raynelle Bring, MD as Consulting Physician (Urology) Pyrtle, Lajuan Lines, MD as Consulting Physician (Gastroenterology) Clent Jacks, MD as Consulting Physician (Ophthalmology)  Reason for Visit: Primary Care Follow-up     History of Present Illness & Review of Systems:     Dalton Vaughan is a 85 y.o. year old male primary care patient that presents today for a telehealth visit.  The patient is pleasant and alert and calm.  He says that his blood pressures typically run around 142/81 that number.  His weight is 172.  Today he denies any chest pain pressure tightness or shortness of breath anymore than usual.  He has no trouble with swallowing but  does have occasional indigestion.  He denies any nausea vomiting diarrhea blood in the stool or black tarry bowel movements.  He is passing his water well though frequently and does have arrangements to see Dr. Alinda Money, his urologist on July 24.  Review of systems as stated, otherwise negative.  The patient does not have symptoms concerning for COVID-19 infection (fever, chills, cough, or new shortness of breath).      Current Medications (Verified) Allergies as of 06/17/2019      Reactions   Paxil [paroxetine Hcl] Palpitations   Eliquis [apixaban] Other (See Comments)   dizziness   Penicillins    Said he is  Allergic" Natural combinations-extended release   Pravachol [pravastatin Sodium]    Zetia [ezetimibe]    myaalgias   Vytorin [ezetimibe-simvastatin] Other (See Comments)      Medication List       Accurate as of June 17, 2019  9:09 AM. If you have any questions, ask your nurse or doctor.        Altace 10 MG capsule Generic drug: ramipril TAKE 1 CAPSULE DAILY   amLODipine 5 MG tablet Commonly known as: NORVASC Take 1 tablet (5 mg total) by mouth daily.   Calcium + D3 600-200 MG-UNIT Tabs Take 0.25 tablets by mouth daily.   doxazosin 8 MG tablet Commonly known as: CARDURA Take 1 tablet (8 mg total) by mouth at bedtime.   furosemide 40 MG tablet Commonly known as: LASIX Take 1 tablet (40 mg total) by mouth daily.  hydrALAZINE 25 MG tablet Commonly known as: APRESOLINE Take 0.5 tablets (12.5 mg total) by mouth 3 (three) times daily.   levothyroxine 50 MCG tablet Commonly known as: SYNTHROID Take 1 tablet (50 mcg total) by mouth daily.   Medical Compression Socks Misc 1 each by Does not apply route daily.   mirtazapine 7.5 MG tablet Commonly known as: REMERON TAKE ONE-HALF (1/2) TABLET AT BEDTIME   multivitamin tablet Take 1 tablet by mouth daily.   rosuvastatin 5 MG tablet Commonly known as: CRESTOR Take 1 tablet (5 mg total) by mouth daily. as  directed   vitamin C 500 MG tablet Commonly known as: ASCORBIC ACID Take 500 mg by mouth daily. Take 1/4 tablet by mouth daily.   Vitamin D-3 125 MCG (5000 UT) Tabs Take 1 tablet by mouth daily.   warfarin 5 MG tablet Commonly known as: COUMADIN Take as directed by the anticoagulation clinic. If you are unsure how to take this medication, talk to your nurse or doctor. Original instructions: TAKE AS DIRECTED           Allergies (Verified)    Paxil [paroxetine hcl], Eliquis [apixaban], Penicillins, Pravachol [pravastatin sodium], Zetia [ezetimibe], and Vytorin [ezetimibe-simvastatin]  Past Medical History Past Medical History:  Diagnosis Date  . Anal fissure   . Atrial fibrillation ([place])   . BPH (benign prostatic hypertrophy)   . CAD (coronary artery disease)    Dr. Wynonia Lawman  . Cataract   . Depression 2004  . Diverticulosis   . GERD (gastroesophageal reflux disease)   . History of TIAs   . Hyperlipidemia   . Hypertension   . Hypertensive heart disease without CHF   . Hypothyroidism   . Internal hemorrhoids   . Lumbar disc disease   . Oral bleeding 08/20/2012   Following molar tooth extraction July, 2013  . Ruptured disk 1995  . Shingles   . Thyroid disease   . Vitamin D deficiency   . Vitreous detachment      Past Surgical History:  Procedure Laterality Date  . bilateral cataract surgery  6/07  . CARDIAC CATHETERIZATION    . CORONARY ARTERY BYPASS GRAFT  12/99    Dr. Servando Snare   . cysloscopy  8/08  . EYE SURGERY    . HERNIA REPAIR  1997   right  . herninated disc  1995  . PROSTATE BIOPSY  8/08  . TONSILLECTOMY AND ADENOIDECTOMY      Social History   Socioeconomic History  . Marital status: Married    Spouse name: [name]   . Number of children: 1  . Years of education: 68  . Highest education level: Not on file  Occupational History  . Occupation: Retired    Comment: Social research officer, government x 27 years  . 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 Financial resource strain: Not hard at all  . Food insecurity    Worry: Never true    Inability: Never true  . Transportation needs    Medical: No    Non-medical: No  Tobacco Use  . Smoking status: Former Smoker    Packs/day: 1.00    Years: 18.00    Pack years: 18.00    Types: Cigarettes    Start date: 12/17/1948    Quit date: 12/17/1966    Years since quitting: 52.5  . Smokeless tobacco: Never Used  Substance and Sexual Activity  . Alcohol use: Yes  Alcohol/week: 1.0 standard drinks    Types: 1 Standard drinks or equivalent per week    Comment: Occasionally  . Drug use: No  . Sexual activity: Not on file  Lifestyle  . Physical activity    Days per week: 5 days    Minutes per session: 50 min  . Stress: To some extent  Relationships  . Social connections    Talks on phone: More than three times a week    Gets together: More than three times a week    Attends religious service: Never    Active member of club or organization: No    Attends meetings of clubs or organizations: Never    Relationship status: Married  Other Topics Concern  . Not on file  Social History Narrative   Married.  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 Dementia Mother   . Arthritis Mother   . Cancer Brother        prostate  . Asthma Sister   . Heart disease Paternal Aunt   . Heart disease Paternal Uncle   . Hypertension Maternal Grandmother   . CVA Maternal Grandmother   . CVA Paternal Grandmother   . Hepatitis C Son   . Arthritis Son   . Colon cancer Neg Hx   . Colon polyps Neg Hx   . Kidney disease Neg Hx   . Esophageal cancer Neg Hx   . Gallbladder disease Neg Hx   . Diabetes Neg Hx       Labs/Other Tests and Data Reviewed:    Wt Readings from Last 3 Encounters:  02/05/19 181 lb (82.1 kg)  01/26/19 186  lb (84.4 kg)  12/11/18 187 lb 1.9 oz (84.9 kg)   Temp Readings from Last 3 Encounters:  02/05/19 (!) 96 F (35.6 C) (Oral)  12/01/18 (!) 96.8 F (36 C) (Oral)  11/25/18 (!) 97 F (36.1 C) (Oral)   BP Readings from Last 3 Encounters:  03/11/19 (!) 158/83  02/05/19 (!) 169/73  01/26/19 (!) 188/75   Pulse Readings from Last 3 Encounters:  02/05/19 (!) 58  01/26/19 95  12/11/18 93     Lab Results  Component Value Date   HGBA1C 5.5 02/05/2019   HGBA1C 5.9 (H) 10/13/2015   Lab Results  Component Value Date   LDLCALC 108 (H) 02/05/2019   CREATININE 0.94 02/05/2019       Chemistry      Component Value Date/Time   NA 147 (H) 02/05/2019 0946   K 3.6 02/05/2019 0946   CL 102 02/05/2019 0946   CO2 28 02/05/2019 0946   BUN 9 02/05/2019 0946   CREATININE 0.94 02/05/2019 0946   CREATININE 0.74 04/22/2013 1156      Component Value Date/Time   CALCIUM 9.3 02/05/2019 0946   ALKPHOS 99 02/05/2019 0946   AST 20 02/05/2019 0946   ALT 13 02/05/2019 0946   BILITOT 0.6 02/05/2019 0946         OBSERVATIONS/ OBJECTIVE:     The patient had his INR checked today and it was a little on the thin side.  We have had problems with him having bleeding issues and we have always kept it around 2 or less.  It was 2.9 today and he was told by the clinical pharmacist to eat some greens and  we will try to make arrangements to have this rechecked again in a couple of weeks at which time we will also do his blood work.  He says his blood pressure at home runs around 142/81.  His weight is 172.  His pulse rate is between 65 and 70.  As mentioned earlier his INR was 2.9 and this needs to be closer to the 2 range for him.  He does have a visit with the urologist on July 24.  Physical exam deferred due to nature of telephonic visit.  ASSESSMENT & PLAN    Time:   Today, I have spent 26 minutes with the patient via telephone discussing the above including Covid precautions.     Visit Diagnoses:  1. Chronic atrial fibrillation (HCC) -The INR today was slightly on the thin side and he will eat some greens and lower his dose as discussed by the clinical pharmacist and repeat the INR in a couple weeks and get blood work at the same time.  2. Aortic atherosclerosis ([place]) -Continue with aggressive therapeutic lifestyle changes, Crestor, and follow-up visit with Dr. Bettina Gavia in December, his cardiologist.  3. Coronary artery disease involving native coronary artery of native heart with angina pectoris ([place]) -Continue statin therapy and follow-up with cardiologist as planned -Continue to get protimes regularly  4. Gastroesophageal reflux disease, esophagitis presence not specified -Patient has had some increased indigestion and will go ahead and take Pepcid AC twice daily before breakfast and supper for 1 month and then if he is doing better we will reduce this to once daily before breakfast for another month then as needed.  5. Thyroid disease -Continue with current treatment pending results of lab work  6. Hereditary and idiopathic peripheral neuropathy -No complaints today with neuropathy  7. Osteoarthritis of spine with radiculopathy, lumbar region -Continue to walk regularly and be careful not to put self at risk for falling and take Tylenol if needed for pain  8. Lumbar disc disease -Walk regularly take Tylenol for pain  9. Thrombocytopenia ([place]) -No complaints today with increased bruising  10. Mixed hyperlipidemia -Continue with statin therapy  11. Hypothyroidism, unspecified type -Continue with thyroid therapy pending results of lab work  12. Vitamin D deficiency -Continue with vitamin D replacement pending results of lab work  Patient Instructions  Continue to get protimes as recommended by clinical pharmacy Remember to keep INR is closer to 2 and not much higher than that. Stay active physically as much as possible and drinking plenty of water and fluids and stay  well-hydrated the summer Continue to be careful and not put self at risk for falling Follow-up with cardiology as planned Continue to practice good hand and respiratory hygiene with the [place] pandemic we will plan to get lab work done at the time of your next INR visit. Return to see primary care physician at that time.      The above assessment and management plan was discussed with the patient. The patient verbalized understanding of and has agreed to the management plan. Patient is aware to call the clinic if symptoms persist or worsen. Patient is aware when to return to the clinic for a follow-up visit. Patient educated on when it is appropriate to go to the emergency department.    Chipper Herb, MD [address], [place], [place] [address] Ph [phone]   Arrie Senate MD

## 2019-06-17 NOTE — Patient Instructions (Signed)
Description   Change to taking 1 tablet (5m) every day of the week except for Fridays 1/2 tablet  INR today 2.9  (goal 2-2.5)  A little thin today  Eat a high vitamin K food today

## 2019-06-17 NOTE — Patient Instructions (Addendum)
Continue to get protimes as recommended by clinical pharmacy Remember to keep INR is closer to 2 and not much higher than that. Stay active physically as much as possible and drinking plenty of water and fluids and stay well-hydrated this summer Continue to be careful and not put self at risk for falling Follow-up with cardiology as planned in December with Dr. Bettina Gavia Continue to practice good hand and respiratory hygiene with the [place] pandemic  we will plan to get lab work done at the time of your next INR visit. Return to see primary care physician at that time or as can be scheduled. Because of increased indigestion, take Pepcid AC twice daily before breakfast and supper for 1 month and then once daily before breakfast for another month and then as needed after that.

## 2019-06-24 ENCOUNTER — Other Ambulatory Visit: Payer: Self-pay | Admitting: Family Medicine

## 2019-06-25 ENCOUNTER — Other Ambulatory Visit: Payer: Self-pay | Admitting: Pharmacist Clinician (PhC)/ Clinical Pharmacy Specialist

## 2019-07-16 ENCOUNTER — Telehealth: Payer: Self-pay | Admitting: Family Medicine

## 2019-07-16 NOTE — Telephone Encounter (Signed)
Patient states he has an infection in his eye. Telephone visit made for tomorrow. Patient verbalized understanding

## 2019-07-17 ENCOUNTER — Ambulatory Visit: Payer: Medicare Other | Admitting: Nurse Practitioner

## 2019-07-17 DIAGNOSIS — H02831 Dermatochalasis of right upper eyelid: Secondary | ICD-10-CM | POA: Diagnosis not present

## 2019-07-17 DIAGNOSIS — H16223 Keratoconjunctivitis sicca, not specified as Sjogren's, bilateral: Secondary | ICD-10-CM | POA: Diagnosis not present

## 2019-07-17 DIAGNOSIS — H1045 Other chronic allergic conjunctivitis: Secondary | ICD-10-CM | POA: Diagnosis not present

## 2019-07-17 DIAGNOSIS — H26492 Other secondary cataract, left eye: Secondary | ICD-10-CM | POA: Diagnosis not present

## 2019-07-17 DIAGNOSIS — H02834 Dermatochalasis of left upper eyelid: Secondary | ICD-10-CM | POA: Diagnosis not present

## 2019-07-27 ENCOUNTER — Other Ambulatory Visit: Payer: Self-pay

## 2019-07-27 DIAGNOSIS — R972 Elevated prostate specific antigen [PSA]: Secondary | ICD-10-CM | POA: Diagnosis not present

## 2019-07-27 DIAGNOSIS — R350 Frequency of micturition: Secondary | ICD-10-CM | POA: Diagnosis not present

## 2019-07-27 DIAGNOSIS — N401 Enlarged prostate with lower urinary tract symptoms: Secondary | ICD-10-CM | POA: Diagnosis not present

## 2019-07-28 ENCOUNTER — Encounter: Payer: Self-pay | Admitting: Family Medicine

## 2019-07-28 ENCOUNTER — Ambulatory Visit (INDEPENDENT_AMBULATORY_CARE_PROVIDER_SITE_OTHER): Payer: Medicare Other | Admitting: Family Medicine

## 2019-07-28 VITALS — BP 126/64 | HR 62 | Temp 98.4°F | Ht 72.0 in | Wt 176.0 lb

## 2019-07-28 DIAGNOSIS — R351 Nocturia: Secondary | ICD-10-CM | POA: Diagnosis not present

## 2019-07-28 DIAGNOSIS — N401 Enlarged prostate with lower urinary tract symptoms: Secondary | ICD-10-CM

## 2019-07-28 DIAGNOSIS — F325 Major depressive disorder, single episode, in full remission: Secondary | ICD-10-CM

## 2019-07-28 DIAGNOSIS — I482 Chronic atrial fibrillation, unspecified: Secondary | ICD-10-CM

## 2019-07-28 DIAGNOSIS — I700 Atherosclerosis of aorta: Secondary | ICD-10-CM

## 2019-07-28 DIAGNOSIS — E039 Hypothyroidism, unspecified: Secondary | ICD-10-CM | POA: Diagnosis not present

## 2019-07-28 DIAGNOSIS — Z7901 Long term (current) use of anticoagulants: Secondary | ICD-10-CM

## 2019-07-28 DIAGNOSIS — D696 Thrombocytopenia, unspecified: Secondary | ICD-10-CM

## 2019-07-28 LAB — COAGUCHEK XS/INR WAIVED
INR: 2.4 — ABNORMAL HIGH (ref 0.9–1.1)
Prothrombin Time: 29.2 s

## 2019-07-28 MED ORDER — MIRTAZAPINE 7.5 MG PO TABS: ORAL_TABLET | ORAL | 3 refills | Status: DC

## 2019-07-28 MED ORDER — RAMIPRIL 10 MG PO CAPS: 10.0000 mg | ORAL_CAPSULE | Freq: Every day | ORAL | 1 refills | Status: DC

## 2019-07-28 NOTE — Progress Notes (Signed)
Subjective: CC: afib, HTN PCP: [name], DO HPI:Dalton Vaughan is a 85 y.o. male presenting to clinic today for:  1.  Atrial fibrillation/hypertension Patient reports compliance with Altace 10 mg daily, Norvasc 5 mg daily, triamterene/hydrochlorothiazide and warfarin 5 mg daily (except for Fridays when he takes 2.5 mg).  He denies any heart palpitations, chest pain, shortness of breath, dizziness, melena, hematochezia, hematuria or abnormal bleeding.  2.  BPH Patient reports history of BPH, treated with doxazosin.  He continues to have nocturia.  He had a PSA performed at his urology visit yesterday and was told that this was decreasing.  He sees Dr. Alinda Money with [facility].  3.  Hyperlipidemia Patient reports compliance with Crestor 5 mg daily.  Again no chest pain or shortness of breath.  4.  Hypothyroidism Patient reports compliance with Synthroid 50 mcg daily.  He notes that he was diagnosed greater than 10 years ago.  No history of surgery or radiation to the neck.  No known family history of thyroid disease.  He denies any change in weight, change in voice, difficulty swallowing.  5.  Depression Patient reports history of major depressive disorder.  He has been treated with mirtazapine for a while now.  He notes he is to be on triple therapy but was able to wean down to 7.5 mg of mirtazapine.  He has tried tapering off of this medication in the past but subsequently developed sensation changes in his fingers and therefore resumed use.  He reports good control of symptoms.    ROS: Per HPI  Allergies  Allergen Reactions  . Paxil [Paroxetine Hcl] Palpitations  . Eliquis [Apixaban] Other (See Comments)    dizziness  . Penicillins     Said he is  Allergic" Natural combinations-extended release  . Pravachol [Pravastatin Sodium]   . Zetia [Ezetimibe]     myaalgias  . Vytorin [Ezetimibe-Simvastatin] Other (See Comments)   Past Medical History:  Diagnosis Date  .  Anal fissure   . Atrial fibrillation ([place])   . BPH (benign prostatic hypertrophy)   . CAD (coronary artery disease)    Dr. Wynonia Lawman  . Cataract   . Depression 2004  . Diverticulosis   . GERD (gastroesophageal reflux disease)   . History of TIAs   . Hyperlipidemia   . Hypertension   . Hypertensive heart disease without CHF   . Hypothyroidism   . Internal hemorrhoids   . Lumbar disc disease   . Oral bleeding 08/20/2012   Following molar tooth extraction July, 2013  . Ruptured disk 1995  . Shingles   . Thyroid disease   . Vitamin D deficiency   . Vitreous detachment     Current Outpatient Medications:  .  ALTACE 10 MG capsule, TAKE 1 CAPSULE DAILY, Disp: 90 capsule, Rfl: 0 .  amLODipine (NORVASC) 5 MG tablet, Take 1 tablet (5 mg total) by mouth daily., Disp: 90 tablet, Rfl: 3 .  Calcium Carb-Cholecalciferol (CALCIUM + D3) 600-200 MG-UNIT TABS, Take 0.25 tablets by mouth daily., Disp: , Rfl:  .  Cholecalciferol (VITAMIN D-3) 5000 UNITS TABS, Take 1 tablet by mouth daily. , Disp: , Rfl:  .  doxazosin (CARDURA) 8 MG tablet, Take 1 tablet (8 mg total) by mouth at bedtime., Disp: 90 tablet, Rfl: 3 .  furosemide (LASIX) 40 MG tablet, Take 1 tablet (40 mg total) by mouth daily., Disp: 30 tablet, Rfl: 3 .  levothyroxine (SYNTHROID) 50 MCG tablet, TAKE 1 TABLET DAILY, Disp: 90 tablet,  Rfl: 1 .  mirtazapine (REMERON) 7.5 MG tablet, TAKE ONE-HALF (1/2) TABLET AT BEDTIME, Disp: 45 tablet, Rfl: 0 .  Multiple Vitamin (MULTIVITAMIN) tablet, Take 1 tablet by mouth daily.  , Disp: , Rfl:  .  rosuvastatin (CRESTOR) 5 MG tablet, Take 1 tablet (5 mg total) by mouth daily. as directed, Disp: 90 tablet, Rfl: 3 .  vitamin C (ASCORBIC ACID) 500 MG tablet, Take 500 mg by mouth daily. Take 1/4 tablet by mouth daily., Disp: , Rfl:  .  warfarin (COUMADIN) 5 MG tablet, TAKE AS DIRECTED, Disp: 90 tablet, Rfl: 3 .  Elastic Bandages & Supports (MEDICAL COMPRESSION SOCKS) MISC, 1 each by Does not apply route daily.  (Patient not taking: Reported on 07/28/2019), Disp: 1 each, Rfl: 2 .  olopatadine (PATANOL) 0.1 % ophthalmic solution, , Disp: , Rfl:  .  triamterene-hydrochlorothiazide (MAXZIDE-25) 37.5-25 MG tablet, Take 1 tablet by mouth daily., Disp: , Rfl:  .  XIIDRA 5 % SOLN, , Disp: , Rfl:  Social History   Socioeconomic History  . Marital status: Married    Spouse name: Dalton Vaughan   . Number of children: 1  . Years of education: 51  . Highest education level: Not on file  Occupational History  . Occupation: Retired    Comment: Social research officer, government x 27 years  . 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 Financial resource strain: Not hard at all  . Food insecurity    Worry: Never true    Inability: Never true  . Transportation needs    Medical: No    Non-medical: No  Tobacco Use  . Smoking status: Former Smoker    Packs/day: 1.00    Years: 18.00    Pack years: 18.00    Types: Cigarettes    Start date: 12/17/1948    Quit date: 12/17/1966    Years since quitting: 52.6  . Smokeless tobacco: Never Used  Substance and Sexual Activity  . Alcohol use: Yes    Alcohol/week: 1.0 standard drinks    Types: 1 Standard drinks or equivalent per week    Comment: Occasionally  . Drug use: No  . Sexual activity: Not on file  Lifestyle  . Physical activity    Days per week: 5 days    Minutes per session: 50 min  . Stress: To some extent  Relationships  . Social connections    Talks on phone: More than three times a week    Gets together: More than three times a week    Attends religious service: Never    Active member of club or organization: No    Attends meetings of clubs or organizations: Never    Relationship status: Married  . Intimate partner violence    Fear of current or ex partner: No    Emotionally abused: No    Physically abused: No    Forced sexual activity: No  Other Topics Concern  . Not on file  Social History Narrative    Married.  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 Dementia Mother   . Arthritis Mother   . Cancer Brother        prostate  . Asthma Sister   . Heart disease Paternal Aunt   . Heart  disease Paternal Uncle   . Hypertension Maternal Grandmother   . CVA Maternal Grandmother   . CVA Paternal Grandmother   . Hepatitis C Son   . Arthritis Son   . Colon cancer Neg Hx   . Colon polyps Neg Hx   . Kidney disease Neg Hx   . Esophageal cancer Neg Hx   . Gallbladder disease Neg Hx   . Diabetes Neg Hx     Objective: Office vital signs reviewed. BP 126/64   Pulse 62   Temp 98.4 F (36.9 C) (Temporal)   Ht 6' (1.829 m)   Wt 176 lb (79.8 kg)   BMI 23.87 kg/m   Physical Examination:  General: Awake, alert, well nourished, No acute distress HEENT: Normal; no exophthalmos.  No goiter Cardio: Irregularly irregular with rate control.  S1S2 heard, no murmurs appreciated Pulm: clear to auscultation bilaterally, no wheezes, rhonchi or rales; normal work of breathing on room air Extremities: warm, well perfused, No edema, cyanosis or clubbing; +2 pulses bilaterally Skin: dry; intact; no rashes or lesions; has a few tattoos on the forearms.  Normal temperature Neuro: No resting tremor Psych: Mood stable, speech normal, affect appropriate, pleasant and interactive.  Depression screen [facility] 2/9 07/28/2019 02/05/2019 01/26/2019  Decreased Interest 0 0 0  Down, Depressed, Hopeless 0 0 0  PHQ - 2 Score 0 0 0  Altered sleeping 0 - -  Tired, decreased energy 0 - -  Change in appetite 0 - -  Feeling bad or failure about yourself  0 - -  Trouble concentrating 0 - -  Moving slowly or fidgety/restless 0 - -  Suicidal thoughts 0 - -  PHQ-9 Score 0 - -  Some recent data might be hidden    Assessment/ Plan: 85 y.o. male   1. Chronic atrial fibrillation INR therapeutic at 2.4  today.  Continue current regimen.  Check CBC and CMP. - CMP14+EGFR - CBC - CoaguChek XS/INR Waived  2. Chronic anticoagulation No red flag - CBC - CoaguChek XS/INR Waived  3. Thrombocytopenia (HCC) Check CBC - CBC  4. Hypothyroidism, unspecified type Asymptomatic.  Check thyroid panel - Thyroid Panel With TSH  5. Aortic atherosclerosis (HCC) Blood pressure under good control today.  Continue statin and antihypertensives. - CMP14+EGFR  6. Benign prostatic hyperplasia with nocturia Followed closely by urologist, Dr. Alinda Money.  Stable on doxazosin  7. Major depressive disorder with single episode, in full remission ([place]) Stable.  Continue mirtazapine   Orders Placed This Encounter  Procedures  . CMP14+EGFR  . CBC  . CoaguChek XS/INR Waived  . Thyroid Panel With TSH   Meds ordered this encounter  Medications  . ramipril (ALTACE) 10 MG capsule    Sig: Take 1 capsule (10 mg total) by mouth daily.    Dispense:  90 capsule    Refill:  1  . mirtazapine (REMERON) 7.5 MG tablet    Sig: TAKE ONE-HALF (1/2) TABLET AT BEDTIME    Dispense:  45 tablet    Refill:  [place], DO [place] [phone]

## 2019-07-29 LAB — CBC
Hematocrit: 36.9 % — ABNORMAL LOW (ref 37.5–51.0)
Hemoglobin: 12.4 g/dL — ABNORMAL LOW (ref 13.0–17.7)
MCH: 30.9 pg (ref 26.6–33.0)
MCHC: 33.6 g/dL (ref 31.5–35.7)
MCV: 92 fL (ref 79–97)
Platelets: 121 10*3/uL — ABNORMAL LOW (ref 150–450)
RBC: 4.01 x10E6/uL — ABNORMAL LOW (ref 4.14–5.80)
RDW: 13.9 % (ref 11.6–15.4)
WBC: 6.3 10*3/uL (ref 3.4–10.8)

## 2019-07-29 LAB — CMP14+EGFR
ALT: 13 IU/L (ref 0–44)
AST: 18 IU/L (ref 0–40)
Albumin/Globulin Ratio: 1.9 (ref 1.2–2.2)
Albumin: 4.2 g/dL (ref 3.6–4.6)
Alkaline Phosphatase: 92 IU/L (ref 39–117)
BUN/Creatinine Ratio: 16 (ref 10–24)
BUN: 15 mg/dL (ref 8–27)
Bilirubin Total: 0.4 mg/dL (ref 0.0–1.2)
CO2: 28 mmol/L (ref 20–29)
Calcium: 9.2 mg/dL (ref 8.6–10.2)
Chloride: 104 mmol/L (ref 96–106)
Creatinine, Ser: 0.96 mg/dL (ref 0.76–1.27)
GFR calc Af Amer: 81 mL/min/{1.73_m2} (ref >59)
GFR calc non Af Amer: 70 mL/min/{1.73_m2} (ref >59)
Globulin, Total: 2.2 g/dL (ref 1.5–4.5)
Glucose: 78 mg/dL (ref 65–99)
Potassium: 4.1 mmol/L (ref 3.5–5.2)
Sodium: 144 mmol/L (ref 134–144)
Total Protein: 6.4 g/dL (ref 6.0–8.5)

## 2019-07-29 LAB — THYROID PANEL WITH TSH
Free Thyroxine Index: 2.3 (ref 1.2–4.9)
T3 Uptake Ratio: 32 % (ref 24–39)
T4, Total: 7.1 ug/dL (ref 4.5–12.0)
TSH: 2.85 u[IU]/mL (ref 0.450–4.500)

## 2019-08-16 ENCOUNTER — Encounter (HOSPITAL_COMMUNITY): Payer: Self-pay | Admitting: Emergency Medicine

## 2019-08-16 ENCOUNTER — Other Ambulatory Visit: Payer: Self-pay

## 2019-08-16 ENCOUNTER — Emergency Department (HOSPITAL_COMMUNITY)
Admission: EM | Admit: 2019-08-16 | Discharge: 2019-08-16 | Disposition: A | Discharge status: Home / Self Care | Payer: Medicare Other | Attending: Emergency Medicine | Admitting: Emergency Medicine

## 2019-08-16 ENCOUNTER — Emergency Department (HOSPITAL_COMMUNITY): Payer: Medicare Other

## 2019-08-16 DIAGNOSIS — I2510 Atherosclerotic heart disease of native coronary artery without angina pectoris: Secondary | ICD-10-CM | POA: Diagnosis not present

## 2019-08-16 DIAGNOSIS — Z8673 Personal history of transient ischemic attack (TIA), and cerebral infarction without residual deficits: Secondary | ICD-10-CM | POA: Insufficient documentation

## 2019-08-16 DIAGNOSIS — M25461 Effusion, right knee: Secondary | ICD-10-CM | POA: Diagnosis not present

## 2019-08-16 DIAGNOSIS — I10 Essential (primary) hypertension: Secondary | ICD-10-CM | POA: Insufficient documentation

## 2019-08-16 DIAGNOSIS — R600 Localized edema: Secondary | ICD-10-CM | POA: Diagnosis not present

## 2019-08-16 DIAGNOSIS — E039 Hypothyroidism, unspecified: Secondary | ICD-10-CM | POA: Insufficient documentation

## 2019-08-16 DIAGNOSIS — M1711 Unilateral primary osteoarthritis, right knee: Secondary | ICD-10-CM | POA: Diagnosis not present

## 2019-08-16 DIAGNOSIS — M25561 Pain in right knee: Secondary | ICD-10-CM | POA: Diagnosis present

## 2019-08-16 DIAGNOSIS — Z7901 Long term (current) use of anticoagulants: Secondary | ICD-10-CM | POA: Diagnosis not present

## 2019-08-16 DIAGNOSIS — Z87891 Personal history of nicotine dependence: Secondary | ICD-10-CM | POA: Diagnosis not present

## 2019-08-16 DIAGNOSIS — R52 Pain, unspecified: Secondary | ICD-10-CM | POA: Diagnosis not present

## 2019-08-16 LAB — CBC WITH DIFFERENTIAL/PLATELET
Abs Immature Granulocytes: 0.01 10*3/uL (ref 0.00–0.07)
Basophils Absolute: 0 10*3/uL (ref 0.0–0.1)
Basophils Relative: 0 %
Eosinophils Absolute: 0 10*3/uL (ref 0.0–0.5)
Eosinophils Relative: 1 %
HCT: 41.5 % (ref 39.0–52.0)
Hemoglobin: 13 g/dL (ref 13.0–17.0)
Immature Granulocytes: 0 %
Lymphocytes Relative: 26 %
Lymphs Abs: 2.2 10*3/uL (ref 0.7–4.0)
MCH: 30.2 pg (ref 26.0–34.0)
MCHC: 31.3 g/dL (ref 30.0–36.0)
MCV: 96.5 fL (ref 80.0–100.0)
Monocytes Absolute: 0.6 10*3/uL (ref 0.1–1.0)
Monocytes Relative: 7 %
Neutro Abs: 5.7 10*3/uL (ref 1.7–7.7)
Neutrophils Relative %: 66 %
Platelets: 123 10*3/uL — ABNORMAL LOW (ref 150–400)
RBC: 4.3 MIL/uL (ref 4.22–5.81)
RDW: 14.5 % (ref 11.5–15.5)
WBC: 8.5 10*3/uL (ref 4.0–10.5)
nRBC: 0 % (ref 0.0–0.2)

## 2019-08-16 LAB — BASIC METABOLIC PANEL
Anion gap: 9 (ref 5–15)
BUN: 13 mg/dL (ref 8–23)
CO2: 29 mmol/L (ref 22–32)
Calcium: 9.4 mg/dL (ref 8.9–10.3)
Chloride: 102 mmol/L (ref 98–111)
Creatinine, Ser: 0.86 mg/dL (ref 0.61–1.24)
GFR calc Af Amer: >60 mL/min (ref >60)
GFR calc non Af Amer: >60 mL/min (ref >60)
Glucose, Bld: 135 mg/dL — ABNORMAL HIGH (ref 70–99)
Potassium: 3.4 mmol/L — ABNORMAL LOW (ref 3.5–5.1)
Sodium: 140 mmol/L (ref 135–145)

## 2019-08-16 LAB — PROTIME-INR
INR: 2.2 — ABNORMAL HIGH (ref 0.8–1.2)
Prothrombin Time: 24.3 seconds — ABNORMAL HIGH (ref 11.4–15.2)

## 2019-08-16 MED ORDER — HYDROCODONE-ACETAMINOPHEN 5-325 MG PO TABS: 1.0000 | ORAL_TABLET | ORAL | 0 refills | Status: DC | PRN

## 2019-08-16 MED ORDER — HYDROCODONE-ACETAMINOPHEN 5-325 MG PO TABS
1.0000 | ORAL_TABLET | Freq: Once | ORAL | Status: AC
  Administered 2019-08-16: 1 via ORAL
  Filled 2019-08-16: qty 1

## 2019-08-16 MED ORDER — LIDOCAINE HCL (PF) 2 % IJ SOLN: 10.0000 mL | Freq: Once | INTRAMUSCULAR | Status: DC

## 2019-08-16 MED ORDER — LIDOCAINE-EPINEPHRINE (PF) 2 %-1:200000 IJ SOLN
INTRAMUSCULAR | Status: AC
  Administered 2019-08-16: 16:00:00
  Filled 2019-08-16: qty 20

## 2019-08-16 MED ORDER — POVIDONE-IODINE 10 % EX SOLN
CUTANEOUS | Status: DC | PRN
  Administered 2019-08-16: 16:00:00 via TOPICAL
  Filled 2019-08-16: qty 15

## 2019-08-16 NOTE — ED Provider Notes (Signed)
[facility] EMERGENCY DEPARTMENT Provider Note   CSN: [id number] Arrival date & time: 08/16/19  1038     History   Chief Complaint Chief Complaint  Patient presents with   Knee Pain    HPI Dalton Vaughan is a 85 y.o. male with a history as outlined below, sig for afib on coumadin and history of arthritis with prior knee joint steroid injections (Dr. Wynelle Link, about 4 years ago) presenting with pain and swelling in the right knee which started yesterday.  He was wearing a compression stocking on his legs and when he went to take his stocking off last night, noted significant swelling around the right knee and decreasing ability to weight bear and flex the knee secondary to pain and tightness.  He denies injury, falls, planting/twisting type injuries.  He has take tylenol and applied ice and elevation with much improvement.  No fevers or chills.      The history is provided by the patient.    Past Medical History:  Diagnosis Date   Anal fissure    Atrial fibrillation (HCC)    BPH (benign prostatic hypertrophy)    CAD (coronary artery disease)    Dr. Wynonia Lawman   Cataract    Depression 2004   Diverticulosis    GERD (gastroesophageal reflux disease)    History of TIAs    Hyperlipidemia    Hypertension    Hypertensive heart disease without CHF    Hypothyroidism    Internal hemorrhoids    Lumbar disc disease    Oral bleeding 08/20/2012   Following molar tooth extraction July, 2013   Ruptured disk 1995   Shingles    Thyroid disease    Vitamin D deficiency    Vitreous detachment     Patient Active Problem List   Diagnosis Date Noted   Osteoarthritis of spine with radiculopathy, lumbar region 01/08/2018   Thyroid disease    Aortic atherosclerosis ([place]) 05/02/2017   Thrombocytopenia ([place]) 07/10/2016   Hereditary and idiopathic peripheral neuropathy 10/16/2015   Mononeuropathy 10/04/2015   Allergic rhinitis 05/06/2014   Depression 08/03/2013    Sick sinus syndrome ([place]) 07/31/2012   Hypertensive heart disease without CHF    Chronic atrial fibrillation (HCC)    Coronary artery disease involving native coronary artery of native heart with angina pectoris ([place])    History of TIAs    Lumbar disc disease    GERD (gastroesophageal reflux disease)    Chronic anticoagulation    Benign prostatic hyperplasia    Hyperlipidemia     Past Surgical History:  Procedure Laterality Date   bilateral cataract surgery  6/07   CARDIAC CATHETERIZATION     CORONARY ARTERY BYPASS GRAFT  12/99    Dr. Servando Snare    cysloscopy  8/08   [place]   right   herninated disc  1995   PROSTATE BIOPSY  8/08   TONSILLECTOMY AND ADENOIDECTOMY          Home Medications    Prior to Admission medications   Medication Sig Start Date End Date Taking? Authorizing Provider  amLODipine (NORVASC) 5 MG tablet Take 1 tablet (5 mg total) by mouth daily. 05/06/19   Chipper Herb, MD  Calcium Carb-Cholecalciferol (CALCIUM + D3) 600-200 MG-UNIT TABS Take 0.25 tablets by mouth daily.    [provider]  Cholecalciferol (VITAMIN D-3) 5000 UNITS TABS Take 1 tablet by mouth daily.     [provider]  doxazosin (  CARDURA) 8 MG tablet Take 1 tablet (8 mg total) by mouth at bedtime. 05/06/19   Chipper Herb, MD  Elastic Bandages & Supports (MEDICAL COMPRESSION SOCKS) MISC 1 each by Does not apply route daily. Patient not taking: Reported on 07/28/2019 11/25/18   Chevis Pretty, FNP  furosemide (LASIX) 40 MG tablet Take 1 tablet (40 mg total) by mouth daily. 11/17/18   Hassell Done Mary-Margaret, FNP  HYDROcodone-acetaminophen (NORCO/VICODIN) 5-325 MG tablet Take 1 tablet by mouth every 4 (four) hours as needed. 08/16/19   Evalee Jefferson, PA-C  HYDROcodone-acetaminophen (NORCO/VICODIN) 5-325 MG tablet Take 1 tablet by mouth every 4 (four) hours as needed. 08/16/19   Evalee Jefferson, PA-C  levothyroxine (SYNTHROID) 50 MCG  tablet TAKE 1 TABLET DAILY 06/25/19   Ronnie Doss M, DO  mirtazapine (REMERON) 7.5 MG tablet TAKE ONE-HALF (1/2) TABLET AT BEDTIME 07/28/19   Ronnie Doss M, DO  Multiple Vitamin (MULTIVITAMIN) tablet Take 1 tablet by mouth daily.      [provider]  olopatadine (PATANOL) 0.1 % ophthalmic solution  07/17/19   [provider]  ramipril (ALTACE) 10 MG capsule Take 1 capsule (10 mg total) by mouth daily. 07/28/19   [name], DO  rosuvastatin (CRESTOR) 5 MG tablet Take 1 tablet (5 mg total) by mouth daily. as directed 05/06/19   Chipper Herb, MD  triamterene-hydrochlorothiazide (MAXZIDE-25) 37.5-25 MG tablet Take 1 tablet by mouth daily. 07/17/19   [provider]  vitamin C (ASCORBIC ACID) 500 MG tablet Take 500 mg by mouth daily. Take 1/4 tablet by mouth daily.    [provider]  warfarin (COUMADIN) 5 MG tablet TAKE AS DIRECTED 09/24/18   Chipper Herb, MD  XIIDRA 5 % SOLN  07/17/19   [provider]    Family History Family History  Problem Relation Age of Onset   Dementia Mother    Arthritis Mother    Cancer Brother        prostate   Asthma Sister    Heart disease Paternal Aunt    Heart disease Paternal Uncle    Hypertension Maternal Grandmother    CVA Maternal Grandmother    CVA Paternal Grandmother    Hepatitis C Son    Arthritis Son    Colon cancer Neg Hx    Colon polyps Neg Hx    Kidney disease Neg Hx    Esophageal cancer Neg Hx    Gallbladder disease Neg Hx    Diabetes Neg Hx     Social History Social History   Tobacco Use   Smoking status: Former Smoker    Packs/day: 1.00    Years: 18.00    Pack years: 18.00    Types: Cigarettes    Start date: 12/17/1948    Quit date: 12/17/1966    Years since quitting: 52.6   Smokeless tobacco: Never Used  Substance Use Topics   Alcohol use: Yes    Alcohol/week: 1.0 standard drinks    Types: 1 Standard drinks or equivalent per week    Comment:  Occasionally   Drug use: No     Allergies   Paxil [paroxetine hcl], Eliquis [apixaban], Penicillins, Pravachol [pravastatin sodium], Zetia [ezetimibe], and Vytorin [ezetimibe-simvastatin]   Review of Systems Review of Systems  Constitutional: Negative for chills and fever.  Musculoskeletal: Positive for arthralgias and joint swelling. Negative for myalgias.  Skin: Negative.   Neurological: Negative for weakness and numbness.     Physical Exam Updated Vital Signs BP 132/82 (BP  Location: Right Arm)    Pulse 78    Temp 98.6 F (37 C) (Oral)    Resp 16    Ht 6' (1.829 m)    Wt 81.6 kg    SpO2 96%    BMI 24.41 kg/m   Physical Exam Constitutional:      Appearance: He is well-developed.  HENT:     Head: Atraumatic.  Neck:     Musculoskeletal: Normal range of motion.  Cardiovascular:     Comments: Pulses equal bilaterally Musculoskeletal:        General: Swelling and tenderness present.     Right knee: He exhibits decreased range of motion and effusion. He exhibits no deformity, no erythema, normal alignment, no LCL laxity and no MCL laxity.     Comments: ttp along anterior right knee joint space.  Anterior discomfort with valgus/varus strain.  Patellar tendon and rectus femoris tendons intact.  Pt unable to SLR right leg secondary to pain.  Skin:    General: Skin is warm and dry.     Findings: No erythema, lesion or rash.  Neurological:     Mental Status: He is alert.     Sensory: No sensory deficit.     Deep Tendon Reflexes: Reflexes normal.      ED Treatments / Results  Labs (all labs ordered are listed, but only abnormal results are displayed) Labs Reviewed  PROTIME-INR - Abnormal; Notable for the following components:      Result Value   Prothrombin Time 24.3 (*)    INR 2.2 (*)    All other components within normal limits  CBC WITH DIFFERENTIAL/PLATELET - Abnormal; Notable for the following components:   Platelets 123 (*)    All other components within normal  limits  BASIC METABOLIC PANEL - Abnormal; Notable for the following components:   Potassium 3.4 (*)    Glucose, Bld 135 (*)    All other components within normal limits    EKG None  Radiology Ct Knee Right Wo Contrast  Result Date: 08/16/2019 CLINICAL DATA:  Right knee pain started this morning. Difficulty bearing weight. EXAM: CT OF THE right KNEE WITHOUT CONTRAST TECHNIQUE: Multidetector CT imaging of the right knee was performed according to the standard protocol. Multiplanar CT image reconstructions were also generated. COMPARISON:  Radiographs from 08/16/2019 FINDINGS: Bones/Joint/Cartilage Tricompartmental spurring. Large complex mixed density knee joint effusion favoring hemarthrosis over severe synovitis or synovial mass. Meniscal chondrocalcinosis. Lateral compartmental articular space narrowing. Ligaments Suboptimally assessed by CT. Muscles and Tendons Low-level edema tracks along the inferior margins of the vastus medialis and lateralis muscles. Distal atrophy of the semimembranosus muscle. Infiltrative edema in the popliteal space. Soft tissues Atherosclerotic vascular calcifications. Complex Baker's cyst. Clips medially in the [facility] likely from saphenous vein harvesting. IMPRESSION: 1. Large joint effusion with mixed but primarily high density components favoring hemarthrosis over synovial mass or severe synovitis. Complex Baker's cyst. Cause for hemarthrosis not observed; no discrete fracture is identified. 2. Tricompartmental spurring with lateral compartmental articular space narrowing. Meniscal chondrocalcinosis possibly from CPPD arthropathy. 3. Infiltrative edema in the popliteal space. Low-level edema along the inferior margins of the vastus medialis and vastus lateralis muscles. 4. Distal atrophy of the semimembranosus muscle. 5. Atherosclerosis. Electronically Signed   By: Van Clines M.D.   On: 08/16/2019 13:11   Dg Knee Complete 4 Views Right  Result Date:  08/16/2019 CLINICAL DATA:  Right-sided knee pain beginning last night. Swelling. No trauma. EXAM: RIGHT KNEE - COMPLETE 4+ VIEW  COMPARISON:  None. FINDINGS: Surgical clips medial to the knee. Vascular calcifications. Mild 3 compartment osteoarthritis. Suspect a small suprapatellar joint effusion. No acute fracture or dislocation. Enthesophytes at the quadriceps insertion. IMPRESSION: Probable small suprapatellar joint effusion. Electronically Signed   By: Abigail Miyamoto M.D.   On: 08/16/2019 11:45    Procedures .Joint Aspiration/Arthrocentesis  Date/Time: 08/16/2019 4:00 PM Performed by: Evalee Jefferson, PA-C Authorized by: Evalee Jefferson, PA-C   Consent:    Consent obtained:  Verbal   Consent given by:  Patient   Risks discussed:  Bleeding, incomplete drainage, infection and pain   Alternatives discussed:  No treatment Location:    Location:  Knee   Knee:  R knee Anesthesia (see MAR for exact dosages):    Anesthesia method:  Local infiltration   Local anesthetic:  Lidocaine 2% WITH epi Procedure details:    Preparation: Patient was prepped and draped in usual sterile fashion     Needle gauge:  18 G   Ultrasound guidance: no     Approach:  Lateral   Aspirate amount:  65 cc   Aspirate characteristics:  Bloody   Steroid injected: no     Specimen collected: no   Post-procedure details:    Dressing:  Adhesive bandage   Patient tolerance of procedure:  Tolerated well, no immediate complications Comments:     Procedure completed for therapeutic pain relief and provided sig improvement in pain.   (including critical care time)    Medications Ordered in ED Medications  lidocaine (XYLOCAINE) 2 % injection 10 mL (10 mLs Other Not Given 08/16/19 1619)  povidone-iodine (BETADINE) 10 % external solution ( Topical Given by Other 08/16/19 1608)  HYDROcodone-acetaminophen (NORCO/VICODIN) 5-325 MG per tablet 1 tablet (1 tablet Oral Given 08/16/19 1241)  HYDROcodone-acetaminophen (NORCO/VICODIN) 5-325  MG per tablet 1 tablet (1 tablet Oral Given 08/16/19 1400)  lidocaine-EPINEPHrine (XYLOCAINE W/EPI) 2 %-1:200000 (PF) injection (  Given by Other 08/16/19 1607)     Initial Impression / Assessment and Plan / ED Course  I have reviewed the triage vital signs and the nursing notes.  Pertinent labs & imaging results that were available during my care of the patient were reviewed by me and considered in my medical decision making (see chart for details).        Discussed with Dr. Theda Sers with [facility], on call for Dr Wynelle Link.  Recommended therapeutic aspiration, watson jones, and knee immobilizer for the next 3 days, then prn f/u.  Pt tolerated aspiration without complication and with improvement in pain.  No evidence for infection in the joint.  Hydrocodone prescribed, ice, elevation. Plan f/u with Dr Wynelle Link prn.    Pt agrees with plan.   Final Clinical Impressions(s) / ED Diagnoses   Final diagnoses:  Effusion of right knee    ED Discharge Orders         Ordered    HYDROcodone-acetaminophen (NORCO/VICODIN) 5-325 MG tablet  Every 4 hours PRN     08/16/19 1646    HYDROcodone-acetaminophen (NORCO/VICODIN) 5-325 MG tablet  Every 4 hours PRN     08/16/19 1652           Evalee Jefferson, PA-C 08/16/19 [place], [place], DO 08/20/19 1542

## 2019-08-16 NOTE — Discharge Instructions (Addendum)
Keep your knee wrapped for the next 3 days and avoid bending/flexing the joint by wearing the knee immobilizer.  Elevation and ice can also be used to help with pain and swelling.  You may take the hydrocodone prescribed for pain relief.  This will make you drowsy - do not drive within 4 hours of taking this medication.  Call Dr. Wynelle Link for further evaluation of your knee if your pain or swelling persists or worsens.

## 2019-08-16 NOTE — ED Notes (Signed)
Patient is not able to ambulate- he is unable to bear weight on the affected knee.

## 2019-08-16 NOTE — ED Notes (Signed)
Patient transported to CT 

## 2019-08-16 NOTE — ED Triage Notes (Signed)
Patient presents with right side knee pain that began last night. Denies fall.

## 2019-08-17 MED FILL — Hydrocodone-Acetaminophen Tab 5-325 MG: ORAL | Qty: 6 | Status: AC

## 2019-08-25 DIAGNOSIS — M1712 Unilateral primary osteoarthritis, left knee: Secondary | ICD-10-CM | POA: Diagnosis not present

## 2019-08-25 DIAGNOSIS — T45515A Adverse effect of anticoagulants, initial encounter: Secondary | ICD-10-CM | POA: Diagnosis present

## 2019-08-25 DIAGNOSIS — I482 Chronic atrial fibrillation, unspecified: Secondary | ICD-10-CM | POA: Diagnosis not present

## 2019-08-25 DIAGNOSIS — R609 Edema, unspecified: Secondary | ICD-10-CM | POA: Diagnosis not present

## 2019-08-25 DIAGNOSIS — M2500 Hemarthrosis, unspecified joint: Secondary | ICD-10-CM | POA: Diagnosis not present

## 2019-08-25 DIAGNOSIS — M25561 Pain in right knee: Secondary | ICD-10-CM | POA: Diagnosis not present

## 2019-08-25 DIAGNOSIS — I2510 Atherosclerotic heart disease of native coronary artery without angina pectoris: Secondary | ICD-10-CM | POA: Diagnosis present

## 2019-08-25 DIAGNOSIS — M25062 Hemarthrosis, left knee: Secondary | ICD-10-CM | POA: Diagnosis not present

## 2019-08-25 DIAGNOSIS — M1711 Unilateral primary osteoarthritis, right knee: Secondary | ICD-10-CM | POA: Diagnosis not present

## 2019-08-25 DIAGNOSIS — E785 Hyperlipidemia, unspecified: Secondary | ICD-10-CM | POA: Diagnosis not present

## 2019-08-25 DIAGNOSIS — S83281A Other tear of lateral meniscus, current injury, right knee, initial encounter: Secondary | ICD-10-CM | POA: Diagnosis not present

## 2019-08-25 DIAGNOSIS — M25461 Effusion, right knee: Secondary | ICD-10-CM | POA: Diagnosis not present

## 2019-08-25 DIAGNOSIS — I4891 Unspecified atrial fibrillation: Secondary | ICD-10-CM | POA: Diagnosis not present

## 2019-08-25 DIAGNOSIS — Z7901 Long term (current) use of anticoagulants: Secondary | ICD-10-CM | POA: Diagnosis not present

## 2019-08-25 DIAGNOSIS — I10 Essential (primary) hypertension: Secondary | ICD-10-CM | POA: Diagnosis not present

## 2019-08-25 DIAGNOSIS — S83241A Other tear of medial meniscus, current injury, right knee, initial encounter: Secondary | ICD-10-CM | POA: Diagnosis not present

## 2019-08-25 DIAGNOSIS — Z20828 Contact with and (suspected) exposure to other viral communicable diseases: Secondary | ICD-10-CM | POA: Diagnosis present

## 2019-08-25 DIAGNOSIS — R5381 Other malaise: Secondary | ICD-10-CM | POA: Diagnosis not present

## 2019-08-25 DIAGNOSIS — Z951 Presence of aortocoronary bypass graft: Secondary | ICD-10-CM | POA: Diagnosis not present

## 2019-08-25 DIAGNOSIS — R52 Pain, unspecified: Secondary | ICD-10-CM | POA: Diagnosis not present

## 2019-08-25 DIAGNOSIS — M25061 Hemarthrosis, right knee: Secondary | ICD-10-CM | POA: Diagnosis not present

## 2019-08-26 DIAGNOSIS — M2500 Hemarthrosis, unspecified joint: Secondary | ICD-10-CM | POA: Diagnosis not present

## 2019-08-26 DIAGNOSIS — Z7901 Long term (current) use of anticoagulants: Secondary | ICD-10-CM | POA: Diagnosis not present

## 2019-08-26 DIAGNOSIS — M1712 Unilateral primary osteoarthritis, left knee: Secondary | ICD-10-CM | POA: Diagnosis not present

## 2019-08-26 DIAGNOSIS — I10 Essential (primary) hypertension: Secondary | ICD-10-CM | POA: Diagnosis not present

## 2019-08-26 DIAGNOSIS — I4891 Unspecified atrial fibrillation: Secondary | ICD-10-CM | POA: Diagnosis not present

## 2019-08-26 DIAGNOSIS — M25062 Hemarthrosis, left knee: Secondary | ICD-10-CM | POA: Diagnosis not present

## 2019-08-28 DIAGNOSIS — M25461 Effusion, right knee: Secondary | ICD-10-CM | POA: Diagnosis not present

## 2019-09-08 ENCOUNTER — Other Ambulatory Visit: Payer: Self-pay

## 2019-09-09 ENCOUNTER — Ambulatory Visit (INDEPENDENT_AMBULATORY_CARE_PROVIDER_SITE_OTHER): Payer: Medicare Other | Admitting: Family Medicine

## 2019-09-09 ENCOUNTER — Telehealth: Payer: Self-pay | Admitting: Family Medicine

## 2019-09-09 ENCOUNTER — Encounter: Payer: Self-pay | Admitting: Family Medicine

## 2019-09-09 VITALS — BP 126/75 | HR 103 | Temp 98.8°F | Ht 72.0 in | Wt 165.0 lb

## 2019-09-09 DIAGNOSIS — Z7901 Long term (current) use of anticoagulants: Secondary | ICD-10-CM

## 2019-09-09 DIAGNOSIS — Z87891 Personal history of nicotine dependence: Secondary | ICD-10-CM | POA: Diagnosis not present

## 2019-09-09 DIAGNOSIS — R05 Cough: Secondary | ICD-10-CM

## 2019-09-09 DIAGNOSIS — I482 Chronic atrial fibrillation, unspecified: Secondary | ICD-10-CM | POA: Diagnosis not present

## 2019-09-09 DIAGNOSIS — F321 Major depressive disorder, single episode, moderate: Secondary | ICD-10-CM | POA: Diagnosis not present

## 2019-09-09 DIAGNOSIS — M5430 Sciatica, unspecified side: Secondary | ICD-10-CM | POA: Diagnosis not present

## 2019-09-09 DIAGNOSIS — R634 Abnormal weight loss: Secondary | ICD-10-CM | POA: Diagnosis not present

## 2019-09-09 DIAGNOSIS — R791 Abnormal coagulation profile: Secondary | ICD-10-CM

## 2019-09-09 DIAGNOSIS — R053 Chronic cough: Secondary | ICD-10-CM

## 2019-09-09 LAB — COAGUCHEK XS/INR WAIVED
INR: 4.5 — ABNORMAL HIGH (ref 0.9–1.1)
Prothrombin Time: 54.4 s

## 2019-09-09 LAB — POCT INR: INR: 4.5 — AB (ref 2–3)

## 2019-09-09 MED ORDER — MIRTAZAPINE 15 MG PO TABS: 15.0000 mg | ORAL_TABLET | Freq: Every day | ORAL | 1 refills | Status: DC

## 2019-09-09 MED ORDER — GABAPENTIN 100 MG PO CAPS: 100.0000 mg | ORAL_CAPSULE | Freq: Every day | ORAL | 1 refills | Status: DC

## 2019-09-09 NOTE — Patient Instructions (Addendum)
Your level is too high today. Make the adjustments we discussed and follow up in ONE week for recheck.  I am ordering a CT scan of your chest given chronic cough, weight loss and former smoking status.  I have increased your Mirtazapine to 5m daily.  Follow up in 4 weeks with me for recheck of mood.  We added Gabapentin for the leg pain.  Take at night.

## 2019-09-09 NOTE — Telephone Encounter (Signed)
Appt made for 9/29 at 11am

## 2019-09-09 NOTE — Progress Notes (Signed)
Subjective: CC: afib anticoagulated on warfarin PCP: [name], DO HPI:Dalton Vaughan is a 85 y.o. male presenting to clinic today for:  1.  Atrial fibrillation/ Chronic anticoagulation Patient reports compliance with warfarin 5 mg daily (except for Fridays when he takes 2.5 mg).  Last INR was therapeutic at 2.2.  He denies heart palpitations, chest pain, shortness of breath, dizziness, melena, hematochezia, hematuria or abnormal bleeding.  2.  Depression/ unplanned weight loss/ chronic cough Patient notes that he has had increased depression over the last several weeks and his wife passed away yesterday.  He has had decreased appetite, is losing weight.  Of note he also mentions a chronic cough that is been going on for greater than 1 year now.  He had a chest x-[facility] in December of last year but this did not show any significant lung findings.  He is a former smoker that quit in the 20s.  He smoked for about 18 years.  He has been taking his mirtazapine 7.5 mg nightly but increased it to 15 mg last night.  He is wondering if he should continue with a dose increase.  Additionally, he notes some debility since his right knee surgery.  He has not been working with physical therapy due to illness of his wife.  He has follow-up with the surgeon at the end of October.  He notes a sciatic pain going down the right lower extremity.  He is ambulating with the use of a walker.  ROS: Per HPI  Allergies  Allergen Reactions   Paxil [Paroxetine Hcl] Palpitations   Eliquis [Apixaban] Other (See Comments)    dizziness   Penicillins     Said he is  Allergic" Natural combinations-extended release   Pravachol [Pravastatin Sodium]    Zetia [Ezetimibe]     myaalgias   Vytorin [Ezetimibe-Simvastatin] Other (See Comments)   Past Medical History:  Diagnosis Date   Anal fissure    Atrial fibrillation (HCC)    BPH (benign prostatic hypertrophy)    CAD (coronary artery disease)    Dr.  Wynonia Lawman   Cataract    Depression 2004   Diverticulosis    GERD (gastroesophageal reflux disease)    History of TIAs    Hyperlipidemia    Hypertension    Hypertensive heart disease without CHF    Hypothyroidism    Internal hemorrhoids    Lumbar disc disease    Oral bleeding 08/20/2012   Following molar tooth extraction July, 2013   Ruptured disk 1995   Shingles    Thyroid disease    Vitamin D deficiency    Vitreous detachment     Current Outpatient Medications:    amLODipine (NORVASC) 5 MG tablet, Take 1 tablet (5 mg total) by mouth daily., Disp: 90 tablet, Rfl: 3   Calcium Carb-Cholecalciferol (CALCIUM + D3) 600-200 MG-UNIT TABS, Take 0.25 tablets by mouth daily., Disp: , Rfl:    Cholecalciferol (VITAMIN D-3) 5000 UNITS TABS, Take 1 tablet by mouth daily. , Disp: , Rfl:    doxazosin (CARDURA) 8 MG tablet, Take 1 tablet (8 mg total) by mouth at bedtime., Disp: 90 tablet, Rfl: 3   Elastic Bandages & Supports (MEDICAL COMPRESSION SOCKS) MISC, 1 each by Does not apply route daily., Disp: 1 each, Rfl: 2   furosemide (LASIX) 40 MG tablet, Take 1 tablet (40 mg total) by mouth daily., Disp: 30 tablet, Rfl: 3   HYDROcodone-acetaminophen (NORCO/VICODIN) 5-325 MG tablet, Take 1 tablet by mouth every 4 (four) hours  as needed., Disp: 15 tablet, Rfl: 0   HYDROcodone-acetaminophen (NORCO/VICODIN) 5-325 MG tablet, Take 1 tablet by mouth every 4 (four) hours as needed., Disp: 6 tablet, Rfl: 0   levothyroxine (SYNTHROID) 50 MCG tablet, TAKE 1 TABLET DAILY, Disp: 90 tablet, Rfl: 1   mirtazapine (REMERON) 7.5 MG tablet, TAKE ONE-HALF (1/2) TABLET AT BEDTIME, Disp: 45 tablet, Rfl: 3   Multiple Vitamin (MULTIVITAMIN) tablet, Take 1 tablet by mouth daily.  , Disp: , Rfl:    olopatadine (PATANOL) 0.1 % ophthalmic solution, , Disp: , Rfl:    oxyCODONE (OXY IR/ROXICODONE) 5 MG immediate release tablet, TAKE 1 TABLET BY MOUTH EVERY 4 HOURS AS NEEDED FOR moderate pain (score 4-6),  Disp: , Rfl:    ramipril (ALTACE) 10 MG capsule, Take 1 capsule (10 mg total) by mouth daily., Disp: 90 capsule, Rfl: 1   rosuvastatin (CRESTOR) 5 MG tablet, Take 1 tablet (5 mg total) by mouth daily. as directed, Disp: 90 tablet, Rfl: 3   triamterene-hydrochlorothiazide (MAXZIDE-25) 37.5-25 MG tablet, Take 1 tablet by mouth daily., Disp: , Rfl:    vitamin C (ASCORBIC ACID) 500 MG tablet, Take 500 mg by mouth daily. Take 1/4 tablet by mouth daily., Disp: , Rfl:    warfarin (COUMADIN) 5 MG tablet, TAKE AS DIRECTED, Disp: 90 tablet, Rfl: 3   XIIDRA 5 % SOLN, , Disp: , Rfl:  Social History   Socioeconomic History   Marital status: Married    Spouse name: [name]    Number of children: 1   Years of education: 12   Highest education level: Not on file  Occupational History   Occupation: Retired    Hydrologist: Social research officer, government x 27 years   Occupation: Retired    Fish farm manager: Marine scientist    Comment: Librarian, academic x 17 years  Social Designer, fashion/clothing strain: Not hard at all   Food insecurity    Worry: Never true    Inability: Never true   Transportation needs    Medical: No    Non-medical: No  Tobacco Use   Smoking status: Former Smoker    Packs/day: 1.00    Years: 18.00    Pack years: 18.00    Types: Cigarettes    Start date: 12/17/1948    Quit date: 12/17/1966    Years since quitting: 52.7   Smokeless tobacco: Never Used  Substance and Sexual Activity   Alcohol use: Yes    Alcohol/week: 1.0 standard drinks    Types: 1 Standard drinks or equivalent per week    Comment: Occasionally   Drug use: No   Sexual activity: Not on file  Lifestyle   Physical activity    Days per week: 5 days    Minutes per session: 50 min   Stress: To some extent  Relationships   Social connections    Talks on phone: More than three times a week    Gets together: More than three times a week    Attends religious service: Never    Active member of club or organization: No     Attends meetings of clubs or organizations: Never    Relationship status: Married   Intimate partner violence    Fear of current or ex partner: No    Emotionally abused: No    Physically abused: No    Forced sexual activity: No  Other Topics Concern   Not on file  Social History Narrative   Married.  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, music  at [employer]      Right-handed      Caffeine use: none   Family History  Problem Relation Age of Onset   Dementia Mother    Arthritis Mother    Cancer Brother        prostate   Asthma Sister    Heart disease Paternal Aunt    Heart disease Paternal Uncle    Hypertension Maternal Grandmother    CVA Maternal Grandmother    CVA Paternal Grandmother    Hepatitis C Son    Arthritis Son    Colon cancer Neg Hx    Colon polyps Neg Hx    Kidney disease Neg Hx    Esophageal cancer Neg Hx    Gallbladder disease Neg Hx    Diabetes Neg Hx     Objective: Office vital signs reviewed. BP 126/75    Pulse (!) 103    Temp 98.8 F (37.1 C) (Temporal)    Ht 6' (1.829 m)    Wt 165 lb (74.8 kg)    SpO2 94%    BMI 22.38 kg/m   Physical Examination:  General: Awake, alert, well nourished, No acute distress HEENT: Normal; Cardio: Irregularly irregular with rate control.  S1S2 heard, no murmurs appreciated Pulm: clear to auscultation bilaterally, no wheezes, rhonchi. ? Mild rales in left lung field; normal work of breathing on room air Extremities: warm, well perfused, No edema, cyanosis or clubbing; +2 pulses bilaterally Neuro: No resting tremor Psych: Mood depressed, intermittently tearful, speech normal, affect appropriate  Depression screen [facility] 2/9 09/09/2019 07/28/2019 02/05/2019  Decreased Interest 0 0 0  Down, Depressed, Hopeless 1 0 0  PHQ - 2 Score 1 0 0  Altered sleeping 1 0 -  Tired, decreased energy 0 0 -  Change in appetite 0 0 -  Feeling bad or failure about yourself  0 0 -  Trouble concentrating 0 0 -    Moving slowly or fidgety/restless 0 0 -  Suicidal thoughts 0 0 -  PHQ-9 Score 2 0 -  Some recent data might be hidden    Assessment/ Plan: 85 y.o. male   1. Chronic atrial fibrillation His levels are supratherapeutic with an INR 4.5 today.  I advised him to skip today's dose, eat a vitamin K rich meal and resume typical dosing with plans of recheck of INR in 1 week.  May need to consider reduction of dose altogether - CoaguChek XS/INR Waived - POCT INR  2. Supratherapeutic INR  3. Chronic anticoagulation - POCT INR  4. Unexplained weight loss Possibly related to depression but given chronic cough and former smoking status will evaluate further to rule out pulmonary neoplasm.  I have ordered a CT chest.  His chest x-Rilyn from December of last year not show any findings concerning for neoplasm. - CT Chest Wo Contrast; Future  5. Chronic cough - CT Chest Wo Contrast; Future  6. Former smoker - CT Chest Wo Contrast; Future  7. Current moderate episode of major depressive disorder without prior episode ([place]) Not controlled.  Likely exacerbated by recent passing of his wife.  I agree with increased dose of mirtazapine to 15 mg.  Plan to recheck in about 4 to 6 weeks and possibly advance dose further if needed.  I did offer him counseling services today but he declined them. - mirtazapine (REMERON) 15 MG tablet; Take 1 tablet (15 mg total) by mouth at bedtime.  Dispense: 30 tablet; Refill: 1  8. Sciatic leg  pain We will start low-dose gabapentin at bedtime.  He will follow-up with orthopedics as scheduled - gabapentin (NEURONTIN) 100 MG capsule; Take 1 capsule (100 mg total) by mouth at bedtime.  Dispense: 30 capsule; Refill: 1   Orders Placed This Encounter  Procedures   CoaguChek XS/INR Waived   No orders of the defined types were placed in this encounter.    [name], DO [place] [phone]

## 2019-09-14 ENCOUNTER — Other Ambulatory Visit: Payer: Self-pay

## 2019-09-15 ENCOUNTER — Other Ambulatory Visit: Payer: Self-pay

## 2019-09-15 ENCOUNTER — Encounter (HOSPITAL_COMMUNITY): Payer: Self-pay

## 2019-09-15 ENCOUNTER — Encounter: Payer: Self-pay | Admitting: Family Medicine

## 2019-09-15 ENCOUNTER — Inpatient Hospital Stay (HOSPITAL_COMMUNITY)
Admission: EM | Admit: 2019-09-15 | Discharge: 2019-09-20 | DRG: 177 | Disposition: A | Discharge status: Home Health | Payer: Medicare Other | Attending: Family Medicine | Admitting: Family Medicine

## 2019-09-15 ENCOUNTER — Emergency Department (HOSPITAL_COMMUNITY): Payer: Medicare Other

## 2019-09-15 ENCOUNTER — Ambulatory Visit (INDEPENDENT_AMBULATORY_CARE_PROVIDER_SITE_OTHER): Payer: Medicare Other | Admitting: Family Medicine

## 2019-09-15 VITALS — BP 100/66 | HR 90 | Temp 97.0°F | Ht 72.0 in | Wt 164.0 lb

## 2019-09-15 DIAGNOSIS — Z7901 Long term (current) use of anticoagulants: Secondary | ICD-10-CM

## 2019-09-15 DIAGNOSIS — J309 Allergic rhinitis, unspecified: Secondary | ICD-10-CM | POA: Diagnosis present

## 2019-09-15 DIAGNOSIS — N401 Enlarged prostate with lower urinary tract symptoms: Secondary | ICD-10-CM | POA: Diagnosis not present

## 2019-09-15 DIAGNOSIS — N400 Enlarged prostate without lower urinary tract symptoms: Secondary | ICD-10-CM | POA: Diagnosis present

## 2019-09-15 DIAGNOSIS — Z823 Family history of stroke: Secondary | ICD-10-CM

## 2019-09-15 DIAGNOSIS — J9601 Acute respiratory failure with hypoxia: Secondary | ICD-10-CM | POA: Diagnosis not present

## 2019-09-15 DIAGNOSIS — I10 Essential (primary) hypertension: Secondary | ICD-10-CM | POA: Diagnosis present

## 2019-09-15 DIAGNOSIS — R935 Abnormal findings on diagnostic imaging of other abdominal regions, including retroperitoneum: Secondary | ICD-10-CM | POA: Diagnosis not present

## 2019-09-15 DIAGNOSIS — I495 Sick sinus syndrome: Secondary | ICD-10-CM | POA: Diagnosis present

## 2019-09-15 DIAGNOSIS — E785 Hyperlipidemia, unspecified: Secondary | ICD-10-CM | POA: Diagnosis present

## 2019-09-15 DIAGNOSIS — Z79890 Hormone replacement therapy (postmenopausal): Secondary | ICD-10-CM

## 2019-09-15 DIAGNOSIS — F329 Major depressive disorder, single episode, unspecified: Secondary | ICD-10-CM | POA: Diagnosis present

## 2019-09-15 DIAGNOSIS — G609 Hereditary and idiopathic neuropathy, unspecified: Secondary | ICD-10-CM | POA: Diagnosis present

## 2019-09-15 DIAGNOSIS — Z87891 Personal history of nicotine dependence: Secondary | ICD-10-CM

## 2019-09-15 DIAGNOSIS — F4320 Adjustment disorder, unspecified: Secondary | ICD-10-CM | POA: Diagnosis present

## 2019-09-15 DIAGNOSIS — Z79899 Other long term (current) drug therapy: Secondary | ICD-10-CM

## 2019-09-15 DIAGNOSIS — L7632 Postprocedural hematoma of skin and subcutaneous tissue following other procedure: Secondary | ICD-10-CM | POA: Diagnosis present

## 2019-09-15 DIAGNOSIS — K5900 Constipation, unspecified: Secondary | ICD-10-CM | POA: Diagnosis present

## 2019-09-15 DIAGNOSIS — Z880 Allergy status to penicillin: Secondary | ICD-10-CM

## 2019-09-15 DIAGNOSIS — Z8673 Personal history of transient ischemic attack (TIA), and cerebral infarction without residual deficits: Secondary | ICD-10-CM

## 2019-09-15 DIAGNOSIS — R791 Abnormal coagulation profile: Secondary | ICD-10-CM | POA: Diagnosis not present

## 2019-09-15 DIAGNOSIS — I482 Chronic atrial fibrillation, unspecified: Secondary | ICD-10-CM

## 2019-09-15 DIAGNOSIS — R0902 Hypoxemia: Secondary | ICD-10-CM

## 2019-09-15 DIAGNOSIS — Z888 Allergy status to other drugs, medicaments and biological substances status: Secondary | ICD-10-CM

## 2019-09-15 DIAGNOSIS — H409 Unspecified glaucoma: Secondary | ICD-10-CM | POA: Diagnosis present

## 2019-09-15 DIAGNOSIS — Z951 Presence of aortocoronary bypass graft: Secondary | ICD-10-CM | POA: Diagnosis not present

## 2019-09-15 DIAGNOSIS — R918 Other nonspecific abnormal finding of lung field: Secondary | ICD-10-CM

## 2019-09-15 DIAGNOSIS — E559 Vitamin D deficiency, unspecified: Secondary | ICD-10-CM | POA: Diagnosis present

## 2019-09-15 DIAGNOSIS — K219 Gastro-esophageal reflux disease without esophagitis: Secondary | ICD-10-CM | POA: Diagnosis present

## 2019-09-15 DIAGNOSIS — F32A Depression, unspecified: Secondary | ICD-10-CM | POA: Diagnosis present

## 2019-09-15 DIAGNOSIS — U071 COVID-19: Secondary | ICD-10-CM | POA: Diagnosis not present

## 2019-09-15 DIAGNOSIS — F321 Major depressive disorder, single episode, moderate: Secondary | ICD-10-CM | POA: Diagnosis not present

## 2019-09-15 DIAGNOSIS — I700 Atherosclerosis of aorta: Secondary | ICD-10-CM | POA: Diagnosis not present

## 2019-09-15 DIAGNOSIS — E039 Hypothyroidism, unspecified: Secondary | ICD-10-CM | POA: Diagnosis present

## 2019-09-15 DIAGNOSIS — J1282 Pneumonia due to coronavirus disease 2019: Secondary | ICD-10-CM | POA: Diagnosis present

## 2019-09-15 DIAGNOSIS — Z8249 Family history of ischemic heart disease and other diseases of the circulatory system: Secondary | ICD-10-CM

## 2019-09-15 DIAGNOSIS — E782 Mixed hyperlipidemia: Secondary | ICD-10-CM | POA: Diagnosis present

## 2019-09-15 DIAGNOSIS — R351 Nocturia: Secondary | ICD-10-CM | POA: Diagnosis not present

## 2019-09-15 DIAGNOSIS — J1289 Other viral pneumonia: Secondary | ICD-10-CM | POA: Diagnosis present

## 2019-09-15 DIAGNOSIS — R05 Cough: Secondary | ICD-10-CM | POA: Diagnosis not present

## 2019-09-15 DIAGNOSIS — I2510 Atherosclerotic heart disease of native coronary artery without angina pectoris: Secondary | ICD-10-CM | POA: Diagnosis present

## 2019-09-15 LAB — BASIC METABOLIC PANEL
Anion gap: 9 (ref 5–15)
BUN: 18 mg/dL (ref 8–23)
CO2: 25 mmol/L (ref 22–32)
Calcium: 9.2 mg/dL (ref 8.9–10.3)
Chloride: 101 mmol/L (ref 98–111)
Creatinine, Ser: 0.9 mg/dL (ref 0.61–1.24)
GFR calc Af Amer: >60 mL/min (ref >60)
GFR calc non Af Amer: >60 mL/min (ref >60)
Glucose, Bld: 121 mg/dL — ABNORMAL HIGH (ref 70–99)
Potassium: 3.6 mmol/L (ref 3.5–5.1)
Sodium: 135 mmol/L (ref 135–145)

## 2019-09-15 LAB — HEMOGLOBIN, FINGERSTICK: Hemoglobin: 12 g/dL — ABNORMAL LOW (ref 12.6–17.7)

## 2019-09-15 LAB — TROPONIN I (HIGH SENSITIVITY): Troponin I (High Sensitivity): 13 ng/L (ref <18)

## 2019-09-15 LAB — COAGUCHEK XS/INR WAIVED
INR: 7.7 (ref 0.9–1.1)
Prothrombin Time: 91.9 s

## 2019-09-15 LAB — TYPE AND SCREEN
ABO/RH(D): O NEG
Antibody Screen: NEGATIVE

## 2019-09-15 LAB — CBC
HCT: 39.2 % (ref 39.0–52.0)
Hemoglobin: 12.5 g/dL — ABNORMAL LOW (ref 13.0–17.0)
MCH: 29.3 pg (ref 26.0–34.0)
MCHC: 31.9 g/dL (ref 30.0–36.0)
MCV: 92 fL (ref 80.0–100.0)
Platelets: 225 10*3/uL (ref 150–400)
RBC: 4.26 MIL/uL (ref 4.22–5.81)
RDW: 14.4 % (ref 11.5–15.5)
WBC: 8 10*3/uL (ref 4.0–10.5)
nRBC: 0 % (ref 0.0–0.2)

## 2019-09-15 LAB — PROTIME-INR
INR: 6.1 (ref 0.8–1.2)
Prothrombin Time: 53.1 seconds — ABNORMAL HIGH (ref 11.4–15.2)

## 2019-09-15 LAB — SARS CORONAVIRUS 2 BY RT PCR (HOSPITAL ORDER, PERFORMED IN ~~LOC~~ HOSPITAL LAB): SARS Coronavirus 2: POSITIVE — AB

## 2019-09-15 LAB — PROCALCITONIN: Procalcitonin: <0.1 ng/mL

## 2019-09-15 LAB — OCCULT BLOOD, POC DEVICE: Fecal Occult Bld: NEGATIVE

## 2019-09-15 LAB — FIBRINOGEN: Fibrinogen: 769 mg/dL — ABNORMAL HIGH (ref 210–475)

## 2019-09-15 LAB — C-REACTIVE PROTEIN: CRP: 9.8 mg/dL — ABNORMAL HIGH (ref <1.0)

## 2019-09-15 LAB — D-DIMER, QUANTITATIVE: D-Dimer, Quant: 1.21 ug/mL-FEU — ABNORMAL HIGH (ref 0.00–0.50)

## 2019-09-15 LAB — FERRITIN: Ferritin: 855 ng/mL — ABNORMAL HIGH (ref 24–336)

## 2019-09-15 MED ORDER — MIRTAZAPINE 15 MG PO TABS
15.0000 mg | ORAL_TABLET | Freq: Every day | ORAL | Status: DC
  Administered 2019-09-16 – 2019-09-19 (×5): 15 mg via ORAL
  Filled 2019-09-15 (×5): qty 1

## 2019-09-15 MED ORDER — PHYTONADIONE 5 MG PO TABS
5.0000 mg | ORAL_TABLET | Freq: Once | ORAL | Status: AC
  Administered 2019-09-15: 5 mg via ORAL
  Filled 2019-09-15: qty 1

## 2019-09-15 MED ORDER — DEXAMETHASONE 4 MG PO TABS
6.0000 mg | ORAL_TABLET | Freq: Once | ORAL | Status: AC
  Administered 2019-09-15: 6 mg via ORAL
  Filled 2019-09-15: qty 2

## 2019-09-15 MED ORDER — IOHEXOL 300 MG/ML  SOLN
75.0000 mL | Freq: Once | INTRAMUSCULAR | Status: AC | PRN
  Administered 2019-09-15: 75 mL via INTRAVENOUS

## 2019-09-15 NOTE — ED Notes (Signed)
Report given to Adult nurse at [facility]

## 2019-09-15 NOTE — H&P (Addendum)
History and Physical    Dalton Vaughan IFO:[id number] DOB: [birth year] DOA: 09/15/2019  PCP: [name], DO   Patient coming from: Home  I have personally briefly reviewed patient's old medical records in [place]  Chief Complaint: SOB, cough  HPI: Dalton Vaughan is a 85 y.o. male with medical history significant for atrial fibrillation with sick sinus syndrome, congestive heart failure, hypertension and coronary artery disease, patient presented to the ED with complaints of difficulty breathing or cough present over the past week and a half..  He also reports generalized weakness and weakness when walking since his recent knee surgery a few weeks ago at [facility].  Patient lives alone.  Patient went to see his primary care provider today, his INR was elevated at 8, 20 point drop in his blood pressure from last check, and concern for unidentified bleeding given his overall weakness, he was then referred to the ED. Patient reports his last dose of warfarin was yesterday.  ED Course: Blood pressure systolic 676H to 209O, O2 sats greater than 93% on room air, but with ambulation his O2 sats dropped to 88%.  Hemoglobin stable at 12.5.  WBC 8.  INR 6.1.  High-sensitivity troponin- 13.  2 view chest x-German-no acute cardiopulmonary disease.  CT chest with contrast showed patchy ground-glass infiltrates bilaterally without associated effusion, this could represent an atypical pneumonia, consideration for COVID-19 infection.  Hospitalist called to admit for above problems.  Review of Systems: As per HPI all other systems reviewed and negative.  Past Medical History:  Diagnosis Date   Anal fissure    Atrial fibrillation (HCC)    BPH (benign prostatic hypertrophy)    CAD (coronary artery disease)    Dr. Wynonia Lawman   Cataract    Depression 2004   Diverticulosis    GERD (gastroesophageal reflux disease)    History of TIAs    Hyperlipidemia    Hypertension    Hypertensive  heart disease without CHF    Hypothyroidism    Internal hemorrhoids    Lumbar disc disease    Oral bleeding 08/20/2012   Following molar tooth extraction July, 2013   Ruptured disk 1995   Shingles    Thyroid disease    Vitamin D deficiency    Vitreous detachment     Past Surgical History:  Procedure Laterality Date   bilateral cataract surgery  6/07   CARDIAC CATHETERIZATION     CORONARY ARTERY BYPASS GRAFT  12/99    Dr. Servando Snare    cysloscopy  8/08   [place]   right   herninated disc  1995   PROSTATE BIOPSY  8/08   TONSILLECTOMY AND ADENOIDECTOMY       reports that he quit smoking about 52 years ago. His smoking use included cigarettes. He started smoking about 70 years ago. He has a 18.00 pack-year smoking history. He has never used smokeless tobacco. He reports current alcohol use of about 1.0 standard drinks of alcohol per week. He reports that he does not use drugs.  Allergies  Allergen Reactions   Paxil [Paroxetine Hcl] Palpitations   Eliquis [Apixaban] Other (See Comments)    dizziness   Penicillins     Said he is  Allergic" Natural combinations-extended release   Pravachol [Pravastatin Sodium]    Zetia [Ezetimibe]     myaalgias   Vytorin [Ezetimibe-Simvastatin] Other (See Comments)    Family History  Problem Relation Age of Onset  Dementia Mother    Arthritis Mother    Cancer Brother        prostate   Asthma Sister    Heart disease Paternal Aunt    Heart disease Paternal Uncle    Hypertension Maternal Grandmother    CVA Maternal Grandmother    CVA Paternal Grandmother    Hepatitis C Son    Arthritis Son    Colon cancer Neg Hx    Colon polyps Neg Hx    Kidney disease Neg Hx    Esophageal cancer Neg Hx    Gallbladder disease Neg Hx    Diabetes Neg Hx     Prior to Admission medications   Medication Sig Start Date End Date Taking? Authorizing Provider  amLODipine (NORVASC) 5 MG  tablet Take 1 tablet (5 mg total) by mouth daily. 05/06/19  Yes Chipper Herb, MD  doxazosin (CARDURA) 8 MG tablet Take 1 tablet (8 mg total) by mouth at bedtime. 05/06/19  Yes Chipper Herb, MD  gabapentin (NEURONTIN) 100 MG capsule Take 1 capsule (100 mg total) by mouth at bedtime. 09/09/19  Yes Ronnie Doss M, DO  levothyroxine (SYNTHROID) 50 MCG tablet TAKE 1 TABLET DAILY Patient taking differently: Take 50 mcg by mouth daily before breakfast.  06/25/19  Yes Gottschalk, Ashly M, DO  mirtazapine (REMERON) 15 MG tablet Take 1 tablet (15 mg total) by mouth at bedtime. 09/09/19  Yes Gottschalk, Leatrice Jewels M, DO  ramipril (ALTACE) 10 MG capsule Take 1 capsule (10 mg total) by mouth daily. 07/28/19  Yes Gottschalk, Leatrice Jewels M, DO  rosuvastatin (CRESTOR) 5 MG tablet Take 1 tablet (5 mg total) by mouth daily. as directed 05/06/19  Yes Chipper Herb, MD  triamterene-hydrochlorothiazide (MAXZIDE-25) 37.5-25 MG tablet Take 1 tablet by mouth daily. 07/17/19  Yes [provider]  warfarin (COUMADIN) 5 MG tablet TAKE AS DIRECTED Patient taking differently: Take 5 mg by mouth as directed.  09/24/18  Yes Chipper Herb, MD  XIIDRA 5 % SOLN Apply 1 drop to eye daily.  07/17/19  Yes [provider]  Calcium Carb-Cholecalciferol (CALCIUM + D3) 600-200 MG-UNIT TABS Take 0.25 tablets by mouth daily.    [provider]  Cholecalciferol (VITAMIN D-3) 5000 UNITS TABS Take 1 tablet by mouth daily.     [provider]  furosemide (LASIX) 40 MG tablet Take 1 tablet (40 mg total) by mouth daily. 11/17/18   Hassell Done Mary-Margaret, FNP  HYDROcodone-acetaminophen (NORCO/VICODIN) 5-325 MG tablet Take 1 tablet by mouth every 4 (four) hours as needed. Patient not taking: Reported on 09/15/2019 08/16/19   Evalee Jefferson, PA-C  HYDROcodone-acetaminophen (NORCO/VICODIN) 5-325 MG tablet Take 1 tablet by mouth every 4 (four) hours as needed. 08/16/19   Evalee Jefferson, PA-C  Multiple Vitamin (MULTIVITAMIN) tablet  Take 1 tablet by mouth daily.      [provider]  olopatadine (PATANOL) 0.1 % ophthalmic solution Place 1 drop into both eyes 2 (two) times daily.  07/17/19   [provider]  oxyCODONE (OXY IR/ROXICODONE) 5 MG immediate release tablet Take 5 mg by mouth every 4 (four) hours as needed for moderate pain or severe pain.  08/29/19   [provider]  vitamin C (ASCORBIC ACID) 500 MG tablet Take 500 mg by mouth daily. Take 1/4 tablet by mouth daily.    [provider]    Physical Exam: Vitals:   09/15/19 1700 09/15/19 1730 09/15/19 1800 09/15/19 1830  BP: 114/71 136/76 121/79 129/73  Pulse:  84  68 63  Resp: (!) 21 20 (!) 22 19  Temp:      TempSrc:      SpO2:  93% 93% 93%  Weight:        Constitutional: NAD, calm, comfortable Vitals:   09/15/19 1700 09/15/19 1730 09/15/19 1800 09/15/19 1830  BP: 114/71 136/76 121/79 129/73  Pulse:  84 68 63  Resp: (!) 21 20 (!) 22 19  Temp:      TempSrc:      SpO2:  93% 93% 93%  Weight:       Eyes: PERRL, lids and conjunctivae normal ENMT: Mucous membranes are moist. Posterior pharynx clear of any exudate or lesions  Neck: normal, supple, no masses, no thyromegaly Respiratory: clear to auscultation bilaterally, no wheezing, no crackles. Normal respiratory effort. No accessory muscle use.  Cardiovascular: irregular rate and rhythm, no murmurs / rubs / gallops. No extremity edema. 2+ pedal pulses.  Abdomen: no tenderness, no masses palpated. No hepatosplenomegaly. Bowel sounds positive.  Musculoskeletal: no clubbing / cyanosis. No joint deformity upper and lower extremities. Good ROM, no contractures. Normal muscle tone.  Right knee mildly warm compared to left, without swelling. Skin: no rashes, lesions, ulcers. No induration.  Neurologic: CN 2-12 grossly intact.  Strength 5/5 in all 4.  Psychiatric: Normal judgment and insight. Alert and oriented x 3. Normal mood.   Labs on Admission: I have personally reviewed  following labs and imaging studies  CBC: Recent Labs  Lab 09/15/19 1417  WBC 8.0  HGB 12.5*  HCT 39.2  MCV 92.0  PLT 818   Basic Metabolic Panel: Recent Labs  Lab 09/15/19 1417  NA 135  K 3.6  CL 101  CO2 25  GLUCOSE 121*  BUN 18  CREATININE 0.90  CALCIUM 9.2   Coagulation Profile: Recent Labs  Lab 09/09/19 09/09/19 1521 09/15/19 1123 09/15/19 1417  INR 4.5* 4.5* 7.7* 6.1*   BNP (last 3 results) Recent Labs    11/07/18 1615  PROBNP 2,681*    Radiological Exams on Admission: Dg Chest 2 View  Result Date: 09/15/2019 CLINICAL DATA:  85 year old presenting with acute onset of cough. EXAM: CHEST - 2 VIEW COMPARISON:  12/01/2018 and earlier. FINDINGS: AP ERECT and LATERAL images were obtained. Prior sternotomy for CABG. Cardiac silhouette mildly to moderately enlarged, unchanged. Thoracic aorta tortuous and atherosclerotic, unchanged. Hilar and mediastinal contours otherwise unremarkable. Mild linear atelectasis in the RIGHT MIDDLE LOBE. Lungs otherwise clear. Pulmonary vascularity normal. Bronchovascular markings normal. No pleural effusions. Degenerative changes involving the thoracic and visualized lumbar spine. IMPRESSION: 1. Mild linear atelectasis in the RIGHT MIDDLE LOBE. No acute cardiopulmonary disease otherwise. 2. Stable cardiomegaly without pulmonary edema. Electronically Signed   By: Evangeline Dakin M.D.   On: 09/15/2019 14:52   Ct Chest W Contrast  Result Date: 09/15/2019 CLINICAL DATA:  Cough for several weeks with shortness of Breath EXAM: CT CHEST WITH CONTRAST TECHNIQUE: Multidetector CT imaging of the chest was performed during intravenous contrast administration. CONTRAST:  37m OMNIPAQUE IOHEXOL 300 MG/ML  SOLN COMPARISON:  Chest x-Ada from earlier in the same day, CT of the abdomen and pelvis from 08/08/2007 and CT of the chest from 02/26/2011. FINDINGS: Cardiovascular: Atherosclerotic changes of the thoracic aorta are noted. No aneurysmal dilatation is  seen. Mild cardiomegaly is noted. Postsurgical changes consistent with coronary bypass grafting are noted. Native coronary calcifications are seen. Mediastinum/Nodes: Thoracic inlet is within normal limits. No sizable hilar or mediastinal adenopathy is noted. The esophagus is within normal  limits. Lungs/Pleura: Lungs are well aerated bilaterally. Some scattered peripheral ground-glass opacities are identified within both lungs. No sizable effusion or pneumothorax is seen. No sizable nodules are noted. Upper Abdomen: Visualized upper abdomen is within normal limits. Stable dilatation of the collecting system is noted when compared with 2008. Musculoskeletal: Degenerative changes of the thoracic spine are noted. IMPRESSION: Patchy ground-glass infiltrates identified bilaterally without associated effusion. This could represent an atypical pneumonia although the possibility of COVID-19 deserves consideration. Correlation with testing is recommended. No other acute abnormality is noted. Aortic Atherosclerosis (ICD10-I70.0). Electronically Signed   By: Inez Catalina M.D.   On: 09/15/2019 16:38    EKG: ATRIAL fibrillation, no significant ST or T wave changes compared to prior.  Assessment/Plan Active Problems:   Pneumonia due to COVID-19 virus   COVID-19 pneumonia- afebrile, mild leukocytosis WBC 12.5.  O2 sats greater 93% on room air, drops to 88% with ambulation, with generalized weakness.  Patient rules out for sepsis.  CT chest shows patchy groundglass infiltrates bilaterally.  COVID-19 test. - Duo nebs, mucolytics PRN - Supplemental O2 -D-dimer, ferritin, fibrinogen, procalcitonin, CRP check - As patient is borderline hypoxic, will give dexamethasone at 65m X 1, pending clinical course consider subsequent dosing.  [facility]  Atrial fibrillation with supratherapeutic INR-6.1.  On warfarin for atrial fibrillation.  Not on rate  limiting medications.  Hemoglobin stable.  Vitals stable.  No obvious sign of bleeding at this time. -Considering his age, he is high risk for bleeding and poor outcome, will give Po Vit K 567m 1 -Hold warfarin -Daily CBC, INR  Congestive heart failure-stable and compensated.  Echo 60 to 65%. -Med reconciliation pending, for now hold Lasix for now, Maxide, ramipril, in the event of bleeding in the setting of supra-therapeutic INR  Hypertension-stable. -Med reconciliation pending, hold home Norvasc, doxazosin, Lasix, ramipril, Maxzide, consider resuming in a.m. pending stable blood pressure.  Coronary artery disease-stable. -Resume home Crestor pending med reconciliation.   DVT prophylaxis: SCDs Code Status: Full code Family Communication: None at bedside Disposition Plan: Per rounding team Consults called: None Admission status: Inpatient, telemetry I certify that at the point of admission it is my clinical judgment that the patient will require inpatient hospital care spanning beyond 2 midnights from the point of admission due to high intensity of service, high risk for further deterioration and high frequency of surveillance required. The following factors support the patient status of inpatient: COVID-19 pneumonia, requiring supplemental O2 with ambulation, and generalized weakness.   EjBethena RoysD [facility]  09/15/2019, 9:36 PM

## 2019-09-15 NOTE — ED Notes (Addendum)
Pt ambulated with walker in room and denies shortness of breath or weakness. Pt reports "I only get weak when I start coughing." Pt HR ranged from 144-125, o2 sat ranged 89-92. Primary RN aware. Pt back in bed, nad noted.

## 2019-09-15 NOTE — Progress Notes (Signed)
Pharmacy Antibiotic Note  Dalton Vaughan is a 85 y.o. male admitted on 09/15/2019 with COVID 19.  Pharmacy has been consulted for Remdesivir dosing.  CT chest shows patchy ground-glass infiltrates identified bilaterally without associated effusion Pt requiring supplemental oxygen (2L ) ALT 14  Plan: Remdesivir 221m IV now then 1056mIV daily x 4 days Will f/u ALT and pt's clinical condition   Weight: 164 lb (74.4 kg)  Temp (24hrs), Avg:97.4 F (36.3 C), Min:97 F (36.1 C), Max:97.7 F (36.5 C)  Recent Labs  Lab 09/15/19 1417  WBC 8.0  CREATININE 0.90    Estimated Creatinine Clearance: 59.7 mL/min (by C-G formula based on SCr of 0.9 mg/dL).    Allergies  Allergen Reactions  . Paxil [Paroxetine Hcl] Palpitations  . Eliquis [Apixaban] Other (See Comments)    dizziness  . Penicillins     Said he is  Allergic" Natural combinations-extended release  . Pravachol [Pravastatin Sodium]   . Zetia [Ezetimibe]     myaalgias  . Vytorin [Ezetimibe-Simvastatin] Other (See Comments)    Antimicrobials this admission: 9/30 Remdesivir >>   Thank you for allowing pharmacy to be a part of this patient's care.  CaSherlon HandingPharmD, BCPS CGV Clinical pharmacist phone [phone]/29/2020 11:47 PM

## 2019-09-15 NOTE — Progress Notes (Signed)
Patient arrived to unit 2 L [place]

## 2019-09-15 NOTE — ED Provider Notes (Signed)
[facility] EMERGENCY DEPARTMENT Provider Note   CSN: [id number] Arrival date & time: 09/15/19  1305     History   Chief Complaint Chief Complaint  Patient presents with  . Abnormal Lab    HPI Dalton Vaughan is a 85 y.o. male with h/o chronic atrial fibrillation on coumadin, HLD, CAD, HTN, recent right knee arthroscopy with debridement 08/28/2019, hypothyroidism referred to ER by PCP for evaluation of elevated INR of 8 in office today with associated weakness. Pt states ever since his surgery he has had decreased appetite, fatigue, generalized weakness, shortness of breath and a persistent intermittently productive cough. He feels weak with walking and can walk only walk shorter distances before he feels "weak".  Has chronic exertional shortness of breath for years and states now shorter distances are causing more shortness of breath.   No exertional CP but occasionally gets sharp central chest pains while at rest and on exertion, none recently.  He has increased one pillow to sleep now using three.  After his surgery he was told his INR was high so doctors gave them plasma.  His PCP has been adjusting his coumadin to normalize his INR.  At one point INR was 4. He has been taking half a tablet every day.  He went to his PCP today who told him his INR was too high and he could be bleeding from somewhere.  He denies bloody sputum, epistaxis, hematuria, black stools, blood in stools.   He denies fever, congestion, sore throat, vomiting, diarrhea, abdominal pain, chills.  No exposure to COVID-19. No travel. Has been constipated since surgery.  Smoked cigarettes for 20 years, quit in the late 60s. No LE edema, calf pain.    HPI  Past Medical History:  Diagnosis Date  . Anal fissure   . Atrial fibrillation ([place])   . BPH (benign prostatic hypertrophy)   . CAD (coronary artery disease)    Dr. Wynonia Lawman  . Cataract   . Depression 2004  . Diverticulosis   . GERD (gastroesophageal reflux disease)    . History of TIAs   . Hyperlipidemia   . Hypertension   . Hypertensive heart disease without CHF   . Hypothyroidism   . Internal hemorrhoids   . Lumbar disc disease   . Oral bleeding 08/20/2012   Following molar tooth extraction July, 2013  . Ruptured disk 1995  . Shingles   . Thyroid disease   . Vitamin D deficiency   . Vitreous detachment     Patient Active Problem List   Diagnosis Date Noted  . Osteoarthritis of spine with radiculopathy, lumbar region 01/08/2018  . Thyroid disease   . Aortic atherosclerosis ([place]) 05/02/2017  . Thrombocytopenia ([place]) 07/10/2016  . Hereditary and idiopathic peripheral neuropathy 10/16/2015  . Mononeuropathy 10/04/2015  . Allergic rhinitis 05/06/2014  . Depression 08/03/2013  . Sick sinus syndrome ([place]) 07/31/2012  . Hypertensive heart disease without CHF   . Chronic atrial fibrillation ([place])   . Coronary artery disease involving native coronary artery of native heart with angina pectoris ([place])   . History of TIAs   . Lumbar disc disease   . GERD (gastroesophageal reflux disease)   . Chronic anticoagulation   . Benign prostatic hyperplasia   . Hyperlipidemia     Past Surgical History:  Procedure Laterality Date  . bilateral cataract surgery  6/07  . CARDIAC CATHETERIZATION    . CORONARY ARTERY BYPASS GRAFT  12/99    Dr. Servando Snare   . cysloscopy  8/08  . EYE SURGERY    . HERNIA REPAIR  1997   right  . herninated disc  1995  . PROSTATE BIOPSY  8/08  . TONSILLECTOMY AND ADENOIDECTOMY          Home Medications    Prior to Admission medications   Medication Sig Start Date End Date Taking? Authorizing Provider  amLODipine (NORVASC) 5 MG tablet Take 1 tablet (5 mg total) by mouth daily. 05/06/19   Chipper Herb, MD  Calcium Carb-Cholecalciferol (CALCIUM + D3) 600-200 MG-UNIT TABS Take 0.25 tablets by mouth daily.    [provider]  Cholecalciferol (VITAMIN D-3) 5000 UNITS TABS Take 1 tablet by mouth daily.     [provider]  doxazosin (CARDURA) 8 MG tablet Take 1 tablet (8 mg total) by mouth at bedtime. 05/06/19   Chipper Herb, MD  Elastic Bandages & Supports (MEDICAL COMPRESSION SOCKS) MISC 1 each by Does not apply route daily. 11/25/18   Hassell Done, Mary-Margaret, FNP  furosemide (LASIX) 40 MG tablet Take 1 tablet (40 mg total) by mouth daily. 11/17/18   Hassell Done, Mary-Margaret, FNP  gabapentin (NEURONTIN) 100 MG capsule Take 1 capsule (100 mg total) by mouth at bedtime. 09/09/19   [name], DO  HYDROcodone-acetaminophen (NORCO/VICODIN) 5-325 MG tablet Take 1 tablet by mouth every 4 (four) hours as needed. 08/16/19   Evalee Jefferson, PA-C  HYDROcodone-acetaminophen (NORCO/VICODIN) 5-325 MG tablet Take 1 tablet by mouth every 4 (four) hours as needed. 08/16/19   Evalee Jefferson, PA-C  levothyroxine (SYNTHROID) 50 MCG tablet TAKE 1 TABLET DAILY 06/25/19   Ronnie Doss M, DO  mirtazapine (REMERON) 15 MG tablet Take 1 tablet (15 mg total) by mouth at bedtime. 09/09/19   [name], DO  Multiple Vitamin (MULTIVITAMIN) tablet Take 1 tablet by mouth daily.      [provider]  olopatadine (PATANOL) 0.1 % ophthalmic solution  07/17/19   [provider]  oxyCODONE (OXY IR/ROXICODONE) 5 MG immediate release tablet TAKE 1 TABLET BY MOUTH EVERY 4 HOURS AS NEEDED FOR moderate pain (score 4-6) 08/29/19   [provider]  ramipril (ALTACE) 10 MG capsule Take 1 capsule (10 mg total) by mouth daily. 07/28/19   [name], DO  rosuvastatin (CRESTOR) 5 MG tablet Take 1 tablet (5 mg total) by mouth daily. as directed 05/06/19   Chipper Herb, MD  triamterene-hydrochlorothiazide (MAXZIDE-25) 37.5-25 MG tablet Take 1 tablet by mouth daily. 07/17/19   [provider]  vitamin C (ASCORBIC ACID) 500 MG tablet Take 500 mg by mouth daily. Take 1/4 tablet by mouth daily.    [provider]  warfarin (COUMADIN) 5 MG tablet TAKE AS DIRECTED 09/24/18   Chipper Herb, MD   XIIDRA 5 % SOLN  07/17/19   [provider]    Family History Family History  Problem Relation Age of Onset  . Dementia Mother   . Arthritis Mother   . Cancer Brother        prostate  . Asthma Sister   . Heart disease Paternal Aunt   . Heart disease Paternal Uncle   . Hypertension Maternal Grandmother   . CVA Maternal Grandmother   . CVA Paternal Grandmother   . Hepatitis C Son   . Arthritis Son   . Colon cancer Neg Hx   . Colon polyps Neg Hx   . Kidney disease Neg Hx   . Esophageal cancer Neg Hx   . Gallbladder disease Neg  Hx   . Diabetes Neg Hx     Social History Social History   Tobacco Use  . Smoking status: Former Smoker    Packs/day: 1.00    Years: 18.00    Pack years: 18.00    Types: Cigarettes    Start date: 12/17/1948    Quit date: 12/17/1966    Years since quitting: 52.7  . Smokeless tobacco: Never Used  Substance Use Topics  . Alcohol use: Yes    Alcohol/week: 1.0 standard drinks    Types: 1 Standard drinks or equivalent per week    Comment: Occasionally  . Drug use: No     Allergies   Paxil [paroxetine hcl], Eliquis [apixaban], Penicillins, Pravachol [pravastatin sodium], Zetia [ezetimibe], and Vytorin [ezetimibe-simvastatin]   Review of Systems Review of Systems  Constitutional: Positive for fatigue.  Respiratory: Positive for cough and shortness of breath.   Musculoskeletal: Positive for arthralgias (right knee pain s/p surgery 9/11).  Hematological: Bruises/bleeds easily.  All other systems reviewed and are negative.    Physical Exam Updated Vital Signs BP 121/79   Pulse 68   Temp 97.7 F (36.5 C) (Oral)   Resp (!) 22   Wt 74.4 kg   SpO2 93%   BMI 22.24 kg/m   Physical Exam Vitals signs and nursing note reviewed.  Constitutional:      Appearance: He is well-developed.     Comments: Non toxic.  Appears tired.   HENT:     Head: Normocephalic and atraumatic.     Nose: Nose normal.  Eyes:     Conjunctiva/sclera:  Conjunctivae normal.  Neck:     Musculoskeletal: Normal range of motion.  Cardiovascular:     Rate and Rhythm: Normal rate. Rhythm irregular.     Heart sounds: Normal heart sounds.     Comments: 1+ DP and radial pulses bilaterally. No LE edema. No calf tenderness. Irregularly irregular. HR <100.  Pulmonary:     Effort: Pulmonary effort is normal.     Breath sounds: Normal breath sounds.     Comments: Spo2 92% when I enter the room, good pleth.  Improved to 94-96% on RA at rest. No crackles or wheezing.  Abdominal:     General: Bowel sounds are normal.     Palpations: Abdomen is soft.     Tenderness: There is no abdominal tenderness.     Comments: No G/R/R. No suprapubic or CVA tenderness. Negative Murphy's and McBurney's  Musculoskeletal: Normal range of motion.  Skin:    General: Skin is warm and dry.     Capillary Refill: Capillary refill takes less than 2 seconds.  Neurological:     Mental Status: He is alert.     Comments: Sensation and strength intact in upper/lower extremities   Psychiatric:        Behavior: Behavior normal.      ED Treatments / Results  Labs (all labs ordered are listed, but only abnormal results are displayed) Labs Reviewed  CBC - Abnormal; Notable for the following components:      Result Value   Hemoglobin 12.5 (*)    All other components within normal limits  BASIC METABOLIC PANEL - Abnormal; Notable for the following components:   Glucose, Bld 121 (*)    All other components within normal limits  PROTIME-INR - Abnormal; Notable for the following components:   Prothrombin Time 53.1 (*)    INR 6.1 (*)    All other components within normal limits  SARS CORONAVIRUS 2 (  HOSPITAL ORDER, [place] [facility])  POC OCCULT BLOOD, ED  TYPE AND SCREEN  TROPONIN I (HIGH SENSITIVITY)    EKG None  Radiology Dg Chest 2 View  Result Date: 09/15/2019 CLINICAL DATA:  85 year old presenting with acute onset of cough. EXAM: CHEST - 2  VIEW COMPARISON:  12/01/2018 and earlier. FINDINGS: AP ERECT and LATERAL images were obtained. Prior sternotomy for CABG. Cardiac silhouette mildly to moderately enlarged, unchanged. Thoracic aorta tortuous and atherosclerotic, unchanged. Hilar and mediastinal contours otherwise unremarkable. Mild linear atelectasis in the RIGHT MIDDLE LOBE. Lungs otherwise clear. Pulmonary vascularity normal. Bronchovascular markings normal. No pleural effusions. Degenerative changes involving the thoracic and visualized lumbar spine. IMPRESSION: 1. Mild linear atelectasis in the RIGHT MIDDLE LOBE. No acute cardiopulmonary disease otherwise. 2. Stable cardiomegaly without pulmonary edema. Electronically Signed   By: Evangeline Dakin M.D.   On: 09/15/2019 14:52   Ct Chest W Contrast  Result Date: 09/15/2019 CLINICAL DATA:  Cough for several weeks with shortness of Breath EXAM: CT CHEST WITH CONTRAST TECHNIQUE: Multidetector CT imaging of the chest was performed during intravenous contrast administration. CONTRAST:  36m OMNIPAQUE IOHEXOL 300 MG/ML  SOLN COMPARISON:  Chest x-Lee from earlier in the same day, CT of the abdomen and pelvis from 08/08/2007 and CT of the chest from 02/26/2011. FINDINGS: Cardiovascular: Atherosclerotic changes of the thoracic aorta are noted. No aneurysmal dilatation is seen. Mild cardiomegaly is noted. Postsurgical changes consistent with coronary bypass grafting are noted. Native coronary calcifications are seen. Mediastinum/Nodes: Thoracic inlet is within normal limits. No sizable hilar or mediastinal adenopathy is noted. The esophagus is within normal limits. Lungs/Pleura: Lungs are well aerated bilaterally. Some scattered peripheral ground-glass opacities are identified within both lungs. No sizable effusion or pneumothorax is seen. No sizable nodules are noted. Upper Abdomen: Visualized upper abdomen is within normal limits. Stable dilatation of the collecting system is noted when compared with  2008. Musculoskeletal: Degenerative changes of the thoracic spine are noted. IMPRESSION: Patchy ground-glass infiltrates identified bilaterally without associated effusion. This could represent an atypical pneumonia although the possibility of COVID-19 deserves consideration. Correlation with testing is recommended. No other acute abnormality is noted. Aortic Atherosclerosis (ICD10-I70.0). Electronically Signed   By: MInez CatalinaM.D.   On: 09/15/2019 16:38    Procedures .Critical Care Performed by: GKinnie Feil PA-C Authorized by: GKinnie Feil PA-C   Critical care provider statement:    Critical care time (minutes):  45   Critical care was necessary to treat or prevent imminent or life-threatening deterioration of the following conditions:  Respiratory failure   Critical care was time spent personally by me on the following activities:  Discussions with consultants, evaluation of patient's response to treatment, examination of patient, ordering and performing treatments and interventions, ordering and review of laboratory studies, ordering and review of radiographic studies, pulse oximetry, re-evaluation of patient's condition, obtaining history from patient or surrogate, review of old charts and development of treatment plan with patient or surrogate   I assumed direction of critical care for this patient from another provider in my specialty: no     (including critical care time)  Medications Ordered in ED Medications  iohexol (OMNIPAQUE) 300 MG/ML solution 75 mL (75 mLs Intravenous Contrast Given 09/15/19 1608)     Initial Impression / Assessment and Plan / ED Course  I have reviewed the triage vital signs and the nursing notes.  Pertinent labs & imaging results that were available during my care of the patient  were reviewed by me and considered in my medical decision making (see chart for details).  Clinical Course as of Sep 14 1810  Tue Sep 14, 4154  1761 85 year old  male with history of A. fib here for an elevated INR in the setting of malaise fatigue.  He denies any obvious bleeding.  He said he is actually been taking a lower dose than usual because he knew the last week his INR was 4.5.  He is tired appearing but otherwise normal vital signs other than a soft blood pressure.  Checking labs including a hemoglobin and repeating his INR.   [MB]  1516 Hemoglobin(!): 12.5 [CG]  1516 INR(!!): 6.1 [CG]  1803 1. Mild linear atelectasis in the RIGHT MIDDLE LOBE. No acute cardiopulmonary disease otherwise. 2. Stable cardiomegaly without pulmonary edema.  DG Chest 2 View [CG]    Clinical Course User Index [CG] Kinnie Feil, PA-C [MB] Hayden Rasmussen, MD   Pt's EMR reviewed.   ddx includes symptomatic anemia from occult bleeding from supratherapeutic INR.  PE unlikely given supratherapeutic INR.  Given cough, ACS is less likely.  Symptoms could be from mild decompensated heart failure as well but clinically he does not look fluid overloaded.  Last echo 11/2018 with normal LVEF.  Marland Kitchen  No COVID exposure, fever.    ER work up reviewed by me.  INR 6.1, hgb 12.5. Heme negative here with brown stool.  EKG with rate controlled atrial fibrillation. CXR with atelectasis and chronic cardiomegaly.  Pt has outpatient CT chest ordered by PCP, given symptoms this was done here today.  CT shows findings suspicion for atypical PNA/infection.  COVID test pending. Pt ambulated and desaturated to SpO2 88-89% became very tachycardic in the 140s which resolved with rest. Trop 13.   Pt re-evaluated, no clinical decline. Will place on oxygen [place].    Will speak to hospitalist for admission for hypoxemia from suspected viral illness.  He has no SIRS criteria, leukocytosis to warrant antibiotics and suspicion higher for viral illness. Will hold off antibiotics and IVF for now.  I don't think there is indication for emergent vitamin K here, no active bleeding or hypotension.  Will need  serial troponins and INRs.  Shared with EDP.  Final Clinical Impressions(s) / ED Diagnoses   Final diagnoses:  Hypoxemia  Abnormal CT scan of lung    ED Discharge Orders    None       Arlean Hopping 09/15/19 1811    Maudie Flakes, MD 09/17/19 [phone]

## 2019-09-15 NOTE — ED Notes (Signed)
Pt assisted to side of bed to use urinal and assisted back in bed; pt repositioned

## 2019-09-15 NOTE — ED Notes (Signed)
Called and spoke with son to inform him of his father's condition; son requesting to be called when he is transferred to [facility]

## 2019-09-15 NOTE — Patient Instructions (Signed)
I spoke to the triage nurse at [facility] emergency department.  She is aware that you are on your way  You have had about a point drop in your hemoglobin, your blood pressure has dropped about 20 points since her last visit 1 week ago.  I am worried given that your INR is very high that you are bleeding somewhere.  I would like them to check out your need to make sure that you are not having any complications after your surgery in the knee and hopefully they can obtain the CAT scan of your chest that was ordered

## 2019-09-15 NOTE — ED Triage Notes (Signed)
Pt's INR is >8. Sent by PCP. Had a 1 point drop in hemoglobin in the last month. Systolic BP dropped 20 points in the last week. Pt had knee surgery recently.

## 2019-09-15 NOTE — Progress Notes (Signed)
Subjective: CC: afib anticoagulated on warfarin PCP: [name], DO HPI:Dalton Vaughan is a 85 y.o. male presenting to clinic today for:  1.  Atrial fibrillation/ Chronic anticoagulation Patient reports compliance with warfarin 5 mg daily (except for Fridays when he takes 2.5 mg).  Last INR was supratherapeutic at 4.4.  He was instructed to skip Wednesday's dose and resume normal dosing schedule with follow-up in 1 week.  He notes that he in fact skipped Wednesdays dose and then proceeded with only half tablet daily rather than his normal dosing.  He denies heart palpitations, chest pain, dizziness, melena, hematochezia, hematuria or abnormal bleeding.  He has ongoing coughing spells with some shortness of breath with exertion.  He is having quite a bit of difficulty walking, citing that he is unable to bend his right knee.  Of note, he had a right knee surgery a few weeks ago at [facility] with Dr. Case.  He had follow-up with him on the 17th and has not scheduled to see him again until the end of October.  ROS: Per HPI  Allergies  Allergen Reactions  . Paxil [Paroxetine Hcl] Palpitations  . Eliquis [Apixaban] Other (See Comments)    dizziness  . Penicillins     Said he is  Allergic" Natural combinations-extended release  . Pravachol [Pravastatin Sodium]   . Zetia [Ezetimibe]     myaalgias  . Vytorin [Ezetimibe-Simvastatin] Other (See Comments)   Past Medical History:  Diagnosis Date  . Anal fissure   . Atrial fibrillation ([place])   . BPH (benign prostatic hypertrophy)   . CAD (coronary artery disease)    Dr. Wynonia Lawman  . Cataract   . Depression 2004  . Diverticulosis   . GERD (gastroesophageal reflux disease)   . History of TIAs   . Hyperlipidemia   . Hypertension   . Hypertensive heart disease without CHF   . Hypothyroidism   . Internal hemorrhoids   . Lumbar disc disease   . Oral bleeding 08/20/2012   Following molar tooth extraction July, 2013  . Ruptured  disk 1995  . Shingles   . Thyroid disease   . Vitamin D deficiency   . Vitreous detachment     Current Outpatient Medications:  .  amLODipine (NORVASC) 5 MG tablet, Take 1 tablet (5 mg total) by mouth daily., Disp: 90 tablet, Rfl: 3 .  Calcium Carb-Cholecalciferol (CALCIUM + D3) 600-200 MG-UNIT TABS, Take 0.25 tablets by mouth daily., Disp: , Rfl:  .  Cholecalciferol (VITAMIN D-3) 5000 UNITS TABS, Take 1 tablet by mouth daily. , Disp: , Rfl:  .  doxazosin (CARDURA) 8 MG tablet, Take 1 tablet (8 mg total) by mouth at bedtime., Disp: 90 tablet, Rfl: 3 .  Elastic Bandages & Supports (MEDICAL COMPRESSION SOCKS) MISC, 1 each by Does not apply route daily., Disp: 1 each, Rfl: 2 .  furosemide (LASIX) 40 MG tablet, Take 1 tablet (40 mg total) by mouth daily., Disp: 30 tablet, Rfl: 3 .  gabapentin (NEURONTIN) 100 MG capsule, Take 1 capsule (100 mg total) by mouth at bedtime., Disp: 30 capsule, Rfl: 1 .  HYDROcodone-acetaminophen (NORCO/VICODIN) 5-325 MG tablet, Take 1 tablet by mouth every 4 (four) hours as needed., Disp: 15 tablet, Rfl: 0 .  HYDROcodone-acetaminophen (NORCO/VICODIN) 5-325 MG tablet, Take 1 tablet by mouth every 4 (four) hours as needed., Disp: 6 tablet, Rfl: 0 .  levothyroxine (SYNTHROID) 50 MCG tablet, TAKE 1 TABLET DAILY, Disp: 90 tablet, Rfl: 1 .  mirtazapine (REMERON) 15  MG tablet, Take 1 tablet (15 mg total) by mouth at bedtime., Disp: 30 tablet, Rfl: 1 .  Multiple Vitamin (MULTIVITAMIN) tablet, Take 1 tablet by mouth daily.  , Disp: , Rfl:  .  olopatadine (PATANOL) 0.1 % ophthalmic solution, , Disp: , Rfl:  .  oxyCODONE (OXY IR/ROXICODONE) 5 MG immediate release tablet, TAKE 1 TABLET BY MOUTH EVERY 4 HOURS AS NEEDED FOR moderate pain (score 4-6), Disp: , Rfl:  .  ramipril (ALTACE) 10 MG capsule, Take 1 capsule (10 mg total) by mouth daily., Disp: 90 capsule, Rfl: 1 .  rosuvastatin (CRESTOR) 5 MG tablet, Take 1 tablet (5 mg total) by mouth daily. as directed, Disp: 90 tablet,  Rfl: 3 .  triamterene-hydrochlorothiazide (MAXZIDE-25) 37.5-25 MG tablet, Take 1 tablet by mouth daily., Disp: , Rfl:  .  vitamin C (ASCORBIC ACID) 500 MG tablet, Take 500 mg by mouth daily. Take 1/4 tablet by mouth daily., Disp: , Rfl:  .  warfarin (COUMADIN) 5 MG tablet, TAKE AS DIRECTED, Disp: 90 tablet, Rfl: 3 .  XIIDRA 5 % SOLN, , Disp: , Rfl:  Social History   Socioeconomic History  . Marital status: Married    Spouse name: [name]   . Number of children: 1  . Years of education: 34  . Highest education level: Not on file  Occupational History  . Occupation: Retired    Comment: Social research officer, government x 27 years  . 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 Financial resource strain: Not hard at all  . Food insecurity    Worry: Never true    Inability: Never true  . Transportation needs    Medical: No    Non-medical: No  Tobacco Use  . Smoking status: Former Smoker    Packs/day: 1.00    Years: 18.00    Pack years: 18.00    Types: Cigarettes    Start date: 12/17/1948    Quit date: 12/17/1966    Years since quitting: 52.7  . Smokeless tobacco: Never Used  Substance and Sexual Activity  . Alcohol use: Yes    Alcohol/week: 1.0 standard drinks    Types: 1 Standard drinks or equivalent per week    Comment: Occasionally  . Drug use: No  . Sexual activity: Not on file  Lifestyle  . Physical activity    Days per week: 5 days    Minutes per session: 50 min  . Stress: To some extent  Relationships  . Social connections    Talks on phone: More than three times a week    Gets together: More than three times a week    Attends religious service: Never    Active member of club or organization: No    Attends meetings of clubs or organizations: Never    Relationship status: Married  . Intimate partner violence    Fear of current or ex partner: No    Emotionally abused: No    Physically abused: No    Forced sexual activity: No   Other Topics Concern  . Not on file  Social History Narrative   Married.  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 Dementia Mother   . Arthritis Mother   . Cancer Brother  prostate  . Asthma Sister   . Heart disease Paternal Aunt   . Heart disease Paternal Uncle   . Hypertension Maternal Grandmother   . CVA Maternal Grandmother   . CVA Paternal Grandmother   . Hepatitis C Son   . Arthritis Son   . Colon cancer Neg Hx   . Colon polyps Neg Hx   . Kidney disease Neg Hx   . Esophageal cancer Neg Hx   . Gallbladder disease Neg Hx   . Diabetes Neg Hx     Objective: Office vital signs reviewed. BP 100/66   Pulse 90   Temp (!) 97 F (36.1 C) (Temporal)   Ht 6' (1.829 m)   Wt 164 lb (74.4 kg)   SpO2 93%   BMI 22.24 kg/m   Physical Examination:  General: Awake, alert, well nourished, No acute distress HEENT: Normal; Cardio: Irregularly irregular with rate control.  S1S2 heard, no murmurs appreciated Pulm:  ? rales in left lung field; normal work of breathing on room air when resting but becomes short of breath with activity. Extremities: warm, well perfused.  Right knee with moderate soft tissue swelling, mild erythema and increased warmth.  No appreciable joint effusion noted.  His right knee is certainly larger than his left.  He is unable to bend the right knee without significant discomfort.  He needs quite a bit of assistance with ambulation.  Results for orders placed or performed in visit on 09/15/19 (from the past 24 hour(s))  CoaguChek XS/INR Waived     Status: Abnormal   Collection Time: 09/15/19 11:23 AM  Result Value Ref Range   INR 7.7 (HH) 0.9 - 1.1   Prothrombin Time 91.9 sec   Narrative   Performed at:  [address] [place], [place]  [id number] Lab Director: Colletta [facility], Phone:  [phone]     Assessment/ Plan: 85 y.o. male   1. Supratherapeutic INR His INR was initially 7.7.  Recheck was 8.  His hemoglobin was 12.0 today.  This is one-point drop from his August 16, 2019 hemoglobin.  Additionally, his systolic blood pressure is about 20 points lower than his check 1 week ago.  I am very concerned that he is having a bleed somewhere given his overall weakness.  I have recommended emergent evaluation emergency department for administration of vitamin K, evaluation for bleed.  My concerns were discussed with the patient's grandson, who will drive him.  I have spoken to the emergency department triage nurse at [facility] and she is aware that he is on his way by private transport. - Hemoglobin, fingerstick - Protime-INR  2. Chronic atrial fibrillation (HCC) - CoaguChek XS/INR Waived  3. Chronic anticoagulation - CoaguChek XS/INR Waived   Orders Placed This Encounter  Procedures  . CoaguChek XS/INR Waived  . Hemoglobin, fingerstick  . Protime-INR   No orders of the defined types were placed in this encounter.    [name], DO [place] [phone]

## 2019-09-15 NOTE — ED Notes (Signed)
Date and time results received: 09/15/19 2045 (use smartphrase ".now" to insert current time)  Test: Covid Critical Value: positive  Name of Provider Notified: Dr. Denton Brick  Orders Received? Or Actions Taken?: no/na

## 2019-09-15 NOTE — ED Notes (Signed)
Report given to Shanon Brow with Karen Chafe

## 2019-09-15 NOTE — ED Notes (Signed)
CRITICAL VALUE ALERT  Critical Value:  inr 6.1  Date & Time Notied: 3794 9/29  Provider Notified: bUTLER  Orders Received/Actions taken:

## 2019-09-16 DIAGNOSIS — J9601 Acute respiratory failure with hypoxia: Secondary | ICD-10-CM | POA: Diagnosis present

## 2019-09-16 LAB — CBC WITH DIFFERENTIAL/PLATELET
Abs Immature Granulocytes: 0.1 10*3/uL — ABNORMAL HIGH (ref 0.00–0.07)
Basophils Absolute: 0 10*3/uL (ref 0.0–0.1)
Basophils Relative: 0 %
Eosinophils Absolute: 0 10*3/uL (ref 0.0–0.5)
Eosinophils Relative: 1 %
HCT: 33.9 % — ABNORMAL LOW (ref 39.0–52.0)
Hemoglobin: 10.8 g/dL — ABNORMAL LOW (ref 13.0–17.0)
Immature Granulocytes: 1 %
Lymphocytes Relative: 13 %
Lymphs Abs: 1 10*3/uL (ref 0.7–4.0)
MCH: 28.9 pg (ref 26.0–34.0)
MCHC: 31.9 g/dL (ref 30.0–36.0)
MCV: 90.6 fL (ref 80.0–100.0)
Monocytes Absolute: 0.4 10*3/uL (ref 0.1–1.0)
Monocytes Relative: 5 %
Neutro Abs: 5.8 10*3/uL (ref 1.7–7.7)
Neutrophils Relative %: 80 %
Platelets: 215 10*3/uL (ref 150–400)
RBC: 3.74 MIL/uL — ABNORMAL LOW (ref 4.22–5.81)
RDW: 14.4 % (ref 11.5–15.5)
WBC: 7.3 10*3/uL (ref 4.0–10.5)
nRBC: 0 % (ref 0.0–0.2)

## 2019-09-16 LAB — FERRITIN: Ferritin: 726 ng/mL — ABNORMAL HIGH (ref 24–336)

## 2019-09-16 LAB — PROTIME-INR
INR: 6.9 (ref 0.8–1.2)
INR: 5.7 (ref 0.8–1.2)
Prothrombin Time: 67.6 s — ABNORMAL HIGH (ref 9.1–12.0)
Prothrombin Time: 50.2 seconds — ABNORMAL HIGH (ref 11.4–15.2)

## 2019-09-16 LAB — COMPREHENSIVE METABOLIC PANEL
ALT: 14 U/L (ref 0–44)
AST: 23 U/L (ref 15–41)
Albumin: 2.8 g/dL — ABNORMAL LOW (ref 3.5–5.0)
Alkaline Phosphatase: 113 U/L (ref 38–126)
Anion gap: 9 (ref 5–15)
BUN: 15 mg/dL (ref 8–23)
CO2: 23 mmol/L (ref 22–32)
Calcium: 8.7 mg/dL — ABNORMAL LOW (ref 8.9–10.3)
Chloride: 106 mmol/L (ref 98–111)
Creatinine, Ser: 0.71 mg/dL (ref 0.61–1.24)
GFR calc Af Amer: >60 mL/min (ref >60)
GFR calc non Af Amer: >60 mL/min (ref >60)
Glucose, Bld: 116 mg/dL — ABNORMAL HIGH (ref 70–99)
Potassium: 3.6 mmol/L (ref 3.5–5.1)
Sodium: 138 mmol/L (ref 135–145)
Total Bilirubin: 0.7 mg/dL (ref 0.3–1.2)
Total Protein: 6.1 g/dL — ABNORMAL LOW (ref 6.5–8.1)

## 2019-09-16 LAB — D-DIMER, QUANTITATIVE: D-Dimer, Quant: 1.09 ug/mL-FEU — ABNORMAL HIGH (ref 0.00–0.50)

## 2019-09-16 LAB — SAMPLE TO BLOOD BANK

## 2019-09-16 LAB — TYPE AND SCREEN
ABO/RH(D): O NEG
Antibody Screen: NEGATIVE

## 2019-09-16 LAB — C-REACTIVE PROTEIN: CRP: 7.9 mg/dL — ABNORMAL HIGH (ref <1.0)

## 2019-09-16 LAB — ABO/RH: ABO/RH(D): O NEG

## 2019-09-16 MED ORDER — OLOPATADINE HCL 0.1 % OP SOLN
1.0000 [drp] | Freq: Two times a day (BID) | OPHTHALMIC | Status: DC
  Administered 2019-09-16 – 2019-09-20 (×8): 1 [drp] via OPHTHALMIC
  Filled 2019-09-16: qty 5

## 2019-09-16 MED ORDER — ONDANSETRON HCL 4 MG PO TABS: 4.0000 mg | ORAL_TABLET | Freq: Four times a day (QID) | ORAL | Status: DC | PRN

## 2019-09-16 MED ORDER — GUAIFENESIN-DM 100-10 MG/5ML PO SYRP: 10.0000 mL | ORAL_SOLUTION | ORAL | Status: DC | PRN

## 2019-09-16 MED ORDER — ONDANSETRON HCL 4 MG/2ML IJ SOLN: 4.0000 mg | Freq: Four times a day (QID) | INTRAMUSCULAR | Status: DC | PRN

## 2019-09-16 MED ORDER — POLYETHYLENE GLYCOL 3350 17 G PO PACK
17.0000 g | PACK | Freq: Every day | ORAL | Status: DC | PRN
  Administered 2019-09-17: 17 g via ORAL
  Filled 2019-09-16: qty 1

## 2019-09-16 MED ORDER — LEVOTHYROXINE SODIUM 50 MCG PO TABS
50.0000 ug | ORAL_TABLET | Freq: Every day | ORAL | Status: DC
  Administered 2019-09-16 – 2019-09-20 (×5): 50 ug via ORAL
  Filled 2019-09-16 (×5): qty 1

## 2019-09-16 MED ORDER — DOXAZOSIN MESYLATE 8 MG PO TABS
8.0000 mg | ORAL_TABLET | Freq: Every day | ORAL | Status: DC
  Administered 2019-09-16 – 2019-09-19 (×4): 8 mg via ORAL
  Filled 2019-09-16 (×5): qty 1

## 2019-09-16 MED ORDER — ACETAMINOPHEN 325 MG PO TABS
650.0000 mg | ORAL_TABLET | Freq: Four times a day (QID) | ORAL | Status: DC | PRN
  Filled 2019-09-16: qty 2

## 2019-09-16 MED ORDER — ROSUVASTATIN CALCIUM 5 MG PO TABS
5.0000 mg | ORAL_TABLET | ORAL | Status: DC
  Administered 2019-09-16 – 2019-09-18 (×2): 5 mg via ORAL
  Filled 2019-09-16 (×3): qty 1

## 2019-09-16 MED ORDER — DEXAMETHASONE 6 MG PO TABS
6.0000 mg | ORAL_TABLET | Freq: Every day | ORAL | Status: DC
  Administered 2019-09-16 – 2019-09-18 (×3): 6 mg via ORAL
  Filled 2019-09-16 (×4): qty 1

## 2019-09-16 MED ORDER — GABAPENTIN 100 MG PO CAPS
100.0000 mg | ORAL_CAPSULE | Freq: Every day | ORAL | Status: DC
  Administered 2019-09-16 – 2019-09-19 (×4): 100 mg via ORAL
  Filled 2019-09-16 (×4): qty 1

## 2019-09-16 MED ORDER — SODIUM CHLORIDE 0.9 % IV SOLN
200.0000 mg | Freq: Once | INTRAVENOUS | Status: AC
  Administered 2019-09-16: 200 mg via INTRAVENOUS
  Filled 2019-09-16: qty 40

## 2019-09-16 MED ORDER — SODIUM CHLORIDE 0.9 % IV SOLN
100.0000 mg | INTRAVENOUS | Status: AC
  Administered 2019-09-17 – 2019-09-19 (×3): 100 mg via INTRAVENOUS
  Filled 2019-09-16 (×3): qty 20

## 2019-09-16 MED ORDER — LIFITEGRAST 5 % OP SOLN: 1.0000 [drp] | Freq: Every day | OPHTHALMIC | Status: DC

## 2019-09-16 NOTE — Progress Notes (Signed)
Patient shared with this nurse his wife of the past 60 plus years passed away five days ago

## 2019-09-16 NOTE — Progress Notes (Signed)
PT Cancellation Note  Patient Details Name: Dalton Vaughan MRN: [id number] DOB: 09-Sep-1929   Cancelled Treatment:    Reason Eval/Treat Not Completed: Patient not medically ready  Patient with recent rt knee surgery 08/28/19 for suprapatellar hematoma evacuation. His INR is currently 5.7. Have reached out to Dr Bonner Puna to discuss risk of hemarthrosis.    Barry Brunner, PT     Rexanne Mano 09/16/2019, 12:09 PM

## 2019-09-16 NOTE — Progress Notes (Signed)
PROGRESS NOTE  Dalton Vaughan  HDQ:[id number] DOB: [birth year] DOA: 09/15/2019 PCP: [name], DO  Outpatient Specialists: Orthopedics, Dr. Wynelle Link  Brief Narrative: Dalton Vaughan is an 85 y.o. male with a history of SSS, atrial fibrillation on coumadin, CAD, and hypothyroidism who presented from PCP's office to [facility] ED with weakness in the setting of poor per oral intake. He was found to have 1g drop in hgb, worsening supratherapeutic INR, and lower blood pressure at PCP's office. In the ED, he was hypoxic with ambulation with infiltrates on CT chest and positive covid-19 testing for which he was admitted to [facility] and started on remdesivir and decadron. He had been caring for his wife intensively for 6-7 months during a decline in her health which culminated in hospitalization and subsequent hospice placement due to end-stage multiple myeloma. While caretaking he experienced a meniscus tear complicated by hemarthrosis for which arthroscopy was performed earlier this month. Since that time, while he has been able to use the knee more, he has had progressive lack of appetite, depressed mood, change in taste. He was also found to have a supratherapeutic INR for which vitamin K was given.   Assessment & Plan: Active Problems:   Pneumonia due to COVID-19 virus  Acute hypoxic respiratory failure due to covid-19 pneumonia:  - Continue remdesivir 9/30 - 10/4. - Continue steroids 9/30 - 10/9 - Continue airborne, contact precautions. PPE including surgical gown, gloves, cap, shoe covers, and CAPR used during this encounter in a negative pressure room.  - Check daily labs: CBC w/diff, CMP, d-dimer, ferritin, CRP - Avoid NSAIDs - Recommend proning and aggressive use of incentive spirometry. - Goals of care were discussed. Prognosis is guarded.   Chronic atrial fibrillation:  - Holding anticoagulation, but would benefit long term from reinitiation. CHA2DS2-VASc score is 7.  - Rate is controlled without AV  nodal agents. Minimal PVC burden, no significant arrhythmias on my personal review of telemetry today. Can transfer to med-surg status.  CAD s/p CABG 1999 by Dr. Servando Snare.  - No angina, hs-Tn wnl. Not on beta blocker due to SSS presumably. - Continue MWF statin.  Supratherapeutic INR: Likely due to minimal per oral intake.  - s/p vitamin K 67m po x1 9/29.  - Continue trending INR. Due to recent hematoma and reported history of bleeding, will aim for lower end of goal 2-3 INR.   Moderate protein-calorie malnutrition:  - Dietitian consulted.  - Continue remeron  Suprapatellar pouch hematoma: s/p debridement and right knee arthroscopy, MUA on 08/28/2019 by Dr. Case.  - PT/OT, OOB   Hypothyroidism: TSH 2.850 - Continue synthroid.   Glaucoma:  - Continue home gtt's  Grief reaction: Appropriately processing the recent death of his wife.  - Continue remeron - Empathetic conversation provided. Consider outpatient counseling.  DVT prophylaxis: SCDs, supratherapeutic INR. Code Status: Full Family Communication: None at bedside Disposition Plan: Uncertain. PT/OT consulted.   Consultants:   None  Procedures:   None  Antimicrobials:  Remdesivir 9/30 - 10/4   Subjective: Feels slightly less weak, no difficulty breathing at rest, but moderate with exertion, has not gotten up. No chest pain. No bleeding reported.   Objective: Vitals:   09/16/19 0426 09/16/19 0500 09/16/19 0600 09/16/19 0826  BP:   120/62 132/89  Pulse:  70  87  Resp:  19  17  Temp: 97.6 F (36.4 C)   98.5 F (36.9 C)  TempSrc: Oral   Oral  SpO2:  96%  98%  Weight:  Height:        Intake/Output Summary (Last 24 hours) at 09/16/2019 1025 Last data filed at 09/16/2019 0600 Gross per 24 hour  Intake 132.64 ml  Output 375 ml  Net -242.36 ml   Filed Weights   09/15/19 1336 09/15/19 2300  Weight: 74.4 kg 70 kg    Gen: Elderly, interactive male in no distress Pulm: Non-labored breathing 2L O2.  Clear to auscultation bilaterally.  CV: Irreg irreg. No murmur, rub, or gallop. No JVD, no pedal edema. GI: Abdomen soft, non-tender, non-distended, with normoactive bowel sounds. No organomegaly or masses felt. Ext: Warm, no deformities, arthroscopy incision sites well healed, minimal right knee palpable effusion without ecchymosis. Limited ROM by pain. Distally NVI.  Skin: No rashes, lesions or ulcers Neuro: Alert and oriented. No focal neurological deficits. Psych: Judgement and insight appear normal. Mood & affect appropriate.   Data Reviewed: I have personally reviewed following labs and imaging studies  CBC: Recent Labs  Lab 09/15/19 1417 09/16/19 0130  WBC 8.0 7.3  NEUTROABS  --  5.8  HGB 12.5* 10.8*  HCT 39.2 33.9*  MCV 92.0 90.6  PLT 225 283   Basic Metabolic Panel: Recent Labs  Lab 09/15/19 1417 09/16/19 0130  NA 135 138  K 3.6 3.6  CL 101 106  CO2 25 23  GLUCOSE 121* 116*  BUN 18 15  CREATININE 0.90 0.71  CALCIUM 9.2 8.7*   GFR: Estimated Creatinine Clearance: 63.2 mL/min (by C-G formula based on SCr of 0.71 mg/dL). Liver Function Tests: Recent Labs  Lab 09/16/19 0130  AST 23  ALT 14  ALKPHOS 113  BILITOT 0.7  PROT 6.1*  ALBUMIN 2.8*   No results for input(s): LIPASE, AMYLASE in the last 168 hours. No results for input(s): AMMONIA in the last 168 hours. Coagulation Profile: Recent Labs  Lab 09/09/19 1521 09/15/19 1123 09/15/19 1220 09/15/19 1417 09/16/19 0130  INR 4.5* 7.7* 6.9* 6.1* 5.7*   Cardiac Enzymes: No results for input(s): CKTOTAL, CKMB, CKMBINDEX, TROPONINI in the last 168 hours. BNP (last 3 results) Recent Labs    11/07/18 1615  PROBNP 2,681*   HbA1C: No results for input(s): HGBA1C in the last 72 hours. CBG: No results for input(s): GLUCAP in the last 168 hours. Lipid Profile: No results for input(s): CHOL, HDL, LDLCALC, TRIG, CHOLHDL, LDLDIRECT in the last 72 hours. Thyroid Function Tests: No results for input(s):  TSH, T4TOTAL, FREET4, T3FREE, THYROIDAB in the last 72 hours. Anemia Panel: Recent Labs    09/15/19 1417 09/16/19 0130  FERRITIN 855* 726*   Urine analysis:    Component Value Date/Time   COLORURINE YELLOW 07/31/2012 1452   APPEARANCEUR Clear 10/30/2018 1556   LABSPEC 1.009 07/31/2012 1452   PHURINE 7.0 07/31/2012 1452   GLUCOSEU Negative 10/30/2018 1556   HGBUR NEGATIVE 07/31/2012 1452   BILIRUBINUR Negative 10/30/2018 1556   KETONESUR 15 (A) 07/31/2012 1452   PROTEINUR Negative 10/30/2018 1556   PROTEINUR NEGATIVE 07/31/2012 1452   UROBILINOGEN negative 11/03/2013 1602   UROBILINOGEN 0.2 07/31/2012 1452   NITRITE Negative 10/30/2018 1556   NITRITE NEGATIVE 07/31/2012 1452   LEUKOCYTESUR Negative 10/30/2018 1556   Recent Results (from the past 240 hour(s))  SARS Coronavirus 2 [facility] order, Performed in [facility] hospital lab) Nasopharyngeal Nasopharyngeal Swab     Status: Abnormal   Collection Time: 09/15/19  4:59 PM   Specimen: Nasopharyngeal Swab  Result Value Ref Range Status   SARS Coronavirus 2 POSITIVE (A) NEGATIVE Final  Comment: RESULT CALLED TO, READ BACK BY AND VERIFIED WITH: TALBET,T AT 2040 ON 9.29.20 BY ISLEY,B (NOTE) If result is NEGATIVE SARS-CoV-2 target nucleic acids are NOT DETECTED. The SARS-CoV-2 RNA is generally detectable in upper and lower  respiratory specimens during the acute phase of infection. The lowest  concentration of SARS-CoV-2 viral copies this assay can detect is 250  copies / mL. A negative result does not preclude SARS-CoV-2 infection  and should not be used as the sole basis for treatment or other  patient management decisions.  A negative result may occur with  improper specimen collection / handling, submission of specimen other  than nasopharyngeal swab, presence of viral mutation(s) within the  areas targeted by this assay, and inadequate number of viral copies  (<250 copies / mL). A negative result must be combined with  clinical  observations, patient history, and epidemiological information. If result is POSITIVE SARS-CoV-2 target nucleic acids are DETECTED.  The SARS-CoV-2 RNA is generally detectable in upper and lower  respiratory specimens during the acute phase of infection.  Positive  results are indicative of active infection with SARS-CoV-2.  Clinical  correlation with patient history and other diagnostic information is  necessary to determine patient infection status.  Positive results do  not rule out bacterial infection or co-infection with other viruses. If result is PRESUMPTIVE POSTIVE SARS-CoV-2 nucleic acids MAY BE PRESENT.   A presumptive positive result was obtained on the submitted specimen  and confirmed on repeat testing.  While 2019 novel coronavirus  (SARS-CoV-2) nucleic acids may be present in the submitted sample  additional confirmatory testing may be necessary for epidemiological  and / or clinical management purposes  to differentiate between  SARS-CoV-2 and other Sarbecovirus currently known to infect humans.  If clinically indicated additional testing with an alternate test  methodology [phone])  is advised. The SARS-CoV-2 RNA is generally  detectable in upper and lower respiratory specimens during the acute  phase of infection. The expected result is Negative. Fact Sheet for Patients:  StrictlyIdeas.no Fact Sheet for Healthcare Providers: BankingDealers.co.za This test is not yet approved or cleared by the [place] FDA and has been authorized for detection and/or diagnosis of SARS-CoV-2 by FDA under an Emergency Use Authorization (EUA).  This EUA will remain in effect (meaning this test can be used) for the duration of the COVID-19 declaration under Section 564(b)(1) of the Act, 21 U.S.C. section 360bbb-3(b)(1), unless the authorization is terminated or revoked sooner. Performed at [facility], [address]  [place], [place] [address]       Radiology Studies: Dg Chest 2 View  Result Date: 09/15/2019 CLINICAL DATA:  85 year old presenting with acute onset of cough. EXAM: CHEST - 2 VIEW COMPARISON:  12/01/2018 and earlier. FINDINGS: AP ERECT and LATERAL images were obtained. Prior sternotomy for CABG. Cardiac silhouette mildly to moderately enlarged, unchanged. Thoracic aorta tortuous and atherosclerotic, unchanged. Hilar and mediastinal contours otherwise unremarkable. Mild linear atelectasis in the RIGHT MIDDLE LOBE. Lungs otherwise clear. Pulmonary vascularity normal. Bronchovascular markings normal. No pleural effusions. Degenerative changes involving the thoracic and visualized lumbar spine. IMPRESSION: 1. Mild linear atelectasis in the RIGHT MIDDLE LOBE. No acute cardiopulmonary disease otherwise. 2. Stable cardiomegaly without pulmonary edema. Electronically Signed   By: Evangeline Dakin M.D.   On: 09/15/2019 14:52   Ct Chest W Contrast  Result Date: 09/15/2019 CLINICAL DATA:  Cough for several weeks with shortness of Breath EXAM: CT CHEST WITH CONTRAST TECHNIQUE: Multidetector CT imaging of the chest  was performed during intravenous contrast administration. CONTRAST:  53m OMNIPAQUE IOHEXOL 300 MG/ML  SOLN COMPARISON:  Chest x-Jahad from earlier in the same day, CT of the abdomen and pelvis from 08/08/2007 and CT of the chest from 02/26/2011. FINDINGS: Cardiovascular: Atherosclerotic changes of the thoracic aorta are noted. No aneurysmal dilatation is seen. Mild cardiomegaly is noted. Postsurgical changes consistent with coronary bypass grafting are noted. Native coronary calcifications are seen. Mediastinum/Nodes: Thoracic inlet is within normal limits. No sizable hilar or mediastinal adenopathy is noted. The esophagus is within normal limits. Lungs/Pleura: Lungs are well aerated bilaterally. Some scattered peripheral ground-glass opacities are identified within both lungs. No sizable effusion or pneumothorax  is seen. No sizable nodules are noted. Upper Abdomen: Visualized upper abdomen is within normal limits. Stable dilatation of the collecting system is noted when compared with 2008. Musculoskeletal: Degenerative changes of the thoracic spine are noted. IMPRESSION: Patchy ground-glass infiltrates identified bilaterally without associated effusion. This could represent an atypical pneumonia although the possibility of COVID-19 deserves consideration. Correlation with testing is recommended. No other acute abnormality is noted. Aortic Atherosclerosis (ICD10-I70.0). Electronically Signed   By: MInez CatalinaM.D.   On: 09/15/2019 16:38    Scheduled Meds: . dexamethasone  6 mg Oral Daily  . mirtazapine  15 mg Oral QHS   Continuous Infusions: . [START ON 09/17/2019] remdesivir 100 mg in NS 250 mL       LOS: 1 day   Time spent: 35 minutes.  RPatrecia Pour MD [facility] [email or web address] Password [email or web address] 09/16/2019, 10:25 AM

## 2019-09-16 NOTE — Progress Notes (Signed)
Weaned patient off of oxygen stating 93% on RA.  Attempted to call patients emergency contact but no answer at this time

## 2019-09-16 NOTE — Progress Notes (Signed)
Attempted to call patients emergency contact to inform them of patient arrival at [facility]. However, no answer at this time

## 2019-09-16 NOTE — Plan of Care (Signed)
  Problem: Activity: Goal: Risk for activity intolerance will decrease Outcome: Progressing   

## 2019-09-17 LAB — COMPREHENSIVE METABOLIC PANEL
ALT: 19 U/L (ref 0–44)
AST: 29 U/L (ref 15–41)
Albumin: 2.7 g/dL — ABNORMAL LOW (ref 3.5–5.0)
Alkaline Phosphatase: 119 U/L (ref 38–126)
Anion gap: 9 (ref 5–15)
BUN: 21 mg/dL (ref 8–23)
CO2: 24 mmol/L (ref 22–32)
Calcium: 9.1 mg/dL (ref 8.9–10.3)
Chloride: 103 mmol/L (ref 98–111)
Creatinine, Ser: 0.88 mg/dL (ref 0.61–1.24)
GFR calc Af Amer: >60 mL/min (ref >60)
GFR calc non Af Amer: >60 mL/min (ref >60)
Glucose, Bld: 139 mg/dL — ABNORMAL HIGH (ref 70–99)
Potassium: 4.1 mmol/L (ref 3.5–5.1)
Sodium: 136 mmol/L (ref 135–145)
Total Bilirubin: 0.5 mg/dL (ref 0.3–1.2)
Total Protein: 6.3 g/dL — ABNORMAL LOW (ref 6.5–8.1)

## 2019-09-17 LAB — PROTIME-INR
INR: 2.4 — ABNORMAL HIGH (ref 0.8–1.2)
Prothrombin Time: 26.1 seconds — ABNORMAL HIGH (ref 11.4–15.2)

## 2019-09-17 LAB — CBC WITH DIFFERENTIAL/PLATELET
Abs Immature Granulocytes: 0.17 10*3/uL — ABNORMAL HIGH (ref 0.00–0.07)
Basophils Absolute: 0 10*3/uL (ref 0.0–0.1)
Basophils Relative: 0 %
Eosinophils Absolute: 0 10*3/uL (ref 0.0–0.5)
Eosinophils Relative: 0 %
HCT: 37.1 % — ABNORMAL LOW (ref 39.0–52.0)
Hemoglobin: 11.9 g/dL — ABNORMAL LOW (ref 13.0–17.0)
Immature Granulocytes: 1 %
Lymphocytes Relative: 14 %
Lymphs Abs: 1.9 10*3/uL (ref 0.7–4.0)
MCH: 28.7 pg (ref 26.0–34.0)
MCHC: 32.1 g/dL (ref 30.0–36.0)
MCV: 89.6 fL (ref 80.0–100.0)
Monocytes Absolute: 0.5 10*3/uL (ref 0.1–1.0)
Monocytes Relative: 4 %
Neutro Abs: 10.4 10*3/uL — ABNORMAL HIGH (ref 1.7–7.7)
Neutrophils Relative %: 81 %
Platelets: 297 10*3/uL (ref 150–400)
RBC: 4.14 MIL/uL — ABNORMAL LOW (ref 4.22–5.81)
RDW: 14.4 % (ref 11.5–15.5)
WBC: 13 10*3/uL — ABNORMAL HIGH (ref 4.0–10.5)
nRBC: 0 % (ref 0.0–0.2)

## 2019-09-17 LAB — C-REACTIVE PROTEIN: CRP: 6.1 mg/dL — ABNORMAL HIGH (ref <1.0)

## 2019-09-17 MED ORDER — POLYETHYLENE GLYCOL 3350 17 G PO PACK
17.0000 g | PACK | Freq: Every day | ORAL | Status: DC
  Administered 2019-09-17 – 2019-09-20 (×4): 17 g via ORAL
  Filled 2019-09-17 (×4): qty 1

## 2019-09-17 MED ORDER — SENNOSIDES-DOCUSATE SODIUM 8.6-50 MG PO TABS
1.0000 | ORAL_TABLET | Freq: Every day | ORAL | Status: DC
  Administered 2019-09-17 – 2019-09-20 (×4): 1 via ORAL
  Filled 2019-09-17 (×4): qty 1

## 2019-09-17 MED ORDER — WARFARIN - PHARMACIST DOSING INPATIENT
Freq: Every day | Status: DC
  Administered 2019-09-17 – 2019-09-18 (×2)

## 2019-09-17 MED ORDER — ENSURE ENLIVE PO LIQD
237.0000 mL | Freq: Three times a day (TID) | ORAL | Status: DC
  Administered 2019-09-17 – 2019-09-19 (×4): 237 mL via ORAL

## 2019-09-17 MED ORDER — METOPROLOL TARTRATE 25 MG PO TABS
12.5000 mg | ORAL_TABLET | Freq: Two times a day (BID) | ORAL | Status: DC
  Administered 2019-09-17 – 2019-09-20 (×6): 12.5 mg via ORAL
  Filled 2019-09-17 (×7): qty 1

## 2019-09-17 MED ORDER — BISACODYL 10 MG RE SUPP
10.0000 mg | Freq: Every day | RECTAL | Status: DC | PRN
  Administered 2019-09-19: 10 mg via RECTAL
  Filled 2019-09-17: qty 1

## 2019-09-17 MED ORDER — WARFARIN SODIUM 2 MG PO TABS
2.0000 mg | ORAL_TABLET | Freq: Once | ORAL | Status: AC
  Administered 2019-09-17: 2 mg via ORAL
  Filled 2019-09-17: qty 1

## 2019-09-17 NOTE — Evaluation (Signed)
Physical Therapy Evaluation Patient Details Name: Dalton Vaughan MRN: [id number] DOB: 06/08/1929 Today's Date: 09/17/2019   History of Present Illness  85 y.o. male with medical history significant for atrial fibrillation with sick sinus syndrome, congestive heart failure, hypertension, coronary artery disease, lumbar disc herniation presented to the ED 09/15/19 with complaints of difficulty breathing , weakness when walking since his recent knee surgery for a  spontaneous bleed into his knee following arthrocopic surger for torm meniscus 8/20. Patient lives alone. Sent to ED by PCP due to INR 8.0; O2 sats 88% on room air; COVID +. Wife  passed away 5 days PTA.  Clinical Impression  Today , patient's INR 2.4 so proceeded with evaluation. Patient presents with dyspnea 3/4, HR up to 133 with minimal mobility to tranfer to recliner. Patient performed gentle AROM  To right knee. The patient should progress to Dc home if family is available to assist.  Patient my benefit from visit from chaplain as wife recently passed away. Pt admitted with above diagnosis.  Pt currently with functional limitations due to the deficits listed below (see PT Problem List). Pt will benefit from skilled PT to increase their independence and safety with mobility to allow discharge to the venue listed below.       Follow Up Recommendations Supervision/Assistance - 24 hour    Equipment Recommendations  None recommended by PT    Recommendations for Other Services       Precautions / Restrictions Precautions Precautions: Fall Precaution Comments: watch INR , ? limit right Knee forced flexion, HR high      Mobility  Bed Mobility Overal bed mobility: Needs Assistance Bed Mobility: Sit to Supine       Sit to supine: Supervision   General bed mobility comments: extra time to mobilize to bed edge  Transfers Overall transfer level: Needs assistance Equipment used: Rolling walker (2 wheeled) Transfers: Sit to/from  [facility] Sit to Stand: Min assist;From elevated surface Stand pivot transfers: Min assist       General transfer comment: Steady assist to poser up, required bed to be raised. took several steps to recliner , reached for recliner arms and used 1 side of RW.  Ambulation/Gait             General Gait Details: NT as HR up to 133 with minimal mobility  Stairs            Wheelchair Mobility    Modified Rankin (Stroke Patients Only)       Balance Overall balance assessment: Needs assistance Sitting-balance support: No upper extremity supported;Feet supported Sitting balance-Leahy Scale: Good     Standing balance support: During functional activity;No upper extremity supported Standing balance-Leahy Scale: Fair                               Pertinent Vitals/Pain Pain Assessment: Faces Faces Pain Scale: Hurts little more Pain Location: right lower leg Pain Descriptors / Indicators: Discomfort Pain Intervention(s): Monitored during session    Home Living Family/patient expects to be discharged to:: Private residence Living Arrangements: Alone;Children;Other relatives Available Help at Discharge: Family;Available PRN/intermittently Type of Home: House Home Access: Stairs to enter   Entrance Stairs-Number of Steps: 1 Home Layout: One level Home Equipment: Walker - 2 wheels;Kasandra Knudsen - single point Additional Comments: pt reports that son and grandson will live with patient.    Prior Function Level of Independence: Independent with assistive device(s)  Hand Dominance        Extremity/Trunk Assessment   Upper Extremity Assessment Upper Extremity Assessment: Defer to OT evaluation    Lower Extremity Assessment Lower Extremity Assessment: RLE deficits/detail RLE Deficits / Details: knee AROM:10-85    Cervical / Trunk Assessment Cervical / Trunk Assessment: Kyphotic  Communication   Communication: No  difficulties  Cognition Arousal/Alertness: Awake/alert Behavior During Therapy: WFL for tasks assessed/performed Overall Cognitive Status: Within Functional Limits for tasks assessed                                 General Comments: expresses sadness since wife died      General Comments      Exercises     Assessment/Plan    PT Assessment Patient needs continued PT services  PT Problem List Decreased strength;Decreased mobility;Decreased safety awareness;Decreased range of motion;Decreased knowledge of precautions;Decreased activity tolerance;Cardiopulmonary status limiting activity;Decreased knowledge of use of DME       PT Treatment Interventions DME instruction;Therapeutic activities;Gait training;Therapeutic exercise;Patient/family education;Functional mobility training    PT Goals (Current goals can be found in the Care Plan section)  Acute Rehab PT Goals Patient Stated Goal: to return home PT Goal Formulation: With patient Time For Goal Achievement: 10/01/19 Potential to Achieve Goals: Good    Frequency Min 3X/week   Barriers to discharge        Co-evaluation               AM-PAC PT "6 Clicks" Mobility  Outcome Measure Help needed turning from your back to your side while in a flat bed without using bedrails?: None Help needed moving from lying on your back to sitting on the side of a flat bed without using bedrails?: None Help needed moving to and from a bed to a chair (including a wheelchair)?: A Little Help needed standing up from a chair using your arms (e.g., wheelchair or bedside chair)?: A Little Help needed to walk in hospital room?: A Little Help needed climbing 3-5 steps with a railing? : A Lot 6 Click Score: 19    End of Session Equipment Utilized During Treatment: Oxygen Activity Tolerance: Patient limited by fatigue;Treatment limited secondary to medical complications (Comment)(incr. HR) Patient left: in chair;with call  bell/phone within reach;with chair alarm set Nurse Communication: Mobility status PT Visit Diagnosis: Unsteadiness on feet (R26.81);Difficulty in walking, not elsewhere classified (R26.2);Other abnormalities of gait and mobility (R26.89)    Time: 1027-2536 PT Time Calculation (min) (ACUTE ONLY): 34 min   Charges:   PT Evaluation $PT Eval Moderate Complexity: 1 Mod PT Treatments $Therapeutic Activity: 8-22 mins        Tresa Endo PT Acute Rehabilitation Services Pager [phone] Office [phone]   Claretha Cooper 09/17/2019, 2:21 PM

## 2019-09-17 NOTE — Progress Notes (Signed)
Updated patients emergency contact, Domnic, on patient plan of care and all questions answered at this time

## 2019-09-17 NOTE — Progress Notes (Signed)
ANTICOAGULATION CONSULT NOTE - Initial Consult  Pharmacy Consult for warfarin Indication: atrial fibrillation  Allergies  Allergen Reactions  . Paxil [Paroxetine Hcl] Palpitations  . Eliquis [Apixaban] Other (See Comments)    dizziness  . Penicillins Other (See Comments)    Did it involve swelling of the face/tongue/throat, SOB, or low BP? Unknown Did it involve sudden or severe rash/hives, skin peeling, or any reaction on the inside of your mouth or nose? Unknown Did you need to seek medical attention at a hospital or doctor's office? Unknown When did it last happen?unk If all above answers are "NO", may proceed with cephalosporin use.   Christy Sartorius Sodium] Other (See Comments)    myalgias  . Zetia [Ezetimibe] Other (See Comments)    myalgias  . Vytorin [Ezetimibe-Simvastatin] Other (See Comments)    myalgias    Patient Measurements: Height: 6' (182.9 cm) Weight: 154 lb 5.2 oz (70 kg) IBW/kg (Calculated) : 77.6  Vital Signs: Temp: 97.2 F (36.2 C) (10/01 0808) Temp Source: Oral (10/01 0808) BP: 124/78 (10/01 0808) Pulse Rate: 94 (10/01 0808)  Labs: Recent Labs    09/15/19 1417 09/15/19 1714 09/16/19 0130 09/17/19 0252  HGB 12.5*  --  10.8* 11.9*  HCT 39.2  --  33.9* 37.1*  PLT 225  --  215 297  LABPROT 53.1*  --  50.2* 26.1*  INR 6.1*  --  5.7* 2.4*  CREATININE 0.90  --  0.71 0.88  TROPONINIHS  --  13  --   --     Estimated Creatinine Clearance: 57.4 mL/min (by C-G formula based on SCr of 0.88 mg/dL).   Medical History: Past Medical History:  Diagnosis Date  . Anal fissure   . Atrial fibrillation ([place])   . BPH (benign prostatic hypertrophy)   . CAD (coronary artery disease)    Dr. Wynonia Lawman  . Cataract   . Depression 2004  . Diverticulosis   . GERD (gastroesophageal reflux disease)   . History of TIAs   . Hyperlipidemia   . Hypertension   . Hypertensive heart disease without CHF   . Hypothyroidism   . Internal hemorrhoids   .  Lumbar disc disease   . Oral bleeding 08/20/2012   Following molar tooth extraction July, 2013  . Ruptured disk 1995  . Shingles   . Thyroid disease   . Vitamin D deficiency   . Vitreous detachment     Medications:  Medications Prior to Admission  Medication Sig Dispense Refill Last Dose  . amLODipine (NORVASC) 5 MG tablet Take 1 tablet (5 mg total) by mouth daily. (Patient taking differently: Take 2.5 mg by mouth 2 (two) times daily. ) 90 tablet 3 Past Week at Unknown time  . Calcium Carb-Cholecalciferol (CALCIUM + D3) 600-200 MG-UNIT TABS Take 1 tablet by mouth daily.    Past Week at Unknown time  . Cholecalciferol (VITAMIN D-3) 5000 UNITS TABS Take 1 tablet by mouth daily.    Past Week at Unknown time  . doxazosin (CARDURA) 8 MG tablet Take 1 tablet (8 mg total) by mouth at bedtime. 90 tablet 3 Past Week at Unknown time  . gabapentin (NEURONTIN) 100 MG capsule Take 1 capsule (100 mg total) by mouth at bedtime. 30 capsule 1 Past Week at Unknown time  . levothyroxine (SYNTHROID) 50 MCG tablet TAKE 1 TABLET DAILY (Patient taking differently: Take 50 mcg by mouth daily before breakfast. ) 90 tablet 1 Past Week at Unknown time  . mirtazapine (REMERON) 15 MG tablet  Take 1 tablet (15 mg total) by mouth at bedtime. 30 tablet 1 Past Week at Unknown time  . Multiple Vitamin (MULTIVITAMIN) tablet Take 1 tablet by mouth daily.     Past Week at Unknown time  . olopatadine (PATANOL) 0.1 % ophthalmic solution Place 1 drop into both eyes 2 (two) times daily.    Past Week at Unknown time  . ramipril (ALTACE) 10 MG capsule Take 1 capsule (10 mg total) by mouth daily. 90 capsule 1 Past Week at Unknown time  . rosuvastatin (CRESTOR) 5 MG tablet Take 1 tablet (5 mg total) by mouth daily. as directed (Patient taking differently: Take 5 mg by mouth every Monday, Wednesday, and Friday. ) 90 tablet 3 Past Week at Unknown time  . triamterene-hydrochlorothiazide (MAXZIDE-25) 37.5-25 MG tablet Take 1 tablet by mouth  daily.   Past Week at Unknown time  . vitamin C (ASCORBIC ACID) 500 MG tablet Take 500 mg by mouth daily.    Past Week at Unknown time  . XIIDRA 5 % SOLN Apply 1 drop to eye daily.    Past Week at Unknown time  . furosemide (LASIX) 40 MG tablet Take 1 tablet (40 mg total) by mouth daily. (Patient not taking: Reported on 09/16/2019) 30 tablet 3 Not Taking at Unknown time  . HYDROcodone-acetaminophen (NORCO/VICODIN) 5-325 MG tablet Take 1 tablet by mouth every 4 (four) hours as needed. (Patient not taking: Reported on 09/16/2019) 15 tablet 0 Completed Course at Unknown time  . warfarin (COUMADIN) 5 MG tablet TAKE AS DIRECTED (Patient taking differently: Take 2.5 mg by mouth See admin instructions. Take 2.64m once daily except on Wednesdays.) 90 tablet 3 09/14/2019 at 1800    Assessment: [id number] with h/o Afib on warfarin presented to the ED with weakness and poor oral itnake. FOund to have drop in hemoglobin with supratherapeutic INR 6.1. INR was reversed with one dose of oral vitamin K 5 mg.   INR down to 2.4 today. H/H improving. Plt wnl.   Home warfarin dose: 5 mg daily except 2.5 mg on Wednesday   Goal of Therapy:  INR 2-3 Monitor platelets by anticoagulation protocol: Yes   Plan:  -Warfarin 2 mg x 1 dose today -Monitor daily PT/INR -Monitor closely for s/s of bleeding    BAlbertina Parr PharmD., BCPS Clinical Pharmacist Clinical phone for 09/17/19 until 5pm: [phone]

## 2019-09-17 NOTE — Progress Notes (Signed)
PROGRESS NOTE  Dalton Vaughan  VCB:[id number] DOB: 04-Feb-1929 DOA: 09/15/2019 PCP: [name], DO  Outpatient Specialists: Orthopedics, Dr. Wynelle Link  Brief Narrative: Dalton Vaughan is an 85 y.o. male with a history of SSS, atrial fibrillation on coumadin, CAD, and hypothyroidism who presented from PCP's office to [facility] ED with weakness in the setting of poor per oral intake. He was found to have 1g drop in hgb, worsening supratherapeutic INR, and lower blood pressure at PCP's office. In the ED, he was hypoxic with ambulation with infiltrates on CT chest and positive covid-19 testing for which he was admitted to [facility] and started on remdesivir and decadron. He had been caring for his wife intensively for 6-7 months during a decline in her health which culminated in hospitalization and subsequent hospice placement due to end-stage multiple myeloma. While caretaking he experienced a meniscus tear complicated by hemarthrosis for which arthroscopy was performed earlier this month. Since that time, while he has been able to use the knee more, he has had progressive lack of appetite, depressed mood, change in taste. He was also found to have a supratherapeutic INR for which vitamin K was given.   Assessment & Plan: Principal Problem:   Pneumonia due to COVID-19 virus Active Problems:   Chronic atrial fibrillation (HCC)   GERD (gastroesophageal reflux disease)   Chronic anticoagulation   Benign prostatic hyperplasia   Hyperlipidemia   Depression   Hereditary and idiopathic peripheral neuropathy   Aortic atherosclerosis (HCC)   Acute respiratory failure with hypoxia (HCC)  Acute hypoxic respiratory failure due to covid-19 pneumonia:  - Continue remdesivir 9/30 - 10/4. - Continue steroids 9/30 - 10/9 - Continue airborne, contact precautions. PPE including surgical gown, gloves, cap, shoe covers, and CAPR used during this encounter in a negative pressure room.  - Check daily labs: CBC w/diff, CMP,  d-dimer, ferritin, CRP - Avoid NSAIDs - Recommend proning and aggressive use of incentive spirometry. - Goals of care were discussed. Prognosis is guarded.   Chronic atrial fibrillation:  - Holding anticoagulation, but would benefit long term from reinitiation. CHA2DS2-VASc score is 7.  - RVR noted, hx metoprolol use in the past remotely, none recently. Will restart telemetry and give low dose metoprolol (hold parameters for bradycardia or hypotension).   CAD s/p CABG 1999 by Dr. Servando Snare.  - No angina, hs-Tn wnl. No longer on beta blocker due to SSS/bradycardia presumably. - Continue MWF statin.  Supratherapeutic INR: Likely due to minimal per oral intake. Resolved s/p vitamin K 40m po x1 9/29.  - INR at goal currently, anticipate further diminution, will consult pharmacy for assistance with coumadin dosing and continue trending INR. Due to recent hematoma and reported history of bleeding, will aim for lower end of goal 2-3 INR.   Constipation: Benign abd. LBM 5 days ago.  - Senna scheduled, schedule miralax, continue prn miralax 2nd dose and dulcolax suppository prn.  Moderate protein-calorie malnutrition:  - Dietitian consulted.  - Continue remeron  Suprapatellar pouch hematoma: s/p debridement and right knee arthroscopy, MUA on 08/28/2019 by Dr. Case. No evidence of recurrent hematoma, minimal effusion, no signs or symptoms of septic arthritis.  - PT/OT, OOB   Hypothyroidism: TSH 2.850 - Continue synthroid.   Glaucoma:  - Continue home gtt's  Grief reaction: Appropriately processing the recent death of his wife.  - Continue remeron - Consider outpatient counseling.  DVT prophylaxis: SCDs, coumadin Code Status: Full Family Communication: None at bedside Disposition Plan: Uncertain. PT/OT consulted.   Consultants:  None  Procedures:   None  Antimicrobials:  Remdesivir 9/30 - 10/4   Subjective: Eating a little better, but feel abdominal fullness and hasn't had  BM in 5 days. +flatus. Shortness of breath has improved, no bleeding noted. Right knee pain is improved, got up to chair w/PT today.    Objective: Vitals:   09/16/19 2000 09/17/19 0500 09/17/19 0528 09/17/19 0808  BP: 128/86  (!) 135/93 124/78  Pulse: 95  68 94  Resp: 14  18 18   Temp: 98.2 F (36.8 C) 97.7 F (36.5 C) 97.8 F (36.6 C) (!) 97.2 F (36.2 C)  TempSrc: Oral  Oral Oral  SpO2: 96%  91% 92%  Weight:      Height:       No intake or output data in the 24 hours ending 09/17/19 0919 Filed Weights   09/15/19 1336 09/15/19 2300  Weight: 74.4 kg 70 kg   Gen: Elderly male in no distress in chair Pulm: Nonlabored breathing room air. Clear. CV: Irreg irreg. No murmur, rub, or gallop. No JVD, no dependent edema. GI: Abdomen soft, non-tender, non-distended, with normoactive bowel sounds.  Ext: Warm, no deformities Skin: No rashes, lesions or ulcers on visualized skin. Neuro: Alert and oriented. No focal neurological deficits. Psych: Judgement and insight appear fair. Mood euthymic & affect congruent. Behavior is appropriate.    Data Reviewed: I have personally reviewed following labs and imaging studies  CBC: Recent Labs  Lab 09/15/19 1417 09/16/19 0130 09/17/19 0252  WBC 8.0 7.3 13.0*  NEUTROABS  --  5.8 10.4*  HGB 12.5* 10.8* 11.9*  HCT 39.2 33.9* 37.1*  MCV 92.0 90.6 89.6  PLT 225 215 166   Basic Metabolic Panel: Recent Labs  Lab 09/15/19 1417 09/16/19 0130 09/17/19 0252  NA 135 138 136  K 3.6 3.6 4.1  CL 101 106 103  CO2 25 23 24   GLUCOSE 121* 116* 139*  BUN 18 15 21   CREATININE 0.90 0.71 0.88  CALCIUM 9.2 8.7* 9.1   GFR: Estimated Creatinine Clearance: 57.4 mL/min (by C-G formula based on SCr of 0.88 mg/dL). Liver Function Tests: Recent Labs  Lab 09/16/19 0130 09/17/19 0252  AST 23 29  ALT 14 19  ALKPHOS 113 119  BILITOT 0.7 0.5  PROT 6.1* 6.3*  ALBUMIN 2.8* 2.7*   No results for input(s): LIPASE, AMYLASE in the last 168 hours. No  results for input(s): AMMONIA in the last 168 hours. Coagulation Profile: Recent Labs  Lab 09/15/19 1123 09/15/19 1220 09/15/19 1417 09/16/19 0130 09/17/19 0252  INR 7.7* 6.9* 6.1* 5.7* 2.4*   Cardiac Enzymes: No results for input(s): CKTOTAL, CKMB, CKMBINDEX, TROPONINI in the last 168 hours. BNP (last 3 results) Recent Labs    11/07/18 1615  PROBNP 2,681*   HbA1C: No results for input(s): HGBA1C in the last 72 hours. CBG: No results for input(s): GLUCAP in the last 168 hours. Lipid Profile: No results for input(s): CHOL, HDL, LDLCALC, TRIG, CHOLHDL, LDLDIRECT in the last 72 hours. Thyroid Function Tests: No results for input(s): TSH, T4TOTAL, FREET4, T3FREE, THYROIDAB in the last 72 hours. Anemia Panel: Recent Labs    09/15/19 1417 09/16/19 0130  FERRITIN 855* 726*   Urine analysis:    Component Value Date/Time   COLORURINE YELLOW 07/31/2012 1452   APPEARANCEUR Clear 10/30/2018 1556   LABSPEC 1.009 07/31/2012 1452   PHURINE 7.0 07/31/2012 1452   GLUCOSEU Negative 10/30/2018 1556   HGBUR NEGATIVE 07/31/2012 1452   BILIRUBINUR Negative 10/30/2018 1556  KETONESUR 15 (A) 07/31/2012 1452   PROTEINUR Negative 10/30/2018 1556   PROTEINUR NEGATIVE 07/31/2012 1452   UROBILINOGEN negative 11/03/2013 1602   UROBILINOGEN 0.2 07/31/2012 1452   NITRITE Negative 10/30/2018 1556   NITRITE NEGATIVE 07/31/2012 1452   LEUKOCYTESUR Negative 10/30/2018 1556   Recent Results (from the past 240 hour(s))  SARS Coronavirus 2 [facility] order, Performed in [facility] hospital lab) Nasopharyngeal Nasopharyngeal Swab     Status: Abnormal   Collection Time: 09/15/19  4:59 PM   Specimen: Nasopharyngeal Swab  Result Value Ref Range Status   SARS Coronavirus 2 POSITIVE (A) NEGATIVE Final    Comment: RESULT CALLED TO, READ BACK BY AND VERIFIED WITH: TALBET,T AT 2040 ON 9.29.20 BY ISLEY,B (NOTE) If result is NEGATIVE SARS-CoV-2 target nucleic acids are NOT DETECTED. The SARS-CoV-2 RNA  is generally detectable in upper and lower  respiratory specimens during the acute phase of infection. The lowest  concentration of SARS-CoV-2 viral copies this assay can detect is 250  copies / mL. A negative result does not preclude SARS-CoV-2 infection  and should not be used as the sole basis for treatment or other  patient management decisions.  A negative result may occur with  improper specimen collection / handling, submission of specimen other  than nasopharyngeal swab, presence of viral mutation(s) within the  areas targeted by this assay, and inadequate number of viral copies  (<250 copies / mL). A negative result must be combined with clinical  observations, patient history, and epidemiological information. If result is POSITIVE SARS-CoV-2 target nucleic acids are DETECTED.  The SARS-CoV-2 RNA is generally detectable in upper and lower  respiratory specimens during the acute phase of infection.  Positive  results are indicative of active infection with SARS-CoV-2.  Clinical  correlation with patient history and other diagnostic information is  necessary to determine patient infection status.  Positive results do  not rule out bacterial infection or co-infection with other viruses. If result is PRESUMPTIVE POSTIVE SARS-CoV-2 nucleic acids MAY BE PRESENT.   A presumptive positive result was obtained on the submitted specimen  and confirmed on repeat testing.  While 2019 novel coronavirus  (SARS-CoV-2) nucleic acids may be present in the submitted sample  additional confirmatory testing may be necessary for epidemiological  and / or clinical management purposes  to differentiate between  SARS-CoV-2 and other Sarbecovirus currently known to infect humans.  If clinically indicated additional testing with an alternate test  methodology [phone])  is advised. The SARS-CoV-2 RNA is generally  detectable in upper and lower respiratory specimens during the acute  phase of  infection. The expected result is Negative. Fact Sheet for Patients:  StrictlyIdeas.no Fact Sheet for Healthcare Providers: BankingDealers.co.za This test is not yet approved or cleared by the [place] FDA and has been authorized for detection and/or diagnosis of SARS-CoV-2 by FDA under an Emergency Use Authorization (EUA).  This EUA will remain in effect (meaning this test can be used) for the duration of the COVID-19 declaration under Section 564(b)(1) of the Act, 21 U.S.C. section 360bbb-3(b)(1), unless the authorization is terminated or revoked sooner. Performed at [facility], [address], [place], [place] [address]       Radiology Studies: Dg Chest 2 View  Result Date: 09/15/2019 CLINICAL DATA:  85 year old presenting with acute onset of cough. EXAM: CHEST - 2 VIEW COMPARISON:  12/01/2018 and earlier. FINDINGS: AP ERECT and LATERAL images were obtained. Prior sternotomy for CABG. Cardiac silhouette mildly to moderately enlarged, unchanged. Thoracic  aorta tortuous and atherosclerotic, unchanged. Hilar and mediastinal contours otherwise unremarkable. Mild linear atelectasis in the RIGHT MIDDLE LOBE. Lungs otherwise clear. Pulmonary vascularity normal. Bronchovascular markings normal. No pleural effusions. Degenerative changes involving the thoracic and visualized lumbar spine. IMPRESSION: 1. Mild linear atelectasis in the RIGHT MIDDLE LOBE. No acute cardiopulmonary disease otherwise. 2. Stable cardiomegaly without pulmonary edema. Electronically Signed   By: Evangeline Dakin M.D.   On: 09/15/2019 14:52   Ct Chest W Contrast  Result Date: 09/15/2019 CLINICAL DATA:  Cough for several weeks with shortness of Breath EXAM: CT CHEST WITH CONTRAST TECHNIQUE: Multidetector CT imaging of the chest was performed during intravenous contrast administration. CONTRAST:  65m OMNIPAQUE IOHEXOL 300 MG/ML  SOLN COMPARISON:  Chest x-Damiean from earlier in  the same day, CT of the abdomen and pelvis from 08/08/2007 and CT of the chest from 02/26/2011. FINDINGS: Cardiovascular: Atherosclerotic changes of the thoracic aorta are noted. No aneurysmal dilatation is seen. Mild cardiomegaly is noted. Postsurgical changes consistent with coronary bypass grafting are noted. Native coronary calcifications are seen. Mediastinum/Nodes: Thoracic inlet is within normal limits. No sizable hilar or mediastinal adenopathy is noted. The esophagus is within normal limits. Lungs/Pleura: Lungs are well aerated bilaterally. Some scattered peripheral ground-glass opacities are identified within both lungs. No sizable effusion or pneumothorax is seen. No sizable nodules are noted. Upper Abdomen: Visualized upper abdomen is within normal limits. Stable dilatation of the collecting system is noted when compared with 2008. Musculoskeletal: Degenerative changes of the thoracic spine are noted. IMPRESSION: Patchy ground-glass infiltrates identified bilaterally without associated effusion. This could represent an atypical pneumonia although the possibility of COVID-19 deserves consideration. Correlation with testing is recommended. No other acute abnormality is noted. Aortic Atherosclerosis (ICD10-I70.0). Electronically Signed   By: MInez CatalinaM.D.   On: 09/15/2019 16:38    Scheduled Meds:  dexamethasone  6 mg Oral Daily   doxazosin  8 mg Oral QHS   gabapentin  100 mg Oral QHS   levothyroxine  50 mcg Oral QAC breakfast   Lifitegrast  1 drop Ophthalmic Daily   metoprolol tartrate  12.5 mg Oral BID   mirtazapine  15 mg Oral QHS   olopatadine  1 drop Both Eyes BID   rosuvastatin  5 mg Oral Q M,W,F   Continuous Infusions:  remdesivir 100 mg in NS 250 mL       LOS: 2 days   Time spent: 35 minutes.  RPatrecia Pour MD [facility] [email or web address] Password [email or web address] 09/17/2019, 9:19 AM

## 2019-09-17 NOTE — Progress Notes (Signed)
Initial Nutrition Assessment RD working remotely.  DOCUMENTATION CODES:   Not applicable  INTERVENTION:    Ensure Enlive po TID, each supplement provides 350 kcal and 20 grams of protein.   Pt receiving Hormel Shake daily with Breakfast which provides 520 kcals and 22 g of protein and Magic cup BID with lunch and dinner, each supplement provides 290 kcal and 9 grams of protein, automatically on meal trays to optimize nutritional intake.    Encourage intake of meals and supplements.   NUTRITION DIAGNOSIS:   Increased nutrient needs related to acute illness(COVID) as evidenced by estimated needs.  GOAL:   Patient will meet greater than or equal to 90% of their needs  MONITOR:   PO intake, Supplement acceptance  REASON FOR ASSESSMENT:   Consult Assessment of nutrition requirement/status  ASSESSMENT:   85 yo male admitted with weakness and poor intake, tested positive for COVID-19. PMH includes SSS, A fib, CAD, hypothyroidism.   Unable to speak with patient. Patient consumed 90% of breakfast today.  Weight encounters reviewed. Weight trending down since February 2020. Patient with 14% weight loss within the past month, which is significant for the time frame. Current weight is 13.5% below ideal weight. Suspect patient is malnourished; unable to obtain enough information at this time for identification of malnutrition.   Labs reviewed. Medications reviewed and include decadron, remeron, miralax, senokot-s.  NUTRITION - FOCUSED PHYSICAL EXAM:  deferred  Diet Order:   Diet Order            Diet Heart Room service appropriate? Yes; Fluid consistency: Thin  Diet effective now              EDUCATION NEEDS:   Not appropriate for education at this time  Skin:  Skin Assessment: Reviewed RN Assessment  Last BM:  9/28  Height:   Ht Readings from Last 1 Encounters:  09/15/19 6' (1.829 m)    Weight:   Wt Readings from Last 1 Encounters:  09/15/19 70 kg     Ideal Body Weight:  80.9 kg  BMI:  Body mass index is 20.93 kg/m.  Estimated Nutritional Needs:   Kcal:  2050-2250  Protein:  100-120 gm  Fluid:  >/= 2 L    Molli Barrows, RD, LDN, [place] Pager [phone] After Hours Pager [phone]

## 2019-09-18 LAB — CBC WITH DIFFERENTIAL/PLATELET
Abs Immature Granulocytes: 0.26 10*3/uL — ABNORMAL HIGH (ref 0.00–0.07)
Basophils Absolute: 0 10*3/uL (ref 0.0–0.1)
Basophils Relative: 0 %
Eosinophils Absolute: 0 10*3/uL (ref 0.0–0.5)
Eosinophils Relative: 0 %
HCT: 34.2 % — ABNORMAL LOW (ref 39.0–52.0)
Hemoglobin: 11 g/dL — ABNORMAL LOW (ref 13.0–17.0)
Immature Granulocytes: 2 %
Lymphocytes Relative: 12 %
Lymphs Abs: 1.8 10*3/uL (ref 0.7–4.0)
MCH: 28.9 pg (ref 26.0–34.0)
MCHC: 32.2 g/dL (ref 30.0–36.0)
MCV: 90 fL (ref 80.0–100.0)
Monocytes Absolute: 0.7 10*3/uL (ref 0.1–1.0)
Monocytes Relative: 5 %
Neutro Abs: 12.6 10*3/uL — ABNORMAL HIGH (ref 1.7–7.7)
Neutrophils Relative %: 81 %
Platelets: 346 10*3/uL (ref 150–400)
RBC: 3.8 MIL/uL — ABNORMAL LOW (ref 4.22–5.81)
RDW: 14.5 % (ref 11.5–15.5)
WBC: 15.3 10*3/uL — ABNORMAL HIGH (ref 4.0–10.5)
nRBC: 0 % (ref 0.0–0.2)

## 2019-09-18 LAB — COMPREHENSIVE METABOLIC PANEL
ALT: 33 U/L (ref 0–44)
AST: 43 U/L — ABNORMAL HIGH (ref 15–41)
Albumin: 2.7 g/dL — ABNORMAL LOW (ref 3.5–5.0)
Alkaline Phosphatase: 111 U/L (ref 38–126)
Anion gap: 8 (ref 5–15)
BUN: 28 mg/dL — ABNORMAL HIGH (ref 8–23)
CO2: 26 mmol/L (ref 22–32)
Calcium: 8.8 mg/dL — ABNORMAL LOW (ref 8.9–10.3)
Chloride: 102 mmol/L (ref 98–111)
Creatinine, Ser: 0.85 mg/dL (ref 0.61–1.24)
GFR calc Af Amer: >60 mL/min (ref >60)
GFR calc non Af Amer: >60 mL/min (ref >60)
Glucose, Bld: 142 mg/dL — ABNORMAL HIGH (ref 70–99)
Potassium: 4.6 mmol/L (ref 3.5–5.1)
Sodium: 136 mmol/L (ref 135–145)
Total Bilirubin: 0.3 mg/dL (ref 0.3–1.2)
Total Protein: 6 g/dL — ABNORMAL LOW (ref 6.5–8.1)

## 2019-09-18 LAB — PROTIME-INR
INR: 2.6 — ABNORMAL HIGH (ref 0.8–1.2)
Prothrombin Time: 27.6 seconds — ABNORMAL HIGH (ref 11.4–15.2)

## 2019-09-18 LAB — C-REACTIVE PROTEIN: CRP: 3.1 mg/dL — ABNORMAL HIGH (ref <1.0)

## 2019-09-18 MED ORDER — WARFARIN SODIUM 1 MG PO TABS
1.5000 mg | ORAL_TABLET | Freq: Once | ORAL | Status: AC
  Administered 2019-09-18: 1.5 mg via ORAL
  Filled 2019-09-18: qty 1

## 2019-09-18 MED ORDER — DEXAMETHASONE 4 MG PO TABS
2.0000 mg | ORAL_TABLET | Freq: Every day | ORAL | Status: DC
  Administered 2019-09-19 – 2019-09-20 (×2): 2 mg via ORAL
  Filled 2019-09-18 (×2): qty 1

## 2019-09-18 NOTE — Progress Notes (Signed)
Physical Therapy Treatment Patient Details Name: Dalton Vaughan MRN: [id number] DOB: [birth year] Today's Date: 09/18/2019    History of Present Illness 85 y.o. male with medical history significant for atrial fibrillation with sick sinus syndrome, congestive heart failure, hypertension, coronary artery disease, lumbar disc herniation presented to the ED 09/15/19 with complaints of difficulty breathing , weakness when walking since his recent knee surgery for a  spontaneous bleed into his knee following arthrocopic surger for torm meniscus 8/20. Patient lives alone. Sent to ED by PCP due to INR 8.0; O2 sats 88% on room air; COVID +. Wife  passed away 5 days PTA.    PT Comments    The patient  Reports feeling better and ready to get out of here." I may be old but I have things that I need to do".  patient ambulated x 250' using RW, gait is steady. HR max 143, mostly ranging 98-120. Much more energy today for mobility.    Follow Up Recommendations  Supervision/Assistance - 24 hour;Home health PT     Equipment Recommendations  3in1 (PT)    Recommendations for Other Services       Precautions / Restrictions Precautions Precautions: Fall    Mobility  Bed Mobility Overal bed mobility: Independent                Transfers   Equipment used: Rolling walker (2 wheeled)   Sit to Stand: Modified independent (Device/Increase time)            Ambulation/Gait Ambulation/Gait assistance: Supervision Gait Distance (Feet): 250 Feet Assistive device: Rolling walker (2 wheeled) Gait Pattern/deviations: Step-through pattern Gait velocity: decr   General Gait Details: managed multiple turn s and around objects well in room   Stairs             Wheelchair Mobility    Modified Rankin (Stroke Patients Only)       Balance     Sitting balance-Leahy Scale: Good     Standing balance support: During functional activity;No upper extremity supported Standing balance-Leahy  Scale: Fair                              Cognition Arousal/Alertness: Awake/alert Behavior During Therapy: WFL for tasks assessed/performed                                   General Comments: very talkative today, appears more cheerful      Exercises      General Comments        Pertinent Vitals/Pain Pain Assessment: Faces Faces Pain Scale: Hurts a little bit Pain Location: tailbone from sitting Pain Descriptors / Indicators: Discomfort Pain Intervention(s): Repositioned    Home Living                      Prior Function            PT Goals (current goals can now be found in the care plan section) Progress towards PT goals: Progressing toward goals    Frequency    Min 3X/week      PT Plan Current plan remains appropriate    Co-evaluation              AM-PAC PT "6 Clicks" Mobility   Outcome Measure  Help needed turning from your back to your side while in a flat bed  without using bedrails?: None Help needed moving from lying on your back to sitting on the side of a flat bed without using bedrails?: None Help needed moving to and from a bed to a chair (including a wheelchair)?: None Help needed standing up from a chair using your arms (e.g., wheelchair or bedside chair)?: None Help needed to walk in hospital room?: A Little Help needed climbing 3-5 steps with a railing? : A Little 6 Click Score: 22    End of Session   Activity Tolerance: Patient tolerated treatment well Patient left: in bed;with call bell/phone within reach Nurse Communication: Mobility status PT Visit Diagnosis: Unsteadiness on feet (R26.81);Difficulty in walking, not elsewhere classified (R26.2);Other abnormalities of gait and mobility (R26.89)     Time: 1440-1530 PT Time Calculation (min) (ACUTE ONLY): 50 min  Charges:  $Gait Training: 38-52 mins                     Tresa Endo PT Acute Rehabilitation Services Pager  [phone] Office [phone]    Claretha Cooper 09/18/2019, 3:35 PM

## 2019-09-18 NOTE — TOC Progression Note (Signed)
Transition of Care [facility]) - Progression Note    Patient Details  Name: Faysal Fenoglio MRN: [id number] Date of Birth: [birth year]  [facility]) CM/SW Contact  Loletha Grayer Beverely Pace, RN Phone Number: 09/18/2019, 3:42 PM  Clinical Narrative:  Case manager spoke with patient via telephone concerning therapist recommendation for [facility]. Patient is agreeable and requests [facility]. CM explained to him that the name has changed so when he gets phone call from someone with [facility] to not be alarmed. Patient was very gracious and appreciated the information. Patient says he has a 3in1 at home, has no other DME need. Referral for Home Health therapy was called to [place], [facility] Liaison. Patient has been on RA and will not require oxygen unless something change prior to discharge.     Expected Discharge Plan: [place] Barriers to Discharge: Continued Medical Work up  Expected Discharge Plan and Services Expected Discharge Plan: [place]   Discharge Planning Services: CM Consult Post Acute Care Choice: Home Health, Durable Medical Equipment Living arrangements for the past 2 months: Single Family Home                 DME Arranged: N/A             Date HH Agency Contacted: 09/18/19 Time HH Agency Contacted: 1859     Social Determinants of Health (SDOH) Interventions    Readmission Risk Interventions No flowsheet data found.

## 2019-09-18 NOTE — Progress Notes (Signed)
Received referral from Santiago Glad, [place] on 10/1 re: pt's wife [name]) recently passing  Provide support with pt 10/2 via phone while rounding at [facility].   Mr Guerrant was Nancy's primary caregiver through multiple myeloma and osteoporosis since 2008.  Chaplain provided empathic presence, created space for reflection and engaged in life review and narrative grief work with Mr Sacks.   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  He and [name] traveled the world together with the [employer].  Mr Mattern tells stories of [name] loving hunting and fishing and they hunted sheep and boars in the mountains of [place] and fished in the [place].  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  He speaks of the mantra his mother always told him - "do something good for yourself" - and states "I didn't know what this means, but I feel like this is getting me through these moments"   He states he is hopeful to discharge - either to rehab or home - and ultimately fix up the house he built for he and his spouse so that he can share it with his son and grandson.      Chaplain will follow for continued support with Mr Skyles around hospitalization, grief and loss, life change     Thanks to PT for referral.    Clinical time 80 minutes.        Gaylord Shih, MDiv, [facility]

## 2019-09-18 NOTE — Progress Notes (Signed)
PROGRESS NOTE  Dalton Vaughan  WER:[id number] DOB: 1929/08/23 DOA: 09/15/2019 PCP: [name], DO  Outpatient Specialists: Orthopedics, Dr. Wynelle Link  Brief Narrative: Dalton Vaughan is an 85 y.o. male with a history of SSS, atrial fibrillation on coumadin, CAD, and hypothyroidism who presented from PCP's office to [facility] ED with weakness in the setting of poor per oral intake. He was found to have 1g drop in hgb, worsening supratherapeutic INR, and lower blood pressure at PCP's office. In the ED, he was hypoxic with ambulation with infiltrates on CT chest and positive covid-19 testing for which he was admitted to [facility] and started on remdesivir and decadron. He had been caring for his wife intensively for 6-7 months during a decline in her health which culminated in hospitalization and subsequent hospice placement due to end-stage multiple myeloma. While caretaking he experienced a meniscus tear complicated by hemarthrosis for which arthroscopy was performed earlier this month. Since that time, while he has been able to use the knee more, he has had progressive lack of appetite, depressed mood, change in taste. He was also found to have a supratherapeutic INR for which vitamin K was given.   Assessment & Plan: Principal Problem:   Pneumonia due to COVID-19 virus Active Problems:   Chronic atrial fibrillation (HCC)   GERD (gastroesophageal reflux disease)   Chronic anticoagulation   Benign prostatic hyperplasia   Hyperlipidemia   Depression   Hereditary and idiopathic peripheral neuropathy   Aortic atherosclerosis (HCC)   Acute respiratory failure with hypoxia (HCC)  Acute hypoxic respiratory failure due to covid-19 pneumonia:  - Weaned to room air 10/1. - Continue remdesivir 9/30 - 10/4. - Continue steroids 9/30 - 10/9. Decrease dose starting 10/3. - Continue airborne, contact precautions. PPE including surgical gown, gloves, cap, shoe covers, and CAPR used during this encounter in a negative  pressure room.  - Check daily labs: CBC w/diff, CMP, d-dimer, ferritin, CRP. Inflammatory markers declining. - Avoid NSAIDs - Recommend proning and aggressive use of incentive spirometry. - Goals of care were discussed  Chronic atrial fibrillation: with intermittent RVR improving with metoprolol. Telemetry personally reviewed this morning by myself showing afib and 2.32 second pause noted 10/2 at 1:16am. Will not increase dose of metoprolol with hx SSS/bradycardia, despite intermittent RVR with exertion.  - Holding anticoagulation, but would benefit long term from reinitiation. CHA2DS2-VASc score is 7.  - Continue low dose metoprolol (hold parameters for bradycardia or hypotension).  - Decrease dose of steroids since no wheezing, improving respiratory status and inflammatory markers and with suspicion that this may be contributing to tachycardia.  CAD s/p CABG 1999 by Dr. Servando Snare.  - No angina, hs-Tn wnl. No longer on beta blocker due to SSS/bradycardia presumably. - Continue MWF statin.  Supratherapeutic INR: Likely due to minimal per oral intake. Resolved s/p vitamin K 5m po x1 9/29.  - INR at goal 10/1, restarted lower dose coumadin and will continue to monitor INR daily. Due to recent hematoma and reported history of bleeding, will aim for lower end of goal 2-3 INR.   Constipation: Benign abd. LBM 5 days ago.  - Senna scheduled, schedule miralax, continue prn miralax 2nd dose and dulcolax suppository prn. Continue this.  Moderate protein-calorie malnutrition:  - Dietitian consulted.  - Continue remeron  Suprapatellar pouch hematoma: s/p debridement and right knee arthroscopy, MUA on 08/28/2019 by Dr. Case. No evidence of recurrent hematoma, minimal effusion, no signs or symptoms of septic arthritis.  - PT/OT, OOB  - Follow up  as scheduled with outpatient providers.  Hypothyroidism: TSH 2.850 - Continue synthroid.   Glaucoma:  - Continue home gtt's  Grief reaction:  Appropriately processing the recent death of his wife.  - Continue remeron - Consider outpatient counseling.  DVT prophylaxis: SCDs, coumadin Code Status: Full Family Communication: None at bedside Disposition Plan: Anticipate DC home after final dose of remdesivir on 10/4. Has assistance at home with multiple family members. CM consulted as home health would be of benefit to the patient. Consultants:   None  Procedures:   None  Antimicrobials:  Remdesivir 9/30 - 10/4   Subjective: Breathing better, cough is stable, able to max out incentive spirometry. With any exertion, HR still rises >120bpm though dyspnea is improving, no chest pain. Woke up to alarms on telemetry last night but didn't feel unwell.  Objective: Vitals:   09/17/19 2143 09/18/19 0611 09/18/19 0711 09/18/19 0935  BP: 120/82 131/89 134/83   Pulse: 82 78 75 91  Resp:  18 17   Temp:  98.1 F (36.7 C) 98 F (36.7 C)   TempSrc:  Oral    SpO2:  97% 97%   Weight:      Height:        Intake/Output Summary (Last 24 hours) at 09/18/2019 1113 Last data filed at 09/18/2019 0700 Gross per 24 hour  Intake -  Output 1550 ml  Net -1550 ml   Filed Weights   09/15/19 1336 09/15/19 2300  Weight: 74.4 kg 70 kg   Gen: Pleasant elderly male in no distress Pulm: Nonlabored breathing room air. Clear. CV: Irreg irreg. No murmur, rub, or gallop. No JVD, no dependent edema. GI: Abdomen soft, non-tender, non-distended, with normoactive bowel sounds.  Ext: Warm, no deformities Skin: No rashes, lesions or ulcers on visualized skin. Neuro: Alert and oriented. No focal neurological deficits. Psych: Judgement and insight appear fair. Mood euthymic & affect congruent. Behavior is appropriate.    Data Reviewed: I have personally reviewed following labs and imaging studies  CBC: Recent Labs  Lab 09/15/19 1417 09/16/19 0130 09/17/19 0252 09/18/19 0134  WBC 8.0 7.3 13.0* 15.3*  NEUTROABS  --  5.8 10.4* 12.6*  HGB 12.5*  10.8* 11.9* 11.0*  HCT 39.2 33.9* 37.1* 34.2*  MCV 92.0 90.6 89.6 90.0  PLT 225 215 297 583   Basic Metabolic Panel: Recent Labs  Lab 09/15/19 1417 09/16/19 0130 09/17/19 0252 09/18/19 0134  NA 135 138 136 136  K 3.6 3.6 4.1 4.6  CL 101 106 103 102  CO2 25 23 24 26   GLUCOSE 121* 116* 139* 142*  BUN 18 15 21  28*  CREATININE 0.90 0.71 0.88 0.85  CALCIUM 9.2 8.7* 9.1 8.8*   GFR: Estimated Creatinine Clearance: 59.5 mL/min (by C-G formula based on SCr of 0.85 mg/dL). Liver Function Tests: Recent Labs  Lab 09/16/19 0130 09/17/19 0252 09/18/19 0134  AST 23 29 43*  ALT 14 19 33  ALKPHOS 113 119 111  BILITOT 0.7 0.5 0.3  PROT 6.1* 6.3* 6.0*  ALBUMIN 2.8* 2.7* 2.7*   No results for input(s): LIPASE, AMYLASE in the last 168 hours. No results for input(s): AMMONIA in the last 168 hours. Coagulation Profile: Recent Labs  Lab 09/15/19 1220 09/15/19 1417 09/16/19 0130 09/17/19 0252 09/18/19 0134  INR 6.9* 6.1* 5.7* 2.4* 2.6*   Cardiac Enzymes: No results for input(s): CKTOTAL, CKMB, CKMBINDEX, TROPONINI in the last 168 hours. BNP (last 3 results) Recent Labs    11/07/18 1615  PROBNP 2,681*  HbA1C: No results for input(s): HGBA1C in the last 72 hours. CBG: No results for input(s): GLUCAP in the last 168 hours. Lipid Profile: No results for input(s): CHOL, HDL, LDLCALC, TRIG, CHOLHDL, LDLDIRECT in the last 72 hours. Thyroid Function Tests: No results for input(s): TSH, T4TOTAL, FREET4, T3FREE, THYROIDAB in the last 72 hours. Anemia Panel: Recent Labs    09/15/19 1417 09/16/19 0130  FERRITIN 855* 726*   Urine analysis:    Component Value Date/Time   COLORURINE YELLOW 07/31/2012 1452   APPEARANCEUR Clear 10/30/2018 1556   LABSPEC 1.009 07/31/2012 1452   PHURINE 7.0 07/31/2012 1452   GLUCOSEU Negative 10/30/2018 1556   HGBUR NEGATIVE 07/31/2012 1452   BILIRUBINUR Negative 10/30/2018 1556   KETONESUR 15 (A) 07/31/2012 1452   PROTEINUR Negative 10/30/2018  1556   PROTEINUR NEGATIVE 07/31/2012 1452   UROBILINOGEN negative 11/03/2013 1602   UROBILINOGEN 0.2 07/31/2012 1452   NITRITE Negative 10/30/2018 1556   NITRITE NEGATIVE 07/31/2012 1452   LEUKOCYTESUR Negative 10/30/2018 1556   Recent Results (from the past 240 hour(s))  SARS Coronavirus 2 [facility] order, Performed in [facility] hospital lab) Nasopharyngeal Nasopharyngeal Swab     Status: Abnormal   Collection Time: 09/15/19  4:59 PM   Specimen: Nasopharyngeal Swab  Result Value Ref Range Status   SARS Coronavirus 2 POSITIVE (A) NEGATIVE Final    Comment: RESULT CALLED TO, READ BACK BY AND VERIFIED WITH: TALBET,T AT 2040 ON 9.29.20 BY ISLEY,B (NOTE) If result is NEGATIVE SARS-CoV-2 target nucleic acids are NOT DETECTED. The SARS-CoV-2 RNA is generally detectable in upper and lower  respiratory specimens during the acute phase of infection. The lowest  concentration of SARS-CoV-2 viral copies this assay can detect is 250  copies / mL. A negative result does not preclude SARS-CoV-2 infection  and should not be used as the sole basis for treatment or other  patient management decisions.  A negative result may occur with  improper specimen collection / handling, submission of specimen other  than nasopharyngeal swab, presence of viral mutation(s) within the  areas targeted by this assay, and inadequate number of viral copies  (<250 copies / mL). A negative result must be combined with clinical  observations, patient history, and epidemiological information. If result is POSITIVE SARS-CoV-2 target nucleic acids are DETECTED.  The SARS-CoV-2 RNA is generally detectable in upper and lower  respiratory specimens during the acute phase of infection.  Positive  results are indicative of active infection with SARS-CoV-2.  Clinical  correlation with patient history and other diagnostic information is  necessary to determine patient infection status.  Positive results do  not rule out  bacterial infection or co-infection with other viruses. If result is PRESUMPTIVE POSTIVE SARS-CoV-2 nucleic acids MAY BE PRESENT.   A presumptive positive result was obtained on the submitted specimen  and confirmed on repeat testing.  While 2019 novel coronavirus  (SARS-CoV-2) nucleic acids may be present in the submitted sample  additional confirmatory testing may be necessary for epidemiological  and / or clinical management purposes  to differentiate between  SARS-CoV-2 and other Sarbecovirus currently known to infect humans.  If clinically indicated additional testing with an alternate test  methodology [id number])  is advised. The SARS-CoV-2 RNA is generally  detectable in upper and lower respiratory specimens during the acute  phase of infection. The expected result is Negative. Fact Sheet for Patients:  StrictlyIdeas.no Fact Sheet for Healthcare Providers: BankingDealers.co.za This test is not yet approved or cleared by the [place]  States FDA and has been authorized for detection and/or diagnosis of SARS-CoV-2 by FDA under an Emergency Use Authorization (EUA).  This EUA will remain in effect (meaning this test can be used) for the duration of the COVID-19 declaration under Section 564(b)(1) of the Act, 21 U.S.C. section 360bbb-3(b)(1), unless the authorization is terminated or revoked sooner. Performed at [facility] [address] [place], [place] [address]       Radiology Studies: No results found.  Scheduled Meds: . dexamethasone  6 mg Oral Daily  . doxazosin  8 mg Oral QHS  . feeding supplement (ENSURE ENLIVE)  237 mL Oral TID BM  . gabapentin  100 mg Oral QHS  . levothyroxine  50 mcg Oral QAC breakfast  . Lifitegrast  1 drop Ophthalmic Daily  . metoprolol tartrate  12.5 mg Oral BID  . mirtazapine  15 mg Oral QHS  . olopatadine  1 drop Both Eyes BID  . polyethylene glycol  17 g Oral Daily  . rosuvastatin  5 mg Oral Q  M,W,F  . senna-docusate  1 tablet Oral Daily  . Warfarin - Pharmacist Dosing Inpatient   Does not apply q1800   Continuous Infusions: . remdesivir 100 mg in NS 250 mL 100 mg (09/18/19 0944)     LOS: 3 days   Time spent: 35 minutes.  Patrecia Pour, MD [facility] [email or web address] Password [email or web address] 09/18/2019, 11:13 AM

## 2019-09-18 NOTE — Progress Notes (Signed)
Patient spoke with his son, did not want me to call him again with any further update.

## 2019-09-18 NOTE — Progress Notes (Signed)
Occupational Therapy Evaluation Patient Details Name: Dalton Vaughan MRN: [id number] DOB: 1929-11-19 Today's Date: 09/18/2019    History of Present Illness 85 y.o. male with medical history significant for atrial fibrillation with sick sinus syndrome, congestive heart failure, hypertension, coronary artery disease, lumbar disc herniation presented to the ED 09/15/19 with complaints of difficulty breathing , weakness when walking since his recent knee surgery for a  spontaneous bleed into his knee following arthrocopic surger for torm meniscus 8/20. Patient lives alone. Sent to ED by PCP due to INR 8.0; O2 sats 88% on room air; COVID +. Wife  passed away 5 days PTA.   Clinical Impression   PTA, pt recovering from recent knee surgery and was modified independent with ADL and mobility. Pt able to complete ADL and moblity within room at sink level with minguard A @ RW level. SpO2 remained above 90 on RA; moments of sustained tachycardia up to 160s. Pt states he feels "much better", but weak. Began education on theraband HEP with level 2 band. Completed incentive spirometer x 10 - able to pull 2500 ml. Will follow acutely. Do not anticipate need for OT follow up. Pt very appreciative.     Follow Up Recommendations  No OT follow up;Supervision - Intermittent    Equipment Recommendations  None recommended by OT    Recommendations for Other Services       Precautions / Restrictions Precautions Precautions: Fall Precaution Comments: watch INR , ? limit right Knee forced flexion, HR high Restrictions Weight Bearing Restrictions: No      Mobility Bed Mobility Overal bed mobility: Modified Independent                Transfers Overall transfer level: Needs assistance Equipment used: Rolling walker (2 wheeled) Transfers: Sit to/from Stand Sit to Stand: Min guard         General transfer comment: VC for hand placement    Balance Overall balance assessment: Needs assistance    Sitting balance-Leahy Scale: Good       Standing balance-Leahy Scale: Fair                             ADL either performed or assessed with clinical judgement   ADL Overall ADL's : Needs assistance/impaired     Grooming: Set up;Standing;Sitting   Upper Body Bathing: Set up;Sitting   Lower Body Bathing: Sit to/from stand;Minimal assistance Lower Body Bathing Details (indicate cue type and reason): A for R foot Upper Body Dressing : Set up;Sitting   Lower Body Dressing: Minimal assistance;Sit to/from stand Lower Body Dressing Details (indicate cue type and reason): A for R sock Toilet Transfer: Min guard;BSC;Ambulation   Toileting- Clothing Manipulation and Hygiene: Supervision/safety;Sit to/from stand       Functional mobility during ADLs: Surveyor, minerals     Praxis      Pertinent Vitals/Pain Pain Assessment: 0-10 Pain Score: 1  Pain Location: R knee and butt Pain Descriptors / Indicators: Discomfort     Hand Dominance     Extremity/Trunk Assessment Upper Extremity Assessment Upper Extremity Assessment: Generalized weakness   Lower Extremity Assessment Lower Extremity Assessment: RLE deficits/detail   Cervical / Trunk Assessment Cervical / Trunk Assessment: Normal   Communication Communication Communication: No difficulties   Cognition Arousal/Alertness: Awake/alert Behavior During Therapy: WFL for tasks assessed/performed Overall Cognitive Status: Within Functional Limits for  tasks assessed                                     General Comments  Retired from [employer] Exercises: General Upper Extremity;Other exercises General Exercises - Upper Extremity Shoulder Flexion: Strengthening;Both;15 reps;Theraband Theraband Level (Shoulder Flexion): Level 2 (Red) Shoulder ABduction: Strengthening;Both;15 reps;Theraband Theraband Level (Shoulder Abduction): Level 2  (Red) Elbow Flexion: Strengthening;Both;15 reps;Seated Other Exercises Other Exercises: incentive spirometer x 10 able to pull 2500 ml Other Exercises: chair marching   Shoulder Instructions      Home Living Family/patient expects to be discharged to:: Private residence Living Arrangements: Alone;Children;Other relatives Available Help at Discharge: Family;Available 24 hours/day(son) Type of Home: House Home Access: Stairs to enter [employer] of Steps: 4 Entrance Stairs-Rails: Right;Left Home Layout: Two level;Able to live on main level with bedroom/bathroom Alternate Level Stairs-Number of Steps: 1 step down into den; ha rail to hold onto       [employer]: Standard Bathroom Accessibility: Yes How Accessible: Accessible via walker Home Equipment: Walker - 2 wheels;Cane - single point;Shower seat - built in;Hand held shower head;Grab bars - tub/shower;Bedside commode;Walker - 4 wheels;Wheelchair - manual          Prior Functioning/Environment Level of Independence: Independent;Independent with assistive device(s)        Comments: Problems with knee started 10-Sep-2023. Wife passed away October 03, 2023        OT Problem List: Decreased strength;Decreased range of motion;Decreased activity tolerance;Decreased knowledge of use of DME or AE;Cardiopulmonary status limiting activity;Pain;Increased edema      OT Treatment/Interventions: Self-care/ADL training;Therapeutic exercise;Energy conservation;DME and/or AE instruction;Therapeutic activities;Patient/family education    OT Goals(Current goals can be found in the care plan section) Acute Rehab OT Goals Patient Stated Goal: to return home OT Goal Formulation: With patient Time For Goal Achievement: 10/02/19 Potential to Achieve Goals: Good  OT Frequency: Min 3X/week   Barriers to D/C:            Co-evaluation              AM-PAC OT "6 Clicks" Daily Activity     Outcome Measure Help from  another person eating meals?: None Help from another person taking care of personal grooming?: A Little Help from another person toileting, which includes using toliet, bedpan, or urinal?: A Little Help from another person bathing (including washing, rinsing, drying)?: A Little Help from another person to put on and taking off regular upper body clothing?: A Little Help from another person to put on and taking off regular lower body clothing?: A Little 6 Click Score: 19   End of Session Equipment Utilized During Treatment: Rolling walker Nurse Communication: Mobility status  Activity Tolerance: Patient tolerated treatment well Patient left: in chair;with call bell/phone within reach  OT Visit Diagnosis: Unsteadiness on feet (R26.81);Muscle weakness (generalized) (M62.81);Pain Pain - Right/Left: Right Pain - part of body: Knee(buttocks)                Time: 9476-5465 OT Time Calculation (min): 51 min Charges:  OT General Charges $OT Visit: 1 Visit OT Evaluation $OT Eval Moderate Complexity: 1 Mod OT Treatments $Self Care/Home Management : 8-22 mins $Therapeutic Exercise: 8-22 mins  Maurie Boettcher, OT/L   Acute OT Clinical Specialist [place] Pager [phone] Office [phone]   [facility] 09/18/2019, 10:54 AM

## 2019-09-18 NOTE — Progress Notes (Signed)
ANTICOAGULATION CONSULT NOTE - Follow Up Consult  Pharmacy Consult for warfarin Indication: atrial fibrillation  Allergies  Allergen Reactions  . Paxil [Paroxetine Hcl] Palpitations  . Eliquis [Apixaban] Other (See Comments)    dizziness  . Penicillins Other (See Comments)    Did it involve swelling of the face/tongue/throat, SOB, or low BP? Unknown Did it involve sudden or severe rash/hives, skin peeling, or any reaction on the inside of your mouth or nose? Unknown Did you need to seek medical attention at a hospital or doctor's office? Unknown When did it last happen?unk If all above answers are "NO", may proceed with cephalosporin use.   Christy Sartorius Sodium] Other (See Comments)    myalgias  . Zetia [Ezetimibe] Other (See Comments)    myalgias  . Vytorin [Ezetimibe-Simvastatin] Other (See Comments)    myalgias    Patient Measurements: Height: 6' (182.9 cm) Weight: 154 lb 5.2 oz (70 kg) IBW/kg (Calculated) : 77.6  Vital Signs: Temp: 98 F (36.7 C) (10/02 0711) Temp Source: Oral (10/02 0611) BP: 134/83 (10/02 0711) Pulse Rate: 91 (10/02 0935)  Labs: Recent Labs    09/15/19 1714 09/16/19 0130 09/17/19 0252 09/18/19 0134  HGB  --  10.8* 11.9* 11.0*  HCT  --  33.9* 37.1* 34.2*  PLT  --  215 297 346  LABPROT  --  50.2* 26.1* 27.6*  INR  --  5.7* 2.4* 2.6*  CREATININE  --  0.71 0.88 0.85  TROPONINIHS 13  --   --   --     Estimated Creatinine Clearance: 59.5 mL/min (by C-G formula based on SCr of 0.85 mg/dL).   Medical History: Past Medical History:  Diagnosis Date  . Anal fissure   . Atrial fibrillation ([place])   . BPH (benign prostatic hypertrophy)   . CAD (coronary artery disease)    Dr. Wynonia Lawman  . Cataract   . Depression 2004  . Diverticulosis   . GERD (gastroesophageal reflux disease)   . History of TIAs   . Hyperlipidemia   . Hypertension   . Hypertensive heart disease without CHF   . Hypothyroidism   . Internal hemorrhoids   .  Lumbar disc disease   . Oral bleeding 08/20/2012   Following molar tooth extraction July, 2013  . Ruptured disk 1995  . Shingles   . Thyroid disease   . Vitamin D deficiency   . Vitreous detachment     Medications:  Medications Prior to Admission  Medication Sig Dispense Refill Last Dose  . amLODipine (NORVASC) 5 MG tablet Take 1 tablet (5 mg total) by mouth daily. (Patient taking differently: Take 2.5 mg by mouth 2 (two) times daily. ) 90 tablet 3 Past Week at Unknown time  . Calcium Carb-Cholecalciferol (CALCIUM + D3) 600-200 MG-UNIT TABS Take 1 tablet by mouth daily.    Past Week at Unknown time  . Cholecalciferol (VITAMIN D-3) 5000 UNITS TABS Take 1 tablet by mouth daily.    Past Week at Unknown time  . doxazosin (CARDURA) 8 MG tablet Take 1 tablet (8 mg total) by mouth at bedtime. 90 tablet 3 Past Week at Unknown time  . gabapentin (NEURONTIN) 100 MG capsule Take 1 capsule (100 mg total) by mouth at bedtime. 30 capsule 1 Past Week at Unknown time  . levothyroxine (SYNTHROID) 50 MCG tablet TAKE 1 TABLET DAILY (Patient taking differently: Take 50 mcg by mouth daily before breakfast. ) 90 tablet 1 Past Week at Unknown time  . mirtazapine (REMERON) 15 MG  tablet Take 1 tablet (15 mg total) by mouth at bedtime. 30 tablet 1 Past Week at Unknown time  . Multiple Vitamin (MULTIVITAMIN) tablet Take 1 tablet by mouth daily.     Past Week at Unknown time  . olopatadine (PATANOL) 0.1 % ophthalmic solution Place 1 drop into both eyes 2 (two) times daily.    Past Week at Unknown time  . ramipril (ALTACE) 10 MG capsule Take 1 capsule (10 mg total) by mouth daily. 90 capsule 1 Past Week at Unknown time  . rosuvastatin (CRESTOR) 5 MG tablet Take 1 tablet (5 mg total) by mouth daily. as directed (Patient taking differently: Take 5 mg by mouth every Monday, Wednesday, and Friday. ) 90 tablet 3 Past Week at Unknown time  . triamterene-hydrochlorothiazide (MAXZIDE-25) 37.5-25 MG tablet Take 1 tablet by mouth  daily.   Past Week at Unknown time  . vitamin C (ASCORBIC ACID) 500 MG tablet Take 500 mg by mouth daily.    Past Week at Unknown time  . XIIDRA 5 % SOLN Apply 1 drop to eye daily.    Past Week at Unknown time  . furosemide (LASIX) 40 MG tablet Take 1 tablet (40 mg total) by mouth daily. (Patient not taking: Reported on 09/16/2019) 30 tablet 3 Not Taking at Unknown time  . HYDROcodone-acetaminophen (NORCO/VICODIN) 5-325 MG tablet Take 1 tablet by mouth every 4 (four) hours as needed. (Patient not taking: Reported on 09/16/2019) 15 tablet 0 Completed Course at Unknown time  . warfarin (COUMADIN) 5 MG tablet TAKE AS DIRECTED (Patient taking differently: Take 2.5 mg by mouth See admin instructions. Take 2.39m once daily except on Wednesdays.) 90 tablet 3 09/14/2019 at 1800    Assessment: 89YOM with h/o Afib on warfarin presented to the ED with weakness and poor oral itnake. FOund to have drop in hemoglobin with supratherapeutic INR 6.1. INR was reversed with one dose of oral vitamin K 5 mg.   INR stable at 2.6 today. H/H stable. Plt wnl. Meal intake charted as 90%  Home warfarin dose: 5 mg daily except 2.5 mg on Wednesday   Goal of Therapy:  INR 2-3 Monitor platelets by anticoagulation protocol: Yes   Plan:  -Warfarin 1.5 mg x 1 dose today -Monitor daily PT/INR -Monitor closely for s/s of bleeding    BAlbertina Parr PharmD., BCPS Clinical Pharmacist Clinical phone for 09/18/19 until 5pm: [phone]

## 2019-09-18 NOTE — TOC Initial Note (Signed)
Transition of Care [facility]) - Initial/Assessment Note    Patient Details  Name: Dalton Vaughan MRN: [id number] Date of Birth: 1929-04-02  Transition [facility]) CM/SW Contact:    Ninfa Meeker, RN Phone Number: [phone] (working remotely) 09/18/2019, 1:20 PM  Clinical Narrative:  85 yr old gentleman admitted from home with COVID 19. PT/OT have evaluated patient and he will not need therapy at home, will need supervision from family. Case manager contacted patient's son-Quin- [phone] to discuss family availability, he states that he and his son are moving in with his dad to care for him when he comes home. Case manager will continue to monitor for any needs at discharge as patient medically improves, may he be blessed to do so.           Expected Discharge Plan: Home/Self Care Barriers to Discharge: Continued Medical Work up   Patient Goals and CMS Choice        Expected Discharge Plan and Services Expected Discharge Plan: Home/Self Care   Discharge Planning Services: CM Consult   Living arrangements for the past 2 months: Single Family Home                                      Prior Living Arrangements/Services Living arrangements for the past 2 months: Single Family Home Lives with:: Self(Son and grandson will be staying with him)                   Activities of Daily Living Home Assistive Devices/Equipment: None ADL Screening (condition at time of admission) Patient's cognitive ability adequate to safely complete daily activities?: Yes Is the patient deaf or have difficulty hearing?: No Does the patient have difficulty seeing, even when wearing glasses/contacts?: No Does the patient have difficulty concentrating, remembering, or making decisions?: No Patient able to express need for assistance with ADLs?: Yes Does the patient have difficulty dressing or bathing?: No Independently performs ADLs?: Yes (appropriate for developmental age) Does the  patient have difficulty walking or climbing stairs?: Yes Weakness of Legs: Right Weakness of Arms/Hands: None  Permission Sought/Granted                  Emotional Assessment       Orientation: : Oriented to Self, Oriented to Place, Oriented to  Time, Oriented to Situation Alcohol / Substance Use: Not Applicable Psych Involvement: No (comment)  Admission diagnosis:  Hypoxemia [R09.02] Abnormal CT scan of lung [R91.8] Pneumonia due to COVID-19 virus [U07.1, J12.89] Patient Active Problem List   Diagnosis Date Noted  . Acute respiratory failure with hypoxia ([place]) 09/16/2019  . Pneumonia due to COVID-19 virus 09/15/2019  . Osteoarthritis of spine with radiculopathy, lumbar region 01/08/2018  . Thyroid disease   . Aortic atherosclerosis ([place]) 05/02/2017  . Thrombocytopenia ([place]) 07/10/2016  . Hereditary and idiopathic peripheral neuropathy 10/16/2015  . Mononeuropathy 10/04/2015  . Allergic rhinitis 05/06/2014  . Depression 08/03/2013  . Sick sinus syndrome ([place]) 07/31/2012  . Hypertensive heart disease without CHF   . Chronic atrial fibrillation ([place])   . Coronary artery disease involving native coronary artery of native heart with angina pectoris ([place])   . History of TIAs   . Lumbar disc disease   . GERD (gastroesophageal reflux disease)   . Chronic anticoagulation   . Benign prostatic hyperplasia   . Hyperlipidemia    PCP:  [name], DO Pharmacy:  [place], [place] [address] Phone: [phone] Fax: [phone]  [facility] [place], [place] [address] [place] [address] Phone: [phone] Fax: [place], [place] [address] Phone: [phone] Fax: [phone]     Social Determinants of Health (SDOH) Interventions    Readmission Risk  Interventions No flowsheet data found.

## 2019-09-19 LAB — CBC WITH DIFFERENTIAL/PLATELET
Abs Immature Granulocytes: 0.63 10*3/uL — ABNORMAL HIGH (ref 0.00–0.07)
Basophils Absolute: 0.1 10*3/uL (ref 0.0–0.1)
Basophils Relative: 1 %
Eosinophils Absolute: 0 10*3/uL (ref 0.0–0.5)
Eosinophils Relative: 0 %
HCT: 35.2 % — ABNORMAL LOW (ref 39.0–52.0)
Hemoglobin: 11.3 g/dL — ABNORMAL LOW (ref 13.0–17.0)
Immature Granulocytes: 4 %
Lymphocytes Relative: 14 %
Lymphs Abs: 2 10*3/uL (ref 0.7–4.0)
MCH: 29.5 pg (ref 26.0–34.0)
MCHC: 32.1 g/dL (ref 30.0–36.0)
MCV: 91.9 fL (ref 80.0–100.0)
Monocytes Absolute: 0.8 10*3/uL (ref 0.1–1.0)
Monocytes Relative: 6 %
Neutro Abs: 10.7 10*3/uL — ABNORMAL HIGH (ref 1.7–7.7)
Neutrophils Relative %: 75 %
Platelets: 380 10*3/uL (ref 150–400)
RBC: 3.83 MIL/uL — ABNORMAL LOW (ref 4.22–5.81)
RDW: 14.7 % (ref 11.5–15.5)
WBC: 14.3 10*3/uL — ABNORMAL HIGH (ref 4.0–10.5)
nRBC: 0 % (ref 0.0–0.2)

## 2019-09-19 LAB — COMPREHENSIVE METABOLIC PANEL
ALT: 52 U/L — ABNORMAL HIGH (ref 0–44)
AST: 64 U/L — ABNORMAL HIGH (ref 15–41)
Albumin: 2.7 g/dL — ABNORMAL LOW (ref 3.5–5.0)
Alkaline Phosphatase: 112 U/L (ref 38–126)
Anion gap: 6 (ref 5–15)
BUN: 31 mg/dL — ABNORMAL HIGH (ref 8–23)
CO2: 28 mmol/L (ref 22–32)
Calcium: 8.7 mg/dL — ABNORMAL LOW (ref 8.9–10.3)
Chloride: 104 mmol/L (ref 98–111)
Creatinine, Ser: 0.76 mg/dL (ref 0.61–1.24)
GFR calc Af Amer: >60 mL/min (ref >60)
GFR calc non Af Amer: >60 mL/min (ref >60)
Glucose, Bld: 149 mg/dL — ABNORMAL HIGH (ref 70–99)
Potassium: 5 mmol/L (ref 3.5–5.1)
Sodium: 138 mmol/L (ref 135–145)
Total Bilirubin: 0.7 mg/dL (ref 0.3–1.2)
Total Protein: 5.8 g/dL — ABNORMAL LOW (ref 6.5–8.1)

## 2019-09-19 LAB — PROTIME-INR
INR: 3.3 — ABNORMAL HIGH (ref 0.8–1.2)
Prothrombin Time: 33 seconds — ABNORMAL HIGH (ref 11.4–15.2)

## 2019-09-19 LAB — C-REACTIVE PROTEIN: CRP: 2.1 mg/dL — ABNORMAL HIGH (ref <1.0)

## 2019-09-19 MED ORDER — MAGNESIUM HYDROXIDE 400 MG/5ML PO SUSP
30.0000 mL | Freq: Every day | ORAL | Status: DC
  Administered 2019-09-19 – 2019-09-20 (×2): 30 mL via ORAL
  Filled 2019-09-19 (×2): qty 30

## 2019-09-19 NOTE — Progress Notes (Signed)
ANTICOAGULATION CONSULT NOTE - Follow Up Consult  Pharmacy Consult for warfarin Indication: atrial fibrillation  Allergies  Allergen Reactions  . Paxil [Paroxetine Hcl] Palpitations  . Eliquis [Apixaban] Other (See Comments)    dizziness  . Penicillins Other (See Comments)    Did it involve swelling of the face/tongue/throat, SOB, or low BP? Unknown Did it involve sudden or severe rash/hives, skin peeling, or any reaction on the inside of your mouth or nose? Unknown Did you need to seek medical attention at a hospital or doctor's office? Unknown When did it last happen?unk If all above answers are "NO", may proceed with cephalosporin use.   Christy Sartorius Sodium] Other (See Comments)    myalgias  . Zetia [Ezetimibe] Other (See Comments)    myalgias  . Vytorin [Ezetimibe-Simvastatin] Other (See Comments)    myalgias    Patient Measurements: Height: 6' (182.9 cm) Weight: 154 lb 5.2 oz (70 kg) IBW/kg (Calculated) : 77.6  Vital Signs: Temp: 98 F (36.7 C) (10/03 0714) Temp Source: Oral (10/03 0714) BP: 140/74 (10/03 1126) Pulse Rate: 98 (10/03 1126)  Labs: Recent Labs    09/17/19 0252 09/18/19 0134 09/19/19 0200  HGB 11.9* 11.0* 11.3*  HCT 37.1* 34.2* 35.2*  PLT 297 346 380  LABPROT 26.1* 27.6* 33.0*  INR 2.4* 2.6* 3.3*  CREATININE 0.88 0.85 0.76    Estimated Creatinine Clearance: 63.2 mL/min (by C-G formula based on SCr of 0.76 mg/dL).   Medical History: Past Medical History:  Diagnosis Date  . Anal fissure   . Atrial fibrillation ([place])   . BPH (benign prostatic hypertrophy)   . CAD (coronary artery disease)    Dr. Wynonia Lawman  . Cataract   . Depression 2004  . Diverticulosis   . GERD (gastroesophageal reflux disease)   . History of TIAs   . Hyperlipidemia   . Hypertension   . Hypertensive heart disease without CHF   . Hypothyroidism   . Internal hemorrhoids   . Lumbar disc disease   . Oral bleeding 08/20/2012   Following molar tooth  extraction July, 2013  . Ruptured disk 1995  . Shingles   . Thyroid disease   . Vitamin D deficiency   . Vitreous detachment     Medications:  Medications Prior to Admission  Medication Sig Dispense Refill Last Dose  . amLODipine (NORVASC) 5 MG tablet Take 1 tablet (5 mg total) by mouth daily. (Patient taking differently: Take 2.5 mg by mouth 2 (two) times daily. ) 90 tablet 3 Past Week at Unknown time  . Calcium Carb-Cholecalciferol (CALCIUM + D3) 600-200 MG-UNIT TABS Take 1 tablet by mouth daily.    Past Week at Unknown time  . Cholecalciferol (VITAMIN D-3) 5000 UNITS TABS Take 1 tablet by mouth daily.    Past Week at Unknown time  . doxazosin (CARDURA) 8 MG tablet Take 1 tablet (8 mg total) by mouth at bedtime. 90 tablet 3 Past Week at Unknown time  . gabapentin (NEURONTIN) 100 MG capsule Take 1 capsule (100 mg total) by mouth at bedtime. 30 capsule 1 Past Week at Unknown time  . levothyroxine (SYNTHROID) 50 MCG tablet TAKE 1 TABLET DAILY (Patient taking differently: Take 50 mcg by mouth daily before breakfast. ) 90 tablet 1 Past Week at Unknown time  . mirtazapine (REMERON) 15 MG tablet Take 1 tablet (15 mg total) by mouth at bedtime. 30 tablet 1 Past Week at Unknown time  . Multiple Vitamin (MULTIVITAMIN) tablet Take 1 tablet by mouth daily.  Past Week at Unknown time  . olopatadine (PATANOL) 0.1 % ophthalmic solution Place 1 drop into both eyes 2 (two) times daily.    Past Week at Unknown time  . ramipril (ALTACE) 10 MG capsule Take 1 capsule (10 mg total) by mouth daily. 90 capsule 1 Past Week at Unknown time  . rosuvastatin (CRESTOR) 5 MG tablet Take 1 tablet (5 mg total) by mouth daily. as directed (Patient taking differently: Take 5 mg by mouth every Monday, Wednesday, and Friday. ) 90 tablet 3 Past Week at Unknown time  . triamterene-hydrochlorothiazide (MAXZIDE-25) 37.5-25 MG tablet Take 1 tablet by mouth daily.   Past Week at Unknown time  . vitamin C (ASCORBIC ACID) 500 MG  tablet Take 500 mg by mouth daily.    Past Week at Unknown time  . XIIDRA 5 % SOLN Apply 1 drop to eye daily.    Past Week at Unknown time  . furosemide (LASIX) 40 MG tablet Take 1 tablet (40 mg total) by mouth daily. (Patient not taking: Reported on 09/16/2019) 30 tablet 3 Not Taking at Unknown time  . HYDROcodone-acetaminophen (NORCO/VICODIN) 5-325 MG tablet Take 1 tablet by mouth every 4 (four) hours as needed. (Patient not taking: Reported on 09/16/2019) 15 tablet 0 Completed Course at Unknown time  . warfarin (COUMADIN) 5 MG tablet TAKE AS DIRECTED (Patient taking differently: Take 2.5 mg by mouth See admin instructions. Take 2.28m once daily except on Wednesdays.) 90 tablet 3 09/14/2019 at 1800    Assessment: 89YOM with h/o Afib on warfarin presented to the ED with weakness and poor oral itnake. FOund to have drop in hemoglobin with supratherapeutic INR 6.1. INR was reversed with one dose of oral vitamin K 5 mg.   INR up to 3.3 today despite conservative dosing. H/H stable. Plt wnl.  Home warfarin dose: 5 mg daily except 2.5 mg on Wednesday   Goal of Therapy:  INR 2-3 Monitor platelets by anticoagulation protocol: Yes   Plan:  -Will hold warfarin today to prevent further rise in INR  -Monitor daily PT/INR -Monitor closely for s/s of bleeding    BAlbertina Parr PharmD., BCPS Clinical Pharmacist Clinical phone for 09/19/19 until 5pm: [phone]

## 2019-09-19 NOTE — Progress Notes (Addendum)
PROGRESS NOTE  Dalton Vaughan  HQI:[id number] DOB: Jun 28, 1929 DOA: 09/15/2019 PCP: [name], DO  Outpatient Specialists: Orthopedics, Dr. Wynelle Link  Brief Narrative: Dalton Vaughan is an 85 y.o. male with a history of SSS, atrial fibrillation on coumadin, CAD, and hypothyroidism who presented from PCP's office to [facility] ED with weakness in the setting of poor per oral intake. He was found to have 1g drop in hgb, worsening supratherapeutic INR, and lower blood pressure at PCP's office. In the ED, he was hypoxic with ambulation with infiltrates on CT chest and positive covid-19 testing for which he was admitted to [facility] and started on remdesivir and decadron. He had been caring for his wife intensively for 6-7 months during a decline in her health which culminated in hospitalization and subsequent hospice placement due to end-stage multiple myeloma. While caretaking he experienced a meniscus tear complicated by hemarthrosis for which arthroscopy was performed earlier this month. Since that time, while he has been able to use the knee more, he has had progressive lack of appetite, depressed mood, change in taste. He was also found to have a supratherapeutic INR for which vitamin K was given.   Assessment & Plan: Principal Problem:   Pneumonia due to COVID-19 virus Active Problems:   Chronic atrial fibrillation (HCC)   GERD (gastroesophageal reflux disease)   Chronic anticoagulation   Benign prostatic hyperplasia   Hyperlipidemia   Depression   Hereditary and idiopathic peripheral neuropathy   Aortic atherosclerosis (HCC)   Acute respiratory failure with hypoxia (HCC)  Acute hypoxic respiratory failure due to covid-19 pneumonia:  - Weaned to room air 10/1. - Continue remdesivir 9/30 - 10/4. - Continue steroids 9/30 - 10/9. Decreased dose starting 10/3. - Continue airborne, contact precautions. PPE including surgical gown, gloves, cap, shoe covers, and CAPR used during this encounter in a negative  pressure room.  - Check daily labs: CBC w/diff, CMP, d-dimer, ferritin, CRP. Inflammatory markers continuing to decline. - Avoid NSAIDs - Recommend proning and aggressive use of incentive spirometry. - Goals of care were discussed  Chronic atrial fibrillation: with intermittent RVR improving with metoprolol. Telemetry personally reviewed this morning by myself showing afib and 2.32 second pause noted 10/2 at 1:16am. Will not increase dose of metoprolol with hx SSS/bradycardia, despite intermittent RVR with exertion.  - Continue coumadin with close outpatient following for INR checks. CHA2DS2-VASc score is 7.  - Continue low dose metoprolol (hold parameters for bradycardia or hypotension). Telemetry reviewed personally again today showing controlled ventricular response, no pauses. Overall HR control seems improved on exertion per PT. - Decrease dose of steroids since no wheezing, improving respiratory status and inflammatory markers and with suspicion that this may be contributing to tachycardia.  CAD s/p CABG 1999 by Dr. Servando Snare.  - No angina, hs-Tn wnl. No longer on beta blocker due to SSS/bradycardia presumably. - Continue MWF statin.  Supratherapeutic INR: Likely due to minimal per oral intake. Resolved s/p vitamin K 38m po x1 9/29.  - INR at goal 10/1, restarted lower dose coumadin and will continue to monitor INR daily. Due to recent hematoma and reported history of bleeding, will aim for lower end of goal 2-3 INR. Gave 1.580mdose 10/2. Hold dose today.  Constipation: Benign abd. LBM 5 days ago.  - Senna scheduled, schedule miralax, continue prn miralax 2nd dose and dulcolax suppository prn. Continue this.  Moderate protein-calorie malnutrition:  - Dietitian consulted.  - Continue remeron  Suprapatellar pouch hematoma: s/p debridement and right knee arthroscopy, MUA  on 08/28/2019 by Dr. Case. No evidence of recurrent hematoma, minimal effusion, no signs or symptoms of septic arthritis.   - PT/OT, OOB  - Follow up as scheduled with outpatient providers.  Hypothyroidism: TSH 2.850 - Continue synthroid.   Glaucoma:  - Continue home gtt's  Grief reaction: Appropriately processing the recent death of his wife.  - Continue remeron - Consider outpatient counseling.  LFT elevation: Mild - Monitor again in AM, ok to continue remdesivir.  DVT prophylaxis: SCDs, coumadin Code Status: Full Family Communication: None at bedside. Pt has declined offer to call son. Disposition Plan: Anticipate DC home after final dose of remdesivir on 10/4. Has assistance at home with multiple family members. CM consulted as home health would be of benefit to the patient. HHPT ordered.  Consultants:   None  Procedures:   None  Antimicrobials:  Remdesivir 9/30 - 10/4   Subjective: Feels he's regaining strength, eager to go home. No shortness of breath at rest, moderate on exertion and overall improving. Shortness of breath is not his usual baseline.    Objective: Vitals:   09/18/19 1628 09/18/19 2000 09/19/19 0400 09/19/19 0714  BP: 113/60 133/84 (!) 154/92 (!) 147/90  Pulse: 70  64 82  Resp: (!) 21  (!) 22 18  Temp: 98.2 F (36.8 C) 97.9 F (36.6 C) 97.9 F (36.6 C) 98 F (36.7 C)  TempSrc: Oral Oral Oral Oral  SpO2: 94%   97%  Weight:      Height:        Intake/Output Summary (Last 24 hours) at 09/19/2019 1018 Last data filed at 09/18/2019 1940 Gross per 24 hour  Intake 237 ml  Output -  Net 237 ml   Filed Weights   09/15/19 1336 09/15/19 2300  Weight: 74.4 kg 70 kg   Gen: Nontoxic elderly male in no distress Pulm: Nonlabored breathing room air. Clear. CV: Irreg irreg 70's bpm. No murmur, rub, or gallop. No JVD, no dependent edema. GI: Abdomen soft, non-tender, non-distended, with normoactive bowel sounds.  Ext: Warm, no deformities Skin: No rashes, lesions or ulcers on visualized skin. Neuro: Alert and oriented. No focal neurological deficits. Psych:  Judgement and insight appear fair. Mood euthymic & affect congruent. Behavior is appropriate.    Data Reviewed: I have personally reviewed following labs and imaging studies  CBC: Recent Labs  Lab 09/15/19 1417 09/16/19 0130 09/17/19 0252 09/18/19 0134 09/19/19 0200  WBC 8.0 7.3 13.0* 15.3* 14.3*  NEUTROABS  --  5.8 10.4* 12.6* 10.7*  HGB 12.5* 10.8* 11.9* 11.0* 11.3*  HCT 39.2 33.9* 37.1* 34.2* 35.2*  MCV 92.0 90.6 89.6 90.0 91.9  PLT 225 215 297 346 753   Basic Metabolic Panel: Recent Labs  Lab 09/15/19 1417 09/16/19 0130 09/17/19 0252 09/18/19 0134 09/19/19 0200  NA 135 138 136 136 138  K 3.6 3.6 4.1 4.6 5.0  CL 101 106 103 102 104  CO2 _0 GLUCOSE 121* 116* 139* 142* 149*  BUN _1 28* 31*  CREATININE 0.90 0.71 0.88 0.85 0.76  CALCIUM 9.2 8.7* 9.1 8.8* 8.7*   GFR: Estimated Creatinine Clearance: 63.2 mL/min (by C-G formula based on SCr of 0.76 mg/dL). Liver Function Tests: Recent Labs  Lab 09/16/19 0130 09/17/19 0252 09/18/19 0134 09/19/19 0200  AST 23 29 43* 64*  ALT 14 19 33 52*  ALKPHOS 113 119 111 112  BILITOT 0.7 0.5 0.3 0.7  PROT 6.1* 6.3* 6.0* 5.8*  ALBUMIN 2.8* 2.7*  2.7* 2.7*   No results for input(s): LIPASE, AMYLASE in the last 168 hours. No results for input(s): AMMONIA in the last 168 hours. Coagulation Profile: Recent Labs  Lab 09/15/19 1417 09/16/19 0130 09/17/19 0252 09/18/19 0134 09/19/19 0200  INR 6.1* 5.7* 2.4* 2.6* 3.3*   Cardiac Enzymes: No results for input(s): CKTOTAL, CKMB, CKMBINDEX, TROPONINI in the last 168 hours. BNP (last 3 results) Recent Labs    11/07/18 1615  PROBNP 2,681*   HbA1C: No results for input(s): HGBA1C in the last 72 hours. CBG: No results for input(s): GLUCAP in the last 168 hours. Lipid Profile: No results for input(s): CHOL, HDL, LDLCALC, TRIG, CHOLHDL, LDLDIRECT in the last 72 hours. Thyroid Function Tests: No results for input(s): TSH, T4TOTAL, FREET4, T3FREE, THYROIDAB in  the last 72 hours. Anemia Panel: No results for input(s): VITAMINB12, FOLATE, FERRITIN, TIBC, IRON, RETICCTPCT in the last 72 hours. Urine analysis:    Component Value Date/Time   COLORURINE YELLOW 07/31/2012 1452   APPEARANCEUR Clear 10/30/2018 1556   LABSPEC 1.009 07/31/2012 1452   PHURINE 7.0 07/31/2012 1452   GLUCOSEU Negative 10/30/2018 1556   HGBUR NEGATIVE 07/31/2012 1452   BILIRUBINUR Negative 10/30/2018 1556   KETONESUR 15 (A) 07/31/2012 1452   PROTEINUR Negative 10/30/2018 1556   PROTEINUR NEGATIVE 07/31/2012 1452   UROBILINOGEN negative 11/03/2013 1602   UROBILINOGEN 0.2 07/31/2012 1452   NITRITE Negative 10/30/2018 1556   NITRITE NEGATIVE 07/31/2012 1452   LEUKOCYTESUR Negative 10/30/2018 1556   Recent Results (from the past 240 hour(s))  SARS Coronavirus 2 [facility] order, Performed in [facility] hospital lab) Nasopharyngeal Nasopharyngeal Swab     Status: Abnormal   Collection Time: 09/15/19  4:59 PM   Specimen: Nasopharyngeal Swab  Result Value Ref Range Status   SARS Coronavirus 2 POSITIVE (A) NEGATIVE Final    Comment: RESULT CALLED TO, READ BACK BY AND VERIFIED WITH: TALBET,T AT 2040 ON 9.29.20 BY ISLEY,B (NOTE) If result is NEGATIVE SARS-CoV-2 target nucleic acids are NOT DETECTED. The SARS-CoV-2 RNA is generally detectable in upper and lower  respiratory specimens during the acute phase of infection. The lowest  concentration of SARS-CoV-2 viral copies this assay can detect is 250  copies / mL. A negative result does not preclude SARS-CoV-2 infection  and should not be used as the sole basis for treatment or other  patient management decisions.  A negative result may occur with  improper specimen collection / handling, submission of specimen other  than nasopharyngeal swab, presence of viral mutation(s) within the  areas targeted by this assay, and inadequate number of viral copies  (<250 copies / mL). A negative result must be combined with clinical   observations, patient history, and epidemiological information. If result is POSITIVE SARS-CoV-2 target nucleic acids are DETECTED.  The SARS-CoV-2 RNA is generally detectable in upper and lower  respiratory specimens during the acute phase of infection.  Positive  results are indicative of active infection with SARS-CoV-2.  Clinical  correlation with patient history and other diagnostic information is  necessary to determine patient infection status.  Positive results do  not rule out bacterial infection or co-infection with other viruses. If result is PRESUMPTIVE POSTIVE SARS-CoV-2 nucleic acids MAY BE PRESENT.   A presumptive positive result was obtained on the submitted specimen  and confirmed on repeat testing.  While 2019 novel coronavirus  (SARS-CoV-2) nucleic acids may be present in the submitted sample  additional confirmatory testing may be necessary for epidemiological  and / or  clinical management purposes  to differentiate between  SARS-CoV-2 and other Sarbecovirus currently known to infect humans.  If clinically indicated additional testing with an alternate test  methodology [phone])  is advised. The SARS-CoV-2 RNA is generally  detectable in upper and lower respiratory specimens during the acute  phase of infection. The expected result is Negative. Fact Sheet for Patients:  StrictlyIdeas.no Fact Sheet for Healthcare Providers: BankingDealers.co.za This test is not yet approved or cleared by the [place] FDA and has been authorized for detection and/or diagnosis of SARS-CoV-2 by FDA under an Emergency Use Authorization (EUA).  This EUA will remain in effect (meaning this test can be used) for the duration of the COVID-19 declaration under Section 564(b)(1) of the Act, 21 U.S.C. section 360bbb-3(b)(1), unless the authorization is terminated or revoked sooner. Performed at [facility], [address],  [place], [place] [address]       Radiology Studies: No results found.  Scheduled Meds: . dexamethasone  2 mg Oral Daily  . doxazosin  8 mg Oral QHS  . feeding supplement (ENSURE ENLIVE)  237 mL Oral TID BM  . gabapentin  100 mg Oral QHS  . levothyroxine  50 mcg Oral QAC breakfast  . Lifitegrast  1 drop Ophthalmic Daily  . metoprolol tartrate  12.5 mg Oral BID  . mirtazapine  15 mg Oral QHS  . olopatadine  1 drop Both Eyes BID  . polyethylene glycol  17 g Oral Daily  . rosuvastatin  5 mg Oral Q M,W,F  . senna-docusate  1 tablet Oral Daily  . Warfarin - Pharmacist Dosing Inpatient   Does not apply q1800   Continuous Infusions: . remdesivir 100 mg in NS 250 mL 100 mg (09/18/19 0944)     LOS: 4 days   Time spent: 35 minutes.  Patrecia Pour, MD [facility] [email or web address] Password [email or web address] 09/19/2019, 10:18 AM

## 2019-09-19 NOTE — Progress Notes (Signed)
Called and updated patient's son and answered any questions.

## 2019-09-20 DIAGNOSIS — I482 Chronic atrial fibrillation, unspecified: Secondary | ICD-10-CM

## 2019-09-20 DIAGNOSIS — R351 Nocturia: Secondary | ICD-10-CM

## 2019-09-20 DIAGNOSIS — F321 Major depressive disorder, single episode, moderate: Secondary | ICD-10-CM

## 2019-09-20 DIAGNOSIS — I700 Atherosclerosis of aorta: Secondary | ICD-10-CM

## 2019-09-20 DIAGNOSIS — J9601 Acute respiratory failure with hypoxia: Secondary | ICD-10-CM

## 2019-09-20 DIAGNOSIS — Z7901 Long term (current) use of anticoagulants: Secondary | ICD-10-CM

## 2019-09-20 DIAGNOSIS — E782 Mixed hyperlipidemia: Secondary | ICD-10-CM

## 2019-09-20 DIAGNOSIS — N401 Enlarged prostate with lower urinary tract symptoms: Secondary | ICD-10-CM

## 2019-09-20 DIAGNOSIS — K219 Gastro-esophageal reflux disease without esophagitis: Secondary | ICD-10-CM

## 2019-09-20 LAB — COMPREHENSIVE METABOLIC PANEL
ALT: 57 U/L — ABNORMAL HIGH (ref 0–44)
AST: 56 U/L — ABNORMAL HIGH (ref 15–41)
Albumin: 2.5 g/dL — ABNORMAL LOW (ref 3.5–5.0)
Alkaline Phosphatase: 93 U/L (ref 38–126)
Anion gap: 5 (ref 5–15)
BUN: 27 mg/dL — ABNORMAL HIGH (ref 8–23)
CO2: 27 mmol/L (ref 22–32)
Calcium: 8.6 mg/dL — ABNORMAL LOW (ref 8.9–10.3)
Chloride: 107 mmol/L (ref 98–111)
Creatinine, Ser: 0.78 mg/dL (ref 0.61–1.24)
GFR calc Af Amer: >60 mL/min (ref >60)
GFR calc non Af Amer: >60 mL/min (ref >60)
Glucose, Bld: 109 mg/dL — ABNORMAL HIGH (ref 70–99)
Potassium: 4.8 mmol/L (ref 3.5–5.1)
Sodium: 139 mmol/L (ref 135–145)
Total Bilirubin: 0.2 mg/dL — ABNORMAL LOW (ref 0.3–1.2)
Total Protein: 5.3 g/dL — ABNORMAL LOW (ref 6.5–8.1)

## 2019-09-20 LAB — CBC WITH DIFFERENTIAL/PLATELET
Abs Immature Granulocytes: 0.7 10*3/uL — ABNORMAL HIGH (ref 0.00–0.07)
Basophils Absolute: 0.1 10*3/uL (ref 0.0–0.1)
Basophils Relative: 1 %
Eosinophils Absolute: 0 10*3/uL (ref 0.0–0.5)
Eosinophils Relative: 0 %
HCT: 32.8 % — ABNORMAL LOW (ref 39.0–52.0)
Hemoglobin: 10.5 g/dL — ABNORMAL LOW (ref 13.0–17.0)
Immature Granulocytes: 6 %
Lymphocytes Relative: 19 %
Lymphs Abs: 2.3 10*3/uL (ref 0.7–4.0)
MCH: 28.9 pg (ref 26.0–34.0)
MCHC: 32 g/dL (ref 30.0–36.0)
MCV: 90.4 fL (ref 80.0–100.0)
Monocytes Absolute: 1 10*3/uL (ref 0.1–1.0)
Monocytes Relative: 8 %
Neutro Abs: 8.4 10*3/uL — ABNORMAL HIGH (ref 1.7–7.7)
Neutrophils Relative %: 66 %
Platelets: 389 10*3/uL (ref 150–400)
RBC: 3.63 MIL/uL — ABNORMAL LOW (ref 4.22–5.81)
RDW: 14.8 % (ref 11.5–15.5)
WBC: 12.5 10*3/uL — ABNORMAL HIGH (ref 4.0–10.5)
nRBC: 0 % (ref 0.0–0.2)

## 2019-09-20 LAB — PROTIME-INR
INR: 3.2 — ABNORMAL HIGH (ref 0.8–1.2)
Prothrombin Time: 31.9 seconds — ABNORMAL HIGH (ref 11.4–15.2)

## 2019-09-20 LAB — C-REACTIVE PROTEIN: CRP: 1.1 mg/dL — ABNORMAL HIGH (ref <1.0)

## 2019-09-20 MED ORDER — WARFARIN SODIUM 5 MG PO TABS: ORAL_TABLET | ORAL | 3 refills | Status: DC

## 2019-09-20 MED ORDER — SODIUM CHLORIDE 0.9 % IV SOLN
100.0000 mg | INTRAVENOUS | Status: AC
  Administered 2019-09-20: 12:00:00 100 mg via INTRAVENOUS
  Filled 2019-09-20: qty 20

## 2019-09-20 NOTE — Plan of Care (Addendum)
Mr. Carothers remains in the PCU and is resting comfortably in bed.  No acute events overnight.  See flowsheet for assessment details and MAR for medication administration.  Will continue to monitor.     This RN assumed are of this patient at 2300.  Problem: Education: Goal: Knowledge of General Education information will improve Description: Including pain rating scale, medication(s)/side effects and non-pharmacologic comfort measures Outcome: Progressing   Problem: Health Behavior/Discharge Planning: Goal: Ability to manage health-related needs will improve Outcome: Progressing   Problem: Clinical Measurements: Goal: Ability to maintain clinical measurements within normal limits will improve Outcome: Progressing Goal: Will remain free from infection Outcome: Progressing Goal: Diagnostic test results will improve Outcome: Progressing Goal: Respiratory complications will improve Outcome: Progressing Goal: Cardiovascular complication will be avoided Outcome: Progressing   Problem: Activity: Goal: Risk for activity intolerance will decrease Outcome: Progressing   Problem: Nutrition: Goal: Adequate nutrition will be maintained Outcome: Progressing   Problem: Coping: Goal: Level of anxiety will decrease Outcome: Progressing   Problem: Elimination: Goal: Will not experience complications related to bowel motility Outcome: Progressing Goal: Will not experience complications related to urinary retention Outcome: Progressing   Problem: Pain Managment: Goal: General experience of comfort will improve Outcome: Progressing   Problem: Safety: Goal: Ability to remain free from injury will improve Outcome: Progressing   Problem: Skin Integrity: Goal: Risk for impaired skin integrity will decrease Outcome: Progressing

## 2019-09-20 NOTE — TOC Transition Note (Signed)
Transition of Care [facility]) - CM/SW Discharge Note Marvetta Gibbons RN,BSN Transitions of Care [place] remote coverage - RN Case Manager [phone]   Patient Details  Name: Camran Keady MRN: [id number] Date of Birth: 11/05/1929  [facility] CM/SW Contact:  Dawayne Patricia, RN Phone Number: 09/20/2019, 8:33 AM   Clinical Narrative:    Pt stable for transition home today. HHPT has been arranged with [facility] by previous CM.    Final next level of care: Home w Home Health Services Barriers to Discharge: Barriers Resolved, No Barriers Identified   Patient Goals and CMS Choice   CMS Medicare.gov Compare Post Acute Care list provided to:: Patient(patient knew which agency he wanted) Choice offered to / list presented to : Patient(Patient wanted [facility]  Discharge Placement        Home with [facility]               Discharge Plan and Services   Discharge Planning Services: CM Consult Post Acute Care Choice: Home Health, Durable Medical Equipment          DME Arranged: N/A         HH Arranged: PT HH Agency: [place] ([place]) Date HH Agency Contacted: 09/18/19 Time HH Agency Contacted: [place] ([facility]) Interventions     Readmission Risk Interventions Readmission Risk Prevention Plan 09/20/2019  Transportation Screening Complete  PCP or Specialist Appt within 5-7 Days Complete  Home Care Screening Complete  Medication Review (RN CM) Complete  Some recent data might be hidden

## 2019-09-20 NOTE — Progress Notes (Addendum)
1219: Call to son Gianny. No answer at this time. VM left with C/B.  1400: Spoke with son Real. Will be up to pick up patient in about an hr. [place] focus.  1455: Patient discharged home. Discharge instructions provided. Personal items given to patient.

## 2019-09-20 NOTE — Discharge Summary (Signed)
Physician Discharge Summary  Dalton Vaughan ZLD:[id number] DOB: [birth year] DOA: 09/15/2019  PCP: Dalton Norlander, DO  Admit date: 09/15/2019 Discharge date: 09/20/2019  Admitted From: Home Disposition: Home   Recommendations for Outpatient Follow-up:  1. Follow up with PCP within the next week 2. Please obtain CMP, CBC, and PT-INR in one week to inform decisions regarding coumadin dosing. Due to supratherapeutic INR and recent suprapatellar hematoma, coumadin dose was decreased from 2.36m daily (except Wednesday) to 1.267mdaily (except Wednesday) 3. Strongly recommend confirming good social supports in setting of wife's recent passing. Consider outpatient counseling, though patient's coping skills appear to be sufficient at this time.  Home Health: PT Equipment/Devices: None new Discharge Condition: Stable, improved CODE STATUS: Full Diet recommendation: Heart healthy  Brief/Interim Summary: RaStephone Vaughan an [age].o. male with a history of SSS, atrial fibrillation on coumadin, CAD, and hypothyroidism who [facility] weakness in the setting of poor per oral intake. He was found to have 1g drop in hgb, worsening supratherapeutic INR, and lower blood pressure at PCP's office. In the ED, he was hypoxic with ambulation with infiltrates on CT chest and positive covid-19 testing [facility] and decadron. He had been caring for his wife intensively for 6-7 months during a decline in her health which culminated in hospitalization and subsequent hospice placement due to end-stage multiple myeloma.   With treatment, supplemental oxygen was weaned, and he is discharged in stable condition having completed 5 days of remdesivir and 6 doses of steroids. No further steroids are prescribed due to clinical resolution of respiratory symptoms and concern that these may have caused increased exertional heart rate. Vitamin K was given, INR  declined to therapeutic range and has since risen despite lowering dose of coumadin. There has been no bleeding. He will receive home health services and be assisted 24 hours/day by his son.   Discharge Diagnoses:  Principal Problem:   Pneumonia due to COVID-19 virus Active Problems:   Chronic atrial fibrillation (HCC)   GERD (gastroesophageal reflux disease)   Chronic anticoagulation   Benign prostatic hyperplasia   Hyperlipidemia   Depression   Hereditary and idiopathic peripheral neuropathy   Aortic atherosclerosis (HCC)   Acute respiratory failure with hypoxia (HCC)  Acute hypoxic respiratory failure due to covid-19 pneumonia:  - Weaned to room air 10/1. CRP trended downward 7.9 >> 1.1. Can pull full 2.5L on incentive spirometry. Respiratory symptoms essentially resolved at time of discharge.  - Completed remdesivir 9/30 - 10/4. - Completed steroids 9/28 - 10/4. Decreased dose starting 10/3. - Continue isolation per CDC guidelines.  - Avoid NSAIDs  Chronic atrial fibrillation: with intermittent RVR improving with metoprolol. Stopping steroids and HR has improved generally with convalescence from covid. Will not continue on metoprolol with history of SSS and 2.32second pause on telemetry while admitted. - Continue coumadin with close outpatient following for INR checks. CHA2DS2-VASc scoreis 7.   CAD s/p CABG 1999 by Dr. GeServando Snare - No angina, hs-Tn wnl. No longer on beta blocker due to SSS/bradycardia presumably. - Continue MWF statin.  Supratherapeutic INR: Likely due to minimal per oral intake. Resolved s/p vitamin K 53m74mo x1 on 9/29.  - INR at goal 10/1, restarted lower dose coumadin with rise 2.6 > 3.3 then down to 3.2 with holding dose. Recommend 1/2 home dosing until PCP follow up.   Constipation: Benign abd. Resolved w/BM 10/3. - Advised to  continue with regular regimen.  Moderate protein-calorie malnutrition:  - Dietitian consulted.  - Continue  remeron  Suprapatellar pouch hematoma: s/p debridement and right knee arthroscopy, MUA on 08/28/2019 by Dr. Case. No evidence of recurrent hematoma, minimal effusion, no signs or symptoms of septic arthritis.  - PT to continue at home. - Follow up as scheduled with outpatient providers.  Hypothyroidism: TSH 2.850 - Continue synthroid.   Glaucoma:  - Continue home gtt's  Grief reaction: Appropriately processing the recent death of his wife.  - Continue remeron - Consider outpatient counseling.  LFT elevation: Mild, stable, consistent with viral infection and/or remdesivir therapy..  - Check at follow up.   Discharge Instructions Discharge Instructions    Diet - low sodium heart healthy   Complete by: As directed    Discharge instructions   Complete by: As directed    You are being discharged from the hospital after treatment for covid-19 infection. You are felt to be stable enough to no longer require inpatient monitoring, testing, and treatment, though you will need to follow the recommendations below: - Continue taking coumadin, but decrease dose by half. No further treatment for covid specifically is required.  - You will need labs rechecked sometime this week if possible, contact your PCP to arrange. - If you notice a heart rate above 120bpm consistently, you will need to contact your PCP or seek medical attention. - Remain in self-isolation for 14 days following discharge to reduce risk of transmission of the virus.  - Do not take NSAID medications (including, but not limited to, ibuprofen, advil, motrin, naproxen, aleve, goody's powder, etc.) - Follow up with your doctor in the next week via telehealth or seek medical attention right away if your symptoms get WORSE.  - Consider donating plasma after you have recovered (either 14 days after a negative test or 28 days after symptoms have completely resolved) because your antibodies to this virus may be helpful to give to others  with life-threatening infections. Please go to the website www.[email or web address] if you would like to consider volunteering for plasma donation.    Directions for you at home:  Wear a facemask You should wear a facemask that covers your nose and mouth when you are in the same room with other people and when you visit a healthcare provider. People who live with or visit you should also wear a facemask while they are in the same room with you.  Separate yourself from other people in your home As much as possible, you should stay in a different room from other people in your home. Also, you should use a separate bathroom, if available.  Avoid sharing household items You should not share dishes, drinking glasses, cups, eating utensils, towels, bedding, or other items with other people in your home. After using these items, you should wash them thoroughly with soap and water.  Cover your coughs and sneezes Cover your mouth and nose with a tissue when you cough or sneeze, or you can cough or sneeze into your sleeve. Throw used tissues in a lined trash can, and immediately wash your hands with soap and water for at least 20 seconds or use an alcohol-based hand rub.  Wash your [facility] your hands often and thoroughly with soap and water for at least 20 seconds. You can use an alcohol-based hand sanitizer if soap and water are not available and if your hands are not visibly dirty. Avoid touching your eyes, nose, and mouth with unwashed hands.  Directions for those who live with, or provide care at home for you:  Limit the number of people who have contact with the patient If possible, have only one caregiver for the patient. Other household members should stay in another home or place of residence. If this is not possible, they should stay in another room, or be separated from the patient as much as possible. Use a separate bathroom, if available. Restrict visitors who do not have an essential  need to be in the home.  Ensure good ventilation Make sure that shared spaces in the home have good air flow, such as from an air conditioner or an opened window, weather permitting.  Wash your hands often Wash your hands often and thoroughly with soap and water for at least 20 seconds. You can use an alcohol based hand sanitizer if soap and water are not available and if your hands are not visibly dirty. Avoid touching your eyes, nose, and mouth with unwashed hands. Use disposable paper towels to dry your hands. If not available, use dedicated cloth towels and replace them when they become wet.  Wear a facemask and gloves Wear a disposable facemask at all times in the room and gloves when you touch or have contact with the patient's blood, body fluids, and/or secretions or excretions, such as sweat, saliva, sputum, nasal mucus, vomit, urine, or feces.  Ensure the mask fits over your nose and mouth tightly, and do not touch it during use. Throw out disposable facemasks and gloves after using them. Do not reuse. Wash your hands immediately after removing your facemask and gloves. If your personal clothing becomes contaminated, carefully remove clothing and launder. Wash your hands after handling contaminated clothing. Place all used disposable facemasks, gloves, and other waste in a lined container before disposing them with other household waste. Remove gloves and wash your hands immediately after handling these items.  Do not share dishes, glasses, or other household items with the patient Avoid sharing household items. You should not share dishes, drinking glasses, cups, eating utensils, towels, bedding, or other items with a patient who is confirmed to have, or being evaluated for, COVID-19 infection. After the person uses these items, you should wash them thoroughly with soap and water.  Wash laundry thoroughly Immediately remove and wash clothes or bedding that have blood, body fluids,  and/or secretions or excretions, such as sweat, saliva, sputum, nasal mucus, vomit, urine, or feces, on them. Wear gloves when handling laundry from the patient. Read and follow directions on labels of laundry or clothing items and detergent. In general, wash and dry with the warmest temperatures recommended on the label.  Clean all areas the individual has used often Clean all touchable surfaces, such as counters, tabletops, doorknobs, bathroom fixtures, toilets, phones, keyboards, tablets, and bedside tables, every day. Also, clean any surfaces that may have blood, body fluids, and/or secretions or excretions on them. Wear gloves when cleaning surfaces the patient has come in contact with. Use a diluted bleach solution (e.g., dilute bleach with 1 part bleach and 10 parts water) or a household disinfectant with a label that says EPA-registered for coronaviruses. To make a bleach solution at home, add 1 tablespoon of bleach to 1 quart (4 cups) of water. For a larger supply, add  cup of bleach to 1 gallon (16 cups) of water. Read labels of cleaning products and follow recommendations provided on product labels. Labels contain instructions for safe and effective use of the cleaning product including precautions  you should take when applying the product, such as wearing gloves or eye protection and making sure you have good ventilation during use of the product. Remove gloves and wash hands immediately after cleaning.  Monitor yourself for signs and symptoms of illness Caregivers and household members are considered close contacts, should monitor their health, and will be asked to limit movement outside of the home to the extent possible. Follow the monitoring steps for close contacts listed on the symptom monitoring form.  If you have additional questions, contact your local health department or call the epidemiologist on call at [phone] (available 24/7). This guidance is subject to change. For  the most up-to-date guidance from [facility], please refer to their website: YouBlogs.pl   Increase activity slowly   Complete by: As directed    MyChart COVID-19 home monitoring program   Complete by: Sep 20, 2019    Is the patient willing to use the [facility] for home monitoring?: Yes   Temperature monitoring   Complete by: Sep 20, 2019    After how many days would you like to receive a notification of this patient's flowsheet entries?: 1     Allergies as of 09/20/2019      Reactions   Paxil [paroxetine Hcl] Palpitations   Eliquis [apixaban] Other (See Comments)   dizziness   Penicillins Other (See Comments)   Did it involve swelling of the face/tongue/throat, SOB, or low BP? Unknown Did it involve sudden or severe rash/hives, skin peeling, or any reaction on the inside of your mouth or nose? Unknown Did you need to seek medical attention at a hospital or doctor's office? Unknown When did it last happen?unk If all above answers are "NO", may proceed with cephalosporin use.   Pravachol [pravastatin Sodium] Other (See Comments)   myalgias   Zetia [ezetimibe] Other (See Comments)   myalgias   Vytorin [ezetimibe-simvastatin] Other (See Comments)   myalgias      Medication List    STOP taking these medications   HYDROcodone-acetaminophen 5-325 MG tablet Commonly known as: NORCO/VICODIN     TAKE these medications   amLODipine 5 MG tablet Commonly known as: NORVASC Take 1 tablet (5 mg total) by mouth daily. What changed:   how much to take  when to take this   Calcium + D3 600-200 MG-UNIT Tabs Take 1 tablet by mouth daily.   doxazosin 8 MG tablet Commonly known as: CARDURA Take 1 tablet (8 mg total) by mouth at bedtime.   furosemide 40 MG tablet Commonly known as: LASIX Take 1 tablet (40 mg total) by mouth daily.   gabapentin 100 MG capsule Commonly known as: NEURONTIN Take 1 capsule (100 mg  total) by mouth at bedtime.   levothyroxine 50 MCG tablet Commonly known as: SYNTHROID TAKE 1 TABLET DAILY What changed: when to take this   mirtazapine 15 MG tablet Commonly known as: Remeron Take 1 tablet (15 mg total) by mouth at bedtime.   multivitamin tablet Take 1 tablet by mouth daily.   olopatadine 0.1 % ophthalmic solution Commonly known as: PATANOL Place 1 drop into both eyes 2 (two) times daily.   ramipril 10 MG capsule Commonly known as: Altace Take 1 capsule (10 mg total) by mouth daily.   rosuvastatin 5 MG tablet Commonly known as: CRESTOR Take 1 tablet (5 mg total) by mouth daily. as directed What changed:   when to take this  additional instructions   triamterene-hydrochlorothiazide 37.5-25 MG tablet Commonly known as: MAXZIDE-25 Take  1 tablet by mouth daily.   vitamin C 500 MG tablet Commonly known as: ASCORBIC ACID Take 500 mg by mouth daily.   Vitamin D-3 125 MCG (5000 UT) Tabs Take 1 tablet by mouth daily.   warfarin 5 MG tablet Commonly known as: COUMADIN Take as directed. If you are unsure how to take this medication, talk to your nurse or doctor. Original instructions: TAKE AS DIRECTED What changed:   how much to take  how to take this  when to take this  additional instructions   Xiidra 5 % Soln Generic drug: Lifitegrast Apply 1 drop to eye daily.      Follow-up [place], [facility] Follow up.   Why: A representative from [facility] will contact you to arrange start date and time for your therapy. Contact information: [place] [address] [place], [place], [place]. Schedule an appointment as soon as possible for a visit.   Specialty: Family Medicine Contact information: [address] [place] [address] [phone]          Allergies  Allergen Reactions  . Paxil [Paroxetine Hcl] Palpitations  . Eliquis  [Apixaban] Other (See Comments)    dizziness  . Penicillins Other (See Comments)    Did it involve swelling of the face/tongue/throat, SOB, or low BP? Unknown Did it involve sudden or severe rash/hives, skin peeling, or any reaction on the inside of your mouth or nose? Unknown Did you need to seek medical attention at a hospital or doctor's office? Unknown When did it last happen?unk If all above answers are "NO", may proceed with cephalosporin use.   Christy Sartorius Sodium] Other (See Comments)    myalgias  . Zetia [Ezetimibe] Other (See Comments)    myalgias  . Vytorin [Ezetimibe-Simvastatin] Other (See Comments)    myalgias    Consultations:  None  Procedures/Studies: Dg Chest 2 View  Result Date: 09/15/2019 CLINICAL DATA:  85 year old presenting with acute onset of cough. EXAM: CHEST - 2 VIEW COMPARISON:  12/01/2018 and earlier. FINDINGS: AP ERECT and LATERAL images were obtained. Prior sternotomy for CABG. Cardiac silhouette mildly to moderately enlarged, unchanged. Thoracic aorta tortuous and atherosclerotic, unchanged. Hilar and mediastinal contours otherwise unremarkable. Mild linear atelectasis in the RIGHT MIDDLE LOBE. Lungs otherwise clear. Pulmonary vascularity normal. Bronchovascular markings normal. No pleural effusions. Degenerative changes involving the thoracic and visualized lumbar spine. IMPRESSION: 1. Mild linear atelectasis in the RIGHT MIDDLE LOBE. No acute cardiopulmonary disease otherwise. 2. Stable cardiomegaly without pulmonary edema. Electronically Signed   By: Evangeline Dakin M.D.   On: 09/15/2019 14:52   Ct Chest W Contrast  Result Date: 09/15/2019 CLINICAL DATA:  Cough for several weeks with shortness of Breath EXAM: CT CHEST WITH CONTRAST TECHNIQUE: Multidetector CT imaging of the chest was performed during intravenous contrast administration. CONTRAST:  59m OMNIPAQUE IOHEXOL 300 MG/ML  SOLN COMPARISON:  Chest x-Jeromy from earlier in the same  day, CT of the abdomen and pelvis from 08/08/2007 and CT of the chest from 02/26/2011. FINDINGS: Cardiovascular: Atherosclerotic changes of the thoracic aorta are noted. No aneurysmal dilatation is seen. Mild cardiomegaly is noted. Postsurgical changes consistent with coronary bypass grafting are noted. Native coronary calcifications are seen. Mediastinum/Nodes: Thoracic inlet is within normal limits. No sizable hilar or mediastinal adenopathy is noted. The esophagus is within normal limits. Lungs/Pleura: Lungs are well aerated bilaterally. Some scattered  peripheral ground-glass opacities are identified within both lungs. No sizable effusion or pneumothorax is seen. No sizable nodules are noted. Upper Abdomen: Visualized upper abdomen is within normal limits. Stable dilatation of the collecting system is noted when compared with 2008. Musculoskeletal: Degenerative changes of the thoracic spine are noted. IMPRESSION: Patchy ground-glass infiltrates identified bilaterally without associated effusion. This could represent an atypical pneumonia although the possibility of COVID-19 deserves consideration. Correlation with testing is recommended. No other acute abnormality is noted. Aortic Atherosclerosis (ICD10-I70.0). Electronically Signed   By: Inez Catalina M.D.   On: 09/15/2019 16:38    Subjective: Feels well, no respiratory complaints. Wants to go home. No bleeding  Discharge Exam: Vitals:   09/20/19 0747 09/20/19 0855  BP: (!) 131/93   Pulse: 62 94  Resp: 17   Temp: 97.8 F (36.6 C)   SpO2: 95%    General: Well-appearing, pleasant. Cardiovascular: Irreg irreg, rate 60-70's. No m/r/g. No JVD or edema. Respiratory: Nonlabored and clear  Labs: Basic Metabolic Panel: Recent Labs  Lab 09/16/19 0130 09/17/19 0252 09/18/19 0134 09/19/19 0200 09/20/19 0150  NA 138 136 136 138 139  K 3.6 4.1 4.6 5.0 4.8  CL 106 103 102 104 107  CO2 _0 GLUCOSE 116* 139* 142* 149* 109*  BUN 15 21  28* 31* 27*  CREATININE 0.71 0.88 0.85 0.76 0.78  CALCIUM 8.7* 9.1 8.8* 8.7* 8.6*   Liver Function Tests: Recent Labs  Lab 09/16/19 0130 09/17/19 0252 09/18/19 0134 09/19/19 0200 09/20/19 0150  AST 23 29 43* 64* 56*  ALT 14 19 33 52* 57*  ALKPHOS 113 119 111 112 93  BILITOT 0.7 0.5 0.3 0.7 0.2*  PROT 6.1* 6.3* 6.0* 5.8* 5.3*  ALBUMIN 2.8* 2.7* 2.7* 2.7* 2.5*   CBC: Recent Labs  Lab 09/16/19 0130 09/17/19 0252 09/18/19 0134 09/19/19 0200 09/20/19 0150  WBC 7.3 13.0* 15.3* 14.3* 12.5*  NEUTROABS 5.8 10.4* 12.6* 10.7* 8.4*  HGB 10.8* 11.9* 11.0* 11.3* 10.5*  HCT 33.9* 37.1* 34.2* 35.2* 32.8*  MCV 90.6 89.6 90.0 91.9 90.4  PLT 215 297 346 380 389   Microbiology Recent Results (from the past 240 hour(s))  SARS Coronavirus 2 [facility] order, Performed in [facility] hospital lab) Nasopharyngeal Nasopharyngeal Swab     Status: Abnormal   Collection Time: 09/15/19  4:59 PM   Specimen: Nasopharyngeal Swab  Result Value Ref Range Status   SARS Coronavirus 2 POSITIVE (A) NEGATIVE Final    Comment: RESULT CALLED TO, READ BACK BY AND VERIFIED WITH: TALBET,T AT 2040 ON 9.29.20 BY ISLEY,B (NOTE) If result is NEGATIVE SARS-CoV-2 target nucleic acids are NOT DETECTED. The SARS-CoV-2 RNA is generally detectable in upper and lower  respiratory specimens during the acute phase of infection. The lowest  concentration of SARS-CoV-2 viral copies this assay can detect is 250  copies / mL. A negative result does not preclude SARS-CoV-2 infection  and should not be used as the sole basis for treatment or other  patient management decisions.  A negative result may occur with  improper specimen collection / handling, submission of specimen other  than nasopharyngeal swab, presence of viral mutation(s) within the  areas targeted by this assay, and inadequate number of viral copies  (<250 copies / mL). A negative result must be combined with clinical  observations, patient history, and  epidemiological information. If result is POSITIVE SARS-CoV-2 target nucleic acids are DETECTED.  The SARS-CoV-2 RNA is generally detectable in upper and lower  respiratory specimens during the acute phase of infection.  Positive  results are indicative of active infection with SARS-CoV-2.  Clinical  correlation with patient history and other diagnostic information is  necessary to determine patient infection status.  Positive results do  not rule out bacterial infection or co-infection with other viruses. If result is PRESUMPTIVE POSTIVE SARS-CoV-2 nucleic acids MAY BE PRESENT.   A presumptive positive result was obtained on the submitted specimen  and confirmed on repeat testing.  While 2019 novel coronavirus  (SARS-CoV-2) nucleic acids may be present in the submitted sample  additional confirmatory testing may be necessary for epidemiological  and / or clinical management purposes  to differentiate between  SARS-CoV-2 and other Sarbecovirus currently known to infect humans.  If clinically indicated additional testing with an alternate test  methodology [id number])  is advised. The SARS-CoV-2 RNA is generally  detectable in upper and lower respiratory specimens during the acute  phase of infection. The expected result is Negative. Fact Sheet for Patients:  StrictlyIdeas.no Fact Sheet for Healthcare Providers: BankingDealers.co.za This test is not yet approved or cleared by the [place] FDA and has been authorized for detection and/or diagnosis of SARS-CoV-2 by FDA under an Emergency Use Authorization (EUA).  This EUA will remain in effect (meaning this test can be used) for the duration of the COVID-19 declaration under Section 564(b)(1) of the Act, 21 U.S.C. section 360bbb-3(b)(1), unless the authorization is terminated or revoked sooner. Performed at [facility], [address], [place], [place] [address]     Time coordinating  discharge: Approximately 40 minutes  Patrecia Pour, MD  [facility] 09/20/2019, 9:28 AM

## 2019-09-22 ENCOUNTER — Ambulatory Visit (INDEPENDENT_AMBULATORY_CARE_PROVIDER_SITE_OTHER): Payer: Medicare Other | Admitting: Family Medicine

## 2019-09-22 ENCOUNTER — Encounter: Payer: Self-pay | Admitting: Family Medicine

## 2019-09-22 DIAGNOSIS — Z87891 Personal history of nicotine dependence: Secondary | ICD-10-CM | POA: Diagnosis not present

## 2019-09-22 DIAGNOSIS — I509 Heart failure, unspecified: Secondary | ICD-10-CM | POA: Diagnosis not present

## 2019-09-22 DIAGNOSIS — H409 Unspecified glaucoma: Secondary | ICD-10-CM | POA: Diagnosis not present

## 2019-09-22 DIAGNOSIS — Z7901 Long term (current) use of anticoagulants: Secondary | ICD-10-CM

## 2019-09-22 DIAGNOSIS — E440 Moderate protein-calorie malnutrition: Secondary | ICD-10-CM | POA: Diagnosis not present

## 2019-09-22 DIAGNOSIS — U071 COVID-19: Secondary | ICD-10-CM | POA: Diagnosis not present

## 2019-09-22 DIAGNOSIS — F4320 Adjustment disorder, unspecified: Secondary | ICD-10-CM | POA: Diagnosis not present

## 2019-09-22 DIAGNOSIS — M5136 Other intervertebral disc degeneration, lumbar region: Secondary | ICD-10-CM | POA: Diagnosis not present

## 2019-09-22 DIAGNOSIS — Z8673 Personal history of transient ischemic attack (TIA), and cerebral infarction without residual deficits: Secondary | ICD-10-CM | POA: Diagnosis not present

## 2019-09-22 DIAGNOSIS — J1282 Pneumonia due to coronavirus disease 2019: Secondary | ICD-10-CM

## 2019-09-22 DIAGNOSIS — K5790 Diverticulosis of intestine, part unspecified, without perforation or abscess without bleeding: Secondary | ICD-10-CM | POA: Diagnosis not present

## 2019-09-22 DIAGNOSIS — I110 Hypertensive heart disease with heart failure: Secondary | ICD-10-CM | POA: Diagnosis not present

## 2019-09-22 DIAGNOSIS — I495 Sick sinus syndrome: Secondary | ICD-10-CM | POA: Diagnosis not present

## 2019-09-22 DIAGNOSIS — E559 Vitamin D deficiency, unspecified: Secondary | ICD-10-CM | POA: Diagnosis not present

## 2019-09-22 DIAGNOSIS — Z9889 Other specified postprocedural states: Secondary | ICD-10-CM | POA: Diagnosis not present

## 2019-09-22 DIAGNOSIS — N400 Enlarged prostate without lower urinary tract symptoms: Secondary | ICD-10-CM | POA: Diagnosis not present

## 2019-09-22 DIAGNOSIS — K219 Gastro-esophageal reflux disease without esophagitis: Secondary | ICD-10-CM | POA: Diagnosis not present

## 2019-09-22 DIAGNOSIS — I700 Atherosclerosis of aorta: Secondary | ICD-10-CM | POA: Diagnosis not present

## 2019-09-22 DIAGNOSIS — I482 Chronic atrial fibrillation, unspecified: Secondary | ICD-10-CM

## 2019-09-22 DIAGNOSIS — J1289 Other viral pneumonia: Secondary | ICD-10-CM

## 2019-09-22 DIAGNOSIS — Z5181 Encounter for therapeutic drug level monitoring: Secondary | ICD-10-CM | POA: Diagnosis not present

## 2019-09-22 DIAGNOSIS — I2510 Atherosclerotic heart disease of native coronary artery without angina pectoris: Secondary | ICD-10-CM | POA: Diagnosis not present

## 2019-09-22 DIAGNOSIS — E785 Hyperlipidemia, unspecified: Secondary | ICD-10-CM | POA: Diagnosis not present

## 2019-09-22 DIAGNOSIS — G609 Hereditary and idiopathic neuropathy, unspecified: Secondary | ICD-10-CM | POA: Diagnosis not present

## 2019-09-22 DIAGNOSIS — F329 Major depressive disorder, single episode, unspecified: Secondary | ICD-10-CM | POA: Diagnosis not present

## 2019-09-22 DIAGNOSIS — E039 Hypothyroidism, unspecified: Secondary | ICD-10-CM | POA: Diagnosis not present

## 2019-09-22 DIAGNOSIS — Z951 Presence of aortocoronary bypass graft: Secondary | ICD-10-CM | POA: Diagnosis not present

## 2019-09-22 NOTE — Progress Notes (Signed)
Virtual Visit via Telephone Note  I connected with Dalton Vaughan on 09/22/19 at 9:52 AM by telephone and verified that I am speaking with the correct person using two identifiers. Dalton Vaughan is currently located at home and nobody is currently with him during this visit. The provider, Dalton Brooklyn, FNP is located in their home at time of visit.  I discussed the limitations, risks, security and privacy concerns of performing an evaluation and management service by telephone and the availability of in person appointments. I also discussed with the patient that there may be a patient responsible charge related to this service. The patient expressed understanding and agreed to proceed.  Subjective: PCP: Dalton Norlander, DO  Chief Complaint  Patient presents with  . Hospitalization Follow-up  . Knee Problem   Patient was hospitalized at [facility] from 09/15/2019-09/20/2019 due to COVID-19. He was discharged with home health PT in stable improved condition. Patient completed five days of remdesivir and six doses of steroids while in the hospital. He did also receive vitamin K due to supratherapeutic INR. Last INR 3.2 on 09/20/2019. It was recommended that PCP consider outpatient counseling due to recent passing of husband's wife and obtain CBC, CMP, and INR in one week of discharge.   Patient reports he does feel a whole lot better. Patient denies any respiratory symptoms. States he has not had any in about a week; once he got started on the steroids and remdesivir he felt much better. He is scheduled to have a chest CT tomorrow which was ordered prior to his hospitalization. He did have a chest CT while in the hospital.   His continued trouble is his right knee. Patient had a right knee arthroscopy and debridement on 08/28/2019 with Dalton Vaughan. He continues to have pain and limited mobility due to the knee. He does have home health PT which will be coming out today.    ROS: Per HPI   Current Outpatient Medications:  .  amLODipine (NORVASC) 5 MG tablet, Take 1 tablet (5 mg total) by mouth daily. (Patient taking differently: Take 2.5 mg by mouth 2 (two) times daily. ), Disp: 90 tablet, Rfl: 3 .  Calcium Carb-Cholecalciferol (CALCIUM + D3) 600-200 MG-UNIT TABS, Take 1 tablet by mouth daily. , Disp: , Rfl:  .  Cholecalciferol (VITAMIN D-3) 5000 UNITS TABS, Take 1 tablet by mouth daily. , Disp: , Rfl:  .  doxazosin (CARDURA) 8 MG tablet, Take 1 tablet (8 mg total) by mouth at bedtime., Disp: 90 tablet, Rfl: 3 .  furosemide (LASIX) 40 MG tablet, Take 1 tablet (40 mg total) by mouth daily. (Patient not taking: Reported on 09/16/2019), Disp: 30 tablet, Rfl: 3 .  gabapentin (NEURONTIN) 100 MG capsule, Take 1 capsule (100 mg total) by mouth at bedtime., Disp: 30 capsule, Rfl: 1 .  levothyroxine (SYNTHROID) 50 MCG tablet, TAKE 1 TABLET DAILY (Patient taking differently: Take 50 mcg by mouth daily before breakfast. ), Disp: 90 tablet, Rfl: 1 .  mirtazapine (REMERON) 15 MG tablet, Take 1 tablet (15 mg total) by mouth at bedtime., Disp: 30 tablet, Rfl: 1 .  Multiple Vitamin (MULTIVITAMIN) tablet, Take 1 tablet by mouth daily.  , Disp: , Rfl:  .  olopatadine (PATANOL) 0.1 % ophthalmic solution, Place 1 drop into both eyes 2 (two) times daily. , Disp: , Rfl:  .  ramipril (ALTACE) 10 MG capsule, Take 1 capsule (10 mg total) by mouth daily., Disp: 90 capsule, Rfl: 1 .  rosuvastatin (CRESTOR) 5 MG tablet, Take 1 tablet (5 mg total) by mouth daily. as directed (Patient taking differently: Take 5 mg by mouth every Monday, Wednesday, and Friday. ), Disp: 90 tablet, Rfl: 3 .  triamterene-hydrochlorothiazide (MAXZIDE-25) 37.5-25 MG tablet, Take 1 tablet by mouth daily., Disp: , Rfl:  .  vitamin C (ASCORBIC ACID) 500 MG tablet, Take 500 mg by mouth daily. , Disp: , Rfl:  .  warfarin (COUMADIN) 5 MG tablet, TAKE AS DIRECTED, Disp: 90 tablet, Rfl: 3 .  XIIDRA 5 % SOLN, Apply 1 drop to eye daily. , Disp: ,  Rfl:   Allergies  Allergen Reactions  . Paxil [Paroxetine Hcl] Palpitations  . Eliquis [Apixaban] Other (See Comments)    dizziness  . Penicillins Other (See Comments)    Did it involve swelling of the face/tongue/throat, SOB, or low BP? Unknown Did it involve sudden or severe rash/hives, skin peeling, or any reaction on the inside of your mouth or nose? Unknown Did you need to seek medical attention at a hospital or doctor's office? Unknown When did it last happen?unk If all above answers are "NO", may proceed with cephalosporin use.   Dalton Vaughan] Other (See Comments)    myalgias  . Zetia [Ezetimibe] Other (See Comments)    myalgias  . Vytorin [Ezetimibe-Simvastatin] Other (See Comments)    myalgias   Past Medical History:  Diagnosis Date  . Anal fissure   . Atrial fibrillation ([place])   . BPH (benign prostatic hypertrophy)   . CAD (coronary artery disease)    Dalton Vaughan  . Cataract   . COVID-19   . Depression 2004  . Diverticulosis   . GERD (gastroesophageal reflux disease)   . History of TIAs   . Hyperlipidemia   . Hypertension   . Hypertensive heart disease without CHF   . Hypothyroidism   . Internal hemorrhoids   . Lumbar disc disease   . Oral bleeding 08/20/2012   Following molar tooth extraction July, 2013  . Ruptured disk 1995  . Shingles   . Thyroid disease   . Vitamin D deficiency   . Vitreous detachment     Observations/Objective: A&O  No respiratory distress or wheezing audible over the phone Mood, judgement, and thought processes all WNL  Assessment and Plan: 1. Pneumonia due to COVID-19 virus - Symptoms have resolved. Chest CT for tomorrow cancelled; patient aware not to go.  - CBC with Differential/Platelet; Future - CMP14+EGFR; Future  2. History of arthroscopy of right knee - Patient to work with PT at home, they are coming out today. Follow-up with Dalton Vaughan as scheduled. Needs lab work at home due to limited  mobility.  - Ambulatory referral to Dalton Vaughan  3-4. Chronic atrial fibrillation (HCC)/Chronic anticoagulation - Patient is going to start taking warfarin 2.5 mg PO QD and have labs with INR on Monday, October 12th. Last INR 3.2 on 09/20/2019 when he was discharged from the hospital where he received vitamin K. His coumadin was held in the hospital - he was supposed to start a decreased dose when he was discharged but he reports this was not reflected on his discharge papers so he resumed 5 mg daily except 2.5 mg on Mondays.  - Ambulatory referral to [facility]; Future - CoaguChek XS/INR Waived; Future   Follow Up Instructions:  I discussed the assessment and treatment plan with the patient. The patient was provided an opportunity to ask questions and all were answered. The  patient agreed with the plan and demonstrated an understanding of the instructions.   The patient was advised to call back or seek an in-person evaluation if the symptoms worsen or if the condition fails to improve as anticipated.  The above assessment and management plan was discussed with the patient. The patient verbalized understanding of and has agreed to the management plan. Patient is aware to call the clinic if symptoms persist or worsen. Patient is aware when to return to the clinic for a follow-up visit. Patient educated on when it is appropriate to go to the emergency department.   Time call ended: 10:10 AM  I provided 20 minutes of non-face-to-face time during this encounter.  Return in about 3 weeks (around 10/13/2019) for re-check mood with PCP.   Hendricks Limes, MSN, APRN, FNP-C [facility] 09/22/19

## 2019-09-23 ENCOUNTER — Ambulatory Visit (HOSPITAL_COMMUNITY): Payer: Medicare Other

## 2019-09-27 ENCOUNTER — Other Ambulatory Visit: Payer: Self-pay | Admitting: Pharmacist Clinician (PhC)/ Clinical Pharmacy Specialist

## 2019-09-28 DIAGNOSIS — I482 Chronic atrial fibrillation, unspecified: Secondary | ICD-10-CM | POA: Diagnosis not present

## 2019-09-28 DIAGNOSIS — I272 Pulmonary hypertension, unspecified: Secondary | ICD-10-CM | POA: Diagnosis not present

## 2019-09-28 DIAGNOSIS — E782 Mixed hyperlipidemia: Secondary | ICD-10-CM | POA: Diagnosis not present

## 2019-09-28 DIAGNOSIS — I119 Hypertensive heart disease without heart failure: Secondary | ICD-10-CM | POA: Diagnosis not present

## 2019-09-28 DIAGNOSIS — E038 Other specified hypothyroidism: Secondary | ICD-10-CM | POA: Diagnosis not present

## 2019-09-28 DIAGNOSIS — E785 Hyperlipidemia, unspecified: Secondary | ICD-10-CM | POA: Diagnosis not present

## 2019-10-05 ENCOUNTER — Other Ambulatory Visit: Payer: Self-pay

## 2019-10-05 ENCOUNTER — Encounter: Payer: Self-pay | Admitting: Family Medicine

## 2019-10-05 ENCOUNTER — Ambulatory Visit (INDEPENDENT_AMBULATORY_CARE_PROVIDER_SITE_OTHER): Payer: Medicare Other | Admitting: Family Medicine

## 2019-10-05 DIAGNOSIS — R059 Cough, unspecified: Secondary | ICD-10-CM

## 2019-10-05 DIAGNOSIS — R05 Cough: Secondary | ICD-10-CM

## 2019-10-05 MED ORDER — PREDNISONE 20 MG PO TABS: ORAL_TABLET | ORAL | 0 refills | Status: DC

## 2019-10-05 MED ORDER — ALBUTEROL SULFATE HFA 108 (90 BASE) MCG/ACT IN AERS: 2.0000 | INHALATION_SPRAY | Freq: Four times a day (QID) | RESPIRATORY_TRACT | 0 refills | Status: DC | PRN

## 2019-10-05 MED ORDER — BENZONATATE 100 MG PO CAPS: 100.0000 mg | ORAL_CAPSULE | Freq: Three times a day (TID) | ORAL | 0 refills | Status: DC | PRN

## 2019-10-06 ENCOUNTER — Telehealth: Payer: Self-pay | Admitting: *Deleted

## 2019-10-06 NOTE — Telephone Encounter (Signed)
Patient and advance aware and verbalized understanding.

## 2019-10-06 NOTE — Telephone Encounter (Signed)
VM from [facility] w/ [facility] Today INR 1.6 PT 18.8 Pt taking 2.5 mg daily Could not get CBC drawn, will need order to go draw tomorrow

## 2019-10-06 NOTE — Telephone Encounter (Signed)
Dalton Vaughan 09/22/19  3-4. Chronic atrial fibrillation (HCC)/Chronic anticoagulation - Patient is going to start taking warfarin 2.5 mg PO QD and have labs with INR on Monday, October 12th. Last INR 3.2 on 09/20/2019 when he was discharged from the hospital where he received vitamin K. His coumadin was held in the hospital - he was supposed to start a decreased dose when he was discharged but he reports this was not reflected on his discharge papers so he resumed 5 mg daily except 2.5 mg on Mondays.  - Ambulatory referral to [place]; Future - CoaguChek XS/INR Waived; Future    10/06/19 Particia Nearing PA-C, covering    VM from [facility] w/ [facility] Today INR 1.6 PT 18.8 Pt taking 2.5 mg daily Could not get CBC drawn, will need order to go draw tomorrow     This is slightly low, currently on 2.5 mg daily. NEW DOSE:  5 MG MON and FRI, 2.5 mg all other days. Recheck 1 week.  Terald Sleeper PA-C [facility] [address]  [place], [place] [address] [phone]

## 2019-10-06 NOTE — Progress Notes (Signed)
Virtual Visit via telephone Note Due to COVID-19 pandemic this visit was conducted virtually. This visit type was conducted due to national recommendations for restrictions regarding the COVID-19 Pandemic (e.g. social distancing, sheltering in place) in an effort to limit this patient's exposure and mitigate transmission in our community. All issues noted in this document were discussed and addressed.  A physical exam was not performed with this format.   I connected with Dalton Vaughan on 10/05/2019 at 1335 by telephone and verified that I am speaking with the correct person using two identifiers. Dalton Vaughan is currently located at home and family is currently with them during visit. The provider, Monia Pouch, FNP is located in their office at time of visit.  I discussed the limitations, risks, security and privacy concerns of performing an evaluation and management service by telephone and the availability of in person appointments. I also discussed with the patient that there may be a patient responsible charge related to this service. The patient expressed understanding and agreed to proceed.  Subjective:  Patient ID: Dalton Vaughan, male    DOB: [birth year], 85 y.o.   MRN: [id number]  Chief Complaint:  Cough   HPI: Dalton Vaughan is a 85 y.o. male presenting on 10/05/2019 for Cough   Pt reports ongoing cough post hospital admission for COVID-19 associated pneumonia. Pt is visited by the home health nurse a few times per week and his oxygen saturations range from 94 to 97 percent. He remains afebrile. No chest pain or shortness of breath, just persistent cough. He has not taken anything for the cough. He does report wheezing at times.   Cough This is a recurrent problem. The current episode started 1 to 4 weeks ago. The problem has been waxing and waning. The problem occurs every few minutes. The cough is non-productive. Associated symptoms include wheezing. Pertinent negatives include no chest  pain, chills, ear congestion, ear pain, fever, headaches, heartburn, hemoptysis, myalgias, nasal congestion, postnasal drip, rash, rhinorrhea, sore throat, shortness of breath, sweats or weight loss. Nothing aggravates the symptoms. He has tried body position changes and rest for the symptoms. The treatment provided no relief.     Relevant past medical, surgical, family, and social history reviewed and updated as indicated.  Allergies and medications reviewed and updated.   Past Medical History:  Diagnosis Date   Anal fissure    Atrial fibrillation (HCC)    BPH (benign prostatic hypertrophy)    CAD (coronary artery disease)    Dr. Wynonia Lawman   Cataract    COVID-19    Depression 2004   Diverticulosis    GERD (gastroesophageal reflux disease)    History of TIAs    Hyperlipidemia    Hypertension    Hypertensive heart disease without CHF    Hypothyroidism    Internal hemorrhoids    Lumbar disc disease    Oral bleeding 08/20/2012   Following molar tooth extraction July, 2013   Ruptured disk 1995   Shingles    Thyroid disease    Vitamin D deficiency    Vitreous detachment     Past Surgical History:  Procedure Laterality Date   bilateral cataract surgery  6/07   CARDIAC CATHETERIZATION     CORONARY ARTERY BYPASS GRAFT  12/99    Dr. Servando Snare    cysloscopy  8/08   [place] [facility]   right   herninated disc  1995   PROSTATE BIOPSY  8/08   TONSILLECTOMY  AND ADENOIDECTOMY      Social History   Socioeconomic History   Marital status: Widowed    Spouse name: [name]    Number of children: 1   Years of education: 12   Highest education level: Not on file  Occupational History   Occupation: Retired    Hydrologist: Social research officer, government x 27 years   Occupation: Retired    Fish farm manager: Marine scientist    Comment: Librarian, academic x 17 years  Social Designer, fashion/clothing strain: Not hard at [employer] insecurity    Worry: Never true     Inability: Never true   Transportation needs    Medical: No    Non-medical: No  Tobacco Use   Smoking status: Former Smoker    Packs/day: 1.00    Years: 18.00    Pack years: 18.00    Types: Cigarettes    Start date: 12/17/1948    Quit date: 12/17/1966    Years since quitting: 52.8   Smokeless tobacco: Never Used  Substance and Sexual Activity   Alcohol use: Yes    Alcohol/week: 1.0 standard drinks    Types: 1 Standard drinks or equivalent per week    Comment: Occasionally   Drug use: No   Sexual activity: Not on file  Lifestyle   Physical activity    Days per week: 5 days    Minutes per session: 50 min   Stress: To some extent  Relationships   Social connections    Talks on phone: More than three times a week    Gets together: More than three times a week    Attends religious service: Never    Active member of club or organization: No    Attends meetings of clubs or organizations: Never    Relationship status: Married   Intimate partner violence    Fear of current or ex partner: No    Emotionally abused: No    Physically abused: No    Forced sexual activity: No  Other Topics Concern   Not on file  Social History Narrative   Married.  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   amLODipine (NORVASC) 5 MG tablet Take 1 tablet (5 mg total) by mouth daily. (Patient taking differently: Take 2.5 mg by mouth 2 (two) times daily. )   benzonatate (TESSALON PERLES) 100 MG capsule Take 1 capsule (100 mg total) by mouth 3 (three) times daily as needed for cough.   Calcium Carb-Cholecalciferol (CALCIUM + D3) 600-200 MG-UNIT TABS Take 1 tablet by mouth daily.    Cholecalciferol  (VITAMIN D-3) 5000 UNITS TABS Take 1 tablet by mouth daily.    doxazosin (CARDURA) 8 MG tablet Take 1 tablet (8 mg total) by mouth at bedtime.   furosemide (LASIX) 40 MG tablet Take 1 tablet (40 mg total) by mouth daily. (Patient not taking: Reported on 09/16/2019)   gabapentin (NEURONTIN) 100 MG capsule Take 1 capsule (100 mg total) by mouth at bedtime.   levothyroxine (SYNTHROID) 50 MCG tablet TAKE 1 TABLET DAILY (Patient taking differently: Take 50 mcg by mouth daily before breakfast. )   mirtazapine (REMERON) 15 MG tablet  Take 1 tablet (15 mg total) by mouth at bedtime.   Multiple Vitamin (MULTIVITAMIN) tablet Take 1 tablet by mouth daily.     olopatadine (PATANOL) 0.1 % ophthalmic solution Place 1 drop into both eyes 2 (two) times daily.    predniSONE (DELTASONE) 20 MG tablet 2 po at sametime daily for 5 days   ramipril (ALTACE) 10 MG capsule Take 1 capsule (10 mg total) by mouth daily.   rosuvastatin (CRESTOR) 5 MG tablet Take 1 tablet (5 mg total) by mouth daily. as directed (Patient taking differently: Take 5 mg by mouth every Monday, Wednesday, and Friday. )   triamterene-hydrochlorothiazide (MAXZIDE-25) 37.5-25 MG tablet TAKE 1 TABLET DAILY   vitamin C (ASCORBIC ACID) 500 MG tablet Take 500 mg by mouth daily.    warfarin (COUMADIN) 5 MG tablet TAKE AS DIRECTED   XIIDRA 5 % SOLN Apply 1 drop to eye daily.    No facility-administered encounter medications on file as of 10/05/2019.     Allergies  Allergen Reactions   Paxil [Paroxetine Hcl] Palpitations   Eliquis [Apixaban] Other (See Comments)    dizziness   Penicillins Other (See Comments)    Did it involve swelling of the face/tongue/throat, SOB, or low BP? Unknown Did it involve sudden or severe rash/hives, skin peeling, or any reaction on the inside of your mouth or nose? Unknown Did you need to seek medical attention at a hospital or doctor's office? Unknown When did it last happen?unk If all above  answers are NO, may proceed with cephalosporin use.    Pravachol [Pravastatin Sodium] Other (See Comments)    myalgias   Zetia [Ezetimibe] Other (See Comments)    myalgias   Vytorin [Ezetimibe-Simvastatin] Other (See Comments)    myalgias    Review of Systems  Constitutional: Negative for activity change, appetite change, chills, diaphoresis, fatigue, fever, unexpected weight change and weight loss.  HENT: Negative.  Negative for congestion, ear pain, postnasal drip, rhinorrhea and sore throat.   Eyes: Negative.   Respiratory: Positive for cough and wheezing. Negative for hemoptysis, chest tightness and shortness of breath.   Cardiovascular: Negative for chest pain, palpitations and leg swelling.  Gastrointestinal: Negative for abdominal pain, blood in stool, constipation, diarrhea, heartburn, nausea and vomiting.  Endocrine: Negative.   Genitourinary: Negative for decreased urine volume, difficulty urinating, dysuria, frequency and urgency.  Musculoskeletal: Negative for arthralgias and myalgias.  Skin: Negative.  Negative for rash.  Allergic/Immunologic: Negative.   Neurological: Negative for dizziness, syncope, weakness, light-headedness and headaches.  Hematological: Negative.   Psychiatric/Behavioral: Negative for confusion, hallucinations, sleep disturbance and suicidal ideas.  All other systems reviewed and are negative.        Observations/Objective: No vital signs or physical exam, this was a telephone or virtual health encounter.  Pt alert and oriented, answers all questions appropriately, and able to speak in full sentences.    Assessment and Plan: Zameer was seen today for cough.  Diagnoses and all orders for this visit:  Cough in adult Continued cough post COVID-19 associated pneumonia. No fever, chills, weakness, confusion, shortness of breath, or chest pain. Home health nurse reports oxygen saturations 94-97%. Long discussion about return precautions. Will  initiate below for symptomatic care. Increase water intake. Follow up as scheduled.  -     albuterol (VENTOLIN HFA) 108 (90 Base) MCG/ACT inhaler; Inhale 2 puffs into the lungs every 6 (six) hours as needed for wheezing or shortness of breath. -     predniSONE (DELTASONE) 20 MG  tablet; 2 po at sametime daily for 5 days -     benzonatate (TESSALON PERLES) 100 MG capsule; Take 1 capsule (100 mg total) by mouth 3 (three) times daily as needed for cough.     Follow Up Instructions: Return if symptoms worsen or fail to improve.    I discussed the assessment and treatment plan with the patient. The patient was provided an opportunity to ask questions and all were answered. The patient agreed with the plan and demonstrated an understanding of the instructions.   The patient was advised to call back or seek an in-person evaluation if the symptoms worsen or if the condition fails to improve as anticipated.  The above assessment and management plan was discussed with the patient. The patient verbalized understanding of and has agreed to the management plan. Patient is aware to call the clinic if they develop any new symptoms or if symptoms persist or worsen. Patient is aware when to return to the clinic for a follow-up visit. Patient educated on when it is appropriate to go to the emergency department.    I provided 15 minutes of non-face-to-face time during this encounter. The call started at 1335. The call ended at 1350. The other time was used for coordination of care.    Monia Pouch, FNP-C [facility] [address] [place], [place] [address] [phone] 10/05/2019

## 2019-10-07 ENCOUNTER — Other Ambulatory Visit: Payer: Self-pay

## 2019-10-07 ENCOUNTER — Other Ambulatory Visit: Payer: Medicare Other

## 2019-10-07 DIAGNOSIS — E039 Hypothyroidism, unspecified: Secondary | ICD-10-CM

## 2019-10-07 DIAGNOSIS — E782 Mixed hyperlipidemia: Secondary | ICD-10-CM | POA: Diagnosis not present

## 2019-10-07 LAB — CBC WITH DIFFERENTIAL/PLATELET
Basophils Absolute: 0 10*3/uL (ref 0.0–0.2)
Basos: 0 %
EOS (ABSOLUTE): 0.6 10*3/uL — ABNORMAL HIGH (ref 0.0–0.4)
Eos: 9 %
Hematocrit: 32 % — ABNORMAL LOW (ref 37.5–51.0)
Hemoglobin: 10 g/dL — ABNORMAL LOW (ref 13.0–17.7)
Immature Grans (Abs): 0 10*3/uL (ref 0.0–0.1)
Immature Granulocytes: 0 %
Lymphocytes Absolute: 2.2 10*3/uL (ref 0.7–3.1)
Lymphs: 32 %
MCH: 28.6 pg (ref 26.6–33.0)
MCHC: 31.3 g/dL — ABNORMAL LOW (ref 31.5–35.7)
MCV: 91 fL (ref 79–97)
Monocytes Absolute: 0.7 10*3/uL (ref 0.1–0.9)
Monocytes: 10 %
Neutrophils Absolute: 3.4 10*3/uL (ref 1.4–7.0)
Neutrophils: 49 %
Platelets: 271 10*3/uL (ref 150–450)
RBC: 3.5 x10E6/uL — ABNORMAL LOW (ref 4.14–5.80)
RDW: 14.9 % (ref 11.6–15.4)
WBC: 7 10*3/uL (ref 3.4–10.8)

## 2019-10-14 ENCOUNTER — Telehealth: Payer: Self-pay | Admitting: *Deleted

## 2019-10-14 NOTE — Telephone Encounter (Signed)
Vm from [place] w/ [facility] Today INR 1.5  PT 17.8 Patient taking 5 mg on Fri & Sun, 2.5 QOD

## 2019-10-14 NOTE — Telephone Encounter (Signed)
Indication: afib Goal INR: 2-3 Current regimen: 5 mg on Fridays and Sundays with 2.5 mg every other day  Recommendations:  INR subtherapeutic.  Increase to 2.5 mg 3 days/week with 5 mg on Friday and Sunday.  Recheck in 1 week.

## 2019-10-14 NOTE — Telephone Encounter (Signed)
Dalton Vaughan aware and so is Dalton Vaughan

## 2019-10-19 DIAGNOSIS — Z23 Encounter for immunization: Secondary | ICD-10-CM

## 2019-10-19 DIAGNOSIS — M25561 Pain in right knee: Secondary | ICD-10-CM | POA: Diagnosis not present

## 2019-10-20 ENCOUNTER — Telehealth: Payer: Self-pay | Admitting: *Deleted

## 2019-10-20 NOTE — Telephone Encounter (Signed)
Tina aware and verbalized understanding. °

## 2019-10-20 NOTE — Telephone Encounter (Signed)
VM from [facility] w/ [facility] Yesterday's INR 1.6 PT 19.2 Pt takes 5 mg 3 days a week 2.5 all other days

## 2019-10-20 NOTE — Telephone Encounter (Signed)
Increase to 89m 4 days per week and 2.588mdaily all other days. Recheck in 1 week.

## 2019-10-22 DIAGNOSIS — I482 Chronic atrial fibrillation, unspecified: Secondary | ICD-10-CM | POA: Diagnosis not present

## 2019-10-22 DIAGNOSIS — H409 Unspecified glaucoma: Secondary | ICD-10-CM | POA: Diagnosis not present

## 2019-10-22 DIAGNOSIS — Z8673 Personal history of transient ischemic attack (TIA), and cerebral infarction without residual deficits: Secondary | ICD-10-CM | POA: Diagnosis not present

## 2019-10-22 DIAGNOSIS — N400 Enlarged prostate without lower urinary tract symptoms: Secondary | ICD-10-CM | POA: Diagnosis not present

## 2019-10-22 DIAGNOSIS — E785 Hyperlipidemia, unspecified: Secondary | ICD-10-CM | POA: Diagnosis not present

## 2019-10-22 DIAGNOSIS — J1289 Other viral pneumonia: Secondary | ICD-10-CM | POA: Diagnosis not present

## 2019-10-22 DIAGNOSIS — I110 Hypertensive heart disease with heart failure: Secondary | ICD-10-CM | POA: Diagnosis not present

## 2019-10-22 DIAGNOSIS — I2510 Atherosclerotic heart disease of native coronary artery without angina pectoris: Secondary | ICD-10-CM | POA: Diagnosis not present

## 2019-10-22 DIAGNOSIS — I509 Heart failure, unspecified: Secondary | ICD-10-CM | POA: Diagnosis not present

## 2019-10-22 DIAGNOSIS — U071 COVID-19: Secondary | ICD-10-CM | POA: Diagnosis not present

## 2019-10-22 DIAGNOSIS — F329 Major depressive disorder, single episode, unspecified: Secondary | ICD-10-CM | POA: Diagnosis not present

## 2019-10-22 DIAGNOSIS — Z7901 Long term (current) use of anticoagulants: Secondary | ICD-10-CM | POA: Diagnosis not present

## 2019-10-22 DIAGNOSIS — Z5181 Encounter for therapeutic drug level monitoring: Secondary | ICD-10-CM | POA: Diagnosis not present

## 2019-10-22 DIAGNOSIS — M5136 Other intervertebral disc degeneration, lumbar region: Secondary | ICD-10-CM | POA: Diagnosis not present

## 2019-10-22 DIAGNOSIS — E039 Hypothyroidism, unspecified: Secondary | ICD-10-CM | POA: Diagnosis not present

## 2019-10-22 DIAGNOSIS — K219 Gastro-esophageal reflux disease without esophagitis: Secondary | ICD-10-CM | POA: Diagnosis not present

## 2019-10-22 DIAGNOSIS — K5790 Diverticulosis of intestine, part unspecified, without perforation or abscess without bleeding: Secondary | ICD-10-CM | POA: Diagnosis not present

## 2019-10-22 DIAGNOSIS — I495 Sick sinus syndrome: Secondary | ICD-10-CM | POA: Diagnosis not present

## 2019-10-22 DIAGNOSIS — G609 Hereditary and idiopathic neuropathy, unspecified: Secondary | ICD-10-CM | POA: Diagnosis not present

## 2019-10-22 DIAGNOSIS — I700 Atherosclerosis of aorta: Secondary | ICD-10-CM | POA: Diagnosis not present

## 2019-10-22 DIAGNOSIS — E440 Moderate protein-calorie malnutrition: Secondary | ICD-10-CM | POA: Diagnosis not present

## 2019-10-22 DIAGNOSIS — Z951 Presence of aortocoronary bypass graft: Secondary | ICD-10-CM | POA: Diagnosis not present

## 2019-10-22 DIAGNOSIS — E559 Vitamin D deficiency, unspecified: Secondary | ICD-10-CM | POA: Diagnosis not present

## 2019-10-22 DIAGNOSIS — F4320 Adjustment disorder, unspecified: Secondary | ICD-10-CM | POA: Diagnosis not present

## 2019-10-22 DIAGNOSIS — Z87891 Personal history of nicotine dependence: Secondary | ICD-10-CM | POA: Diagnosis not present

## 2019-10-27 ENCOUNTER — Telehealth: Payer: Self-pay | Admitting: *Deleted

## 2019-10-27 DIAGNOSIS — J1289 Other viral pneumonia: Secondary | ICD-10-CM | POA: Diagnosis not present

## 2019-10-27 DIAGNOSIS — I2510 Atherosclerotic heart disease of native coronary artery without angina pectoris: Secondary | ICD-10-CM | POA: Diagnosis not present

## 2019-10-27 DIAGNOSIS — I110 Hypertensive heart disease with heart failure: Secondary | ICD-10-CM | POA: Diagnosis not present

## 2019-10-27 DIAGNOSIS — I509 Heart failure, unspecified: Secondary | ICD-10-CM | POA: Diagnosis not present

## 2019-10-27 DIAGNOSIS — U071 COVID-19: Secondary | ICD-10-CM | POA: Diagnosis not present

## 2019-10-27 DIAGNOSIS — I482 Chronic atrial fibrillation, unspecified: Secondary | ICD-10-CM | POA: Diagnosis not present

## 2019-10-27 NOTE — Telephone Encounter (Signed)
Dalton Vaughan with [facility] aware.

## 2019-10-27 NOTE — Telephone Encounter (Signed)
VM from [place] w/ [facility] Today's INR 1.9 Will not have PT appt next week,will have last PT appt in 2 weeks When does patient need to return to office for INRs

## 2019-10-27 NOTE — Telephone Encounter (Signed)
Indication: Afib Goal INR: 2-3 Current regimen:  54m 4 days per week and 2.547mdaily all other days.   Recommendations:  Increase to 25m56maily except Sundays take 2.25mg79mRecheck in office in 1 week if able.

## 2019-10-28 ENCOUNTER — Ambulatory Visit (INDEPENDENT_AMBULATORY_CARE_PROVIDER_SITE_OTHER): Payer: Medicare Other

## 2019-10-28 ENCOUNTER — Other Ambulatory Visit: Payer: Self-pay

## 2019-10-28 DIAGNOSIS — G609 Hereditary and idiopathic neuropathy, unspecified: Secondary | ICD-10-CM

## 2019-10-28 DIAGNOSIS — F329 Major depressive disorder, single episode, unspecified: Secondary | ICD-10-CM

## 2019-10-28 DIAGNOSIS — E039 Hypothyroidism, unspecified: Secondary | ICD-10-CM

## 2019-10-28 DIAGNOSIS — I482 Chronic atrial fibrillation, unspecified: Secondary | ICD-10-CM

## 2019-10-28 DIAGNOSIS — I110 Hypertensive heart disease with heart failure: Secondary | ICD-10-CM | POA: Diagnosis not present

## 2019-10-28 DIAGNOSIS — I2510 Atherosclerotic heart disease of native coronary artery without angina pectoris: Secondary | ICD-10-CM

## 2019-10-28 DIAGNOSIS — I509 Heart failure, unspecified: Secondary | ICD-10-CM

## 2019-10-28 DIAGNOSIS — H409 Unspecified glaucoma: Secondary | ICD-10-CM

## 2019-10-28 DIAGNOSIS — Z8673 Personal history of transient ischemic attack (TIA), and cerebral infarction without residual deficits: Secondary | ICD-10-CM

## 2019-10-28 DIAGNOSIS — Z87891 Personal history of nicotine dependence: Secondary | ICD-10-CM

## 2019-10-28 DIAGNOSIS — N400 Enlarged prostate without lower urinary tract symptoms: Secondary | ICD-10-CM

## 2019-10-28 DIAGNOSIS — F4320 Adjustment disorder, unspecified: Secondary | ICD-10-CM | POA: Diagnosis not present

## 2019-10-28 DIAGNOSIS — I700 Atherosclerosis of aorta: Secondary | ICD-10-CM | POA: Diagnosis not present

## 2019-10-28 DIAGNOSIS — E559 Vitamin D deficiency, unspecified: Secondary | ICD-10-CM

## 2019-10-28 DIAGNOSIS — K219 Gastro-esophageal reflux disease without esophagitis: Secondary | ICD-10-CM | POA: Diagnosis not present

## 2019-10-28 DIAGNOSIS — K5190 Ulcerative colitis, unspecified, without complications: Secondary | ICD-10-CM

## 2019-10-28 DIAGNOSIS — E440 Moderate protein-calorie malnutrition: Secondary | ICD-10-CM | POA: Diagnosis not present

## 2019-10-28 DIAGNOSIS — Z5181 Encounter for therapeutic drug level monitoring: Secondary | ICD-10-CM

## 2019-10-28 DIAGNOSIS — Z7901 Long term (current) use of anticoagulants: Secondary | ICD-10-CM

## 2019-10-28 DIAGNOSIS — M5136 Other intervertebral disc degeneration, lumbar region: Secondary | ICD-10-CM

## 2019-10-28 DIAGNOSIS — J1289 Other viral pneumonia: Secondary | ICD-10-CM

## 2019-10-28 DIAGNOSIS — Z951 Presence of aortocoronary bypass graft: Secondary | ICD-10-CM

## 2019-10-28 DIAGNOSIS — I495 Sick sinus syndrome: Secondary | ICD-10-CM

## 2019-10-28 DIAGNOSIS — E785 Hyperlipidemia, unspecified: Secondary | ICD-10-CM

## 2019-11-03 ENCOUNTER — Telehealth: Payer: Self-pay | Admitting: *Deleted

## 2019-11-03 NOTE — Telephone Encounter (Signed)
Indication: Afib Goal INR: 2-3 Current regimen:  28m daily except Sundays take 2.589m   Recommendations:  Increase to 17m71maily.  Recheck in office in 1 week if able.

## 2019-11-03 NOTE — Telephone Encounter (Signed)
Aware. 

## 2019-11-03 NOTE — Telephone Encounter (Signed)
VM from [place] w/ [facility] Today's INR 1.8 Today is [facility] last day with patient

## 2019-11-06 ENCOUNTER — Telehealth: Payer: Self-pay | Admitting: *Deleted

## 2019-11-06 DIAGNOSIS — Z7901 Long term (current) use of anticoagulants: Secondary | ICD-10-CM | POA: Diagnosis not present

## 2019-11-06 DIAGNOSIS — I4821 Permanent atrial fibrillation: Secondary | ICD-10-CM | POA: Diagnosis not present

## 2019-11-06 DIAGNOSIS — I482 Chronic atrial fibrillation, unspecified: Secondary | ICD-10-CM

## 2019-11-06 NOTE — Telephone Encounter (Signed)
Fax received mdINR PT/INR self testing service Test date/time 11/06/19 2:37 pm INR 2.4

## 2019-11-06 NOTE — Telephone Encounter (Signed)
Pt aware.

## 2019-11-06 NOTE — Telephone Encounter (Signed)
INR therapeutic.  Ok to continue current regimen.  Repeat INR in 2 weeks.

## 2019-11-20 ENCOUNTER — Telehealth: Payer: Self-pay | Admitting: *Deleted

## 2019-11-20 DIAGNOSIS — Z7901 Long term (current) use of anticoagulants: Secondary | ICD-10-CM

## 2019-11-20 DIAGNOSIS — I482 Chronic atrial fibrillation, unspecified: Secondary | ICD-10-CM

## 2019-11-20 NOTE — Telephone Encounter (Signed)
Pt verbalizes understanding current treatment

## 2019-11-20 NOTE — Telephone Encounter (Signed)
Description   Coumadin 2.5 mg PO QD.   INR 2.2 (goal2.0-3.0) Recheck in 1-2 weeks.     Caryl Pina, MD [facility] 11/20/2019, 12:42 PM

## 2019-11-20 NOTE — Telephone Encounter (Signed)
Fax received mdINR PT/INR self testing service Test date/time 11/20/19 9:59 am INR 2.2

## 2019-11-25 ENCOUNTER — Other Ambulatory Visit: Payer: Self-pay | Admitting: Family Medicine

## 2019-12-01 ENCOUNTER — Telehealth: Payer: Self-pay | Admitting: *Deleted

## 2019-12-01 DIAGNOSIS — I4821 Permanent atrial fibrillation: Secondary | ICD-10-CM | POA: Diagnosis not present

## 2019-12-01 DIAGNOSIS — Z7901 Long term (current) use of anticoagulants: Secondary | ICD-10-CM | POA: Diagnosis not present

## 2019-12-01 DIAGNOSIS — I482 Chronic atrial fibrillation, unspecified: Secondary | ICD-10-CM

## 2019-12-01 NOTE — Telephone Encounter (Signed)
INR at goal.  Continue current regimen.  Recheck in 2 weeks.

## 2019-12-01 NOTE — Telephone Encounter (Signed)
Fax received mdINR PT/INR self testing service Test date/time 12/01/19 1:48 pm INR 2.0

## 2019-12-01 NOTE — Telephone Encounter (Signed)
Aware. 

## 2019-12-02 ENCOUNTER — Other Ambulatory Visit: Payer: Self-pay | Admitting: *Deleted

## 2019-12-02 MED ORDER — LEVOTHYROXINE SODIUM 50 MCG PO TABS: 50.0000 ug | ORAL_TABLET | Freq: Every day | ORAL | 0 refills | Status: DC

## 2019-12-09 ENCOUNTER — Telehealth: Payer: Self-pay | Admitting: *Deleted

## 2019-12-09 DIAGNOSIS — Z7901 Long term (current) use of anticoagulants: Secondary | ICD-10-CM

## 2019-12-09 DIAGNOSIS — I482 Chronic atrial fibrillation, unspecified: Secondary | ICD-10-CM

## 2019-12-09 NOTE — Telephone Encounter (Signed)
Left detailed message on patients answering machine

## 2019-12-09 NOTE — Telephone Encounter (Signed)
INR at goal.  Continue current regimen.  Recheck in 2 weeks.

## 2019-12-09 NOTE — Telephone Encounter (Signed)
Fax received mdINR PT/INR self testing service Test date/time 12/09/19 12:27 pm INR 1.9

## 2019-12-21 ENCOUNTER — Telehealth: Payer: Self-pay | Admitting: *Deleted

## 2019-12-21 DIAGNOSIS — Z7901 Long term (current) use of anticoagulants: Secondary | ICD-10-CM

## 2019-12-21 DIAGNOSIS — I482 Chronic atrial fibrillation, unspecified: Secondary | ICD-10-CM

## 2019-12-21 NOTE — Telephone Encounter (Signed)
Fax received mdINR PT/INR self testing service Test date/time 12/17/19 6:22 pm INR 2.1

## 2019-12-21 NOTE — Telephone Encounter (Signed)
Pt aware of provider feedback and then asked about the COVID vaccine so gave him the number to call with questions [phone] menu option 1

## 2019-12-21 NOTE — Telephone Encounter (Signed)
At goal.  Continue current regimen.  Recheck INR in 2 weeks.

## 2019-12-28 ENCOUNTER — Ambulatory Visit: Payer: Medicare Other | Admitting: Cardiology

## 2019-12-29 ENCOUNTER — Telehealth: Payer: Self-pay | Admitting: Family Medicine

## 2019-12-29 NOTE — Telephone Encounter (Signed)
Pt called stating that he has a self margin system to check INR and the last few times hes tried to prick his finger to get blood to do his tests, he hasn't been able to get enough blood. Says the machine only allows him 15 seconds to get blood. Wants advice. Requested to speak with Dr Marjean Donna nurse.

## 2019-12-30 NOTE — Telephone Encounter (Signed)
LMTCB

## 2019-12-31 ENCOUNTER — Telehealth: Payer: Self-pay | Admitting: Family Medicine

## 2019-12-31 NOTE — Telephone Encounter (Signed)
Appointment given.

## 2020-01-01 ENCOUNTER — Other Ambulatory Visit: Payer: Self-pay

## 2020-01-01 ENCOUNTER — Ambulatory Visit (INDEPENDENT_AMBULATORY_CARE_PROVIDER_SITE_OTHER): Payer: Medicare Other | Admitting: Family Medicine

## 2020-01-01 VITALS — BP 180/100 | HR 65 | Temp 99.0°F | Ht 72.0 in | Wt 185.0 lb

## 2020-01-01 DIAGNOSIS — Z7901 Long term (current) use of anticoagulants: Secondary | ICD-10-CM

## 2020-01-01 DIAGNOSIS — R030 Elevated blood-pressure reading, without diagnosis of hypertension: Secondary | ICD-10-CM

## 2020-01-01 DIAGNOSIS — I482 Chronic atrial fibrillation, unspecified: Secondary | ICD-10-CM

## 2020-01-01 DIAGNOSIS — M62838 Other muscle spasm: Secondary | ICD-10-CM | POA: Diagnosis not present

## 2020-01-01 LAB — POCT INR: INR: 2.2 (ref 2–3)

## 2020-01-01 LAB — COAGUCHEK XS/INR WAIVED
INR: 2.2 — ABNORMAL HIGH (ref 0.9–1.1)
Prothrombin Time: 26.1 s

## 2020-01-01 MED ORDER — TIZANIDINE HCL 4 MG PO TABS: 2.0000 mg | ORAL_TABLET | Freq: Three times a day (TID) | ORAL | 0 refills | Status: DC | PRN

## 2020-01-01 NOTE — Progress Notes (Signed)
Subjective: CC: Shoulder and neck pain PCP: [name], DO HPI:Dalton Vaughan is a 86 y.o. male presenting to clinic today for:  1.  Shoulder neck pain Patient reports onset of shoulder neck pain Monday morning.  He woke up with pain in the right shoulder which subsequently radiated up his right neck and eventually over to the left side of the neck and shoulder.  Pain seems to be worse around the paraspinals of the neck.  Certain positions do cause the pain to become more intense.  The shoulder pain is still present but getting better.  He is not been taking any medicines.  Denies any sensation changes in the hands or arms.  2.  Atrial fibrillation Patient is anticoagulated with Coumadin for atrial fibrillation.  He takes 1 tablet daily except for Mondays and Fridays, he takes only 1/2 tablet.  He previously was trialed on Eliquis by his cardiologist but he had some type of allergic reaction such that they discontinued it and transition back over to Coumadin.  He has an INR machine at home but notes that he is having increased difficulty getting enough blood to check his INR at home.  He would like to transition back to INR checks here in the office.  Denies any abnormal bleeding including hematochezia or melena.   ROS: Per HPI  Allergies  Allergen Reactions  . Paxil [Paroxetine Hcl] Palpitations  . Eliquis [Apixaban] Other (See Comments)    dizziness  . Penicillins Other (See Comments)    Did it involve swelling of the face/tongue/throat, SOB, or low BP? Unknown Did it involve sudden or severe rash/hives, skin peeling, or any reaction on the inside of your mouth or nose? Unknown Did you need to seek medical attention at a hospital or doctor's office? Unknown When did it last happen?unk If all above answers are "NO", may proceed with cephalosporin use.   Christy Sartorius Sodium] Other (See Comments)    myalgias  . Zetia [Ezetimibe] Other (See Comments)    myalgias   . Vytorin [Ezetimibe-Simvastatin] Other (See Comments)    myalgias   Past Medical History:  Diagnosis Date  . Anal fissure   . Atrial fibrillation ([place])   . BPH (benign prostatic hypertrophy)   . CAD (coronary artery disease)    Dr. Wynonia Lawman  . Cataract   . COVID-19   . Depression 2004  . Diverticulosis   . GERD (gastroesophageal reflux disease)   . History of TIAs   . Hyperlipidemia   . Hypertension   . Hypertensive heart disease without CHF   . Hypothyroidism   . Internal hemorrhoids   . Lumbar disc disease   . Oral bleeding 08/20/2012   Following molar tooth extraction July, 2013  . Ruptured disk 1995  . Shingles   . Thyroid disease   . Vitamin D deficiency   . Vitreous detachment     Current Outpatient Medications:  .  metoprolol succinate (TOPROL-XL) 25 MG 24 hr tablet, Take by mouth., Disp: , Rfl:  .  albuterol (VENTOLIN HFA) 108 (90 Base) MCG/ACT inhaler, Inhale 2 puffs into the lungs every 6 (six) hours as needed for wheezing or shortness of breath., Disp: 18 g, Rfl: 0 .  amLODipine (NORVASC) 5 MG tablet, Take 1 tablet (5 mg total) by mouth daily. (Patient taking differently: Take 2.5 mg by mouth 2 (two) times daily. ), Disp: 90 tablet, Rfl: 3 .  benzonatate (TESSALON PERLES) 100 MG capsule, Take 1 capsule (100 mg total)  by mouth 3 (three) times daily as needed for cough., Disp: 20 capsule, Rfl: 0 .  Calcium Carb-Cholecalciferol (CALCIUM + D3) 600-200 MG-UNIT TABS, Take 1 tablet by mouth daily. , Disp: , Rfl:  .  Cholecalciferol (VITAMIN D-3) 5000 UNITS TABS, Take 1 tablet by mouth daily. , Disp: , Rfl:  .  doxazosin (CARDURA) 8 MG tablet, Take 1 tablet (8 mg total) by mouth at bedtime., Disp: 90 tablet, Rfl: 3 .  furosemide (LASIX) 40 MG tablet, Take 1 tablet (40 mg total) by mouth daily. (Patient not taking: Reported on 09/16/2019), Disp: 30 tablet, Rfl: 3 .  gabapentin (NEURONTIN) 100 MG capsule, Take 1 capsule (100 mg total) by mouth at bedtime., Disp: 30 capsule,  Rfl: 1 .  levothyroxine (SYNTHROID) 50 MCG tablet, Take 1 tablet (50 mcg total) by mouth daily. (Needs albwork), Disp: 90 tablet, Rfl: 0 .  mirtazapine (REMERON) 15 MG tablet, Take 1 tablet (15 mg total) by mouth at bedtime., Disp: 30 tablet, Rfl: 1 .  Multiple Vitamin (MULTIVITAMIN) tablet, Take 1 tablet by mouth daily.  , Disp: , Rfl:  .  olopatadine (PATANOL) 0.1 % ophthalmic solution, Place 1 drop into both eyes 2 (two) times daily. , Disp: , Rfl:  .  predniSONE (DELTASONE) 20 MG tablet, 2 po at sametime daily for 5 days, Disp: 10 tablet, Rfl: 0 .  ramipril (ALTACE) 10 MG capsule, Take 1 capsule (10 mg total) by mouth daily., Disp: 90 capsule, Rfl: 1 .  rosuvastatin (CRESTOR) 5 MG tablet, Take 1 tablet (5 mg total) by mouth daily. as directed (Patient taking differently: Take 5 mg by mouth every Monday, Wednesday, and Friday. ), Disp: 90 tablet, Rfl: 3 .  triamterene-hydrochlorothiazide (MAXZIDE-25) 37.5-25 MG tablet, TAKE 1 TABLET DAILY, Disp: 90 tablet, Rfl: 3 .  vitamin C (ASCORBIC ACID) 500 MG tablet, Take 500 mg by mouth daily. , Disp: , Rfl:  .  warfarin (COUMADIN) 5 MG tablet, TAKE AS DIRECTED, Disp: 90 tablet, Rfl: 3 .  XIIDRA 5 % SOLN, Apply 1 drop to eye daily. , Disp: , Rfl:  Social History   Socioeconomic History  . Marital status: Widowed    Spouse name: [name]   . Number of children: 1  . Years of education: 31  . Highest education level: Not on file  Occupational History  . Occupation: Retired    Comment: Social research officer, government x 27 years  . 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 Smoking status: Former Smoker    Packs/day: 1.00    Years: 18.00    Pack years: 18.00    Types: Cigarettes    Start date: 12/17/1948    Quit date: 12/17/1966    Years since quitting: 53.0  . Smokeless tobacco: Never Used  Substance and Sexual Activity  . Alcohol use: Yes    Alcohol/week: 1.0 standard drinks    Types: 1 Standard drinks or  equivalent per week    Comment: Occasionally  . Drug use: No  . Sexual activity: Not on file  Other Topics Concern  . Not on file  Social History Narrative   Married.  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 Difficulty of Paying Living Expenses: Not on file  Food Insecurity:   .  Worried About Charity fundraiser in the Last Year: Not on file  . Ran Out of Food in the Last Year: Not on file  Transportation Needs:   . Lack of Transportation (Medical): Not on file  . Lack of Transportation (Non-Medical): Not on file  Physical Activity:   . Days of Exercise per Week: Not on file  . Minutes of Exercise per Session: Not on file  Stress: Stress Concern Present  . Feeling of Stress : To some extent  Social Connections:   . Frequency of Communication with Friends and Family: Not on file  . Frequency of Social Gatherings with Friends and Family: Not on file  . Attends Religious Services: Not on file  . Active Member of Clubs or Organizations: Not on file  . Attends Archivist Meetings: Not on file  . Marital Status: Not on file  Intimate Partner Violence:   . Fear of Current or Ex-Partner: Not on file  . Emotionally Abused: Not on file  . Physically Abused: Not on file  . Sexually Abused: Not on file   Family History  Problem Relation Age of Onset  . Dementia Mother   . Arthritis Mother   . Cancer Brother        prostate  . Asthma Sister   . Heart disease Paternal Aunt   . Heart disease Paternal Uncle   . Hypertension Maternal Grandmother   . CVA Maternal Grandmother   . CVA Paternal Grandmother   . Hepatitis C Son   . Arthritis Son   . Colon cancer Neg Hx   . Colon polyps Neg Hx   . Kidney disease Neg Hx   . Esophageal cancer Neg Hx   . Gallbladder disease Neg Hx   . Diabetes Neg Hx     Objective: Office vital signs  reviewed. BP (!) 180/100 Comment: manual  Pulse 65   Temp 99 F (37.2 C) (Temporal)   Ht 6' (1.829 m)   Wt 185 lb (83.9 kg)   SpO2 97%   BMI 25.09 kg/m   Physical Examination:  General: Awake, alert, well nourished, No acute distress HEENT: Normal no conjunctival pallor Cardio: Irregularly irregular Pulm: Normal work of breathing on room air MSK: Ambulates independently  Cervical spine: Has limited active range of motion in flexion, extension and rotation secondary to pain.  No midline tenderness palpation but he does have tenderness palpation along the paraspinal muscles, particularly around cervical spine levels 4 and 5.  Shoulders: Increased tonicity noted of the trapezius muscle bilaterally.  Assessment/ Plan: 86 y.o. male   1. Trapezius muscle spasm Have a high suspicion of trapezius muscle spasm given distribution of pain.  Unfortunately is unable to take oral NSAIDs secondary to chronic anticoagulation.  I have instructed him to use topical Voltaren gel 4 times daily if needed for inflammation.  I have given him home physical therapy for neck stretching to reduce spasm and improve range of motion.  I have also added Zanaflex.  We discussed using this half tablet 3 times daily if needed increasing to 1 full tablet.  Caution sedation. - tiZANidine (ZANAFLEX) 4 MG tablet; Take 0.5-1 tablets (2-4 mg total) by mouth every 8 (eight) hours as needed for muscle spasms.  Dispense: 30 tablet; Refill: 0  2. Chronic atrial fibrillation (HCC) Rate controlled.  INR therapeutic at 2.2 today.  Continue current regimen follow-up in 4 weeks for recheck - inr FINGERSTICK - POCT INR  3. Chronic anticoagulation - POCT  INR  4. Elevated blood pressure reading I suspect related to pain.  His blood pressures in office have been borderline hypotensive if anything in the past.  He will follow-up in 2 to 4 weeks for recheck.   No orders of the defined types were placed in this encounter.  No  orders of the defined types were placed in this encounter.    [name], DO [place] [phone]

## 2020-01-01 NOTE — Patient Instructions (Addendum)
Okay to use Voltaren gel on your joints.  I have added a muscle relaxer that you can take up to 3 times daily if needed for muscle spasm.  Use the home physical therapy I discussed with you.  Contact me next week if symptoms or not getting better.  INR is good.  See me back in 4-6 weeks for recheck.

## 2020-01-05 ENCOUNTER — Encounter: Payer: Self-pay | Admitting: Family Medicine

## 2020-01-05 ENCOUNTER — Ambulatory Visit (INDEPENDENT_AMBULATORY_CARE_PROVIDER_SITE_OTHER): Payer: Medicare Other

## 2020-01-05 ENCOUNTER — Ambulatory Visit (INDEPENDENT_AMBULATORY_CARE_PROVIDER_SITE_OTHER): Payer: Medicare Other | Admitting: Family Medicine

## 2020-01-05 ENCOUNTER — Other Ambulatory Visit: Payer: Self-pay

## 2020-01-05 ENCOUNTER — Other Ambulatory Visit: Payer: Self-pay | Admitting: Family Medicine

## 2020-01-05 VITALS — BP 164/79 | HR 88 | Temp 97.5°F | Resp 20 | Ht 72.0 in | Wt 181.0 lb

## 2020-01-05 DIAGNOSIS — M436 Torticollis: Secondary | ICD-10-CM | POA: Diagnosis not present

## 2020-01-05 DIAGNOSIS — M62838 Other muscle spasm: Secondary | ICD-10-CM

## 2020-01-05 DIAGNOSIS — M542 Cervicalgia: Secondary | ICD-10-CM | POA: Diagnosis not present

## 2020-01-05 MED ORDER — PREDNISONE 10 MG (21) PO TBPK: ORAL_TABLET | ORAL | 0 refills | Status: DC

## 2020-01-05 NOTE — Progress Notes (Signed)
Telephone visit  Subjective: CC: neck and back pain PCP: [name], DO HPI:Dalton Vaughan is a 86 y.o. male calls for telephone consult today. Patient provides verbal consent for consult held via phone.  Due to COVID-19 pandemic this visit was conducted virtually. This visit type was conducted due to national recommendations for restrictions regarding the COVID-19 Pandemic (e.g. social distancing, sheltering in place) in an effort to limit this patient's exposure and mitigate transmission in our community. All issues noted in this document were discussed and addressed.  A physical exam was not performed with this format.   Location of patient: home Location of provider: Working remotely from home Others present for call: none  1. Neck and back pain Patient reports worsening neck and upper back pain such that he cannot even lift his head to feed himself today.  He goes on to state that he had a very similar presentation when he was diagnosed with RMSF many years ago.  He was placed on doxycycline and sent to physical therapy and symptoms gradually improved.  He notes that the muscle relaxer is not and is not very helpful.  He is concerned about taking any oral steroids due to inability to sleep when he is on them.  Denies any fevers, rash, recent tick bites but notes he has sustained several tick bites in the past.   ROS: Per HPI  Allergies  Allergen Reactions  . Paxil [Paroxetine Hcl] Palpitations  . Eliquis [Apixaban] Other (See Comments)    dizziness  . Penicillins Other (See Comments)    Did it involve swelling of the face/tongue/throat, SOB, or low BP? Unknown Did it involve sudden or severe rash/hives, skin peeling, or any reaction on the inside of your mouth or nose? Unknown Did you need to seek medical attention at a hospital or doctor's office? Unknown When did it last happen?unk If all above answers are "NO", may proceed with cephalosporin use.   Christy Sartorius Sodium] Other (See Comments)    myalgias  . Zetia [Ezetimibe] Other (See Comments)    myalgias  . Vytorin [Ezetimibe-Simvastatin] Other (See Comments)    myalgias   Past Medical History:  Diagnosis Date  . Anal fissure   . Atrial fibrillation ([place])   . BPH (benign prostatic hypertrophy)   . CAD (coronary artery disease)    Dr. Wynonia Lawman  . Cataract   . COVID-19   . Depression 2004  . Diverticulosis   . GERD (gastroesophageal reflux disease)   . History of TIAs   . Hyperlipidemia   . Hypertension   . Hypertensive heart disease without CHF   . Hypothyroidism   . Internal hemorrhoids   . Lumbar disc disease   . Oral bleeding 08/20/2012   Following molar tooth extraction July, 2013  . Ruptured disk 1995  . Shingles   . Thyroid disease   . Vitamin D deficiency   . Vitreous detachment     Current Outpatient Medications:  .  albuterol (VENTOLIN HFA) 108 (90 Base) MCG/ACT inhaler, Inhale 2 puffs into the lungs every 6 (six) hours as needed for wheezing or shortness of breath., Disp: 18 g, Rfl: 0 .  amLODipine (NORVASC) 5 MG tablet, Take 1 tablet (5 mg total) by mouth daily. (Patient taking differently: Take 2.5 mg by mouth 2 (two) times daily. ), Disp: 90 tablet, Rfl: 3 .  benzonatate (TESSALON PERLES) 100 MG capsule, Take 1 capsule (100 mg total) by mouth 3 (three) times daily as needed for  cough., Disp: 20 capsule, Rfl: 0 .  Calcium Carb-Cholecalciferol (CALCIUM + D3) 600-200 MG-UNIT TABS, Take 1 tablet by mouth daily. , Disp: , Rfl:  .  Cholecalciferol (VITAMIN D-3) 5000 UNITS TABS, Take 1 tablet by mouth daily. , Disp: , Rfl:  .  doxazosin (CARDURA) 8 MG tablet, Take 1 tablet (8 mg total) by mouth at bedtime., Disp: 90 tablet, Rfl: 3 .  furosemide (LASIX) 40 MG tablet, Take 1 tablet (40 mg total) by mouth daily. (Patient not taking: Reported on 09/16/2019), Disp: 30 tablet, Rfl: 3 .  gabapentin (NEURONTIN) 100 MG capsule, Take 1 capsule (100 mg total) by mouth at  bedtime., Disp: 30 capsule, Rfl: 1 .  levothyroxine (SYNTHROID) 50 MCG tablet, Take 1 tablet (50 mcg total) by mouth daily. (Needs albwork), Disp: 90 tablet, Rfl: 0 .  metoprolol succinate (TOPROL-XL) 25 MG 24 hr tablet, Take by mouth., Disp: , Rfl:  .  mirtazapine (REMERON) 15 MG tablet, Take 1 tablet (15 mg total) by mouth at bedtime., Disp: 30 tablet, Rfl: 1 .  Multiple Vitamin (MULTIVITAMIN) tablet, Take 1 tablet by mouth daily.  , Disp: , Rfl:  .  olopatadine (PATANOL) 0.1 % ophthalmic solution, Place 1 drop into both eyes 2 (two) times daily. , Disp: , Rfl:  .  predniSONE (DELTASONE) 20 MG tablet, 2 po at sametime daily for 5 days, Disp: 10 tablet, Rfl: 0 .  ramipril (ALTACE) 10 MG capsule, Take 1 capsule (10 mg total) by mouth daily., Disp: 90 capsule, Rfl: 1 .  rosuvastatin (CRESTOR) 5 MG tablet, Take 1 tablet (5 mg total) by mouth daily. as directed (Patient taking differently: Take 5 mg by mouth every Monday, Wednesday, and Friday. ), Disp: 90 tablet, Rfl: 3 .  tiZANidine (ZANAFLEX) 4 MG tablet, Take 0.5-1 tablets (2-4 mg total) by mouth every 8 (eight) hours as needed for muscle spasms., Disp: 30 tablet, Rfl: 0 .  triamterene-hydrochlorothiazide (MAXZIDE-25) 37.5-25 MG tablet, TAKE 1 TABLET DAILY, Disp: 90 tablet, Rfl: 3 .  vitamin C (ASCORBIC ACID) 500 MG tablet, Take 500 mg by mouth daily. , Disp: , Rfl:  .  warfarin (COUMADIN) 5 MG tablet, TAKE AS DIRECTED, Disp: 90 tablet, Rfl: 3 .  XIIDRA 5 % SOLN, Apply 1 drop to eye daily. , Disp: , Rfl:   Assessment/ Plan: 86 y.o. male   1. Neck muscle spasm I am rescheduling the patient to for the afternoon clinic with the acute care provider today.  I think this patient needs a hands on evaluation.  I reviewed his chart and his MRI from 2017 of the C-spine showed diffuse degenerative changes within the C-spine.  I still think this is likely MSK but think that he needs a formal hands-on physical evaluation given progression of symptoms that are  refractory to muscle relaxers.  We could consider alternative steroid like Medrol Dosepak versus giving him a Depo-Medrol shot.  Perhaps this would disturb his sleep patterns less.  He certainly not a candidate for oral NSAIDs given use of Coumadin.  I have signed this patient out to the acute care provider, Monia Pouch.  She is aware of my concerns and will see him at 235 this afternoon.   Start time: 10:02am End time: 10:13am  Total time spent on patient care (including telephone call/ virtual visit): 20 minutes  [place], [place] [phone]

## 2020-01-05 NOTE — Patient Instructions (Signed)
Musculoskeletal Pain Musculoskeletal pain refers to aches and pains in your bones, joints, muscles, and the tissues that surround them. This pain can occur in any part of the body. It can last for a short time (acute) or a long time (chronic). A physical exam, lab tests, and imaging studies may be done to find the cause of your musculoskeletal pain. Follow these instructions at home:  Lifestyle  Try to control or lower your stress levels. Stress increases muscle tension and can worsen musculoskeletal pain. It is important to recognize when you are anxious or stressed and learn ways to manage it. This may include: ? Meditation or yoga. ? Cognitive or behavioral therapy. ? Acupuncture or massage therapy.  You may continue all activities unless the activities cause more pain. When the pain gets better, slowly resume your normal activities. Gradually increase the intensity and duration of your activities or exercise. Managing pain, stiffness, and swelling  Take over-the-counter and prescription medicines only as told by your health care provider.  When your pain is severe, bed rest may be helpful. Lie or sit in any position that is comfortable, but get out of bed and walk around at least every couple of hours.  If directed, apply heat to the affected area as often as told by your health care provider. Use the heat source that your health care provider recommends, such as a moist heat pack or a heating pad. ? Place a towel between your skin and the heat source. ? Leave the heat on for 20-30 minutes. ? Remove the heat if your skin turns bright red. This is especially important if you are unable to feel pain, heat, or cold. You may have a greater risk of getting burned.  If directed, put ice on the painful area. ? Put ice in a plastic bag. ? Place a towel between your skin and the bag. ? Leave the ice on for 20 minutes, 2-3 times a day. General instructions  Your health care provider may  recommend that you see a physical therapist. This person can help you come up with a safe exercise program. Do any exercises as told by your physical therapist.  Keep all follow-up visits, including any physical therapy visits, as told by your health care providers. This is important. Contact a health care provider if:  Your pain gets worse.  Medicines do not help ease your pain.  You cannot use the part of your body that hurts, such as your arm, leg, or neck.  You have trouble sleeping.  You have trouble doing your normal activities. Get help right away if:  You have a new injury and your pain is worse or different.  You feel numb or you have tingling in the painful area. Summary  Musculoskeletal pain refers to aches and pains in your bones, joints, muscles, and the tissues that surround them.  This pain can occur in any part of the body.  Your health care provider may recommend that you see a physical therapist. This person can help you come up with a safe exercise program. Do any exercises as told by your physical therapist.  Lower your stress level. Stress can worsen musculoskeletal pain. Ways to lower stress may include meditation, yoga, cognitive or behavioral therapy, acupuncture, and massage therapy. This information is not intended to replace advice given to you by your health care provider. Make sure you discuss any questions you have with your health care provider. Document Revised: 11/15/2017 Document Reviewed: 01/02/2017 Elsevier Patient  Education  [employer].

## 2020-01-05 NOTE — Progress Notes (Signed)
Subjective:  Patient ID: Dalton Vaughan, male    DOB: 1929-08-23, 86 y.o.   MRN: [id number]  Patient Care Team: [name], DO as PCP - General (Family Medicine) Jacolyn Reedy, MD as Consulting Physician (Cardiology) Deboraha Sprang, MD (Cardiology) Raynelle Bring, MD as Consulting Physician (Urology) Pyrtle, Lajuan Lines, MD as Consulting Physician (Gastroenterology) Clent Jacks, MD as Consulting Physician (Ophthalmology)   Chief Complaint:  Neck Pain   HPI: Dalton Vaughan is a 86 y.o. male presenting on 01/05/2020 for Neck Pain   Pt presents today for ongoing and worsening neck pain and stiffness. Pt was evaluated in office on 01/01/2020 for same. Pt states he was started on muscle relaxants but they are not helping with the pain. Pt states he is now unable to raise his head completely and the stiffness in his neck is worse. No trouble eating, drinking, or swallowing. No fevers, weakness, paresthesias, loss of function, loss of bowel or bladder, confusion, loss of grip strength,  or gait instability. No known injury. No rash or purpura. Has a history of cervical spine degenerative disease documented on MRI in 2017. Pt is unable to take NSAIDs due to anticoagulation therapy. Pt has not tried PT. He was reluctant to start steroids due to insomnia in the past.   During the interview pt is raising head to look at provider with ease. Pt does have soft neck brace on, states he started wearing this yesterday.   Neck Pain  This is a recurrent problem. The current episode started 1 to 4 weeks ago. The problem occurs constantly. The problem has been gradually worsening. The pain is associated with nothing. The pain is present in the left side and right side. The quality of the pain is described as aching (sharp, tightness). The pain is at a severity of 6/10. The pain is moderate. The symptoms are aggravated by position and twisting. Stiffness is present all day. Pertinent negatives include no  chest pain, fever, headaches, leg pain, numbness, pain with swallowing, paresis, photophobia, syncope, tingling, trouble swallowing, visual change, weakness or weight loss. He has tried muscle relaxants and neck support for the symptoms. The treatment provided no relief.     Relevant past medical, surgical, family, and social history reviewed and updated as indicated.  Allergies and medications reviewed and updated. Date reviewed: Chart in Epic.   Past Medical History:  Diagnosis Date  . Anal fissure   . Atrial fibrillation ([place])   . BPH (benign prostatic hypertrophy)   . CAD (coronary artery disease)    Dr. Wynonia Lawman  . Cataract   . COVID-19   . Depression 2004  . Diverticulosis   . GERD (gastroesophageal reflux disease)   . History of TIAs   . Hyperlipidemia   . Hypertension   . Hypertensive heart disease without CHF   . Hypothyroidism   . Internal hemorrhoids   . Lumbar disc disease   . Oral bleeding 08/20/2012   Following molar tooth extraction July, 2013  . Ruptured disk 1995  . Shingles   . Thyroid disease   . Vitamin D deficiency   . Vitreous detachment     Past Surgical History:  Procedure Laterality Date  . bilateral cataract surgery  6/07  . CARDIAC CATHETERIZATION    . CORONARY ARTERY BYPASS GRAFT  12/99    Dr. Servando Snare   . cysloscopy  8/08  . EYE SURGERY    . [place]   right  .  herninated disc  1995  . PROSTATE BIOPSY  8/08  . TONSILLECTOMY AND ADENOIDECTOMY      Social History   Socioeconomic History  . Marital status: Widowed    Spouse name: Dalton Vaughan   . Number of children: 1  . Years of education: 27  . Highest education level: Not on file  Occupational History  . Occupation: Retired    Comment: Social research officer, government x 27 years  . 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 Smoking status: Former Smoker    Packs/day: 1.00    Years: 18.00    Pack years: 18.00    Types: Cigarettes     Start date: 12/17/1948    Quit date: 12/17/1966    Years since quitting: 53.0  . Smokeless tobacco: Never Used  Substance and Sexual Activity  . Alcohol use: Yes    Alcohol/week: 1.0 standard drinks    Types: 1 Standard drinks or equivalent per week    Comment: Occasionally  . Drug use: No  . Sexual activity: Not on file  Other Topics Concern  . Not on file  Social History Narrative   Married.  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 Difficulty of Paying Living Expenses: Not on file  Food Insecurity:   . Worried About Charity fundraiser in the Last Year: Not on file  . Ran Out of Food in the Last Year: Not on file  Transportation Needs:   . Lack of Transportation (Medical): Not on file  . Lack of Transportation (Non-Medical): Not on file  Physical Activity:   . Days of Exercise per Week: Not on file  . Minutes of Exercise per Session: Not on file  Stress: Stress Concern Present  . Feeling of Stress : To some extent  Social Connections:   . Frequency of Communication with Friends and Family: Not on file  . Frequency of Social Gatherings with Friends and Family: Not on file  . Attends Religious Services: Not on file  . Active Member of Clubs or Organizations: Not on file  . Attends Archivist Meetings: Not on file  . Marital Status: Not on file  Intimate Partner Violence:   . Fear of Current or Ex-Partner: Not on file  . Emotionally Abused: Not on file  . Physically Abused: Not on file  . Sexually Abused: Not on file    Outpatient Encounter Medications as of 01/05/2020  Medication Sig  . amLODipine (NORVASC) 5 MG tablet Take 1 tablet (5 mg total) by mouth daily. (Patient taking differently: Take 2.5 mg by mouth 2 (two) times daily. )  . Calcium Carb-Cholecalciferol (CALCIUM + D3) 600-200 MG-UNIT TABS Take 1 tablet by mouth  daily.   . Cholecalciferol (VITAMIN D-3) 5000 UNITS TABS Take 1 tablet by mouth daily.   Marland Kitchen doxazosin (CARDURA) 8 MG tablet Take 1 tablet (8 mg total) by mouth at bedtime.  Marland Kitchen levothyroxine (SYNTHROID) 50 MCG tablet Take 1 tablet (50 mcg total) by mouth daily. (Needs albwork)  . metoprolol succinate (TOPROL-XL) 25 MG 24 hr tablet Take by mouth.  . mirtazapine (REMERON) 15 MG tablet Take 1 tablet (15 mg total) by mouth at bedtime.  . Multiple Vitamin (MULTIVITAMIN) tablet Take 1 tablet by mouth  daily.    . olopatadine (PATANOL) 0.1 % ophthalmic solution Place 1 drop into both eyes 2 (two) times daily.   . ramipril (ALTACE) 10 MG capsule Take 1 capsule (10 mg total) by mouth daily.  . rosuvastatin (CRESTOR) 5 MG tablet Take 1 tablet (5 mg total) by mouth daily. as directed (Patient taking differently: Take 5 mg by mouth every Monday, Wednesday, and Friday. )  . triamterene-hydrochlorothiazide (MAXZIDE-25) 37.5-25 MG tablet TAKE 1 TABLET DAILY  . vitamin C (ASCORBIC ACID) 500 MG tablet Take 500 mg by mouth daily.   Marland Kitchen warfarin (COUMADIN) 5 MG tablet TAKE AS DIRECTED  . predniSONE (STERAPRED UNI-PAK 21 TAB) 10 MG (21) TBPK tablet As directed x 6 days  . XIIDRA 5 % SOLN Apply 1 drop to eye daily.   . [DISCONTINUED] albuterol (VENTOLIN HFA) 108 (90 Base) MCG/ACT inhaler Inhale 2 puffs into the lungs every 6 (six) hours as needed for wheezing or shortness of breath.  . [DISCONTINUED] benzonatate (TESSALON PERLES) 100 MG capsule Take 1 capsule (100 mg total) by mouth 3 (three) times daily as needed for cough.  . [DISCONTINUED] furosemide (LASIX) 40 MG tablet Take 1 tablet (40 mg total) by mouth daily. (Patient not taking: Reported on 09/16/2019)  . [DISCONTINUED] gabapentin (NEURONTIN) 100 MG capsule Take 1 capsule (100 mg total) by mouth at bedtime.   No facility-administered encounter medications on file as of 01/05/2020.    Allergies  Allergen Reactions  . Paxil [Paroxetine Hcl] Palpitations  .  Eliquis [Apixaban] Other (See Comments)    dizziness  . Penicillins Other (See Comments)    Did it involve swelling of the face/tongue/throat, SOB, or low BP? Unknown Did it involve sudden or severe rash/hives, skin peeling, or any reaction on the inside of your mouth or nose? Unknown Did you need to seek medical attention at a hospital or doctor's office? Unknown When did it last happen?unk If all above answers are "NO", may proceed with cephalosporin use.   Christy Sartorius Sodium] Other (See Comments)    myalgias  . Zetia [Ezetimibe] Other (See Comments)    myalgias  . Vytorin [Ezetimibe-Simvastatin] Other (See Comments)    myalgias    Review of Systems  Constitutional: Negative for activity change, appetite change, chills, diaphoresis, fatigue, fever, unexpected weight change and weight loss.  HENT: Negative.  Negative for trouble swallowing.   Eyes: Negative.  Negative for photophobia and visual disturbance.  Respiratory: Negative for cough, chest tightness and shortness of breath.   Cardiovascular: Negative for chest pain, palpitations, leg swelling and syncope.  Gastrointestinal: Negative for abdominal pain, blood in stool, constipation, diarrhea, nausea and vomiting.  Endocrine: Negative.   Genitourinary: Negative for decreased urine volume, difficulty urinating, dysuria, frequency, hematuria and urgency.  Musculoskeletal: Positive for arthralgias, neck pain and neck stiffness. Negative for gait problem and myalgias.  Skin: Negative.   Allergic/Immunologic: Negative.   Neurological: Negative for dizziness, tingling, tremors, seizures, syncope, facial asymmetry, speech difficulty, weakness, light-headedness, numbness and headaches.  Hematological: Negative.   Psychiatric/Behavioral: Negative for confusion, hallucinations, sleep disturbance and suicidal ideas.  All other systems reviewed and are negative.       Objective:  BP (!) 164/79 (BP Location: Right  Wrist, Cuff Size: Small)   Pulse 88   Temp (!) 97.5 F (36.4 C)   Resp 20   Ht 6' (1.829 m)   Wt 181 lb (82.1 kg)   SpO2 98%   BMI 24.55 kg/m    Wt Readings  from Last 3 Encounters:  01/05/20 181 lb (82.1 kg)  01/01/20 185 lb (83.9 kg)  09/15/19 154 lb 5.2 oz (70 kg)    Physical Exam Vitals and nursing note reviewed.  Constitutional:      General: He is not in acute distress.    Appearance: Normal appearance. He is well-developed, well-groomed and normal weight. He is not ill-appearing, toxic-appearing or diaphoretic.  HENT:     Head: Normocephalic and atraumatic.     Jaw: There is normal jaw occlusion.     Right Ear: Hearing normal.     Left Ear: Hearing normal.     Nose: Nose normal.     Mouth/Throat:     Lips: Pink.     Mouth: Mucous membranes are moist.     Pharynx: Oropharynx is clear. Uvula midline.  Eyes:     General: Lids are normal. No visual field deficit.    Extraocular Movements: Extraocular movements intact.     Conjunctiva/sclera: Conjunctivae normal.     Pupils: Pupils are equal, round, and reactive to light.  Neck:     Thyroid: No thyroid mass, thyromegaly or thyroid tenderness.     Vascular: No carotid bruit or JVD.     Trachea: Trachea and phonation normal. No tracheal tenderness.     Meningeal: Brudzinski's sign and Kernig's sign absent.     Comments: Pain with extension and lateral rotation, no nuchal rigidity. Paraspinous muscle tenderness. No bony tenderness of crepitus. Cardiovascular:     Rate and Rhythm: Normal rate and regular rhythm.     Chest Wall: PMI is not displaced.     Pulses: Normal pulses.     Heart sounds: Normal heart sounds. No murmur. No friction rub. No gallop.   Pulmonary:     Effort: Pulmonary effort is normal. No respiratory distress.     Breath sounds: Normal breath sounds. No wheezing.  Abdominal:     General: Bowel sounds are normal. There is no distension or abdominal bruit.     Palpations: Abdomen is soft. There is no  hepatomegaly or splenomegaly.     Tenderness: There is no abdominal tenderness. There is no right CVA tenderness or left CVA tenderness.     Hernia: No hernia is present.  Musculoskeletal:     Right shoulder: Normal. No tenderness, bony tenderness or crepitus. Normal range of motion. Normal strength. Normal pulse.     Left shoulder: Normal. No tenderness, bony tenderness or crepitus. Normal range of motion. Normal strength. Normal pulse.     Cervical back: Neck supple. Spasms and tenderness present. No swelling, edema, deformity, erythema, signs of trauma, lacerations, rigidity, torticollis, bony tenderness or crepitus. Pain with movement and muscular tenderness present. No spinous process tenderness. Decreased range of motion.     Thoracic back: Normal.     Right lower leg: No edema.     Left lower leg: No edema.  Lymphadenopathy:     Cervical: No cervical adenopathy.  Skin:    General: Skin is warm and dry.     Capillary Refill: Capillary refill takes less than 2 seconds.     Coloration: Skin is not cyanotic, jaundiced or pale.     Findings: No rash.  Neurological:     General: No focal deficit present.     Mental Status: He is alert and oriented to person, place, and time.     Cranial Nerves: Cranial nerves are intact. No cranial nerve deficit, dysarthria or facial asymmetry.     Sensory:  Sensation is intact. No sensory deficit.     Motor: Motor function is intact. No weakness, tremor, atrophy, abnormal muscle tone, seizure activity or pronator drift.     Coordination: Coordination is intact. Romberg sign negative. Coordination normal. Finger-Nose-Finger Test and Heel to [facility] Test normal. Rapid alternating movements normal.     Gait: Gait abnormal (slow).     Deep Tendon Reflexes: Reflexes are normal and symmetric. Reflexes normal.  Psychiatric:        Attention and Perception: Attention and perception normal.        Mood and Affect: Mood and affect normal.        Speech: Speech  normal.        Behavior: Behavior normal. Behavior is cooperative.        Thought Content: Thought content normal.        Cognition and Memory: Cognition and memory normal.        Judgment: Judgment normal.     Results for orders placed or performed in visit on 01/01/20  inr FINGERSTICK  Result Value Ref Range   INR 2.2 (H) 0.9 - 1.1   Prothrombin Time 26.1 sec  POCT INR  Result Value Ref Range   INR 2.2 2 - 3     X-Odies: cervical spine: degenerative changes, limited views due to decreased ROM. No acute findings. Preliminary x-[place] reading by Monia Pouch, FNP-C, [facility].   Pertinent labs & imaging results that were available during my care of the patient were reviewed by me and considered in my medical decision making.  Assessment & Plan:  Mikle was seen today for neck pain.  Diagnoses and all orders for this visit:  Neck pain Neck stiffness Degenerative changes noted on imaging. No acute fractures. Will notify pt if radiology reading differs. No red flags concerning for neurovascular emergency or infectious process. Pt willing to trial prednisone and physical therapy. Will place referral to neurosurgery due to progressive degenerative changes. Pt aware of symptoms that require emergent evaluation and treatment.  -     DG Cervical Spine Complete; Future -     predniSONE (STERAPRED UNI-PAK 21 TAB) 10 MG (21) TBPK tablet; As directed x 6 days -     Ambulatory referral to Physical Therapy -     Ambulatory referral to Neurosurgery  Trapezius muscle spasm Spasm of Paraspinous muscles and left trapezius muscle. Pt will continue muscle relaxer prescribed and initiate steroid dose pack.  -     predniSONE (STERAPRED UNI-PAK 21 TAB) 10 MG (21) TBPK tablet; As directed x 6 days -     Ambulatory referral to Physical Therapy     Continue all other maintenance medications.  Follow up plan: Return if symptoms worsen or fail to improve.  Continue healthy lifestyle choices, including diet  (rich in fruits, vegetables, and lean proteins, and low in salt and simple carbohydrates) and exercise (at least 30 minutes of moderate physical activity daily).  Educational handout given for musculoskeletal pain  The above assessment and management plan was discussed with the patient. The patient verbalized understanding of and has agreed to the management plan. Patient is aware to call the clinic if they develop any new symptoms or if symptoms persist or worsen. Patient is aware when to return to the clinic for a follow-up visit. Patient educated on when it is appropriate to go to the emergency department.   Monia Pouch, FNP-C [facility] [phone]

## 2020-01-12 ENCOUNTER — Encounter: Payer: Self-pay | Admitting: Physical Therapy

## 2020-01-12 ENCOUNTER — Ambulatory Visit: Payer: Medicare Other | Attending: Family Medicine | Admitting: Physical Therapy

## 2020-01-12 ENCOUNTER — Other Ambulatory Visit: Payer: Self-pay

## 2020-01-12 DIAGNOSIS — M542 Cervicalgia: Secondary | ICD-10-CM | POA: Insufficient documentation

## 2020-01-12 DIAGNOSIS — R293 Abnormal posture: Secondary | ICD-10-CM

## 2020-01-12 NOTE — Therapy (Signed)
[facility] [place], [place], [address] Phone: [phone]   Fax:  [phone]  Physical Therapy Evaluation  Patient Details  Name: Dalton Vaughan MRN: [id number] Date of Birth: Apr 06, 1929 Referring Provider (PT): Darla Lesches   Encounter Date: 01/12/2020  PT End of Session - 01/12/20 1112    Visit Number  1    Number of Visits  12    Date for PT Re-Evaluation  04/11/20    Authorization Type  FOTO AT LEAST EVERY 5TH VISIT.  PROGRESS NOTE AT 10TH VISIT.  KX MODIFIER AFTER 15 VISITS.    PT Start Time  1022    PT Stop Time  1108    PT Time Calculation (min)  46 min    Activity Tolerance  Patient tolerated treatment well    Behavior During Therapy  WFL for tasks assessed/performed       Past Medical History:  Diagnosis Date  . Anal fissure   . Atrial fibrillation ([place])   . BPH (benign prostatic hypertrophy)   . CAD (coronary artery disease)    Dr. Wynonia Lawman  . Cataract   . COVID-19   . Depression 2004  . Diverticulosis   . GERD (gastroesophageal reflux disease)   . History of TIAs   . Hyperlipidemia   . Hypertension   . Hypertensive heart disease without CHF   . Hypothyroidism   . Internal hemorrhoids   . Lumbar disc disease   . Oral bleeding 08/20/2012   Following molar tooth extraction July, 2013  . Ruptured disk 1995  . Shingles   . Thyroid disease   . Vitamin D deficiency   . Vitreous detachment     Past Surgical History:  Procedure Laterality Date  . bilateral cataract surgery  6/07  . CARDIAC CATHETERIZATION    . CORONARY ARTERY BYPASS GRAFT  12/99    Dr. Servando Snare   . cysloscopy  8/08  . EYE SURGERY    . HERNIA REPAIR  1997   right  . herninated disc  1995  . PROSTATE BIOPSY  8/08  . TONSILLECTOMY AND ADENOIDECTOMY      There were no vitals filed for this visit.   Subjective Assessment - 01/12/20 1051    Subjective  COVID-19 screen performed prior to patient entering clinic.  The patient  presents to the clinic today with c/o neck that has been ongoing for the last couple of weeks.  He feels it was related to trying to be more active at home and exercising.  His pain is rated at a 5/10 today but increases with movement of his neck.  rest decreases his pain.    Pertinent History  Hernia repair, OA, DDD, CABG, hypothyroidism, H/o TIA's, HTN.    Patient Stated Goals  Get out of pain.    Currently in Pain?  Yes    Pain Score  5     Pain Location  Neck    Pain Orientation  Right;Left    Pain Descriptors / Indicators  Aching    Pain Type  Acute pain    Pain Onset  1 to 4 weeks ago    Pain Frequency  Constant    Aggravating Factors   See above.    Pain Relieving Factors  See above.         [facility] PT Assessment - 01/12/20 0001      Assessment   Medical Diagnosis  Neck pain.    Referring Provider (PT)  Darla Lesches  Onset Date/Surgical Date  --   ~12/28/19.     Precautions   Precautions  --   Carotid vascular disease.     Restrictions   Weight Bearing Restrictions  No      Balance Screen   Has the patient fallen in the past 6 months  No    Has the patient had a decrease in activity level because of a fear of falling?   Yes    Is the patient reluctant to leave their home because of a fear of falling?   No      Prior Function   Level of Independence  Independent      Observation/Other Assessments   Focus on Therapeutic Outcomes (FOTO)   56% limitation.      Posture/Postural Control   Posture Comments  3 inch forward head of rounded shoulders.      Deep Tendon Reflexes   DTR Assessment Site  Biceps;Brachioradialis;Triceps    Biceps DTR  1+    Brachioradialis DTR  1+    Triceps DTR  0      ROM / Strength   AROM / PROM / Strength  AROM;Strength      AROM   Overall AROM Comments  Right cewrvical rotation= 52 degrees, left= 48 degrees, right sidebending= 9 degrees and left= 7 degrees.      Strength   Overall Strength Comments  4+/5 throughout major muscle  groups of shoulders and elbows.  Normal bilateral grip strength.      Palpation   Palpation comment  Tender to palpation over bilateral suboccipital regions left > right.  Also, generally along cervical paraspinal musculature and around C7.      Ambulation/Gait   Gait Comments  Essentially normal.                Objective measurements completed on examination: See above findings.      [facility] Adult PT Treatment/Exercise - 01/12/20 0001      Modalities   Modalities  Electrical Stimulation;Moist Heat      Moist Heat Therapy   Number Minutes Moist Heat  20 Minutes    Moist Heat Location  Cervical      Electrical Stimulation   Electrical Stimulation Location  Affected cervical region.    Electrical Stimulation Action  IFC    Electrical Stimulation Parameters  80-150 Hz x 20 minutes.    Electrical Stimulation Goals  Tone;Pain               PT Short Term Goals - 01/12/20 1214      PT SHORT TERM GOAL #1   Title  Ind with a HEP.    Time  2    Period  Weeks    Status  New        PT Long Term Goals - 01/12/20 1214      PT LONG TERM GOAL #1   Title  Increase active cervical rotation to 65 degrees+ so patient can turn head more easily while driving.    Time  6    Period  Weeks    Status  New      PT LONG TERM GOAL #2   Title  Perform ADL's with pain not > 3/10.    Time  6    Period  Weeks    Status  New             Plan - 01/12/20 1113    Clinical Impression Statement  The patient presents  to OPPT with c/o neck pain over the last couple of weeks.  He has diffuse tender over suboccipital regions (left > right) and diffuse along his cervical paraspinal musculature to C7.  He has limited cervical range of motion and a significant forward head posture.  Patient will benefit from skilled physical therapy intervention to address deficits and pain.    Personal Factors and Comorbidities  Comorbidity 1;Comorbidity 2;Comorbidity 3+    Comorbidities  Hernia  repair, OA, DDD, CABG, hypothyroidism, H/o TIA's, HTN.    Examination-Activity Limitations  Other    Examination-Participation Restrictions  Other    Stability/Clinical Decision Making  Evolving/Moderate complexity    Clinical Decision Making  Low    Rehab Potential  Excellent    PT Frequency  2x / week    PT Duration  6 weeks    PT Treatment/Interventions  ADLs/Self Care Home Management;Cryotherapy;Electrical Stimulation;Ultrasound;Moist Heat;Therapeutic activities;Therapeutic exercise;Patient/family education;Passive range of motion    PT Next Visit Plan  STW/M and suboccipital release technqiue, Combo e'stim/U/S.  Chin tucks and retraction.    Consulted and Agree with Plan of Care  Patient       Patient will benefit from skilled therapeutic intervention in order to improve the following deficits and impairments:  Pain, Decreased activity tolerance, Decreased range of motion, Postural dysfunction  Visit Diagnosis: Cervicalgia - Plan: PT plan of care cert/re-cert  Abnormal posture - Plan: PT plan of care cert/re-cert     Problem List Patient Active Problem List   Diagnosis Date Noted  . Acute respiratory failure with hypoxia ([place]) 09/16/2019  . Osteoarthritis of spine with radiculopathy, lumbar region 01/08/2018  . Thyroid disease   . Aortic atherosclerosis ([place]) 05/02/2017  . Thrombocytopenia ([place]) 07/10/2016  . Hereditary and idiopathic peripheral neuropathy 10/16/2015  . Mononeuropathy 10/04/2015  . Allergic rhinitis 05/06/2014  . Depression 08/03/2013  . Sick sinus syndrome ([place]) 07/31/2012  . Hypertensive heart disease without CHF   . Chronic atrial fibrillation ([place])   . Coronary artery disease involving native coronary artery of native heart with angina pectoris ([place])   . History of TIAs   . Lumbar disc disease   . GERD (gastroesophageal reflux disease)   . Chronic anticoagulation   . Benign prostatic hyperplasia   . Hyperlipidemia     Dalton Vaughan, [place]  MPT 01/12/2020, 12:16 PM  [facility] [address] [place], [place], [address] Phone: [phone]   Fax:  [phone]  Name: Dalton Vaughan MRN: [id number] Date of Birth: 1929/09/20

## 2020-01-14 ENCOUNTER — Encounter: Payer: Self-pay | Admitting: Physical Therapy

## 2020-01-14 ENCOUNTER — Other Ambulatory Visit: Payer: Self-pay

## 2020-01-14 ENCOUNTER — Ambulatory Visit: Payer: Medicare Other | Admitting: Physical Therapy

## 2020-01-14 DIAGNOSIS — M542 Cervicalgia: Secondary | ICD-10-CM | POA: Diagnosis not present

## 2020-01-14 DIAGNOSIS — R293 Abnormal posture: Secondary | ICD-10-CM

## 2020-01-14 NOTE — Therapy (Signed)
[facility] [place], [place], [address] Phone: [phone]   Fax:  [phone]  Physical Therapy Treatment  Patient Details  Name: Dalton Vaughan MRN: [id number] Date of Birth: 1929/06/18 Referring Provider (PT): Darla Lesches   Encounter Date: 01/14/2020  PT End of Session - 01/14/20 1303    Visit Number  2    Number of Visits  12    Date for PT Re-Evaluation  04/11/20    Authorization Type  FOTO AT LEAST EVERY 5TH VISIT.  PROGRESS NOTE AT 10TH VISIT.  KX MODIFIER AFTER 15 VISITS.    PT Start Time  1303    PT Stop Time  1348    PT Time Calculation (min)  45 min       Past Medical History:  Diagnosis Date  . Anal fissure   . Atrial fibrillation ([place])   . BPH (benign prostatic hypertrophy)   . CAD (coronary artery disease)    Dr. Wynonia Lawman  . Cataract   . COVID-19   . Depression 2004  . Diverticulosis   . GERD (gastroesophageal reflux disease)   . History of TIAs   . Hyperlipidemia   . Hypertension   . Hypertensive heart disease without CHF   . Hypothyroidism   . Internal hemorrhoids   . Lumbar disc disease   . Oral bleeding 08/20/2012   Following molar tooth extraction July, 2013  . Ruptured disk 1995  . Shingles   . Thyroid disease   . Vitamin D deficiency   . Vitreous detachment     Past Surgical History:  Procedure Laterality Date  . bilateral cataract surgery  6/07  . CARDIAC CATHETERIZATION    . CORONARY ARTERY BYPASS GRAFT  12/99    Dr. Servando Snare   . cysloscopy  8/08  . EYE SURGERY    . HERNIA REPAIR  1997   right  . herninated disc  1995  . PROSTATE BIOPSY  8/08  . TONSILLECTOMY AND ADENOIDECTOMY      There were no vitals filed for this visit.  Subjective Assessment - 01/14/20 1302    Subjective  COVID 19 screening performed on patient upon arrival. Patient reports less pain than before but still a lot of popping and cracking.    Pertinent History  Hernia repair, OA, DDD, CABG, hypothyroidism,  H/o TIA's, HTN.    Patient Stated Goals  Get out of pain.    Currently in Pain?  Yes    Pain Score  3     Pain Location  Neck    Pain Orientation  Left;Right    Pain Descriptors / Indicators  Discomfort    Pain Type  Acute pain    Pain Onset  1 to 4 weeks ago    Pain Frequency  Constant         [facility] PT Assessment - 01/14/20 0001      Assessment   Medical Diagnosis  Neck pain.    Referring Provider (PT)  Darla Lesches    Onset Date/Surgical Date  12/28/19      Restrictions   Weight Bearing Restrictions  No                   [facility] Adult PT Treatment/Exercise - 01/14/20 0001      Modalities   Modalities  Electrical Stimulation;Moist Heat;Ultrasound      Moist Heat Therapy   Number Minutes Moist Heat  15 Minutes    Moist Heat Location  Cervical  Theme park manager  B C7/ UT region    Location manager Parameters  80-150 hz x15 min    Electrical Stimulation Goals  Tone;Pain      Ultrasound   Ultrasound Location  B UT, cervical paraspinals    Ultrasound Parameters  1.5 w/cm2, 100%,  1 mhz x10 min    Ultrasound Goals  Pain      Manual Therapy   Manual Therapy  Soft tissue mobilization    Soft tissue mobilization  STW to B UT, levator scapula, cervical paraspinals to reduce tone and pain               PT Short Term Goals - 01/12/20 1214      PT SHORT TERM GOAL #1   Title  Ind with a HEP.    Time  2    Period  Weeks    Status  New        PT Long Term Goals - 01/12/20 1214      PT LONG TERM GOAL #1   Title  Increase active cervical rotation to 65 degrees+ so patient can turn head more easily while driving.    Time  6    Period  Weeks    Status  New      PT LONG TERM GOAL #2   Title  Perform ADL's with pain not > 3/10.    Time  6    Period  Weeks    Status  New            Plan - 01/14/20 1340    Clinical Impression Statement  Patient  presented in clinic with reports of cervical pain and tightness in UT region. L minimally more intense than R sided cervical pain and tightness per patient report. Patient indicated soreness during manual therapy to B cervical paraspinals which were palpably much tighter. Patient highly encouraged to continue chin tuck with extension. Patient observed sitting in extreme forward head posture. Normal modalities response noted following removal of the modalities.    Personal Factors and Comorbidities  Comorbidity 1;Comorbidity 2;Comorbidity 3+    Comorbidities  Hernia repair, OA, DDD, CABG, hypothyroidism, H/o TIA's, HTN.    Examination-Activity Limitations  Other    Examination-Participation Restrictions  Other    Stability/Clinical Decision Making  Evolving/Moderate complexity    Rehab Potential  Excellent    PT Frequency  2x / week    PT Duration  6 weeks    PT Treatment/Interventions  ADLs/Self Care Home Management;Cryotherapy;Electrical Stimulation;Ultrasound;Moist Heat;Therapeutic activities;Therapeutic exercise;Patient/family education;Passive range of motion    PT Next Visit Plan  STW/M and suboccipital release technqiue, Combo e'stim/U/S.  Chin tucks and retraction.    Consulted and Agree with Plan of Care  Patient       Patient will benefit from skilled therapeutic intervention in order to improve the following deficits and impairments:  Pain, Decreased activity tolerance, Decreased range of motion, Postural dysfunction  Visit Diagnosis: Cervicalgia  Abnormal posture     Problem List Patient Active Problem List   Diagnosis Date Noted  . Acute respiratory failure with hypoxia ([place]) 09/16/2019  . Osteoarthritis of spine with radiculopathy, lumbar region 01/08/2018  . Thyroid disease   . Aortic atherosclerosis ([place]) 05/02/2017  . Thrombocytopenia ([place]) 07/10/2016  . Hereditary and idiopathic peripheral neuropathy 10/16/2015  . Mononeuropathy 10/04/2015  . Allergic rhinitis  05/06/2014  . Depression 08/03/2013  . Sick sinus syndrome (  [place]) 07/31/2012  . Hypertensive heart disease without CHF   . Chronic atrial fibrillation ([place])   . Coronary artery disease involving native coronary artery of native heart with angina pectoris ([place])   . History of TIAs   . Lumbar disc disease   . GERD (gastroesophageal reflux disease)   . Chronic anticoagulation   . Benign prostatic hyperplasia   . Hyperlipidemia     Standley Brooking, PTA 01/14/2020, 2:24 PM  [facility] [address] [place], [place], [address] Phone: [phone]   Fax:  [phone]  Name: Dalton Vaughan MRN: [id number] Date of Birth: 1929/06/30

## 2020-01-19 ENCOUNTER — Other Ambulatory Visit: Payer: Self-pay

## 2020-01-19 ENCOUNTER — Ambulatory Visit: Payer: Medicare Other | Attending: Family Medicine | Admitting: Physical Therapy

## 2020-01-19 DIAGNOSIS — R293 Abnormal posture: Secondary | ICD-10-CM | POA: Insufficient documentation

## 2020-01-19 DIAGNOSIS — M542 Cervicalgia: Secondary | ICD-10-CM | POA: Diagnosis present

## 2020-01-19 NOTE — Therapy (Signed)
[facility] [place], [place], [address] Phone: [phone]   Fax:  [phone]  Physical Therapy Treatment  Patient Details  Name: Dalton Vaughan MRN: [id number] Date of Birth: 1928/12/26 Referring Provider (PT): Darla Lesches   Encounter Date: 01/19/2020  PT End of Session - 01/19/20 1231    Visit Number  3    Number of Visits  12    Date for PT Re-Evaluation  04/11/20    Authorization Type  FOTO AT LEAST EVERY 5TH VISIT.  PROGRESS NOTE AT 10TH VISIT.  KX MODIFIER AFTER 15 VISITS.    PT Start Time  1115    PT Stop Time  1208    PT Time Calculation (min)  53 min    Activity Tolerance  Patient tolerated treatment well    Behavior During Therapy  WFL for tasks assessed/performed       Past Medical History:  Diagnosis Date  . Anal fissure   . Atrial fibrillation ([place])   . BPH (benign prostatic hypertrophy)   . CAD (coronary artery disease)    Dr. Wynonia Lawman  . Cataract   . COVID-19   . Depression 2004  . Diverticulosis   . GERD (gastroesophageal reflux disease)   . History of TIAs   . Hyperlipidemia   . Hypertension   . Hypertensive heart disease without CHF   . Hypothyroidism   . Internal hemorrhoids   . Lumbar disc disease   . Oral bleeding 08/20/2012   Following molar tooth extraction July, 2013  . Ruptured disk 1995  . Shingles   . Thyroid disease   . Vitamin D deficiency   . Vitreous detachment     Past Surgical History:  Procedure Laterality Date  . bilateral cataract surgery  6/07  . CARDIAC CATHETERIZATION    . CORONARY ARTERY BYPASS GRAFT  12/99    Dr. Servando Snare   . cysloscopy  8/08  . EYE SURGERY    . HERNIA REPAIR  1997   right  . herninated disc  1995  . PROSTATE BIOPSY  8/08  . TONSILLECTOMY AND ADENOIDECTOMY      There were no vitals filed for this visit.  Subjective Assessment - 01/19/20 1228    Subjective  COVID-19 screen performed prior to patient entering clinic.  Neck feels good.    Pertinent History  Hernia repair, OA, DDD, CABG, hypothyroidism, H/o TIA's, HTN.    Patient Stated Goals  Get out of pain.    Currently in Pain?  Yes    Pain Score  3     Pain Location  Neck    Pain Orientation  Right;Left    Pain Descriptors / Indicators  Discomfort    Pain Type  Acute pain    Pain Onset  1 to 4 weeks ago                       [facility] Adult PT Treatment/Exercise - 01/19/20 0001      Modalities   Modalities  Electrical Stimulation;Moist Heat;Ultrasound      Moist Heat Therapy   Number Minutes Moist Heat  20 Minutes    Moist Heat Location  --   Bilateral cervical     Electrical Stimulation   Electrical Stimulation Location  Bilateral cervial.    Electrical Stimulation Action  IFC    Electrical Stimulation Parameters  80-150 Hz x 20 minutes at 100% scan.    Electrical Stimulation Goals  Tone;Pain  Ultrasound   Ultrasound Location  Bilateral cervical.    Ultrasound Parameters  Combo e'stim/U/S at 1.50 W/CM2 x 12 minutes.    Ultrasound Goals  Pain      Manual Therapy   Manual Therapy  Soft tissue mobilization    Soft tissue mobilization  STW/M x 12 minutes to bilateral cervical musculature.               PT Short Term Goals - 01/12/20 1214      PT SHORT TERM GOAL #1   Title  Ind with a HEP.    Time  2    Period  Weeks    Status  New        PT Long Term Goals - 01/12/20 1214      PT LONG TERM GOAL #1   Title  Increase active cervical rotation to 65 degrees+ so patient can turn head more easily while driving.    Time  6    Period  Weeks    Status  New      PT LONG TERM GOAL #2   Title  Perform ADL's with pain not > 3/10.    Time  6    Period  Weeks    Status  New            Plan - 01/19/20 1229    Clinical Impression Statement  Great response to treatment thus far.  he remains palpably tender over his bilateral cervical musculature however.    Personal Factors and Comorbidities  Comorbidity 1;Comorbidity  2;Comorbidity 3+    Comorbidities  Hernia repair, OA, DDD, CABG, hypothyroidism, H/o TIA's, HTN.    Examination-Activity Limitations  Other    Examination-Participation Restrictions  Other    Stability/Clinical Decision Making  Evolving/Moderate complexity    Rehab Potential  Excellent    PT Frequency  2x / week    PT Duration  6 weeks    PT Treatment/Interventions  ADLs/Self Care Home Management;Cryotherapy;Electrical Stimulation;Ultrasound;Moist Heat;Therapeutic activities;Therapeutic exercise;Patient/family education;Passive range of motion    PT Next Visit Plan  STW/M and suboccipital release technqiue, Combo e'stim/U/S.  Chin tucks and retraction.    Consulted and Agree with Plan of Care  Patient       Patient will benefit from skilled therapeutic intervention in order to improve the following deficits and impairments:     Visit Diagnosis: Cervicalgia  Abnormal posture     Problem List Patient Active Problem List   Diagnosis Date Noted  . Acute respiratory failure with hypoxia ([place]) 09/16/2019  . Osteoarthritis of spine with radiculopathy, lumbar region 01/08/2018  . Thyroid disease   . Aortic atherosclerosis ([place]) 05/02/2017  . Thrombocytopenia ([place]) 07/10/2016  . Hereditary and idiopathic peripheral neuropathy 10/16/2015  . Mononeuropathy 10/04/2015  . Allergic rhinitis 05/06/2014  . Depression 08/03/2013  . Sick sinus syndrome ([place]) 07/31/2012  . Hypertensive heart disease without CHF   . Chronic atrial fibrillation ([place])   . Coronary artery disease involving native coronary artery of native heart with angina pectoris ([place])   . History of TIAs   . Lumbar disc disease   . GERD (gastroesophageal reflux disease)   . Chronic anticoagulation   . Benign prostatic hyperplasia   . Hyperlipidemia     Eusebio Blazejewski, [place] MPT 01/19/2020, 12:32 PM  [facility] [place], [place], [address] Phone: [phone]   Fax:   [phone]  Name: Dalton Vaughan MRN: [id number] Date of Birth: 01/21/1929

## 2020-01-21 ENCOUNTER — Other Ambulatory Visit: Payer: Self-pay

## 2020-01-21 ENCOUNTER — Ambulatory Visit: Payer: Medicare Other | Admitting: Physical Therapy

## 2020-01-21 ENCOUNTER — Encounter: Payer: Self-pay | Admitting: Physical Therapy

## 2020-01-21 DIAGNOSIS — M542 Cervicalgia: Secondary | ICD-10-CM

## 2020-01-21 DIAGNOSIS — R293 Abnormal posture: Secondary | ICD-10-CM

## 2020-01-21 NOTE — Therapy (Signed)
[facility] [place], [place], [address] Phone: [phone]   Fax:  [phone]  Physical Therapy Treatment  Patient Details  Name: Dalton Vaughan MRN: [id number] Date of Birth: 06-13-1929 Referring Provider (PT): Darla Lesches   Encounter Date: 01/21/2020  PT End of Session - 01/21/20 1209    Visit Number  4    Number of Visits  12    Date for PT Re-Evaluation  04/11/20    Authorization Type  FOTO AT LEAST EVERY 5TH VISIT.  PROGRESS NOTE AT 10TH VISIT.  KX MODIFIER AFTER 15 VISITS.    PT Start Time  1117    PT Stop Time  1159    PT Time Calculation (min)  42 min    Activity Tolerance  Patient tolerated treatment well    Behavior During Therapy  WFL for tasks assessed/performed       Past Medical History:  Diagnosis Date  . Anal fissure   . Atrial fibrillation ([place])   . BPH (benign prostatic hypertrophy)   . CAD (coronary artery disease)    Dr. Wynonia Lawman  . Cataract   . COVID-19   . Depression 2004  . Diverticulosis   . GERD (gastroesophageal reflux disease)   . History of TIAs   . Hyperlipidemia   . Hypertension   . Hypertensive heart disease without CHF   . Hypothyroidism   . Internal hemorrhoids   . Lumbar disc disease   . Oral bleeding 08/20/2012   Following molar tooth extraction July, 2013  . Ruptured disk 1995  . Shingles   . Thyroid disease   . Vitamin D deficiency   . Vitreous detachment     Past Surgical History:  Procedure Laterality Date  . bilateral cataract surgery  6/07  . CARDIAC CATHETERIZATION    . CORONARY ARTERY BYPASS GRAFT  12/99    Dr. Servando Snare   . cysloscopy  8/08  . EYE SURGERY    . HERNIA REPAIR  1997   right  . herninated disc  1995  . PROSTATE BIOPSY  8/08  . TONSILLECTOMY AND ADENOIDECTOMY      There were no vitals filed for this visit.  Subjective Assessment - 01/21/20 1118    Subjective  COVID-19 screen performed prior to patient entering clinic.  Patient arrived with  some discomfort    Pertinent History  Hernia repair, OA, DDD, CABG, hypothyroidism, H/o TIA's, HTN.    Patient Stated Goals  Get out of pain.    Currently in Pain?  Yes    Pain Score  3     Pain Location  Neck    Pain Orientation  Right;Left    Pain Descriptors / Indicators  Discomfort    Pain Type  Acute pain    Pain Onset  1 to 4 weeks ago    Pain Frequency  Constant    Aggravating Factors   movement    Pain Relieving Factors  rest                       [facility] Adult PT Treatment/Exercise - 01/21/20 0001      Moist Heat Therapy   Number Minutes Moist Heat  15 Minutes    Moist Heat Location  Cervical      Electrical Stimulation   Electrical Stimulation Location  Bilateral cervial.    Electrical Stimulation Action  IFC    Electrical Stimulation Parameters  80-150hx53mn    Electrical Stimulation Goals  Tone;Pain      Ultrasound   Ultrasound Location  cervical bil    Ultrasound Parameters  [place] @ 1.5w/cm2x54mn    Ultrasound Goals  Pain      Manual Therapy   Manual Therapy  Soft tissue mobilization    Soft tissue mobilization  manual STW to bil cervical spine and UT to reduce pain and tone                PT Short Term Goals - 01/21/20 1211      PT SHORT TERM GOAL #1   Title  Ind with a HEP.    Time  2    Period  Weeks    Status  On-going        PT Long Term Goals - 01/21/20 1211      PT LONG TERM GOAL #1   Title  Increase active cervical rotation to 65 degrees+ so patient can turn head more easily while driving.    Time  6    Period  Weeks    Status  On-going      PT LONG TERM GOAL #2   Title  Perform ADL's with pain not > 3/10.    Time  6    Period  Weeks    Status  On-going            Plan - 01/21/20 1211    Clinical Impression Statement  Patient tolerated treatment well today. Patient has ongoing pain and limitations with movement. Patient had palpable tightness in cervical spine and UT's today. Patient felt improvement  after treatment today. Patient current goals ongoing due to pain and mobility deficits.    Personal Factors and Comorbidities  Comorbidity 1;Comorbidity 2;Comorbidity 3+    Comorbidities  Hernia repair, OA, DDD, CABG, hypothyroidism, H/o TIA's, HTN.    Examination-Activity Limitations  Other    Examination-Participation Restrictions  Other    Stability/Clinical Decision Making  Evolving/Moderate complexity    Rehab Potential  Excellent    PT Frequency  2x / week    PT Duration  6 weeks    PT Treatment/Interventions  ADLs/Self Care Home Management;Cryotherapy;Electrical Stimulation;Ultrasound;Moist Heat;Therapeutic activities;Therapeutic exercise;Patient/family education;Passive range of motion    PT Next Visit Plan  cont with POC for STW/M and suboccipital release technqiue, Combo e'stim/U/S.  Chin tucks and retraction.    Consulted and Agree with Plan of Care  Patient       Patient will benefit from skilled therapeutic intervention in order to improve the following deficits and impairments:  Pain, Decreased activity tolerance, Decreased range of motion, Postural dysfunction  Visit Diagnosis: Cervicalgia  Abnormal posture     Problem List Patient Active Problem List   Diagnosis Date Noted  . Acute respiratory failure with hypoxia ([place] 09/16/2019  . Osteoarthritis of spine with radiculopathy, lumbar region 01/08/2018  . Thyroid disease   . Aortic atherosclerosis ([place] 05/02/2017  . Thrombocytopenia ([place] 07/10/2016  . Hereditary and idiopathic peripheral neuropathy 10/16/2015  . Mononeuropathy 10/04/2015  . Allergic rhinitis 05/06/2014  . Depression 08/03/2013  . Sick sinus syndrome ([place] 07/31/2012  . Hypertensive heart disease without CHF   . Chronic atrial fibrillation ([place]   . Coronary artery disease involving native coronary artery of native heart with angina pectoris ([place]   . History of TIAs   . Lumbar disc disease   . GERD (gastroesophageal reflux disease)   .  Chronic anticoagulation   . Benign prostatic hyperplasia   . Hyperlipidemia  Phillips Climes, PTA 01/21/2020, 12:14 PM  [facility] [address] [place], [place], [address] Phone: [phone]   Fax:  [phone]  Name: Dalton Vaughan MRN: [id number] Date of Birth: 07-04-1929

## 2020-01-25 DIAGNOSIS — M5416 Radiculopathy, lumbar region: Secondary | ICD-10-CM | POA: Insufficient documentation

## 2020-01-25 DIAGNOSIS — I10 Essential (primary) hypertension: Secondary | ICD-10-CM | POA: Insufficient documentation

## 2020-01-26 ENCOUNTER — Other Ambulatory Visit: Payer: Self-pay

## 2020-01-26 ENCOUNTER — Ambulatory Visit: Payer: Medicare Other | Admitting: *Deleted

## 2020-01-26 DIAGNOSIS — M542 Cervicalgia: Secondary | ICD-10-CM

## 2020-01-26 DIAGNOSIS — R293 Abnormal posture: Secondary | ICD-10-CM

## 2020-01-26 NOTE — Therapy (Signed)
[facility] [place], [place], [address] Phone: [phone]   Fax:  [phone]  Physical Therapy Treatment  Patient Details  Name: Dalton Vaughan MRN: [id number] Date of Birth: May 27, 1929 Referring Provider (PT): Darla Lesches   Encounter Date: 01/26/2020  PT End of Session - 01/26/20 1251    Visit Number  5    Number of Visits  12    Date for PT Re-Evaluation  04/11/20    PT Start Time  1115    PT Stop Time  5456    PT Time Calculation (min)  50 min       Past Medical History:  Diagnosis Date  . Anal fissure   . Atrial fibrillation ([place])   . BPH (benign prostatic hypertrophy)   . CAD (coronary artery disease)    Dr. Wynonia Lawman  . Cataract   . COVID-19   . Depression 2004  . Diverticulosis   . GERD (gastroesophageal reflux disease)   . History of TIAs   . Hyperlipidemia   . Hypertension   . Hypertensive heart disease without CHF   . Hypothyroidism   . Internal hemorrhoids   . Lumbar disc disease   . Oral bleeding 08/20/2012   Following molar tooth extraction July, 2013  . Ruptured disk 1995  . Shingles   . Thyroid disease   . Vitamin D deficiency   . Vitreous detachment     Past Surgical History:  Procedure Laterality Date  . bilateral cataract surgery  6/07  . CARDIAC CATHETERIZATION    . CORONARY ARTERY BYPASS GRAFT  12/99    Dr. Servando Snare   . cysloscopy  8/08  . EYE SURGERY    . HERNIA REPAIR  1997   right  . herninated disc  1995  . PROSTATE BIOPSY  8/08  . TONSILLECTOMY AND ADENOIDECTOMY      There were no vitals filed for this visit.  Subjective Assessment - 01/26/20 1120    Subjective  COVID-19 screen performed prior to patient entering clinic.  Patient arrived with some discomfort in his neck, but mainly LT side    Pertinent History  Hernia repair, OA, DDD, CABG, hypothyroidism, H/o TIA's, HTN.                       [facility] Adult PT Treatment/Exercise - 01/26/20 0001      Modalities   Modalities  Electrical Stimulation;Moist Heat;Ultrasound      Moist Heat Therapy   Number Minutes Moist Heat  15 Minutes    Moist Heat Location  Cervical      Electrical Stimulation   Electrical Stimulation Location  Bilateral cervial.    Electrical Stimulation Action  IFC    Electrical Stimulation Parameters  80-150hz  x 15 mins    Electrical Stimulation Goals  Tone;Pain      Ultrasound   Ultrasound Location  LT side cerv paras    Ultrasound Parameters  [place] @ 1.5 w/cm2 x 10 mins    Ultrasound Goals  Pain      Manual Therapy   Manual Therapy  Soft tissue mobilization    Soft tissue mobilization  manual STW to LT side cervical spine and UT to reduce pain and tone                PT Short Term Goals - 01/21/20 1211      PT SHORT TERM GOAL #1   Title  Ind with a HEP.  Time  2    Period  Weeks    Status  On-going        PT Long Term Goals - 01/21/20 1211      PT LONG TERM GOAL #1   Title  Increase active cervical rotation to 65 degrees+ so patient can turn head more easily while driving.    Time  6    Period  Weeks    Status  On-going      PT LONG TERM GOAL #2   Title  Perform ADL's with pain not > 3/10.    Time  6    Period  Weeks    Status  On-going            Plan - 01/26/20 1253    Clinical Impression Statement  Pt arrived today doing fairly well, but reports increased pain in his neck LT side. Rx focused on LT side with [place] and STW. Pt had notable soreness / tightness LT side , but with good release after session.    Personal Factors and Comorbidities  Comorbidity 1;Comorbidity 2;Comorbidity 3+    Comorbidities  Hernia repair, OA, DDD, CABG, hypothyroidism, H/o TIA's, HTN.    Examination-Activity Limitations  Other    Examination-Participation Restrictions  Other    Stability/Clinical Decision Making  Evolving/Moderate complexity    Rehab Potential  Excellent    PT Frequency  2x / week    PT Duration  6 weeks    PT Next Visit Plan   cont with POC for STW/M and suboccipital release technqiue, Combo e'stim/U/S.  Chin tucks and retraction.    Consulted and Agree with Plan of Care  Patient       Patient will benefit from skilled therapeutic intervention in order to improve the following deficits and impairments:  Pain, Decreased activity tolerance, Decreased range of motion, Postural dysfunction  Visit Diagnosis: Cervicalgia  Abnormal posture     Problem List Patient Active Problem List   Diagnosis Date Noted  . Acute respiratory failure with hypoxia ([place]) 09/16/2019  . Osteoarthritis of spine with radiculopathy, lumbar region 01/08/2018  . Thyroid disease   . Aortic atherosclerosis ([place]) 05/02/2017  . Thrombocytopenia ([place]) 07/10/2016  . Hereditary and idiopathic peripheral neuropathy 10/16/2015  . Mononeuropathy 10/04/2015  . Allergic rhinitis 05/06/2014  . Depression 08/03/2013  . Sick sinus syndrome ([place]) 07/31/2012  . Hypertensive heart disease without CHF   . Chronic atrial fibrillation ([place])   . Coronary artery disease involving native coronary artery of native heart with angina pectoris ([place])   . History of TIAs   . Lumbar disc disease   . GERD (gastroesophageal reflux disease)   . Chronic anticoagulation   . Benign prostatic hyperplasia   . Hyperlipidemia     Loree Shehata,CHRIS , PTA 01/26/2020, 1:08 PM  [facility] [place], [place], [address] Phone: [phone]   Fax:  [phone]  Name: Dalton Vaughan MRN: [id number] Date of Birth: 1929/04/19

## 2020-01-27 ENCOUNTER — Telehealth: Payer: Self-pay | Admitting: Family Medicine

## 2020-01-28 ENCOUNTER — Other Ambulatory Visit: Payer: Self-pay

## 2020-01-28 ENCOUNTER — Ambulatory Visit: Payer: Medicare Other | Admitting: Physical Therapy

## 2020-01-28 DIAGNOSIS — M542 Cervicalgia: Secondary | ICD-10-CM | POA: Diagnosis not present

## 2020-01-28 DIAGNOSIS — R293 Abnormal posture: Secondary | ICD-10-CM

## 2020-01-28 NOTE — Therapy (Addendum)
[facility] [place], [place], [address] Phone: [phone]   Fax:  [phone]  Physical Therapy Treatment  Patient Details  Name: Dalton Vaughan MRN: [id number] Date of Birth: Apr 25, 1929 Referring Provider (PT): Darla Lesches   Encounter Date: 01/28/2020  PT End of Session - 01/28/20 1310    Visit Number  6    Number of Visits  12    Date for PT Re-Evaluation  04/11/20    Authorization Type  FOTO AT LEAST EVERY 5TH VISIT.  PROGRESS NOTE AT 10TH VISIT.  KX MODIFIER AFTER 15 VISITS.    PT Start Time  0945    PT Stop Time  1041    PT Time Calculation (min)  56 min    Activity Tolerance  Patient tolerated treatment well    Behavior During Therapy  WFL for tasks assessed/performed       Past Medical History:  Diagnosis Date  . Anal fissure   . Atrial fibrillation ([place])   . BPH (benign prostatic hypertrophy)   . CAD (coronary artery disease)    Dr. Wynonia Lawman  . Cataract   . COVID-19   . Depression 2004  . Diverticulosis   . GERD (gastroesophageal reflux disease)   . History of TIAs   . Hyperlipidemia   . Hypertension   . Hypertensive heart disease without CHF   . Hypothyroidism   . Internal hemorrhoids   . Lumbar disc disease   . Oral bleeding 08/20/2012   Following molar tooth extraction July, 2013  . Ruptured disk 1995  . Shingles   . Thyroid disease   . Vitamin D deficiency   . Vitreous detachment     Past Surgical History:  Procedure Laterality Date  . bilateral cataract surgery  6/07  . CARDIAC CATHETERIZATION    . CORONARY ARTERY BYPASS GRAFT  12/99    Dr. Servando Snare   . cysloscopy  8/08  . EYE SURGERY    . HERNIA REPAIR  1997   right  . herninated disc  1995  . PROSTATE BIOPSY  8/08  . TONSILLECTOMY AND ADENOIDECTOMY      There were no vitals filed for this visit.  Subjective Assessment - 01/28/20 1314    Subjective  COVID-19 screen performed prior to patient entering clinic.  Much better.    Pertinent History  Hernia repair, OA, DDD, CABG, hypothyroidism, H/o TIA's, HTN.    Patient Stated Goals  Get out of pain.    Currently in Pain?  Yes    Pain Location  Neck    Pain Orientation  Right;Left    Pain Descriptors / Indicators  Discomfort    Pain Onset  1 to 4 weeks ago                       [facility] Adult PT Treatment/Exercise - 01/28/20 0001      Modalities   Modalities  Electrical Stimulation;Moist Heat;Ultrasound      Moist Heat Therapy   Number Minutes Moist Heat  20 Minutes    Moist Heat Location  --   Left cervical.     Electrical Stimulation   Electrical Stimulation Location  --   Left cervical.   Electrical Stimulation Action  Modulated.    Electrical Stimulation Parameters  80-150 Hz at 100% scan x 20 minutes.    Electrical Stimulation Goals  Tone;Pain      Ultrasound   Ultrasound Location  Left UT    Ultrasound  Parameters  Combo e'stim/U/S at 1.50 W/CM2 x 12 minutes.    Ultrasound Goals  Pain      Manual Therapy   Manual Therapy  Soft tissue mobilization    Soft tissue mobilization  STW/M x 12 minutes to patient's left UT and levator scapulae to decrease tone.               PT Short Term Goals - 01/21/20 1211      PT SHORT TERM GOAL #1   Title  Ind with a HEP.    Time  2    Period  Weeks    Status  On-going        PT Long Term Goals - 01/21/20 1211      PT LONG TERM GOAL #1   Title  Increase active cervical rotation to 65 degrees+ so patient can turn head more easily while driving.    Time  6    Period  Weeks    Status  On-going      PT LONG TERM GOAL #2   Title  Perform ADL's with pain not > 3/10.    Time  6    Period  Weeks    Status  On-going            Plan - 01/28/20 1313    Clinical Impression Statement  Patient more localized now to left UT and levator scapulae.  Essentially no pain post-treatment.       Patient will benefit from skilled therapeutic intervention in order to improve the  following deficits and impairments:     Visit Diagnosis: Cervicalgia  Abnormal posture     Problem List Patient Active Problem List   Diagnosis Date Noted  . Acute respiratory failure with hypoxia ([place]) 09/16/2019  . Osteoarthritis of spine with radiculopathy, lumbar region 01/08/2018  . Thyroid disease   . Aortic atherosclerosis ([place]) 05/02/2017  . Thrombocytopenia ([place]) 07/10/2016  . Hereditary and idiopathic peripheral neuropathy 10/16/2015  . Mononeuropathy 10/04/2015  . Allergic rhinitis 05/06/2014  . Depression 08/03/2013  . Sick sinus syndrome ([place]) 07/31/2012  . Hypertensive heart disease without CHF   . Chronic atrial fibrillation ([place])   . Coronary artery disease involving native coronary artery of native heart with angina pectoris ([place])   . History of TIAs   . Lumbar disc disease   . GERD (gastroesophageal reflux disease)   . Chronic anticoagulation   . Benign prostatic hyperplasia   . Hyperlipidemia     Sly Parlee, [place] MPT 01/28/2020, 2:16 PM  [facility] [address] [place], [place], [address] Phone: [phone]   Fax:  [phone]  Name: Damin Salido MRN: [id number] Date of Birth: 1929/12/16

## 2020-01-28 NOTE — Telephone Encounter (Signed)
Mitzi spoke with pt this morning and he decided to reschedule.

## 2020-02-02 ENCOUNTER — Ambulatory Visit: Payer: Medicare Other | Admitting: Physical Therapy

## 2020-02-02 ENCOUNTER — Other Ambulatory Visit: Payer: Self-pay

## 2020-02-02 DIAGNOSIS — R293 Abnormal posture: Secondary | ICD-10-CM

## 2020-02-02 DIAGNOSIS — M542 Cervicalgia: Secondary | ICD-10-CM | POA: Diagnosis not present

## 2020-02-02 NOTE — Therapy (Signed)
[facility] [place], [place], [address] Phone: [phone]   Fax:  [phone]  Physical Therapy Treatment  Patient Details  Name: Dalton Vaughan MRN: [id number] Date of Birth: 09-15-1929 Referring Provider (PT): Darla Lesches   Encounter Date: 02/02/2020  PT End of Session - 02/02/20 1111    Visit Number  8    Number of Visits  12    Date for PT Re-Evaluation  04/11/20    Authorization Type  FOTO AT LEAST EVERY 5TH VISIT.  PROGRESS NOTE AT 10TH VISIT.  KX MODIFIER AFTER 15 VISITS.    PT Start Time  1030    PT Stop Time  1122    PT Time Calculation (min)  52 min    Activity Tolerance  Patient tolerated treatment well    Behavior During Therapy  WFL for tasks assessed/performed       Past Medical History:  Diagnosis Date  . Anal fissure   . Atrial fibrillation ([place])   . BPH (benign prostatic hypertrophy)   . CAD (coronary artery disease)    Dr. Wynonia Lawman  . Cataract   . COVID-19   . Depression 2004  . Diverticulosis   . GERD (gastroesophageal reflux disease)   . History of TIAs   . Hyperlipidemia   . Hypertension   . Hypertensive heart disease without CHF   . Hypothyroidism   . Internal hemorrhoids   . Lumbar disc disease   . Oral bleeding 08/20/2012   Following molar tooth extraction July, 2013  . Ruptured disk 1995  . Shingles   . Thyroid disease   . Vitamin D deficiency   . Vitreous detachment     Past Surgical History:  Procedure Laterality Date  . bilateral cataract surgery  6/07  . CARDIAC CATHETERIZATION    . CORONARY ARTERY BYPASS GRAFT  12/99    Dr. Servando Snare   . cysloscopy  8/08  . EYE SURGERY    . HERNIA REPAIR  1997   right  . herninated disc  1995  . PROSTATE BIOPSY  8/08  . TONSILLECTOMY AND ADENOIDECTOMY      There were no vitals filed for this visit.  Subjective Assessment - 02/02/20 1104    Subjective  COVID-19 screen performed prior to patient entering clinic.  neck bothered me over  the weekend.  Maybe weather related.  Better today.    Pertinent History  Hernia repair, OA, DDD, CABG, hypothyroidism, H/o TIA's, HTN.    Patient Stated Goals  Get out of pain.    Currently in Pain?  Yes                       [facility] Adult PT Treatment/Exercise - 02/02/20 0001      Modalities   Modalities  Electrical Stimulation;Moist Heat      Moist Heat Therapy   Number Minutes Moist Heat  20 Minutes    Moist Heat Location  --   Left cervical.     Electrical Stimulation   Electrical Stimulation Location  --   Left cervical.   Electrical Stimulation Action  IFC    Electrical Stimulation Parameters  80-150 Hz x 20 minutes at 40% scan.    Electrical Stimulation Goals  Tone;Pain      Ultrasound   Ultrasound Location  Left cervical.    Ultrasound Parameters  Combo e'stim/U/S at 1.50 W/CM2 x 12 minutes.    Ultrasound Goals  Pain  Manual Therapy   Manual Therapy  Soft tissue mobilization    Soft tissue mobilization  STW/M x 12 minutes.               PT Short Term Goals - 01/21/20 1211      PT SHORT TERM GOAL #1   Title  Ind with a HEP.    Time  2    Period  Weeks    Status  On-going        PT Long Term Goals - 01/21/20 1211      PT LONG TERM GOAL #1   Title  Increase active cervical rotation to 65 degrees+ so patient can turn head more easily while driving.    Time  6    Period  Weeks    Status  On-going      PT LONG TERM GOAL #2   Title  Perform ADL's with pain not > 3/10.    Time  6    Period  Weeks    Status  On-going            Plan - 02/02/20 1108    Clinical Impression Statement  The patient c/o a rise of pain over the weekend which he feels may have been due to cold and rainly weather.  The pain decreased today.  We reviewed chin tucks and extension today.  Recommeded he do them muc frequently f more than he is now.    Personal Factors and Comorbidities  Comorbidity 1;Comorbidity 2;Comorbidity 3+    Comorbidities   Hernia repair, OA, DDD, CABG, hypothyroidism, H/o TIA's, HTN.    Examination-Activity Limitations  Other    Stability/Clinical Decision Making  Evolving/Moderate complexity    Rehab Potential  Excellent    PT Frequency  2x / week    PT Duration  6 weeks    PT Treatment/Interventions  ADLs/Self Care Home Management;Cryotherapy;Electrical Stimulation;Ultrasound;Moist Heat;Therapeutic activities;Therapeutic exercise;Patient/family education;Passive range of motion    PT Next Visit Plan  cont with POC for STW/M and suboccipital release technqiue, Combo e'stim/U/S.  Chin tucks and retraction.    Consulted and Agree with Plan of Care  Patient       Patient will benefit from skilled therapeutic intervention in order to improve the following deficits and impairments:  Pain, Decreased activity tolerance, Decreased range of motion, Postural dysfunction  Visit Diagnosis: Cervicalgia  Abnormal posture     Problem List Patient Active Problem List   Diagnosis Date Noted  . Acute respiratory failure with hypoxia ([place]) 09/16/2019  . Osteoarthritis of spine with radiculopathy, lumbar region 01/08/2018  . Thyroid disease   . Aortic atherosclerosis ([place]) 05/02/2017  . Thrombocytopenia ([place]) 07/10/2016  . Hereditary and idiopathic peripheral neuropathy 10/16/2015  . Mononeuropathy 10/04/2015  . Allergic rhinitis 05/06/2014  . Depression 08/03/2013  . Sick sinus syndrome ([place]) 07/31/2012  . Hypertensive heart disease without CHF   . Chronic atrial fibrillation ([place])   . Coronary artery disease involving native coronary artery of native heart with angina pectoris ([place])   . History of TIAs   . Lumbar disc disease   . GERD (gastroesophageal reflux disease)   . Chronic anticoagulation   . Benign prostatic hyperplasia   . Hyperlipidemia     Dalton Vaughan, Dalton Vaughan 02/02/2020, 11:24 AM  [facility] [address] [place], [place], [address] Phone:  [phone]   Fax:  [phone]  Name: Dalton Vaughan MRN: [id number] Date of Birth: [birth year]

## 2020-02-04 ENCOUNTER — Encounter: Payer: Medicare Other | Admitting: Physical Therapy

## 2020-02-05 ENCOUNTER — Ambulatory Visit: Payer: Medicare Other | Admitting: Family Medicine

## 2020-02-09 ENCOUNTER — Ambulatory Visit: Payer: Medicare Other | Admitting: Physical Therapy

## 2020-02-09 ENCOUNTER — Other Ambulatory Visit: Payer: Self-pay

## 2020-02-09 DIAGNOSIS — R293 Abnormal posture: Secondary | ICD-10-CM

## 2020-02-09 DIAGNOSIS — M542 Cervicalgia: Secondary | ICD-10-CM | POA: Diagnosis not present

## 2020-02-09 NOTE — Therapy (Signed)
[facility] [place], [place], [address] Phone: [phone]   Fax:  [phone]  Physical Therapy Treatment  Patient Details  Name: Dalton Vaughan MRN: [id number] Date of Birth: 1929-12-01 Referring Provider (PT): Darla Lesches   Encounter Date: 02/09/2020  PT End of Session - 02/09/20 1106    Visit Number  8    Number of Visits  12    Date for PT Re-Evaluation  04/11/20    Authorization Type  FOTO AT LEAST EVERY 5TH VISIT.  PROGRESS NOTE AT 10TH VISIT.  KX MODIFIER AFTER 15 VISITS.    PT Start Time  1030    PT Stop Time  1124    PT Time Calculation (min)  54 min    Activity Tolerance  Patient tolerated treatment well    Behavior During Therapy  WFL for tasks assessed/performed       Past Medical History:  Diagnosis Date  . Anal fissure   . Atrial fibrillation ([place])   . BPH (benign prostatic hypertrophy)   . CAD (coronary artery disease)    Dr. Wynonia Lawman  . Cataract   . COVID-19   . Depression 2004  . Diverticulosis   . GERD (gastroesophageal reflux disease)   . History of TIAs   . Hyperlipidemia   . Hypertension   . Hypertensive heart disease without CHF   . Hypothyroidism   . Internal hemorrhoids   . Lumbar disc disease   . Oral bleeding 08/20/2012   Following molar tooth extraction July, 2013  . Ruptured disk 1995  . Shingles   . Thyroid disease   . Vitamin D deficiency   . Vitreous detachment     Past Surgical History:  Procedure Laterality Date  . bilateral cataract surgery  6/07  . CARDIAC CATHETERIZATION    . CORONARY ARTERY BYPASS GRAFT  12/99    Dr. Servando Snare   . cysloscopy  8/08  . EYE SURGERY    . HERNIA REPAIR  1997   right  . herninated disc  1995  . PROSTATE BIOPSY  8/08  . TONSILLECTOMY AND ADENOIDECTOMY      There were no vitals filed for this visit.  Subjective Assessment - 02/09/20 1106    Subjective  COVID-19 screen performed prior to patient entering clinic.  Nhas felt much better  the last couple of days.    Pertinent History  Hernia repair, OA, DDD, CABG, hypothyroidism, H/o TIA's, HTN.    Patient Stated Goals  Get out of pain.    Currently in Pain?  Yes    Pain Score  2     Pain Location  Neck    Pain Orientation  Left    Pain Descriptors / Indicators  Discomfort    Pain Type  Acute pain    Pain Onset  1 to 4 weeks ago                       [facility] Adult PT Treatment/Exercise - 02/09/20 0001      Modalities   Modalities  Electrical Stimulation;Moist Heat;Ultrasound      Moist Heat Therapy   Number Minutes Moist Heat  20 Minutes    Moist Heat Location  --   LT UT     Electrical Stimulation   Electrical Stimulation Location  --   LT UT   Electrical Stimulation Action  Modulated    Electrical Stimulation Parameters  80-150 Hz x 20 minutes.    Dealer  Stimulation Goals  Pain      Ultrasound   Ultrasound Location  LT cervical.    Ultrasound Parameters  Combo e'stim/U/S at 1.50 W/CM2 x 12 minutes.    Ultrasound Goals  Pain      Manual Therapy   Manual Therapy  Soft tissue mobilization    Soft tissue mobilization  STW/M x 11 minutes to left UT/lev scap and mid-cervical paraspianl musculature.               PT Short Term Goals - 01/21/20 1211      PT SHORT TERM GOAL #1   Title  Ind with a HEP.    Time  2    Period  Weeks    Status  On-going        PT Long Term Goals - 01/21/20 1211      PT LONG TERM GOAL #1   Title  Increase active cervical rotation to 65 degrees+ so patient can turn head more easily while driving.    Time  6    Period  Weeks    Status  On-going      PT LONG TERM GOAL #2   Title  Perform ADL's with pain not > 3/10.    Time  6    Period  Weeks    Status  On-going            Plan - 02/09/20 1143    Clinical Impression Statement  Great job with nopain reported after treatment.    Personal Factors and Comorbidities  Comorbidity 1;Comorbidity 2;Comorbidity 3+    Comorbidities  Hernia  repair, OA, DDD, CABG, hypothyroidism, H/o TIA's, HTN.    Examination-Activity Limitations  Other    Examination-Participation Restrictions  Other    Stability/Clinical Decision Making  Evolving/Moderate complexity    Rehab Potential  Excellent    PT Frequency  2x / week    PT Duration  6 weeks    PT Treatment/Interventions  ADLs/Self Care Home Management;Cryotherapy;Electrical Stimulation;Ultrasound;Moist Heat;Therapeutic activities;Therapeutic exercise;Patient/family education;Passive range of motion    PT Next Visit Plan  cont with POC for STW/M and suboccipital release technqiue, Combo e'stim/U/S.  Chin tucks and retraction.    Consulted and Agree with Plan of Care  Patient       Patient will benefit from skilled therapeutic intervention in order to improve the following deficits and impairments:  Pain, Decreased activity tolerance, Decreased range of motion, Postural dysfunction  Visit Diagnosis: Cervicalgia  Abnormal posture     Problem List Patient Active Problem List   Diagnosis Date Noted  . Acute respiratory failure with hypoxia ([place]) 09/16/2019  . Osteoarthritis of spine with radiculopathy, lumbar region 01/08/2018  . Thyroid disease   . Aortic atherosclerosis ([place]) 05/02/2017  . Thrombocytopenia ([place]) 07/10/2016  . Hereditary and idiopathic peripheral neuropathy 10/16/2015  . Mononeuropathy 10/04/2015  . Allergic rhinitis 05/06/2014  . Depression 08/03/2013  . Sick sinus syndrome ([place]) 07/31/2012  . Hypertensive heart disease without CHF   . Chronic atrial fibrillation ([place])   . Coronary artery disease involving native coronary artery of native heart with angina pectoris ([place])   . History of TIAs   . Lumbar disc disease   . GERD (gastroesophageal reflux disease)   . Chronic anticoagulation   . Benign prostatic hyperplasia   . Hyperlipidemia     Anjani Feuerborn, [place] MPT 02/09/2020, 11:43 AM  [facility] [place], [place], [address] Phone: [phone]   Fax:  [phone]  Name: Dalton Vaughan MRN: [id number] Date of Birth: 1928/12/21

## 2020-02-11 ENCOUNTER — Ambulatory Visit: Payer: Medicare Other | Admitting: Physical Therapy

## 2020-02-11 ENCOUNTER — Other Ambulatory Visit: Payer: Self-pay

## 2020-02-11 DIAGNOSIS — M542 Cervicalgia: Secondary | ICD-10-CM | POA: Diagnosis not present

## 2020-02-11 DIAGNOSIS — R293 Abnormal posture: Secondary | ICD-10-CM

## 2020-02-11 NOTE — Therapy (Signed)
[facility] [place], [place], [address] Phone: [phone]   Fax:  [phone]  Physical Therapy Treatment  Patient Details  Name: Dalton Vaughan MRN: [id number] Date of Birth: Nov 01, 1929 Referring Provider (PT): Darla Lesches   Encounter Date: 02/11/2020  PT End of Session - 02/11/20 1210    Visit Number  9    Number of Visits  12    Date for PT Re-Evaluation  04/11/20    Authorization Type  FOTO AT LEAST EVERY 5TH VISIT.  PROGRESS NOTE AT 10TH VISIT.  KX MODIFIER AFTER 15 VISITS.    PT Start Time  1030    PT Stop Time  1122    PT Time Calculation (min)  52 min    Activity Tolerance  Patient tolerated treatment well    Behavior During Therapy  WFL for tasks assessed/performed       Past Medical History:  Diagnosis Date  . Anal fissure   . Atrial fibrillation ([place])   . BPH (benign prostatic hypertrophy)   . CAD (coronary artery disease)    Dr. Wynonia Lawman  . Cataract   . COVID-19   . Depression 2004  . Diverticulosis   . GERD (gastroesophageal reflux disease)   . History of TIAs   . Hyperlipidemia   . Hypertension   . Hypertensive heart disease without CHF   . Hypothyroidism   . Internal hemorrhoids   . Lumbar disc disease   . Oral bleeding 08/20/2012   Following molar tooth extraction July, 2013  . Ruptured disk 1995  . Shingles   . Thyroid disease   . Vitamin D deficiency   . Vitreous detachment     Past Surgical History:  Procedure Laterality Date  . bilateral cataract surgery  6/07  . CARDIAC CATHETERIZATION    . CORONARY ARTERY BYPASS GRAFT  12/99    Dr. Servando Snare   . cysloscopy  8/08  . EYE SURGERY    . HERNIA REPAIR  1997   right  . herninated disc  1995  . PROSTATE BIOPSY  8/08  . TONSILLECTOMY AND ADENOIDECTOMY      There were no vitals filed for this visit.  Subjective Assessment - 02/11/20 1107    Subjective  COVID-19 screen performed prior to patient entering clinic.  I'm way better.    Pertinent History  Hernia repair, OA, DDD, CABG, hypothyroidism, H/o TIA's, HTN.    Patient Stated Goals  Get out of pain.    Currently in Pain?  Yes    Pain Score  2     Pain Location  Neck    Pain Orientation  Left    Pain Descriptors / Indicators  Discomfort    Pain Onset  1 to 4 weeks ago                       [facility] Adult PT Treatment/Exercise - 02/11/20 0001      Modalities   Modalities  Electrical Stimulation;Moist Heat;Ultrasound      Moist Heat Therapy   Number Minutes Moist Heat  20 Minutes    Moist Heat Location  --   Left C-spine.     Acupuncturist Location  --   Left C-spine.   Electrical Stimulation Action  Modulated.    Electrical Stimulation Parameters  80-150 Hz x 20 minutes.,    Electrical Stimulation Goals  Pain      Ultrasound   Ultrasound Location  Left cervical.    Ultrasound Parameters  Combo e'stim/u/S at 1.50 W/CM2 x 15 minutes.    Ultrasound Goals  Pain      Manual Therapy   Manual Therapy  Soft tissue mobilization    Soft tissue mobilization  STW/M x 8 minutes with trigger point release technique to left mid-cervical region.               PT Short Term Goals - 01/21/20 1211      PT SHORT TERM GOAL #1   Title  Ind with a HEP.    Time  2    Period  Weeks    Status  On-going        PT Long Term Goals - 01/21/20 1211      PT LONG TERM GOAL #1   Title  Increase active cervical rotation to 65 degrees+ so patient can turn head more easily while driving.    Time  6    Period  Weeks    Status  On-going      PT LONG TERM GOAL #2   Title  Perform ADL's with pain not > 3/10.    Time  6    Period  Weeks    Status  On-going            Plan - 02/11/20 1210    Clinical Impression Statement  Patient felling much better.  He reports a consistent reduction in his pain-level.  He states he is doing is HEP.  He had a left mid-cervical trigger point that responded well to STW/M.     Personal Factors and Comorbidities  Comorbidity 1;Comorbidity 2;Comorbidity 3+    Comorbidities  Hernia repair, OA, DDD, CABG, hypothyroidism, H/o TIA's, HTN.    Examination-Activity Limitations  Other    Examination-Participation Restrictions  Other    Stability/Clinical Decision Making  Evolving/Moderate complexity    Rehab Potential  Excellent    PT Frequency  2x / week    PT Duration  6 weeks    PT Treatment/Interventions  ADLs/Self Care Home Management;Cryotherapy;Electrical Stimulation;Ultrasound;Moist Heat;Therapeutic activities;Therapeutic exercise;Patient/family education;Passive range of motion    PT Next Visit Plan  cont with POC for STW/M and suboccipital release technqiue, Combo e'stim/U/S.  Chin tucks and retraction.    Consulted and Agree with Plan of Care  Patient       Patient will benefit from skilled therapeutic intervention in order to improve the following deficits and impairments:  Pain, Decreased activity tolerance, Decreased range of motion, Postural dysfunction  Visit Diagnosis: Cervicalgia  Abnormal posture     Problem List Patient Active Problem List   Diagnosis Date Noted  . Acute respiratory failure with hypoxia ([place]) 09/16/2019  . Osteoarthritis of spine with radiculopathy, lumbar region 01/08/2018  . Thyroid disease   . Aortic atherosclerosis ([place]) 05/02/2017  . Thrombocytopenia ([place]) 07/10/2016  . Hereditary and idiopathic peripheral neuropathy 10/16/2015  . Mononeuropathy 10/04/2015  . Allergic rhinitis 05/06/2014  . Depression 08/03/2013  . Sick sinus syndrome ([place]) 07/31/2012  . Hypertensive heart disease without CHF   . Chronic atrial fibrillation ([place])   . Coronary artery disease involving native coronary artery of native heart with angina pectoris ([place])   . History of TIAs   . Lumbar disc disease   . GERD (gastroesophageal reflux disease)   . Chronic anticoagulation   . Benign prostatic hyperplasia   . Hyperlipidemia     Auna Mikkelsen,  [place] MPT 02/11/2020, 12:21 PM  [facility]  [place], [place], [address] Phone: [phone]   Fax:  [phone]  Name: Keean Wilmeth MRN: [id number] Date of Birth: 06/09/1929

## 2020-02-12 ENCOUNTER — Ambulatory Visit (INDEPENDENT_AMBULATORY_CARE_PROVIDER_SITE_OTHER): Payer: Medicare Other | Admitting: Family Medicine

## 2020-02-12 ENCOUNTER — Encounter: Payer: Self-pay | Admitting: Family Medicine

## 2020-02-12 VITALS — BP 160/74 | HR 64 | Temp 98.4°F | Ht 72.0 in | Wt 186.0 lb

## 2020-02-12 DIAGNOSIS — Z7901 Long term (current) use of anticoagulants: Secondary | ICD-10-CM | POA: Diagnosis not present

## 2020-02-12 DIAGNOSIS — I482 Chronic atrial fibrillation, unspecified: Secondary | ICD-10-CM

## 2020-02-12 DIAGNOSIS — R7989 Other specified abnormal findings of blood chemistry: Secondary | ICD-10-CM

## 2020-02-12 DIAGNOSIS — T148XXA Other injury of unspecified body region, initial encounter: Secondary | ICD-10-CM

## 2020-02-12 DIAGNOSIS — S50911A Unspecified superficial injury of right forearm, initial encounter: Secondary | ICD-10-CM

## 2020-02-12 LAB — POCT INR: INR: 3.1 — AB (ref 2–3)

## 2020-02-12 LAB — COAGUCHEK XS/INR WAIVED
INR: 3.1 — ABNORMAL HIGH (ref 0.9–1.1)
Prothrombin Time: 36.9 s

## 2020-02-12 NOTE — Progress Notes (Signed)
Subjective: CC: f/u afib PCP: [name], DO HPI:Dalton Vaughan is a 86 y.o. male presenting to clinic today for:  1.  Atrial fibrillation Allergic reaction to Eliquis in past.  Did home INR but a hard stick so comes to office now.  INR was therapeutic at last visit at 2.2.  He continues to take half tablet of Coumadin Mondays and Fridays and 1 tablet daily all other days.  He does report recently he was fiddling with a fireplace and subsequently developed a large bruise on his right forearm.  He has a small knot that he thinks is a hematoma as well.  The symptoms are getting better but just wanted to make me aware of this.  Denies any hematochezia, hematuria.  Occasionally has right-sided epistaxis that is chronic for him with blowing his nose.  No prolonged bleeds.   ROS: Per HPI  Allergies  Allergen Reactions  . Paxil [Paroxetine Hcl] Palpitations  . Eliquis [Apixaban] Other (See Comments)    dizziness  . Penicillins Other (See Comments)    Did it involve swelling of the face/tongue/throat, SOB, or low BP? Unknown Did it involve sudden or severe rash/hives, skin peeling, or any reaction on the inside of your mouth or nose? Unknown Did you need to seek medical attention at a hospital or doctor's office? Unknown When did it last happen?unk If all above answers are "NO", may proceed with cephalosporin use.   Christy Sartorius Sodium] Other (See Comments)    myalgias  . Zetia [Ezetimibe] Other (See Comments)    myalgias  . Vytorin [Ezetimibe-Simvastatin] Other (See Comments)    myalgias   Past Medical History:  Diagnosis Date  . Anal fissure   . Atrial fibrillation ([place])   . BPH (benign prostatic hypertrophy)   . CAD (coronary artery disease)    Dr. Wynonia Lawman  . Cataract   . COVID-19   . Depression 2004  . Diverticulosis   . GERD (gastroesophageal reflux disease)   . History of TIAs   . Hyperlipidemia   . Hypertension   . Hypertensive heart disease  without CHF   . Hypothyroidism   . Internal hemorrhoids   . Lumbar disc disease   . Oral bleeding 08/20/2012   Following molar tooth extraction July, 2013  . Ruptured disk 1995  . Shingles   . Thyroid disease   . Vitamin D deficiency   . Vitreous detachment     Current Outpatient Medications:  .  amLODipine (NORVASC) 5 MG tablet, Take 1 tablet (5 mg total) by mouth daily. (Patient taking differently: Take 2.5 mg by mouth 2 (two) times daily. ), Disp: 90 tablet, Rfl: 3 .  Calcium Carb-Cholecalciferol (CALCIUM + D3) 600-200 MG-UNIT TABS, Take 1 tablet by mouth daily. , Disp: , Rfl:  .  Cholecalciferol (VITAMIN D-3) 5000 UNITS TABS, Take 1 tablet by mouth daily. , Disp: , Rfl:  .  doxazosin (CARDURA) 8 MG tablet, Take 1 tablet (8 mg total) by mouth at bedtime., Disp: 90 tablet, Rfl: 3 .  levothyroxine (SYNTHROID) 50 MCG tablet, Take 1 tablet (50 mcg total) by mouth daily. (Needs albwork), Disp: 90 tablet, Rfl: 0 .  metoprolol succinate (TOPROL-XL) 25 MG 24 hr tablet, Take by mouth., Disp: , Rfl:  .  mirtazapine (REMERON) 15 MG tablet, Take 1 tablet (15 mg total) by mouth at bedtime., Disp: 30 tablet, Rfl: 1 .  Multiple Vitamin (MULTIVITAMIN) tablet, Take 1 tablet by mouth daily.  , Disp: , Rfl:  .  olopatadine (PATANOL) 0.1 % ophthalmic solution, Place 1 drop into both eyes 2 (two) times daily. , Disp: , Rfl:  .  predniSONE (STERAPRED UNI-PAK 21 TAB) 10 MG (21) TBPK tablet, As directed x 6 days, Disp: 21 tablet, Rfl: 0 .  ramipril (ALTACE) 10 MG capsule, Take 1 capsule (10 mg total) by mouth daily., Disp: 90 capsule, Rfl: 1 .  rosuvastatin (CRESTOR) 5 MG tablet, Take 1 tablet (5 mg total) by mouth daily. as directed (Patient taking differently: Take 5 mg by mouth every Monday, Wednesday, and Friday. ), Disp: 90 tablet, Rfl: 3 .  triamterene-hydrochlorothiazide (MAXZIDE-25) 37.5-25 MG tablet, TAKE 1 TABLET DAILY, Disp: 90 tablet, Rfl: 3 .  vitamin C (ASCORBIC ACID) 500 MG tablet, Take 500 mg by  mouth daily. , Disp: , Rfl:  .  warfarin (COUMADIN) 5 MG tablet, TAKE AS DIRECTED, Disp: 90 tablet, Rfl: 3 .  XIIDRA 5 % SOLN, Apply 1 drop to eye daily. , Disp: , Rfl:  Social History   Socioeconomic History  . Marital status: Widowed    Spouse name: [name]   . Number of children: 1  . Years of education: 52  . Highest education level: Not on file  Occupational History  . Occupation: Retired    Comment: Social research officer, government x 27 years  . 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 Smoking status: Former Smoker    Packs/day: 1.00    Years: 18.00    Pack years: 18.00    Types: Cigarettes    Start date: 12/17/1948    Quit date: 12/17/1966    Years since quitting: 53.1  . Smokeless tobacco: Never Used  Substance and Sexual Activity  . Alcohol use: Yes    Alcohol/week: 1.0 standard drinks    Types: 1 Standard drinks or equivalent per week    Comment: Occasionally  . Drug use: No  . Sexual activity: Not on file  Other Topics Concern  . Not on file  Social History Narrative   Married.  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 Difficulty of Paying Living Expenses: Not on file  Food Insecurity:   . Worried About Charity fundraiser in the Last Year: Not on file  . Ran Out of Food in the Last Year: Not on file  Transportation Needs:   . Lack of Transportation (Medical): Not on file  . Lack of Transportation (Non-Medical): Not on file  Physical Activity:   . Days of Exercise per Week: Not on file  . Minutes of Exercise per Session: Not on file  Stress:   . Feeling of Stress : Not on file  Social Connections:   . Frequency of Communication with Friends and Family: Not on file  . Frequency of Social Gatherings with Friends and Family: Not on file  . Attends Religious Services: Not on file  .  Active Member of Clubs or Organizations: Not on file  . Attends Archivist Meetings: Not on file  . Marital Status: Not on file  Intimate Partner Violence:   . Fear of Current or Ex-Partner: Not on file  . Emotionally Abused: Not on file  . Physically Abused: Not on file  . Sexually Abused: Not on file  Family History  Problem Relation Age of Onset  . Dementia Mother   . Arthritis Mother   . Cancer Brother        prostate  . Asthma Sister   . Heart disease Paternal Aunt   . Heart disease Paternal Uncle   . Hypertension Maternal Grandmother   . CVA Maternal Grandmother   . CVA Paternal Grandmother   . Hepatitis C Son   . Arthritis Son   . Colon cancer Neg Hx   . Colon polyps Neg Hx   . Kidney disease Neg Hx   . Esophageal cancer Neg Hx   . Gallbladder disease Neg Hx   . Diabetes Neg Hx     Objective: Office vital signs reviewed. BP (!) 160/74   Pulse 64   Temp 98.4 F (36.9 C) (Temporal)   Ht 6' (1.829 m)   Wt 186 lb (84.4 kg)   SpO2 96%   BMI 25.23 kg/m   Physical Examination:  General: Awake, alert, well nourished, No acute distress HEENT: Normal no conjunctival pallor Cardio: Irregularly irregular, S1-S2 heard.  No murmurs. Pulm: Normal work of breathing on room air; clear to auscultation bilaterally.  Normal work of breathing on room air.  No wheezes, rhonchi or rales. MSK: Ambulates independently; cervical range of motion is much better than last time; palpable knot along the ventral forearm over the ulnar aspect. ?  Hematoma versus small muscle tendon rupture Skin: Large ecchymosis noted along the ulnar aspect of the right forearm.  This appears to be healing bruise  Assessment/ Plan: 86 y.o. male   1. Chronic atrial fibrillation (HCC) Supratherapeutic INR noted today at 3.1.  Given his large bruise, we will go ahead and backed down on his Coumadin to 1/2 tablet Monday Wednesday Friday with 1 tablet daily all other days.  He is aware of dose  change and will follow-up in 1 week for INR recheck. - CoaguChek XS/INR Waived - CBC - POCT INR  2. Chronic anticoagulation - CoaguChek XS/INR Waived - CBC - POCT INR  3. Elevated liver function tests - Hepatic Function Panel - POCT INR  4. Bruise Appears to be posttraumatic.  Uncertain as to whether or not the knot that was palpable is a hematoma versus a partial tendon rupture.  He seems to have full function of the forearm so I favor the former.  We will recheck in 1 week.  Additionally, blood pressure noted to be elevated today.  He had just taken his blood pressure medications prior to arrival to the office.  We will recheck in 1 week.   No orders of the defined types were placed in this encounter.  No orders of the defined types were placed in this encounter.    [name], DO [place] [phone]

## 2020-02-13 LAB — HEPATIC FUNCTION PANEL
ALT: 13 IU/L (ref 0–44)
AST: 21 IU/L (ref 0–40)
Albumin: 4 g/dL (ref 3.6–4.6)
Alkaline Phosphatase: 109 IU/L (ref 39–117)
Bilirubin Total: 0.5 mg/dL (ref 0.0–1.2)
Bilirubin, Direct: 0.16 mg/dL (ref 0.00–0.40)
Total Protein: 6.2 g/dL (ref 6.0–8.5)

## 2020-02-13 LAB — CBC
Hematocrit: 39.9 % (ref 37.5–51.0)
Hemoglobin: 12.9 g/dL — ABNORMAL LOW (ref 13.0–17.7)
MCH: 27.7 pg (ref 26.6–33.0)
MCHC: 32.3 g/dL (ref 31.5–35.7)
MCV: 86 fL (ref 79–97)
Platelets: 182 10*3/uL (ref 150–450)
RBC: 4.65 x10E6/uL (ref 4.14–5.80)
RDW: 14.8 % (ref 11.6–15.4)
WBC: 5.8 10*3/uL (ref 3.4–10.8)

## 2020-02-16 ENCOUNTER — Encounter: Payer: Self-pay | Admitting: Physical Therapy

## 2020-02-16 ENCOUNTER — Ambulatory Visit: Payer: Medicare Other | Attending: Family Medicine | Admitting: Physical Therapy

## 2020-02-16 ENCOUNTER — Other Ambulatory Visit: Payer: Self-pay | Admitting: Family Medicine

## 2020-02-16 ENCOUNTER — Other Ambulatory Visit: Payer: Self-pay

## 2020-02-16 DIAGNOSIS — M542 Cervicalgia: Secondary | ICD-10-CM | POA: Diagnosis present

## 2020-02-16 DIAGNOSIS — R293 Abnormal posture: Secondary | ICD-10-CM | POA: Diagnosis present

## 2020-02-16 NOTE — Therapy (Addendum)
[facility] [place], [place], [address] Phone: [phone]   Fax:  [phone]  Physical Therapy Treatment  Patient Details  Name: Dalton Vaughan MRN: [id number] Date of Birth: April 08, 1929 Referring Provider (PT): Darla Lesches   Encounter Date: 02/16/2020  PT End of Session - 02/16/20 0949    Visit Number  10    Number of Visits  12    Date for PT Re-Evaluation  04/11/20    Authorization Type  FOTO AT LEAST EVERY 5TH VISIT.  PROGRESS NOTE AT 10TH VISIT.  KX MODIFIER AFTER 15 VISITS.    PT Start Time  [phone]    PT Stop Time  1032    PT Time Calculation (min)  42 min    Activity Tolerance  Patient tolerated treatment well    Behavior During Therapy  WFL for tasks assessed/performed       Past Medical History:  Diagnosis Date  . Anal fissure   . Atrial fibrillation ([place])   . BPH (benign prostatic hypertrophy)   . CAD (coronary artery disease)    Dr. Wynonia Lawman  . Cataract   . COVID-19   . Depression 2004  . Diverticulosis   . GERD (gastroesophageal reflux disease)   . History of TIAs   . Hyperlipidemia   . Hypertension   . Hypertensive heart disease without CHF   . Hypothyroidism   . Internal hemorrhoids   . Lumbar disc disease   . Oral bleeding 08/20/2012   Following molar tooth extraction July, 2013  . Ruptured disk 1995  . Shingles   . Thyroid disease   . Vitamin D deficiency   . Vitreous detachment     Past Surgical History:  Procedure Laterality Date  . bilateral cataract surgery  6/07  . CARDIAC CATHETERIZATION    . CORONARY ARTERY BYPASS GRAFT  12/99    Dr. Servando Snare   . cysloscopy  8/08  . EYE SURGERY    . HERNIA REPAIR  1997   right  . herninated disc  1995  . PROSTATE BIOPSY  8/08  . TONSILLECTOMY AND ADENOIDECTOMY      There were no vitals filed for this visit.  Subjective Assessment - 02/16/20 0948    Subjective  COVID-19 screen performed prior to patient entering clinic.  Better but still has  some pain with cervical rotation. Pain is primarily right on L side but today is on right side.    Pertinent History  Hernia repair, OA, DDD, CABG, hypothyroidism, H/o TIA's, HTN.    Patient Stated Goals  Get out of pain.    Currently in Pain?  Yes    Pain Score  2     Pain Location  Neck    Pain Orientation  Left    Pain Descriptors / Indicators  Discomfort;Tightness    Pain Type  Acute pain    Pain Onset  More than a month ago    Pain Frequency  Intermittent         [facility] PT Assessment - 02/16/20 0001      Assessment   Medical Diagnosis  Neck pain.    Referring Provider (PT)  Darla Lesches    Onset Date/Surgical Date  12/28/19      Restrictions   Weight Bearing Restrictions  No      ROM / Strength   AROM / PROM / Strength  AROM      AROM   Overall AROM   Within functional limits for tasks  performed    AROM Assessment Site  Cervical    Cervical - Right Rotation  65    Cervical - Left Rotation  62                   [facility] Adult PT Treatment/Exercise - 02/16/20 0001      Modalities   Modalities  Electrical Stimulation;Moist Heat;Ultrasound      Moist Heat Therapy   Number Minutes Moist Heat  15 Minutes    Moist Heat Location  Cervical      Electrical Stimulation   Electrical Stimulation Location  R UT    Electrical Stimulation Action  Pre-mod    Electrical Stimulation Parameters  80-150 hz x15 min    Electrical Stimulation Goals  Pain;Tone      Ultrasound   Ultrasound Location  R UT/cervical parspinals    Ultrasound Parameters  Combo with sym. biphasic    Ultrasound Goals  Pain      Manual Therapy   Manual Therapy  Soft tissue mobilization    Soft tissue mobilization  STW to R cervical paraspinals, UT to reduce tone and pain               PT Short Term Goals - 01/21/20 1211      PT SHORT TERM GOAL #1   Title  Ind with a HEP.    Time  2    Period  Weeks    Status  On-going        PT Long Term Goals - 02/16/20 1038      PT LONG  TERM GOAL #1   Title  Increase active cervical rotation to 65 degrees+ so patient can turn head more easily while driving.    Time  6    Period  Weeks    Status  Partially Met      PT LONG TERM GOAL #2   Title  Perform ADL's with pain not > 3/10.    Time  6    Period  Weeks    Status  Achieved            Plan - 02/16/20 1038    Clinical Impression Statement  Patient presented in clinic with much less cervical pain and seeing some improvement. Patient able to demonstrate improved B cervical rotation and ADLs pain scale 3/10 or less. More tone palpable in R UT and cervical paraspinals today. Normal modalities response noted following removal of the modalities.    Personal Factors and Comorbidities  Comorbidity 1;Comorbidity 2;Comorbidity 3+    Comorbidities  Hernia repair, OA, DDD, CABG, hypothyroidism, H/o TIA's, HTN.    Examination-Activity Limitations  Other    Examination-Participation Restrictions  Other    Stability/Clinical Decision Making  Evolving/Moderate complexity    Rehab Potential  Excellent    PT Frequency  2x / week    PT Duration  6 weeks    PT Treatment/Interventions  ADLs/Self Care Home Management;Cryotherapy;Electrical Stimulation;Ultrasound;Moist Heat;Therapeutic activities;Therapeutic exercise;Patient/family education;Passive range of motion    PT Next Visit Plan  cont with POC for STW/M and suboccipital release technqiue, Combo e'stim/U/S.  Chin tucks and retraction.    Consulted and Agree with Plan of Care  Patient       Patient will benefit from skilled therapeutic intervention in order to improve the following deficits and impairments:  Pain, Decreased activity tolerance, Decreased range of motion, Postural dysfunction  Visit Diagnosis: Cervicalgia  Abnormal posture     Problem List Patient Active  Problem List   Diagnosis Date Noted  . Acute respiratory failure with hypoxia ([place]) 09/16/2019  . Osteoarthritis of spine with radiculopathy, lumbar  region 01/08/2018  . Thyroid disease   . Aortic atherosclerosis ([place]) 05/02/2017  . Thrombocytopenia ([place]) 07/10/2016  . Hereditary and idiopathic peripheral neuropathy 10/16/2015  . Mononeuropathy 10/04/2015  . Allergic rhinitis 05/06/2014  . Depression 08/03/2013  . Sick sinus syndrome ([place]) 07/31/2012  . Hypertensive heart disease without CHF   . Chronic atrial fibrillation ([place])   . Coronary artery disease involving native coronary artery of native heart with angina pectoris ([place])   . History of TIAs   . Lumbar disc disease   . GERD (gastroesophageal reflux disease)   . Chronic anticoagulation   . Benign prostatic hyperplasia   . Hyperlipidemia     Standley Brooking, PTA 02/16/2020, 10:44 AM  [facility] [address] [place], [place], [address] Phone: [phone]   Fax:  [phone]  Name: Dalton Vaughan MRN: [id number] Date of Birth: [birth year]  Progress Note Reporting Period 01/12/20 to 02/16/20  See note below for Objective Data and Assessment of Progress/Goals. Excellent progress with a consistent reduction in cervical pain.  LTG #2 met at this time.    Mali Applegate MPT

## 2020-02-18 ENCOUNTER — Other Ambulatory Visit: Payer: Self-pay

## 2020-02-18 ENCOUNTER — Ambulatory Visit: Payer: Medicare Other | Admitting: Physical Therapy

## 2020-02-18 ENCOUNTER — Encounter: Payer: Self-pay | Admitting: Physical Therapy

## 2020-02-18 DIAGNOSIS — M542 Cervicalgia: Secondary | ICD-10-CM

## 2020-02-18 DIAGNOSIS — R293 Abnormal posture: Secondary | ICD-10-CM

## 2020-02-18 NOTE — Therapy (Signed)
[facility] [place], [place], [address] Phone: [phone]   Fax:  [phone]  Physical Therapy Treatment  Patient Details  Name: Dalton Vaughan MRN: [id number] Date of Birth: [birth year] Referring Provider (PT): Darla Lesches   Encounter Date: 02/18/2020  PT End of Session - 02/18/20 1012    Visit Number  11    Number of Visits  12    Date for PT Re-Evaluation  04/11/20    Authorization Type  FOTO AT LEAST EVERY 5TH VISIT.  PROGRESS NOTE AT 10TH VISIT.  KX MODIFIER AFTER 15 VISITS.    PT Start Time  0945    PT Stop Time  1026    PT Time Calculation (min)  41 min    Activity Tolerance  Patient tolerated treatment well    Behavior During Therapy  WFL for tasks assessed/performed       Past Medical History:  Diagnosis Date  . Anal fissure   . Atrial fibrillation ([place])   . BPH (benign prostatic hypertrophy)   . CAD (coronary artery disease)    Dr. Wynonia Lawman  . Cataract   . COVID-19   . Depression 2004  . Diverticulosis   . GERD (gastroesophageal reflux disease)   . History of TIAs   . Hyperlipidemia   . Hypertension   . Hypertensive heart disease without CHF   . Hypothyroidism   . Internal hemorrhoids   . Lumbar disc disease   . Oral bleeding 08/20/2012   Following molar tooth extraction July, 2013  . Ruptured disk 1995  . Shingles   . Thyroid disease   . Vitamin D deficiency   . Vitreous detachment     Past Surgical History:  Procedure Laterality Date  . bilateral cataract surgery  6/07  . CARDIAC CATHETERIZATION    . CORONARY ARTERY BYPASS GRAFT  12/99    Dr. Servando Snare   . cysloscopy  8/08  . EYE SURGERY    . HERNIA REPAIR  1997   right  . herninated disc  1995  . PROSTATE BIOPSY  8/08  . TONSILLECTOMY AND ADENOIDECTOMY      There were no vitals filed for this visit.  Subjective Assessment - 02/18/20 0948    Subjective  COVID-19 screen performed prior to patient entering clinic.  A little stiff all  over yet neck at a 2/10 discomfort    Pertinent History  Hernia repair, OA, DDD, CABG, hypothyroidism, H/o TIA's, HTN.    Patient Stated Goals  Get out of pain.    Currently in Pain?  Yes    Pain Score  2     Pain Location  Neck    Pain Orientation  Left    Pain Descriptors / Indicators  Discomfort;Tightness    Pain Type  Acute pain    Pain Onset  More than a month ago    Pain Frequency  Intermittent    Aggravating Factors   movement    Pain Relieving Factors  rest                       [facility] Adult PT Treatment/Exercise - 02/18/20 0001      Moist Heat Therapy   Number Minutes Moist Heat  15 Minutes    Moist Heat Location  Cervical      Electrical Stimulation   Electrical Stimulation Location  LT UT    Electrical Stimulation Action  premod    Electrical Stimulation Parameters  80-150hz  x44mn  Electrical Stimulation Goals  Pain;Tone      Ultrasound   Ultrasound Location  left UT/cervical    Ultrasound Parameters  combo 1.5w/cm2/100%/14mz x148m      Manual Therapy   Manual Therapy  Soft tissue mobilization    Soft tissue mobilization  STW to LT cervical paraspinals, UT to reduce tone and pain               PT Short Term Goals - 01/21/20 1211      PT SHORT TERM GOAL #1   Title  Ind with a HEP.    Time  2    Period  Weeks    Status  On-going        PT Long Term Goals - 02/16/20 1038      PT LONG TERM GOAL #1   Title  Increase active cervical rotation to 65 degrees+ so patient can turn head more easily while driving.    Time  6    Period  Weeks    Status  Partially Met      PT LONG TERM GOAL #2   Title  Perform ADL's with pain not > 3/10.    Time  6    Period  Weeks    Status  Achieved            Plan - 02/18/20 1013    Clinical Impression Statement  Patient tolerated treatment well today. Patient feels overall improvement and able to turn neck with greater ease. Patient has some limitations with discomfort when turning into  rotation yet no more than 2/10. Patient may want to DC to HEP next treatment per reported. remaining goal ongoing.    Personal Factors and Comorbidities  Comorbidity 1;Comorbidity 2;Comorbidity 3+    Comorbidities  Hernia repair, OA, DDD, CABG, hypothyroidism, H/o TIA's, HTN.    Examination-Activity Limitations  Other    Stability/Clinical Decision Making  Evolving/Moderate complexity    Rehab Potential  Excellent    PT Frequency  2x / week    PT Duration  6 weeks    PT Treatment/Interventions  ADLs/Self Care Home Management;Cryotherapy;Electrical Stimulation;Ultrasound;Moist Heat;Therapeutic activities;Therapeutic exercise;Patient/family education;Passive range of motion    PT Next Visit Plan  cont and assess for DC or continue pending patient/PT       Patient will benefit from skilled therapeutic intervention in order to improve the following deficits and impairments:  Pain, Decreased activity tolerance, Decreased range of motion, Postural dysfunction  Visit Diagnosis: Cervicalgia  Abnormal posture     Problem List Patient Active Problem List   Diagnosis Date Noted  . Acute respiratory failure with hypoxia ([place]09/30/2020  . Osteoarthritis of spine with radiculopathy, lumbar region 01/08/2018  . Thyroid disease   . Aortic atherosclerosis ([place]CL[place]05/17/2018  . Thrombocytopenia ([place]CO[place]07/25/2017  . Hereditary and idiopathic peripheral neuropathy 10/16/2015  . Mononeuropathy 10/04/2015  . Allergic rhinitis 05/06/2014  . Depression 08/03/2013  . Sick sinus syndrome (HCCornucopia08/15/2013  . Hypertensive heart disease without CHF   . Chronic atrial fibrillation (HCAub[place]  . Coronary artery disease involving native coronary artery of native heart with angina pectoris ([place]CM[place]  . History of TIAs   . Lumbar disc disease   . GERD (gastroesophageal reflux disease)   . Chronic anticoagulation   . Benign prostatic hyperplasia   . Hyperlipidemia     Amena Dockham P, PTA 02/18/2020, 10:28  AM  [facility]: [phone]  Fax:  [phone]  Name: Hubbard Seldon MRN: [id number] Date of Birth: 1929-01-16

## 2020-02-19 ENCOUNTER — Ambulatory Visit (INDEPENDENT_AMBULATORY_CARE_PROVIDER_SITE_OTHER): Payer: Medicare Other | Admitting: Family Medicine

## 2020-02-19 ENCOUNTER — Encounter: Payer: Self-pay | Admitting: Family Medicine

## 2020-02-19 VITALS — BP 148/90 | HR 76 | Temp 98.1°F | Ht 72.0 in | Wt 186.2 lb

## 2020-02-19 DIAGNOSIS — Z7901 Long term (current) use of anticoagulants: Secondary | ICD-10-CM | POA: Diagnosis not present

## 2020-02-19 DIAGNOSIS — I482 Chronic atrial fibrillation, unspecified: Secondary | ICD-10-CM | POA: Diagnosis not present

## 2020-02-19 LAB — COAGUCHEK XS/INR WAIVED
INR: 2.1 — ABNORMAL HIGH (ref 0.9–1.1)
Prothrombin Time: 25.4 s

## 2020-02-19 LAB — POCT INR: INR: 2.1 (ref 2–3)

## 2020-02-19 NOTE — Progress Notes (Signed)
Subjective: CC: f/u afib PCP: [name], DO HPI:Dalton Vaughan is a 86 y.o. male presenting to clinic today for:  1.  Atrial fibrillation Allergic reaction to Eliquis in past.  Did home INR but a hard stick so comes to office now.  INR was therapeutic at last visit at 3.1.  He had a very large bruise and in the setting of supratherapeutic level, his coumadin was decreased to 1/2 tablet Monday, Wednesday Friday with 1 daily other days.  Notes that the bruise has gotten quite a bit better.  He still has a lump on his forearm but he feels that it is about the same size if not smaller.  Denies any pain at that area.  He still has full function of the right upper extremity.  Denies any heart palpitations, tachycardia, shortness of breath or change in exercise tolerance.  ROS: Per HPI  Allergies  Allergen Reactions  . Paxil [Paroxetine Hcl] Palpitations  . Eliquis [Apixaban] Other (See Comments)    dizziness  . Penicillins Other (See Comments)    Did it involve swelling of the face/tongue/throat, SOB, or low BP? Unknown Did it involve sudden or severe rash/hives, skin peeling, or any reaction on the inside of your mouth or nose? Unknown Did you need to seek medical attention at a hospital or doctor's office? Unknown When did it last happen?unk If all above answers are "NO", may proceed with cephalosporin use.   Christy Sartorius Sodium] Other (See Comments)    myalgias  . Zetia [Ezetimibe] Other (See Comments)    myalgias  . Vytorin [Ezetimibe-Simvastatin] Other (See Comments)    myalgias   Past Medical History:  Diagnosis Date  . Anal fissure   . Atrial fibrillation ([place])   . BPH (benign prostatic hypertrophy)   . CAD (coronary artery disease)    Dr. Wynonia Lawman  . Cataract   . COVID-19   . Depression 2004  . Diverticulosis   . GERD (gastroesophageal reflux disease)   . History of TIAs   . Hyperlipidemia   . Hypertension   . Hypertensive heart disease  without CHF   . Hypothyroidism   . Internal hemorrhoids   . Lumbar disc disease   . Oral bleeding 08/20/2012   Following molar tooth extraction July, 2013  . Ruptured disk 1995  . Shingles   . Thyroid disease   . Vitamin D deficiency   . Vitreous detachment     Current Outpatient Medications:  .  amLODipine (NORVASC) 5 MG tablet, Take 1 tablet (5 mg total) by mouth daily. (Patient taking differently: Take 2.5 mg by mouth 2 (two) times daily. ), Disp: 90 tablet, Rfl: 3 .  Calcium Carb-Cholecalciferol (CALCIUM + D3) 600-200 MG-UNIT TABS, Take 1 tablet by mouth daily. , Disp: , Rfl:  .  Cholecalciferol (VITAMIN D-3) 5000 UNITS TABS, Take 1 tablet by mouth daily. , Disp: , Rfl:  .  doxazosin (CARDURA) 8 MG tablet, Take 1 tablet (8 mg total) by mouth at bedtime., Disp: 90 tablet, Rfl: 3 .  levothyroxine (SYNTHROID) 50 MCG tablet, Take 1 tablet (50 mcg total) by mouth daily before breakfast., Disp: 30 tablet, Rfl: 0 .  metoprolol succinate (TOPROL-XL) 25 MG 24 hr tablet, Take by mouth., Disp: , Rfl:  .  mirtazapine (REMERON) 15 MG tablet, Take 1 tablet (15 mg total) by mouth at bedtime., Disp: 30 tablet, Rfl: 1 .  Multiple Vitamin (MULTIVITAMIN) tablet, Take 1 tablet by mouth daily.  , Disp: , Rfl:  .  olopatadine (PATANOL) 0.1 % ophthalmic solution, Place 1 drop into both eyes 2 (two) times daily. , Disp: , Rfl:  .  predniSONE (STERAPRED UNI-PAK 21 TAB) 10 MG (21) TBPK tablet, As directed x 6 days, Disp: 21 tablet, Rfl: 0 .  ramipril (ALTACE) 10 MG capsule, Take 1 capsule (10 mg total) by mouth daily., Disp: 90 capsule, Rfl: 1 .  rosuvastatin (CRESTOR) 5 MG tablet, Take 1 tablet (5 mg total) by mouth daily. as directed (Patient taking differently: Take 5 mg by mouth every Monday, Wednesday, and Friday. ), Disp: 90 tablet, Rfl: 3 .  triamterene-hydrochlorothiazide (MAXZIDE-25) 37.5-25 MG tablet, TAKE 1 TABLET DAILY, Disp: 90 tablet, Rfl: 3 .  vitamin C (ASCORBIC ACID) 500 MG tablet, Take 500 mg by  mouth daily. , Disp: , Rfl:  .  warfarin (COUMADIN) 5 MG tablet, TAKE AS DIRECTED, Disp: 90 tablet, Rfl: 3 .  XIIDRA 5 % SOLN, Apply 1 drop to eye daily. , Disp: , Rfl:  Social History   Socioeconomic History  . Marital status: Widowed    Spouse name: [name]   . Number of children: 1  . Years of education: 82  . Highest education level: Not on file  Occupational History  . Occupation: Retired    Comment: Social research officer, government x 27 years  . 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 Smoking status: Former Smoker    Packs/day: 1.00    Years: 18.00    Pack years: 18.00    Types: Cigarettes    Start date: 12/17/1948    Quit date: 12/17/1966    Years since quitting: 53.2  . Smokeless tobacco: Never Used  Substance and Sexual Activity  . Alcohol use: Yes    Alcohol/week: 1.0 standard drinks    Types: 1 Standard drinks or equivalent per week    Comment: Occasionally  . Drug use: No  . Sexual activity: Not on file  Other Topics Concern  . Not on file  Social History Narrative   Married.  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 Difficulty of Paying Living Expenses: Not on file  Food Insecurity:   . Worried About Charity fundraiser in the Last Year: Not on file  . Ran Out of Food in the Last Year: Not on file  Transportation Needs:   . Lack of Transportation (Medical): Not on file  . Lack of Transportation (Non-Medical): Not on file  Physical Activity:   . Days of Exercise per Week: Not on file  . Minutes of Exercise per Session: Not on file  Stress:   . Feeling of Stress : Not on file  Social Connections:   . Frequency of Communication with Friends and Family: Not on file  . Frequency of Social Gatherings with Friends and Family: Not on file  . Attends Religious Services: Not on file  .  Active Member of Clubs or Organizations: Not on file  . Attends Archivist Meetings: Not on file  . Marital Status: Not on file  Intimate Partner Violence:   . Fear of Current or Ex-Partner: Not on file  . Emotionally Abused: Not on file  . Physically Abused: Not on file  . Sexually Abused: Not on file  Family History  Problem Relation Age of Onset  . Dementia Mother   . Arthritis Mother   . Cancer Brother        prostate  . Asthma Sister   . Heart disease Paternal Aunt   . Heart disease Paternal Uncle   . Hypertension Maternal Grandmother   . CVA Maternal Grandmother   . CVA Paternal Grandmother   . Hepatitis C Son   . Arthritis Son   . Colon cancer Neg Hx   . Colon polyps Neg Hx   . Kidney disease Neg Hx   . Esophageal cancer Neg Hx   . Gallbladder disease Neg Hx   . Diabetes Neg Hx     Objective: Office vital signs reviewed. BP (!) 148/90 Comment: manual  Pulse 76   Temp 98.1 F (36.7 C) (Temporal)   Ht 6' (1.829 m)   Wt 186 lb 3.2 oz (84.5 kg)   SpO2 98%   BMI 25.25 kg/m   Physical Examination:  General: Awake, alert, well nourished, No acute distress HEENT: Normal no conjunctival pallor Cardio: Irregularly irregular, S1-S2 heard.  No murmurs. Pulm: Normal work of breathing on room air; clear to auscultation bilaterally.  Normal work of breathing on room air.  No wheezes, rhonchi or rales. Skin: resolving ecchymosis noted along the ulnar aspect of the right forearm.  Gumball sized rubbery mass persistent along the ventral aspect of the forearm on the ulnar side persistent.  Assessment/ Plan: 86 y.o. male   1. Chronic atrial fibrillation (HCC) INR therapeutic at 2.1.  Continue current regimen of 1/2 tablet Monday Wednesday Friday with 1 full tablet daily all other days.  Follow-up in 4 weeks.  We discussed that if the soft tissue abnormality is persistent may need to consider ultrasound.  He was amenable to this plan. - CoaguChek XS/INR Waived -  POCT INR  2. Chronic anticoagulation - CoaguChek XS/INR Waived - POCT INR    No orders of the defined types were placed in this encounter.  No orders of the defined types were placed in this encounter.    [name], DO [place] [phone]

## 2020-02-19 NOTE — Patient Instructions (Signed)
Coumadin level at goal today.  INR 2.1.  Okay to continue 1/2 tablet Mondays, Wednesdays and Fridays with 1 tablet daily all other days.  Follow-up in 4 weeks for recheck

## 2020-02-22 ENCOUNTER — Telehealth: Payer: Self-pay | Admitting: Family Medicine

## 2020-02-22 MED ORDER — WARFARIN SODIUM 5 MG PO TABS: ORAL_TABLET | ORAL | 3 refills | Status: DC

## 2020-02-22 NOTE — Telephone Encounter (Signed)
Patient aware and rx sent to pharmacy.  

## 2020-02-22 NOTE — Telephone Encounter (Signed)
  Medication Request  02/22/2020  What is the name of the medication? Warfarin  Have you contacted your pharmacy to request a refill? Yes  Which pharmacy would you like this sent to? [facility]   Patient notified that their request is being sent to the clinical staff for review and that they should receive a call once it is complete. If they do not receive a call within 24 hours they can check with their pharmacy or our office.   Gottschalk's pt.  Please call pt.  He has been waiting on this for while now.

## 2020-02-24 ENCOUNTER — Encounter: Payer: Self-pay | Admitting: Physical Therapy

## 2020-02-24 ENCOUNTER — Other Ambulatory Visit: Payer: Self-pay

## 2020-02-24 ENCOUNTER — Ambulatory Visit: Payer: Medicare Other | Admitting: Physical Therapy

## 2020-02-24 DIAGNOSIS — M542 Cervicalgia: Secondary | ICD-10-CM | POA: Diagnosis not present

## 2020-02-24 DIAGNOSIS — R293 Abnormal posture: Secondary | ICD-10-CM

## 2020-02-24 NOTE — Patient Instructions (Signed)
Strengthening: Lateral Bend - Isometric (in Neutral)    Using light pressure from fingertips, press into right then left temple. Resist bending head sideways. Hold __10__ seconds. Repeat _5___ times per set. Do _2___ sets per session. Do _2___ sessions per day.   Strengthening: Flexion - Isometric (in Neutral)    Using light pressure from fingertips at forehead, resist bending head forward. Hold __10__ seconds. Repeat __5__ times per set. Do __2__ sets per session. Do _2___ sessions per day.   Strengthening: Extension - Isometric (in Neutral)    Using light pressure from fingertips at back of head, resist bending head backward. Hold _10___ seconds. Repeat __5__ times per set. Do _2___ sets per session. Do __2__ sessions per day.   AROM: Neck Rotation   Turn head slowly to look over one shoulder, then the other. Hold each position _10___ seconds. Repeat _5___ times per set. Do __2__ sets per session. Do _2-3___ sessions per day.  http://orth.exer.us/294   Copyright  VHI. All rights reserved.  AROM: Lateral Neck Flexion   Slowly tilt head toward one shoulder, then the other. Hold each position _10___ seconds. Repeat __5__ times per set. Do __2__ sets per session. Do __2-3__ sessions per day.  http://orth.exer.us/296   Copyright  VHI. All rights reserved.  Stretch Break - Chin Tuck   Looking straight forward, tuck chin and hold __10__ seconds. Relax and return to starting position. Repeat __5-10__ times every _3-4___ hours.  Copyright  VHI. All rights reserved.  Stretch Break - Chest and Shoulder Stretch   Maintaining erect posture, draw shoulders back while bringing elbows back and inward. Return to starting position. Repeat __10-20__ times every _3-4___ hours.  Copyright  VHI. All rights reserved.

## 2020-02-24 NOTE — Therapy (Addendum)
[facility] [place], [place], [address] Phone: [phone]   Fax:  [phone]  Physical Therapy Treatment  Patient Details  Name: Dalton Vaughan MRN: [id number] Date of Birth: 1929-01-23 Referring Provider (PT): Darla Lesches   Encounter Date: 02/24/2020  PT End of Session - 02/24/20 1007    Visit Number  12    Number of Visits  12    Date for PT Re-Evaluation  04/11/20    Authorization Type  FOTO AT LEAST EVERY 5TH VISIT.  PROGRESS NOTE AT 10TH VISIT.  KX MODIFIER AFTER 15 VISITS.    PT Start Time  [phone]    PT Stop Time  1031    PT Time Calculation (min)  45 min    Activity Tolerance  Patient tolerated treatment well    Behavior During Therapy  WFL for tasks assessed/performed       Past Medical History:  Diagnosis Date  . Anal fissure   . Atrial fibrillation ([place])   . BPH (benign prostatic hypertrophy)   . CAD (coronary artery disease)    Dr. Wynonia Lawman  . Cataract   . COVID-19   . Depression 2004  . Diverticulosis   . GERD (gastroesophageal reflux disease)   . History of TIAs   . Hyperlipidemia   . Hypertension   . Hypertensive heart disease without CHF   . Hypothyroidism   . Internal hemorrhoids   . Lumbar disc disease   . Oral bleeding 08/20/2012   Following molar tooth extraction July, 2013  . Ruptured disk 1995  . Shingles   . Thyroid disease   . Vitamin D deficiency   . Vitreous detachment     Past Surgical History:  Procedure Laterality Date  . bilateral cataract surgery  6/07  . CARDIAC CATHETERIZATION    . CORONARY ARTERY BYPASS GRAFT  12/99    Dr. Servando Snare   . cysloscopy  8/08  . EYE SURGERY    . HERNIA REPAIR  1997   right  . herninated disc  1995  . PROSTATE BIOPSY  8/08  . TONSILLECTOMY AND ADENOIDECTOMY      There were no vitals filed for this visit.  Subjective Assessment - 02/24/20 0948    Subjective  COVID-19 screen performed prior to patient entering clinic.  No new complaints     Pertinent History  Hernia repair, OA, DDD, CABG, hypothyroidism, H/o TIA's, HTN.    Currently in Pain?  Yes    Pain Score  1     Pain Location  Neck    Pain Orientation  Left    Pain Descriptors / Indicators  Discomfort    Pain Type  Chronic pain    Pain Frequency  Intermittent    Aggravating Factors   certain movements    Pain Relieving Factors  at rest         [facility] PT Assessment - 02/24/20 0001      AROM   AROM Assessment Site  Cervical    Cervical - Right Rotation  65    Cervical - Left Rotation  65                   [facility] Adult PT Treatment/Exercise - 02/24/20 0001      Moist Heat Therapy   Number Minutes Moist Heat  15 Minutes    Moist Heat Location  Cervical      Electrical Stimulation   Electrical Stimulation Location  LT UT    Electrical  Stimulation Action  premod    Electrical Stimulation Parameters  80-150hz  x68mn    Electrical Stimulation Goals  Pain;Tone      Ultrasound   Ultrasound Location  left UT/cervical    Ultrasound Parameters  combo US/ES @ 1.5w/cm2/100% x182m    Ultrasound Goals  Pain      Manual Therapy   Manual Therapy  Soft tissue mobilization    Soft tissue mobilization  STW to LT cervical paraspinals, UT to reduce tone and pain               PT Short Term Goals - 01/21/20 1211      PT SHORT TERM GOAL #1   Title  Ind with a HEP.    Time  2    Period  Weeks    Status  On-going        PT Long Term Goals - 02/24/20 1020      PT LONG TERM GOAL #1   Title  Increase active cervical rotation to 65 degrees+ so patient can turn head more easily while driving.    Time  6    Period  Weeks    Status  Achieved   BIL cervial rotation 65 degrees 02/24/20     PT LONG TERM GOAL #2   Title  Perform ADL's with pain not > 3/10.    Time  6    Period  Weeks    Status  Achieved      PT LONG TERM GOAL #3   Title  Sit 30 minutes with pain not > 3/10.    Time  8    Period  Weeks    Status  Achieved             Plan - 02/24/20 1021    Clinical Impression Statement  Patient tolerated treatment well today. Patient has met all current goals and ready to DC to HEP. Patient FOTO 29% limitation.    Personal Factors and Comorbidities  Comorbidity 1;Comorbidity 2;Comorbidity 3+    Comorbidities  Hernia repair, OA, DDD, CABG, hypothyroidism, H/o TIA's, HTN.    Examination-Activity Limitations  Other    Examination-Participation Restrictions  Other    Stability/Clinical Decision Making  Evolving/Moderate complexity    Rehab Potential  Excellent    PT Frequency  2x / week    PT Duration  6 weeks    PT Treatment/Interventions  ADLs/Self Care Home Management;Cryotherapy;Electrical Stimulation;Ultrasound;Moist Heat;Therapeutic activities;Therapeutic exercise;Patient/family education;Passive range of motion    PT Next Visit Plan  DC    Consulted and Agree with Plan of Care  Patient       Patient will benefit from skilled therapeutic intervention in order to improve the following deficits and impairments:  Pain, Decreased activity tolerance, Decreased range of motion, Postural dysfunction  Visit Diagnosis: Cervicalgia  Abnormal posture     Problem List Patient Active Problem List   Diagnosis Date Noted  . Acute respiratory failure with hypoxia ([facility]09/30/2020  . Osteoarthritis of spine with radiculopathy, lumbar region 01/08/2018  . Thyroid disease   . Aortic atherosclerosis ([facility]05/17/2018  . Thrombocytopenia ([facility]07/25/2017  . Hereditary and idiopathic peripheral neuropathy 10/16/2015  . Mononeuropathy 10/04/2015  . Allergic rhinitis 05/06/2014  . Depression 08/03/2013  . Sick sinus syndrome ([facility]08/15/2013  . Hypertensive heart disease without CHF   . Chronic atrial fibrillation ([facility]  . Coronary artery disease involving native coronary artery of native heart with angina pectoris ([facility]  . History of  TIAs   . Lumbar disc disease   . GERD (gastroesophageal reflux disease)    . Chronic anticoagulation   . Benign prostatic hyperplasia   . Hyperlipidemia     Ladean Raya, PTA 02/24/20 10:36 AM  [facility] [address] [place], [place], [address] Phone: [phone]   Fax:  [phone]  Name: Eldean Nanna MRN: [id number] Date of Birth: May 24, 1929  PHYSICAL THERAPY DISCHARGE SUMMARY  Visits from Start of Care: 12.  Current functional level related to goals / functional outcomes: See above.   Remaining deficits: All goals met.   Education / Equipment: HEP. Plan: Patient agrees to discharge.  Patient goals were met. Patient is being discharged due to meeting the stated rehab goals.  ?????         Mali Applegate MPT

## 2020-03-04 ENCOUNTER — Other Ambulatory Visit: Payer: Self-pay | Admitting: Family Medicine

## 2020-03-09 ENCOUNTER — Other Ambulatory Visit: Payer: Self-pay | Admitting: Family Medicine

## 2020-03-09 ENCOUNTER — Telehealth: Payer: Self-pay | Admitting: Family Medicine

## 2020-03-09 DIAGNOSIS — F321 Major depressive disorder, single episode, moderate: Secondary | ICD-10-CM

## 2020-03-09 MED ORDER — MIRTAZAPINE 15 MG PO TABS: 15.0000 mg | ORAL_TABLET | Freq: Every day | ORAL | 1 refills | Status: DC

## 2020-03-09 NOTE — Telephone Encounter (Signed)
  Prescription Request  03/09/2020  What is the name of the medication or equipment? mirtazapine (REMERON) 15 MG tablet  Have you contacted your pharmacy to request a refill? (if applicable) no  Which pharmacy would you like this sent to? [facility]   Patient notified that their request is being sent to the clinical staff for review and that they should receive a response within 2 business days.

## 2020-03-14 ENCOUNTER — Telehealth: Payer: Self-pay | Admitting: Family Medicine

## 2020-03-14 NOTE — Telephone Encounter (Signed)
Patient has an area on his lower leg that appears to be an insect bite.  Area is red and inflammed and he wants someone to look at it.  Appointment scheduled tomorrow at 10:25 am with Dr. Warrick Parisian, patient aware.

## 2020-03-15 ENCOUNTER — Ambulatory Visit (INDEPENDENT_AMBULATORY_CARE_PROVIDER_SITE_OTHER): Payer: Medicare Other | Admitting: Family Medicine

## 2020-03-15 ENCOUNTER — Encounter: Payer: Self-pay | Admitting: Family Medicine

## 2020-03-15 ENCOUNTER — Other Ambulatory Visit: Payer: Self-pay

## 2020-03-15 VITALS — BP 150/90 | HR 65 | Temp 98.6°F | Ht 72.0 in | Wt 184.2 lb

## 2020-03-15 DIAGNOSIS — Z7901 Long term (current) use of anticoagulants: Secondary | ICD-10-CM | POA: Diagnosis not present

## 2020-03-15 DIAGNOSIS — W57XXXA Bitten or stung by nonvenomous insect and other nonvenomous arthropods, initial encounter: Secondary | ICD-10-CM | POA: Diagnosis not present

## 2020-03-15 DIAGNOSIS — I482 Chronic atrial fibrillation, unspecified: Secondary | ICD-10-CM | POA: Diagnosis not present

## 2020-03-15 MED ORDER — DOXYCYCLINE HYCLATE 100 MG PO TABS: 100.0000 mg | ORAL_TABLET | Freq: Two times a day (BID) | ORAL | 0 refills | Status: DC

## 2020-03-15 NOTE — Progress Notes (Signed)
BP (!) 150/90   Pulse 65   Temp 98.6 F (37 C)   Ht 6' (1.829 m)   Wt 184 lb 4 oz (83.6 kg)   SpO2 90%   BMI 24.99 kg/m    Subjective:   Patient ID: Dalton Vaughan, male    DOB: 09-24-1929, 86 y.o.   MRN: [id number]  HPI: Dalton Vaughan is a 86 y.o. male presenting on 03/15/2020 for Rash (LLE)   HPI Possible tick bite Patient has a possible tick bite on his posterior left lower leg where it has a central eschar and then redness around it he says it was bigger but it started to come down a little bit and it is very pruritic.  He did not see the actual tick but noticed this about a week ago.  Patient denies any fevers or chills or body aches.  Relevant past medical, surgical, family and social history reviewed and updated as indicated. Interim medical history since our last visit reviewed. Allergies and medications reviewed and updated.  Review of Systems  Skin: Positive for color change, rash and wound.    Per HPI unless specifically indicated above   Allergies as of 03/15/2020      Reactions   Paxil [paroxetine Hcl] Palpitations   Eliquis [apixaban] Other (See Comments)   dizziness   Penicillins Other (See Comments)   Did it involve swelling of the face/tongue/throat, SOB, or low BP? Unknown Did it involve sudden or severe rash/hives, skin peeling, or any reaction on the inside of your mouth or nose? Unknown Did you need to seek medical attention at a hospital or doctor's office? Unknown When did it last happen?unk If all above answers are "NO", may proceed with cephalosporin use.   Pravachol [pravastatin Sodium] Other (See Comments)   myalgias   Zetia [ezetimibe] Other (See Comments)   myalgias   Vytorin [ezetimibe-simvastatin] Other (See Comments)   myalgias      Medication List       Accurate as of March 15, 2020 11:12 AM. If you have any questions, ask your nurse or doctor.        STOP taking these medications   olopatadine 0.1 % ophthalmic  solution Commonly known as: PATANOL Stopped by: Fransisca Kaufmann Calloway Andrus, MD   predniSONE 10 MG (21) Tbpk tablet Commonly known as: STERAPRED UNI-PAK 21 TAB Stopped by: Fransisca Kaufmann Ling Flesch, MD     TAKE these medications   amLODipine 5 MG tablet Commonly known as: NORVASC Take 1 tablet (5 mg total) by mouth daily. What changed:   how much to take  when to take this   Calcium + D3 600-200 MG-UNIT Tabs Take 1 tablet by mouth daily.   doxazosin 8 MG tablet Commonly known as: CARDURA Take 1 tablet (8 mg total) by mouth at bedtime.   doxycycline 100 MG tablet Commonly known as: VIBRA-TABS Take 1 tablet (100 mg total) by mouth 2 (two) times daily. 1 po bid Started by: Worthy Rancher, MD   levothyroxine 50 MCG tablet Commonly known as: SYNTHROID TAKE 1 TABLET DAILY BEFORE BREAKFAST   metoprolol succinate 25 MG 24 hr tablet Commonly known as: TOPROL-XL Take by mouth.   mirtazapine 15 MG tablet Commonly known as: Remeron Take 1 tablet (15 mg total) by mouth at bedtime.   multivitamin tablet Take 1 tablet by mouth daily.   ramipril 10 MG capsule Commonly known as: Altace Take 1 capsule (10 mg total) by mouth daily.   rosuvastatin 5 MG  tablet Commonly known as: CRESTOR Take 1 tablet (5 mg total) by mouth daily. as directed What changed:   when to take this  additional instructions   triamterene-hydrochlorothiazide 37.5-25 MG tablet Commonly known as: MAXZIDE-25 TAKE 1 TABLET DAILY   vitamin C 500 MG tablet Commonly known as: ASCORBIC ACID Take 500 mg by mouth daily.   Vitamin D-3 125 MCG (5000 UT) Tabs Take 1 tablet by mouth daily.   warfarin 5 MG tablet Commonly known as: COUMADIN Take as directed by the anticoagulation clinic. If you are unsure how to take this medication, talk to your nurse or doctor. Original instructions: TAKE AS DIRECTED   Xiidra 5 % Soln Generic drug: Lifitegrast Apply 1 drop to eye daily.        Objective:   BP (!) 150/90    Pulse 65   Temp 98.6 F (37 C)   Ht 6' (1.829 m)   Wt 184 lb 4 oz (83.6 kg)   SpO2 90%   BMI 24.99 kg/m   Wt Readings from Last 3 Encounters:  03/15/20 184 lb 4 oz (83.6 kg)  02/19/20 186 lb 3.2 oz (84.5 kg)  02/12/20 186 lb (84.4 kg)    Physical Exam Vitals and nursing note reviewed.  Constitutional:      General: He is not in acute distress.    Appearance: He is well-developed. He is not diaphoretic.  Eyes:     General: No scleral icterus.    Conjunctiva/sclera: Conjunctivae normal.  Neck:     Thyroid: No thyromegaly.  Skin:    General: Skin is warm and dry.     Findings: Lesion and rash present.       Neurological:     Mental Status: He is alert and oriented to person, place, and time.     Coordination: Coordination normal.  Psychiatric:        Behavior: Behavior normal.     Description   INR none today (goal2.0-3.0) Recheck in 2-3 weeks Recommended for just this treatment to take a half a tablet every day while on the doxycycline      Assessment & Plan:   Problem List Items Addressed This Visit      Cardiovascular and Mediastinum   Chronic atrial fibrillation (HCC) (Chronic)     Other   Chronic anticoagulation (Chronic)    Other Visit Diagnoses    Tick bite, initial encounter    -  Primary   Relevant Medications   doxycycline (VIBRA-TABS) 100 MG tablet      Adjust his Coumadin dose Follow up plan: Return in about 3 weeks (around 04/05/2020), or if symptoms worsen or fail to improve, for INR recheck.  Counseling provided for all of the vaccine components No orders of the defined types were placed in this encounter.   Caryl Pina, MD [facility] 03/15/2020, 11:12 AM

## 2020-03-21 ENCOUNTER — Ambulatory Visit: Payer: Medicare Other | Admitting: Family Medicine

## 2020-03-22 NOTE — Progress Notes (Addendum)
Subjective: CC: f/u afib PCP: [name], DO HPI:Dalton Vaughan is a 86 y.o. male presenting to clinic today for:  1.  Atrial fibrillation Allergic reaction to Eliquis in past.  Did home INR but a hard stick so comes to office now.  Goal 2-3.  Patient reports that he was on doxycycline last week for an infected bug bite.  His dose was lowered to 1/2 tablet daily of the Coumadin.  He has been compliant with this regimen.  He comes in today for repeat INR check.  Denies any chest pain, shortness of breath, swelling, neurologic changes.  2.  Hypertension Patient reports compliance with quadruple therapy.  He only takes half a tablet of the amlodipine.  He checks his blood pressures at home and they typically run 140s over 80s to 90s.  Again no chest pain, shortness of breath or visual disturbance.  ROS: Per HPI  Allergies  Allergen Reactions  . Paxil [Paroxetine Hcl] Palpitations  . Eliquis [Apixaban] Other (See Comments)    dizziness  . Penicillins Other (See Comments)    Did it involve swelling of the face/tongue/throat, SOB, or low BP? Unknown Did it involve sudden or severe rash/hives, skin peeling, or any reaction on the inside of your mouth or nose? Unknown Did you need to seek medical attention at a hospital or doctor's office? Unknown When did it last happen?unk If all above answers are "NO", may proceed with cephalosporin use.   Christy Sartorius Sodium] Other (See Comments)    myalgias  . Zetia [Ezetimibe] Other (See Comments)    myalgias  . Vytorin [Ezetimibe-Simvastatin] Other (See Comments)    myalgias   Past Medical History:  Diagnosis Date  . Anal fissure   . Atrial fibrillation ([place])   . BPH (benign prostatic hypertrophy)   . CAD (coronary artery disease)    Dr. Wynonia Lawman  . Cataract   . COVID-19   . Depression 2004  . Diverticulosis   . GERD (gastroesophageal reflux disease)   . History of TIAs   . Hyperlipidemia   . Hypertension   .  Hypertensive heart disease without CHF   . Hypothyroidism   . Internal hemorrhoids   . Lumbar disc disease   . Oral bleeding 08/20/2012   Following molar tooth extraction July, 2013  . Ruptured disk 1995  . Shingles   . Thyroid disease   . Vitamin D deficiency   . Vitreous detachment     Current Outpatient Medications:  .  amLODipine (NORVASC) 5 MG tablet, Take 1 tablet (5 mg total) by mouth daily. (Patient taking differently: Take 2.5 mg by mouth 2 (two) times daily. ), Disp: 90 tablet, Rfl: 3 .  Calcium Carb-Cholecalciferol (CALCIUM + D3) 600-200 MG-UNIT TABS, Take 1 tablet by mouth daily. , Disp: , Rfl:  .  Cholecalciferol (VITAMIN D-3) 5000 UNITS TABS, Take 1 tablet by mouth daily. , Disp: , Rfl:  .  doxazosin (CARDURA) 8 MG tablet, Take 1 tablet (8 mg total) by mouth at bedtime., Disp: 90 tablet, Rfl: 3 .  doxycycline (VIBRA-TABS) 100 MG tablet, Take 1 tablet (100 mg total) by mouth 2 (two) times daily. 1 po bid, Disp: 28 tablet, Rfl: 0 .  levothyroxine (SYNTHROID) 50 MCG tablet, TAKE 1 TABLET DAILY BEFORE BREAKFAST, Disp: 30 tablet, Rfl: 0 .  metoprolol succinate (TOPROL-XL) 25 MG 24 hr tablet, Take by mouth., Disp: , Rfl:  .  mirtazapine (REMERON) 15 MG tablet, Take 1 tablet (15 mg total) by  mouth at bedtime., Disp: 90 tablet, Rfl: 1 .  Multiple Vitamin (MULTIVITAMIN) tablet, Take 1 tablet by mouth daily.  , Disp: , Rfl:  .  ramipril (ALTACE) 10 MG capsule, Take 1 capsule (10 mg total) by mouth daily., Disp: 90 capsule, Rfl: 1 .  rosuvastatin (CRESTOR) 5 MG tablet, Take 1 tablet (5 mg total) by mouth daily. as directed (Patient taking differently: Take 5 mg by mouth every Monday, Wednesday, and Friday. ), Disp: 90 tablet, Rfl: 3 .  triamterene-hydrochlorothiazide (MAXZIDE-25) 37.5-25 MG tablet, TAKE 1 TABLET DAILY, Disp: 90 tablet, Rfl: 3 .  vitamin C (ASCORBIC ACID) 500 MG tablet, Take 500 mg by mouth daily. , Disp: , Rfl:  .  warfarin (COUMADIN) 5 MG tablet, TAKE AS DIRECTED, Disp:  90 tablet, Rfl: 3 .  XIIDRA 5 % SOLN, Apply 1 drop to eye daily. , Disp: , Rfl:  Social History   Socioeconomic History  . Marital status: Widowed    Spouse name: [name]   . Number of children: 1  . Years of education: 51  . Highest education level: Not on file  Occupational History  . Occupation: Retired    Comment: Social research officer, government x 27 years  . 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 Smoking status: Former Smoker    Packs/day: 1.00    Years: 18.00    Pack years: 18.00    Types: Cigarettes    Start date: 12/17/1948    Quit date: 12/17/1966    Years since quitting: 53.2  . Smokeless tobacco: Never Used  Substance and Sexual Activity  . Alcohol use: Yes    Alcohol/week: 1.0 standard drinks    Types: 1 Standard drinks or equivalent per week    Comment: Occasionally  . Drug use: No  . Sexual activity: Not on file  Other Topics Concern  . Not on file  Social History Narrative   Married.  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 Difficulty of Paying Living Expenses:   Food Insecurity:   . Worried About Charity fundraiser in the Last Year:   . Arboriculturist in the Last Year:   Transportation Needs:   . Film/video editor (Medical):   Marland Kitchen Lack of Transportation (Non-Medical):   Physical Activity:   . Days of Exercise per Week:   . Minutes of Exercise per Session:   Stress:   . Feeling of Stress :   Social Connections:   . Frequency of Communication with Friends and Family:   . Frequency of Social Gatherings with Friends and Family:   . Attends Religious Services:   . Active Member of Clubs or Organizations:   . Attends Archivist Meetings:   Marland Kitchen Marital Status:   Intimate Partner Violence:   . Fear of Current or Ex-Partner:   . Emotionally Abused:   Marland Kitchen Physically  Abused:   . Sexually Abused:    Family History  Problem Relation Age of Onset  . Dementia Mother   . Arthritis Mother   . Cancer Brother        prostate  . Asthma Sister   . Heart disease Paternal Aunt   . Heart disease Paternal Uncle   . Hypertension Maternal  Grandmother   . CVA Maternal Grandmother   . CVA Paternal Grandmother   . Hepatitis C Son   . Arthritis Son   . Colon cancer Neg Hx   . Colon polyps Neg Hx   . Kidney disease Neg Hx   . Esophageal cancer Neg Hx   . Gallbladder disease Neg Hx   . Diabetes Neg Hx     Objective: Office vital signs reviewed. BP (!) 169/89   Pulse 71   Temp 99.1 F (37.3 C) (Temporal)   Ht 6' (1.829 m)   Wt 182 lb (82.6 kg)   SpO2 98%   BMI 24.68 kg/m   Physical Examination:  General: Awake, alert, well nourished, No acute distress HEENT: Normal no conjunctival pallor Cardio: Irregularly irregular, S1-S2 heard.  No murmurs. Pulm: Normal work of breathing on room air; clear to auscultation bilaterally.  Normal work of breathing on room air.  No wheezes, rhonchi or rales.  Assessment/ Plan: 86 y.o. male   1. Chronic atrial fibrillation (HCC) INR subtherapeutic.  Go back to normal regimen of 1/2 tablet Monday Wednesday Friday with 1 tablet daily all other days.  Follow-up in 1 week. - CoaguChek XS/INR Waived - Thyroid Panel With TSH - POCT INR  2. Chronic anticoagulation - POCT INR  3. Uncontrolled hypertension Blood pressure is not well controlled.  Increase Norvasc to a full 5 mg daily.  He had been taking 2-1/2 mg for some reason.  Continue other regimen.  Follow-up in 1 week for recheck.  May need to consider going up to 10 mg on the amlodipine.   No orders of the defined types were placed in this encounter.  No orders of the defined types were placed in this encounter.    [name], DO [place] [phone]

## 2020-03-23 ENCOUNTER — Encounter: Payer: Self-pay | Admitting: Family Medicine

## 2020-03-23 ENCOUNTER — Other Ambulatory Visit: Payer: Self-pay

## 2020-03-23 ENCOUNTER — Ambulatory Visit (INDEPENDENT_AMBULATORY_CARE_PROVIDER_SITE_OTHER): Payer: Medicare Other | Admitting: Family Medicine

## 2020-03-23 VITALS — BP 169/89 | HR 71 | Temp 99.1°F | Ht 72.0 in | Wt 182.0 lb

## 2020-03-23 DIAGNOSIS — Z7901 Long term (current) use of anticoagulants: Secondary | ICD-10-CM | POA: Diagnosis not present

## 2020-03-23 DIAGNOSIS — I482 Chronic atrial fibrillation, unspecified: Secondary | ICD-10-CM | POA: Diagnosis not present

## 2020-03-23 DIAGNOSIS — I10 Essential (primary) hypertension: Secondary | ICD-10-CM

## 2020-03-23 LAB — COAGUCHEK XS/INR WAIVED
INR: 1.5 — ABNORMAL HIGH (ref 0.9–1.1)
Prothrombin Time: 17.7 s

## 2020-03-23 LAB — POCT INR: INR: 1.5 — AB (ref 2–3)

## 2020-03-23 NOTE — Patient Instructions (Signed)
Go up on the Amlodipine.  Blood pressure is too high and has been on the last several checks.  Take 1 full 37m tablet daily.  Go back to your normal coumadin regimen of 1/2 tab MWF and 1 tab daily all other days.

## 2020-03-30 ENCOUNTER — Other Ambulatory Visit: Payer: Self-pay

## 2020-03-30 ENCOUNTER — Ambulatory Visit (INDEPENDENT_AMBULATORY_CARE_PROVIDER_SITE_OTHER): Payer: Medicare Other | Admitting: Family Medicine

## 2020-03-30 ENCOUNTER — Encounter: Payer: Self-pay | Admitting: Family Medicine

## 2020-03-30 VITALS — BP 180/91 | HR 69 | Temp 97.7°F | Resp 20 | Ht 72.0 in | Wt 183.0 lb

## 2020-03-30 DIAGNOSIS — I10 Essential (primary) hypertension: Secondary | ICD-10-CM | POA: Diagnosis not present

## 2020-03-30 DIAGNOSIS — I482 Chronic atrial fibrillation, unspecified: Secondary | ICD-10-CM

## 2020-03-30 DIAGNOSIS — E079 Disorder of thyroid, unspecified: Secondary | ICD-10-CM | POA: Diagnosis not present

## 2020-03-30 DIAGNOSIS — Z7901 Long term (current) use of anticoagulants: Secondary | ICD-10-CM | POA: Diagnosis not present

## 2020-03-30 LAB — POCT INR: INR: 2.1 (ref 2.0–3.0)

## 2020-03-30 LAB — COAGUCHEK XS/INR WAIVED
INR: 2.1 — ABNORMAL HIGH (ref 0.9–1.1)
Prothrombin Time: 24.6 s

## 2020-03-30 MED ORDER — AMLODIPINE BESYLATE 10 MG PO TABS: 10.0000 mg | ORAL_TABLET | Freq: Every day | ORAL | 3 refills | Status: DC

## 2020-03-30 MED ORDER — METOPROLOL SUCCINATE ER 25 MG PO TB24: 25.0000 mg | ORAL_TABLET | Freq: Every day | ORAL | 0 refills | Status: DC

## 2020-03-30 NOTE — Patient Instructions (Signed)
DASH Eating Plan DASH stands for "Dietary Approaches to Stop Hypertension." The DASH eating plan is a healthy eating plan that has been shown to reduce high blood pressure (hypertension). Additional health benefits may include reducing the risk of type 2 diabetes mellitus, heart disease, and stroke. The DASH eating plan may also help with weight loss.  WHAT DO I NEED TO KNOW ABOUT THE DASH EATING PLAN? For the DASH eating plan, you will follow these general guidelines:  Choose foods with a percent daily value for sodium of less than 5% (as listed on the food label).  Use salt-free seasonings or herbs instead of table salt or sea salt.  Check with your health care provider or pharmacist before using salt substitutes.  Eat lower-sodium products, often labeled as "lower sodium" or "no salt added."  Eat fresh foods.  Eat more vegetables, fruits, and low-fat dairy products.  Choose whole grains. Look for the word "whole" as the first word in the ingredient list.  Choose fish and skinless chicken or [place] more often than red meat. Limit fish, poultry, and meat to 6 oz (170 g) each day.  Limit sweets, desserts, sugars, and sugary drinks.  Choose heart-healthy fats.  Limit cheese to 1 oz (28 g) per day.  Eat more home-cooked food and less restaurant, buffet, and fast food.  Limit fried foods.  Cook foods using methods other than frying.  Limit canned vegetables. If you do use them, rinse them well to decrease the sodium.  When eating at a restaurant, ask that your food be prepared with less salt, or no salt if possible.  WHAT FOODS CAN I EAT? Seek help from a dietitian for individual calorie needs.  Grains Whole grain or whole wheat bread. Brown rice. Whole grain or whole wheat pasta. Quinoa, bulgur, and whole grain cereals. Low-sodium cereals. Corn or whole wheat flour tortillas. Whole grain cornbread. Whole grain crackers. Low-sodium crackers.  Vegetables Fresh or frozen  vegetables (raw, steamed, roasted, or grilled). Low-sodium or reduced-sodium tomato and vegetable juices. Low-sodium or reduced-sodium tomato sauce and paste. Low-sodium or reduced-sodium canned vegetables.   Fruits All fresh, canned (in natural juice), or frozen fruits.  Meat and Other Protein Products Ground beef (85% or leaner), grass-fed beef, or beef trimmed of fat. Skinless chicken or [place]. Ground chicken or [place]. Pork trimmed of fat. All fish and seafood. Eggs. Dried beans, peas, or lentils. Unsalted nuts and seeds. Unsalted canned beans.  Dairy Low-fat dairy products, such as skim or 1% milk, 2% or reduced-fat cheeses, low-fat ricotta or cottage cheese, or plain low-fat yogurt. Low-sodium or reduced-sodium cheeses.  Fats and Oils Tub margarines without trans fats. Light or reduced-fat mayonnaise and salad dressings (reduced sodium). Avocado. Safflower, olive, or canola oils. Natural peanut or almond butter.  Other Unsalted popcorn and pretzels. The items listed above may not be a complete list of recommended foods or beverages. Contact your dietitian for more options.  WHAT FOODS ARE NOT RECOMMENDED?  Grains White bread. White pasta. White rice. Refined cornbread. Bagels and croissants. Crackers that contain trans fat.  Vegetables Creamed or fried vegetables. Vegetables in a cheese sauce. Regular canned vegetables. Regular canned tomato sauce and paste. Regular tomato and vegetable juices.  Fruits Dried fruits. Canned fruit in light or heavy syrup. Fruit juice.  Meat and Other Protein Products Fatty cuts of meat. Ribs, chicken wings, bacon, sausage, bologna, salami, chitterlings, fatback, hot dogs, bratwurst, and packaged luncheon meats. Salted nuts and seeds. Canned beans with salt.  Dairy Whole or 2% milk, cream, half-and-half, and cream cheese. Whole-fat or sweetened yogurt. Full-fat cheeses or blue cheese. Nondairy creamers and whipped toppings. Processed cheese,  cheese spreads, or cheese curds.  Condiments Onion and garlic salt, seasoned salt, table salt, and sea salt. Canned and packaged gravies. Worcestershire sauce. Tartar sauce. Barbecue sauce. Teriyaki sauce. Soy sauce, including reduced sodium. Steak sauce. Fish sauce. Oyster sauce. Cocktail sauce. Horseradish. Ketchup and mustard. Meat flavorings and tenderizers. Bouillon cubes. Hot sauce. Tabasco sauce. Marinades. Taco seasonings. Relishes.  Fats and Oils Butter, stick margarine, lard, shortening, ghee, and bacon fat. Coconut, palm kernel, or palm oils. Regular salad dressings.  Other Pickles and olives. Salted popcorn and pretzels.  The items listed above may not be a complete list of foods and beverages to avoid. Contact your dietitian for more information.  WHERE CAN I FIND MORE INFORMATION? National Heart, Lung, and Blood Institute: travelstabloid.com Document Released: 11/22/2011 Document Revised: 04/19/2014 Document Reviewed: 10/07/2013 [facility] Patient Information 2015 [place], [place]. This information is not intended to replace advice given to you by your health care provider. Make sure you discuss any questions you have with your health care provider.   I think that you would greatly benefit from seeing a nutritionist.  If you are interested, please call Dr Jenne Campus at [phone] to schedule an appointment.

## 2020-03-30 NOTE — Progress Notes (Addendum)
Subjective:  Patient ID: Dalton Vaughan, male    DOB: April 16, 1929, 86 y.o.   MRN: [id number]  Patient Care Team: [name], DO as PCP - General (Family Medicine) Jacolyn Reedy, MD as Consulting Physician (Cardiology) Deboraha Sprang, MD (Cardiology) Raynelle Bring, MD as Consulting Physician (Urology) Pyrtle, Lajuan Lines, MD as Consulting Physician (Gastroenterology) Clent Jacks, MD as Consulting Physician (Ophthalmology)   Chief Complaint:  Anticoagulation   HPI: Dalton Vaughan is a 86 y.o. male presenting on 03/30/2020 for Anticoagulation  1. Chronic atrial fibrillation ([place]) 2. Chronic anticoagulation On coumadin therapy and compliant with regimen. No abnormal bleeding or bruising.   3. Uncontrolled hypertension Blood pressure still elevated today on initial check. Rechecked manually, 150/82. Denies symptoms. Has increased Norvasc to 5 mg daily.      Relevant past medical, surgical, family, and social history reviewed and updated as indicated.  Allergies and medications reviewed and updated. Date reviewed: Chart in Epic.   Past Medical History:  Diagnosis Date  . Anal fissure   . Atrial fibrillation ([place])   . BPH (benign prostatic hypertrophy)   . CAD (coronary artery disease)    Dr. Wynonia Lawman  . Cataract   . COVID-19   . Depression 2004  . Diverticulosis   . GERD (gastroesophageal reflux disease)   . History of TIAs   . Hyperlipidemia   . Hypertension   . Hypertensive heart disease without CHF   . Hypothyroidism   . Internal hemorrhoids   . Lumbar disc disease   . Oral bleeding 08/20/2012   Following molar tooth extraction July, 2013  . Ruptured disk 1995  . Shingles   . Thyroid disease   . Vitamin D deficiency   . Vitreous detachment     Past Surgical History:  Procedure Laterality Date  . bilateral cataract surgery  6/07  . CARDIAC CATHETERIZATION    . CORONARY ARTERY BYPASS GRAFT  12/99    Dr. Servando Snare   . cysloscopy  8/08  . EYE SURGERY    .  HERNIA REPAIR  1997   right  . herninated disc  1995  . PROSTATE BIOPSY  8/08  . TONSILLECTOMY AND ADENOIDECTOMY      Social History   Socioeconomic History  . Marital status: Widowed    Spouse name: [name]   . Number of children: 1  . Years of education: 17  . Highest education level: Not on file  Occupational History  . Occupation: Retired    Comment: Social research officer, government x 27 years  . 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 Smoking status: Former Smoker    Packs/day: 1.00    Years: 18.00    Pack years: 18.00    Types: Cigarettes    Start date: 12/17/1948    Quit date: 12/17/1966    Years since quitting: 53.3  . Smokeless tobacco: Never Used  Substance and Sexual Activity  . Alcohol use: Yes    Alcohol/week: 1.0 standard drinks    Types: 1 Standard drinks or equivalent per week    Comment: Occasionally  . Drug use: No  . Sexual activity: Not on file  Other Topics Concern  . Not on file  Social History Narrative   Married.  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 Difficulty of Paying Living Expenses:   Food Insecurity:   . Worried About Charity fundraiser in the Last Year:   . Arboriculturist in the Last Year:   Transportation Needs:   . Film/video editor (Medical):   Marland Kitchen Lack of Transportation (Non-Medical):   Physical Activity:   . Days of Exercise per Week:   . Minutes of Exercise per Session:   Stress:   . Feeling of Stress :   Social Connections:   . Frequency of Communication with Friends and Family:   . Frequency of Social Gatherings with Friends and Family:   . Attends Religious Services:   . Active Member of Clubs or Organizations:   . Attends Archivist Meetings:   Marland Kitchen Marital Status:   Intimate Partner Violence:   . Fear of Current or Ex-Partner:     . Emotionally Abused:   Marland Kitchen Physically Abused:   . Sexually Abused:     Outpatient Encounter Medications as of 03/30/2020  Medication Sig  . amLODipine (NORVASC) 10 MG tablet Take 1 tablet (10 mg total) by mouth daily.  . Calcium Carb-Cholecalciferol (CALCIUM + D3) 600-200 MG-UNIT TABS Take 1 tablet by mouth daily.   . Cholecalciferol (VITAMIN D-3) 5000 UNITS TABS Take 1 tablet by mouth daily.   Marland Kitchen doxazosin (CARDURA) 8 MG tablet Take 1 tablet (8 mg total) by mouth at bedtime.  Marland Kitchen doxycycline (VIBRA-TABS) 100 MG tablet Take 1 tablet (100 mg total) by mouth 2 (two) times daily. 1 po bid  . levothyroxine (SYNTHROID) 50 MCG tablet TAKE 1 TABLET DAILY BEFORE BREAKFAST  . metoprolol succinate (TOPROL-XL) 25 MG 24 hr tablet Take by mouth.  . mirtazapine (REMERON) 15 MG tablet Take 1 tablet (15 mg total) by mouth at bedtime.  . Multiple Vitamin (MULTIVITAMIN) tablet Take 1 tablet by mouth daily.    . ramipril (ALTACE) 10 MG capsule Take 1 capsule (10 mg total) by mouth daily.  . rosuvastatin (CRESTOR) 5 MG tablet Take 1 tablet (5 mg total) by mouth daily. as directed (Patient taking differently: Take 5 mg by mouth every Monday, Wednesday, and Friday. )  . triamterene-hydrochlorothiazide (MAXZIDE-25) 37.5-25 MG tablet TAKE 1 TABLET DAILY  . vitamin C (ASCORBIC ACID) 500 MG tablet Take 500 mg by mouth daily.   Marland Kitchen warfarin (COUMADIN) 5 MG tablet TAKE AS DIRECTED  . XIIDRA 5 % SOLN Apply 1 drop to eye daily.   . [DISCONTINUED] amLODipine (NORVASC) 5 MG tablet Take 1 tablet (5 mg total) by mouth daily. (Patient taking differently: Take 2.5 mg by mouth 2 (two) times daily. )   No facility-administered encounter medications on file as of 03/30/2020.    Allergies  Allergen Reactions  . Paxil [Paroxetine Hcl] Palpitations  . Eliquis [Apixaban] Other (See Comments)    dizziness  . Penicillins Other (See Comments)    Did it involve swelling of the face/tongue/throat, SOB, or low BP? Unknown Did it involve  sudden or severe rash/hives, skin peeling, or any reaction on the inside of your mouth or nose? Unknown Did you need to seek medical attention at a hospital or doctor's office? Unknown When did it last happen?unk If all above answers are "NO", may proceed with cephalosporin use.   Christy Sartorius Sodium] Other (See Comments)    myalgias  . Zetia [Ezetimibe] Other (See Comments)    myalgias  . Vytorin [Ezetimibe-Simvastatin] Other (See Comments)  myalgias    Review of Systems  Constitutional: Positive for activity change and fatigue. Negative for appetite change, chills, diaphoresis, fever and unexpected weight change.  Eyes: Negative for photophobia and visual disturbance.  Respiratory: Negative for shortness of breath.   Cardiovascular: Positive for leg swelling. Negative for chest pain and palpitations.  Gastrointestinal: Negative for abdominal pain and anal bleeding.  Genitourinary: Negative for decreased urine volume and difficulty urinating.  Neurological: Positive for weakness (generalized). Negative for dizziness, tremors, seizures, syncope, facial asymmetry, speech difficulty, light-headedness, numbness and headaches.  Hematological: Does not bruise/bleed easily.  All other systems reviewed and are negative.       Objective:  BP (!) 180/91   Pulse 69   Temp 97.7 F (36.5 C) (Temporal)   Resp 20   Ht 6' (1.829 m)   Wt 183 lb (83 kg)   SpO2 97%   BMI 24.82 kg/m    Wt Readings from Last 3 Encounters:  03/30/20 183 lb (83 kg)  03/23/20 182 lb (82.6 kg)  03/15/20 184 lb 4 oz (83.6 kg)    Physical Exam Vitals and nursing note reviewed.  Constitutional:      Appearance: Normal appearance.  HENT:     Head: Normocephalic and atraumatic.     Mouth/Throat:     Mouth: Mucous membranes are moist.     Pharynx: Oropharynx is clear.  Eyes:     Conjunctiva/sclera: Conjunctivae normal.     Pupils: Pupils are equal, round, and reactive to light.    Cardiovascular:     Rate and Rhythm: Normal rate. Rhythm irregular.     Pulses: Normal pulses.     Heart sounds: No murmur. No gallop.   Pulmonary:     Breath sounds: Normal breath sounds.  Abdominal:     General: Bowel sounds are normal.  Musculoskeletal:     Right lower leg: 1+ Pitting Edema present.     Left lower leg: 1+ Pitting Edema present.  Skin:    General: Skin is warm and dry.     Capillary Refill: Capillary refill takes less than 2 seconds.  Neurological:     General: No focal deficit present.     Mental Status: He is alert and oriented to person, place, and time.  Psychiatric:        Mood and Affect: Mood is depressed.        Behavior: Behavior normal.        Thought Content: Thought content normal.     Comments: Patient attributes his depressed mood to the death of his wife in 09/17/2023 and his increased fatigue.     Results for orders placed or performed in visit on 03/30/20  POCT INR  Result Value Ref Range   INR 2.1 2.0 - 3.0       Pertinent labs & imaging results that were available during my care of the patient were reviewed by me and considered in my medical decision making.  Assessment & Plan:  Mattheu was seen today for anticoagulation.  Diagnoses and all orders for this visit:  Chronic atrial fibrillation (HCC) Chronic anticoagulation INR 2.1 today. -     INR therapeutic. Continue current dosage of Coumadin.  Uncontrolled hypertension DASH diet and exercise encouraged. Will increase Norvasc to 10 mg today and refill metoprolol.        -     Amlodipine increased to 10 mg.  Pt reports needing thyroid medications refilled today. Will check TSH along with BMP and refill  once labs have resulted.    Continue all other maintenance medications.  Follow up plan: Return in 1 week (on 04/06/2020), or if symptoms worsen or fail to improve, for BP recheck with nurse.   Medical decision-making:  15 minutes spent today reviewing the medical chart,  counseling the patient/family, and documenting today's visit.  Continue healthy lifestyle choices, including diet (rich in fruits, vegetables, and lean proteins, and low in salt and simple carbohydrates) and exercise (at least 30 minutes of moderate physical activity daily).  Educational handout given for heart healthy diet.  The above assessment and management plan was discussed with the patient. The patient verbalized understanding of and has agreed to the management plan. Patient is aware to call the clinic if they develop any new symptoms or if symptoms persist or worsen. Patient is aware when to return to the clinic for a follow-up visit. Patient educated on when it is appropriate to go to the emergency department.   Robynn Pane, RN, FNP student  I personally was present during the history, physical exam, and medical decision-making activities of this service and have verified that the service and findings are accurately documented in the nurse practitioner student's note.  Monia Pouch, FNP-C [facility] [address] [place], [place] [address] [phone]

## 2020-03-31 LAB — BMP8+EGFR
BUN/Creatinine Ratio: 12 (ref 10–24)
BUN: 12 mg/dL (ref 8–27)
CO2: 26 mmol/L (ref 20–29)
Calcium: 9.1 mg/dL (ref 8.6–10.2)
Chloride: 103 mmol/L (ref 96–106)
Creatinine, Ser: 1 mg/dL (ref 0.76–1.27)
GFR calc Af Amer: 77 mL/min/{1.73_m2} (ref >59)
GFR calc non Af Amer: 67 mL/min/{1.73_m2} (ref >59)
Glucose: 83 mg/dL (ref 65–99)
Potassium: 5.7 mmol/L — ABNORMAL HIGH (ref 3.5–5.2)
Sodium: 142 mmol/L (ref 134–144)

## 2020-03-31 LAB — THYROID PANEL WITH TSH
Free Thyroxine Index: 2.4 (ref 1.2–4.9)
T3 Uptake Ratio: 33 % (ref 24–39)
T4, Total: 7.2 ug/dL (ref 4.5–12.0)
TSH: 4.14 u[IU]/mL (ref 0.450–4.500)

## 2020-04-01 ENCOUNTER — Telehealth: Payer: Self-pay | Admitting: Family Medicine

## 2020-04-01 NOTE — Chronic Care Management (AMB) (Signed)
  Chronic Care Management   Note  04/01/2020 Name: Dalton Vaughan MRN: [id number] DOB: [birth year]  Dalton Vaughan is a 86 y.o. year old male who is a primary care patient of [name], DO. I reached out to [employer] by phone today in response to a referral sent by [name]'s health plan.     [name] was given information about Chronic Care Management services today including:  1. CCM service includes personalized support from designated clinical staff supervised by his physician, including individualized plan of care and coordination with other care providers 2. 24/7 contact phone numbers for assistance for urgent and routine care needs. 3. Service will only be billed when office clinical staff spend 20 minutes or more in a month to coordinate care. 4. Only one practitioner may furnish and bill the service in a calendar month. 5. The patient may stop CCM services at any time (effective at the end of the month) by phone call to the office staff. 6. The patient will be responsible for cost sharing (co-pay) of up to 20% of the service fee (after annual deductible is met).  Patient did not agree to enrollment in care management services and does not wish to consider at this time.  Follow up plan: The patient has been provided with contact information for the care management team and has been advised to call with any health related questions or concerns.   Noreene Larsson, [place], [place], [place] [address] Direct Dial: [phone] [email or web address] Website: [email or web address]

## 2020-04-06 ENCOUNTER — Other Ambulatory Visit: Payer: Self-pay

## 2020-04-06 ENCOUNTER — Ambulatory Visit: Payer: Medicare Other | Admitting: *Deleted

## 2020-04-06 VITALS — BP 130/72 | HR 55

## 2020-04-06 DIAGNOSIS — Z0130 Encounter for examination of blood pressure without abnormal findings: Secondary | ICD-10-CM

## 2020-04-06 NOTE — Progress Notes (Signed)
Pt came in today for BP check. His Amlodipine was increased from 68m to 132m  1st reading was 149/79   Repeat check 10 minutes later was 130/72

## 2020-04-13 ENCOUNTER — Other Ambulatory Visit: Payer: Self-pay

## 2020-04-13 ENCOUNTER — Ambulatory Visit: Payer: Medicare Other | Attending: Neurosurgery | Admitting: Physical Therapy

## 2020-04-13 DIAGNOSIS — M6281 Muscle weakness (generalized): Secondary | ICD-10-CM | POA: Diagnosis present

## 2020-04-13 DIAGNOSIS — R262 Difficulty in walking, not elsewhere classified: Secondary | ICD-10-CM

## 2020-04-13 DIAGNOSIS — M5441 Lumbago with sciatica, right side: Secondary | ICD-10-CM | POA: Diagnosis present

## 2020-04-13 DIAGNOSIS — M5442 Lumbago with sciatica, left side: Secondary | ICD-10-CM | POA: Diagnosis not present

## 2020-04-13 DIAGNOSIS — G8929 Other chronic pain: Secondary | ICD-10-CM | POA: Diagnosis present

## 2020-04-13 NOTE — Patient Instructions (Signed)
Access Code: [email or web address] URL: [email or web address] Date: 04/13/2020 Prepared by: Kearney Hard  Exercises Seated Hamstring Stretch - 2 x daily - 7 x weekly - 3 reps - 30 seocnds hold Hooklying Single Knee to Chest Stretch - 2 x daily - 7 x weekly - 3 reps - 30 seconds hold Supine Lower Trunk Rotation - 2 x daily - 7 x weekly - 3 reps - 20 seconds hold

## 2020-04-13 NOTE — Therapy (Signed)
[facility] [place], [place], [address] Phone: [phone]   Fax:  [phone]  Physical Therapy Evaluation  Patient Details  Name: Dalton Vaughan MRN: [id number] Date of Birth: August 17, 1929 Referring Provider (PT): Erline Levine MD   Encounter Date: 04/13/2020  PT End of Session - 04/13/20 1043    Visit Number  13    Number of Visits  24    Date for PT Re-Evaluation  05/27/20    Authorization Type  Foto every 10 th visit, PN every 10th visit KX modifier after 15 visit    PT Start Time  1030    PT Stop Time  1115    PT Time Calculation (min)  45 min    Activity Tolerance  Patient tolerated treatment well    Behavior During Therapy  [facility] for tasks assessed/performed       Past Medical History:  Diagnosis Date  . Anal fissure   . Atrial fibrillation ([place])   . BPH (benign prostatic hypertrophy)   . CAD (coronary artery disease)    Dr. Wynonia Lawman  . Cataract   . COVID-19   . Depression 2004  . Diverticulosis   . GERD (gastroesophageal reflux disease)   . History of TIAs   . Hyperlipidemia   . Hypertension   . Hypertensive heart disease without CHF   . Hypothyroidism   . Internal hemorrhoids   . Lumbar disc disease   . Oral bleeding 08/20/2012   Following molar tooth extraction July, 2013  . Ruptured disk 1995  . Shingles   . Thyroid disease   . Vitamin D deficiency   . Vitreous detachment     Past Surgical History:  Procedure Laterality Date  . bilateral cataract surgery  6/07  . CARDIAC CATHETERIZATION    . CORONARY ARTERY BYPASS GRAFT  12/99    Dr. Servando Snare   . cysloscopy  8/08  . EYE SURGERY    . HERNIA REPAIR  1997   right  . herninated disc  1995  . PROSTATE BIOPSY  8/08  . TONSILLECTOMY AND ADENOIDECTOMY      There were no vitals filed for this visit.   Subjective Assessment - 04/13/20 1037    Subjective  COVID-19 screen performed prior to patient entering clinic.  Pt arriving to therapy for  evaluation of LBP that has been progressing since 2013. Pt reporting his pain started in the left hip and after multiple testing the MD discovered it was coming from his low back. Pt also reporting periods of R knee buckeling.    Pertinent History  Hernia repair, OA, DDD, CABG, hypothyroidism, H/o TIA's, HTN, R knee srugery    Patient Stated Goals  I would like to be able to work outside with pain, I have trouble walking and I want walk better.    Currently in Pain?  Yes    Pain Score  2     Pain Location  Back    Pain Orientation  Left    Pain Descriptors / Indicators  Aching    Pain Type  Chronic pain    Pain Onset  More than a month ago    Aggravating Factors   certain movements, I have pain the day following exercising    Pain Relieving Factors  resting         [facility] PT Assessment - 04/13/20 0001      Assessment   Medical Diagnosis  low back pain with bialteral sciatica  Referring Provider (PT)  Erline Levine MD    Hand Dominance  Right    Prior Therapy  for neck      Precautions   Precautions  None      Restrictions   Weight Bearing Restrictions  No      Balance Screen   Has the patient fallen in the past 6 months  No    Is the patient reluctant to leave their home because of a fear of falling?   No      Home Environment   Living Environment  Private residence    Living Arrangements  Alone    Type of [place] to enter    Entrance Stairs-Number of Steps  4    Entrance Stairs-Rails  Can reach both    [place]  One level      Cognition   Overall Cognitive Status  Within Functional Limits for tasks assessed      Observation/Other Assessments   Focus on Therapeutic Outcomes (FOTO)   55% limitation      Posture/Postural Control   Posture/Postural Control  Postural limitations    Postural Limitations  Rounded Shoulders;Forward head      ROM / Strength   AROM / PROM / Strength  AROM;Strength      AROM   AROM Assessment Site  Lumbar     Lumbar Extension  50   trouble keeping knees straight   Lumbar - Right Side Bend  30    Lumbar - Left Side Bend  20    Lumbar - Right Rotation  50% limitation    Lumbar - Left Rotation  50% limitation      Strength   Overall Strength Comments  R knee grossly 4/5, L knee 5/5    Strength Assessment Site  Hip    Right/Left Hip  Right;Left    Right Hip Flexion  4-/5    Right Hip ABduction  4-/5    Right Hip ADduction  4-/5    Left Hip Flexion  4/5    Left Hip ABduction  4/5    Left Hip ADduction  4/5      Palpation   Palpation comment  TTP L2-L5 lumbar paraspinals, bilateral piriformis      Transfers   Five time sit to stand comments   18.3 seconds       Standardized Balance Assessment   Standardized Balance Assessment  Berg Balance Test      Berg Balance Test   Sit to Stand  Able to stand without using hands and stabilize independently    Standing Unsupported  Able to stand safely 2 minutes    Sitting with Back Unsupported but Feet Supported on Floor or Stool  Able to sit safely and securely 2 minutes    Stand to Sit  Sits safely with minimal use of hands    Transfers  Able to transfer safely, minor use of hands    Standing Unsupported with Eyes Closed  Able to stand 10 seconds with supervision    Standing Unsupported with Feet Together  Able to place feet together independently and stand 1 minute safely    From Standing, Reach Forward with Outstretched Arm  Can reach forward >12 cm safely (5")    From Standing Position, Pick up Object from Floor  Able to pick up shoe safely and easily    From Standing Position, Turn to Look Behind Over each Shoulder  Looks behind one side only/other side shows less weight shift    Turn 360 Degrees  Able to turn 360 degrees safely one side only in 4 seconds or less    Standing Unsupported, Alternately Place Feet on Step/Stool  Able to stand independently and complete 8 steps >20 seconds    Standing Unsupported, One Foot in Front  Able to plae  foot ahead of the other independently and hold 30 seconds    Standing on One Leg  Able to lift leg independently and hold equal to or more than 3 seconds    Total Score  48                Objective measurements completed on examination: See above findings.              PT Education - 04/13/20 1042    Education Details  PT POC, HEP    Person(s) Educated  Patient    Methods  Explanation;Demonstration    Comprehension  Verbalized understanding;Returned demonstration;Need further instruction          PT Long Term Goals - 04/13/20 1045      PT LONG TERM GOAL #1   Title  Pt will be independent in his HEP and progression.    Time  6    Period  Weeks    Status  New      PT LONG TERM GOAL #2   Title  Pt will improve his bilateral LE strength to 5/5 in order to improve function.    Time  6    Period  Weeks    Status  New      PT LONG TERM GOAL #3   Title  Pt will be able to bend over and pick up 10 # object from floor to table height.    Time  6    Period  Weeks    Status  New      PT LONG TERM GOAL #4   Title  pt will be able to perform 1/2 kneeling to standing using single UE support on surface as needed.    Baseline  unable to perform due to pain.    Time  6    Period  Weeks    Status  New      PT LONG TERM GOAL #5   Title  Pt will be able to walk 20 minutes with no pain and decreased fear of falling.    Time  6    Period  Weeks    Status  New      Additional Long Term Goals   Additional Long Term Goals  Yes      PT LONG TERM GOAL #6   Title  Pt will improve his BERG balance score from 48/56 to >/= 52/56.    Time  6    Period  Weeks    Status  New             Plan - 04/13/20 1116    Clinical Impression Statement  Pt arriving to therpay for evaluation of chronic low back pain. Pt with limitations in trunk rotation and bilateral LE strength. Pt scored a 48/56 on the BERG balance assessment. Pt reporting pain radiation down bilateral  LE's and R knee buckeling at times when walking. Pt reporting difficulting with standing prolonged and walking longer distances. Pt with limitations in bilateral hanmstring strength. Skilled PT needed to progress pt toward his more safe and independent functional  mobilty.    Personal Factors and Comorbidities  Comorbidity 1;Comorbidity 2;Comorbidity 3+    Comorbidities  Hernia repair, OA, DDD, CABG, hypothyroidism, H/o TIA's, HTN, R knee surgery    Examination-Activity Limitations  Lift;Squat;Stand;Sit    Examination-Participation Restrictions  Yard Work    Stability/Clinical Decision Making  Stable/Uncomplicated    Clinical Decision Making  Low    Rehab Potential  Good    PT Frequency  2x / week    PT Duration  6 weeks    PT Treatment/Interventions  ADLs/Self Care Home Management;Cryotherapy;Electrical Stimulation;Ultrasound;Moist Heat;Therapeutic activities;Therapeutic exercise;Patient/family education;Passive range of motion;Traction;Gait training;Stair training;Functional mobility training;Balance training;Neuromuscular re-education;Manual techniques;Dry needling    PT Next Visit Plan  Nustep short warm up, Lumbar stretching, core strengtheing, LE strengtheing, assess mechanical traction, STM and modalities for pain.    PT Home Exercise Plan  Access Code: [email or web address]       Patient will benefit from skilled therapeutic intervention in order to improve the following deficits and impairments:  Pain, Decreased activity tolerance, Decreased range of motion, Postural dysfunction, Decreased strength, Difficulty walking  Visit Diagnosis: Chronic bilateral low back pain with bilateral sciatica  Difficulty in walking, not elsewhere classified  Muscle weakness (generalized)     Problem List Patient Active Problem List   Diagnosis Date Noted  . Acute respiratory failure with hypoxia ([place]) 09/16/2019  . Osteoarthritis of spine with radiculopathy, lumbar region 01/08/2018  . Thyroid disease   .  Aortic atherosclerosis ([place]) 05/02/2017  . Thrombocytopenia ([place]) 07/10/2016  . Hereditary and idiopathic peripheral neuropathy 10/16/2015  . Mononeuropathy 10/04/2015  . Allergic rhinitis 05/06/2014  . Depression 08/03/2013  . Sick sinus syndrome ([place]) 07/31/2012  . Hypertensive heart disease without CHF   . Chronic atrial fibrillation ([place])   . Coronary artery disease involving native coronary artery of native heart with angina pectoris ([place])   . History of TIAs   . Lumbar disc disease   . GERD (gastroesophageal reflux disease)   . Chronic anticoagulation   . Benign prostatic hyperplasia   . Hyperlipidemia     Oretha Caprice, MPT 04/13/2020, 11:37 AM  [facility] [place], [place], [address] Phone: [phone]   Fax:  [phone]  Name: Dalton Vaughan MRN: [id number] Date of Birth: [birth year]

## 2020-04-19 ENCOUNTER — Ambulatory Visit: Payer: Medicare Other | Attending: Neurosurgery | Admitting: Physical Therapy

## 2020-04-19 ENCOUNTER — Other Ambulatory Visit: Payer: Self-pay

## 2020-04-19 DIAGNOSIS — G8929 Other chronic pain: Secondary | ICD-10-CM | POA: Diagnosis present

## 2020-04-19 DIAGNOSIS — M5441 Lumbago with sciatica, right side: Secondary | ICD-10-CM | POA: Diagnosis present

## 2020-04-19 DIAGNOSIS — M542 Cervicalgia: Secondary | ICD-10-CM | POA: Insufficient documentation

## 2020-04-19 DIAGNOSIS — R262 Difficulty in walking, not elsewhere classified: Secondary | ICD-10-CM

## 2020-04-19 DIAGNOSIS — R293 Abnormal posture: Secondary | ICD-10-CM | POA: Diagnosis present

## 2020-04-19 DIAGNOSIS — M6281 Muscle weakness (generalized): Secondary | ICD-10-CM

## 2020-04-19 DIAGNOSIS — M5442 Lumbago with sciatica, left side: Secondary | ICD-10-CM | POA: Diagnosis not present

## 2020-04-19 NOTE — Therapy (Signed)
[facility] [place], [place], [address] Phone: [phone]   Fax:  [phone]  Physical Therapy Treatment  Patient Details  Name: Dalton Vaughan MRN: [id number] Date of Birth: 03/22/1929 Referring Provider (PT): Erline Levine MD   Encounter Date: 04/19/2020  PT End of Session - 04/19/20 1217    Visit Number  14    Number of Visits  24    Date for PT Re-Evaluation  05/27/20    Authorization Type  Foto every 10 th visit, PN every 10th visit KX modifier after 15 visit    PT Start Time  1030    PT Stop Time  1119    PT Time Calculation (min)  49 min    Activity Tolerance  Patient tolerated treatment well    Behavior During Therapy  [facility] for tasks assessed/performed       Past Medical History:  Diagnosis Date  . Anal fissure   . Atrial fibrillation ([place])   . BPH (benign prostatic hypertrophy)   . CAD (coronary artery disease)    Dr. Wynonia Lawman  . Cataract   . COVID-19   . Depression 2004  . Diverticulosis   . GERD (gastroesophageal reflux disease)   . History of TIAs   . Hyperlipidemia   . Hypertension   . Hypertensive heart disease without CHF   . Hypothyroidism   . Internal hemorrhoids   . Lumbar disc disease   . Oral bleeding 08/20/2012   Following molar tooth extraction July, 2013  . Ruptured disk 1995  . Shingles   . Thyroid disease   . Vitamin D deficiency   . Vitreous detachment     Past Surgical History:  Procedure Laterality Date  . bilateral cataract surgery  6/07  . CARDIAC CATHETERIZATION    . CORONARY ARTERY BYPASS GRAFT  12/99    Dr. Servando Snare   . cysloscopy  8/08  . EYE SURGERY    . HERNIA REPAIR  1997   right  . herninated disc  1995  . PROSTATE BIOPSY  8/08  . TONSILLECTOMY AND ADENOIDECTOMY      There were no vitals filed for this visit.  Subjective Assessment - 04/19/20 1210    Subjective  COVID-19 screen performed prior to patient entering clinic.  No new complaints.    Pertinent  History  Hernia repair, OA, DDD, CABG, hypothyroidism, H/o TIA's, HTN, R knee srugery    Currently in Pain?  Yes    Pain Score  2     Pain Location  Back    Pain Orientation  Left    Pain Descriptors / Indicators  Aching    Pain Type  Chronic pain    Pain Onset  More than a month ago                       [facility] Adult PT Treatment/Exercise - 04/19/20 0001      Exercises   Exercises  Lumbar;Knee/Hip      Lumbar Exercises: Machines for Strengthening   Cybex Lumbar Extension  40# x 3 minutes f/b ab machine with 40# x 3 minutes.      Knee/Hip Exercises: Aerobic   Nustep  Level 3 x 17 minutes.      Moist Heat Therapy   Number Minutes Moist Heat  20 Minutes    Moist Heat Location  Lumbar Spine      Electrical Stimulation   Electrical Stimulation Location  Lumbar.  Electrical Stimulation Action  IFC    Electrical Stimulation Parameters  80-150 Hz x 20 minutes.    Electrical Stimulation Goals  Tone;Pain                  PT Long Term Goals - 04/13/20 1045      PT LONG TERM GOAL #1   Title  Pt will be independent in his HEP and progression.    Time  6    Period  Weeks    Status  New      PT LONG TERM GOAL #2   Title  Pt will improve his bilateral LE strength to 5/5 in order to improve function.    Time  6    Period  Weeks    Status  New      PT LONG TERM GOAL #3   Title  Pt will be able to bend over and pick up 10 # object from floor to table height.    Time  6    Period  Weeks    Status  New      PT LONG TERM GOAL #4   Title  pt will be able to perform 1/2 kneeling to standing using single UE support on surface as needed.    Baseline  unable to perform due to pain.    Time  6    Period  Weeks    Status  New      PT LONG TERM GOAL #5   Title  Pt will be able to walk 20 minutes with no pain and decreased fear of falling.    Time  6    Period  Weeks    Status  New      Additional Long Term Goals   Additional Long Term Goals  Yes       PT LONG TERM GOAL #6   Title  Pt will improve his BERG balance score from 48/56 to >/= 52/56.    Time  6    Period  Weeks    Status  New            Plan - 04/19/20 1215    Clinical Impression Statement  Patient did great with ther ex today with excellent form and technique.    Personal Factors and Comorbidities  Comorbidity 1;Comorbidity 2;Comorbidity 3+    Comorbidities  Hernia repair, OA, DDD, CABG, hypothyroidism, H/o TIA's, HTN, R knee surgery    Examination-Activity Limitations  Lift;Squat;Stand;Sit    Examination-Participation Restrictions  Yard Work    Stability/Clinical Decision Making  Stable/Uncomplicated    Rehab Potential  Good    PT Frequency  2x / week    PT Duration  6 weeks    PT Treatment/Interventions  ADLs/Self Care Home Management;Cryotherapy;Electrical Stimulation;Ultrasound;Moist Heat;Therapeutic activities;Therapeutic exercise;Patient/family education;Passive range of motion;Traction;Gait training;Stair training;Functional mobility training;Balance training;Neuromuscular re-education;Manual techniques;Dry needling    PT Next Visit Plan  Nustep short warm up, Lumbar stretching, core strengtheing, LE strengtheing, assess mechanical traction, STM and modalities for pain.    PT Home Exercise Plan  Access Code: [email or web address]    [email or web address] and Agree with Plan of Care  Patient       Patient will benefit from skilled therapeutic intervention in order to improve the following deficits and impairments:  Pain, Decreased activity tolerance, Decreased range of motion, Postural dysfunction, Decreased strength, Difficulty walking  Visit Diagnosis: Chronic bilateral low back pain with bilateral sciatica  Difficulty in walking, not elsewhere classified  Muscle weakness (  generalized)     Problem List Patient Active Problem List   Diagnosis Date Noted  . Acute respiratory failure with hypoxia ([place]) 09/16/2019  . Osteoarthritis of spine with radiculopathy, lumbar region  01/08/2018  . Thyroid disease   . Aortic atherosclerosis ([place]) 05/02/2017  . Thrombocytopenia ([place]) 07/10/2016  . Hereditary and idiopathic peripheral neuropathy 10/16/2015  . Mononeuropathy 10/04/2015  . Allergic rhinitis 05/06/2014  . Depression 08/03/2013  . Sick sinus syndrome ([place]) 07/31/2012  . Hypertensive heart disease without CHF   . Chronic atrial fibrillation ([place])   . Coronary artery disease involving native coronary artery of native heart with angina pectoris ([place])   . History of TIAs   . Lumbar disc disease   . GERD (gastroesophageal reflux disease)   . Chronic anticoagulation   . Benign prostatic hyperplasia   . Hyperlipidemia     Luv Mish, [place] MPT 04/19/2020, 12:18 PM  [facility] [address] [place], [place], [address] Phone: [phone]   Fax:  [phone]  Name: Jaymen Fetch MRN: [id number] Date of Birth: 1929-06-11

## 2020-04-21 ENCOUNTER — Ambulatory Visit: Payer: Medicare Other | Admitting: Physical Therapy

## 2020-04-21 ENCOUNTER — Other Ambulatory Visit: Payer: Self-pay

## 2020-04-21 ENCOUNTER — Encounter: Payer: Self-pay | Admitting: Physical Therapy

## 2020-04-21 DIAGNOSIS — M6281 Muscle weakness (generalized): Secondary | ICD-10-CM

## 2020-04-21 DIAGNOSIS — M5442 Lumbago with sciatica, left side: Secondary | ICD-10-CM | POA: Diagnosis not present

## 2020-04-21 DIAGNOSIS — G8929 Other chronic pain: Secondary | ICD-10-CM

## 2020-04-21 DIAGNOSIS — R262 Difficulty in walking, not elsewhere classified: Secondary | ICD-10-CM

## 2020-04-21 NOTE — Therapy (Signed)
[facility] [place], [place], [address] Phone: [phone]   Fax:  [phone]  Physical Therapy Treatment  Patient Details  Name: Dalton Vaughan MRN: [id number] Date of Birth: Jan 28, 1929 Referring Provider (PT): Erline Levine MD   Encounter Date: 04/21/2020  PT End of Session - 04/21/20 1048    Visit Number  15    Number of Visits  24    Date for PT Re-Evaluation  05/27/20    Authorization Type  Foto every 10 th visit, PN every 10th visit KX modifier after 15 visit    PT Start Time  1030    PT Stop Time  1120    PT Time Calculation (min)  50 min    Activity Tolerance  Patient tolerated treatment well    Behavior During Therapy  [facility] for tasks assessed/performed       Past Medical History:  Diagnosis Date  . Anal fissure   . Atrial fibrillation ([place])   . BPH (benign prostatic hypertrophy)   . CAD (coronary artery disease)    Dr. Wynonia Lawman  . Cataract   . COVID-19   . Depression 2004  . Diverticulosis   . GERD (gastroesophageal reflux disease)   . History of TIAs   . Hyperlipidemia   . Hypertension   . Hypertensive heart disease without CHF   . Hypothyroidism   . Internal hemorrhoids   . Lumbar disc disease   . Oral bleeding 08/20/2012   Following molar tooth extraction July, 2013  . Ruptured disk 1995  . Shingles   . Thyroid disease   . Vitamin D deficiency   . Vitreous detachment     Past Surgical History:  Procedure Laterality Date  . bilateral cataract surgery  6/07  . CARDIAC CATHETERIZATION    . CORONARY ARTERY BYPASS GRAFT  12/99    Dr. Servando Snare   . cysloscopy  8/08  . EYE SURGERY    . HERNIA REPAIR  1997   right  . herninated disc  1995  . PROSTATE BIOPSY  8/08  . TONSILLECTOMY AND ADENOIDECTOMY      There were no vitals filed for this visit.  Subjective Assessment - 04/21/20 1041    Subjective  COVID-19 screen performed prior to patient entering clinic.  Stiffness in back today     Pertinent History  Hernia repair, OA, DDD, CABG, hypothyroidism, H/o TIA's, HTN, R knee srugery    Patient Stated Goals  I would like to be able to work outside with pain, I have trouble walking and I want walk better.    Currently in Pain?  Yes    Pain Score  2     Pain Location  Back    Pain Orientation  Left    Pain Descriptors / Indicators  Discomfort   "stiff"   Pain Type  Chronic pain    Pain Onset  More than a month ago    Pain Frequency  Intermittent    Aggravating Factors   prolong activity or certain movements    Pain Relieving Factors  rest                       [facility] Adult PT Treatment/Exercise - 04/21/20 0001      Lumbar Exercises: Machines for Strengthening   Cybex Lumbar Extension  40# x 3 minutes f/b ab machine with 40# x 3 minutes.      Knee/Hip Exercises: Aerobic   Nustep  L3 62mn  UE/LE, monitored      Knee/Hip Exercises: Machines for Strengthening   Cybex Knee Extension  10# x52mn    Cybex Knee Flexion  20# x270m      Knee/Hip Exercises: Standing   Heel Raises  Both;2 sets;10 reps    Forward Step Up  Both;2 sets;10 reps;Step Height: 2";Step Height: 6"    Other Standing Knee Exercises  green XTS for lat pull 2x20    Other Standing Knee Exercises  balance on airex x3m56m      Moist Heat Therapy   Number Minutes Moist Heat  15 Minutes    Moist Heat Location  Lumbar Spine      Electrical Stimulation   Electrical Stimulation Location  Lumbar.    Electrical Stimulation Action  IFC    Electrical Stimulation Parameters  80-150hz  x15m67m  Electrical Stimulation Goals  Tone;Pain                  PT Long Term Goals - 04/21/20 1047      PT LONG TERM GOAL #1   Title  Pt will be independent in his HEP and progression.    Time  6    Period  Weeks    Status  On-going      PT LONG TERM GOAL #2   Title  Pt will improve his bilateral LE strength to 5/5 in order to improve function.    Time  6    Period  Weeks    Status  On-going       PT LONG TERM GOAL #3   Title  Pt will be able to bend over and pick up 10 # object from floor to table height.    Time  6    Period  Weeks    Status  On-going      PT LONG TERM GOAL #4   Title  pt will be able to perform 1/2 kneeling to standing using single UE support on surface as needed.    Baseline  unable to perform due to pain.    Time  6    Period  Weeks    Status  On-going      PT LONG TERM GOAL #5   Title  Pt will be able to walk 20 minutes with no pain and decreased fear of falling.    Time  6    Period  Weeks    Status  On-going   pain with prolong walking per reported 04/21/20     PT LONG TERM GOAL #6   Title  Pt will improve his BERG balance score from 48/56 to >/= 52/56.    Time  6    Period  Weeks    Status  On-going            Plan - 04/21/20 1103    Clinical Impression Statement  Patient tolerated treatment well today. Patient able to progress with LE and back exercises today. Patient has reported no falls. Patient has minimal pain in back today that increases with prolong walking. Patient responded well to modalities. Goals ongoing.    Personal Factors and Comorbidities  Comorbidity 1;Comorbidity 2;Comorbidity 3+    Comorbidities  Hernia repair, OA, DDD, CABG, hypothyroidism, H/o TIA's, HTN, R knee surgery    Examination-Activity Limitations  Lift;Squat;Stand;Sit    Examination-Participation Restrictions  Yard Work    Stability/Clinical Decision Making  Stable/Uncomplicated    Rehab Potential  Good    PT Frequency  2x / week    PT Duration  6 weeks    PT Treatment/Interventions  ADLs/Self Care Home Management;Cryotherapy;Electrical Stimulation;Ultrasound;Moist Heat;Therapeutic activities;Therapeutic exercise;Patient/family education;Passive range of motion;Traction;Gait training;Stair training;Functional mobility training;Balance training;Neuromuscular re-education;Manual techniques;Dry needling    PT Next Visit Plan  cont with POC for Lumbar  stretching, core strengtheing, LE strengtheing, assess mechanical traction, STM and modalities for pain.    Consulted and Agree with Plan of Care  Patient       Patient will benefit from skilled therapeutic intervention in order to improve the following deficits and impairments:  Pain, Decreased activity tolerance, Decreased range of motion, Postural dysfunction, Decreased strength, Difficulty walking  Visit Diagnosis: Chronic bilateral low back pain with bilateral sciatica  Difficulty in walking, not elsewhere classified  Muscle weakness (generalized)     Problem List Patient Active Problem List   Diagnosis Date Noted  . Acute respiratory failure with hypoxia ([place]) 09/16/2019  . Osteoarthritis of spine with radiculopathy, lumbar region 01/08/2018  . Thyroid disease   . Aortic atherosclerosis ([place]) 05/02/2017  . Thrombocytopenia ([place]) 07/10/2016  . Hereditary and idiopathic peripheral neuropathy 10/16/2015  . Mononeuropathy 10/04/2015  . Allergic rhinitis 05/06/2014  . Depression 08/03/2013  . Sick sinus syndrome ([place]) 07/31/2012  . Hypertensive heart disease without CHF   . Chronic atrial fibrillation ([place])   . Coronary artery disease involving native coronary artery of native heart with angina pectoris ([place])   . History of TIAs   . Lumbar disc disease   . GERD (gastroesophageal reflux disease)   . Chronic anticoagulation   . Benign prostatic hyperplasia   . Hyperlipidemia     Tonita Bills P, PTA 04/21/2020, 11:26 AM  [facility] [place], [place], [address] Phone: [phone]   Fax:  [phone]  Name: Boleslaus Holloway MRN: [id number] Date of Birth: 06-22-1929

## 2020-04-25 ENCOUNTER — Other Ambulatory Visit: Payer: Self-pay

## 2020-04-25 ENCOUNTER — Encounter: Payer: Self-pay | Admitting: Physical Therapy

## 2020-04-25 ENCOUNTER — Ambulatory Visit: Payer: Medicare Other | Admitting: Physical Therapy

## 2020-04-25 DIAGNOSIS — R262 Difficulty in walking, not elsewhere classified: Secondary | ICD-10-CM

## 2020-04-25 DIAGNOSIS — M6281 Muscle weakness (generalized): Secondary | ICD-10-CM

## 2020-04-25 DIAGNOSIS — M5442 Lumbago with sciatica, left side: Secondary | ICD-10-CM | POA: Diagnosis not present

## 2020-04-25 DIAGNOSIS — G8929 Other chronic pain: Secondary | ICD-10-CM

## 2020-04-25 NOTE — Therapy (Signed)
[place], [place], [address] Phone: [phone]   Fax:  [phone]  Physical Therapy Treatment  Patient Details  Name: Dalton Vaughan MRN: [id number] Date of Birth: 1929-05-27 Referring Provider (PT): Erline Levine MD   Encounter Date: 04/25/2020  PT End of Session - 04/25/20 1038    Visit Number  16    Number of Visits  24    Date for PT Re-Evaluation  05/27/20    Authorization Type  Foto 16th visit 44 % limitation , PN every 10th visit KX modifier after 15 visit    PT Start Time  1032    PT Stop Time  1123    PT Time Calculation (min)  51 min    Activity Tolerance  Patient tolerated treatment well    Behavior During Therapy  [facility] for tasks assessed/performed       Past Medical History:  Diagnosis Date  . Anal fissure   . Atrial fibrillation ([place])   . BPH (benign prostatic hypertrophy)   . CAD (coronary artery disease)    Dr. Wynonia Lawman  . Cataract   . COVID-19   . Depression 2004  . Diverticulosis   . GERD (gastroesophageal reflux disease)   . History of TIAs   . Hyperlipidemia   . Hypertension   . Hypertensive heart disease without CHF   . Hypothyroidism   . Internal hemorrhoids   . Lumbar disc disease   . Oral bleeding 08/20/2012   Following molar tooth extraction July, 2013  . Ruptured disk 1995  . Shingles   . Thyroid disease   . Vitamin D deficiency   . Vitreous detachment     Past Surgical History:  Procedure Laterality Date  . bilateral cataract surgery  6/07  . CARDIAC CATHETERIZATION    . CORONARY ARTERY BYPASS GRAFT  12/99    Dr. Servando Snare   . cysloscopy  8/08  . EYE SURGERY    . HERNIA REPAIR  1997   right  . herninated disc  1995  . PROSTATE BIOPSY  8/08  . TONSILLECTOMY AND ADENOIDECTOMY      There were no vitals filed for this visit.  Subjective Assessment - 04/25/20 1035    Subjective  COVID-19 screen performed prior to patient entering clinic.  Patient arrived with  ongoing pain    Pertinent History  Hernia repair, OA, DDD, CABG, hypothyroidism, H/o TIA's, HTN, R knee srugery    Patient Stated Goals  I would like to be able to work outside with pain, I have trouble walking and I want walk better.    Currently in Pain?  Yes    Pain Score  3     Pain Location  Back    Pain Orientation  Left    Pain Descriptors / Indicators  Discomfort    Pain Type  Chronic pain    Pain Onset  More than a month ago    Pain Frequency  Intermittent    Aggravating Factors   prolong activity    Pain Relieving Factors  at rest                       [facility] Adult PT Treatment/Exercise - 04/25/20 0001      Lumbar Exercises: Machines for Strengthening   Cybex Lumbar Extension  40# x 3 minutes f/b ab machine with 40# x 3 minutes.      Lumbar Exercises: Standing   Other Standing Lumbar Exercises  red swiss ball for lift and lower waist to floor /power lift x10    Other Standing Lumbar Exercises  kneeling half range to floor x 5      Knee/Hip Exercises: Aerobic   Nustep  L4 x69mn UE/LE activity, monitored      Knee/Hip Exercises: Machines for Strengthening   Cybex Knee Extension  10# x279m    Cybex Knee Flexion  20# x2m49m     Knee/Hip Exercises: Standing   Other Standing Knee Exercises  green XTS for lat pull 2x20      Moist Heat Therapy   Number Minutes Moist Heat  15 Minutes    Moist Heat Location  Lumbar Spine      Electrical Stimulation   Electrical Stimulation Location  Lumbar.    Electrical Stimulation Action  IFC    Electrical Stimulation Parameters  80-150hz  x15m42m  Electrical Stimulation Goals  Tone;Pain                  PT Long Term Goals - 04/21/20 1047      PT LONG TERM GOAL #1   Title  Pt will be independent in his HEP and progression.    Time  6    Period  Weeks    Status  On-going      PT LONG TERM GOAL #2   Title  Pt will improve his bilateral LE strength to 5/5 in order to improve function.    Time  6     Period  Weeks    Status  On-going      PT LONG TERM GOAL #3   Title  Pt will be able to bend over and pick up 10 # object from floor to table height.    Time  6    Period  Weeks    Status  On-going      PT LONG TERM GOAL #4   Title  pt will be able to perform 1/2 kneeling to standing using single UE support on surface as needed.    Baseline  unable to perform due to pain.    Time  6    Period  Weeks    Status  On-going      PT LONG TERM GOAL #5   Title  Pt will be able to walk 20 minutes with no pain and decreased fear of falling.    Time  6    Period  Weeks    Status  On-going   pain with prolong walking per reported 04/21/20     PT LONG TERM GOAL #6   Title  Pt will improve his BERG balance score from 48/56 to >/= 52/56.    Time  6    Period  Weeks    Status  On-going            Plan - 04/25/20 1059    Clinical Impression Statement  Patient tolerated treatment well today. Patient has reported more knee pain than back pain at times. Patient able to progress exercises today and started power lift technique to help patient to lift things off floor with good technique to protect back. Patient able to perform his ADL's with greater ease yet limitations due to knee pain. Patient goals progressing due to limitations.    Personal Factors and Comorbidities  Comorbidity 1;Comorbidity 2;Comorbidity 3+    Comorbidities  Hernia repair, OA, DDD, CABG, hypothyroidism, H/o TIA's, HTN, R knee surgery    Examination-Activity Limitations  Lift;Squat;Stand;Sit    Examination-Participation Restrictions  Yard Work    Stability/Clinical Decision Making  Stable/Uncomplicated    Rehab Potential  Good    PT Frequency  2x / week    PT Duration  6 weeks    PT Treatment/Interventions  ADLs/Self Care Home Management;Cryotherapy;Electrical Stimulation;Ultrasound;Moist Heat;Therapeutic activities;Therapeutic exercise;Patient/family education;Passive range of motion;Traction;Gait training;Stair  training;Functional mobility training;Balance training;Neuromuscular re-education;Manual techniques;Dry needling    PT Next Visit Plan  cont with POC for Lumbar stretching, core strengtheing, LE strengtheing,  STM and modalities for pain.    Consulted and Agree with Plan of Care  Patient       Patient will benefit from skilled therapeutic intervention in order to improve the following deficits and impairments:  Pain, Decreased activity tolerance, Decreased range of motion, Postural dysfunction, Decreased strength, Difficulty walking  Visit Diagnosis: Chronic bilateral low back pain with bilateral sciatica  Difficulty in walking, not elsewhere classified  Muscle weakness (generalized)     Problem List Patient Active Problem List   Diagnosis Date Noted  . Acute respiratory failure with hypoxia ([place]) 09/16/2019  . Osteoarthritis of spine with radiculopathy, lumbar region 01/08/2018  . Thyroid disease   . Aortic atherosclerosis ([place]) 05/02/2017  . Thrombocytopenia ([place]) 07/10/2016  . Hereditary and idiopathic peripheral neuropathy 10/16/2015  . Mononeuropathy 10/04/2015  . Allergic rhinitis 05/06/2014  . Depression 08/03/2013  . Sick sinus syndrome ([place]) 07/31/2012  . Hypertensive heart disease without CHF   . Chronic atrial fibrillation ([place])   . Coronary artery disease involving native coronary artery of native heart with angina pectoris ([place])   . History of TIAs   . Lumbar disc disease   . GERD (gastroesophageal reflux disease)   . Chronic anticoagulation   . Benign prostatic hyperplasia   . Hyperlipidemia     Delayza Lungren P, PTA 04/25/2020, 11:30 AM  [facility] [place], [place], [address] Phone: [phone]   Fax:  [phone]  Name: Meet Weathington MRN: [id number] Date of Birth: Oct 01, 1929

## 2020-04-28 ENCOUNTER — Other Ambulatory Visit: Payer: Self-pay

## 2020-04-28 ENCOUNTER — Ambulatory Visit: Payer: Medicare Other | Admitting: Physical Therapy

## 2020-04-28 DIAGNOSIS — R262 Difficulty in walking, not elsewhere classified: Secondary | ICD-10-CM

## 2020-04-28 DIAGNOSIS — G8929 Other chronic pain: Secondary | ICD-10-CM

## 2020-04-28 DIAGNOSIS — M5441 Lumbago with sciatica, right side: Secondary | ICD-10-CM

## 2020-04-28 DIAGNOSIS — M5442 Lumbago with sciatica, left side: Secondary | ICD-10-CM | POA: Diagnosis not present

## 2020-04-28 NOTE — Therapy (Signed)
[facility] [place], [place], [address] Phone: [phone]   Fax:  [phone]  Physical Therapy Treatment  Patient Details  Name: Dalton Vaughan MRN: [id number] Date of Birth: 12/04/1929 Referring Provider (PT): Erline Levine MD   Encounter Date: 04/28/2020  PT End of Session - 04/28/20 1138    Visit Number  17    Number of Visits  24    Date for PT Re-Evaluation  05/27/20    Authorization Type  Foto 16th visit 44 % limitation , PN every 10th visit KX modifier after 15 visit    PT Start Time  1030    PT Stop Time  1124    PT Time Calculation (min)  54 min    Activity Tolerance  Patient tolerated treatment well    Behavior During Therapy  [facility] for tasks assessed/performed       Past Medical History:  Diagnosis Date  . Anal fissure   . Atrial fibrillation ([place])   . BPH (benign prostatic hypertrophy)   . CAD (coronary artery disease)    Dr. Wynonia Lawman  . Cataract   . COVID-19   . Depression 2004  . Diverticulosis   . GERD (gastroesophageal reflux disease)   . History of TIAs   . Hyperlipidemia   . Hypertension   . Hypertensive heart disease without CHF   . Hypothyroidism   . Internal hemorrhoids   . Lumbar disc disease   . Oral bleeding 08/20/2012   Following molar tooth extraction July, 2013  . Ruptured disk 1995  . Shingles   . Thyroid disease   . Vitamin D deficiency   . Vitreous detachment     Past Surgical History:  Procedure Laterality Date  . bilateral cataract surgery  6/07  . CARDIAC CATHETERIZATION    . CORONARY ARTERY BYPASS GRAFT  12/99    Dr. Servando Snare   . cysloscopy  8/08  . EYE SURGERY    . HERNIA REPAIR  1997   right  . herninated disc  1995  . PROSTATE BIOPSY  8/08  . TONSILLECTOMY AND ADENOIDECTOMY      There were no vitals filed for this visit.  Subjective Assessment - 04/28/20 1121    Subjective  COVID-19 screen performed prior to patient entering clinic.  Pain about a 2.    Pertinent History  Hernia repair, OA, DDD, CABG, hypothyroidism, H/o TIA's, HTN, R knee srugery    Patient Stated Goals  I would like to be able to work outside with pain, I have trouble walking and I want walk better.    Currently in Pain?  Yes    Pain Score  2     Pain Location  Back    Pain Orientation  Left    Pain Descriptors / Indicators  Discomfort    Pain Type  Chronic pain    Pain Onset  More than a month ago                        [facility] Adult PT Treatment/Exercise - 04/28/20 0001      Exercises   Exercises  Knee/Hip      Lumbar Exercises: Machines for Strengthening   Cybex Lumbar Extension  50# x 3 minutes.    Other Lumbar Machine Exercise  ab machine x 3 minutes.      Lumbar Exercises: Supine   AB Set Limitations  Mini-crunches to fatigue x 2.    Other Supine Lumbar  Exercises  Hip bridges 2 sets x 20 reps.    Other Supine Lumbar Exercises  Bilateral trunk rotation.      Modalities   Modalities  Electrical Stimulation;Moist Heat      Moist Heat Therapy   Number Minutes Moist Heat  20 Minutes    Moist Heat Location  Lumbar Spine      Electrical Stimulation   Electrical Stimulation Location  Lumbar.    Electrical Stimulation Action  IFC    Electrical Stimulation Parameters  80-150 Hz x 20 minutes.    Electrical Stimulation Goals  Tone;Pain                  PT Long Term Goals - 04/21/20 1047      PT LONG TERM GOAL #1   Title  Pt will be independent in his HEP and progression.    Time  6    Period  Weeks    Status  On-going      PT LONG TERM GOAL #2   Title  Pt will improve his bilateral LE strength to 5/5 in order to improve function.    Time  6    Period  Weeks    Status  On-going      PT LONG TERM GOAL #3   Title  Pt will be able to bend over and pick up 10 # object from floor to table height.    Time  6    Period  Weeks    Status  On-going      PT LONG TERM GOAL #4   Title  pt will be able to perform 1/2 kneeling to  standing using single UE support on surface as needed.    Baseline  unable to perform due to pain.    Time  6    Period  Weeks    Status  On-going      PT LONG TERM GOAL #5   Title  Pt will be able to walk 20 minutes with no pain and decreased fear of falling.    Time  6    Period  Weeks    Status  On-going   pain with prolong walking per reported 04/21/20     PT LONG TERM GOAL #6   Title  Pt will improve his BERG balance score from 48/56 to >/= 52/56.    Time  6    Period  Weeks    Status  On-going            Plan - 04/28/20 1140    Clinical Impression Statement  The patient did very well with exercise progression with increase resistance of ab/back machine and mat exercises.    Personal Factors and Comorbidities  Comorbidity 1;Comorbidity 2;Comorbidity 3+    Comorbidities  Hernia repair, OA, DDD, CABG, hypothyroidism, H/o TIA's, HTN, R knee surgery    Examination-Activity Limitations  Lift;Squat;Stand;Sit    Examination-Participation Restrictions  Yard Work    Stability/Clinical Decision Making  Stable/Uncomplicated    Rehab Potential  Good    PT Frequency  2x / week    PT Duration  6 weeks    PT Treatment/Interventions  ADLs/Self Care Home Management;Cryotherapy;Electrical Stimulation;Ultrasound;Moist Heat;Therapeutic activities;Therapeutic exercise;Patient/family education;Passive range of motion;Traction;Gait training;Stair training;Functional mobility training;Balance training;Neuromuscular re-education;Manual techniques;Dry needling    PT Next Visit Plan  cont with POC for Lumbar stretching, core strengtheing, LE strengtheing,  STM and modalities for pain.    PT Home Exercise Plan  Access Code: [email or web address]  Consulted and Agree with Plan of Care  Patient       Patient will benefit from skilled therapeutic intervention in order to improve the following deficits and impairments:  Pain, Decreased activity tolerance, Decreased range of motion, Postural dysfunction, Decreased  strength, Difficulty walking  Visit Diagnosis: Chronic bilateral low back pain with bilateral sciatica  Difficulty in walking, not elsewhere classified     Problem List Patient Active Problem List   Diagnosis Date Noted  . Acute respiratory failure with hypoxia ([place]) 09/16/2019  . Osteoarthritis of spine with radiculopathy, lumbar region 01/08/2018  . Thyroid disease   . Aortic atherosclerosis ([place]) 05/02/2017  . Thrombocytopenia ([place]) 07/10/2016  . Hereditary and idiopathic peripheral neuropathy 10/16/2015  . Mononeuropathy 10/04/2015  . Allergic rhinitis 05/06/2014  . Depression 08/03/2013  . Sick sinus syndrome ([place]) 07/31/2012  . Hypertensive heart disease without CHF   . Chronic atrial fibrillation ([place])   . Coronary artery disease involving native coronary artery of native heart with angina pectoris ([place])   . History of TIAs   . Lumbar disc disease   . GERD (gastroesophageal reflux disease)   . Chronic anticoagulation   . Benign prostatic hyperplasia   . Hyperlipidemia     , [place] MPT 04/28/2020, 11:46 AM  [facility] [place], [place], [address] Phone: [phone]   Fax:  [phone]  Name: Dalton Vaughan MRN: [id number] Date of Birth: 11/15/1929

## 2020-05-02 ENCOUNTER — Ambulatory Visit: Payer: Medicare Other | Admitting: Physical Therapy

## 2020-05-02 ENCOUNTER — Other Ambulatory Visit: Payer: Self-pay

## 2020-05-02 DIAGNOSIS — M5442 Lumbago with sciatica, left side: Secondary | ICD-10-CM | POA: Diagnosis not present

## 2020-05-02 DIAGNOSIS — G8929 Other chronic pain: Secondary | ICD-10-CM

## 2020-05-02 DIAGNOSIS — M6281 Muscle weakness (generalized): Secondary | ICD-10-CM

## 2020-05-02 DIAGNOSIS — R262 Difficulty in walking, not elsewhere classified: Secondary | ICD-10-CM

## 2020-05-02 NOTE — Therapy (Signed)
[facility], [place], [address] Phone: [phone]   Fax:  [phone]  Physical Therapy Treatment  Patient Details  Name: Dalton Vaughan MRN: [id number] Date of Birth: 09/27/1929 Referring Provider (PT): Erline Levine MD   Encounter Date: 05/02/2020  PT End of Session - 05/02/20 1108    Visit Number  18    Number of Visits  24    Date for PT Re-Evaluation  05/27/20    Authorization Type  [employer]oto 16th visit 44 % limitation , PN every 10th visit KX modifier after 15 visit    PT Start Time  1029    PT Stop Time  1119    PT Time Calculation (min)  50 min    Activity Tolerance  Patient tolerated treatment well    Behavior During Therapy  [facility] for tasks assessed/performed       Past Medical History:  Diagnosis Date  . Anal fissure   . Atrial fibrillation ([place])   . BPH (benign prostatic hypertrophy)   . CAD (coronary artery disease)    Dr. Wynonia Lawman  . Cataract   . COVID-19   . Depression 2004  . Diverticulosis   . GERD (gastroesophageal reflux disease)   . History of TIAs   . Hyperlipidemia   . Hypertension   . Hypertensive heart disease without CHF   . Hypothyroidism   . Internal hemorrhoids   . Lumbar disc disease   . Oral bleeding 08/20/2012   Following molar tooth extraction July, 2013  . Ruptured disk 1995  . Shingles   . Thyroid disease   . Vitamin D deficiency   . Vitreous detachment     Past Surgical History:  Procedure Laterality Date  . bilateral cataract surgery  6/07  . CARDIAC CATHETERIZATION    . CORONARY ARTERY BYPASS GRAFT  12/99    Dr. Servando Snare   . cysloscopy  8/08  . EYE SURGERY    . HERNIA REPAIR  1997   right  . herninated disc  1995  . PROSTATE BIOPSY  8/08  . TONSILLECTOMY AND ADENOIDECTOMY      There were no vitals filed for this visit.  Subjective Assessment - 05/02/20 1035    Subjective  COVID-19 screen performed prior to patient entering clinic.  Patient reported ongoing  knee and back pain    Pertinent History  Hernia repair, OA, DDD, CABG, hypothyroidism, H/o TIA's, HTN, R knee srugery    Patient Stated Goals  I would like to be able to work outside with pain, I have trouble walking and I want walk better.    Currently in Pain?  Yes    Pain Score  2     Pain Location  Back    Pain Orientation  Left    Pain Descriptors / Indicators  Discomfort    Pain Type  Chronic pain    Pain Onset  More than a month ago    Pain Frequency  Intermittent    Aggravating Factors   prolong activity    Pain Relieving Factors  rest         [facility] PT Assessment - 05/02/20 0001      Berg Balance Test   Sit to Stand  Able to stand without using hands and stabilize independently    Standing Unsupported  Able to stand safely 2 minutes    Sitting with Back Unsupported but Feet Supported on Floor or Stool  Able to sit safely and securely 2  minutes    Stand to Sit  Sits safely with minimal use of hands    Transfers  Able to transfer safely, minor use of hands    Standing Unsupported with Eyes Closed  Able to stand 10 seconds safely    Standing Unsupported with Feet Together  Able to place feet together independently and stand 1 minute safely    From Standing, Reach Forward with Outstretched Arm  Can reach confidently >25 cm (10")    From Standing Position, Pick up Object from Floor  Able to pick up shoe safely and easily    From Standing Position, Turn to Look Behind Over each Shoulder  Looks behind from both sides and weight shifts well    Turn 360 Degrees  Able to turn 360 degrees safely in 4 seconds or less    Standing Unsupported, Alternately Place Feet on Step/Stool  Able to stand independently and complete 8 steps >20 seconds    Standing Unsupported, One Foot in Front  Able to plae foot ahead of the other independently and hold 30 seconds    Standing on One Leg  Able to lift leg independently and hold equal to or more than 3 seconds    Total Score  52                     [facility] Adult PT Treatment/Exercise - 05/02/20 0001      Lumbar Exercises: Machines for Strengthening   Cybex Lumbar Extension  50# x 3 minutes.    Other Lumbar Machine Exercise  50# ab machine x 3 minutes.      Lumbar Exercises: Standing   Other Standing Lumbar Exercises  kneeling half range to floor uni UE support x 5      Lumbar Exercises: Supine   AB Set Limitations  Mini-crunches to fatigue x 20    Bridge with clamshell  20 reps   red band     Knee/Hip Exercises: Aerobic   Nustep  L4-5 x10mn UE/LE activity, monitored      Knee/Hip Exercises: Standing   Other Standing Knee Exercises  green XTS for lat pull and diagnols  2x10 each      Moist Heat Therapy   Number Minutes Moist Heat  15 Minutes    Moist Heat Location  Lumbar Spine      Electrical Stimulation   Electrical Stimulation Location  Lumbar.    Electrical Stimulation Action  IFC    Electrical Stimulation Parameters  80-150hz  x140m    Electrical Stimulation Goals  Tone;Pain                  PT Long Term Goals - 05/02/20 1047      PT LONG TERM GOAL #1   Title  Pt will be independent in his HEP and progression.    Time  6    Period  Weeks    Status  On-going      PT LONG TERM GOAL #2   Title  Pt will improve his bilateral LE strength to 5/5 in order to improve function.    Time  6    Period  Weeks    Status  On-going      PT LONG TERM GOAL #3   Title  Pt will be able to bend over and pick up 10 # object from floor to table height.    Baseline  Met 05/02/20    Time  6    Period  Weeks  Status  Achieved   able to perfrom with no difficulty per reported today 05/02/20     PT LONG TERM GOAL #4   Title  pt will be able to perform 1/2 kneeling to standing using single UE support on surface as needed.    Baseline  met 05/02/20    Time  6    Period  Weeks    Status  Achieved      PT LONG TERM GOAL #5   Title  Pt will be able to walk 20 minutes with no pain and  decreased fear of falling.    Time  6    Period  Weeks    Status  Partially Met   "little pain" yet able to walk 20 min 05/02/20     PT LONG TERM GOAL #6   Title  Pt will improve his BERG balance score from 48/56 to >/= 52/56.    Baseline  met 05/02/20    Time  6    Period  Weeks    Status  Achieved   52/56 05/02/20           Plan - 05/02/20 1109    Clinical Impression Statement  Patient tolerated treatment well today. Patient able to walk for 20 min yet some pain reported. Patient is able to lift about 10# with no difficulty and perform half kneeling with no difficulty. Patient has improved BERG score to 52/56. Patient has ongoing pain in knee and back. Patient met LTG #3, #4 and #6 today with others progressing.    Personal Factors and Comorbidities  Comorbidity 1;Comorbidity 2;Comorbidity 3+    Comorbidities  Hernia repair, OA, DDD, CABG, hypothyroidism, H/o TIA's, HTN, R knee surgery    Examination-Activity Limitations  Lift;Squat;Stand;Sit    Examination-Participation Restrictions  Yard Work    Stability/Clinical Decision Making  Stable/Uncomplicated    Rehab Potential  Good    PT Frequency  2x / week    PT Duration  6 weeks    PT Treatment/Interventions  ADLs/Self Care Home Management;Cryotherapy;Electrical Stimulation;Ultrasound;Moist Heat;Therapeutic activities;Therapeutic exercise;Patient/family education;Passive range of motion;Traction;Gait training;Stair training;Functional mobility training;Balance training;Neuromuscular re-education;Manual techniques;Dry needling    PT Next Visit Plan  cont with POC for Lumbar stretching, core strengtheing, LE strengtheing,  STM and modalities for pain.    Consulted and Agree with Plan of Care  Patient       Patient will benefit from skilled therapeutic intervention in order to improve the following deficits and impairments:  Pain, Decreased activity tolerance, Decreased range of motion, Postural dysfunction, Decreased strength,  Difficulty walking  Visit Diagnosis: Chronic bilateral low back pain with bilateral sciatica  Difficulty in walking, not elsewhere classified  Muscle weakness (generalized)     Problem List Patient Active Problem List   Diagnosis Date Noted  . Acute respiratory failure with hypoxia ([place]) 09/16/2019  . Osteoarthritis of spine with radiculopathy, lumbar region 01/08/2018  . Thyroid disease   . Aortic atherosclerosis ([place]) 05/02/2017  . Thrombocytopenia ([place]) 07/10/2016  . Hereditary and idiopathic peripheral neuropathy 10/16/2015  . Mononeuropathy 10/04/2015  . Allergic rhinitis 05/06/2014  . Depression 08/03/2013  . Sick sinus syndrome ([place]) 07/31/2012  . Hypertensive heart disease without CHF   . Chronic atrial fibrillation ([place])   . Coronary artery disease involving native coronary artery of native heart with angina pectoris ([place])   . History of TIAs   . Lumbar disc disease   . GERD (gastroesophageal reflux disease)   . Chronic anticoagulation   .  Benign prostatic hyperplasia   . Hyperlipidemia     Dalton Vaughan P, PTA 05/02/2020, 11:22 AM  [facility] [place], [place], [address] Phone: [phone]   Fax:  [phone]  Name: Broderick Fonseca MRN: [id number] Date of Birth: February 24, 1929

## 2020-05-05 ENCOUNTER — Other Ambulatory Visit: Payer: Self-pay

## 2020-05-05 ENCOUNTER — Ambulatory Visit: Payer: Medicare Other | Admitting: Physical Therapy

## 2020-05-05 DIAGNOSIS — M542 Cervicalgia: Secondary | ICD-10-CM

## 2020-05-05 DIAGNOSIS — G8929 Other chronic pain: Secondary | ICD-10-CM

## 2020-05-05 DIAGNOSIS — R262 Difficulty in walking, not elsewhere classified: Secondary | ICD-10-CM

## 2020-05-05 DIAGNOSIS — M6281 Muscle weakness (generalized): Secondary | ICD-10-CM

## 2020-05-05 DIAGNOSIS — M5442 Lumbago with sciatica, left side: Secondary | ICD-10-CM

## 2020-05-05 DIAGNOSIS — R293 Abnormal posture: Secondary | ICD-10-CM

## 2020-05-05 NOTE — Therapy (Signed)
[facility] [place], [place], [address] Phone: [phone]   Fax:  [phone]  Physical Therapy Treatment  Patient Details  Name: Dalton Vaughan MRN: [id number] Date of Birth: March 14, 1929 Referring Provider (PT): Erline Levine MD   Encounter Date: 05/05/2020  PT End of Session - 05/05/20 1126    Visit Number  19    Number of Visits  24    Date for PT Re-Evaluation  05/27/20    Authorization Type  Foto 16th visit 44 % limitation , PN every 10th visit KX modifier after 15 visit    PT Start Time  1030    PT Stop Time  1120    PT Time Calculation (min)  50 min    Activity Tolerance  Patient tolerated treatment well    Behavior During Therapy  [facility] for tasks assessed/performed       Past Medical History:  Diagnosis Date  . Anal fissure   . Atrial fibrillation ([place])   . BPH (benign prostatic hypertrophy)   . CAD (coronary artery disease)    Dr. Wynonia Lawman  . Cataract   . COVID-19   . Depression 2004  . Diverticulosis   . GERD (gastroesophageal reflux disease)   . History of TIAs   . Hyperlipidemia   . Hypertension   . Hypertensive heart disease without CHF   . Hypothyroidism   . Internal hemorrhoids   . Lumbar disc disease   . Oral bleeding 08/20/2012   Following molar tooth extraction July, 2013  . Ruptured disk 1995  . Shingles   . Thyroid disease   . Vitamin D deficiency   . Vitreous detachment     Past Surgical History:  Procedure Laterality Date  . bilateral cataract surgery  6/07  . CARDIAC CATHETERIZATION    . CORONARY ARTERY BYPASS GRAFT  12/99    Dr. Servando Snare   . cysloscopy  8/08  . EYE SURGERY    . HERNIA REPAIR  1997   right  . herninated disc  1995  . PROSTATE BIOPSY  8/08  . TONSILLECTOMY AND ADENOIDECTOMY      There were no vitals filed for this visit.  Subjective Assessment - 05/05/20 1124    Subjective  COVID-19 screen performed prior to patient entering clinic.  Pt reporting, "I think I  have improved a little".    Pertinent History  Hernia repair, OA, DDD, CABG, hypothyroidism, H/o TIA's, HTN, R knee srugery    Patient Stated Goals  I would like to be able to work outside with pain, I have trouble walking and I want walk better.    Currently in Pain?  Yes    Pain Score  3     Pain Location  Back    Pain Orientation  Lower    Pain Descriptors / Indicators  Aching;Discomfort    Pain Type  Chronic pain    Pain Onset  More than a month ago    Pain Frequency  Intermittent                        [facility] Adult PT Treatment/Exercise - 05/05/20 0001      Lumbar Exercises: Stretches   Active Hamstring Stretch  3 reps;30 seconds    Lower Trunk Rotation  2 reps;20 seconds    Piriformis Stretch  Right;Left;3 reps;30 seconds      Lumbar Exercises: Supine   AB Set Limitations  Mini-crunches to fatigue x 20  Bridge with clamshell  20 reps   red band   Other Supine Lumbar Exercises  isometric hip adduction x 15 reps holding 5 seconds each      Knee/Hip Exercises: Aerobic   Nustep  L5 x 10 minutes      Moist Heat Therapy   Number Minutes Moist Heat  15 Minutes    Moist Heat Location  Lumbar Spine      Electrical Stimulation   Electrical Stimulation Location  lumbar paraspinals    Electrical Stimulation Action  Pre modulated    Electrical Stimulation Parameters  80-150 Hz x 15 minutes intensity to tolerance    Electrical Stimulation Goals  Tone;Pain             PT Education - 05/05/20 1125    Education Details  Importance of stretching and maintaining flexibility    Person(s) Educated  Patient    Methods  Explanation;Demonstration    Comprehension  Verbalized understanding;Returned demonstration          PT Long Term Goals - 05/05/20 1130      PT LONG TERM GOAL #1   Title  Pt will be independent in his HEP and progression.    Time  6    Period  Weeks    Status  On-going      PT LONG TERM GOAL #2   Title  Pt will improve his bilateral  LE strength to 5/5 in order to improve function.    Time  6    Period  Weeks    Status  On-going      PT LONG TERM GOAL #3   Title  Pt will be able to bend over and pick up 10 # object from floor to table height.    Baseline  Met 05/02/20    Time  6    Period  Weeks    Status  Achieved      PT LONG TERM GOAL #4   Title  pt will be able to perform 1/2 kneeling to standing using single UE support on surface as needed.    Baseline  met 05/02/20    Time  6    Period  Weeks    Status  Achieved      PT LONG TERM GOAL #5   Title  Pt will be able to walk 20 minutes with no pain and decreased fear of falling.    Time  6    Period  Weeks    Status  Partially Met      PT LONG TERM GOAL #6   Title  Pt will improve his BERG balance score from 48/56 to >/= 52/56.    Baseline  met 05/02/20    Period  Weeks    Status  Achieved            Plan - 05/05/20 1126    Clinical Impression Statement  Pt tolerating treatment well today with concentration on Lumbar and LE stretching. Pt reporting 1/10 pain at end of session which was a decline from entering clinic. Continue to progress with core strengtthening, LE strenghteing, and improved functional mobility including bending and lifting mechanics. Continue skilled PT to progress toward goals not met.    Personal Factors and Comorbidities  Comorbidity 1;Comorbidity 2;Comorbidity 3+    Comorbidities  Hernia repair, OA, DDD, CABG, hypothyroidism, H/o TIA's, HTN, R knee surgery    Examination-Activity Limitations  Lift;Squat;Stand;Sit    Stability/Clinical Decision Making  Stable/Uncomplicated  Rehab Potential  Good    PT Frequency  2x / week    PT Duration  6 weeks    PT Treatment/Interventions  ADLs/Self Care Home Management;Cryotherapy;Electrical Stimulation;Ultrasound;Moist Heat;Therapeutic activities;Therapeutic exercise;Patient/family education;Passive range of motion;Traction;Gait training;Stair training;Functional mobility training;Balance  training;Neuromuscular re-education;Manual techniques;Dry needling    PT Next Visit Plan  cont with POC for Lumbar stretching, core strengtheing, LE strengtheing,  STM and modalities for pain.    PT Home Exercise Plan  Access Code: [email or web address]    [email or web address] and Agree with Plan of Care  Patient       Patient will benefit from skilled therapeutic intervention in order to improve the following deficits and impairments:  Pain, Decreased activity tolerance, Decreased range of motion, Postural dysfunction, Decreased strength, Difficulty walking  Visit Diagnosis: Chronic bilateral low back pain with bilateral sciatica  Difficulty in walking, not elsewhere classified  Muscle weakness (generalized)  Cervicalgia  Abnormal posture     Problem List Patient Active Problem List   Diagnosis Date Noted  . Acute respiratory failure with hypoxia ([place]) 09/16/2019  . Osteoarthritis of spine with radiculopathy, lumbar region 01/08/2018  . Thyroid disease   . Aortic atherosclerosis ([place]) 05/02/2017  . Thrombocytopenia ([place]) 07/10/2016  . Hereditary and idiopathic peripheral neuropathy 10/16/2015  . Mononeuropathy 10/04/2015  . Allergic rhinitis 05/06/2014  . Depression 08/03/2013  . Sick sinus syndrome ([place]) 07/31/2012  . Hypertensive heart disease without CHF   . Chronic atrial fibrillation ([place])   . Coronary artery disease involving native coronary artery of native heart with angina pectoris ([place])   . History of TIAs   . Lumbar disc disease   . GERD (gastroesophageal reflux disease)   . Chronic anticoagulation   . Benign prostatic hyperplasia   . Hyperlipidemia     Oretha Caprice, PT, MPT 05/05/2020, 11:33 AM  [facility] [address] [place], [place], [address] Phone: [phone]   Fax:  [phone]  Name: Delvecchio Madole MRN: [id number] Date of Birth: Apr 24, 1929

## 2020-05-09 ENCOUNTER — Other Ambulatory Visit: Payer: Self-pay

## 2020-05-09 ENCOUNTER — Ambulatory Visit: Payer: Medicare Other | Admitting: Physical Therapy

## 2020-05-09 ENCOUNTER — Encounter: Payer: Self-pay | Admitting: Physical Therapy

## 2020-05-09 DIAGNOSIS — M5442 Lumbago with sciatica, left side: Secondary | ICD-10-CM | POA: Diagnosis not present

## 2020-05-09 DIAGNOSIS — G8929 Other chronic pain: Secondary | ICD-10-CM

## 2020-05-09 DIAGNOSIS — R262 Difficulty in walking, not elsewhere classified: Secondary | ICD-10-CM

## 2020-05-09 NOTE — Therapy (Signed)
[facility] [place], [place], [address] Phone: [phone]   Fax:  [phone]  Physical Therapy Treatment  Patient Details  Name: Dalton Vaughan MRN: [id number] Date of Birth: 12/21/1928 Referring Provider (PT): Erline Levine MD   Encounter Date: 05/09/2020  PT End of Session - 05/09/20 1208    Visit Number  20    Number of Visits  24    Date for PT Re-Evaluation  05/27/20    Authorization Type  Foto 16th visit 44 % limitation , PN every 10th visit KX modifier after 15 visit    PT Start Time  1118    PT Stop Time  1204    PT Time Calculation (min)  46 min    Activity Tolerance  Patient tolerated treatment well    Behavior During Therapy  [facility] for tasks assessed/performed       Past Medical History:  Diagnosis Date  . Anal fissure   . Atrial fibrillation ([place])   . BPH (benign prostatic hypertrophy)   . CAD (coronary artery disease)    Dr. Wynonia Lawman  . Cataract   . COVID-19   . Depression 2004  . Diverticulosis   . GERD (gastroesophageal reflux disease)   . History of TIAs   . Hyperlipidemia   . Hypertension   . Hypertensive heart disease without CHF   . Hypothyroidism   . Internal hemorrhoids   . Lumbar disc disease   . Oral bleeding 08/20/2012   Following molar tooth extraction July, 2013  . Ruptured disk 1995  . Shingles   . Thyroid disease   . Vitamin D deficiency   . Vitreous detachment     Past Surgical History:  Procedure Laterality Date  . bilateral cataract surgery  6/07  . CARDIAC CATHETERIZATION    . CORONARY ARTERY BYPASS GRAFT  12/99    Dr. Servando Snare   . cysloscopy  8/08  . EYE SURGERY    . HERNIA REPAIR  1997   right  . herninated disc  1995  . PROSTATE BIOPSY  8/08  . TONSILLECTOMY AND ADENOIDECTOMY      There were no vitals filed for this visit.  Subjective Assessment - 05/09/20 1208    Subjective  COVID-19 screen performed prior to patient entering clinic.  Doing better.     Pertinent History  Hernia repair, OA, DDD, CABG, hypothyroidism, H/o TIA's, HTN, R knee srugery    Patient Stated Goals  I would like to be able to work outside with pain, I have trouble walking and I want walk better.    Currently in Pain?  Yes    Pain Score  2     Pain Location  Back    Pain Orientation  Lower    Pain Descriptors / Indicators  Aching;Discomfort    Pain Type  Chronic pain    Pain Onset  More than a month ago                        [facility] Adult PT Treatment/Exercise - 05/09/20 0001      Exercises   Exercises  Knee/Hip      Lumbar Exercises: Machines for Strengthening   Cybex Lumbar Extension  60# x 4 minutes.    Other Lumbar Machine Exercise  60# ab machine x 4 minutes.      Knee/Hip Exercises: Aerobic   Nustep  Level 4 x 15 minutes.      Modalities  Modalities  Electrical Stimulation;Moist Heat      Moist Heat Therapy   Number Minutes Moist Heat  20 Minutes    Moist Heat Location  Lumbar Spine      Electrical Stimulation   Electrical Stimulation Location  LB    Electrical Stimulation Action  IFC    Electrical Stimulation Parameters  80-150 Hz x 20 minutes.    Electrical Stimulation Goals  Tone;Pain                  PT Long Term Goals - 05/05/20 1130      PT LONG TERM GOAL #1   Title  Pt will be independent in his HEP and progression.    Time  6    Period  Weeks    Status  On-going      PT LONG TERM GOAL #2   Title  Pt will improve his bilateral LE strength to 5/5 in order to improve function.    Time  6    Period  Weeks    Status  On-going      PT LONG TERM GOAL #3   Title  Pt will be able to bend over and pick up 10 # object from floor to table height.    Baseline  Met 05/02/20    Time  6    Period  Weeks    Status  Achieved      PT LONG TERM GOAL #4   Title  pt will be able to perform 1/2 kneeling to standing using single UE support on surface as needed.    Baseline  met 05/02/20    Time  6    Period  Weeks     Status  Achieved      PT LONG TERM GOAL #5   Title  Pt will be able to walk 20 minutes with no pain and decreased fear of falling.    Time  6    Period  Weeks    Status  Partially Met      PT LONG TERM GOAL #6   Title  Pt will improve his BERG balance score from 48/56 to >/= 52/56.    Baseline  met 05/02/20    Period  Weeks    Status  Achieved            Plan - 05/09/20 1212    Clinical Impression Statement  Patient did great today witha 10# increase in back extension and ab machine today.  Back pain quite low today.  Good progress.    Personal Factors and Comorbidities  Comorbidity 1;Comorbidity 2;Comorbidity 3+    Comorbidities  Hernia repair, OA, DDD, CABG, hypothyroidism, H/o TIA's, HTN, R knee surgery    Examination-Activity Limitations  Lift;Squat;Stand;Sit    Stability/Clinical Decision Making  Stable/Uncomplicated    Rehab Potential  Good    PT Frequency  2x / week    PT Duration  6 weeks    PT Treatment/Interventions  ADLs/Self Care Home Management;Cryotherapy;Electrical Stimulation;Ultrasound;Moist Heat;Therapeutic activities;Therapeutic exercise;Patient/family education;Passive range of motion;Traction;Gait training;Stair training;Functional mobility training;Balance training;Neuromuscular re-education;Manual techniques;Dry needling    PT Next Visit Plan  cont with POC for Lumbar stretching, core strengtheing, LE strengtheing,  STM and modalities for pain.    PT Home Exercise Plan  Access Code: [email or web address]    [email or web address] and Agree with Plan of Care  Patient       Patient will benefit from skilled therapeutic intervention in order to improve the following deficits and  impairments:  Pain, Decreased activity tolerance, Decreased range of motion, Postural dysfunction, Decreased strength, Difficulty walking  Visit Diagnosis: Chronic bilateral low back pain with bilateral sciatica  Difficulty in walking, not elsewhere classified     Problem List Patient Active  Problem List   Diagnosis Date Noted  . Acute respiratory failure with hypoxia ([place]) 09/16/2019  . Osteoarthritis of spine with radiculopathy, lumbar region 01/08/2018  . Thyroid disease   . Aortic atherosclerosis ([place]) 05/02/2017  . Thrombocytopenia ([place]) 07/10/2016  . Hereditary and idiopathic peripheral neuropathy 10/16/2015  . Mononeuropathy 10/04/2015  . Allergic rhinitis 05/06/2014  . Depression 08/03/2013  . Sick sinus syndrome ([place]) 07/31/2012  . Hypertensive heart disease without CHF   . Chronic atrial fibrillation ([place])   . Coronary artery disease involving native coronary artery of native heart with angina pectoris ([place])   . History of TIAs   . Lumbar disc disease   . GERD (gastroesophageal reflux disease)   . Chronic anticoagulation   . Benign prostatic hyperplasia   . Hyperlipidemia     Alianna Wurster, [place] MPT 05/09/2020, 12:15 PM  [facility] [address] [employer] [place], [place], [address] Phone: [phone]   Fax:  [phone]  Name: Adolpho Meenach MRN: [id number] Date of Birth: August 03, 1929

## 2020-05-12 ENCOUNTER — Other Ambulatory Visit: Payer: Self-pay

## 2020-05-12 ENCOUNTER — Ambulatory Visit: Payer: Medicare Other | Admitting: Physical Therapy

## 2020-05-12 DIAGNOSIS — M6281 Muscle weakness (generalized): Secondary | ICD-10-CM

## 2020-05-12 DIAGNOSIS — M5441 Lumbago with sciatica, right side: Secondary | ICD-10-CM

## 2020-05-12 DIAGNOSIS — M5442 Lumbago with sciatica, left side: Secondary | ICD-10-CM | POA: Diagnosis not present

## 2020-05-12 DIAGNOSIS — G8929 Other chronic pain: Secondary | ICD-10-CM

## 2020-05-12 DIAGNOSIS — R262 Difficulty in walking, not elsewhere classified: Secondary | ICD-10-CM

## 2020-05-12 NOTE — Therapy (Signed)
[facility] [place], [place], [address] Phone: [phone]   Fax:  [phone]  Physical Therapy Treatment  Patient Details  Name: Dalton Vaughan MRN: [id number] Date of Birth: Apr 30, 1929 Referring Provider (PT): Erline Levine MD   Encounter Date: 05/12/2020  PT End of Session - 05/12/20 1007    Visit Number  21    Number of Visits  24    Date for PT Re-Evaluation  05/27/20    Authorization Type  [employer]oto 16th visit 44 % limitation , PN every 10th visit KX modifier after 15 visit    PT Start Time  0945    PT Stop Time  1036    PT Time Calculation (min)  51 min    Activity Tolerance  Patient tolerated treatment well    Behavior During Therapy  [facility] for tasks assessed/performed       Past Medical History:  Diagnosis Date  . Anal fissure   . Atrial fibrillation ([place])   . BPH (benign prostatic hypertrophy)   . CAD (coronary artery disease)    Dr. Wynonia Lawman  . Cataract   . COVID-19   . Depression 2004  . Diverticulosis   . GERD (gastroesophageal reflux disease)   . History of TIAs   . Hyperlipidemia   . Hypertension   . Hypertensive heart disease without CHF   . Hypothyroidism   . Internal hemorrhoids   . Lumbar disc disease   . Oral bleeding 08/20/2012   Following molar tooth extraction July, 2013  . Ruptured disk 1995  . Shingles   . Thyroid disease   . Vitamin D deficiency   . Vitreous detachment     Past Surgical History:  Procedure Laterality Date  . bilateral cataract surgery  6/07  . CARDIAC CATHETERIZATION    . CORONARY ARTERY BYPASS GRAFT  12/99    Dr. Servando Snare   . cysloscopy  8/08  . EYE SURGERY    . HERNIA REPAIR  1997   right  . herninated disc  1995  . PROSTATE BIOPSY  8/08  . TONSILLECTOMY AND ADENOIDECTOMY      There were no vitals filed for this visit.  Subjective Assessment - 05/12/20 0953    Subjective  COVID-19 screen performed prior to patient entering clinic.  Patient arrived with no  new complaints    Pertinent History  Hernia repair, OA, DDD, CABG, hypothyroidism, H/o TIA's, HTN, R knee srugery    Patient Stated Goals  I would like to be able to work outside with pain, I have trouble walking and I want walk better.    Currently in Pain?  Yes    Pain Score  2     Pain Location  Back    Pain Orientation  Lower    Pain Descriptors / Indicators  Discomfort    Pain Type  Chronic pain    Pain Onset  More than a month ago    Pain Frequency  Intermittent    Aggravating Factors   prolong movement or activity    Pain Relieving Factors  at rest                        [facility] Adult PT Treatment/Exercise - 05/12/20 0001      Lumbar Exercises: Machines for Strengthening   Cybex Lumbar Extension  60# x 4 minutes.    Other Lumbar Machine Exercise  60# ab machine x 4 minutes.  Lumbar Exercises: Standing   Other Standing Lumbar Exercises  latt pull w blue XTS x20 core focus    Other Standing Lumbar Exercises  diagnols w blue XTS 2x10 each way      Lumbar Exercises: Supine   AB Set Limitations  --    Bridge with clamshell  20 reps   red t-band   Straight Leg Raise  3 seconds   2x10     Knee/Hip Exercises: Aerobic   Nustep  Level 5 x 15 minutes.      Moist Heat Therapy   Number Minutes Moist Heat  15 Minutes    Moist Heat Location  Lumbar Spine      Electrical Stimulation   Electrical Stimulation Location  LB    Electrical Stimulation Action  IFC    Electrical Stimulation Parameters  80-150hz x28mn    Electrical Stimulation Goals  Tone;Pain                  PT Long Term Goals - 05/12/20 1008      PT LONG TERM GOAL #1   Title  Pt will be independent in his HEP and progression.    Time  6    Period  Weeks    Status  On-going      PT LONG TERM GOAL #2   Title  Pt will improve his bilateral LE strength to 5/5 in order to improve function.    Time  6    Period  Weeks    Status  On-going      PT LONG TERM GOAL #3   Title  Pt  will be able to bend over and pick up 10 # object from floor to table height.    Baseline  Met 05/02/20    Time  6    Period  Weeks    Status  Achieved      PT LONG TERM GOAL #4   Title  pt will be able to perform 1/2 kneeling to standing using single UE support on surface as needed.    Baseline  met 05/02/20    Time  6    Period  Weeks    Status  Achieved      PT LONG TERM GOAL #5   Title  Pt will be able to walk 20 minutes with no pain and decreased fear of falling.    Time  6    Period  Weeks    Status  Partially Met   able to walk 20 min yet 2/10 pain 05/12/20     PT LONG TERM GOAL #6   Title  Pt will improve his BERG balance score from 48/56 to >/= 52/56.    Baseline  met 05/02/20    Time  6    Period  Weeks    Status  Achieved            Plan - 05/12/20 1023    Clinical Impression Statement  Patient tolerated treatment well today. Patient continues to report improvement and no pain more than 2/10 pain and able to perform ADL's with greater ease. Patient would like to finish visits and DC to HEP after. Patient remaining goals progressing.    Personal Factors and Comorbidities  Comorbidity 1;Comorbidity 2;Comorbidity 3+    Comorbidities  Hernia repair, OA, DDD, CABG, hypothyroidism, H/o TIA's, HTN, R knee surgery    Examination-Activity Limitations  Lift;Squat;Stand;Sit    Examination-Participation Restrictions  Yard Work    Stability/Clinical Decision  Making  Stable/Uncomplicated    Rehab Potential  Good    PT Frequency  2x / week    PT Duration  6 weeks    PT Treatment/Interventions  ADLs/Self Care Home Management;Cryotherapy;Electrical Stimulation;Ultrasound;Moist Heat;Therapeutic activities;Therapeutic exercise;Patient/family education;Passive range of motion;Traction;Gait training;Stair training;Functional mobility training;Balance training;Neuromuscular re-education;Manual techniques;Dry needling    PT Next Visit Plan  cont with POC for Lumbar stretching, core  strengtheing, LE strengtheing,  STM and modalities for pain. DC after remaining visits    Consulted and Agree with Plan of Care  Patient       Patient will benefit from skilled therapeutic intervention in order to improve the following deficits and impairments:  Pain, Decreased activity tolerance, Decreased range of motion, Postural dysfunction, Decreased strength, Difficulty walking  Visit Diagnosis: Chronic bilateral low back pain with bilateral sciatica  Difficulty in walking, not elsewhere classified  Muscle weakness (generalized)     Problem List Patient Active Problem List   Diagnosis Date Noted  . Acute respiratory failure with hypoxia ([place]) 09/16/2019  . Osteoarthritis of spine with radiculopathy, lumbar region 01/08/2018  . Thyroid disease   . Aortic atherosclerosis ([place]) 05/02/2017  . Thrombocytopenia ([place]) 07/10/2016  . Hereditary and idiopathic peripheral neuropathy 10/16/2015  . Mononeuropathy 10/04/2015  . Allergic rhinitis 05/06/2014  . Depression 08/03/2013  . Sick sinus syndrome ([place]) 07/31/2012  . Hypertensive heart disease without CHF   . Chronic atrial fibrillation ([place])   . Coronary artery disease involving native coronary artery of native heart with angina pectoris ([place])   . History of TIAs   . Lumbar disc disease   . GERD (gastroesophageal reflux disease)   . Chronic anticoagulation   . Benign prostatic hyperplasia   . Hyperlipidemia     Shrika Milos P, PTA 05/12/2020, 10:46 AM  [facility] [place], [place], [address] Phone: [phone]   Fax:  [phone]  Name: Dalton Vaughan MRN: [id number] Date of Birth: Feb 02, 1929

## 2020-05-17 ENCOUNTER — Ambulatory Visit: Payer: Medicare Other | Attending: Neurosurgery | Admitting: Physical Therapy

## 2020-05-17 ENCOUNTER — Other Ambulatory Visit: Payer: Self-pay

## 2020-05-17 ENCOUNTER — Encounter: Payer: Self-pay | Admitting: Physical Therapy

## 2020-05-17 DIAGNOSIS — G8929 Other chronic pain: Secondary | ICD-10-CM | POA: Diagnosis present

## 2020-05-17 DIAGNOSIS — M5442 Lumbago with sciatica, left side: Secondary | ICD-10-CM | POA: Insufficient documentation

## 2020-05-17 DIAGNOSIS — R262 Difficulty in walking, not elsewhere classified: Secondary | ICD-10-CM | POA: Insufficient documentation

## 2020-05-17 DIAGNOSIS — M5441 Lumbago with sciatica, right side: Secondary | ICD-10-CM | POA: Diagnosis present

## 2020-05-17 DIAGNOSIS — M6281 Muscle weakness (generalized): Secondary | ICD-10-CM | POA: Insufficient documentation

## 2020-05-17 DIAGNOSIS — R293 Abnormal posture: Secondary | ICD-10-CM

## 2020-05-17 DIAGNOSIS — M542 Cervicalgia: Secondary | ICD-10-CM | POA: Insufficient documentation

## 2020-05-17 NOTE — Therapy (Signed)
[facility] [place], [place], [address] Phone: [phone]   Fax:  [phone]  Physical Therapy Treatment  Patient Details  Name: Dalton Vaughan MRN: [id number] Date of Birth: 20-Jan-1929 Referring Provider (PT): Erline Levine MD   Encounter Date: 05/17/2020  PT End of Session - 05/17/20 1102    Visit Number  22    Number of Visits  24    Date for PT Re-Evaluation  05/27/20    Authorization Type  Foto 16th visit 44 % limitation , PN every 10th visit KX modifier after 15 visit    PT Start Time  1030    PT Stop Time  1115    PT Time Calculation (min)  45 min    Activity Tolerance  Patient tolerated treatment well    Behavior During Therapy  [facility] for tasks assessed/performed       Past Medical History:  Diagnosis Date  . Anal fissure   . Atrial fibrillation ([place])   . BPH (benign prostatic hypertrophy)   . CAD (coronary artery disease)    Dr. Wynonia Lawman  . Cataract   . COVID-19   . Depression 2004  . Diverticulosis   . GERD (gastroesophageal reflux disease)   . History of TIAs   . Hyperlipidemia   . Hypertension   . Hypertensive heart disease without CHF   . Hypothyroidism   . Internal hemorrhoids   . Lumbar disc disease   . Oral bleeding 08/20/2012   Following molar tooth extraction July, 2013  . Ruptured disk 1995  . Shingles   . Thyroid disease   . Vitamin D deficiency   . Vitreous detachment     Past Surgical History:  Procedure Laterality Date  . bilateral cataract surgery  6/07  . CARDIAC CATHETERIZATION    . CORONARY ARTERY BYPASS GRAFT  12/99    Dr. Servando Snare   . cysloscopy  8/08  . EYE SURGERY    . HERNIA REPAIR  1997   right  . herninated disc  1995  . PROSTATE BIOPSY  8/08  . TONSILLECTOMY AND ADENOIDECTOMY      There were no vitals filed for this visit.  Subjective Assessment - 05/17/20 1036    Subjective  COVID-19 screen performed prior to patient entering clinic.  Patient arrived with no  new complaints. No reports of pain.    Pertinent History  Hernia repair, OA, DDD, CABG, hypothyroidism, H/o TIA's, HTN, R knee srugery    Patient Stated Goals  I would like to be able to work outside with pain, I have trouble walking and I want walk better.    Currently in Pain?  No/denies                        [facility] Adult PT Treatment/Exercise - 05/17/20 0001      Lumbar Exercises: Stretches   Single Knee to Chest Stretch  Right;Left;3 reps;20 seconds      Lumbar Exercises: Standing   Other Standing Lumbar Exercises  latt pull w blue XTS x20 core focus, diagnols with 6# medicine ball in both dirctions x 10 reps each.     Other Standing Lumbar Exercises  diagnols w blue XTS 2x10 each way      Lumbar Exercises: Supine   Ab Set  10 reps;5 seconds    Bridge with clamshell  20 reps   red t-band   Straight Leg Raise  15 reps;2 seconds  Knee/Hip Exercises: Aerobic   Nustep  Level 5 x 12 minutes.             PT Education - 05/17/20 1102    Education Details  importance of stretching and core strengthening    Person(s) Educated  Patient    Methods  Explanation    Comprehension  Verbalized understanding          PT Long Term Goals - 05/17/20 1105      PT LONG TERM GOAL #1   Title  Pt will be independent in his HEP and progression.    Status  On-going      PT LONG TERM GOAL #2   Title  Pt will improve his bilateral LE strength to 5/5 in order to improve function.    Status  On-going      PT LONG TERM GOAL #3   Title  Pt will be able to bend over and pick up 10 # object from floor to table height.    Status  Achieved      PT LONG TERM GOAL #4   Title  pt will be able to perform 1/2 kneeling to standing using single UE support on surface as needed.    Baseline  met 05/02/20    Period  Weeks    Status  Achieved      PT LONG TERM GOAL #5   Title  Pt will be able to walk 20 minutes with no pain and decreased fear of falling.    Status  Partially  Met      PT LONG TERM GOAL #6   Baseline  met 05/02/20    Period  Weeks    Status  Achieved            Plan - 05/17/20 1103    Clinical Impression Statement  Pt tolerating entire session of stretching and core strengthening today. Pt did mention he has trouble getting up at home after sitting prolonged. Pt reports he has to use his UE to help to rise.  Discussed discharge with updated HEP following 2 more visits. Pt still progressing toward goals set.    Personal Factors and Comorbidities  Comorbidity 1;Comorbidity 2;Comorbidity 3+    Comorbidities  Hernia repair, OA, DDD, CABG, hypothyroidism, H/o TIA's, HTN, R knee surgery    Examination-Activity Limitations  Lift;Squat;Stand;Sit    Stability/Clinical Decision Making  Stable/Uncomplicated    Rehab Potential  Good    PT Frequency  2x / week    PT Duration  6 weeks    PT Treatment/Interventions  ADLs/Self Care Home Management;Cryotherapy;Electrical Stimulation;Ultrasound;Moist Heat;Therapeutic activities;Therapeutic exercise;Patient/family education;Passive range of motion;Traction;Gait training;Stair training;Functional mobility training;Balance training;Neuromuscular re-education;Manual techniques;Dry needling    PT Next Visit Plan  cont with POC for Lumbar stretching, core strengtheing, LE strengtheing,  STM and modalities for pain. DC after remaining visits    PT Home Exercise Plan  Access Code: [email or web address]    [email or web address] and Agree with Plan of Care  Patient       Patient will benefit from skilled therapeutic intervention in order to improve the following deficits and impairments:  Pain, Decreased activity tolerance, Decreased range of motion, Postural dysfunction, Decreased strength, Difficulty walking  Visit Diagnosis: Chronic bilateral low back pain with bilateral sciatica  Difficulty in walking, not elsewhere classified  Muscle weakness (generalized)  Cervicalgia  Abnormal posture     Problem List Patient Active  Problem List   Diagnosis Date Noted  . Acute respiratory failure with hypoxia (  [place]) 09/16/2019  . Osteoarthritis of spine with radiculopathy, lumbar region 01/08/2018  . Thyroid disease   . Aortic atherosclerosis ([place]) 05/02/2017  . Thrombocytopenia ([place]) 07/10/2016  . Hereditary and idiopathic peripheral neuropathy 10/16/2015  . Mononeuropathy 10/04/2015  . Allergic rhinitis 05/06/2014  . Depression 08/03/2013  . Sick sinus syndrome ([place]) 07/31/2012  . Hypertensive heart disease without CHF   . Chronic atrial fibrillation ([place])   . Coronary artery disease involving native coronary artery of native heart with angina pectoris ([place])   . History of TIAs   . Lumbar disc disease   . GERD (gastroesophageal reflux disease)   . Chronic anticoagulation   . Benign prostatic hyperplasia   . Hyperlipidemia     Oretha Caprice, PT, MPT 05/17/2020, 11:14 AM  [facility] [place], [place], [address] Phone: [phone]   Fax:  [phone]  Name: Dalton Vaughan MRN: [id number] Date of Birth: 10-15-1929

## 2020-05-19 ENCOUNTER — Other Ambulatory Visit: Payer: Self-pay

## 2020-05-19 ENCOUNTER — Encounter: Payer: Self-pay | Admitting: Physical Therapy

## 2020-05-19 ENCOUNTER — Ambulatory Visit: Payer: Medicare Other | Admitting: Physical Therapy

## 2020-05-19 DIAGNOSIS — M6281 Muscle weakness (generalized): Secondary | ICD-10-CM

## 2020-05-19 DIAGNOSIS — M5442 Lumbago with sciatica, left side: Secondary | ICD-10-CM

## 2020-05-19 DIAGNOSIS — R262 Difficulty in walking, not elsewhere classified: Secondary | ICD-10-CM

## 2020-05-19 DIAGNOSIS — G8929 Other chronic pain: Secondary | ICD-10-CM

## 2020-05-19 NOTE — Therapy (Signed)
[facility] [place], [place], [address] Phone: [phone]   Fax:  [phone]  Physical Therapy Treatment  Patient Details  Name: Dalton Vaughan MRN: [id number] Date of Birth: 05-26-1929 Referring Provider (PT): Erline Levine MD   Encounter Date: 05/19/2020  PT End of Session - 05/19/20 1032    Visit Number  23    Number of Visits  24    Date for PT Re-Evaluation  05/27/20    Authorization Type  Foto 16th visit 44 % limitation , PN every 10th visit KX modifier after 15 visit    PT Start Time  1030    PT Stop Time  1112    PT Time Calculation (min)  42 min    Activity Tolerance  Patient tolerated treatment well    Behavior During Therapy  [facility] for tasks assessed/performed       Past Medical History:  Diagnosis Date  . Anal fissure   . Atrial fibrillation ([place])   . BPH (benign prostatic hypertrophy)   . CAD (coronary artery disease)    Dr. Wynonia Lawman  . Cataract   . COVID-19   . Depression 2004  . Diverticulosis   . GERD (gastroesophageal reflux disease)   . History of TIAs   . Hyperlipidemia   . Hypertension   . Hypertensive heart disease without CHF   . Hypothyroidism   . Internal hemorrhoids   . Lumbar disc disease   . Oral bleeding 08/20/2012   Following molar tooth extraction July, 2013  . Ruptured disk 1995  . Shingles   . Thyroid disease   . Vitamin D deficiency   . Vitreous detachment     Past Surgical History:  Procedure Laterality Date  . bilateral cataract surgery  6/07  . CARDIAC CATHETERIZATION    . CORONARY ARTERY BYPASS GRAFT  12/99    Dr. Servando Snare   . cysloscopy  8/08  . EYE SURGERY    . HERNIA REPAIR  1997   right  . herninated disc  1995  . PROSTATE BIOPSY  8/08  . TONSILLECTOMY AND ADENOIDECTOMY      There were no vitals filed for this visit.  Subjective Assessment - 05/19/20 1031    Subjective  COVID-19 screen performed prior to patient entering clinic.  Patient arrived feeling  alright but states his knee is not better.    Pertinent History  Hernia repair, OA, DDD, CABG, hypothyroidism, H/o TIA's, HTN, R knee srugery    Patient Stated Goals  I would like to be able to work outside with pain, I have trouble walking and I want walk better.    Currently in Pain?  No/denies         [facility] PT Assessment - 05/19/20 0001      Assessment   Medical Diagnosis  low back pain with bialteral sciatica    Referring Provider (PT)  Erline Levine MD    Hand Dominance  Right    Prior Therapy  for neck      Precautions   Precautions  None      Restrictions   Weight Bearing Restrictions  No                    [facility] Adult PT Treatment/Exercise - 05/19/20 0001      Lumbar Exercises: Aerobic   Nustep  L4 x15 min      Lumbar Exercises: Machines for Strengthening   Cybex Lumbar Extension  60# x30 reps  Other Lumbar Machine Exercise  Lumbar crunch 60# 3x10 reps      Lumbar Exercises: Standing   Shoulder Extension  Strengthening;Both;20 reps;Limitations    Shoulder Extension Limitations  Blue XTS      Lumbar Exercises: Supine   Ab Set  10 reps;5 seconds    Bridge with clamshell  20 reps;5 seconds    Bridge with Ball Squeeze Limitations  red theraband    Straight Leg Raise  20 reps;2 seconds                  PT Long Term Goals - 05/17/20 1105      PT LONG TERM GOAL #1   Title  Pt will be independent in his HEP and progression.    Status  On-going      PT LONG TERM GOAL #2   Title  Pt will improve his bilateral LE strength to 5/5 in order to improve function.    Status  On-going      PT LONG TERM GOAL #3   Title  Pt will be able to bend over and pick up 10 # object from floor to table height.    Status  Achieved      PT LONG TERM GOAL #4   Title  pt will be able to perform 1/2 kneeling to standing using single UE support on surface as needed.    Baseline  met 05/02/20    Period  Weeks    Status  Achieved      PT LONG TERM GOAL #5    Title  Pt will be able to walk 20 minutes with no pain and decreased fear of falling.    Status  Partially Met      PT LONG TERM GOAL #6   Baseline  met 05/02/20    Period  Weeks    Status  Achieved            Plan - 05/19/20 1200    Clinical Impression Statement  Patient presented in clinic with reports of improvement in regards to LBP with ADLs. Patient guided through resisted lumbar and core strengthening with minimal VCs for technique correction. Patient still very active with ADLs. Denied any modalities during today's treatment.    Personal Factors and Comorbidities  Comorbidity 1;Comorbidity 2;Comorbidity 3+    Comorbidities  Hernia repair, OA, DDD, CABG, hypothyroidism, H/o TIA's, HTN, R knee surgery    Examination-Activity Limitations  Lift;Squat;Stand;Sit    Examination-Participation Restrictions  Yard Work    Stability/Clinical Decision Making  Stable/Uncomplicated    Rehab Potential  Good    PT Frequency  2x / week    PT Duration  6 weeks    PT Treatment/Interventions  ADLs/Self Care Home Management;Cryotherapy;Electrical Stimulation;Ultrasound;Moist Heat;Therapeutic activities;Therapeutic exercise;Patient/family education;Passive range of motion;Traction;Gait training;Stair training;Functional mobility training;Balance training;Neuromuscular re-education;Manual techniques;Dry needling    PT Next Visit Plan  cont with POC for Lumbar stretching, core strengtheing, LE strengtheing,  STM and modalities for pain. DC after remaining visits    PT Home Exercise Plan  Access Code: [email or web address]    [email or web address] and Agree with Plan of Care  Patient       Patient will benefit from skilled therapeutic intervention in order to improve the following deficits and impairments:  Pain, Decreased activity tolerance, Decreased range of motion, Postural dysfunction, Decreased strength, Difficulty walking  Visit Diagnosis: Chronic bilateral low back pain with bilateral sciatica  Difficulty in  walking, not elsewhere classified  Muscle weakness (generalized)  Problem List Patient Active Problem List   Diagnosis Date Noted  . Acute respiratory failure with hypoxia ([place]) 09/16/2019  . Osteoarthritis of spine with radiculopathy, lumbar region 01/08/2018  . Thyroid disease   . Aortic atherosclerosis ([place]) 05/02/2017  . Thrombocytopenia ([place]) 07/10/2016  . Hereditary and idiopathic peripheral neuropathy 10/16/2015  . Mononeuropathy 10/04/2015  . Allergic rhinitis 05/06/2014  . Depression 08/03/2013  . Sick sinus syndrome ([place]) 07/31/2012  . Hypertensive heart disease without CHF   . Chronic atrial fibrillation ([place])   . Coronary artery disease involving native coronary artery of native heart with angina pectoris ([place])   . History of TIAs   . Lumbar disc disease   . GERD (gastroesophageal reflux disease)   . Chronic anticoagulation   . Benign prostatic hyperplasia   . Hyperlipidemia     Standley Brooking, PTA 05/19/2020, 12:18 PM  [facility] [address] [place], [place], [address] Phone: [phone]   Fax:  [phone]  Name: Dalton Vaughan MRN: [id number] Date of Birth: October 02, 1929

## 2020-05-25 ENCOUNTER — Ambulatory Visit: Payer: Medicare Other | Admitting: Physical Therapy

## 2020-05-25 ENCOUNTER — Other Ambulatory Visit: Payer: Self-pay

## 2020-05-25 DIAGNOSIS — M5442 Lumbago with sciatica, left side: Secondary | ICD-10-CM

## 2020-05-25 DIAGNOSIS — G8929 Other chronic pain: Secondary | ICD-10-CM

## 2020-05-25 NOTE — Therapy (Signed)
[facility] [place], [place], [address] Phone: [phone]   Fax:  [phone]  Physical Therapy Treatment  Patient Details  Name: Dalton Vaughan MRN: [id number] Date of Birth: 11-18-1929 Referring Provider (PT): Erline Levine MD   Encounter Date: 05/25/2020  PT End of Session - 05/25/20 1101    Visit Number  24    Number of Visits  24    Date for PT Re-Evaluation  05/27/20    Authorization Type  [employer] 16th visit 44 % limitation , PN every 10th visit KX modifier after 15 visit    PT Start Time  1030    PT Stop Time  1120    PT Time Calculation (min)  50 min    Activity Tolerance  Patient tolerated treatment well    Behavior During Therapy  [facility] for tasks assessed/performed       Past Medical History:  Diagnosis Date  . Anal fissure   . Atrial fibrillation ([place])   . BPH (benign prostatic hypertrophy)   . CAD (coronary artery disease)    Dr. Wynonia Lawman  . Cataract   . COVID-19   . Depression 2004  . Diverticulosis   . GERD (gastroesophageal reflux disease)   . History of TIAs   . Hyperlipidemia   . Hypertension   . Hypertensive heart disease without CHF   . Hypothyroidism   . Internal hemorrhoids   . Lumbar disc disease   . Oral bleeding 08/20/2012   Following molar tooth extraction July, 2013  . Ruptured disk 1995  . Shingles   . Thyroid disease   . Vitamin D deficiency   . Vitreous detachment     Past Surgical History:  Procedure Laterality Date  . bilateral cataract surgery  6/07  . CARDIAC CATHETERIZATION    . CORONARY ARTERY BYPASS GRAFT  12/99    Dr. Servando Snare   . cysloscopy  8/08  . EYE SURGERY    . HERNIA REPAIR  1997   right  . herninated disc  1995  . PROSTATE BIOPSY  8/08  . TONSILLECTOMY AND ADENOIDECTOMY      There were no vitals filed for this visit.  Subjective Assessment - 05/25/20 1106    Subjective  COVID-19 screen performed prior to patient entering clinic.  Patient noting a lot of  improvement over the last several weeks.  Very pleased with progress.    Pertinent History  Hernia repair, OA, DDD, CABG, hypothyroidism, H/o TIA's, HTN, R knee srugery    Patient Stated Goals  I would like to be able to work outside with pain, I have trouble walking and I want walk better.    Currently in Pain?  Yes    Pain Score  1     Pain Location  Back    Pain Orientation  Lower    Pain Descriptors / Indicators  Discomfort    Pain Onset  More than a month ago                        [facility] Adult PT Treatment/Exercise - 05/25/20 0001      Exercises   Exercises  Knee/Hip      Lumbar Exercises: Aerobic   Nustep  Level 5 x 15 minutes.      Lumbar Exercises: Machines for Strengthening   Cybex Lumbar Extension  70# x 3 minutes.    Cybex Knee Extension  20# x 2 minutes.    Cybex Knee  Flexion  40# x 2 minutes.    Other Lumbar Machine Exercise  Ab machine with 70# x 3 minutes.      Modalities   Modalities  Electrical Stimulation;Moist Heat      Moist Heat Therapy   Number Minutes Moist Heat  20 Minutes    Moist Heat Location  Lumbar Spine      Electrical Stimulation   Electrical Stimulation Location  LB    Electrical Stimulation Action  IFC    Electrical Stimulation Parameters  80-150 Hz x 20 minutes.    Electrical Stimulation Goals  Pain                  PT Long Term Goals - 05/25/20 1107      PT LONG TERM GOAL #1   Title  Pt will be independent in his HEP and progression.    Time  6    Period  Weeks    Status  Achieved      PT LONG TERM GOAL #2   Title  Pt will improve his bilateral LE strength to 5/5 in order to improve function.    Time  6    Period  Weeks    Status  Achieved      PT LONG TERM GOAL #3   Title  Pt will be able to bend over and pick up 10 # object from floor to table height.    Baseline  Met 05/02/20    Time  6    Period  Weeks    Status  Achieved      PT LONG TERM GOAL #4   Title  pt will be able to perform 1/2  kneeling to standing using single UE support on surface as needed.    Baseline  met 05/02/20    Time  6    Period  Weeks    Status  Achieved      PT LONG TERM GOAL #5   Title  Pt will be able to walk 20 minutes with no pain and decreased fear of falling.    Time  6    Period  Weeks    Status  Achieved      PT LONG TERM GOAL #6   Title  Pt will improve his BERG balance score from 48/56 to >/= 52/56.    Baseline  met 05/02/20    Time  6    Period  Weeks    Status  Achieved            Plan - 05/25/20 1111    Clinical Impression Statement  The patient did an excellent job meeting all LTG's and doing much better than predicted on his FOTO score.    Personal Factors and Comorbidities  Comorbidity 1;Comorbidity 2;Comorbidity 3+    Comorbidities  Hernia repair, OA, DDD, CABG, hypothyroidism, H/o TIA's, HTN, R knee surgery    Examination-Activity Limitations  Lift;Squat;Stand;Sit    Examination-Participation Restrictions  Yard Work    Stability/Clinical Decision Making  Stable/Uncomplicated    Rehab Potential  Good    PT Frequency  2x / week    PT Duration  6 weeks    PT Treatment/Interventions  ADLs/Self Care Home Management;Cryotherapy;Electrical Stimulation;Ultrasound;Moist Heat;Therapeutic activities;Therapeutic exercise;Patient/family education;Passive range of motion;Traction;Gait training;Stair training;Functional mobility training;Balance training;Neuromuscular re-education;Manual techniques;Dry needling    PT Next Visit Plan  cont with POC for Lumbar stretching, core strengtheing, LE strengtheing,  STM and modalities for pain. DC after remaining visits  PT Home Exercise Plan  Access Code: [email or web address]    [email or web address] and Agree with Plan of Care  Patient       Patient will benefit from skilled therapeutic intervention in order to improve the following deficits and impairments:  Pain, Decreased activity tolerance, Decreased range of motion, Postural dysfunction, Decreased strength,  Difficulty walking  Visit Diagnosis: Chronic bilateral low back pain with bilateral sciatica     Problem List Patient Active Problem List   Diagnosis Date Noted  . Acute respiratory failure with hypoxia ([place]) 09/16/2019  . Osteoarthritis of spine with radiculopathy, lumbar region 01/08/2018  . Thyroid disease   . Aortic atherosclerosis ([place]) 05/02/2017  . Thrombocytopenia ([place]) 07/10/2016  . Hereditary and idiopathic peripheral neuropathy 10/16/2015  . Mononeuropathy 10/04/2015  . Allergic rhinitis 05/06/2014  . Depression 08/03/2013  . Sick sinus syndrome ([place]) 07/31/2012  . Hypertensive heart disease without CHF   . Chronic atrial fibrillation ([place])   . Coronary artery disease involving native coronary artery of native heart with angina pectoris ([place])   . History of TIAs   . Lumbar disc disease   . GERD (gastroesophageal reflux disease)   . Chronic anticoagulation   . Benign prostatic hyperplasia   . Hyperlipidemia    .PHYSICAL THERAPY DISCHARGE SUMMARY  Visits from Start of Care: 12.  Current functional level related to goals / functional outcomes: See above.   Remaining deficits: All goals met.   Education / Equipment: HEP. Plan: Patient agrees to discharge.  Patient goals were met. Patient is being discharged due to meeting the stated rehab goals.  ?????     Aubree Doody, [place] MPT 05/25/2020, 11:20 AM  [facility] [address] [place], [place], [address] Phone: [phone]   Fax:  [phone]  Name: Dalton Vaughan MRN: [id number] Date of Birth: 09-27-1929

## 2020-06-02 ENCOUNTER — Encounter: Payer: Self-pay | Admitting: Family Medicine

## 2020-06-02 ENCOUNTER — Ambulatory Visit (INDEPENDENT_AMBULATORY_CARE_PROVIDER_SITE_OTHER): Payer: Medicare Other | Admitting: Family Medicine

## 2020-06-02 ENCOUNTER — Other Ambulatory Visit: Payer: Self-pay

## 2020-06-02 VITALS — BP 138/77 | HR 57 | Temp 98.1°F | Ht 72.0 in | Wt 172.6 lb

## 2020-06-02 DIAGNOSIS — I10 Essential (primary) hypertension: Secondary | ICD-10-CM

## 2020-06-02 DIAGNOSIS — G8929 Other chronic pain: Secondary | ICD-10-CM

## 2020-06-02 DIAGNOSIS — Z7901 Long term (current) use of anticoagulants: Secondary | ICD-10-CM | POA: Diagnosis not present

## 2020-06-02 DIAGNOSIS — G479 Sleep disorder, unspecified: Secondary | ICD-10-CM

## 2020-06-02 DIAGNOSIS — I482 Chronic atrial fibrillation, unspecified: Secondary | ICD-10-CM | POA: Diagnosis not present

## 2020-06-02 DIAGNOSIS — M25561 Pain in right knee: Secondary | ICD-10-CM

## 2020-06-02 LAB — COAGUCHEK XS/INR WAIVED
INR: 1.9 — ABNORMAL HIGH (ref 0.9–1.1)
Prothrombin Time: 22.3 s

## 2020-06-02 LAB — POCT INR: INR: 1.9 — AB (ref 2–3)

## 2020-06-02 NOTE — Patient Instructions (Addendum)
Increased dose to 1 tablet every day except for Mondays and Wednesdays continue only 1/2 tablet daily.  See me back in 1 week for recheck  Call Dr. Anne Fu office and schedule an appointment for your knee. [phone]  You can use 3 mg of melatonin every night at bedtime to try and help you rest.

## 2020-06-02 NOTE — Progress Notes (Signed)
Subjective: CC: f/u afib PCP: [name], DO HPI:Dalton Vaughan is a 86 y.o. male presenting to clinic today for:  1.  Atrial fibrillation/insomnia Allergic reaction to Eliquis in past.  Did home INR but a hard stick so comes to office now.  Goal 2-3.  No missed doses.  No change in diet.  He reports compliance with half a tablet of Coumadin Monday Wednesday Friday and 1 full tablet daily all other days.  No hematochezia, melena, hematuria.  He does report some fatigability but overall remains as active as possible.  He admits to some poor sleep which may be contributing.  2.  Hypertension Patient reports compliance with quadruple therapy.  His Norvasc was increased to 5 mg daily for uncontrolled blood pressures.  Tolerating medication without difficulty.  No chest pain, shortness of breath.  3.  Right-sided knee pain Patient reports ongoing right-sided knee pain that is worse after sitting for prolonged amount of time.  He had a hemarthrosis last year that required drainage.  He is established with Dr. Ricki Rodriguez but has not yet contacted their office for an appointment.  He does report sensation of the knee wanting to give out from underneath him.  ROS: Per HPI  Allergies  Allergen Reactions  . Paxil [Paroxetine Hcl] Palpitations  . Eliquis [Apixaban] Other (See Comments)    dizziness  . Penicillins Other (See Comments)    Did it involve swelling of the face/tongue/throat, SOB, or low BP? Unknown Did it involve sudden or severe rash/hives, skin peeling, or any reaction on the inside of your mouth or nose? Unknown Did you need to seek medical attention at a hospital or doctor's office? Unknown When did it last happen?unk If all above answers are "NO", may proceed with cephalosporin use.   Christy Sartorius Sodium] Other (See Comments)    myalgias  . Zetia [Ezetimibe] Other (See Comments)    myalgias  . Vytorin [Ezetimibe-Simvastatin] Other (See Comments)     myalgias   Past Medical History:  Diagnosis Date  . Anal fissure   . Atrial fibrillation ([place])   . BPH (benign prostatic hypertrophy)   . CAD (coronary artery disease)    Dr. Wynonia Lawman  . Cataract   . COVID-19   . Depression 2004  . Diverticulosis   . GERD (gastroesophageal reflux disease)   . History of TIAs   . Hyperlipidemia   . Hypertension   . Hypertensive heart disease without CHF   . Hypothyroidism   . Internal hemorrhoids   . Lumbar disc disease   . Oral bleeding 08/20/2012   Following molar tooth extraction July, 2013  . Ruptured disk 1995  . Shingles   . Thyroid disease   . Vitamin D deficiency   . Vitreous detachment     Current Outpatient Medications:  .  amLODipine (NORVASC) 10 MG tablet, Take 1 tablet (10 mg total) by mouth daily., Disp: 90 tablet, Rfl: 3 .  Calcium Carb-Cholecalciferol (CALCIUM + D3) 600-200 MG-UNIT TABS, Take 1 tablet by mouth daily. , Disp: , Rfl:  .  Cholecalciferol (VITAMIN D-3) 5000 UNITS TABS, Take 1 tablet by mouth daily. , Disp: , Rfl:  .  doxazosin (CARDURA) 8 MG tablet, Take 1 tablet (8 mg total) by mouth at bedtime., Disp: 90 tablet, Rfl: 3 .  doxycycline (VIBRA-TABS) 100 MG tablet, Take 1 tablet (100 mg total) by mouth 2 (two) times daily. 1 po bid, Disp: 28 tablet, Rfl: 0 .  levothyroxine (SYNTHROID) 50 MCG tablet,  TAKE 1 TABLET DAILY BEFORE BREAKFAST, Disp: 30 tablet, Rfl: 0 .  metoprolol succinate (TOPROL-XL) 25 MG 24 hr tablet, Take 1 tablet (25 mg total) by mouth daily., Disp: 90 tablet, Rfl: 0 .  mirtazapine (REMERON) 15 MG tablet, Take 1 tablet (15 mg total) by mouth at bedtime., Disp: 90 tablet, Rfl: 1 .  Multiple Vitamin (MULTIVITAMIN) tablet, Take 1 tablet by mouth daily.  , Disp: , Rfl:  .  ramipril (ALTACE) 10 MG capsule, Take 1 capsule (10 mg total) by mouth daily., Disp: 90 capsule, Rfl: 1 .  rosuvastatin (CRESTOR) 5 MG tablet, Take 1 tablet (5 mg total) by mouth daily. as directed (Patient taking differently: Take 5 mg by  mouth every Monday, Wednesday, and Friday. ), Disp: 90 tablet, Rfl: 3 .  triamterene-hydrochlorothiazide (MAXZIDE-25) 37.5-25 MG tablet, TAKE 1 TABLET DAILY, Disp: 90 tablet, Rfl: 3 .  vitamin C (ASCORBIC ACID) 500 MG tablet, Take 500 mg by mouth daily. , Disp: , Rfl:  .  warfarin (COUMADIN) 5 MG tablet, TAKE AS DIRECTED, Disp: 90 tablet, Rfl: 3 .  XIIDRA 5 % SOLN, Apply 1 drop to eye daily. , Disp: , Rfl:  Social History   Socioeconomic History  . Marital status: Widowed    Spouse name: [name]   . Number of children: 1  . Years of education: 70  . Highest education level: Not on file  Occupational History  . Occupation: Retired    Comment: Social research officer, government x 27 years  . 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 Smoking status: Former Smoker    Packs/day: 1.00    Years: 18.00    Pack years: 18.00    Types: Cigarettes    Start date: 12/17/1948    Quit date: 12/17/1966    Years since quitting: 53.4  . Smokeless tobacco: Never Used  Vaping Use  . Vaping Use: Never used  Substance and Sexual Activity  . Alcohol use: Yes    Alcohol/week: 1.0 standard drink    Types: 1 Standard drinks or equivalent per week    Comment: Occasionally  . Drug use: No  . Sexual activity: Not on file  Other Topics Concern  . Not on file  Social History Narrative   Married.  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 Difficulty of Paying Living Expenses:   Food Insecurity:   . Worried About Charity fundraiser in the Last Year:   . Arboriculturist in the Last Year:   Transportation Needs:   . Film/video editor (Medical):   Marland Kitchen Lack of Transportation (Non-Medical):   Physical Activity:   . Days of Exercise per Week:   . Minutes of Exercise per Session:   Stress:   . Feeling of Stress :   Social Connections:    . Frequency of Communication with Friends and Family:   . Frequency of Social Gatherings with Friends and Family:   . Attends Religious Services:   . Active Member of Clubs or Organizations:   . Attends Archivist Meetings:   Marland Kitchen Marital Status:   Intimate Partner Violence:   . Fear of Current or Ex-Partner:   . Emotionally Abused:   Marland Kitchen Physically Abused:   .  Sexually Abused:    Family History  Problem Relation Age of Onset  . Dementia Mother   . Arthritis Mother   . Cancer Brother        prostate  . Asthma Sister   . Heart disease Paternal Aunt   . Heart disease Paternal Uncle   . Hypertension Maternal Grandmother   . CVA Maternal Grandmother   . CVA Paternal Grandmother   . Hepatitis C Son   . Arthritis Son   . Colon cancer Neg Hx   . Colon polyps Neg Hx   . Kidney disease Neg Hx   . Esophageal cancer Neg Hx   . Gallbladder disease Neg Hx   . Diabetes Neg Hx     Objective: Office vital signs reviewed. BP 138/77   Pulse (!) 57   Temp 98.1 F (36.7 C)   Ht 6' (1.829 m)   Wt 172 lb 9.6 oz (78.3 kg)   SpO2 98%   BMI 23.41 kg/m   Physical Examination:  General: Awake, alert, well nourished, No acute distress Cardio: Irregularly irregular with rate control, S1-S2 heard.  No murmurs. Pulm: Normal work of breathing on room air; clear to auscultation bilaterally.  Normal work of breathing on room air.  No wheezes, rhonchi or rales. Right knee: Evidence of osteoarthritis present   Assessment/ Plan: 86 y.o. male   1. Chronic atrial fibrillation (HCC) INR is subtherapeutic.  Increase to 1 tablet daily except for Mondays and Wednesdays.  He will follow-up in 1 week at 10:30 in the morning for repeat INR testing - CoaguChek XS/INR Waived - POCT INR  2. Chronic anticoagulation - CoaguChek XS/INR Waived - POCT INR  3. Essential hypertension Controlled - POCT INR  4. Sleep difficulties Okay to start melatonin 3 mg nightly  5. Chronic pain of right  knee I have given him his orthopedics information and he will contact them for an appointment   No orders of the defined types were placed in this encounter.  No orders of the defined types were placed in this encounter.    [name], DO [place] [phone]

## 2020-06-09 ENCOUNTER — Encounter: Payer: Self-pay | Admitting: Family Medicine

## 2020-06-09 ENCOUNTER — Other Ambulatory Visit: Payer: Self-pay

## 2020-06-09 ENCOUNTER — Ambulatory Visit (INDEPENDENT_AMBULATORY_CARE_PROVIDER_SITE_OTHER): Payer: Medicare Other | Admitting: Family Medicine

## 2020-06-09 VITALS — BP 123/68 | HR 72 | Temp 97.6°F | Ht 72.0 in | Wt 171.8 lb

## 2020-06-09 DIAGNOSIS — Z7901 Long term (current) use of anticoagulants: Secondary | ICD-10-CM | POA: Diagnosis not present

## 2020-06-09 DIAGNOSIS — I482 Chronic atrial fibrillation, unspecified: Secondary | ICD-10-CM

## 2020-06-09 LAB — POCT INR: INR: 2.1 (ref 2–3)

## 2020-06-09 LAB — COAGUCHEK XS/INR WAIVED
INR: 2.1 — ABNORMAL HIGH (ref 0.9–1.1)
Prothrombin Time: 25.3 s

## 2020-06-09 NOTE — Progress Notes (Signed)
Subjective: CC: f/u afib PCP: [name], DO HPI:Dalton Vaughan is a 86 y.o. male presenting to clinic today for:  1.  Atrial fibrillation  Allergic reaction to Eliquis in past.  Did home INR but a hard stick so comes to office now.  Goal 2-3.  Patient has been compliant with Coumadin 5 mg daily except for Mondays and Wednesdays he takes 2.5 mg daily.  He denies any hematochezia, melena.  Overall he is stable from last checkup.    ROS: Per HPI  Allergies  Allergen Reactions  . Paxil [Paroxetine Hcl] Palpitations  . Eliquis [Apixaban] Other (See Comments)    dizziness  . Penicillins Other (See Comments)    Did it involve swelling of the face/tongue/throat, SOB, or low BP? Unknown Did it involve sudden or severe rash/hives, skin peeling, or any reaction on the inside of your mouth or nose? Unknown Did you need to seek medical attention at a hospital or doctor's office? Unknown When did it last happen?unk If all above answers are "NO", may proceed with cephalosporin use.   Christy Sartorius Sodium] Other (See Comments)    myalgias  . Zetia [Ezetimibe] Other (See Comments)    myalgias  . Vytorin [Ezetimibe-Simvastatin] Other (See Comments)    myalgias   Past Medical History:  Diagnosis Date  . Anal fissure   . Atrial fibrillation ([place])   . BPH (benign prostatic hypertrophy)   . CAD (coronary artery disease)    Dr. Wynonia Lawman  . Cataract   . COVID-19   . Depression 2004  . Diverticulosis   . GERD (gastroesophageal reflux disease)   . History of TIAs   . Hyperlipidemia   . Hypertension   . Hypertensive heart disease without CHF   . Hypothyroidism   . Internal hemorrhoids   . Lumbar disc disease   . Oral bleeding 08/20/2012   Following molar tooth extraction July, 2013  . Ruptured disk 1995  . Shingles   . Thyroid disease   . Vitamin D deficiency   . Vitreous detachment     Current Outpatient Medications:  .  amLODipine (NORVASC) 10 MG tablet,  Take 1 tablet (10 mg total) by mouth daily., Disp: 90 tablet, Rfl: 3 .  Calcium Carb-Cholecalciferol (CALCIUM + D3) 600-200 MG-UNIT TABS, Take 1 tablet by mouth daily. , Disp: , Rfl:  .  Cholecalciferol (VITAMIN D-3) 5000 UNITS TABS, Take 1 tablet by mouth daily. , Disp: , Rfl:  .  doxazosin (CARDURA) 8 MG tablet, Take 1 tablet (8 mg total) by mouth at bedtime., Disp: 90 tablet, Rfl: 3 .  doxycycline (VIBRA-TABS) 100 MG tablet, Take 1 tablet (100 mg total) by mouth 2 (two) times daily. 1 po bid, Disp: 28 tablet, Rfl: 0 .  levothyroxine (SYNTHROID) 50 MCG tablet, TAKE 1 TABLET DAILY BEFORE BREAKFAST, Disp: 30 tablet, Rfl: 0 .  metoprolol succinate (TOPROL-XL) 25 MG 24 hr tablet, Take 1 tablet (25 mg total) by mouth daily., Disp: 90 tablet, Rfl: 0 .  mirtazapine (REMERON) 15 MG tablet, Take 1 tablet (15 mg total) by mouth at bedtime., Disp: 90 tablet, Rfl: 1 .  Multiple Vitamin (MULTIVITAMIN) tablet, Take 1 tablet by mouth daily.  , Disp: , Rfl:  .  ramipril (ALTACE) 10 MG capsule, Take 1 capsule (10 mg total) by mouth daily., Disp: 90 capsule, Rfl: 1 .  rosuvastatin (CRESTOR) 5 MG tablet, Take 1 tablet (5 mg total) by mouth daily. as directed (Patient taking differently: Take 5 mg by  mouth every Monday, Wednesday, and Friday. ), Disp: 90 tablet, Rfl: 3 .  triamterene-hydrochlorothiazide (MAXZIDE-25) 37.5-25 MG tablet, TAKE 1 TABLET DAILY, Disp: 90 tablet, Rfl: 3 .  vitamin C (ASCORBIC ACID) 500 MG tablet, Take 500 mg by mouth daily. , Disp: , Rfl:  .  warfarin (COUMADIN) 5 MG tablet, TAKE AS DIRECTED, Disp: 90 tablet, Rfl: 3 .  XIIDRA 5 % SOLN, Apply 1 drop to eye daily. , Disp: , Rfl:  Social History   Socioeconomic History  . Marital status: Widowed    Spouse name: [name]   . Number of children: 1  . Years of education: 4  . Highest education level: Not on file  Occupational History  . Occupation: Retired    Comment: Social research officer, government x 27 years  . 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 Smoking status: Former Smoker    Packs/day: 1.00    Years: 18.00    Pack years: 18.00    Types: Cigarettes    Start date: 12/17/1948    Quit date: 12/17/1966    Years since quitting: 53.5  . Smokeless tobacco: Never Used  Vaping Use  . Vaping Use: Never used  Substance and Sexual Activity  . Alcohol use: Yes    Alcohol/week: 1.0 standard drink    Types: 1 Standard drinks or equivalent per week    Comment: Occasionally  . Drug use: No  . Sexual activity: Not on file  Other Topics Concern  . Not on file  Social History Narrative   Married.  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 Difficulty of Paying Living Expenses:   Food Insecurity:   . Worried About Charity fundraiser in the Last Year:   . Arboriculturist in the Last Year:   Transportation Needs:   . Film/video editor (Medical):   Marland Kitchen Lack of Transportation (Non-Medical):   Physical Activity:   . Days of Exercise per Week:   . Minutes of Exercise per Session:   Stress:   . Feeling of Stress :   Social Connections:   . Frequency of Communication with Friends and Family:   . Frequency of Social Gatherings with Friends and Family:   . Attends Religious Services:   . Active Member of Clubs or Organizations:   . Attends Archivist Meetings:   Marland Kitchen Marital Status:   Intimate Partner Violence:   . Fear of Current or Ex-Partner:   . Emotionally Abused:   Marland Kitchen Physically Abused:   . Sexually Abused:    Family History  Problem Relation Age of Onset  . Dementia Mother   . Arthritis Mother   . Cancer Brother        prostate  . Asthma Sister   . Heart disease Paternal Aunt   . Heart disease Paternal Uncle   . Hypertension Maternal Grandmother   . CVA Maternal Grandmother   . CVA Paternal Grandmother   . Hepatitis C Son     . Arthritis Son   . Colon cancer Neg Hx   . Colon polyps Neg Hx   . Kidney disease Neg Hx   . Esophageal cancer Neg Hx   . Gallbladder disease Neg Hx   .  Diabetes Neg Hx     Objective: Office vital signs reviewed. BP 123/68   Pulse 72   Temp 97.6 F (36.4 C)   Ht 6' (1.829 m)   Wt 171 lb 12.8 oz (77.9 kg)   SpO2 98%   BMI 23.30 kg/m   Physical Examination:  General: Awake, alert, well nourished, No acute distress Cardio: Irregularly irregular with rate control  Pulm: Normal work of breathing on room air.  MSK: Antalgic gait.  Walks with a limp   Assessment/ Plan: 86 y.o. male   1. Chronic atrial fibrillation (HCC) INR is therapeutic at 2.1.  Continue current regimen with 1 tablet daily except for Mondays and Wednesdays, he takes 1/2 tablet daily.  Okay to follow-up in 4 weeks, sooner if needed - CoaguChek XS/INR Waived - POCT INR  2. Chronic anticoagulation - POCT INR   Orders Placed This Encounter  Procedures  . INR   No orders of the defined types were placed in this encounter.    [name], DO [place] [phone]

## 2020-07-05 ENCOUNTER — Other Ambulatory Visit: Payer: Self-pay | Admitting: Family Medicine

## 2020-07-05 DIAGNOSIS — I700 Atherosclerosis of aorta: Secondary | ICD-10-CM

## 2020-07-07 ENCOUNTER — Ambulatory Visit (INDEPENDENT_AMBULATORY_CARE_PROVIDER_SITE_OTHER): Payer: Medicare Other | Admitting: Family Medicine

## 2020-07-07 ENCOUNTER — Other Ambulatory Visit: Payer: Medicare Other

## 2020-07-07 ENCOUNTER — Ambulatory Visit: Payer: Medicare Other

## 2020-07-07 ENCOUNTER — Encounter: Payer: Self-pay | Admitting: Family Medicine

## 2020-07-07 ENCOUNTER — Other Ambulatory Visit: Payer: Self-pay

## 2020-07-07 VITALS — BP 132/71 | HR 68 | Temp 97.9°F | Ht 72.0 in | Wt 171.2 lb

## 2020-07-07 DIAGNOSIS — Z7901 Long term (current) use of anticoagulants: Secondary | ICD-10-CM | POA: Diagnosis not present

## 2020-07-07 DIAGNOSIS — R001 Bradycardia, unspecified: Secondary | ICD-10-CM

## 2020-07-07 DIAGNOSIS — I482 Chronic atrial fibrillation, unspecified: Secondary | ICD-10-CM

## 2020-07-07 DIAGNOSIS — M25561 Pain in right knee: Secondary | ICD-10-CM

## 2020-07-07 DIAGNOSIS — M25562 Pain in left knee: Secondary | ICD-10-CM

## 2020-07-07 LAB — COAGUCHEK XS/INR WAIVED
INR: 2.2 — ABNORMAL HIGH (ref 0.9–1.1)
Prothrombin Time: 26.8 s

## 2020-07-07 LAB — HEMOGLOBIN, FINGERSTICK: Hemoglobin: 15.6 g/dL (ref 12.6–17.7)

## 2020-07-07 NOTE — Progress Notes (Signed)
Subjective: CC: f/u afib PCP: [name], DO HPI:Joshua Apt is a 86 y.o. male presenting to clinic today for:  1.  Atrial fibrillation  Allergic reaction to Eliquis in past.  Did home INR but a hard stick so comes to office now.  Goal 2-3.  Patient compliant with Coumadin 5 mg daily except for Mondays and Wednesdays he takes 2.5 mg daily.  No hematochezia, melena, epistaxis, shortness of breath or edema.  Occasional heart palpitations that are nonsustained.  He has been having some low heart rate since he tried to kick sugar and salt.  Heart rates have gone as far as 40s.  Sometimes he will have some visual disturbance associated with these low heart rates.  He has a checkup coming up with his cardiologist in about 2 weeks.  No loss of consciousness.  He has discontinued his metoprolol totally.    ROS: Per HPI  Allergies  Allergen Reactions   Paxil [Paroxetine Hcl] Palpitations   Eliquis [Apixaban] Other (See Comments)    dizziness   Penicillins Other (See Comments)    Did it involve swelling of the face/tongue/throat, SOB, or low BP? Unknown Did it involve sudden or severe rash/hives, skin peeling, or any reaction on the inside of your mouth or nose? Unknown Did you need to seek medical attention at a hospital or doctor's office? Unknown When did it last happen?unk If all above answers are NO, may proceed with cephalosporin use.    Pravachol [Pravastatin Sodium] Other (See Comments)    myalgias   Zetia [Ezetimibe] Other (See Comments)    myalgias   Vytorin [Ezetimibe-Simvastatin] Other (See Comments)    myalgias   Past Medical History:  Diagnosis Date   Anal fissure    Atrial fibrillation (HCC)    BPH (benign prostatic hypertrophy)    CAD (coronary artery disease)    Dr. Wynonia Lawman   Cataract    COVID-19    Depression 2004   Diverticulosis    GERD (gastroesophageal reflux disease)    History of TIAs    Hyperlipidemia    Hypertension     Hypertensive heart disease without CHF    Hypothyroidism    Internal hemorrhoids    Lumbar disc disease    Oral bleeding 08/20/2012   Following molar tooth extraction July, 2013   Ruptured disk 1995   Shingles    Thyroid disease    Vitamin D deficiency    Vitreous detachment     Current Outpatient Medications:    amLODipine (NORVASC) 10 MG tablet, Take 1 tablet (10 mg total) by mouth daily., Disp: 90 tablet, Rfl: 3   Calcium Carb-Cholecalciferol (CALCIUM + D3) 600-200 MG-UNIT TABS, Take 1 tablet by mouth daily. , Disp: , Rfl:    Cholecalciferol (VITAMIN D-3) 5000 UNITS TABS, Take 1 tablet by mouth daily. , Disp: , Rfl:    doxazosin (CARDURA) 8 MG tablet, Take 1 tablet (8 mg total) by mouth at bedtime., Disp: 90 tablet, Rfl: 3   doxycycline (VIBRA-TABS) 100 MG tablet, Take 1 tablet (100 mg total) by mouth 2 (two) times daily. 1 po bid, Disp: 28 tablet, Rfl: 0   levothyroxine (SYNTHROID) 50 MCG tablet, TAKE 1 TABLET DAILY BEFORE BREAKFAST, Disp: 30 tablet, Rfl: 0   mirtazapine (REMERON) 15 MG tablet, Take 1 tablet (15 mg total) by mouth at bedtime., Disp: 90 tablet, Rfl: 1   Multiple Vitamin (MULTIVITAMIN) tablet, Take 1 tablet by mouth daily.  , Disp: , Rfl:    ramipril (  ALTACE) 10 MG capsule, TAKE 1 CAPSULE DAILY, Disp: 90 capsule, Rfl: 0   rosuvastatin (CRESTOR) 5 MG tablet, Take 1 tablet (5 mg total) by mouth daily. as directed (Patient taking differently: Take 5 mg by mouth every Monday, Wednesday, and Friday. ), Disp: 90 tablet, Rfl: 3   triamterene-hydrochlorothiazide (MAXZIDE-25) 37.5-25 MG tablet, TAKE 1 TABLET DAILY, Disp: 90 tablet, Rfl: 3   vitamin C (ASCORBIC ACID) 500 MG tablet, Take 500 mg by mouth daily. , Disp: , Rfl:    warfarin (COUMADIN) 5 MG tablet, TAKE AS DIRECTED, Disp: 90 tablet, Rfl: 3   XIIDRA 5 % SOLN, Apply 1 drop to eye daily. 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, music at [employer]      Right-handed      Caffeine use: none   Social Determinants of Radio broadcast assistant Strain:    Difficulty of Paying Living Expenses:   Food Insecurity:    Worried About Charity fundraiser in the Last Year:    Arboriculturist in the Last Year:   Transportation Needs:    Film/video editor (Medical):    Lack of Transportation (Non-Medical):   Physical Activity:    Days of Exercise per Week:    Minutes of Exercise per Session:   Stress:    Feeling of Stress :   Social Connections:    Frequency of Communication with Friends and Family:    Frequency of Social Gatherings with Friends and Family:    Attends Religious Services:    Active Member of Clubs or Organizations:    Attends Music therapist:    Marital Status:   Intimate Partner Violence:    Fear of Current or Ex-Partner:    Emotionally Abused:    Physically Abused:     Sexually Abused:    Family History  Problem Relation Age of Onset   Dementia Mother    Arthritis Mother    Cancer Brother        prostate   Asthma Sister    Heart disease Paternal Aunt    Heart disease Paternal Uncle    Hypertension Maternal Grandmother  CVA Maternal Grandmother    CVA Paternal Grandmother    Hepatitis C Son    Arthritis Son    Colon cancer Neg Hx    Colon polyps Neg Hx    Kidney disease Neg Hx    Esophageal cancer Neg Hx    Gallbladder disease Neg Hx    Diabetes Neg Hx     Objective: Office vital signs reviewed. BP (!) 132/71    Pulse 68    Temp 97.9 F (36.6 C)    Ht 6' (1.829 m)    Wt 171 lb 3.2 oz (77.7 kg)    SpO2 98%    BMI 23.22 kg/m   Physical Examination:  General: Awake, alert, well nourished, No acute distress Cardio: Irregularly irregular with mildly bradycardic right; no appreciated murmurs Pulm: Clear to auscultation bilaterally.  No wheezes, rhonchi or rales.  Normal work of breathing on room air.  MSK: Antalgic gait.    Assessment/ Plan: 86 y.o. male   1. Chronic atrial fibrillation (HCC) Irregularly irregular with rate control.  Heart rate is in the 60s here today. - CoaguChek XS/INR Waived - Hemoglobin, fingerstick  2. Chronic anticoagulation INR therapeutic.  Follow-up in 6 weeks - CoaguChek XS/INR Waived  3. Bradycardia I am concerned about the bradycardia he is experiencing at home.  Heart rate into the 40s most times.  He is having some symptomology with this as well which is concerning.  We discussed red flag signs and symptoms warranting sooner evaluation by myself or cardiology.  Will await cardiology's recommendations.  In the interim, since symptoms seem to be related to cutting back on salt and sugar have asked that he add a little bit of this back in to see if this improves symptoms.   No orders of the defined types were placed in this encounter.  No orders of the defined types were placed in this  encounter.    [name], DO [facility] [phone]

## 2020-07-20 ENCOUNTER — Other Ambulatory Visit: Payer: Self-pay | Admitting: *Deleted

## 2020-07-20 MED ORDER — LEVOTHYROXINE SODIUM 50 MCG PO TABS: 50.0000 ug | ORAL_TABLET | Freq: Every day | ORAL | 2 refills | Status: DC

## 2020-08-02 ENCOUNTER — Ambulatory Visit (INDEPENDENT_AMBULATORY_CARE_PROVIDER_SITE_OTHER): Payer: Medicare Other | Admitting: Family Medicine

## 2020-08-02 ENCOUNTER — Encounter: Payer: Self-pay | Admitting: Family Medicine

## 2020-08-02 DIAGNOSIS — K219 Gastro-esophageal reflux disease without esophagitis: Secondary | ICD-10-CM

## 2020-08-02 DIAGNOSIS — R143 Flatulence: Secondary | ICD-10-CM | POA: Diagnosis not present

## 2020-08-02 NOTE — Progress Notes (Signed)
Virtual Visit via Telephone Note  I connected with Dalton Vaughan on 08/02/20 at 2:02 PM by telephone and verified that I am speaking with the correct person using two identifiers. Dalton Vaughan is currently located at home and his son and grandson are currently with him during this visit. The provider, Loman Brooklyn, FNP is located in their office at time of visit.  I discussed the limitations, risks, security and privacy concerns of performing an evaluation and management service by telephone and the availability of in person appointments. I also discussed with the patient that there may be a patient responsible charge related to this service. The patient expressed understanding and agreed to proceed.  Subjective: PCP: [name], DO  Chief Complaint  Patient presents with  . Cough   Patient is concerned that his chronic cough and gas make his heart rate dropped into the 40s.  He is currently wearing a heart monitor that he has had on for a couple of days now.  Patient reports he takes Gaviscon, simethicone, and Rolaids throughout the day for his gas which are helpful.  States he has tried many other medications in the past and this is what works best for him.  He will follow up with cardiology regarding the heart monitor, but would like a referral to a gastroenterologist about his gas.   ROS: Per HPI  Current Outpatient Medications:  .  amLODipine (NORVASC) 10 MG tablet, Take 1 tablet (10 mg total) by mouth daily., Disp: 90 tablet, Rfl: 3 .  Calcium Carb-Cholecalciferol (CALCIUM + D3) 600-200 MG-UNIT TABS, Take 1 tablet by mouth daily. , Disp: , Rfl:  .  Cholecalciferol (VITAMIN D-3) 5000 UNITS TABS, Take 1 tablet by mouth daily. , Disp: , Rfl:  .  doxazosin (CARDURA) 8 MG tablet, Take 1 tablet (8 mg total) by mouth at bedtime., Disp: 90 tablet, Rfl: 3 .  doxycycline (VIBRA-TABS) 100 MG tablet, Take 1 tablet (100 mg total) by mouth 2 (two) times daily. 1 po bid, Disp: 28 tablet,  Rfl: 0 .  levothyroxine (SYNTHROID) 50 MCG tablet, Take 1 tablet (50 mcg total) by mouth daily before breakfast., Disp: 90 tablet, Rfl: 2 .  metoprolol succinate (TOPROL-XL) 25 MG 24 hr tablet, Take 1 tablet (25 mg total) by mouth daily., Disp: 90 tablet, Rfl: 0 .  mirtazapine (REMERON) 15 MG tablet, Take 1 tablet (15 mg total) by mouth at bedtime., Disp: 90 tablet, Rfl: 1 .  Multiple Vitamin (MULTIVITAMIN) tablet, Take 1 tablet by mouth daily.  , Disp: , Rfl:  .  ramipril (ALTACE) 10 MG capsule, TAKE 1 CAPSULE DAILY, Disp: 90 capsule, Rfl: 0 .  rosuvastatin (CRESTOR) 5 MG tablet, Take 1 tablet (5 mg total) by mouth daily. as directed (Patient taking differently: Take 5 mg by mouth every Monday, Wednesday, and Friday. ), Disp: 90 tablet, Rfl: 3 .  triamterene-hydrochlorothiazide (MAXZIDE-25) 37.5-25 MG tablet, TAKE 1 TABLET DAILY, Disp: 90 tablet, Rfl: 3 .  vitamin C (ASCORBIC ACID) 500 MG tablet, Take 500 mg by mouth daily. , Disp: , Rfl:  .  warfarin (COUMADIN) 5 MG tablet, TAKE AS DIRECTED, Disp: 90 tablet, Rfl: 3 .  XIIDRA 5 % SOLN, Apply 1 drop to eye daily. , Disp: , Rfl:   Allergies  Allergen Reactions  . Paxil [Paroxetine Hcl] Palpitations  . Eliquis [Apixaban] Other (See Comments)    dizziness  . Penicillins Other (See Comments)    Did it involve swelling of the face/tongue/throat, SOB,  or low BP? Unknown Did it involve sudden or severe rash/hives, skin peeling, or any reaction on the inside of your mouth or nose? Unknown Did you need to seek medical attention at a hospital or doctor's office? Unknown When did it last happen?unk If all above answers are "NO", may proceed with cephalosporin use.   Christy Sartorius Sodium] Other (See Comments)    myalgias  . Zetia [Ezetimibe] Other (See Comments)    myalgias  . Vytorin [Ezetimibe-Simvastatin] Other (See Comments)    myalgias   Past Medical History:  Diagnosis Date  . Anal fissure   . Atrial fibrillation ([place])     . BPH (benign prostatic hypertrophy)   . CAD (coronary artery disease)    Dr. Wynonia Lawman  . Cataract   . COVID-19   . Depression 2004  . Diverticulosis   . GERD (gastroesophageal reflux disease)   . History of TIAs   . Hyperlipidemia   . Hypertension   . Hypertensive heart disease without CHF   . Hypothyroidism   . Internal hemorrhoids   . Lumbar disc disease   . Oral bleeding 08/20/2012   Following molar tooth extraction July, 2013  . Ruptured disk 1995  . Shingles   . Thyroid disease   . Vitamin D deficiency   . Vitreous detachment     Observations/Objective: A&O  No respiratory distress or wheezing audible over the phone Mood, judgement, and thought processes all WNL  Assessment and Plan: 1-2. Gastroesophageal reflux disease, unspecified whether esophagitis present/Excessive gas - Discussed alternative medications with patient, but he does not wish to change his medication until he sees a gastroenterologist. - Ambulatory referral to Gastroenterology   Follow Up Instructions:  I discussed the assessment and treatment plan with the patient. The patient was provided an opportunity to ask questions and all were answered. The patient agreed with the plan and demonstrated an understanding of the instructions.   The patient was advised to call back or seek an in-person evaluation if the symptoms worsen or if the condition fails to improve as anticipated.  The above assessment and management plan was discussed with the patient. The patient verbalized understanding of and has agreed to the management plan. Patient is aware to call the clinic if symptoms persist or worsen. Patient is aware when to return to the clinic for a follow-up visit. Patient educated on when it is appropriate to go to the emergency department.   Time call ended: 2:14 PM  I provided 14 minutes of non-face-to-face time during this encounter.  Hendricks Limes, MSN, APRN, FNP-C [facility] 08/02/20

## 2020-08-08 ENCOUNTER — Other Ambulatory Visit: Payer: Self-pay

## 2020-08-08 DIAGNOSIS — F321 Major depressive disorder, single episode, moderate: Secondary | ICD-10-CM

## 2020-08-08 MED ORDER — MIRTAZAPINE 15 MG PO TABS: 15.0000 mg | ORAL_TABLET | Freq: Every day | ORAL | 1 refills | Status: DC

## 2020-08-09 ENCOUNTER — Encounter: Payer: Self-pay | Admitting: *Deleted

## 2020-08-18 ENCOUNTER — Encounter: Payer: Self-pay | Admitting: Family Medicine

## 2020-08-18 ENCOUNTER — Other Ambulatory Visit: Payer: Self-pay

## 2020-08-18 ENCOUNTER — Ambulatory Visit (INDEPENDENT_AMBULATORY_CARE_PROVIDER_SITE_OTHER): Payer: Medicare Other | Admitting: Family Medicine

## 2020-08-18 VITALS — BP 127/66 | HR 60 | Temp 97.7°F | Ht 72.0 in | Wt 165.6 lb

## 2020-08-18 DIAGNOSIS — R001 Bradycardia, unspecified: Secondary | ICD-10-CM

## 2020-08-18 DIAGNOSIS — R143 Flatulence: Secondary | ICD-10-CM

## 2020-08-18 DIAGNOSIS — I482 Chronic atrial fibrillation, unspecified: Secondary | ICD-10-CM | POA: Diagnosis not present

## 2020-08-18 DIAGNOSIS — Z7901 Long term (current) use of anticoagulants: Secondary | ICD-10-CM | POA: Diagnosis not present

## 2020-08-18 LAB — COAGUCHEK XS/INR WAIVED
INR: 1.9 — ABNORMAL HIGH (ref 0.9–1.1)
Prothrombin Time: 22.3 s

## 2020-08-18 LAB — POCT INR: INR: 1.9 — AB (ref 2–3)

## 2020-08-18 MED ORDER — DOXAZOSIN MESYLATE 8 MG PO TABS: 8.0000 mg | ORAL_TABLET | Freq: Every day | ORAL | 3 refills | Status: DC

## 2020-08-18 NOTE — Patient Instructions (Signed)
Abdominal Bloating When you have abdominal bloating, your abdomen may feel full, tight, or painful. It may also look bigger than normal or swollen (distended). Common causes of abdominal bloating include:  Swallowing air.  Constipation.  Problems digesting food.  Eating too much.  Irritable bowel syndrome. This is a condition that affects the large intestine.  Lactose intolerance. This is an inability to digest lactose, a natural sugar in dairy products.  Celiac disease. This is a condition that affects the ability to digest gluten, a protein found in some grains.  Gastroparesis. This is a condition that slows down the movement of food in the stomach and small intestine. It is more common in people with diabetes mellitus.  Gastroesophageal reflux disease (GERD). This is a digestive condition that makes stomach acid flow back into the esophagus.  Urinary retention. This means that the body is holding onto urine, and the bladder cannot be emptied all the way. Follow these instructions at home: Eating and drinking  Avoid eating too much.  Try not to swallow air while talking or eating.  Avoid eating while lying down.  Avoid these foods and drinks: ? Foods that cause gas, such as broccoli, cabbage, cauliflower, and baked beans. ? Carbonated drinks. ? Hard candy. ? Chewing gum. Medicines  Take over-the-counter and prescription medicines only as told by your health care provider.  Take probiotic medicines. These medicines contain live bacteria or yeasts that can help digestion.  Take coated peppermint oil capsules. Activity  Try to exercise regularly. Exercise may help to relieve bloating that is caused by gas and relieve constipation. General instructions  Keep all follow-up visits as told by your health care provider. This is important. Contact a health care provider if:  You have nausea and vomiting.  You have diarrhea.  You have abdominal pain.  You have unusual  weight loss or weight gain.  You have severe pain, and medicines do not help. Get help right away if:  You have severe chest pain.  You have trouble breathing.  You have shortness of breath.  You have trouble urinating.  You have darker urine than normal.  You have blood in your stools or have dark, tarry stools. Summary  Abdominal bloating means that the abdomen is swollen.  Common causes of abdominal bloating are swallowing air, constipation, and problems digesting food.  Avoid eating too much and avoid swallowing air.  Avoid foods that cause gas, carbonated drinks, hard candy, and chewing gum. This information is not intended to replace advice given to you by your health care provider. Make sure you discuss any questions you have with your health care provider. Document Revised: 03/23/2019 Document Reviewed: 01/04/2017 Elsevier Patient Education  [place].

## 2020-08-18 NOTE — Progress Notes (Signed)
Subjective: CC: f/u afib PCP: [name], DO HPI:Dalton Vaughan is a 86 y.o. male presenting to clinic today for:  1.  Atrial fibrillation  Allergic reaction to Eliquis in past.  Did home INR but a hard stick so comes to office now.  Goal 2-3.  Patient reports compliance with 5 mg of Coumadin except for on Mondays and Fridays where he takes 2.5 mg.  He denies any change in diet, change in medications.  No epistaxis, hematochezia or melena.  He does report some heart palpitations, dizziness and increased gas as of late.  He actually saw his cardiologist recently who placed him on a Holter monitor.  It was determined that he likely needs to have a pacemaker placed and this is scheduled for October.  He has an upcoming appointment with his gastroenterologist to address the bloating.  He wonders if he is potentially getting a pacemaker unnecessarily since they seem to be tied together.  Most often the dizziness and bloating are relieved by Gas-X.  Symptoms seem to be more prominent in the evening time but before his doxazosin tablets.  Mirtazapine also seems to help the symptoms.  ROS: Per HPI  Allergies  Allergen Reactions  . Paxil [Paroxetine Hcl] Palpitations  . Eliquis [Apixaban] Other (See Comments)    dizziness  . Penicillins Other (See Comments)    Did it involve swelling of the face/tongue/throat, SOB, or low BP? Unknown Did it involve sudden or severe rash/hives, skin peeling, or any reaction on the inside of your mouth or nose? Unknown Did you need to seek medical attention at a hospital or doctor's office? Unknown When did it last happen?unk If all above answers are "NO", may proceed with cephalosporin use.   Christy Sartorius Sodium] Other (See Comments)    myalgias  . Zetia [Ezetimibe] Other (See Comments)    myalgias  . Vytorin [Ezetimibe-Simvastatin] Other (See Comments)    myalgias   Past Medical History:  Diagnosis Date  . Anal fissure   . Atrial  fibrillation ([place])   . BPH (benign prostatic hypertrophy)   . CAD (coronary artery disease)    Dr. Wynonia Lawman  . Cataract   . COVID-19   . Depression 2004  . Diverticulosis   . GERD (gastroesophageal reflux disease)   . History of TIAs   . Hyperlipidemia   . Hypertension   . Hypertensive heart disease without CHF   . Hypothyroidism   . Internal hemorrhoids   . Lumbar disc disease   . Oral bleeding 08/20/2012   Following molar tooth extraction July, 2013  . Ruptured disk 1995  . Shingles   . Thyroid disease   . Vitamin D deficiency   . Vitreous detachment     Current Outpatient Medications:  .  amLODipine (NORVASC) 10 MG tablet, Take 1 tablet (10 mg total) by mouth daily., Disp: 90 tablet, Rfl: 3 .  Calcium Carb-Cholecalciferol (CALCIUM + D3) 600-200 MG-UNIT TABS, Take 1 tablet by mouth daily. , Disp: , Rfl:  .  Cholecalciferol (VITAMIN D-3) 5000 UNITS TABS, Take 1 tablet by mouth daily. , Disp: , Rfl:  .  doxazosin (CARDURA) 8 MG tablet, Take 1 tablet (8 mg total) by mouth at bedtime., Disp: 90 tablet, Rfl: 3 .  doxycycline (VIBRA-TABS) 100 MG tablet, Take 1 tablet (100 mg total) by mouth 2 (two) times daily. 1 po bid, Disp: 28 tablet, Rfl: 0 .  levothyroxine (SYNTHROID) 50 MCG tablet, Take 1 tablet (50 mcg total) by mouth  daily before breakfast., Disp: 90 tablet, Rfl: 2 .  mirtazapine (REMERON) 15 MG tablet, Take 1 tablet (15 mg total) by mouth at bedtime., Disp: 90 tablet, Rfl: 1 .  Multiple Vitamin (MULTIVITAMIN) tablet, Take 1 tablet by mouth daily.  , Disp: , Rfl:  .  ramipril (ALTACE) 10 MG capsule, TAKE 1 CAPSULE DAILY, Disp: 90 capsule, Rfl: 0 .  rosuvastatin (CRESTOR) 5 MG tablet, Take 1 tablet (5 mg total) by mouth daily. as directed (Patient taking differently: Take 5 mg by mouth every Monday, Wednesday, and Friday. ), Disp: 90 tablet, Rfl: 3 .  triamterene-hydrochlorothiazide (MAXZIDE-25) 37.5-25 MG tablet, TAKE 1 TABLET DAILY, Disp: 90 tablet, Rfl: 3 .  vitamin C (ASCORBIC  ACID) 500 MG tablet, Take 500 mg by mouth daily. , Disp: , Rfl:  .  warfarin (COUMADIN) 5 MG tablet, TAKE AS DIRECTED, Disp: 90 tablet, Rfl: 3 .  XIIDRA 5 % SOLN, Apply 1 drop to eye daily. , Disp: , Rfl:  .  metoprolol succinate (TOPROL-XL) 25 MG 24 hr tablet, Take 1 tablet (25 mg total) by mouth daily., Disp: 90 tablet, Rfl: 0 Social History   Socioeconomic History  . Marital status: Widowed    Spouse name: [name]   . Number of children: 1  . Years of education: 48  . Highest education level: Not on file  Occupational History  . Occupation: Retired    Comment: Social research officer, government x 27 years  . 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 Smoking status: Former Smoker    Packs/day: 1.00    Years: 18.00    Pack years: 18.00    Types: Cigarettes    Start date: 12/17/1948    Quit date: 12/17/1966    Years since quitting: 53.7  . Smokeless tobacco: Never Used  Vaping Use  . Vaping Use: Never used  Substance and Sexual Activity  . Alcohol use: Yes    Alcohol/week: 1.0 standard drink    Types: 1 Standard drinks or equivalent per week    Comment: Occasionally  . Drug use: No  . Sexual activity: Not on file  Other Topics Concern  . Not on file  Social History Narrative   Married.  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 Difficulty of Paying Living Expenses: Not on file  Food Insecurity:   . Worried About Charity fundraiser in the Last Year: Not on file  . Ran Out of Food in the Last Year: Not on file  Transportation Needs:   . Lack of Transportation (Medical): Not on file  . Lack of Transportation (Non-Medical): Not on file  Physical Activity:   . Days of Exercise per Week: Not on file  . Minutes of Exercise per Session: Not on file  Stress:   . Feeling of Stress : Not on file  Social  Connections:   . Frequency of Communication with Friends and Family: Not on file  . Frequency of Social Gatherings with Friends and Family: Not on file  . Attends Religious Services: Not on file  . Active Member of Clubs or Organizations: Not on file  . Attends Archivist Meetings: Not on file  . Marital Status: Not on file  Intimate Partner Violence:   .  Fear of Current or Ex-Partner: Not on file  . Emotionally Abused: Not on file  . Physically Abused: Not on file  . Sexually Abused: Not on file   Family History  Problem Relation Age of Onset  . Dementia Mother   . Arthritis Mother   . Cancer Brother        prostate  . Asthma Sister   . Heart disease Paternal Aunt   . Heart disease Paternal Uncle   . Hypertension Maternal Grandmother   . CVA Maternal Grandmother   . CVA Paternal Grandmother   . Hepatitis C Son   . Arthritis Son   . Colon cancer Neg Hx   . Colon polyps Neg Hx   . Kidney disease Neg Hx   . Esophageal cancer Neg Hx   . Gallbladder disease Neg Hx   . Diabetes Neg Hx     Objective: Office vital signs reviewed. BP 127/66   Pulse 60   Temp 97.7 F (36.5 C)   Ht 6' (1.829 m)   Wt 165 lb 9.6 oz (75.1 kg)   SpO2 99%   BMI 22.46 kg/m   Physical Examination:  General: Awake, alert, well nourished, No acute distress Cardio: Irregularly irregular with mildly bradycardic right; no appreciated murmurs Pulm: Clear to auscultation bilaterally.  No wheezes, rhonchi or rales.  Normal work of breathing on room air.   Assessment/ Plan: 86 y.o. male   1. Chronic atrial fibrillation (HCC) INR subtherapeutic at 1.9.  Increase Friday dose to 1 tablet (5 mg) and then resume normal daily dosing.  I would like to see him back next Friday at appointment scheduled.  He is aware of date and time. - CoaguChek XS/INR Waived - POCT INR  2. Chronic anticoagulation - CoaguChek XS/INR Waived - POCT INR  3. Bradycardia Stable.  Plan for pacemaker.  I discussed  with him I do not think that gas would cause a prolonged heart pause and I did recommend that he proceed with pacemaker placement - POCT INR  4. Excessive gas Continue simethicone as needed.  Agree with follow-up with gastroenterology since this is causing him obvious stress and seems to be escalating over the last several years.  Follow-up as needed   Orders Placed This Encounter  Procedures  . CoaguChek XS/INR Waived   No orders of the defined types were placed in this encounter.    [name], DO [place] [phone]

## 2020-08-18 NOTE — Addendum Note (Signed)
Addended by: [name] on: 08/18/2020 03:05 PM   Modules accepted: Orders

## 2020-08-19 ENCOUNTER — Ambulatory Visit: Payer: Medicare Other | Admitting: Family Medicine

## 2020-08-23 ENCOUNTER — Other Ambulatory Visit: Payer: Self-pay | Admitting: Pharmacist Clinician (PhC)/ Clinical Pharmacy Specialist

## 2020-08-26 ENCOUNTER — Encounter: Payer: Self-pay | Admitting: Family Medicine

## 2020-08-26 ENCOUNTER — Other Ambulatory Visit: Payer: Self-pay

## 2020-08-26 ENCOUNTER — Ambulatory Visit (INDEPENDENT_AMBULATORY_CARE_PROVIDER_SITE_OTHER): Payer: Medicare Other | Admitting: Family Medicine

## 2020-08-26 VITALS — BP 133/84 | HR 55 | Temp 97.9°F | Ht 72.0 in | Wt 165.4 lb

## 2020-08-26 DIAGNOSIS — Z7901 Long term (current) use of anticoagulants: Secondary | ICD-10-CM | POA: Diagnosis not present

## 2020-08-26 DIAGNOSIS — S76311A Strain of muscle, fascia and tendon of the posterior muscle group at thigh level, right thigh, initial encounter: Secondary | ICD-10-CM

## 2020-08-26 DIAGNOSIS — I482 Chronic atrial fibrillation, unspecified: Secondary | ICD-10-CM

## 2020-08-26 LAB — COAGUCHEK XS/INR WAIVED
INR: 2.3 — ABNORMAL HIGH (ref 0.9–1.1)
Prothrombin Time: 27.5 s

## 2020-08-26 NOTE — Progress Notes (Signed)
Subjective: CC: f/u afib PCP: [name], DO HPI:Nimai Woolum is a 86 y.o. male presenting to clinic today for:  1.  Atrial fibrillation  Allergic reaction to Eliquis in past.  Did home INR but a hard stick so comes to office now.  Goal 2-3.  At last visit his INR was subtherapeutic.  His Coumadin was increased to 1 tablet daily all days except for Mondays, where he would take 1/2 tablet.  He has been compliant with this regimen.  Denies any epistaxis, hematochezia or melena.  2.  Thigh pain Patient reports couple day history of right-sided posterior thigh pain.  He experiences after he started working out again.  He thinks he strained it but it does seem to be gradually getting better.  ROS: Per HPI  Allergies  Allergen Reactions   Paxil [Paroxetine Hcl] Palpitations   Eliquis [Apixaban] Other (See Comments)    dizziness   Penicillins Other (See Comments)    Did it involve swelling of the face/tongue/throat, SOB, or low BP? Unknown Did it involve sudden or severe rash/hives, skin peeling, or any reaction on the inside of your mouth or nose? Unknown Did you need to seek medical attention at a hospital or doctor's office? Unknown When did it last happen?unk If all above answers are NO, may proceed with cephalosporin use.    Pravachol [Pravastatin Sodium] Other (See Comments)    myalgias   Zetia [Ezetimibe] Other (See Comments)    myalgias   Vytorin [Ezetimibe-Simvastatin] Other (See Comments)    myalgias   Past Medical History:  Diagnosis Date   Anal fissure    Atrial fibrillation (HCC)    BPH (benign prostatic hypertrophy)    CAD (coronary artery disease)    Dr. Wynonia Lawman   Cataract    COVID-19    Depression 2004   Diverticulosis    GERD (gastroesophageal reflux disease)    History of TIAs    Hyperlipidemia    Hypertension    Hypertensive heart disease without CHF    Hypothyroidism    Internal hemorrhoids    Lumbar disc  disease    Oral bleeding 08/20/2012   Following molar tooth extraction July, 2013   Ruptured disk 1995   Shingles    Thyroid disease    Vitamin D deficiency    Vitreous detachment     Current Outpatient Medications:    amLODipine (NORVASC) 10 MG tablet, Take 1 tablet (10 mg total) by mouth daily., Disp: 90 tablet, Rfl: 3   Calcium Carb-Cholecalciferol (CALCIUM + D3) 600-200 MG-UNIT TABS, Take 1 tablet by mouth daily. , Disp: , Rfl:    Cholecalciferol (VITAMIN D-3) 5000 UNITS TABS, Take 1 tablet by mouth daily. , Disp: , Rfl:    doxazosin (CARDURA) 8 MG tablet, Take 1 tablet (8 mg total) by mouth at bedtime., Disp: 90 tablet, Rfl: 3   doxycycline (VIBRA-TABS) 100 MG tablet, Take 1 tablet (100 mg total) by mouth 2 (two) times daily. 1 po bid, Disp: 28 tablet, Rfl: 0   levothyroxine (SYNTHROID) 50 MCG tablet, Take 1 tablet (50 mcg total) by mouth daily before breakfast., Disp: 90 tablet, Rfl: 2   metoprolol succinate (TOPROL-XL) 25 MG 24 hr tablet, Take 1 tablet (25 mg total) by mouth daily., Disp: 90 tablet, Rfl: 0   mirtazapine (REMERON) 15 MG tablet, Take 1 tablet (15 mg total) by mouth at bedtime., Disp: 90 tablet, Rfl: 1   Multiple Vitamin (MULTIVITAMIN) tablet, Take 1 tablet by mouth daily.  ,  Disp: , Rfl:    ramipril (ALTACE) 10 MG capsule, TAKE 1 CAPSULE DAILY, Disp: 90 capsule, Rfl: 0   rosuvastatin (CRESTOR) 5 MG tablet, Take 1 tablet (5 mg total) by mouth daily. as directed (Patient taking differently: Take 5 mg by mouth every Monday, Wednesday, and Friday. ), Disp: 90 tablet, Rfl: 3   triamterene-hydrochlorothiazide (MAXZIDE-25) 37.5-25 MG tablet, TAKE 1 TABLET DAILY, Disp: 90 tablet, Rfl: 0   vitamin C (ASCORBIC ACID) 500 MG tablet, Take 500 mg by mouth daily. , Disp: , Rfl:    warfarin (COUMADIN) 5 MG tablet, TAKE AS DIRECTED, Disp: 90 tablet, Rfl: 3   XIIDRA 5 % SOLN, Apply 1 drop to eye daily. 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, music at [employer]      Right-handed      Caffeine use: none   Social Determinants of Radio broadcast assistant Strain:    Difficulty of Paying Living Expenses: Not on file  Food Insecurity:    Worried About Charity fundraiser in the Last Year: Not on file   [employer] of Food in the Last Year: Not on file  Transportation Needs:    Film/video editor (Medical): Not on file   Lack of Transportation (Non-Medical): Not on file  Physical Activity:    Days of Exercise per Week: Not on file   Minutes of Exercise per Session: Not on file  Stress:    Feeling of Stress : Not on file  Social Connections:    Frequency of Communication with Friends and Family: Not on file   Frequency of Social Gatherings with Friends and Family: Not on file   Attends Religious Services: Not on file   Active Member of Clubs or Organizations: Not on file   Attends Archivist Meetings: Not on file   Marital Status: Not on file  Intimate Partner Violence:    Fear of  Current or Ex-Partner: Not on file   Emotionally Abused: Not on file   Physically Abused: Not on file   Sexually Abused: Not on file   Family History  Problem Relation Age of Onset   Dementia Mother    Arthritis Mother    Cancer Brother        prostate   Asthma Sister  Heart disease Paternal Aunt    Heart disease Paternal Uncle    Hypertension Maternal Grandmother    CVA Maternal Grandmother    CVA Paternal Grandmother    Hepatitis C Son    Arthritis Son    Colon cancer Neg Hx    Colon polyps Neg Hx    Kidney disease Neg Hx    Esophageal cancer Neg Hx    Gallbladder disease Neg Hx    Diabetes Neg Hx     Objective: Office vital signs reviewed. BP 133/84    Pulse (!) 55    Temp 97.9 F (36.6 C) (Temporal)    Ht 6' (1.829 m)    Wt 165 lb 6.4 oz (75 kg)    BMI 22.43 kg/m   Physical Examination:  General: Awake, alert, well nourished, No acute distress Cardio: Irregularly irregular  Pulm: Normal work of breathing on room air MSK: Initially antalgic gait but after ambulation this becomes more normal.  He was evaluated on prone position and did have some pain with resisted flexion posteriorly.  There are no palpable deformities.  Assessment/ Plan: 86 y.o. male   1. Chronic atrial fibrillation (HCC) INR now therapeutic at 2.3.  Continue current regimen of warfarin 5 mg daily except for Mondays 2.5 mg.  He will follow-up in 4 to 6 weeks, sooner if needed - CoaguChek XS/INR Waived  2. Chronic anticoagulation - CoaguChek XS/INR Waived  3. Strain of right hamstring, initial encounter Home physical therapy exercises provided today.  He will follow-up as needed on this   No orders of the defined types were placed in this encounter.  No orders of the defined types were placed in this encounter.    [name], DO [place] [phone]

## 2020-09-08 ENCOUNTER — Encounter: Payer: Self-pay | Admitting: Nurse Practitioner

## 2020-09-08 ENCOUNTER — Telehealth: Payer: Self-pay | Admitting: Nurse Practitioner

## 2020-09-08 ENCOUNTER — Ambulatory Visit (INDEPENDENT_AMBULATORY_CARE_PROVIDER_SITE_OTHER): Payer: Medicare Other | Admitting: Nurse Practitioner

## 2020-09-08 VITALS — BP 144/70 | HR 50 | Ht 72.0 in | Wt 165.1 lb

## 2020-09-08 DIAGNOSIS — I4891 Unspecified atrial fibrillation: Secondary | ICD-10-CM | POA: Diagnosis not present

## 2020-09-08 DIAGNOSIS — R1310 Dysphagia, unspecified: Secondary | ICD-10-CM

## 2020-09-08 MED ORDER — FAMOTIDINE 20 MG PO TABS: 20.0000 mg | ORAL_TABLET | Freq: Every day | ORAL | 1 refills | Status: DC

## 2020-09-08 NOTE — Patient Instructions (Signed)
If you are age 85 or older, your body mass index should be between 23-30. Your Body mass index is 22.39 kg/m. If this is out of the aforementioned range listed, please consider follow up with your Primary Care Provider.  If you are age 23 or younger, your body mass index should be between 19-25. Your Body mass index is 22.39 kg/m. If this is out of the aformentioned range listed, please consider follow up with your Primary Care Provider.   We have sent the following medications to your pharmacy for you to pick up at your convenience:  Famotidine 20 mg tablet 1 after dinner.  Try over the counter GAS X 1 tablet twice daily as needed  You have been scheduled for a Barium Esophogram at [facility] (1st floor of the hospital) on 09/17/2020 at 11:00am. Please arrive 15 minutes prior to your appointment for registration. Make certain not to have anything to eat or drink 3 hours prior to your test. If you need to reschedule for any reason, please contact radiology at [phone] to do so. __________________________________________________________________ A barium swallow is an examination that concentrates on views of the esophagus. This tends to be a double contrast exam (barium and two liquids which, when combined, create a gas to distend the wall of the oesophagus) or single contrast (non-ionic iodine based). The study is usually tailored to your symptoms so a good history is essential. Attention is paid during the study to the form, structure and configuration of the esophagus, looking for functional disorders (such as aspiration, dysphagia, achalasia, motility and reflux) EXAMINATION You may be asked to change into a gown, depending on the type of swallow being performed. A radiologist and radiographer will perform the procedure. The radiologist will advise you of the type of contrast selected for your procedure and direct you during the exam. You will be asked to stand, sit or lie in  several different positions and to hold a small amount of fluid in your mouth before being asked to swallow while the imaging is performed .In some instances you may be asked to swallow barium coated marshmallows to assess the motility of a solid food bolus. The exam can be recorded as a digital or video fluoroscopy procedure. POST PROCEDURE It will take 1-2 days for the barium to pass through your system. To facilitate this, it is important, unless otherwise directed, to increase your fluids for the next 24-48hrs and to resume your normal diet.  This test typically takes about 30 minutes to perform. __________________________________________________________________________________ Due to recent changes in healthcare laws, you may see the results of your imaging and laboratory studies on MyChart before your provider has had a chance to review them.  We understand that in some cases there may be results that are confusing or concerning to you. Not all laboratory results come back in the same time frame and the provider may be waiting for multiple results in order to interpret others.  Please give [place] 48 hours in order for your provider to thoroughly review all the results before contacting the office for clarification of your results.   Call the office if your symptoms worsen.  Thank you for choosing [facility] Noralyn Pick, CRNP  [phone]

## 2020-09-08 NOTE — Progress Notes (Signed)
09/08/2020 Dalton Vaughan [id number] 04/06/1929   CHIEF COMPLAINT: gas build up in esophagus   HISTORY OF PRESENT ILLNESS:  Dalton Vaughan is an 86 year old male with a past medical history of depression, hypertension, coronary artery disease s/p 5 vessel CABG 1999, atrial fibrillation on Couomadin, questionable TIAs, hyperlipidemia, hypothyroidism, BPH, diverticulosis and COVID-19 Sept. 2020 which required admission to [facility].  He presents today with complaints of dysphagia which has occurred once every few weeks for the past 2 years.  He is concerned his more recent episodes of dysphagia may be triggering his cardiac arrhythmia.  He describes feeling as if food does not always pass down the esophagus easily.  Food sometimes gets stuck to the upper or lower esophagus, he then drinks cold water and his symptoms worsen.  He feels as if a gas bubble is preventing the food from passing down the esophagus.  The food will eventually pass within a few minutes or he will begin to slowly regurgitate and spit out the stuck food which takes several minutes.  He denies having projectile vomiting.  No specific food triggers.  After the stuck food passes or he spits up he can continue eating his meal without further difficulty.  He is concerned his digestive issues are causing pauses associated with his atrial fibrillation and heart pauses.  His heart rate has been in the 40s to 50s.  He recently underwent a Holter monitor 08/05/2020 which he stated showed intermittent 3 - 4.2 second pauses associated with dizzy spells.  He is scheduled to have a pacemaker placed on 09/21/2020.  He denies having any chest pain or shortness of breath.  He takes Simethicone several times daily.  He is passing a normal brown bowel movement most days.  No rectal bleeding or melena.  He underwent a flexible sigmoidoscopy by Dr. Hilarie Fredrickson 03/24/2015 which showed diverticulosis in the sigmoid colon, internal hemorrhoids and an anal tag.   No obvious weight loss.    CBC Latest Ref Rng & Units 02/12/2020 10/07/2019 09/20/2019  WBC 3.4 - 10.8 x10E3/uL 5.8 7.0 12.5(H)  Hemoglobin 13.0 - 17.7 g/dL 12.9(L) 10.0(L) 10.5(L)  Hematocrit 37.5 - 51.0 % 39.9 32.0(L) 32.8(L)  Platelets 150 - 450 x10E3/uL 182 271 389   Repeat Hg 15.6 on 07/07/2020  CMP Latest Ref Rng & Units 03/30/2020 02/12/2020 09/20/2019  Glucose 65 - 99 mg/dL 83 - 109(H)  BUN 8 - 27 mg/dL 12 - 27(H)  Creatinine 0.76 - 1.27 mg/dL 1.00 - 0.78  Sodium 134 - 144 mmol/L 142 - 139  Potassium 3.5 - 5.2 mmol/L 5.7(H) - 4.8  Chloride 96 - 106 mmol/L 103 - 107  CO2 20 - 29 mmol/L 26 - 27  Calcium 8.6 - 10.2 mg/dL 9.1 - 8.6(L)  Total Protein 6.0 - 8.5 g/dL - 6.2 5.3(L)  Total Bilirubin 0.0 - 1.2 mg/dL - 0.5 0.2(L)  Alkaline Phos 39 - 117 IU/L - 109 93  AST 0 - 40 IU/L - 21 56(H)  ALT 0 - 44 IU/L - 13 57(H)    Flexible sigmoidoscopy 03/24/2015 by Dr. Hilarie Fredrickson: COLON FINDINGS: There was moderate diverticulosis noted in the sigmoid colon. The colonic mucosa appeared normal in the rectum and sigmoid colon. No polyps or masses seen. Retroflexed views revealed large internal hemorrhoids and a skin tag. The scope was then withdrawn from the patient and the procedure terminated. COMPLICATIONS: There were no immediate complications. ENDOSCOPIC IMPRESSION: 1. Moderate diverticulosis was noted in the sigmoid colon 2. The  colonic mucosa appeared normal in the rectum and sigmoid colon  ECHO 11/19/2018:   1. Left ventricular systolic function is preserved.  2. Moderate left atrial enlargement.  3. Mild mitral regurgitation.  4. Mild tricuspid regurgitation.  5. Trace aortic regurgitation.  6. The patient appeared to be in atrial fibrillation throughout  the study.    Past Medical History:  Diagnosis Date  . Anal fissure   . Atrial fibrillation ([place])   . BPH (benign prostatic hypertrophy)   . CAD (coronary artery disease)    Dr. Wynonia Lawman  . Cataract   . COVID-19   .  Depression 2004  . Diverticulosis   . GERD (gastroesophageal reflux disease)   . History of TIAs   . Hyperlipidemia   . Hypertension   . Hypertensive heart disease without CHF   . Hypothyroidism   . Internal hemorrhoids   . Lumbar disc disease   . Oral bleeding 08/20/2012   Following molar tooth extraction July, 2013  . Ruptured disk 1995  . Shingles   . Thyroid disease   . Vitamin D deficiency   . Vitreous detachment    Past Surgical History:  Procedure Laterality Date  . bilateral cataract surgery  6/07  . CARDIAC CATHETERIZATION    . CORONARY ARTERY BYPASS GRAFT  12/99    Dr. Servando Snare   . cysloscopy  8/08  . EYE SURGERY    . HERNIA REPAIR  1997   right  . herninated disc  1995  . PROSTATE BIOPSY  8/08  . TONSILLECTOMY AND ADENOIDECTOMY     Social History: He is widowed.  He has 1 son.  He smoked 1ppd x 18 years. Quit smoking in 1968. He drinks alcohol once yearly on Christmas.   Family History:  Mother died age 45 with history of arthritis. Father died age 65. Half brother with history of prostate cancer and multiple myeloma. Son with history of hepatitis C.      Allergies  Allergen Reactions  . Paxil [Paroxetine Hcl] Palpitations  . Eliquis [Apixaban] Other (See Comments)    dizziness  . Penicillins Other (See Comments)    Did it involve swelling of the face/tongue/throat, SOB, or low BP? Unknown Did it involve sudden or severe rash/hives, skin peeling, or any reaction on the inside of your mouth or nose? Unknown Did you need to seek medical attention at a hospital or doctor's office? Unknown When did it last happen?unk If all above answers are "NO", may proceed with cephalosporin use.   Christy Sartorius Sodium] Other (See Comments)    myalgias  . Zetia [Ezetimibe] Other (See Comments)    myalgias  . Vytorin [Ezetimibe-Simvastatin] Other (See Comments)    myalgias      Outpatient Encounter Medications as of 09/08/2020  Medication Sig  .  amLODipine (NORVASC) 10 MG tablet Take 1 tablet (10 mg total) by mouth daily.  . Calcium Carb-Cholecalciferol (CALCIUM + D3) 600-200 MG-UNIT TABS Take 1 tablet by mouth daily.   . Cholecalciferol (VITAMIN D-3) 5000 UNITS TABS Take 1 tablet by mouth daily.   Marland Kitchen doxazosin (CARDURA) 8 MG tablet Take 1 tablet (8 mg total) by mouth at bedtime.  Marland Kitchen levothyroxine (SYNTHROID) 50 MCG tablet Take 1 tablet (50 mcg total) by mouth daily before breakfast.  . mirtazapine (REMERON) 15 MG tablet Take 1 tablet (15 mg total) by mouth at bedtime.  . Multiple Vitamin (MULTIVITAMIN) tablet Take 1 tablet by mouth daily.    . ramipril (ALTACE) 10 MG  capsule TAKE 1 CAPSULE DAILY  . rosuvastatin (CRESTOR) 5 MG tablet Take 1 tablet (5 mg total) by mouth daily. as directed (Patient taking differently: Take 5 mg by mouth every Monday, Wednesday, and Friday. )  . triamterene-hydrochlorothiazide (MAXZIDE-25) 37.5-25 MG tablet TAKE 1 TABLET DAILY  . vitamin C (ASCORBIC ACID) 500 MG tablet Take 500 mg by mouth daily.   Marland Kitchen warfarin (COUMADIN) 5 MG tablet TAKE AS DIRECTED  . XIIDRA 5 % SOLN Apply 1 drop to eye daily.    No facility-administered encounter medications on file as of 09/08/2020.     REVIEW OF SYSTEMS:  Gen: Denies fever, sweats or chills. No weight loss.  CV: Denies chest pain, palpitations or edema. Resp: Denies cough, shortness of breath of hemoptysis.  GI: Denies heartburn, dysphagia, stomach or lower abdominal pain. No diarrhea or constipation.  GU : Denies urinary burning, blood in urine, increased urinary frequency or incontinence. MS: Denies joint pain, muscles aches or weakness. Derm: Denies rash, itchiness, skin lesions or unhealing ulcers. Psych: Denies depression, anxiety, memory loss, suicidal ideation and confusion. Heme: Denies bruising, bleeding. Neuro:  Denies headaches, dizziness or paresthesias. Endo:  Denies any problems with DM, thyroid or adrenal function.    PHYSICAL EXAM: BP (!)  144/70   Pulse (!) 50   Ht 6' (1.829 m)   Wt 165 lb 2 oz (74.9 kg)   BMI 22.39 kg/m   General: Well developed 86 year old male in no acute distress. Head: Normocephalic and atraumatic. Eyes:  Sclerae non-icteric, conjunctive pink. Ears: Normal auditory acuity. Mouth: Dentition intact. No ulcers or lesions.  Neck: Supple, no lymphadenopathy or thyromegaly.  Lungs: Clear bilaterally to auscultation without wheezes, crackles or rhonchi. Heart: Irregular rhythm.  No murmur, rub or gallop appreciated.  Abdomen: Soft, nontender, non distended. No masses. No hepatosplenomegaly. Normoactive bowel sounds x 4 quadrants. Tattoo to central abdomen.  Rectal: Deferred.  Musculoskeletal: Symmetrical with no gross deformities. Skin: Warm and dry. No rash or lesions on visible extremities. Extremities: No edema. Neurological: Alert oriented x 4, no focal deficits.  Psychological:  Alert and cooperative. Normal mood and affect.  ASSESSMENT AND PLAN:  4.  86 year old male with dysphagia and possible esophageal dysmotility -Barium swallow with tablet -Discussed eventual EGD with cardiac clearance if symptoms persist.  -Patient advised to take 3 separate sips of water before swallowing any medication or food, cut food in small pieces and chew food thoroughly -Gas-X 1 twice daily -Famotidine 20 mg after dinner -Patient to call our office if his symptoms worsen  2.  History of atrial fibrillation on Coumadin.  3.  Coronary artery disease.  Past CABG.  Bradycardia.  Holter monitor 07/2020 demonstrated sustained atrial fibrillation with RVR and pauses, the longest pause was 4.2 seconds.  -Seed with pacemaker placement as recommended by cardiologist Dr. Loni Beckwith at [place]:  [name], DO

## 2020-09-08 NOTE — Telephone Encounter (Signed)
Contacted the patient and provided him with the number for radiology scheduling for him to call and reschedule at his earliest convenience.Patient verbalized understanding and repeated back the number [phone].

## 2020-09-08 NOTE — Telephone Encounter (Signed)
Pt called to advise that he was scheduled for a test on 10/7. However, he was told that he was going to be scheduled on 10/2 because he has a pacemaker placement on 10/6. Pls call to r/s.

## 2020-09-16 NOTE — Progress Notes (Signed)
Addendum: Reviewed and agree with assessment and management plan. Lillien Petronio M, MD  

## 2020-09-20 ENCOUNTER — Other Ambulatory Visit: Payer: Self-pay | Admitting: Family Medicine

## 2020-09-20 DIAGNOSIS — I700 Atherosclerosis of aorta: Secondary | ICD-10-CM

## 2020-09-22 ENCOUNTER — Ambulatory Visit (HOSPITAL_COMMUNITY): Payer: Medicare Other

## 2020-09-30 ENCOUNTER — Other Ambulatory Visit: Payer: Self-pay

## 2020-09-30 ENCOUNTER — Ambulatory Visit (HOSPITAL_COMMUNITY)
Admission: RE | Admit: 2020-09-30 | Discharge: 2020-09-30 | Disposition: A | Discharge status: Home / Self Care | Payer: Medicare Other | Source: Ambulatory Visit | Attending: Nurse Practitioner | Admitting: Nurse Practitioner

## 2020-09-30 DIAGNOSIS — R1310 Dysphagia, unspecified: Secondary | ICD-10-CM | POA: Insufficient documentation

## 2020-10-08 IMAGING — DX DG CHEST 2V
2 series · 2 of 2 positions shown · non-contrast
Comparison: 05/01/2017.

CLINICAL DATA: Flu-like symptoms.

EXAM:
CHEST - 2 VIEW

[chest pa]
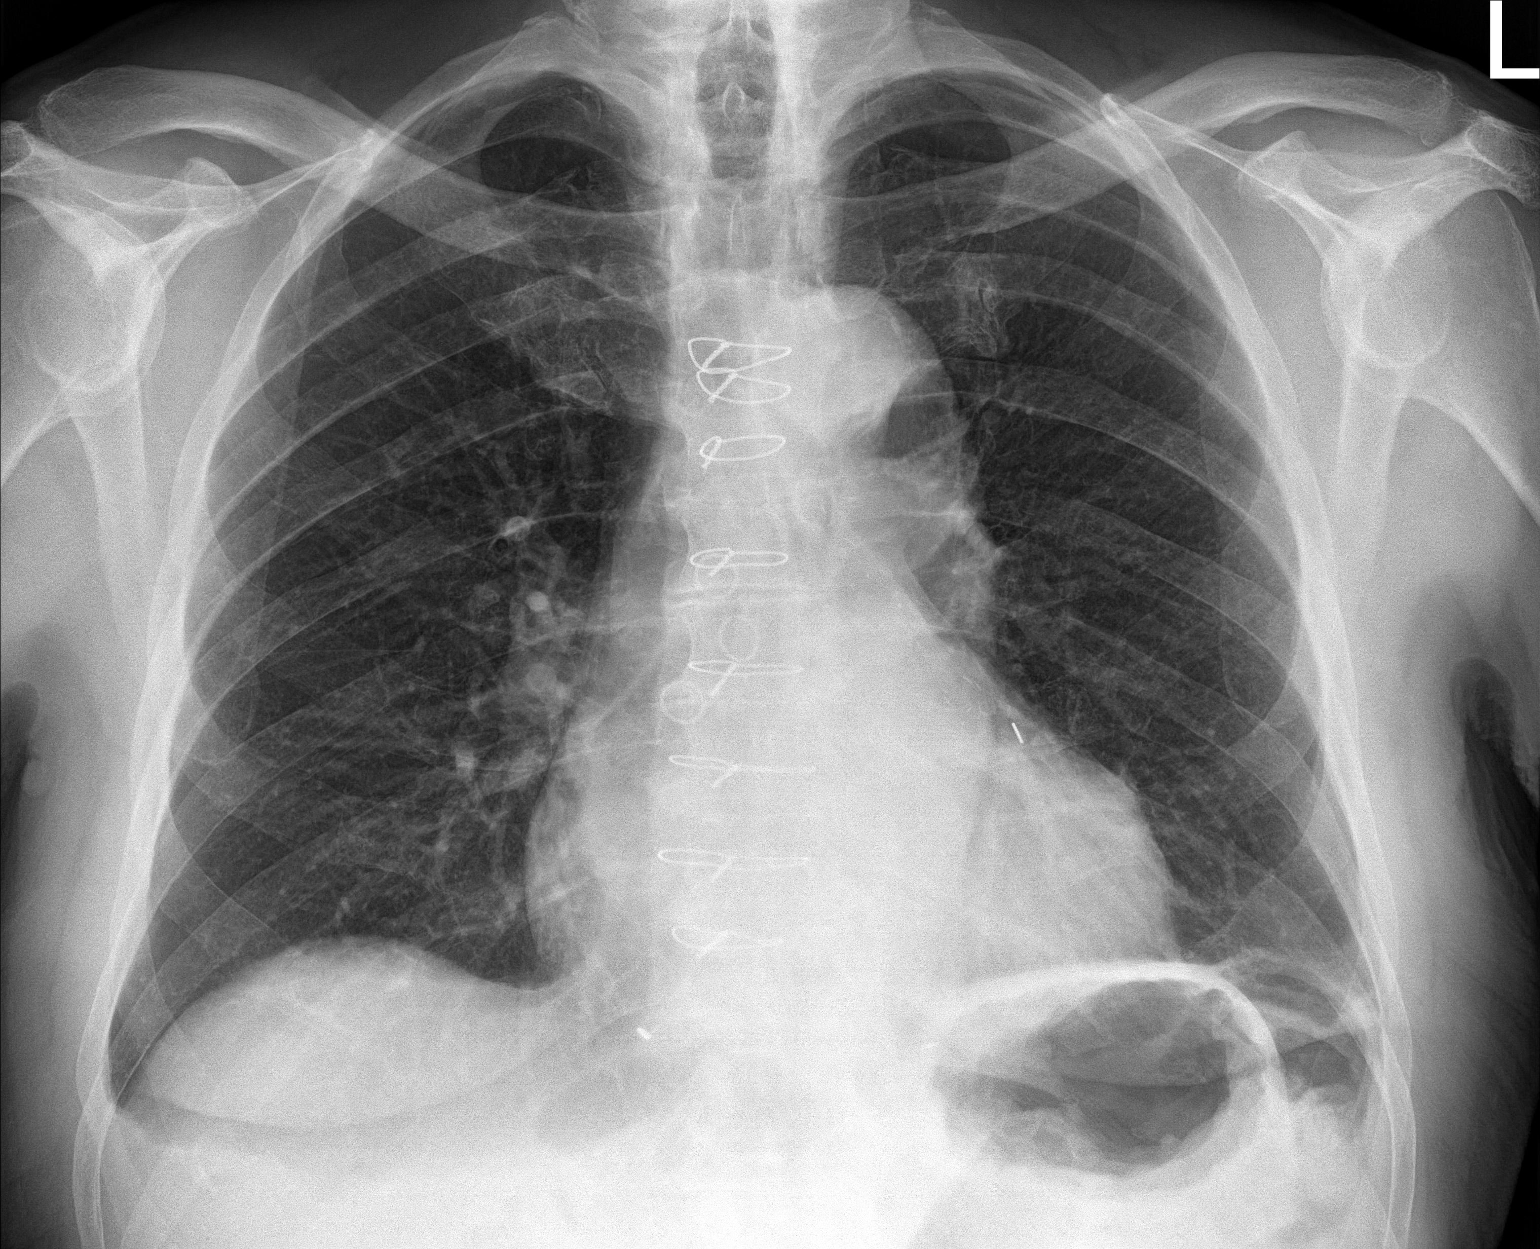

[chest lat]
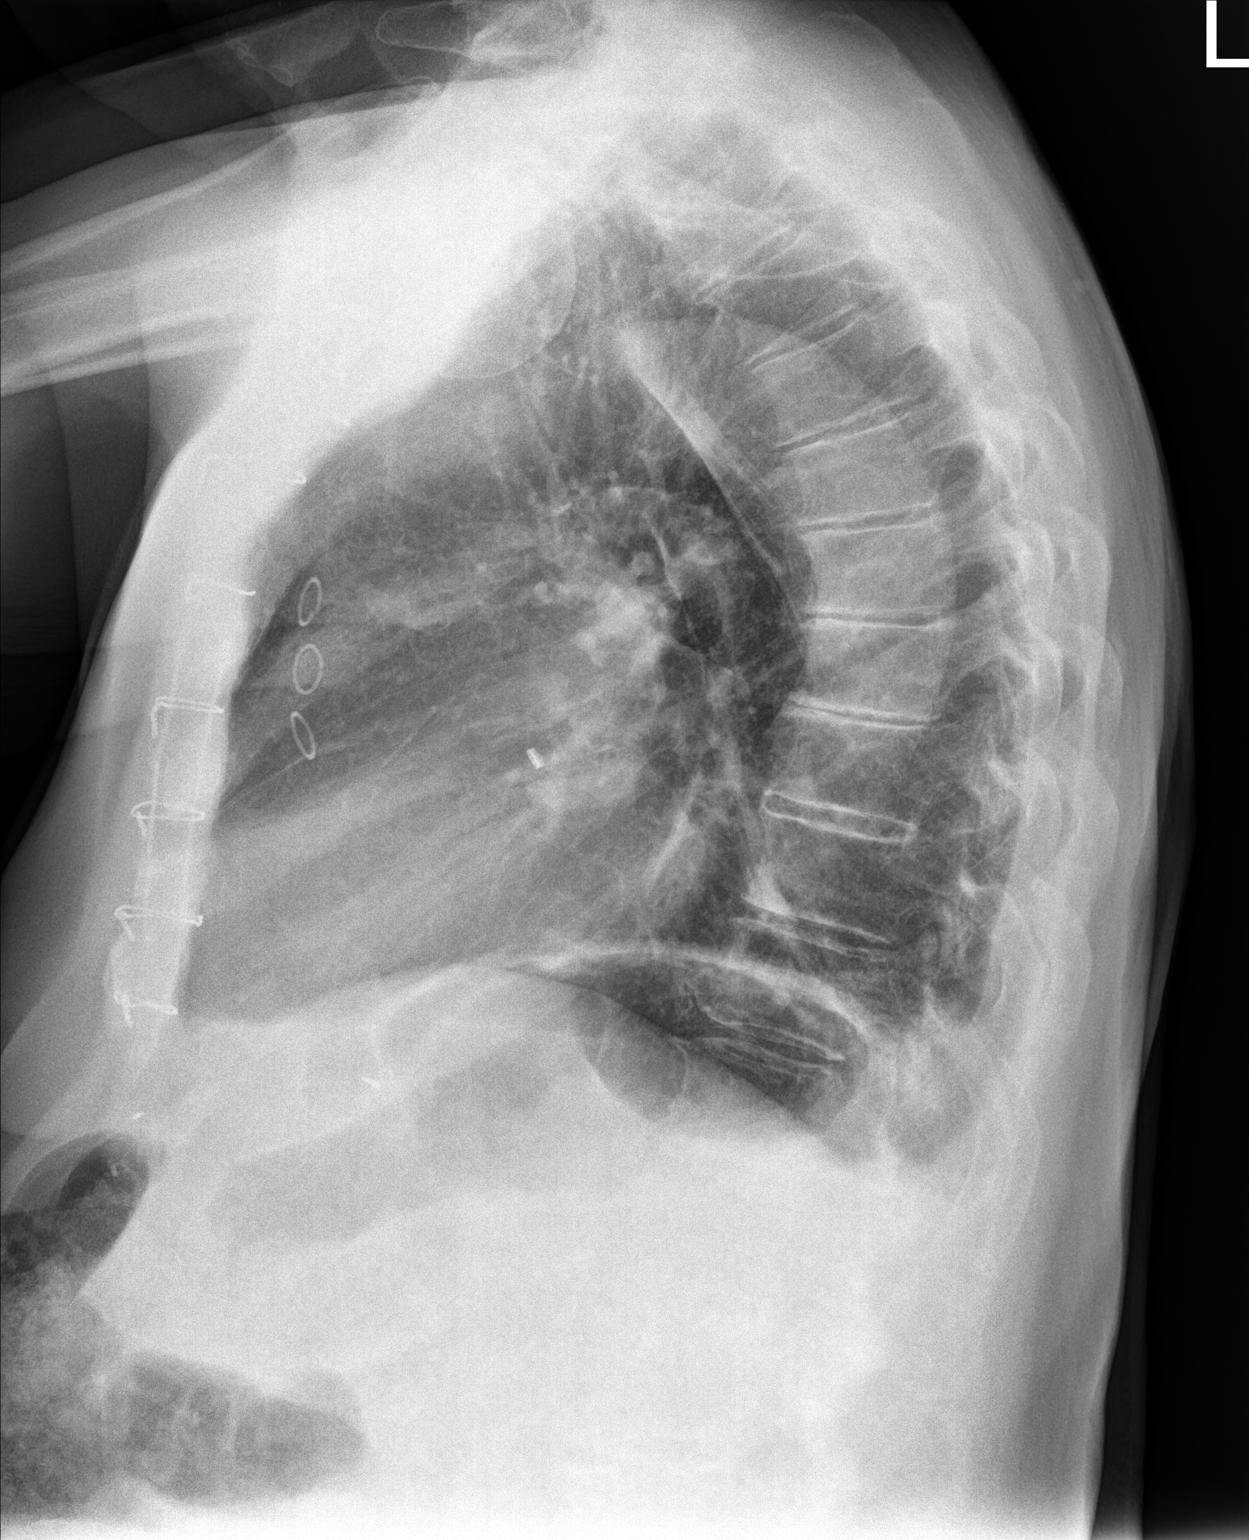

[2 of 2 positions shown; findings below may reference images not displayed]

FINDINGS: Prior CABG. Cardiomegaly with normal pulmonary vascularity. Lungs
are clear of acute infiltrates. Small bilateral pleural effusions.
No pneumothorax.
IMPRESSION: 1.  Prior CABG.  Cardiomegaly.  No pulmonary venous congestion.

2.  No focal infiltrate.  Small bilateral pleural effusions.

## 2020-10-11 ENCOUNTER — Telehealth: Payer: Self-pay | Admitting: Nurse Practitioner

## 2020-10-11 ENCOUNTER — Ambulatory Visit (INDEPENDENT_AMBULATORY_CARE_PROVIDER_SITE_OTHER): Payer: Medicare Other | Admitting: Family Medicine

## 2020-10-11 ENCOUNTER — Encounter: Payer: Self-pay | Admitting: Family Medicine

## 2020-10-11 ENCOUNTER — Other Ambulatory Visit: Payer: Self-pay

## 2020-10-11 VITALS — BP 136/63 | HR 68 | Temp 97.7°F | Ht 72.0 in | Wt 162.6 lb

## 2020-10-11 DIAGNOSIS — Z7901 Long term (current) use of anticoagulants: Secondary | ICD-10-CM

## 2020-10-11 DIAGNOSIS — Z23 Encounter for immunization: Secondary | ICD-10-CM

## 2020-10-11 DIAGNOSIS — I482 Chronic atrial fibrillation, unspecified: Secondary | ICD-10-CM

## 2020-10-11 LAB — COAGUCHEK XS/INR WAIVED
INR: 1.9 — ABNORMAL HIGH (ref 0.9–1.1)
Prothrombin Time: 23.3 s

## 2020-10-11 MED ORDER — TRIAMTERENE-HCTZ 37.5-25 MG PO TABS
1.0000 | ORAL_TABLET | Freq: Every day | ORAL | 3 refills | Status: DC
Start: 2020-10-11 — End: 2021-09-18

## 2020-10-11 NOTE — Telephone Encounter (Signed)
Pt is requesting results from his Esophagram.

## 2020-10-11 NOTE — Telephone Encounter (Signed)
Spoke with the patient. Advised that his providers were discussing his case. He will be contacted this week with recommendations. I did advise him that there were no masses or definite strictures per the radiology note. Patient thanked me for the call.

## 2020-10-11 NOTE — Progress Notes (Signed)
Subjective: CC: f/u afib PCP: [name], DO HPI:Dalton Vaughan is a 86 y.o. male presenting to clinic today for:  1.  Atrial fibrillation  Allergic reaction to Eliquis in past.  Did home INR but a hard stick so comes to office now.  Goal 2-3.  At last visit his INR was therapeutic and he was continued on Coumadin 5 mg daily except for Mondays he would take 2.5 mg.  He has been trying to improve diet some to improve bloating.  He is added peppermint oil and tea to his diet.  No recent changes otherwise.  No hematochezia, melena, epistaxis.  There are talks about having a pacemaker placed with his cardiologist.  He has a follow-up soon to talk more about this as he is somewhat reluctant to have this done now that the bloating has resolved he feels like the pauses in his heart probably have resolved as well.   Allergies  Allergen Reactions  . Paxil [Paroxetine Hcl] Palpitations  . Eliquis [Apixaban] Other (See Comments)    dizziness  . Penicillins Other (See Comments)    Did it involve swelling of the face/tongue/throat, SOB, or low BP? Unknown Did it involve sudden or severe rash/hives, skin peeling, or any reaction on the inside of your mouth or nose? Unknown Did you need to seek medical attention at a hospital or doctor's office? Unknown When did it last happen?unk If all above answers are "NO", may proceed with cephalosporin use.   Christy Sartorius Sodium] Other (See Comments)    myalgias  . Zetia [Ezetimibe] Other (See Comments)    myalgias  . Vytorin [Ezetimibe-Simvastatin] Other (See Comments)    myalgias   Past Medical History:  Diagnosis Date  . Anal fissure   . Arthritis   . Atrial fibrillation ([place])   . BPH (benign prostatic hypertrophy)   . CAD (coronary artery disease)    Dr. Wynonia Lawman  . Cataract   . COVID-19   . Depression 2004  . Diverticulosis   . GERD (gastroesophageal reflux disease)   . History of TIAs   . Hyperlipidemia   .  Hypertension   . Hypertensive heart disease without CHF   . Hypothyroidism   . Internal hemorrhoids   . Lumbar disc disease   . Oral bleeding 08/20/2012   Following molar tooth extraction July, 2013  . Pneumonia   . Ruptured disk 1995  . Shingles   . Thyroid disease   . Vitamin D deficiency   . Vitreous detachment     Current Outpatient Medications:  .  Calcium Carb-Cholecalciferol (CALCIUM + D3) 600-200 MG-UNIT TABS, Take 1 tablet by mouth daily. , Disp: , Rfl:  .  Cholecalciferol (VITAMIN D-3) 5000 UNITS TABS, Take 1 tablet by mouth daily. , Disp: , Rfl:  .  Coenzyme Q10 (CO Q 10 PO), Take 1 tablet by mouth daily., Disp: , Rfl:  .  doxazosin (CARDURA) 8 MG tablet, Take 1 tablet (8 mg total) by mouth at bedtime., Disp: 90 tablet, Rfl: 3 .  famotidine (PEPCID) 20 MG tablet, Take 1 tablet (20 mg total) by mouth daily after supper., Disp: 30 tablet, Rfl: 1 .  levothyroxine (SYNTHROID) 50 MCG tablet, Take 1 tablet (50 mcg total) by mouth daily before breakfast., Disp: 90 tablet, Rfl: 2 .  mirtazapine (REMERON) 15 MG tablet, Take 1 tablet (15 mg total) by mouth at bedtime., Disp: 90 tablet, Rfl: 1 .  Multiple Vitamin (MULTIVITAMIN) tablet, Take 1 tablet by mouth daily.  ,  Disp: , Rfl:  .  ramipril (ALTACE) 10 MG capsule, TAKE 1 CAPSULE DAILY, Disp: 90 capsule, Rfl: 1 .  rosuvastatin (CRESTOR) 5 MG tablet, Take 1 tablet (5 mg total) by mouth daily. as directed (Patient taking differently: Take 5 mg by mouth every Monday, Wednesday, and Friday. ), Disp: 90 tablet, Rfl: 3 .  SIMETHICONE PO, Take by mouth as needed., Disp: , Rfl:  .  triamterene-hydrochlorothiazide (MAXZIDE-25) 37.5-25 MG tablet, TAKE 1 TABLET DAILY, Disp: 90 tablet, Rfl: 0 .  vitamin C (ASCORBIC ACID) 500 MG tablet, Take 500 mg by mouth daily. , Disp: , Rfl:  .  warfarin (COUMADIN) 5 MG tablet, TAKE AS DIRECTED, Disp: 90 tablet, Rfl: 3 .  XIIDRA 5 % SOLN, Apply 1 drop to eye as needed. , Disp: , Rfl:  Social History    Socioeconomic History  . Marital status: Widowed    Spouse name: [name]   . Number of children: 1  . Years of education: 44  . Highest education level: Not on file  Occupational History  . Occupation: Retired    Comment: Social research officer, government x 27 years  . 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 Smoking status: Former Smoker    Packs/day: 1.00    Years: 18.00    Pack years: 18.00    Types: Cigarettes    Start date: 12/17/1948    Quit date: 12/17/1966    Years since quitting: 53.8  . Smokeless tobacco: Never Used  Vaping Use  . Vaping Use: Never used  Substance and Sexual Activity  . Alcohol use: Yes    Alcohol/week: 1.0 standard drink    Types: 1 Standard drinks or equivalent per week    Comment: rare  . Drug use: No  . Sexual activity: Not on file  Other Topics Concern  . Not on file  Social History Narrative   Married.  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 Difficulty of Paying Living Expenses: Not on file  Food Insecurity:   . Worried About Charity fundraiser in the Last Year: Not on file  . Ran Out of Food in the Last Year: Not on file  Transportation Needs:   . Lack of Transportation (Medical): Not on file  . Lack of Transportation (Non-Medical): Not on file  Physical Activity:   . Days of Exercise per Week: Not on file  . Minutes of Exercise per Session: Not on file  Stress:   . Feeling of Stress : Not on file  Social Connections:   . Frequency of Communication with Friends and Family: Not on file  . Frequency of Social Gatherings with Friends and Family: Not on file  . Attends Religious Services: Not on file  . Active Member of Clubs or Organizations: Not on file  . Attends Archivist Meetings: Not on file  . Marital Status: Not on file  Intimate Partner  Violence:   . Fear of Current or Ex-Partner: Not on file  . Emotionally Abused: Not on file  . Physically Abused: Not on file  . Sexually Abused: Not on file   Family History  Problem Relation Age of Onset  . Dementia Mother   . Arthritis Mother   . Cancer  Brother        prostate  . Asthma Sister   . Heart disease Paternal Aunt   . Heart disease Paternal Uncle   . Hypertension Maternal Grandmother   . CVA Maternal Grandmother   . CVA Paternal Grandmother   . Hepatitis C Son   . Arthritis Son   . Colon cancer Neg Hx   . Colon polyps Neg Hx   . Kidney disease Neg Hx   . Esophageal cancer Neg Hx   . Gallbladder disease Neg Hx   . Diabetes Neg Hx     Objective: Office vital signs reviewed. BP 136/63   Pulse 68   Temp 97.7 F (36.5 C)   Ht 6' (1.829 m)   Wt 162 lb 9.6 oz (73.8 kg)   SpO2 97%   BMI 22.05 kg/m   Physical Examination:  General: Awake, alert, well nourished, well appearing elderly male. No acute distress Cardio: Irregularly irregular, S1-S2 heard.  No murmurs. Pulm: Clear to auscultation bilaterally.  No wheezes, rhonchi or rales.  Normal work of breathing on room air MSK: Ambulating independently.  Slow gait  Assessment/ Plan: 86 y.o. male   1. Chronic atrial fibrillation (HCC) Rate controlled.  INR is subtherapeutic at 1.9 today.  Advised to take 5 mg of Coumadin daily.  We can follow-up in 4 weeks, sooner if needed.  Follow-up with cardiology as scheduled.  Potential pacemaker placement. - CoaguChek XS/INR Waived  2. Chronic anticoagulation - CoaguChek XS/INR Waived   Orders Placed This Encounter  Procedures  . CoaguChek XS/INR Waived   Meds ordered this encounter  Medications  . triamterene-hydrochlorothiazide (MAXZIDE-25) 37.5-25 MG tablet    Sig: Take 1 tablet by mouth daily.    Dispense:  90 tablet    Refill:  [place], [place] [phone]

## 2020-11-03 DIAGNOSIS — Z950 Presence of cardiac pacemaker: Secondary | ICD-10-CM | POA: Insufficient documentation

## 2020-11-03 HISTORY — PX: PACEMAKER INSERTION: SHX728

## 2020-11-24 ENCOUNTER — Other Ambulatory Visit: Payer: Self-pay

## 2020-11-24 ENCOUNTER — Ambulatory Visit (INDEPENDENT_AMBULATORY_CARE_PROVIDER_SITE_OTHER): Payer: Medicare Other

## 2020-11-24 ENCOUNTER — Ambulatory Visit (INDEPENDENT_AMBULATORY_CARE_PROVIDER_SITE_OTHER): Payer: Medicare Other | Admitting: Family Medicine

## 2020-11-24 ENCOUNTER — Encounter: Payer: Self-pay | Admitting: Family Medicine

## 2020-11-24 VITALS — BP 143/77 | HR 92 | Temp 97.3°F | Ht 72.0 in | Wt 167.0 lb

## 2020-11-24 DIAGNOSIS — W19XXXA Unspecified fall, initial encounter: Secondary | ICD-10-CM

## 2020-11-24 DIAGNOSIS — M25532 Pain in left wrist: Secondary | ICD-10-CM

## 2020-11-24 DIAGNOSIS — Z7901 Long term (current) use of anticoagulants: Secondary | ICD-10-CM | POA: Diagnosis not present

## 2020-11-24 DIAGNOSIS — Z950 Presence of cardiac pacemaker: Secondary | ICD-10-CM

## 2020-11-24 DIAGNOSIS — I482 Chronic atrial fibrillation, unspecified: Secondary | ICD-10-CM

## 2020-11-24 LAB — COAGUCHEK XS/INR WAIVED
INR: 2 — ABNORMAL HIGH (ref 0.9–1.1)
Prothrombin Time: 24.4 s

## 2020-11-24 NOTE — Progress Notes (Signed)
Subjective: CC: INR check PCP: [name], DO HPI:Dalton Vaughan is a 86 y.o. male presenting to clinic today for:  1.  INR check Patient is here for interval checkup on his Coumadin level.  He has chronic atrial fibrillation status post pacemaker insertion.  He seems to be healing well from a pacemaker insertion standpoint.  He has follow-up with his cardiologist again in January.  So far he seems to be doing relatively well and was told that it is actively pacing him about 70% of the time.  He is glad that he went ahead and proceeded with the insertion as he was somewhat reluctant to do so.  He has been compliant with his Coumadin with the exception of the time he had to be off of it for the pacemaker insertion.  He is taking 1 tablet daily.  He reports some scant epistaxis that occurred in the last couple of days but that has since resolved.  He admits to running his heat and there is not much moisture in the area right now at home  2.  Fall Patient reports he fell on an outstretched hand on the fourth.  He had no immediate swelling or ecchymosis of the wrist.  The left wrist continues to have quite a bit of soft tissue swelling though he does feel that it slightly improving and he is regaining some range of motion.  He has been using ice to the affected area.  Denies any sensory changes to the hand   ROS: Per HPI  Allergies  Allergen Reactions  . Paxil [Paroxetine Hcl] Palpitations  . Eliquis [Apixaban] Other (See Comments)    dizziness  . Penicillins Other (See Comments)    Did it involve swelling of the face/tongue/throat, SOB, or low BP? Unknown Did it involve sudden or severe rash/hives, skin peeling, or any reaction on the inside of your mouth or nose? Unknown Did you need to seek medical attention at a hospital or doctor's office? Unknown When did it last happen?unk If all above answers are "NO", may proceed with cephalosporin use.   Christy Sartorius  Sodium] Other (See Comments)    myalgias  . Zetia [Ezetimibe] Other (See Comments)    myalgias  . Vytorin [Ezetimibe-Simvastatin] Other (See Comments)    myalgias   Past Medical History:  Diagnosis Date  . Anal fissure   . Arthritis   . Atrial fibrillation ([place])   . BPH (benign prostatic hypertrophy)   . CAD (coronary artery disease)    Dr. Wynonia Lawman  . Cataract   . COVID-19   . Depression 2004  . Diverticulosis   . GERD (gastroesophageal reflux disease)   . History of TIAs   . Hyperlipidemia   . Hypertension   . Hypertensive heart disease without CHF   . Hypothyroidism   . Internal hemorrhoids   . Lumbar disc disease   . Oral bleeding 08/20/2012   Following molar tooth extraction July, 2013  . Pneumonia   . Ruptured disk 1995  . Shingles   . Thyroid disease   . Vitamin D deficiency   . Vitreous detachment     Current Outpatient Medications:  .  Calcium Carb-Cholecalciferol (CALCIUM + D3) 600-200 MG-UNIT TABS, Take 1 tablet by mouth daily. , Disp: , Rfl:  .  Cholecalciferol (VITAMIN D-3) 5000 UNITS TABS, Take 1 tablet by mouth daily., Disp: , Rfl:  .  Coenzyme Q10 (CO Q 10 PO), Take 1 tablet by mouth daily., Disp: , Rfl:  .  doxazosin (CARDURA) 8 MG tablet, Take 1 tablet (8 mg total) by mouth at bedtime., Disp: 90 tablet, Rfl: 3 .  famotidine (PEPCID) 20 MG tablet, Take 1 tablet (20 mg total) by mouth daily after supper., Disp: 30 tablet, Rfl: 1 .  levothyroxine (SYNTHROID) 50 MCG tablet, Take 1 tablet (50 mcg total) by mouth daily before breakfast., Disp: 90 tablet, Rfl: 2 .  mirtazapine (REMERON) 15 MG tablet, Take 1 tablet (15 mg total) by mouth at bedtime., Disp: 90 tablet, Rfl: 1 .  Multiple Vitamin (MULTIVITAMIN) tablet, Take 1 tablet by mouth daily., Disp: , Rfl:  .  ramipril (ALTACE) 10 MG capsule, TAKE 1 CAPSULE DAILY, Disp: 90 capsule, Rfl: 1 .  rosuvastatin (CRESTOR) 5 MG tablet, Take 1 tablet (5 mg total) by mouth daily. as directed (Patient taking differently:  Take 5 mg by mouth every Monday, Wednesday, and Friday.), Disp: 90 tablet, Rfl: 3 .  SIMETHICONE PO, Take by mouth as needed., Disp: , Rfl:  .  triamterene-hydrochlorothiazide (MAXZIDE-25) 37.5-25 MG tablet, Take 1 tablet by mouth daily., Disp: 90 tablet, Rfl: 3 .  vitamin C (ASCORBIC ACID) 500 MG tablet, Take 500 mg by mouth daily. , Disp: , Rfl:  .  warfarin (COUMADIN) 5 MG tablet, TAKE AS DIRECTED, Disp: 90 tablet, Rfl: 3 .  XIIDRA 5 % SOLN, Apply 1 drop to eye as needed. , Disp: , Rfl:  Social History   Socioeconomic History  . Marital status: Widowed    Spouse name: [name]   . Number of children: 1  . Years of education: 64  . Highest education level: Not on file  Occupational History  . Occupation: Retired    Comment: Social research officer, government x 27 years  . 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 Smoking status: Former Smoker    Packs/day: 1.00    Years: 18.00    Pack years: 18.00    Types: Cigarettes    Start date: 12/17/1948    Quit date: 12/17/1966    Years since quitting: 53.9  . Smokeless tobacco: Never Used  Vaping Use  . Vaping Use: Never used  Substance and Sexual Activity  . Alcohol use: Yes    Alcohol/week: 1.0 standard drink    Types: 1 Standard drinks or equivalent per week    Comment: rare  . Drug use: No  . Sexual activity: Not on file  Other Topics Concern  . Not on file  Social History Narrative   Married.  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 Dementia Mother   . Arthritis Mother   . Cancer Brother        prostate   . Asthma Sister   . Heart disease Paternal Aunt   . Heart disease Paternal Uncle   . Hypertension Maternal Grandmother   . CVA Maternal Grandmother   . CVA Paternal Grandmother   . Hepatitis C Son   . Arthritis Son   . Colon cancer Neg  Hx   . Colon polyps Neg Hx   . Kidney disease Neg Hx   . Esophageal cancer Neg Hx   . Gallbladder disease Neg Hx   . Diabetes Neg Hx     Objective: Office vital signs reviewed. BP (!) 143/77   Pulse 92   Temp (!) 97.3 F (36.3 C)   Ht 6' (1.829 m)   Wt 167 lb (75.8 kg)   SpO2 98%   BMI 22.65 kg/m   Physical Examination:  General: Awake, alert, well nourished, No acute distress Chest: Pacemaker in place on the left side of the chest.  He still has some postop glue in place.  No evidence of secondary infection or complication Extremities: warm, well perfused, No edema, cyanosis or clubbing; +2 pulses bilaterally MSK:  Left wrist: Moderate soft tissue swelling noted around the entire wrist.  No point tenderness palpation to the wrist or metacarpals.  His active range of motion is limited secondary to pain and swelling.  No appreciable discoloration including ecchymosis.  Assessment/ Plan: 86 y.o. male   Left wrist pain - Plan: DG Wrist Complete Left  Fall, initial encounter - Plan: DG Wrist Complete Left  Chronic atrial fibrillation (HCC)  Chronic anticoagulation  S/P placement of cardiac pacemaker  Personal review of the x-Nicholai did not demonstrate any appreciable fracture.  Awaiting formal review by radiology.  This is likely a sprain.  I recommended icing.  Can take Tylenol if needed for pain but cannot take NSAIDs given anticoagulation  His INR is therapeutic at 2.0 today.  Continue current regimen.  Follow-up in 6 weeks  No apparent complications from placement of cardiac pacemaker.  He seems to be doing relatively well.  Per his report this is an MRI compatible pacemaker  Orders Placed This Encounter  Procedures  . DG Wrist  Complete Left    Standing Status:   Future    Standing Expiration Date:   11/24/2021    Order Specific Question:   Reason for Exam (SYMPTOM  OR DIAGNOSIS REQUIRED)    Answer:   Colette Ribas Saturday on left. ongoing swelling    Order Specific Question:   Preferred imaging location?    Answer:   Internal   No orders of the defined types were placed in this encounter.    [name], DO [place] [phone]

## 2020-11-30 ENCOUNTER — Telehealth: Payer: Self-pay

## 2020-11-30 MED ORDER — DOXAZOSIN MESYLATE 8 MG PO TABS
8.0000 mg | ORAL_TABLET | Freq: Every day | ORAL | 0 refills | Status: DC
Start: 2020-11-30 — End: 2021-08-22

## 2020-11-30 NOTE — Telephone Encounter (Signed)
Sent 30 day supply to [facility]. Patient notified

## 2020-11-30 NOTE — Telephone Encounter (Signed)
Pt called stating that he called [facility] to get his Doxazosin mailed to him but says he needs Dr Danice Goltz to call in a partial Rx to [place] to last him until until his order comes in the mail.  Please advise and call patient.

## 2020-12-15 ENCOUNTER — Ambulatory Visit: Payer: Medicare Other | Admitting: Internal Medicine

## 2020-12-19 DIAGNOSIS — Z950 Presence of cardiac pacemaker: Secondary | ICD-10-CM | POA: Diagnosis not present

## 2020-12-19 DIAGNOSIS — I495 Sick sinus syndrome: Secondary | ICD-10-CM | POA: Diagnosis not present

## 2020-12-19 DIAGNOSIS — R001 Bradycardia, unspecified: Secondary | ICD-10-CM | POA: Diagnosis not present

## 2020-12-19 DIAGNOSIS — Z951 Presence of aortocoronary bypass graft: Secondary | ICD-10-CM | POA: Diagnosis not present

## 2020-12-19 DIAGNOSIS — I2510 Atherosclerotic heart disease of native coronary artery without angina pectoris: Secondary | ICD-10-CM | POA: Diagnosis not present

## 2020-12-19 DIAGNOSIS — I4811 Longstanding persistent atrial fibrillation: Secondary | ICD-10-CM | POA: Diagnosis not present

## 2021-01-03 ENCOUNTER — Other Ambulatory Visit: Payer: Self-pay | Admitting: Family Medicine

## 2021-01-03 DIAGNOSIS — F321 Major depressive disorder, single episode, moderate: Secondary | ICD-10-CM

## 2021-01-06 ENCOUNTER — Other Ambulatory Visit: Payer: Self-pay

## 2021-01-06 ENCOUNTER — Encounter: Payer: Self-pay | Admitting: Family Medicine

## 2021-01-06 ENCOUNTER — Ambulatory Visit (INDEPENDENT_AMBULATORY_CARE_PROVIDER_SITE_OTHER): Payer: Medicare Other | Admitting: Family Medicine

## 2021-01-06 VITALS — BP 129/72 | HR 76 | Temp 97.3°F | Ht 72.0 in | Wt 169.0 lb

## 2021-01-06 DIAGNOSIS — Z7901 Long term (current) use of anticoagulants: Secondary | ICD-10-CM | POA: Diagnosis not present

## 2021-01-06 DIAGNOSIS — M79604 Pain in right leg: Secondary | ICD-10-CM | POA: Diagnosis not present

## 2021-01-06 DIAGNOSIS — I482 Chronic atrial fibrillation, unspecified: Secondary | ICD-10-CM | POA: Diagnosis not present

## 2021-01-06 DIAGNOSIS — L821 Other seborrheic keratosis: Secondary | ICD-10-CM

## 2021-01-06 DIAGNOSIS — Z950 Presence of cardiac pacemaker: Secondary | ICD-10-CM | POA: Diagnosis not present

## 2021-01-06 LAB — COAGUCHEK XS/INR WAIVED
INR: 2.4 — ABNORMAL HIGH (ref 0.9–1.1)
Prothrombin Time: 29.1 s

## 2021-01-06 NOTE — Patient Instructions (Signed)
Fasting labs next visit   Seborrheic Keratosis A seborrheic keratosis is a common, noncancerous (benign) skin growth. These growths are velvety, waxy, rough, tan, brown, or black spots that appear on the skin. These skin growths can be flat or raised, and scaly. What are the causes? The cause of this condition is not known. What increases the risk? You are more likely to develop this condition if you:  Have a family history of seborrheic keratosis.  Are 50 or older.  Are pregnant.  Have had estrogen replacement therapy. What are the signs or symptoms? Symptoms of this condition include growths on the face, chest, shoulders, back, or other areas. These growths:  Are usually painless, but may become irritated and itchy.  Can be yellow, brown, black, or other colors.  Are slightly raised or have a flat surface.  Are sometimes rough or wart-like in texture.  Are often velvety or waxy on the surface.  Are round or oval-shaped.  Often occur in groups, but may occur as a single growth.   How is this diagnosed? This condition is diagnosed with a medical history and physical exam.  A sample of the growth may be tested (skin biopsy).  You may need to see a skin specialist (dermatologist). How is this treated? Treatment is not usually needed for this condition, unless the growths are irritated or bleed often.  You may also choose to have the growths removed if you do not like their appearance. ? Most commonly, these growths are treated with a procedure in which liquid nitrogen is applied to "freeze" off the growth (cryosurgery). ? They may also be burned off with electricity (electrocautery) or removed by scraping (curettage). Follow these instructions at home:  Watch your growth for any changes.  Keep all follow-up visits as told by your health care provider. This is important.  Do not scratch or pick at the growth or growths. This can cause them to become irritated or  infected. Contact a health care provider if:  You suddenly have many new growths.  Your growth bleeds, itches, or hurts.  Your growth suddenly becomes larger or changes color. Summary  A seborrheic keratosis is a common, noncancerous (benign) skin growth.  Treatment is not usually needed for this condition, unless the growths are irritated or bleed often.  Watch your growth for any changes.  Contact a health care provider if you suddenly have many new growths or your growth suddenly becomes larger or changes color.  Keep all follow-up visits as told by your health care provider. This is important. This information is not intended to replace advice given to you by your health care provider. Make sure you discuss any questions you have with your health care provider. Document Revised: 04/17/2018 Document Reviewed: 04/17/2018 Elsevier Patient Education  2021 [employer].

## 2021-01-06 NOTE — Progress Notes (Signed)
Subjective: CC: INR check PCP: [name], DO HPI:Dalton Vaughan is a 87 y.o. male presenting to clinic today for:  1. INR check Patient reports compliance with his Coumadin.  No missed doses.  No changes in medications or diet.  No bleeding episodes.  No heart palpitations.  Pacemaker was increased to 70 bpm last visit with his electrophysiologist.  2. skin lesions Patient reports multiple pigmented skin lesions that he wanted me to have a look at.  They often itch despite adequate hydration.  No spontaneous bleeding.  Lesions particularly are affecting his trunk  3. leg pain Patient with ongoing leg pain.  He points to the posterior insertion of the hamstrings and gastroc as the primary areas of discomfort.  Pain seems to be worse with extension of the leg.  He has had a corticosteroid injection of the right knee with Dr. Almyra Deforest previously but has not found that to be especially helpful.  He would like to get a repeat consultation with him if possible   ROS: Per HPI  Allergies  Allergen Reactions  . Paxil [Paroxetine Hcl] Palpitations  . Eliquis [Apixaban] Other (See Comments)    dizziness  . Penicillins Other (See Comments)    Did it involve swelling of the face/tongue/throat, SOB, or low BP? Unknown Did it involve sudden or severe rash/hives, skin peeling, or any reaction on the inside of your mouth or nose? Unknown Did you need to seek medical attention at a hospital or doctor's office? Unknown When did it last happen?unk If all above answers are "NO", may proceed with cephalosporin use.   Christy Sartorius Sodium] Other (See Comments)    myalgias  . Zetia [Ezetimibe] Other (See Comments)    myalgias  . Vytorin [Ezetimibe-Simvastatin] Other (See Comments)    myalgias   Past Medical History:  Diagnosis Date  . Anal fissure   . Arthritis   . Atrial fibrillation ([place])   . BPH (benign prostatic hypertrophy)   . CAD (coronary artery disease)    Dr.  Wynonia Lawman  . Cataract   . COVID-19   . Depression 2004  . Diverticulosis   . GERD (gastroesophageal reflux disease)   . History of TIAs   . Hyperlipidemia   . Hypertension   . Hypertensive heart disease without CHF   . Hypothyroidism   . Internal hemorrhoids   . Lumbar disc disease   . Oral bleeding 08/20/2012   Following molar tooth extraction July, 2013  . Pneumonia   . Ruptured disk 1995  . Shingles   . Thyroid disease   . Vitamin D deficiency   . Vitreous detachment     Current Outpatient Medications:  .  Calcium Carb-Cholecalciferol (CALCIUM + D3) 600-200 MG-UNIT TABS, Take 1 tablet by mouth daily. , Disp: , Rfl:  .  Cholecalciferol (VITAMIN D-3) 5000 UNITS TABS, Take 1 tablet by mouth daily., Disp: , Rfl:  .  Coenzyme Q10 (CO Q 10 PO), Take 1 tablet by mouth daily., Disp: , Rfl:  .  doxazosin (CARDURA) 8 MG tablet, Take 1 tablet (8 mg total) by mouth at bedtime., Disp: 30 tablet, Rfl: 0 .  famotidine (PEPCID) 20 MG tablet, Take 1 tablet (20 mg total) by mouth daily after supper., Disp: 30 tablet, Rfl: 1 .  levothyroxine (SYNTHROID) 50 MCG tablet, Take 1 tablet (50 mcg total) by mouth daily before breakfast., Disp: 90 tablet, Rfl: 2 .  mirtazapine (REMERON) 15 MG tablet, TAKE 1 TABLET AT BEDTIME, Disp: 90 tablet,  Rfl: 3 .  Multiple Vitamin (MULTIVITAMIN) tablet, Take 1 tablet by mouth daily., Disp: , Rfl:  .  ramipril (ALTACE) 10 MG capsule, TAKE 1 CAPSULE DAILY, Disp: 90 capsule, Rfl: 1 .  rosuvastatin (CRESTOR) 5 MG tablet, Take 1 tablet (5 mg total) by mouth daily. as directed (Patient taking differently: Take 5 mg by mouth every Monday, Wednesday, and Friday.), Disp: 90 tablet, Rfl: 3 .  SIMETHICONE PO, Take by mouth as needed., Disp: , Rfl:  .  triamterene-hydrochlorothiazide (MAXZIDE-25) 37.5-25 MG tablet, Take 1 tablet by mouth daily., Disp: 90 tablet, Rfl: 3 .  vitamin C (ASCORBIC ACID) 500 MG tablet, Take 500 mg by mouth daily. , Disp: , Rfl:  .  warfarin (COUMADIN) 5 MG  tablet, TAKE AS DIRECTED, Disp: 90 tablet, Rfl: 3 .  XIIDRA 5 % SOLN, Apply 1 drop to eye as needed. , Disp: , Rfl:  Social History   Socioeconomic History  . Marital status: Widowed    Spouse name: [name]   . Number of children: 1  . Years of education: 59  . Highest education level: Not on file  Occupational History  . Occupation: Retired    Comment: Social research officer, government x 27 years  . 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 Smoking status: Former Smoker    Packs/day: 1.00    Years: 18.00    Pack years: 18.00    Types: Cigarettes    Start date: 12/17/1948    Quit date: 12/17/1966    Years since quitting: 54.0  . Smokeless tobacco: Never Used  Vaping Use  . Vaping Use: Never used  Substance and Sexual Activity  . Alcohol use: Yes    Alcohol/week: 1.0 standard drink    Types: 1 Standard drinks or equivalent per week    Comment: rare  . Drug use: No  . Sexual activity: Not on file  Other Topics Concern  . Not on file  Social History Narrative   Married.  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 Dementia Mother   . Arthritis Mother   . Cancer Brother        prostate  . Asthma Sister   . Heart disease Paternal Aunt   . Heart disease Paternal Uncle   . Hypertension Maternal Grandmother   . CVA Maternal Grandmother   . CVA Paternal Grandmother   . Hepatitis C Son   . Arthritis Son   . Colon cancer Neg Hx   . Colon polyps Neg Hx   . Kidney disease Neg Hx   . Esophageal cancer Neg Hx   . Gallbladder disease Neg Hx   . Diabetes Neg Hx      Objective: Office vital signs reviewed. BP 129/72   Pulse 76   Temp (!) 97.3 F (36.3 C) (Temporal)   Ht 6' (1.829 m)   Wt 169 lb (76.7 kg)   SpO2 96%   BMI 22.92 kg/m   Physical Examination:  General: Awake, alert, well nourished, No acute distress HEENT: Normal; sclera white.  No exophthalmos or goiter Cardio: regular rate and rhythm, S1S2 heard, no murmurs appreciated Pulm: clear to auscultation bilaterally, no wheezes, rhonchi or rales; normal work of breathing on room air Skin: Multiple pigmented seborrheic keratoses noted along the neck, trunk.  Assessment/ Plan: 87 y.o. male   Chronic atrial fibrillation ([place]) - Plan: CoaguChek XS/INR Waived, CBC, CMP14+EGFR  Chronic anticoagulation  S/P placement of cardiac pacemaker  Right leg pain - Plan: Ambulatory referral to Orthopedic Surgery  Seborrheic keratoses  INR is therapeutic.  Continue current regimen.  Follow-up in 6 weeks  Appears to be in regular rate and rhythm today  Uncertain etiology of leg pain.  Possibly tendinitis.  I placed a referral back to his orthopedist for further evaluation and management as per his request  We discussed that the seborrheic keratoses are benign.  None of them appear to be inflamed or bleeding at this time and therefore there is no indication for removal.  Handout was provided.  Ensure adequate hydration.  Orders Placed This Encounter  Procedures  . CoaguChek XS/INR Waived  . CBC  . CMP14+EGFR   No orders of the defined types were placed in this encounter.    [name], DO [place] [phone]

## 2021-01-07 LAB — CMP14+EGFR
ALT: 13 IU/L (ref 0–44)
AST: 19 IU/L (ref 0–40)
Albumin/Globulin Ratio: 2.3 — ABNORMAL HIGH (ref 1.2–2.2)
Albumin: 4.4 g/dL (ref 3.6–4.6)
Alkaline Phosphatase: 112 IU/L (ref 44–121)
BUN/Creatinine Ratio: 10 (ref 10–24)
BUN: 7 mg/dL — ABNORMAL LOW (ref 8–27)
Bilirubin Total: 0.4 mg/dL (ref 0.0–1.2)
CO2: 28 mmol/L (ref 20–29)
Calcium: 9.3 mg/dL (ref 8.6–10.2)
Chloride: 98 mmol/L (ref 96–106)
Creatinine, Ser: 0.72 mg/dL — ABNORMAL LOW (ref 0.76–1.27)
GFR calc Af Amer: 96 mL/min/{1.73_m2} (ref >59)
GFR calc non Af Amer: 83 mL/min/{1.73_m2} (ref >59)
Globulin, Total: 1.9 g/dL (ref 1.5–4.5)
Glucose: 81 mg/dL (ref 65–99)
Potassium: 3.9 mmol/L (ref 3.5–5.2)
Sodium: 138 mmol/L (ref 134–144)
Total Protein: 6.3 g/dL (ref 6.0–8.5)

## 2021-01-07 LAB — CBC
Hematocrit: 40.1 % (ref 37.5–51.0)
Hemoglobin: 13.2 g/dL (ref 13.0–17.7)
MCH: 30.2 pg (ref 26.6–33.0)
MCHC: 32.9 g/dL (ref 31.5–35.7)
MCV: 92 fL (ref 79–97)
Platelets: 148 10*3/uL — ABNORMAL LOW (ref 150–450)
RBC: 4.37 x10E6/uL (ref 4.14–5.80)
RDW: 13.7 % (ref 11.6–15.4)
WBC: 5.8 10*3/uL (ref 3.4–10.8)

## 2021-01-26 DIAGNOSIS — I70203 Unspecified atherosclerosis of native arteries of extremities, bilateral legs: Secondary | ICD-10-CM | POA: Diagnosis not present

## 2021-01-26 DIAGNOSIS — L84 Corns and callosities: Secondary | ICD-10-CM | POA: Diagnosis not present

## 2021-01-26 DIAGNOSIS — M170 Bilateral primary osteoarthritis of knee: Secondary | ICD-10-CM | POA: Diagnosis not present

## 2021-01-26 DIAGNOSIS — B351 Tinea unguium: Secondary | ICD-10-CM | POA: Diagnosis not present

## 2021-01-26 DIAGNOSIS — M79676 Pain in unspecified toe(s): Secondary | ICD-10-CM | POA: Diagnosis not present

## 2021-02-01 ENCOUNTER — Ambulatory Visit: Payer: Medicare Other | Attending: Orthopedic Surgery | Admitting: Physical Therapy

## 2021-02-01 ENCOUNTER — Other Ambulatory Visit: Payer: Self-pay

## 2021-02-01 DIAGNOSIS — M6281 Muscle weakness (generalized): Secondary | ICD-10-CM | POA: Insufficient documentation

## 2021-02-01 DIAGNOSIS — M25561 Pain in right knee: Secondary | ICD-10-CM | POA: Insufficient documentation

## 2021-02-01 DIAGNOSIS — G8929 Other chronic pain: Secondary | ICD-10-CM | POA: Diagnosis not present

## 2021-02-01 NOTE — Therapy (Signed)
[facility] [place], [place], [address] Phone: [phone]   Fax:  [phone]  Physical Therapy Evaluation  Patient Details  Name: Dalton Vaughan MRN: [id number] Date of Birth: 10-24-1929 Referring Provider (PT): Fenton Foy PA-C.   Encounter Date: 02/01/2021   PT End of Session - 02/01/21 1228    Visit Number 1    Number of Visits 12    Date for PT Re-Evaluation 03/01/21    Authorization Type FOTO AT LEAST EVERY 5TH VISIT.  PROGRESS NOTE AT 10TH VISIT.  KX MODIFIER AFTER 15 VISITS.    PT Start Time 0815    PT Stop Time 0900    PT Time Calculation (min) 45 min    Activity Tolerance Patient tolerated treatment well    Behavior During Therapy WFL for tasks assessed/performed           Past Medical History:  Diagnosis Date  . Anal fissure   . Arthritis   . Atrial fibrillation ([place])   . BPH (benign prostatic hypertrophy)   . CAD (coronary artery disease)    Dr. Wynonia Lawman  . Cataract   . COVID-19   . Depression 2004  . Diverticulosis   . GERD (gastroesophageal reflux disease)   . History of TIAs   . Hyperlipidemia   . Hypertension   . Hypertensive heart disease without CHF   . Hypothyroidism   . Internal hemorrhoids   . Lumbar disc disease   . Oral bleeding 08/20/2012   Following molar tooth extraction July, 2013  . Pneumonia   . Ruptured disk 1995  . Shingles   . Thyroid disease   . Vitamin D deficiency   . Vitreous detachment     Past Surgical History:  Procedure Laterality Date  . bilateral cataract surgery  6/07  . CARDIAC CATHETERIZATION    . CORONARY ARTERY BYPASS GRAFT  12/99    Dr. Servando Snare   . cysloscopy  8/08  . EYE SURGERY    . HERNIA REPAIR  1997   right  . herninated disc  1995  . PACEMAKER INSERTION  11/03/2020  . PROSTATE BIOPSY  8/08  . TONSILLECTOMY AND ADENOIDECTOMY      There were no vitals filed for this visit.    Subjective Assessment - 02/01/21 0845    Subjective  COVID-19 screen performed prior to patient entering clinic.  The patient presents to the clinic today with c/o right knee pain.  He reports that his knee began to swell in August of last year. He underwent arthroscopic surgery in September of 2020.  He reports a pain-level of 5/10 today and an inability to full straighten or bend his knee.  He has tried a stationary bike and treadmill at home but this seems to aggravate his knee.  He has not found anything to make his knee feel better as of yet.  He has had to have his knee aspirated in the past.    Pertinent History PACEMAKER, Atrial fib, lumbar disc disease, HTN, CHF, h/o neck pain.    How long can you walk comfortably? Around home and short community distances.    Patient Stated Goals Regain range of motion in knee and do more.    Currently in Pain? Yes    Pain Score 5     Pain Location Knee    Pain Orientation Right    Pain Descriptors / Indicators Sore;Sharp    Pain Type Surgical pain    Pain Onset More than a  month ago    Pain Frequency Constant    Aggravating Factors  See above.    Pain Relieving Factors See above.              [facility] PT Assessment - 02/01/21 0001      Assessment   Medical Diagnosis Right knee OA.    Referring Provider (PT) Fenton Foy PA-C.    Onset Date/Surgical Date --   07/2021     Precautions   Precaution Comments PACEMAKER.      Restrictions   Weight Bearing Restrictions No      Balance Screen   Has the patient fallen in the past 6 months No    Has the patient had a decrease in activity level because of a fear of falling?  No    Is the patient reluctant to leave their home because of a fear of falling?  No      Home Environment   Living Environment Private residence      Prior Function   Level of Independence Independent      Observation/Other Assessments   Focus on Therapeutic Outcomes (FOTO)  Complete.      Observation/Other Assessments-Edema    Edema --   Minimal right knee edema.      ROM / Strength   AROM / PROM / Strength AROM;Strength      AROM   Overall AROM Comments -20 degrees to 100 degress.      Strength   Overall Strength Comments Right knee strength is 4 to 4+/5 with a decrease in volitional contraction of his right quadriceps in supine.      Palpation   Palpation comment Pain "in" right knee.  C/o hamstring pain right knee flexion.      Ambulation/Gait   Gait Comments The patient ambulates right knee held in flexion and some genu valgum.                      Objective measurements completed on examination: See above findings.       [facility] Adult PT Treatment/Exercise - 02/01/21 0001      Modalities   Modalities Vasopneumatic      Vasopneumatic   Number Minutes Vasopneumatic  15 minutes    Vasopnuematic Location  --   Right knee.   Vasopneumatic Pressure Low                       PT Long Term Goals - 02/01/21 1256      PT LONG TERM GOAL #1   Title Independent with a HEP.    Time 4    Period Weeks    Status New      PT LONG TERM GOAL #2   Title Active right knee flexion to 115-120 degrees+ so the patient can perform functional tasks and do so with pain not > 2-3/10.    Time 4    Period Weeks    Status New      PT LONG TERM GOAL #3   Title Full active knee extension in order to normalize gait.    Time 4    Period Weeks    Status New      PT LONG TERM GOAL #4   Title Perform ADL's with pain not > 2-3/10.    Time 4    Period Weeks    Status New  Plan - 02/01/21 1245    Clinical Impression Statement The patient presents to OPPT with c/o of right knee pain.  He underwent arthroscopic surgery on 08/28/19.  He cannot fully extend his knee and has limited flexion as well.  He lacks some volitional contraction of his right quadriceps while assessed in supine.  He c/o hamstring pain especially with increase activity and knee flexion.  His fucntional mobility is impaired.  Patient will  benefit from skilled physical therapy intervention to address pain and deficits.    Personal Factors and Comorbidities Comorbidity 1;Comorbidity 2    Comorbidities PACEMAKER, Atrial fib, lumbar disc disease, HTN, CHF, h/o neck pain.    Examination-Activity Limitations Locomotion Level;Other    Examination-Participation Restrictions Other    Stability/Clinical Decision Making Evolving/Moderate complexity    Clinical Decision Making Low    Rehab Potential Good    PT Frequency 3x / week    PT Duration 4 weeks    PT Treatment/Interventions ADLs/Self Care Home Management;Cryotherapy;Moist Heat;Gait training;Stair training;Functional mobility training;Therapeutic activities;Therapeutic exercise;Neuromuscular re-education;Manual techniques;Patient/family education;Passive range of motion;Dry needling;Vasopneumatic Device    PT Next Visit Plan Right hamstring stretching and STW/M, Nustep, quadriceps strengthning.    Consulted and Agree with Plan of Care Patient           Patient will benefit from skilled therapeutic intervention in order to improve the following deficits and impairments:  Abnormal gait,Difficulty walking,Decreased activity tolerance,Decreased strength,Decreased range of motion  Visit Diagnosis: Chronic pain of right knee - Plan: PT plan of care cert/re-cert  Muscle weakness (generalized) - Plan: PT plan of care cert/re-cert     Problem List Patient Active Problem List   Diagnosis Date Noted  . Acute respiratory failure with hypoxia ([place]) 09/16/2019  . Osteoarthritis of spine with radiculopathy, lumbar region 01/08/2018  . Thyroid disease   . Aortic atherosclerosis ([place]) 05/02/2017  . Thrombocytopenia ([place]) 07/10/2016  . Hereditary and idiopathic peripheral neuropathy 10/16/2015  . Mononeuropathy 10/04/2015  . Allergic rhinitis 05/06/2014  . Depression 08/03/2013  . Sick sinus syndrome ([place]) 07/31/2012  . Hypertensive heart disease without CHF   . Chronic atrial  fibrillation ([place])   . Coronary artery disease involving native coronary artery of native heart with angina pectoris ([place])   . History of TIAs   . Lumbar disc disease   . GERD (gastroesophageal reflux disease)   . Chronic anticoagulation   . Benign prostatic hyperplasia   . Hyperlipidemia     Garrell Flagg, [place] MPT 02/01/2021, 1:00 PM  [facility] [place], [place], [address] Phone: [phone]   Fax:  [phone]  Name: Dalton Vaughan MRN: [id number] Date of Birth: [birth year]

## 2021-02-02 ENCOUNTER — Encounter: Payer: Self-pay | Admitting: Physical Therapy

## 2021-02-02 ENCOUNTER — Ambulatory Visit: Payer: Medicare Other | Admitting: Physical Therapy

## 2021-02-02 ENCOUNTER — Other Ambulatory Visit: Payer: Self-pay

## 2021-02-02 DIAGNOSIS — G8929 Other chronic pain: Secondary | ICD-10-CM | POA: Diagnosis not present

## 2021-02-02 DIAGNOSIS — M25561 Pain in right knee: Secondary | ICD-10-CM | POA: Diagnosis not present

## 2021-02-02 DIAGNOSIS — M6281 Muscle weakness (generalized): Secondary | ICD-10-CM | POA: Diagnosis not present

## 2021-02-02 NOTE — Therapy (Signed)
[facility] [place], [place], [address] Phone: [phone]   Fax:  [phone]  Physical Therapy Treatment  Patient Details  Name: Dalton Vaughan MRN: [id number] Date of Birth: [birth year] Referring Provider (PT): Fenton Foy PA-C.   Encounter Date: 02/02/2021   PT End of Session - 02/02/21 1043    Visit Number 2    Number of Visits 12    Date for PT Re-Evaluation 03/01/21    Authorization Type FOTO AT LEAST EVERY 5TH VISIT.  PROGRESS NOTE AT 10TH VISIT.  KX MODIFIER AFTER 15 VISITS.    PT Start Time 1030    PT Stop Time 1117    PT Time Calculation (min) 47 min    Activity Tolerance Patient tolerated treatment well    Behavior During Therapy WFL for tasks assessed/performed           Past Medical History:  Diagnosis Date  . Anal fissure   . Arthritis   . Atrial fibrillation ([place])   . BPH (benign prostatic hypertrophy)   . CAD (coronary artery disease)    Dr. Wynonia Lawman  . Cataract   . COVID-19   . Depression 2004  . Diverticulosis   . GERD (gastroesophageal reflux disease)   . History of TIAs   . Hyperlipidemia   . Hypertension   . Hypertensive heart disease without CHF   . Hypothyroidism   . Internal hemorrhoids   . Lumbar disc disease   . Oral bleeding 08/20/2012   Following molar tooth extraction July, 2013  . Pneumonia   . Ruptured disk 1995  . Shingles   . Thyroid disease   . Vitamin D deficiency   . Vitreous detachment     Past Surgical History:  Procedure Laterality Date  . bilateral cataract surgery  6/07  . CARDIAC CATHETERIZATION    . CORONARY ARTERY BYPASS GRAFT  12/99    Dr. Servando Snare   . cysloscopy  8/08  . EYE SURGERY    . HERNIA REPAIR  1997   right  . herninated disc  1995  . PACEMAKER INSERTION  11/03/2020  . PROSTATE BIOPSY  8/08  . TONSILLECTOMY AND ADENOIDECTOMY      There were no vitals filed for this visit.   Subjective Assessment - 02/02/21 1042    Subjective COVID 19  screening performed on patient upon arrival. Reports pain is in R knee as well as HS.    Pertinent History PACEMAKER, Atrial fib, lumbar disc disease, HTN, CHF, h/o neck pain.    How long can you walk comfortably? Around home and short community distances.    Patient Stated Goals Regain range of motion in knee and do more.    Currently in Pain? Yes    Pain Score 5     Pain Location Leg    Pain Orientation Right;Posterior;Proximal    Pain Descriptors / Indicators Discomfort    Pain Type Surgical pain    Pain Onset More than a month ago    Pain Frequency Constant              [facility] PT Assessment - 02/02/21 0001      Assessment   Medical Diagnosis Right knee OA.    Referring Provider (PT) Fenton Foy PA-C.    Hand Dominance Right      Precautions   Precaution Comments PACEMAKER.      Restrictions   Weight Bearing Restrictions No  [facility] Adult PT Treatment/Exercise - 02/02/21 0001      Exercises   Exercises Knee/Hip      Knee/Hip Exercises: Stretches   Passive Hamstring Stretch Right;3 reps;30 seconds      Knee/Hip Exercises: Aerobic   Nustep L4 x15 min      Knee/Hip Exercises: Machines for Strengthening   Cybex Knee Extension 10# 2x10 reps      Knee/Hip Exercises: Standing   Forward Lunges Right;10 reps;5 seconds    Forward Lunges Limitations off 8" step    Rocker Board 3 minutes      Modalities   Modalities Vasopneumatic      Vasopneumatic   Number Minutes Vasopneumatic  15 minutes    Vasopnuematic Location  Knee    Vasopneumatic Pressure Low    Vasopneumatic Temperature  34/pain                  PT Education - 02/02/21 1122    Education Details HEP- ITB, HS stretch    Person(s) Educated Patient    Methods Explanation;Demonstration;Handout    Comprehension Verbalized understanding               PT Long Term Goals - 02/01/21 1256      PT LONG TERM GOAL #1   Title Independent with a HEP.    Time  4    Period Weeks    Status New      PT LONG TERM GOAL #2   Title Active right knee flexion to 115-120 degrees+ so the patient can perform functional tasks and do so with pain not > 2-3/10.    Time 4    Period Weeks    Status New      PT LONG TERM GOAL #3   Title Full active knee extension in order to normalize gait.    Time 4    Period Weeks    Status New      PT LONG TERM GOAL #4   Title Perform ADL's with pain not > 2-3/10.    Time 4    Period Weeks    Status New                 Plan - 02/02/21 1159    Clinical Impression Statement Patient presented in clinic with reports of chronic R knee pain and HS tightness. Patient limited with full knee extension due to HS tightness and knee flexion noted with standing stretch at step. Increased tone notable in R ITB as well and facial grimacing noted with stretching. Patient provided HEP for HS and ITB stretching at home with education regarding parameters and technique. Patient verbalized understanding of HEP instructions. Normal vasopneumatic response noted following removal of the modality.    Personal Factors and Comorbidities Comorbidity 1;Comorbidity 2    Comorbidities PACEMAKER, Atrial fib, lumbar disc disease, HTN, CHF, h/o neck pain.    Examination-Activity Limitations Locomotion Level;Other    Examination-Participation Restrictions Other    Stability/Clinical Decision Making Evolving/Moderate complexity    Rehab Potential Good    PT Frequency 3x / week    PT Duration 4 weeks    PT Treatment/Interventions ADLs/Self Care Home Management;Cryotherapy;Moist Heat;Gait training;Stair training;Functional mobility training;Therapeutic activities;Therapeutic exercise;Neuromuscular re-education;Manual techniques;Patient/family education;Passive range of motion;Dry needling;Vasopneumatic Device    PT Next Visit Plan Right hamstring stretching and STW/M, Nustep, quadriceps strengthning.    Consulted and Agree with Plan of Care  Patient           Patient will benefit from  skilled therapeutic intervention in order to improve the following deficits and impairments:  Abnormal gait,Difficulty walking,Decreased activity tolerance,Decreased strength,Decreased range of motion  Visit Diagnosis: Chronic pain of right knee  Muscle weakness (generalized)     Problem List Patient Active Problem List   Diagnosis Date Noted  . Acute respiratory failure with hypoxia ([place]) 09/16/2019  . Osteoarthritis of spine with radiculopathy, lumbar region 01/08/2018  . Thyroid disease   . Aortic atherosclerosis ([place]) 05/02/2017  . Thrombocytopenia ([place]) 07/10/2016  . Hereditary and idiopathic peripheral neuropathy 10/16/2015  . Mononeuropathy 10/04/2015  . Allergic rhinitis 05/06/2014  . Depression 08/03/2013  . Sick sinus syndrome ([place]) 07/31/2012  . Hypertensive heart disease without CHF   . Chronic atrial fibrillation ([place])   . Coronary artery disease involving native coronary artery of native heart with angina pectoris ([place])   . History of TIAs   . Lumbar disc disease   . GERD (gastroesophageal reflux disease)   . Chronic anticoagulation   . Benign prostatic hyperplasia   . Hyperlipidemia     Standley Brooking, PTA 02/02/2021, 12:04 PM  [facility] [place], [place], [address] Phone: [phone]   Fax:  [phone]  Name: Dalton Vaughan MRN: [id number] Date of Birth: 12-05-1929

## 2021-02-06 ENCOUNTER — Ambulatory Visit: Payer: Medicare Other | Admitting: Physical Therapy

## 2021-02-06 ENCOUNTER — Other Ambulatory Visit: Payer: Self-pay

## 2021-02-06 ENCOUNTER — Encounter: Payer: Self-pay | Admitting: Physical Therapy

## 2021-02-06 DIAGNOSIS — G8929 Other chronic pain: Secondary | ICD-10-CM

## 2021-02-06 DIAGNOSIS — M25561 Pain in right knee: Secondary | ICD-10-CM | POA: Diagnosis not present

## 2021-02-06 DIAGNOSIS — R310 Gross hematuria: Secondary | ICD-10-CM | POA: Diagnosis not present

## 2021-02-06 DIAGNOSIS — M6281 Muscle weakness (generalized): Secondary | ICD-10-CM | POA: Diagnosis not present

## 2021-02-06 NOTE — Therapy (Signed)
[facility] [place], [place], [address] Phone: [phone]   Fax:  [phone]  Physical Therapy Treatment  Patient Details  Name: Dalton Vaughan MRN: [id number] Date of Birth: 01-20-1929 Referring Provider (PT): Fenton Foy PA-C.   Encounter Date: 02/06/2021   PT End of Session - 02/06/21 0907    Visit Number 3    Number of Visits 12    Date for PT Re-Evaluation 03/01/21    Authorization Type FOTO AT LEAST EVERY 5TH VISIT.  PROGRESS NOTE AT 10TH VISIT.  KX MODIFIER AFTER 15 VISITS.    PT Start Time 0901    PT Stop Time 0947    PT Time Calculation (min) 46 min    Activity Tolerance Patient tolerated treatment well    Behavior During Therapy [facility] for tasks assessed/performed           Past Medical History:  Diagnosis Date  . Anal fissure   . Arthritis   . Atrial fibrillation ([place])   . BPH (benign prostatic hypertrophy)   . CAD (coronary artery disease)    Dr. Wynonia Lawman  . Cataract   . COVID-19   . Depression 2004  . Diverticulosis   . GERD (gastroesophageal reflux disease)   . History of TIAs   . Hyperlipidemia   . Hypertension   . Hypertensive heart disease without CHF   . Hypothyroidism   . Internal hemorrhoids   . Lumbar disc disease   . Oral bleeding 08/20/2012   Following molar tooth extraction July, 2013  . Pneumonia   . Ruptured disk 1995  . Shingles   . Thyroid disease   . Vitamin D deficiency   . Vitreous detachment     Past Surgical History:  Procedure Laterality Date  . bilateral cataract surgery  6/07  . CARDIAC CATHETERIZATION    . CORONARY ARTERY BYPASS GRAFT  12/99    Dr. Servando Snare   . cysloscopy  8/08  . EYE SURGERY    . HERNIA REPAIR  1997   right  . herninated disc  1995  . PACEMAKER INSERTION  11/03/2020  . PROSTATE BIOPSY  8/08  . TONSILLECTOMY AND ADENOIDECTOMY      There were no vitals filed for this visit.   Subjective Assessment - 02/06/21 0905    Subjective COVID 19  screening performed on patient upon arrival. Reports stiffness from R calf to HS. But overall feeling better.    Pertinent History PACEMAKER, Atrial fib, lumbar disc disease, HTN, CHF, h/o neck pain.    How long can you walk comfortably? Around home and short community distances.    Patient Stated Goals Regain range of motion in knee and do more.    Currently in Pain? Yes    Pain Score --   No pain score provided   Pain Location Leg    Pain Orientation Right;Posterior    Pain Descriptors / Indicators Discomfort    Pain Type Surgical pain    Pain Onset More than a month ago    Pain Frequency Constant              [facility] PT Assessment - 02/06/21 0001      Assessment   Medical Diagnosis Right knee OA.    Referring Provider (PT) Fenton Foy PA-C.    Hand Dominance Right      Precautions   Precaution Comments PACEMAKER.      Restrictions   Weight Bearing Restrictions No  [facility] Adult PT Treatment/Exercise - 02/06/21 0001      Knee/Hip Exercises: Stretches   Passive Hamstring Stretch Right;3 reps;30 seconds      Knee/Hip Exercises: Aerobic   Nustep L5 x16 min      Knee/Hip Exercises: Machines for Strengthening   Cybex Knee Extension 10# 2x10 reps    Cybex Knee Flexion 30# 2x10 reps      Knee/Hip Exercises: Standing   Rocker Board 3 minutes      Modalities   Modalities Vasopneumatic      Vasopneumatic   Number Minutes Vasopneumatic  10 minutes    Vasopnuematic Location  Knee    Vasopneumatic Pressure Low    Vasopneumatic Temperature  34/pain      Manual Therapy   Manual Therapy Myofascial release    Myofascial Release IASTW to R ITB, distal HS and superior calf to reduce tone                       PT Long Term Goals - 02/01/21 1256      PT LONG TERM GOAL #1   Title Independent with a HEP.    Time 4    Period Weeks    Status New      PT LONG TERM GOAL #2   Title Active right knee flexion to 115-120  degrees+ so the patient can perform functional tasks and do so with pain not > 2-3/10.    Time 4    Period Weeks    Status New      PT LONG TERM GOAL #3   Title Full active knee extension in order to normalize gait.    Time 4    Period Weeks    Status New      PT LONG TERM GOAL #4   Title Perform ADL's with pain not > 2-3/10.    Time 4    Period Weeks    Status New                 Plan - 02/06/21 1014    Clinical Impression Statement Patient presented with continued soreness of RLE from mid calf to HS per patient report. Patient guided through light strengthening and stretching session today. Less strain notable with standing HS stretch today. IASTW completed to R ITB, HS and superior calf with moderate redness response to R ITB while in extension stretch. Normal vasopneumatic response noted following removal of the modality. Patient reported "some" tension reduction following end of session.    Personal Factors and Comorbidities Comorbidity 1;Comorbidity 2    Comorbidities PACEMAKER, Atrial fib, lumbar disc disease, HTN, CHF, h/o neck pain.    Examination-Activity Limitations Locomotion Level;Other    Examination-Participation Restrictions Other    Stability/Clinical Decision Making Evolving/Moderate complexity    Rehab Potential Good    PT Frequency 3x / week    PT Duration 4 weeks    PT Treatment/Interventions ADLs/Self Care Home Management;Cryotherapy;Moist Heat;Gait training;Stair training;Functional mobility training;Therapeutic activities;Therapeutic exercise;Neuromuscular re-education;Manual techniques;Patient/family education;Passive range of motion;Dry needling;Vasopneumatic Device    PT Next Visit Plan Right hamstring stretching and STW/M, Nustep, quadriceps strengthning.    Consulted and Agree with Plan of Care Patient           Patient will benefit from skilled therapeutic intervention in order to improve the following deficits and impairments:  Abnormal  gait,Difficulty walking,Decreased activity tolerance,Decreased strength,Decreased range of motion  Visit Diagnosis: Chronic pain of right knee  Muscle weakness (generalized)  Problem List Patient Active Problem List   Diagnosis Date Noted  . Acute respiratory failure with hypoxia ([place]) 09/16/2019  . Osteoarthritis of spine with radiculopathy, lumbar region 01/08/2018  . Thyroid disease   . Aortic atherosclerosis ([place]) 05/02/2017  . Thrombocytopenia ([place]) 07/10/2016  . Hereditary and idiopathic peripheral neuropathy 10/16/2015  . Mononeuropathy 10/04/2015  . Allergic rhinitis 05/06/2014  . Depression 08/03/2013  . Sick sinus syndrome ([place]) 07/31/2012  . Hypertensive heart disease without CHF   . Chronic atrial fibrillation ([place])   . Coronary artery disease involving native coronary artery of native heart with angina pectoris ([place])   . History of TIAs   . Lumbar disc disease   . GERD (gastroesophageal reflux disease)   . Chronic anticoagulation   . Benign prostatic hyperplasia   . Hyperlipidemia     Standley Brooking, PTA 02/06/2021, 10:23 AM  [facility] [address] [place], [place], [address] Phone: [phone]   Fax:  [phone]  Name: Laurin Morgenstern MRN: [id number] Date of Birth: 06-10-1929

## 2021-02-09 ENCOUNTER — Encounter: Payer: Medicare Other | Admitting: *Deleted

## 2021-02-13 ENCOUNTER — Ambulatory Visit: Payer: Medicare Other | Admitting: Internal Medicine

## 2021-02-17 DIAGNOSIS — I700 Atherosclerosis of aorta: Secondary | ICD-10-CM | POA: Diagnosis not present

## 2021-02-17 DIAGNOSIS — R310 Gross hematuria: Secondary | ICD-10-CM | POA: Diagnosis not present

## 2021-02-17 DIAGNOSIS — K5750 Diverticulosis of both small and large intestine without perforation or abscess without bleeding: Secondary | ICD-10-CM | POA: Diagnosis not present

## 2021-02-17 DIAGNOSIS — K429 Umbilical hernia without obstruction or gangrene: Secondary | ICD-10-CM | POA: Diagnosis not present

## 2021-02-22 ENCOUNTER — Encounter: Payer: Self-pay | Admitting: Family Medicine

## 2021-02-22 ENCOUNTER — Other Ambulatory Visit: Payer: Self-pay

## 2021-02-22 ENCOUNTER — Ambulatory Visit (INDEPENDENT_AMBULATORY_CARE_PROVIDER_SITE_OTHER): Payer: Medicare Other | Admitting: Family Medicine

## 2021-02-22 VITALS — BP 145/74 | HR 93 | Temp 97.5°F | Ht 72.0 in | Wt 164.0 lb

## 2021-02-22 DIAGNOSIS — I482 Chronic atrial fibrillation, unspecified: Secondary | ICD-10-CM | POA: Diagnosis not present

## 2021-02-22 DIAGNOSIS — E079 Disorder of thyroid, unspecified: Secondary | ICD-10-CM | POA: Diagnosis not present

## 2021-02-22 DIAGNOSIS — Z950 Presence of cardiac pacemaker: Secondary | ICD-10-CM

## 2021-02-22 DIAGNOSIS — Z7901 Long term (current) use of anticoagulants: Secondary | ICD-10-CM

## 2021-02-22 DIAGNOSIS — I700 Atherosclerosis of aorta: Secondary | ICD-10-CM

## 2021-02-22 DIAGNOSIS — R319 Hematuria, unspecified: Secondary | ICD-10-CM | POA: Diagnosis not present

## 2021-02-22 DIAGNOSIS — Z0000 Encounter for general adult medical examination without abnormal findings: Secondary | ICD-10-CM

## 2021-02-22 LAB — URINALYSIS, COMPLETE
Bilirubin, UA: NEGATIVE
Glucose, UA: NEGATIVE
Ketones, UA: NEGATIVE
Leukocytes,UA: NEGATIVE
Nitrite, UA: NEGATIVE
Protein,UA: NEGATIVE
Specific Gravity, UA: 1.015 (ref 1.005–1.030)
Urobilinogen, Ur: 0.2 mg/dL (ref 0.2–1.0)
pH, UA: 8.5 — ABNORMAL HIGH (ref 5.0–7.5)

## 2021-02-22 LAB — MICROSCOPIC EXAMINATION
Bacteria, UA: NONE SEEN
Epithelial Cells (non renal): NONE SEEN /hpf (ref 0–10)
RBC, Urine: NONE SEEN /hpf (ref 0–2)
WBC, UA: NONE SEEN /hpf (ref 0–5)

## 2021-02-22 LAB — COAGUCHEK XS/INR WAIVED
INR: 1.4 — ABNORMAL HIGH (ref 0.9–1.1)
Prothrombin Time: 16.8 s

## 2021-02-22 NOTE — Patient Instructions (Signed)
Preventive Care 27 Years and Older, Male Preventive care refers to lifestyle choices and visits with your health care provider that can promote health and wellness. This includes:  A yearly physical exam. This is also called an annual wellness visit.  Regular dental and eye exams.  Immunizations.  Screening for certain conditions.  Healthy lifestyle choices, such as: ? Eating a healthy diet. ? Getting regular exercise. ? Not using drugs or products that contain nicotine and tobacco. ? Limiting alcohol use. What can I expect for my preventive care visit? Physical exam Your health care provider will check your:  Height and weight. These may be used to calculate your BMI (body mass index). BMI is a measurement that tells if you are at a healthy weight.  Heart rate and blood pressure.  Body temperature.  Skin for abnormal spots. Counseling Your health care provider may ask you questions about your:  Past medical problems.  Family's medical history.  Alcohol, tobacco, and drug use.  Emotional well-being.  Home life and relationship well-being.  Sexual activity.  Diet, exercise, and sleep habits.  History of falls.  Memory and ability to understand (cognition).  Work and work Statistician.  Access to firearms. What immunizations do I need? Vaccines are usually given at various ages, according to a schedule. Your health care provider will recommend vaccines for you based on your age, medical history, and lifestyle or other factors, such as travel or where you work.   What tests do I need? Blood tests  Lipid and cholesterol levels. These may be checked every 5 years, or more often depending on your overall health.  Hepatitis C test.  Hepatitis B test. Screening  Lung cancer screening. You may have this screening every year starting at age 73 if you have a 30-pack-year history of smoking and currently smoke or have quit within the past 15 years.  Colorectal  cancer screening. ? All adults should have this screening starting at age 50 and continuing until age 31. ? Your health care provider may recommend screening at age 27 if you are at increased risk. ? You will have tests every 1-10 years, depending on your results and the type of screening test.  Prostate cancer screening. Recommendations will vary depending on your family history and other risks.  Genital exam to check for testicular cancer or hernias.  Diabetes screening. ? This is done by checking your blood sugar (glucose) after you have not eaten for a while (fasting). ? You may have this done every 1-3 years.  Abdominal aortic aneurysm (AAA) screening. You may need this if you are a current or former smoker.  STD (sexually transmitted disease) testing, if you are at risk. Follow these instructions at home: Eating and drinking  Eat a diet that includes fresh fruits and vegetables, whole grains, lean protein, and low-fat dairy products. Limit your intake of foods with high amounts of sugar, saturated fats, and salt.  Take vitamin and mineral supplements as recommended by your health care provider.  Do not drink alcohol if your health care provider tells you not to drink.  If you drink alcohol: ? Limit how much you have to 0-2 drinks a day. ? Be aware of how much alcohol is in your drink. In the [place] one drink equals one 12 oz bottle of beer (355 mL), one 5 oz glass of wine (148 mL), or one 1 oz glass of hard liquor (44 mL).   Lifestyle  Take daily care of your teeth  and gums. Brush your teeth every morning and night with fluoride toothpaste. Floss one time each day.  Stay active. Exercise for at least 30 minutes 5 or more days each week.  Do not use any products that contain nicotine or tobacco, such as cigarettes, e-cigarettes, and chewing tobacco. If you need help quitting, ask your health care provider.  Do not use drugs.  If you are sexually active, practice safe sex.  Use a condom or other form of protection to prevent STIs (sexually transmitted infections).  Talk with your health care provider about taking a low-dose aspirin or statin.  Find healthy ways to cope with stress, such as: ? Meditation, yoga, or listening to music. ? Journaling. ? Talking to a trusted person. ? Spending time with friends and family. Safety  Always wear your seat belt while driving or riding in a vehicle.  Do not drive: ? If you have been drinking alcohol. Do not ride with someone who has been drinking. ? When you are tired or distracted. ? While texting.  Wear a helmet and other protective equipment during sports activities.  If you have firearms in your house, make sure you follow all gun safety procedures. What's next?  Visit your health care provider once a year for an annual wellness visit.  Ask your health care provider how often you should have your eyes and teeth checked.  Stay up to date on all vaccines. This information is not intended to replace advice given to you by your health care provider. Make sure you discuss any questions you have with your health care provider. Document Revised: 09/01/2019 Document Reviewed: 11/27/2018 Elsevier Patient Education  2021 [employer].

## 2021-02-22 NOTE — Progress Notes (Signed)
Dalton Vaughan is a 87 y.o. male presents to office today for annual physical exam examination.    Concerns today include: 1.  Hematuria Patient reports onset of hematuria on February 05, 2021.  He saw Dr. Alinda Money, his urologist, and had a CT scan done but this did not show the etiology of his bleed.  He notes that during the time of the bleed he had discontinued his blood thinner and then after the CT scan resumed it.  He discontinued again yesterday evening due to recurrent hematuria.  He has an appointment on Friday to get labs and then will be seeing Dr. Alinda Money next Friday.  He does report some dysuria today.  No new back pain, unplanned weight loss, chills or fevers.  No reports of nausea or vomiting.  No abdominal pain.  2.  Hypothyroidism Compliant with Synthroid.  No reports of heart palpitation.  But he does report some constipation that required quite a bit of straining.  No hematochezia or melena  3.  Atrial fibrillation Patient is compliant with his medications except for the Coumadin which she has positive hematuria as above.  No reports of heart palpitation.  He does report intermittent mild epistaxis from the right nare.  This is not long lived.  He is not on any steroid nasal sprays but uses ayr.  Occupation: retired  Diet: fair, Exercise: tries to stay active Last eye exam: due in a couple weeks Last dental exam: UTD Last colonoscopy: UTD Immunizations needed:  Immunization History  Administered Date(s) Administered  . Fluad Quad(high Dose 65+) 10/11/2020  . Influenza, High Dose Seasonal PF 11/05/2016, 10/08/2017, 09/24/2018, 10/19/2019  . Influenza,inj,Quad PF,6+ Mos 09/15/2013, 09/22/2014, 10/04/2015  . Moderna Sars-Covid-2 Vaccination 02/16/2020, 03/15/2020  . Pneumococcal Conjugate-13 01/04/2014  . Pneumococcal Polysaccharide-23 12/17/1996, 11/15/2015  . Zoster Recombinat (Shingrix) 01/26/2019, 05/06/2019     Past Medical History:  Diagnosis Date  . Anal fissure    . Arthritis   . Atrial fibrillation ([place])   . BPH (benign prostatic hypertrophy)   . CAD (coronary artery disease)    Dr. Wynonia Lawman  . Cataract   . COVID-19   . Depression 2004  . Diverticulosis   . GERD (gastroesophageal reflux disease)   . History of TIAs   . Hyperlipidemia   . Hypertension   . Hypertensive heart disease without CHF   . Hypothyroidism   . Internal hemorrhoids   . Lumbar disc disease   . Oral bleeding 08/20/2012   Following molar tooth extraction July, 2013  . Pneumonia   . Ruptured disk 1995  . Shingles   . Thyroid disease   . Vitamin D deficiency   . Vitreous detachment    Social History   Socioeconomic History  . Marital status: Widowed    Spouse name: [name]   . Number of children: 1  . Years of education: 29  . Highest education level: Not on file  Occupational History  . Occupation: Retired    Comment: Social research officer, government x 27 years  . 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 Smoking status: Former Smoker    Packs/day: 1.00    Years: 18.00    Pack years: 18.00    Types: Cigarettes    Start date: 12/17/1948    Quit date: 12/17/1966    Years since quitting: 54.2  . Smokeless tobacco: Never Used  Vaping Use  . Vaping Use: Never used  Substance and Sexual Activity  . Alcohol use: Yes    Alcohol/week: 1.0 standard drink    Types: 1 Standard drinks or equivalent per week    Comment: rare  . Drug use: No  . Sexual activity: Not on file  Other Topics Concern  . Not on file  Social History Narrative   Married.  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 bilateral cataract surgery  6/07  . CARDIAC CATHETERIZATION    . CORONARY ARTERY BYPASS GRAFT  12/99    Dr. Servando Snare   . cysloscopy  8/08  . EYE SURGERY    . HERNIA REPAIR  1997   right  . herninated disc  1995  . PACEMAKER INSERTION  11/03/2020  . PROSTATE BIOPSY  8/08  . TONSILLECTOMY AND ADENOIDECTOMY     Family History  Problem Relation Age of Onset  . Dementia Mother   . Arthritis Mother   . Cancer Brother        prostate  . Asthma Sister   . Heart disease Paternal Aunt   . Heart disease Paternal Uncle   . Hypertension Maternal Grandmother   . CVA Maternal Grandmother   . CVA Paternal Grandmother   . Hepatitis C Son   . Arthritis Son   . Colon cancer Neg Hx   . Colon polyps Neg Hx   . Kidney disease Neg Hx   . Esophageal cancer Neg Hx   . Gallbladder disease Neg Hx   . Diabetes Neg Hx     Current Outpatient Medications:  .  amLODipine (NORVASC) 5 MG tablet, Take by mouth., Disp: , Rfl:  .  Calcium Carb-Cholecalciferol (CALCIUM + D3) 600-200 MG-UNIT TABS, Take 1 tablet by mouth daily. , Disp: , Rfl:  .  Cholecalciferol (VITAMIN D-3) 5000 UNITS TABS, Take 1 tablet by mouth daily., Disp: , Rfl:  .  Coenzyme Q10 (CO Q 10 PO), Take 1 tablet by mouth daily., Disp: , Rfl:  .  doxazosin (CARDURA) 8 MG tablet, Take 1 tablet (8 mg total) by mouth at bedtime., Disp: 30 tablet, Rfl: 0 .  famotidine (PEPCID) 20 MG tablet, Take 1 tablet (20 mg total) by mouth daily after supper., Disp: 30 tablet, Rfl: 1 .  levothyroxine (SYNTHROID) 50 MCG tablet, Take 1 tablet (50 mcg total) by mouth daily before breakfast., Disp: 90 tablet, Rfl: 2 .  mirtazapine (REMERON) 15 MG tablet, TAKE 1 TABLET AT BEDTIME, Disp: 90 tablet, Rfl: 3 .  Multiple Vitamin (MULTIVITAMIN) tablet, Take 1 tablet by mouth daily., Disp: , Rfl:  .  ramipril (ALTACE) 10 MG capsule, TAKE 1 CAPSULE DAILY, Disp: 90 capsule, Rfl: 1 .  rosuvastatin (CRESTOR) 5 MG tablet, Take  1 tablet (5 mg total) by mouth daily. as directed (Patient taking differently: Take 5 mg by mouth every Monday, Wednesday, and Friday.), Disp: 90 tablet, Rfl: 3 .  SIMETHICONE PO, Take by mouth as needed., Disp: , Rfl:  .  triamterene-hydrochlorothiazide (MAXZIDE-25) 37.5-25 MG tablet, Take 1 tablet by mouth daily., Disp: 90 tablet, Rfl:  3 .  vitamin C (ASCORBIC ACID) 500 MG tablet, Take 500 mg by mouth daily. , Disp: , Rfl:  .  warfarin (COUMADIN) 5 MG tablet, TAKE AS DIRECTED, Disp: 90 tablet, Rfl: 3 .  XIIDRA 5 % SOLN, Apply 1 drop to eye as needed. , Disp: , Rfl:   Allergies  Allergen Reactions  . Paxil [Paroxetine Hcl] Palpitations  . Eliquis [Apixaban] Other (See Comments)    dizziness  . Penicillins Other (See Comments)    Did it involve swelling of the face/tongue/throat, SOB, or low BP? Unknown Did it involve sudden or severe rash/hives, skin peeling, or any reaction on the inside of your mouth or nose? Unknown Did you need to seek medical attention at a hospital or doctor's office? Unknown When did it last happen?unk If all above answers are "NO", may proceed with cephalosporin use.   Christy Sartorius Sodium] Other (See Comments)    myalgias  . Zetia [Ezetimibe] Other (See Comments)    myalgias  . Vytorin [Ezetimibe-Simvastatin] Other (See Comments)    myalgias     ROS: Review of Systems Pertinent items noted in HPI and remainder of comprehensive ROS otherwise negative.    Physical exam BP (!) 145/74   Pulse 93   Temp (!) 97.5 F (36.4 C) (Temporal)   Ht 6' (1.829 m)   Wt 164 lb (74.4 kg)   SpO2 96%   BMI 22.24 kg/m  General appearance: alert, cooperative, appears stated age and no distress Head: Normocephalic, without obvious abnormality, atraumatic Eyes: No icterus.  Evidence of previous cataract surgery noted bilaterally.  Pupils are equal and reactive.  EOMI. Ears: right TM ocluded by wax; left TM normal Nose: Nares normal. Septum midline.  Mucosa normal. No drainage or sinus tenderness. Throat: lips, mucosa, and tongue normal; teeth and gums normal Neck: no adenopathy, no carotid bruit, supple, symmetrical, trachea midline and thyroid not enlarged, symmetric, no tenderness/mass/nodules Back: symmetric, no curvature. ROM normal. No CVA tenderness. Lungs: clear to auscultation bilaterally Chest wall: no tenderness Heart: irregularly irregular rhythm Abdomen: soft, non-tender; bowel sounds normal; no masses,  no organomegaly Extremities: extremities normal, atraumatic, no cyanosis or edema Pulses: 2+ and symmetric Skin: Multiple pigmented skin lesions and multiple seborrheic keratoses noted throughout the back upper extremities and neck Lymph nodes: Cervical, supraclavicular, and axillary nodes normal. Neurologic: Alert and oriented X 3, normal strength and tone. Normal symmetric reflexes. Normal coordination and gait Psych: Mood is stable.  Depression screen [facility] 2/9 02/22/2021 11/24/2020 10/11/2020  Decreased Interest 1 0 0  Down, Depressed, Hopeless 1 0 0  PHQ - 2 Score 2 0 0  Altered sleeping 0 - -  Tired, decreased energy 1 - -  Change in appetite 1 - -  Feeling bad or failure about yourself  0 - -  Trouble concentrating 1 - -  Moving slowly or fidgety/restless 0 - -  Suicidal thoughts 0 - -  PHQ-9 Score 5 - -  Difficult doing work/chores - - -  Some recent data might be hidden   Assessment/ Plan: Dalton Vaughan here for annual physical exam.   Annual physical exam  Chronic atrial fibrillation ([place]) - Plan: CoaguChek XS/INR Waived  Chronic anticoagulation - Plan: CoaguChek XS/INR Waived  S/P placement of cardiac pacemaker  Aortic atherosclerosis ([facility]) - Plan: Lipid Panel  Thyroid disease - Plan: Thyroid Panel With TSH  Hematuria, unspecified type - Plan: Urinalysis, Complete, CBC, PSA  Rate controlled.  His INR is subtherapeutic but he is  holding this medication while his hematuria is further evaluated.  We  discussed the risk of CVA  Check fasting lipid  Check thyroid panel  CBC and PSA were ordered.  UA with trace blood on urine dip but none visualized under the microscope.  No evidence of infection.  Counseled on healthy lifestyle choices, including diet (rich in fruits, vegetables and lean meats and low in salt and simple carbohydrates) and exercise (at least 30 minutes of moderate physical activity daily).  Patient to follow up in 1 year for annual exam or sooner if needed.  Dalton Vaughan M. Lajuana Ripple, DO

## 2021-02-23 LAB — CBC
Hematocrit: 42.2 % (ref 37.5–51.0)
Hemoglobin: 14.3 g/dL (ref 13.0–17.7)
MCH: 31 pg (ref 26.6–33.0)
MCHC: 33.9 g/dL (ref 31.5–35.7)
MCV: 91 fL (ref 79–97)
Platelets: 147 10*3/uL — ABNORMAL LOW (ref 150–450)
RBC: 4.62 x10E6/uL (ref 4.14–5.80)
RDW: 14 % (ref 11.6–15.4)
WBC: 5.4 10*3/uL (ref 3.4–10.8)

## 2021-02-23 LAB — THYROID PANEL WITH TSH
Free Thyroxine Index: 2.1 (ref 1.2–4.9)
T3 Uptake Ratio: 29 % (ref 24–39)
T4, Total: 7.1 ug/dL (ref 4.5–12.0)
TSH: 3.97 u[IU]/mL (ref 0.450–4.500)

## 2021-02-23 LAB — LIPID PANEL
Chol/HDL Ratio: 3.2 ratio (ref 0.0–5.0)
Cholesterol, Total: 175 mg/dL (ref 100–199)
HDL: 54 mg/dL (ref >39)
LDL Chol Calc (NIH): 107 mg/dL — ABNORMAL HIGH (ref 0–99)
Triglycerides: 74 mg/dL (ref 0–149)
VLDL Cholesterol Cal: 14 mg/dL (ref 5–40)

## 2021-02-23 LAB — PSA: Prostate Specific Ag, Serum: 32.4 ng/mL — ABNORMAL HIGH (ref 0.0–4.0)

## 2021-03-03 DIAGNOSIS — R972 Elevated prostate specific antigen [PSA]: Secondary | ICD-10-CM | POA: Diagnosis not present

## 2021-03-10 DIAGNOSIS — R972 Elevated prostate specific antigen [PSA]: Secondary | ICD-10-CM | POA: Diagnosis not present

## 2021-03-10 DIAGNOSIS — R310 Gross hematuria: Secondary | ICD-10-CM | POA: Diagnosis not present

## 2021-03-10 DIAGNOSIS — R351 Nocturia: Secondary | ICD-10-CM | POA: Diagnosis not present

## 2021-03-10 DIAGNOSIS — N401 Enlarged prostate with lower urinary tract symptoms: Secondary | ICD-10-CM | POA: Diagnosis not present

## 2021-03-15 DIAGNOSIS — H02834 Dermatochalasis of left upper eyelid: Secondary | ICD-10-CM | POA: Diagnosis not present

## 2021-03-15 DIAGNOSIS — Z961 Presence of intraocular lens: Secondary | ICD-10-CM | POA: Diagnosis not present

## 2021-03-15 DIAGNOSIS — H16223 Keratoconjunctivitis sicca, not specified as Sjogren's, bilateral: Secondary | ICD-10-CM | POA: Diagnosis not present

## 2021-03-15 DIAGNOSIS — H43811 Vitreous degeneration, right eye: Secondary | ICD-10-CM | POA: Diagnosis not present

## 2021-03-15 DIAGNOSIS — H0102A Squamous blepharitis right eye, upper and lower eyelids: Secondary | ICD-10-CM | POA: Diagnosis not present

## 2021-03-15 DIAGNOSIS — H02831 Dermatochalasis of right upper eyelid: Secondary | ICD-10-CM | POA: Diagnosis not present

## 2021-03-15 DIAGNOSIS — H11823 Conjunctivochalasis, bilateral: Secondary | ICD-10-CM | POA: Diagnosis not present

## 2021-03-15 DIAGNOSIS — H0102B Squamous blepharitis left eye, upper and lower eyelids: Secondary | ICD-10-CM | POA: Diagnosis not present

## 2021-03-16 ENCOUNTER — Other Ambulatory Visit: Payer: Self-pay

## 2021-03-16 ENCOUNTER — Ambulatory Visit: Payer: Medicare Other | Attending: Orthopedic Surgery | Admitting: Physical Therapy

## 2021-03-16 DIAGNOSIS — M6281 Muscle weakness (generalized): Secondary | ICD-10-CM | POA: Diagnosis not present

## 2021-03-16 DIAGNOSIS — M25561 Pain in right knee: Secondary | ICD-10-CM | POA: Insufficient documentation

## 2021-03-16 DIAGNOSIS — G8929 Other chronic pain: Secondary | ICD-10-CM | POA: Diagnosis not present

## 2021-03-16 NOTE — Therapy (Signed)
[facility] [place], [place], [address] Phone: [phone]   Fax:  [phone]  Physical Therapy Treatment  Patient Details  Name: Dalton Vaughan MRN: [id number] Date of Birth: September 05, 1929 Referring Provider (PT): Fenton Foy PA-C.   Encounter Date: 03/16/2021   PT End of Session - 03/16/21 1457    Visit Number 4    Number of Visits 12    Date for PT Re-Evaluation 03/01/21    Authorization Type FOTO AT LEAST EVERY 5TH VISIT.  PROGRESS NOTE AT 10TH VISIT.  KX MODIFIER AFTER 15 VISITS.    PT Start Time 0227    PT Stop Time 0307    PT Time Calculation (min) 40 min    Activity Tolerance Patient tolerated treatment well    Behavior During Therapy [facility] for tasks assessed/performed           Past Medical History:  Diagnosis Date  . Anal fissure   . Arthritis   . Atrial fibrillation ([place])   . BPH (benign prostatic hypertrophy)   . CAD (coronary artery disease)    Dr. Wynonia Lawman  . Cataract   . COVID-19   . Depression 2004  . Diverticulosis   . GERD (gastroesophageal reflux disease)   . History of TIAs   . Hyperlipidemia   . Hypertension   . Hypertensive heart disease without CHF   . Hypothyroidism   . Internal hemorrhoids   . Lumbar disc disease   . Oral bleeding 08/20/2012   Following molar tooth extraction July, 2013  . Pneumonia   . Ruptured disk 1995  . Shingles   . Thyroid disease   . Vitamin D deficiency   . Vitreous detachment     Past Surgical History:  Procedure Laterality Date  . bilateral cataract surgery  6/07  . CARDIAC CATHETERIZATION    . CORONARY ARTERY BYPASS GRAFT  12/99    Dr. Servando Snare   . cysloscopy  8/08  . EYE SURGERY    . HERNIA REPAIR  1997   right  . herninated disc  1995  . PACEMAKER INSERTION  11/03/2020  . PROSTATE BIOPSY  8/08  . TONSILLECTOMY AND ADENOIDECTOMY      There were no vitals filed for this visit.   Subjective Assessment - 03/16/21 1428    Subjective COVID 19  screening performed on patient upon arrival. Patient arrived with less pain today    Pertinent History PACEMAKER, Atrial fib, lumbar disc disease, HTN, CHF, h/o neck pain.    How long can you walk comfortably? Around home and short community distances.    Patient Stated Goals Regain range of motion in knee and do more.    Currently in Pain? Yes    Pain Score 3     Pain Location Knee    Pain Orientation Right    Pain Descriptors / Indicators Discomfort    Pain Type Surgical pain    Pain Onset More than a month ago    Pain Frequency Constant    Aggravating Factors  increased activity    Pain Relieving Factors at rest              [facility] PT Assessment - 03/16/21 0001      ROM / Strength   AROM / PROM / Strength AROM;PROM      AROM   AROM Assessment Site Knee    Right/Left Knee Right    Right Knee Extension -5    Right Knee Flexion 110  PROM   PROM Assessment Site Knee    Right/Left Knee Right    Right Knee Extension -2    Right Knee Flexion 110                         [facility] Adult PT Treatment/Exercise - 03/16/21 0001      Knee/Hip Exercises: Aerobic   Nustep L5 x15 min      Knee/Hip Exercises: Machines for Strengthening   Cybex Knee Extension 10# 2x10 reps    Cybex Knee Flexion 30# 2x10 reps      Vasopneumatic   Number Minutes Vasopneumatic  10 minutes    Vasopnuematic Location  Knee    Vasopneumatic Pressure Low    Vasopneumatic Temperature  34/pain      Manual Therapy   Manual Therapy Passive ROM    Passive ROM manual PROM for right knee flexion/ext with low load holds to improve mobility                       PT Long Term Goals - 03/16/21 1429      PT LONG TERM GOAL #1   Title Independent with a HEP.    Time 4    Period Weeks    Status On-going      PT LONG TERM GOAL #2   Title Active right knee flexion to 115-120 degrees+ so the patient can perform functional tasks and do so with pain not > 2-3/10.    Baseline AROM  105 03/16/21    Time 4    Period Weeks    Status On-going      PT LONG TERM GOAL #3   Title Full active knee extension in order to normalize gait.    Baseline AROM -5 degrees 03/16/21    Time 4    Period Weeks    Status On-going      PT LONG TERM GOAL #4   Title Perform ADL's with pain not > 2-3/10.    Baseline Met with 2-3/10 03/16/21    Time 4    Period Weeks    Status Achieved                 Plan - 03/16/21 1458    Clinical Impression Statement Patient tolerated treatment well today. Patient reported overall decreased pain and able to perfrom ADL's with greater ease. patient improved with ROM today for flexion and ext. Patient has reported walking on tredmill at home and doing exercises daily. Patient met LTG today with others progresisng.    Personal Factors and Comorbidities Comorbidity 1;Comorbidity 2    Comorbidities PACEMAKER, Atrial fib, lumbar disc disease, HTN, CHF, h/o neck pain.    Examination-Activity Limitations Locomotion Level;Other    Examination-Participation Restrictions Other    Stability/Clinical Decision Making Evolving/Moderate complexity    Rehab Potential Good    PT Frequency 3x / week    PT Duration 4 weeks    PT Treatment/Interventions ADLs/Self Care Home Management;Cryotherapy;Moist Heat;Gait training;Stair training;Functional mobility training;Therapeutic activities;Therapeutic exercise;Neuromuscular re-education;Manual techniques;Patient/family education;Passive range of motion;Dry needling;Vasopneumatic Device    PT Next Visit Plan cont with POC for Right hamstring stretching and STW/M, Nustep, quadriceps strengthning.           Patient will benefit from skilled therapeutic intervention in order to improve the following deficits and impairments:  Abnormal gait,Difficulty walking,Decreased activity tolerance,Decreased strength,Decreased range of motion  Visit Diagnosis: Chronic pain of right knee  Muscle weakness  (  generalized)     Problem List Patient Active Problem List   Diagnosis Date Noted  . Acute respiratory failure with hypoxia ([place]) 09/16/2019  . Osteoarthritis of spine with radiculopathy, lumbar region 01/08/2018  . Thyroid disease   . Aortic atherosclerosis ([place]) 05/02/2017  . Thrombocytopenia ([place]) 07/10/2016  . Hereditary and idiopathic peripheral neuropathy 10/16/2015  . Mononeuropathy 10/04/2015  . Allergic rhinitis 05/06/2014  . Depression 08/03/2013  . Sick sinus syndrome ([place]) 07/31/2012  . Hypertensive heart disease without CHF   . Chronic atrial fibrillation ([place])   . Coronary artery disease involving native coronary artery of native heart with angina pectoris ([place])   . History of TIAs   . Lumbar disc disease   . GERD (gastroesophageal reflux disease)   . Chronic anticoagulation   . Benign prostatic hyperplasia   . Hyperlipidemia     Ladean Raya, PTA 03/16/21 3:08 PM  [facility] [place], [place], [address] Phone: [phone]   Fax:  [phone]  Name: Dalton Vaughan MRN: [id number] Date of Birth: [birth year]

## 2021-03-21 ENCOUNTER — Ambulatory Visit: Payer: Medicare Other | Attending: Orthopedic Surgery | Admitting: *Deleted

## 2021-03-21 ENCOUNTER — Other Ambulatory Visit: Payer: Self-pay

## 2021-03-21 DIAGNOSIS — G8929 Other chronic pain: Secondary | ICD-10-CM | POA: Insufficient documentation

## 2021-03-21 DIAGNOSIS — M6281 Muscle weakness (generalized): Secondary | ICD-10-CM | POA: Insufficient documentation

## 2021-03-21 DIAGNOSIS — M25561 Pain in right knee: Secondary | ICD-10-CM | POA: Diagnosis not present

## 2021-03-21 NOTE — Therapy (Signed)
[facility] [place], [place], [address] Phone: [phone]   Fax:  [phone]  Physical Therapy Treatment  Patient Details  Name: Dalton Vaughan MRN: [id number] Date of Birth: 03/16/1929 Referring Provider (PT): Fenton Foy PA-C.   Encounter Date: 03/21/2021   PT End of Session - 03/21/21 1444    Visit Number 5    Number of Visits 12    Date for PT Re-Evaluation 04/07/21    Authorization Type FOTO AT LEAST EVERY 5TH VISIT.  PROGRESS NOTE AT 10TH VISIT.  KX MODIFIER AFTER 15 VISITS.    PT Start Time 1430    PT Stop Time 1518    PT Time Calculation (min) 48 min           Past Medical History:  Diagnosis Date  . Anal fissure   . Arthritis   . Atrial fibrillation ([place])   . BPH (benign prostatic hypertrophy)   . CAD (coronary artery disease)    Dr. Wynonia Lawman  . Cataract   . COVID-19   . Depression 2004  . Diverticulosis   . GERD (gastroesophageal reflux disease)   . History of TIAs   . Hyperlipidemia   . Hypertension   . Hypertensive heart disease without CHF   . Hypothyroidism   . Internal hemorrhoids   . Lumbar disc disease   . Oral bleeding 08/20/2012   Following molar tooth extraction July, 2013  . Pneumonia   . Ruptured disk 1995  . Shingles   . Thyroid disease   . Vitamin D deficiency   . Vitreous detachment     Past Surgical History:  Procedure Laterality Date  . bilateral cataract surgery  6/07  . CARDIAC CATHETERIZATION    . CORONARY ARTERY BYPASS GRAFT  12/99    Dr. Servando Snare   . cysloscopy  8/08  . EYE SURGERY    . HERNIA REPAIR  1997   right  . herninated disc  1995  . PACEMAKER INSERTION  11/03/2020  . PROSTATE BIOPSY  8/08  . TONSILLECTOMY AND ADENOIDECTOMY      There were no vitals filed for this visit.   Subjective Assessment - 03/21/21 1441    Subjective COVID 19 screening performed on patient upon arrival. Pain/tightness in RT hamstring area    Pertinent History PACEMAKER,  Atrial fib, lumbar disc disease, HTN, CHF, h/o neck pain.    How long can you walk comfortably? Around home and short community distances.    Patient Stated Goals Regain range of motion in knee and do more.    Currently in Pain? Yes    Pain Score 4     Pain Location Knee    Pain Orientation Right    Pain Descriptors / Indicators Discomfort    Pain Type Surgical pain    Pain Onset More than a month ago                             [facility] Adult PT Treatment/Exercise - 03/21/21 0001      Knee/Hip Exercises: Aerobic   Nustep L5 x15 min      Knee/Hip Exercises: Standing   Heel Raises Both;2 sets;15 reps    Heel Raises Limitations toe raises 2x20    Forward Lunges 2 sets;10 reps   8 in block pain free with quad control   Terminal Knee Extension AROM;Right;3 sets;10 reps   step outs   Rocker Board 3 minutes  Modalities   Modalities Vasopneumatic      Vasopneumatic   Number Minutes Vasopneumatic  10 minutes    Vasopnuematic Location  Knee    Vasopneumatic Pressure Low    Vasopneumatic Temperature  34/pain                       PT Long Term Goals - 03/16/21 1429      PT LONG TERM GOAL #1   Title Independent with a HEP.    Time 4    Period Weeks    Status On-going      PT LONG TERM GOAL #2   Title Active right knee flexion to 115-120 degrees+ so the patient can perform functional tasks and do so with pain not > 2-3/10.    Baseline AROM 105 03/16/21    Time 4    Period Weeks    Status On-going      PT LONG TERM GOAL #3   Title Full active knee extension in order to normalize gait.    Baseline AROM -5 degrees 03/16/21    Time 4    Period Weeks    Status On-going      PT LONG TERM GOAL #4   Title Perform ADL's with pain not > 2-3/10.    Baseline Met with 2-3/10 03/16/21    Time 4    Period Weeks    Status Achieved                  Patient will benefit from skilled therapeutic intervention in order to improve the following  deficits and impairments:     Visit Diagnosis: Chronic pain of right knee  Muscle weakness (generalized)     Problem List Patient Active Problem List   Diagnosis Date Noted  . Acute respiratory failure with hypoxia ([place]) 09/16/2019  . Osteoarthritis of spine with radiculopathy, lumbar region 01/08/2018  . Thyroid disease   . Aortic atherosclerosis ([place]) 05/02/2017  . Thrombocytopenia ([place]) 07/10/2016  . Hereditary and idiopathic peripheral neuropathy 10/16/2015  . Mononeuropathy 10/04/2015  . Allergic rhinitis 05/06/2014  . Depression 08/03/2013  . Sick sinus syndrome ([place]) 07/31/2012  . Hypertensive heart disease without CHF   . Chronic atrial fibrillation ([place])   . Coronary artery disease involving native coronary artery of native heart with angina pectoris ([place])   . History of TIAs   . Lumbar disc disease   . GERD (gastroesophageal reflux disease)   . Chronic anticoagulation   . Benign prostatic hyperplasia   . Hyperlipidemia     Tesla Keeler,CHRIS , PTA 03/21/2021, 6:15 PM  [facility] [place], [place], [address] Phone: [phone]   Fax:  [phone]  Name: Dalton Vaughan MRN: [id number] Date of Birth: 02-26-1929

## 2021-03-23 ENCOUNTER — Encounter: Payer: Self-pay | Admitting: Physical Therapy

## 2021-03-23 ENCOUNTER — Other Ambulatory Visit: Payer: Self-pay

## 2021-03-23 ENCOUNTER — Ambulatory Visit: Payer: Medicare Other | Admitting: Physical Therapy

## 2021-03-23 DIAGNOSIS — G8929 Other chronic pain: Secondary | ICD-10-CM | POA: Diagnosis not present

## 2021-03-23 DIAGNOSIS — M6281 Muscle weakness (generalized): Secondary | ICD-10-CM | POA: Diagnosis not present

## 2021-03-23 DIAGNOSIS — M25561 Pain in right knee: Secondary | ICD-10-CM | POA: Diagnosis not present

## 2021-03-23 NOTE — Therapy (Signed)
[facility] [place], [place], [address] Phone: [phone]   Fax:  [phone]  Physical Therapy Treatment  Patient Details  Name: Dalton Vaughan MRN: [id number] Date of Birth: 12/12/1929 Referring Provider (PT): Fenton Foy PA-C.   Encounter Date: 03/23/2021   PT End of Session - 03/23/21 1158    Visit Number 6    Number of Visits 12    Date for PT Re-Evaluation 04/07/21    Authorization Type FOTO AT LEAST EVERY 5TH VISIT.  PROGRESS NOTE AT 10TH VISIT.  KX MODIFIER AFTER 15 VISITS.    PT Start Time 1114    PT Stop Time 1206    PT Time Calculation (min) 52 min    Activity Tolerance Patient tolerated treatment well    Behavior During Therapy WFL for tasks assessed/performed           Past Medical History:  Diagnosis Date  . Anal fissure   . Arthritis   . Atrial fibrillation ([place])   . BPH (benign prostatic hypertrophy)   . CAD (coronary artery disease)    Dr. Wynonia Lawman  . Cataract   . COVID-19   . Depression 2004  . Diverticulosis   . GERD (gastroesophageal reflux disease)   . History of TIAs   . Hyperlipidemia   . Hypertension   . Hypertensive heart disease without CHF   . Hypothyroidism   . Internal hemorrhoids   . Lumbar disc disease   . Oral bleeding 08/20/2012   Following molar tooth extraction July, 2013  . Pneumonia   . Ruptured disk 1995  . Shingles   . Thyroid disease   . Vitamin D deficiency   . Vitreous detachment     Past Surgical History:  Procedure Laterality Date  . bilateral cataract surgery  6/07  . CARDIAC CATHETERIZATION    . CORONARY ARTERY BYPASS GRAFT  12/99    Dr. Servando Snare   . cysloscopy  8/08  . EYE SURGERY    . HERNIA REPAIR  1997   right  . herninated disc  1995  . PACEMAKER INSERTION  11/03/2020  . PROSTATE BIOPSY  8/08  . TONSILLECTOMY AND ADENOIDECTOMY      There were no vitals filed for this visit.   Subjective Assessment - 03/23/21 1156    Subjective COVID 19  screening performed on patient upon arrival. Pain/tightness in RT hamstring area and into calf.    Pertinent History PACEMAKER, Atrial fib, lumbar disc disease, HTN, CHF, h/o neck pain.    How long can you walk comfortably? Around home and short community distances.    Patient Stated Goals Regain range of motion in knee and do more.    Currently in Pain? No/denies              [facility] PT Assessment - 03/23/21 0001      Assessment   Medical Diagnosis Right knee OA.    Referring Provider (PT) Fenton Foy PA-C.    Hand Dominance Right    Next MD Visit 03/2021      Precautions   Precaution Comments PACEMAKER.      Restrictions   Weight Bearing Restrictions No      ROM / Strength   AROM / PROM / Strength AROM      AROM   Overall AROM  Within functional limits for tasks performed    AROM Assessment Site Knee    Right/Left Knee Right    Right Knee Extension 2  Right Knee Flexion 117                         [facility] Adult PT Treatment/Exercise - 03/23/21 0001      Knee/Hip Exercises: Stretches   Active Hamstring Stretch Right;3 reps;30 seconds    Active Hamstring Stretch Limitations off 8" step      Knee/Hip Exercises: Aerobic   Nustep L5 x18 min      Knee/Hip Exercises: Machines for Strengthening   Cybex Knee Extension 10# 2x10 reps    Cybex Knee Flexion 30# 3x10 reps    Cybex Leg Press 2 pl, sesat 7 x20 rep      Knee/Hip Exercises: Standing   Forward Lunges Right;15 reps;5 seconds    Rocker Board 3 minutes      Modalities   Modalities Vasopneumatic      Vasopneumatic   Number Minutes Vasopneumatic  10 minutes    Vasopnuematic Location  Knee    Vasopneumatic Pressure Medium    Vasopneumatic Temperature  34/discomfort                       PT Long Term Goals - 03/23/21 1208      PT LONG TERM GOAL #1   Title Independent with a HEP.    Time 4    Period Weeks    Status On-going      PT LONG TERM GOAL #2   Title Active right  knee flexion to 115-120 degrees+ so the patient can perform functional tasks and do so with pain not > 2-3/10.    Baseline AROM 105 03/16/21    Time 4    Period Weeks    Status Achieved      PT LONG TERM GOAL #3   Title Full active knee extension in order to normalize gait.    Baseline AROM -5 degrees 03/16/21    Time 4    Period Weeks    Status On-going      PT LONG TERM GOAL #4   Title Perform ADL's with pain not > 2-3/10.    Baseline Met with 2-3/10 03/16/21    Time 4    Period Weeks    Status Achieved                 Plan - 03/23/21 1200    Clinical Impression Statement Patient continues to report HS and knee tightness into R calf as well. Progression of stretching and strengthening completed within clinic with no reports of any excessive pain reported. Limited flexibilty of the R HS noted. Normal vasopneumatic response noted following removal of the modalities.    Personal Factors and Comorbidities Comorbidity 1;Comorbidity 2    Comorbidities PACEMAKER, Atrial fib, lumbar disc disease, HTN, CHF, h/o neck pain.    Examination-Activity Limitations Locomotion Level;Other    Examination-Participation Restrictions Other    Stability/Clinical Decision Making Evolving/Moderate complexity    Rehab Potential Good    PT Frequency 3x / week    PT Duration 4 weeks    PT Treatment/Interventions ADLs/Self Care Home Management;Cryotherapy;Moist Heat;Gait training;Stair training;Functional mobility training;Therapeutic activities;Therapeutic exercise;Neuromuscular re-education;Manual techniques;Patient/family education;Passive range of motion;Dry needling;Vasopneumatic Device    PT Next Visit Plan cont with POC for Right hamstring stretching and STW/M, Nustep, quadriceps strengthning.    Consulted and Agree with Plan of Care Patient           Patient will benefit from skilled therapeutic intervention in order to improve  the following deficits and impairments:  Abnormal  gait,Difficulty walking,Decreased activity tolerance,Decreased strength,Decreased range of motion  Visit Diagnosis: Chronic pain of right knee  Muscle weakness (generalized)     Problem List Patient Active Problem List   Diagnosis Date Noted  . Acute respiratory failure with hypoxia ([place]) 09/16/2019  . Osteoarthritis of spine with radiculopathy, lumbar region 01/08/2018  . Thyroid disease   . Aortic atherosclerosis ([place]) 05/02/2017  . Thrombocytopenia ([place]) 07/10/2016  . Hereditary and idiopathic peripheral neuropathy 10/16/2015  . Mononeuropathy 10/04/2015  . Allergic rhinitis 05/06/2014  . Depression 08/03/2013  . Sick sinus syndrome ([place]) 07/31/2012  . Hypertensive heart disease without CHF   . Chronic atrial fibrillation ([place])   . Coronary artery disease involving native coronary artery of native heart with angina pectoris ([place])   . History of TIAs   . Lumbar disc disease   . GERD (gastroesophageal reflux disease)   . Chronic anticoagulation   . Benign prostatic hyperplasia   . Hyperlipidemia     Standley Brooking, PTA 03/23/2021, 12:11 PM  [facility] [address] [place], [place], [address] Phone: [phone]   Fax:  [phone]  Name: Ridwan Bondy MRN: [id number] Date of Birth: 06-Jul-1929

## 2021-03-28 ENCOUNTER — Ambulatory Visit: Payer: Medicare Other | Admitting: Physical Therapy

## 2021-03-28 ENCOUNTER — Other Ambulatory Visit: Payer: Self-pay

## 2021-03-28 DIAGNOSIS — M25561 Pain in right knee: Secondary | ICD-10-CM

## 2021-03-28 DIAGNOSIS — G8929 Other chronic pain: Secondary | ICD-10-CM | POA: Diagnosis not present

## 2021-03-28 DIAGNOSIS — M6281 Muscle weakness (generalized): Secondary | ICD-10-CM | POA: Diagnosis not present

## 2021-03-28 NOTE — Therapy (Signed)
[facility] [place], [place], [address] Phone: [phone]   Fax:  [phone]  Physical Therapy Treatment  Patient Details  Name: Dalton Vaughan MRN: [id number] Date of Birth: 11-17-1929 Referring Provider (PT): Fenton Foy PA-C.   Encounter Date: 03/28/2021   PT End of Session - 03/28/21 1143    Visit Number 7    Number of Visits 12    Date for PT Re-Evaluation 04/07/21    Authorization Type FOTO AT LEAST EVERY 5TH VISIT.  PROGRESS NOTE AT 10TH VISIT.  KX MODIFIER AFTER 15 VISITS.    PT Start Time 1032    PT Stop Time 1119    PT Time Calculation (min) 47 min    Activity Tolerance Patient tolerated treatment well    Behavior During Therapy WFL for tasks assessed/performed           Past Medical History:  Diagnosis Date  . Anal fissure   . Arthritis   . Atrial fibrillation ([place])   . BPH (benign prostatic hypertrophy)   . CAD (coronary artery disease)    Dr. Wynonia Lawman  . Cataract   . COVID-19   . Depression 2004  . Diverticulosis   . GERD (gastroesophageal reflux disease)   . History of TIAs   . Hyperlipidemia   . Hypertension   . Hypertensive heart disease without CHF   . Hypothyroidism   . Internal hemorrhoids   . Lumbar disc disease   . Oral bleeding 08/20/2012   Following molar tooth extraction July, 2013  . Pneumonia   . Ruptured disk 1995  . Shingles   . Thyroid disease   . Vitamin D deficiency   . Vitreous detachment     Past Surgical History:  Procedure Laterality Date  . bilateral cataract surgery  6/07  . CARDIAC CATHETERIZATION    . CORONARY ARTERY BYPASS GRAFT  12/99    Dr. Servando Snare   . cysloscopy  8/08  . EYE SURGERY    . HERNIA REPAIR  1997   right  . herninated disc  1995  . PACEMAKER INSERTION  11/03/2020  . PROSTATE BIOPSY  8/08  . TONSILLECTOMY AND ADENOIDECTOMY      There were no vitals filed for this visit.   Subjective Assessment - 03/28/21 1137    Subjective COVID-19  screen performed prior to patient entering clinic.  Doing pretty good until yesterday.  Patient states that certain movements trigger the pain (he was referring to his right hamstring).    Pertinent History PACEMAKER, Atrial fib, lumbar disc disease, HTN, CHF, h/o neck pain.    Patient Stated Goals Regain range of motion in knee and do more.    Currently in Pain? Yes    Pain Score 5     Pain Location Knee    Pain Orientation Right    Pain Descriptors / Indicators Discomfort    Pain Onset More than a month ago                             [facility] Adult PT Treatment/Exercise - 03/28/21 0001      Exercises   Exercises Knee/Hip      Knee/Hip Exercises: Aerobic   Nustep Level 4 x 15 minutes.      Modalities   Modalities Vasopneumatic      Vasopneumatic   Number Minutes Vasopneumatic  15 minutes    Vasopnuematic Location  --   RT thigh.  Vasopneumatic Pressure Medium      Manual Therapy   Manual Therapy Soft tissue mobilization    Soft tissue mobilization In left sdly position with pillow between knees for comfort:  STW/M x 10 minutes along the disturbution of the patient's right hamstring.                       PT Long Term Goals - 03/23/21 1208      PT LONG TERM GOAL #1   Title Independent with a HEP.    Time 4    Period Weeks    Status On-going      PT LONG TERM GOAL #2   Title Active right knee flexion to 115-120 degrees+ so the patient can perform functional tasks and do so with pain not > 2-3/10.    Baseline AROM 105 03/16/21    Time 4    Period Weeks    Status Achieved      PT LONG TERM GOAL #3   Title Full active knee extension in order to normalize gait.    Baseline AROM -5 degrees 03/16/21    Time 4    Period Weeks    Status On-going      PT LONG TERM GOAL #4   Title Perform ADL's with pain not > 2-3/10.    Baseline Met with 2-3/10 03/16/21    Time 4    Period Weeks    Status Achieved                 Plan - 03/28/21  1142    Clinical Impression Statement Patient did well with STW/M to right hamstring today.    Personal Factors and Comorbidities Comorbidity 1;Comorbidity 2    Comorbidities PACEMAKER, Atrial fib, lumbar disc disease, HTN, CHF, h/o neck pain.    Examination-Activity Limitations Locomotion Level;Other    Stability/Clinical Decision Making Evolving/Moderate complexity    Rehab Potential Good    PT Frequency 3x / week    PT Duration 4 weeks    PT Treatment/Interventions ADLs/Self Care Home Management;Cryotherapy;Moist Heat;Gait training;Stair training;Functional mobility training;Therapeutic activities;Therapeutic exercise;Neuromuscular re-education;Manual techniques;Patient/family education;Passive range of motion;Dry needling;Vasopneumatic Device    PT Next Visit Plan cont with POC for Right hamstring stretching and STW/M, Nustep, quadriceps strengthning.    Consulted and Agree with Plan of Care Patient           Patient will benefit from skilled therapeutic intervention in order to improve the following deficits and impairments:  Abnormal gait,Difficulty walking,Decreased activity tolerance,Decreased strength,Decreased range of motion  Visit Diagnosis: Chronic pain of right knee     Problem List Patient Active Problem List   Diagnosis Date Noted  . Acute respiratory failure with hypoxia ([place]) 09/16/2019  . Osteoarthritis of spine with radiculopathy, lumbar region 01/08/2018  . Thyroid disease   . Aortic atherosclerosis ([place]) 05/02/2017  . Thrombocytopenia ([place]) 07/10/2016  . Hereditary and idiopathic peripheral neuropathy 10/16/2015  . Mononeuropathy 10/04/2015  . Allergic rhinitis 05/06/2014  . Depression 08/03/2013  . Sick sinus syndrome ([place]) 07/31/2012  . Hypertensive heart disease without CHF   . Chronic atrial fibrillation ([place])   . Coronary artery disease involving native coronary artery of native heart with angina pectoris ([place])   . History of TIAs   . Lumbar disc  disease   . GERD (gastroesophageal reflux disease)   . Chronic anticoagulation   . Benign prostatic hyperplasia   . Hyperlipidemia     Loral Campi, [place]  MPT 03/28/2021,  11:44 AM  [facility] [place], [place], [address] Phone: [phone]   Fax:  [phone]  Name: Caylan Schifano MRN: [id number] Date of Birth: August 10, 1929

## 2021-03-30 DIAGNOSIS — R9431 Abnormal electrocardiogram [ECG] [EKG]: Secondary | ICD-10-CM | POA: Diagnosis not present

## 2021-03-30 DIAGNOSIS — Z8673 Personal history of transient ischemic attack (TIA), and cerebral infarction without residual deficits: Secondary | ICD-10-CM | POA: Diagnosis not present

## 2021-03-30 DIAGNOSIS — I491 Atrial premature depolarization: Secondary | ICD-10-CM | POA: Diagnosis not present

## 2021-03-30 DIAGNOSIS — E039 Hypothyroidism, unspecified: Secondary | ICD-10-CM | POA: Diagnosis not present

## 2021-03-30 DIAGNOSIS — R55 Syncope and collapse: Secondary | ICD-10-CM | POA: Diagnosis not present

## 2021-03-30 DIAGNOSIS — I2510 Atherosclerotic heart disease of native coronary artery without angina pectoris: Secondary | ICD-10-CM | POA: Diagnosis not present

## 2021-03-30 DIAGNOSIS — E785 Hyperlipidemia, unspecified: Secondary | ICD-10-CM | POA: Diagnosis not present

## 2021-03-30 DIAGNOSIS — R0689 Other abnormalities of breathing: Secondary | ICD-10-CM | POA: Diagnosis not present

## 2021-03-30 DIAGNOSIS — F32A Depression, unspecified: Secondary | ICD-10-CM | POA: Diagnosis not present

## 2021-03-30 DIAGNOSIS — R42 Dizziness and giddiness: Secondary | ICD-10-CM | POA: Diagnosis not present

## 2021-03-30 DIAGNOSIS — I10 Essential (primary) hypertension: Secondary | ICD-10-CM | POA: Diagnosis not present

## 2021-03-30 DIAGNOSIS — Z87891 Personal history of nicotine dependence: Secondary | ICD-10-CM | POA: Diagnosis not present

## 2021-03-30 DIAGNOSIS — I482 Chronic atrial fibrillation, unspecified: Secondary | ICD-10-CM | POA: Diagnosis not present

## 2021-03-30 DIAGNOSIS — I447 Left bundle-branch block, unspecified: Secondary | ICD-10-CM | POA: Diagnosis not present

## 2021-03-30 DIAGNOSIS — I4891 Unspecified atrial fibrillation: Secondary | ICD-10-CM | POA: Diagnosis not present

## 2021-04-03 ENCOUNTER — Telehealth: Payer: Self-pay

## 2021-04-03 NOTE — Telephone Encounter (Signed)
Appointment scheduled.

## 2021-04-04 ENCOUNTER — Other Ambulatory Visit: Payer: Self-pay

## 2021-04-04 ENCOUNTER — Ambulatory Visit: Payer: Medicare Other | Admitting: Physical Therapy

## 2021-04-04 DIAGNOSIS — M25561 Pain in right knee: Secondary | ICD-10-CM | POA: Diagnosis not present

## 2021-04-04 DIAGNOSIS — M6281 Muscle weakness (generalized): Secondary | ICD-10-CM | POA: Diagnosis not present

## 2021-04-04 DIAGNOSIS — G8929 Other chronic pain: Secondary | ICD-10-CM

## 2021-04-04 NOTE — Therapy (Signed)
[facility] [place], [place], [address] Phone: [phone]   Fax:  [phone]  Physical Therapy Treatment  Patient Details  Name: Dalton Vaughan MRN: [id number] Date of Birth: June 14, 1929 Referring Provider (PT): Fenton Foy PA-C.   Encounter Date: 04/04/2021   PT End of Session - 04/04/21 1021    Visit Number 8    Number of Visits 12    Date for PT Re-Evaluation 04/07/21    Authorization Type FOTO AT LEAST EVERY 5TH VISIT.  PROGRESS NOTE AT 10TH VISIT.  KX MODIFIER AFTER 15 VISITS.    PT Start Time 0945    PT Stop Time 1030    PT Time Calculation (min) 45 min    Activity Tolerance Patient tolerated treatment well    Behavior During Therapy WFL for tasks assessed/performed           Past Medical History:  Diagnosis Date  . Anal fissure   . Arthritis   . Atrial fibrillation ([place])   . BPH (benign prostatic hypertrophy)   . CAD (coronary artery disease)    Dr. Wynonia Lawman  . Cataract   . COVID-19   . Depression 2004  . Diverticulosis   . GERD (gastroesophageal reflux disease)   . History of TIAs   . Hyperlipidemia   . Hypertension   . Hypertensive heart disease without CHF   . Hypothyroidism   . Internal hemorrhoids   . Lumbar disc disease   . Oral bleeding 08/20/2012   Following molar tooth extraction July, 2013  . Pneumonia   . Ruptured disk 1995  . Shingles   . Thyroid disease   . Vitamin D deficiency   . Vitreous detachment     Past Surgical History:  Procedure Laterality Date  . bilateral cataract surgery  6/07  . CARDIAC CATHETERIZATION    . CORONARY ARTERY BYPASS GRAFT  12/99    Dr. Servando Snare   . cysloscopy  8/08  . EYE SURGERY    . HERNIA REPAIR  1997   right  . herninated disc  1995  . PACEMAKER INSERTION  11/03/2020  . PROSTATE BIOPSY  8/08  . TONSILLECTOMY AND ADENOIDECTOMY      There were no vitals filed for this visit.   Subjective Assessment - 04/04/21 0957    Subjective COVID-19  screen performed prior to patient entering clinic.  That last treatment helped a lot.    Pertinent History PACEMAKER, Atrial fib, lumbar disc disease, HTN, CHF, h/o neck pain.    Patient Stated Goals Regain range of motion in knee and do more.    Currently in Pain? Yes    Pain Score 2     Pain Location Knee    Pain Orientation Right    Pain Descriptors / Indicators Discomfort    Pain Type Surgical pain    Pain Onset More than a month ago                             [facility] Adult PT Treatment/Exercise - 04/04/21 0001      Exercises   Exercises Knee/Hip      Knee/Hip Exercises: Aerobic   Nustep Level 4 x 15 minutes.      Modalities   Modalities Vasopneumatic      Vasopneumatic   Number Minutes Vasopneumatic  15 minutes    Vasopnuematic Location  --   RT thigh.   Vasopneumatic Pressure Medium  Manual Therapy   Manual Therapy Soft tissue mobilization    Soft tissue mobilization In right sdly position;  STW/m x 9 minutes to patient's right hamstring.                       PT Long Term Goals - 03/23/21 1208      PT LONG TERM GOAL #1   Title Independent with a HEP.    Time 4    Period Weeks    Status On-going      PT LONG TERM GOAL #2   Title Active right knee flexion to 115-120 degrees+ so the patient can perform functional tasks and do so with pain not > 2-3/10.    Baseline AROM 105 03/16/21    Time 4    Period Weeks    Status Achieved      PT LONG TERM GOAL #3   Title Full active knee extension in order to normalize gait.    Baseline AROM -5 degrees 03/16/21    Time 4    Period Weeks    Status On-going      PT LONG TERM GOAL #4   Title Perform ADL's with pain not > 2-3/10.    Baseline Met with 2-3/10 03/16/21    Time 4    Period Weeks    Status Achieved                 Plan - 04/04/21 1018    Clinical Impression Statement Patient has responded very well to the last couple of treatments.  He reports a significant  reduction in his right hamstring pain.  He notes greater ease with putting his right shoe and sock on.    Personal Factors and Comorbidities Comorbidity 1;Comorbidity 2    Comorbidities PACEMAKER, Atrial fib, lumbar disc disease, HTN, CHF, h/o neck pain.    Examination-Activity Limitations Locomotion Level;Other    Examination-Participation Restrictions Other    Stability/Clinical Decision Making Evolving/Moderate complexity    Rehab Potential Good    PT Frequency 3x / week    PT Duration 4 weeks    PT Treatment/Interventions ADLs/Self Care Home Management;Cryotherapy;Moist Heat;Gait training;Stair training;Functional mobility training;Therapeutic activities;Therapeutic exercise;Neuromuscular re-education;Manual techniques;Patient/family education;Passive range of motion;Dry needling;Vasopneumatic Device    PT Next Visit Plan cont with POC for Right hamstring stretching and STW/M, Nustep, quadriceps strengthning.    Consulted and Agree with Plan of Care Patient           Patient will benefit from skilled therapeutic intervention in order to improve the following deficits and impairments:  Abnormal gait,Difficulty walking,Decreased activity tolerance,Decreased strength,Decreased range of motion  Visit Diagnosis: Chronic pain of right knee     Problem List Patient Active Problem List   Diagnosis Date Noted  . Acute respiratory failure with hypoxia ([place]) 09/16/2019  . Osteoarthritis of spine with radiculopathy, lumbar region 01/08/2018  . Thyroid disease   . Aortic atherosclerosis ([place]) 05/02/2017  . Thrombocytopenia ([place]) 07/10/2016  . Hereditary and idiopathic peripheral neuropathy 10/16/2015  . Mononeuropathy 10/04/2015  . Allergic rhinitis 05/06/2014  . Depression 08/03/2013  . Sick sinus syndrome ([place]) 07/31/2012  . Hypertensive heart disease without CHF   . Chronic atrial fibrillation ([place])   . Coronary artery disease involving native coronary artery of native heart with  angina pectoris ([place])   . History of TIAs   . Lumbar disc disease   . GERD (gastroesophageal reflux disease)   . Chronic anticoagulation   . Benign prostatic  hyperplasia   . Hyperlipidemia     Amarachukwu Lakatos, [place]  MPT 04/04/2021, 11:56 AM  [facility] [place], [place], [address] Phone: [phone]   Fax:  [phone]  Name: Zai Chmiel MRN: [id number] Date of Birth: [birth year]

## 2021-04-06 ENCOUNTER — Encounter: Payer: Medicare Other | Admitting: Physical Therapy

## 2021-04-06 DIAGNOSIS — R601 Generalized edema: Secondary | ICD-10-CM | POA: Diagnosis not present

## 2021-04-06 DIAGNOSIS — M84371A Stress fracture, right ankle, initial encounter for fracture: Secondary | ICD-10-CM | POA: Diagnosis not present

## 2021-04-06 DIAGNOSIS — M170 Bilateral primary osteoarthritis of knee: Secondary | ICD-10-CM | POA: Diagnosis not present

## 2021-04-07 ENCOUNTER — Ambulatory Visit: Payer: Medicare Other | Admitting: *Deleted

## 2021-04-07 ENCOUNTER — Other Ambulatory Visit: Payer: Self-pay

## 2021-04-07 DIAGNOSIS — G8929 Other chronic pain: Secondary | ICD-10-CM

## 2021-04-07 DIAGNOSIS — M25561 Pain in right knee: Secondary | ICD-10-CM

## 2021-04-07 DIAGNOSIS — M6281 Muscle weakness (generalized): Secondary | ICD-10-CM | POA: Diagnosis not present

## 2021-04-07 NOTE — Therapy (Signed)
[facility] [place], [place], [address] Phone: [phone]   Fax:  [phone]  Physical Therapy Treatment  Patient Details  Name: Dalton Vaughan MRN: [id number] Date of Birth: [birth year] Referring Provider (PT): Fenton Foy PA-C.   Encounter Date: 04/07/2021   PT End of Session - 04/07/21 0951    Visit Number 9    Number of Visits 12    Date for PT Re-Evaluation 04/07/21    Authorization Type FOTO AT LEAST EVERY 5TH VISIT.  PROGRESS NOTE AT 10TH VISIT.  KX MODIFIER AFTER 15 VISITS.    PT Start Time 0945    PT Stop Time 1035    PT Time Calculation (min) 50 min           Past Medical History:  Diagnosis Date  . Anal fissure   . Arthritis   . Atrial fibrillation ([place])   . BPH (benign prostatic hypertrophy)   . CAD (coronary artery disease)    Dr. Wynonia Lawman  . Cataract   . COVID-19   . Depression 2004  . Diverticulosis   . GERD (gastroesophageal reflux disease)   . History of TIAs   . Hyperlipidemia   . Hypertension   . Hypertensive heart disease without CHF   . Hypothyroidism   . Internal hemorrhoids   . Lumbar disc disease   . Oral bleeding 08/20/2012   Following molar tooth extraction July, 2013  . Pneumonia   . Ruptured disk 1995  . Shingles   . Thyroid disease   . Vitamin D deficiency   . Vitreous detachment     Past Surgical History:  Procedure Laterality Date  . bilateral cataract surgery  6/07  . CARDIAC CATHETERIZATION    . CORONARY ARTERY BYPASS GRAFT  12/99    Dr. Servando Snare   . cysloscopy  8/08  . EYE SURGERY    . HERNIA REPAIR  1997   right  . herninated disc  1995  . PACEMAKER INSERTION  11/03/2020  . PROSTATE BIOPSY  8/08  . TONSILLECTOMY AND ADENOIDECTOMY      There were no vitals filed for this visit.   Subjective Assessment - 04/07/21 0948    Subjective COVID-19 screen performed prior to patient entering clinic.  That last treatment helped a lot.    Pertinent History  PACEMAKER, Atrial fib, lumbar disc disease, HTN, CHF, h/o neck pain.    How long can you walk comfortably? Around home and short community distances.    Patient Stated Goals Regain range of motion in knee and do more.    Currently in Pain? Yes    Pain Score 2     Pain Location Knee    Pain Orientation Right    Pain Descriptors / Indicators Discomfort    Pain Type Surgical pain    Pain Onset More than a month ago                             [facility] Adult PT Treatment/Exercise - 04/07/21 0001      Exercises   Exercises Knee/Hip      Knee/Hip Exercises: Aerobic   Nustep Level 4 x 15 minutes.      Modalities   Modalities Vasopneumatic      Vasopneumatic   Number Minutes Vasopneumatic  15 minutes    Vasopnuematic Location  Knee    Vasopneumatic Pressure Medium    Vasopneumatic Temperature  34/discomfort  Manual Therapy   Manual Therapy Soft tissue mobilization    Myofascial Release IASTW to R ITB,  HS and superior calf to reduce tone                       PT Long Term Goals - 03/23/21 1208      PT LONG TERM GOAL #1   Title Independent with a HEP.    Time 4    Period Weeks    Status On-going      PT LONG TERM GOAL #2   Title Active right knee flexion to 115-120 degrees+ so the patient can perform functional tasks and do so with pain not > 2-3/10.    Baseline AROM 105 03/16/21    Time 4    Period Weeks    Status Achieved      PT LONG TERM GOAL #3   Title Full active knee extension in order to normalize gait.    Baseline AROM -5 degrees 03/16/21    Time 4    Period Weeks    Status On-going      PT LONG TERM GOAL #4   Title Perform ADL's with pain not > 2-3/10.    Baseline Met with 2-3/10 03/16/21    Time 4    Period Weeks    Status Achieved                 Plan - 04/07/21 1227    Clinical Impression Statement Pt arrived today doing better overall with decreased pain RT LE. He reports f/u with MD yesterday and that he was  pleased with current status and wants Pt to finish out PT visits. Rx focused on tightness in RT HS as well as ITB and did well with STW/ IASTM to this area f/b Vaso. DC after 3 more visits    Personal Factors and Comorbidities Comorbidity 1;Comorbidity 2    Comorbidities PACEMAKER, Atrial fib, lumbar disc disease, HTN, CHF, h/o neck pain.    Examination-Activity Limitations Locomotion Level;Other    Examination-Participation Restrictions Other    Stability/Clinical Decision Making Evolving/Moderate complexity    Rehab Potential Good    PT Frequency 3x / week    PT Duration 4 weeks    PT Treatment/Interventions ADLs/Self Care Home Management;Cryotherapy;Moist Heat;Gait training;Stair training;Functional mobility training;Therapeutic activities;Therapeutic exercise;Neuromuscular re-education;Manual techniques;Patient/family education;Passive range of motion;Dry needling;Vasopneumatic Device    PT Next Visit Plan cont with POC for Right hamstring stretching and STW/M, Nustep, quadriceps strengthning.    Consulted and Agree with Plan of Care Patient           Patient will benefit from skilled therapeutic intervention in order to improve the following deficits and impairments:  Abnormal gait,Difficulty walking,Decreased activity tolerance,Decreased strength,Decreased range of motion  Visit Diagnosis: Chronic pain of right knee  Muscle weakness (generalized)     Problem List Patient Active Problem List   Diagnosis Date Noted  . Acute respiratory failure with hypoxia ([place]) 09/16/2019  . Osteoarthritis of spine with radiculopathy, lumbar region 01/08/2018  . Thyroid disease   . Aortic atherosclerosis ([place]) 05/02/2017  . Thrombocytopenia ([place]) 07/10/2016  . Hereditary and idiopathic peripheral neuropathy 10/16/2015  . Mononeuropathy 10/04/2015  . Allergic rhinitis 05/06/2014  . Depression 08/03/2013  . Sick sinus syndrome ([place]) 07/31/2012  . Hypertensive heart disease without CHF   .  Chronic atrial fibrillation ([place])   . Coronary artery disease involving native coronary artery of native heart with angina pectoris ([place])   .  History of TIAs   . Lumbar disc disease   . GERD (gastroesophageal reflux disease)   . Chronic anticoagulation   . Benign prostatic hyperplasia   . Hyperlipidemia     Charmaine Placido,CHRIS, PTA 04/07/2021, 12:31 PM  [facility] [place], [place], [address] Phone: [phone]   Fax:  [phone]  Name: Dalton Vaughan MRN: [id number] Date of Birth: 11-04-1929

## 2021-04-10 ENCOUNTER — Other Ambulatory Visit: Payer: Self-pay | Admitting: Family Medicine

## 2021-04-11 ENCOUNTER — Ambulatory Visit: Payer: Medicare Other | Admitting: Physical Therapy

## 2021-04-11 ENCOUNTER — Encounter: Payer: Self-pay | Admitting: Physical Therapy

## 2021-04-11 ENCOUNTER — Other Ambulatory Visit: Payer: Self-pay

## 2021-04-11 DIAGNOSIS — G8929 Other chronic pain: Secondary | ICD-10-CM | POA: Diagnosis not present

## 2021-04-11 DIAGNOSIS — M6281 Muscle weakness (generalized): Secondary | ICD-10-CM

## 2021-04-11 DIAGNOSIS — M25561 Pain in right knee: Secondary | ICD-10-CM | POA: Diagnosis not present

## 2021-04-11 NOTE — Therapy (Signed)
[facility] [place], [place], [address] Phone: [phone]   Fax:  [phone]  Physical Therapy Treatment  Patient Details  Name: Dalton Vaughan MRN: [id number] Date of Birth: 1929-06-11 Referring Provider (PT): Fenton Foy PA-C.   Encounter Date: 04/11/2021   PT End of Session - 04/11/21 1508    Visit Number 10    Number of Visits 12    Date for PT Re-Evaluation 04/07/21    Authorization Type FOTO AT LEAST EVERY 5TH VISIT.  PROGRESS NOTE AT 10TH VISIT.  KX MODIFIER AFTER 15 VISITS.    PT Start Time 1431    PT Stop Time 1516    PT Time Calculation (min) 45 min    Activity Tolerance Patient tolerated treatment well    Behavior During Therapy WFL for tasks assessed/performed           Past Medical History:  Diagnosis Date  . Anal fissure   . Arthritis   . Atrial fibrillation ([place])   . BPH (benign prostatic hypertrophy)   . CAD (coronary artery disease)    Dr. Wynonia Lawman  . Cataract   . COVID-19   . Depression 2004  . Diverticulosis   . GERD (gastroesophageal reflux disease)   . History of TIAs   . Hyperlipidemia   . Hypertension   . Hypertensive heart disease without CHF   . Hypothyroidism   . Internal hemorrhoids   . Lumbar disc disease   . Oral bleeding 08/20/2012   Following molar tooth extraction July, 2013  . Pneumonia   . Ruptured disk 1995  . Shingles   . Thyroid disease   . Vitamin D deficiency   . Vitreous detachment     Past Surgical History:  Procedure Laterality Date  . bilateral cataract surgery  6/07  . CARDIAC CATHETERIZATION    . CORONARY ARTERY BYPASS GRAFT  12/99    Dr. Servando Snare   . cysloscopy  8/08  . EYE SURGERY    . HERNIA REPAIR  1997   right  . herninated disc  1995  . PACEMAKER INSERTION  11/03/2020  . PROSTATE BIOPSY  8/08  . TONSILLECTOMY AND ADENOIDECTOMY      There were no vitals filed for this visit.   Subjective Assessment - 04/11/21 1431    Subjective COVID-19  screen performed prior to patient entering clinic.  That last treatment helped a lot. Still has some discomfort of HS and gastroc and limited with full R knee ROM.    Pertinent History PACEMAKER, Atrial fib, lumbar disc disease, HTN, CHF, h/o neck pain.    Currently in Pain? Yes    Pain Score 3     Pain Location Knee    Pain Orientation Right;Posterior    Pain Descriptors / Indicators Discomfort    Pain Type Surgical pain    Pain Onset More than a month ago    Pain Frequency Intermittent              [facility] PT Assessment - 04/11/21 0001      Assessment   Medical Diagnosis Right knee OA.    Referring Provider (PT) Fenton Foy PA-C.    Hand Dominance Right    Next MD Visit 03/2021      Precautions   Precaution Comments PACEMAKER.      Restrictions   Weight Bearing Restrictions No      ROM / Strength   AROM / PROM / Strength AROM      AROM  Overall AROM  Within functional limits for tasks performed    AROM Assessment Site Knee    Right/Left Knee Right    Right Knee Extension 0    Right Knee Flexion 118                         [facility] Adult PT Treatment/Exercise - 04/11/21 0001      Knee/Hip Exercises: Stretches   Active Hamstring Stretch Right;3 reps;30 seconds    Active Hamstring Stretch Limitations off 8" step      Knee/Hip Exercises: Aerobic   Nustep L4, seat 10 x15 min      Knee/Hip Exercises: Standing   Forward Lunges Right;2 sets;10 reps;3 seconds;Limitations    Forward Lunges Limitations off 8" step    Rocker Board 3 minutes      Modalities   Modalities Vasopneumatic      Vasopneumatic   Number Minutes Vasopneumatic  10 minutes    Vasopnuematic Location  Knee    Vasopneumatic Pressure Medium    Vasopneumatic Temperature  34/discomfort      Manual Therapy   Manual Therapy Myofascial release    Myofascial Release IASTW to R HS, superior gastroc in prone hang to reduce tone and increase ROM                       PT  Long Term Goals - 04/11/21 1512      PT LONG TERM GOAL #1   Title Independent with a HEP.    Time 4    Period Weeks    Status On-going      PT LONG TERM GOAL #2   Title Active right knee flexion to 115-120 degrees+ so the patient can perform functional tasks and do so with pain not > 2-3/10.    Baseline AROM 105 03/16/21    Time 4    Period Weeks    Status Achieved      PT LONG TERM GOAL #3   Title Full active knee extension in order to normalize gait.    Baseline AROM -5 degrees 03/16/21    Time 4    Period Weeks    Status Achieved      PT LONG TERM GOAL #4   Title Perform ADL's with pain not > 2-3/10.    Baseline Met with 2-3/10 03/16/21    Time 4    Period Weeks    Status Achieved                 Plan - 04/11/21 1523    Clinical Impression Statement Patient presented in clinic with reports of continued HS and gastroc tightness that limits ROM. Patient progressed through stretching and ROM activities to progress ROM. With forward lunges, patient felt as if tightess of HS was limiting lunge. Patient placed in prone hang during IASTW to R HS with moderate redness response of R medial HS. Small pocket of inflammation noted superior to R popliteal fossa that could not be palpated in L knee. Patient's R knee AROM measured as 0-118 deg following manual therapy session. Normal vasopneumatic response noted following removal of the modality. Patient educated that soreness may present after the IASTW session.    Personal Factors and Comorbidities Comorbidity 1;Comorbidity 2    Comorbidities PACEMAKER, Atrial fib, lumbar disc disease, HTN, CHF, h/o neck pain.    Examination-Activity Limitations Locomotion Level;Other    Examination-Participation Restrictions Other    Stability/Clinical Decision Making  Evolving/Moderate complexity    Rehab Potential Good    PT Frequency 3x / week    PT Duration 4 weeks    PT Treatment/Interventions ADLs/Self Care Home Management;Cryotherapy;Moist  Heat;Gait training;Stair training;Functional mobility training;Therapeutic activities;Therapeutic exercise;Neuromuscular re-education;Manual techniques;Patient/family education;Passive range of motion;Dry needling;Vasopneumatic Device    PT Next Visit Plan cont with POC for Right hamstring stretching and STW/M, Nustep, quadriceps strengthning.    Consulted and Agree with Plan of Care Patient           Patient will benefit from skilled therapeutic intervention in order to improve the following deficits and impairments:  Abnormal gait,Difficulty walking,Decreased activity tolerance,Decreased strength,Decreased range of motion  Visit Diagnosis: Chronic pain of right knee  Muscle weakness (generalized)     Problem List Patient Active Problem List   Diagnosis Date Noted  . Acute respiratory failure with hypoxia ([place]) 09/16/2019  . Osteoarthritis of spine with radiculopathy, lumbar region 01/08/2018  . Thyroid disease   . Aortic atherosclerosis ([place]) 05/02/2017  . Thrombocytopenia ([place]) 07/10/2016  . Hereditary and idiopathic peripheral neuropathy 10/16/2015  . Mononeuropathy 10/04/2015  . Allergic rhinitis 05/06/2014  . Depression 08/03/2013  . Sick sinus syndrome ([place]) 07/31/2012  . Hypertensive heart disease without CHF   . Chronic atrial fibrillation ([place])   . Coronary artery disease involving native coronary artery of native heart with angina pectoris ([place])   . History of TIAs   . Lumbar disc disease   . GERD (gastroesophageal reflux disease)   . Chronic anticoagulation   . Benign prostatic hyperplasia   . Hyperlipidemia     Standley Brooking, PTA 04/11/2021, 3:27 PM  [facility] [address] [place], [place], [address] Phone: [phone]   Fax:  [phone]  Name: Dalton Vaughan MRN: [id number] Date of Birth: 03-13-1929  Progress Note Reporting Period 02/01/21 to 04/11/21  See note below for Objective Data and Assessment of  Progress/Goals. Excellent progress with LTG's #2, 3 and 4 met.  Patient has two visits remaining.    Mali Applegate MPT

## 2021-04-14 ENCOUNTER — Other Ambulatory Visit: Payer: Self-pay

## 2021-04-14 ENCOUNTER — Ambulatory Visit: Payer: Medicare Other | Admitting: *Deleted

## 2021-04-14 DIAGNOSIS — G8929 Other chronic pain: Secondary | ICD-10-CM

## 2021-04-14 DIAGNOSIS — M6281 Muscle weakness (generalized): Secondary | ICD-10-CM

## 2021-04-14 DIAGNOSIS — M25561 Pain in right knee: Secondary | ICD-10-CM

## 2021-04-14 NOTE — Therapy (Signed)
[facility] [place], [place], [address] Phone: [phone]   Fax:  [phone]  Physical Therapy Treatment  Patient Details  Name: Dalton Vaughan MRN: [id number] Date of Birth: 1929-03-29 Referring Provider (PT): Fenton Foy PA-C.   Encounter Date: 04/14/2021   PT End of Session - 04/14/21 0956    Visit Number 11    Number of Visits 12    Date for PT Re-Evaluation 04/07/21    Authorization Type FOTO AT LEAST EVERY 5TH VISIT.  PROGRESS NOTE AT 10TH VISIT.  KX MODIFIER AFTER 15 VISITS.    PT Start Time 0945    Activity Tolerance Patient tolerated treatment well    Behavior During Therapy [facility] for tasks assessed/performed           Past Medical History:  Diagnosis Date  . Anal fissure   . Arthritis   . Atrial fibrillation ([place])   . BPH (benign prostatic hypertrophy)   . CAD (coronary artery disease)    Dr. Wynonia Lawman  . Cataract   . COVID-19   . Depression 2004  . Diverticulosis   . GERD (gastroesophageal reflux disease)   . History of TIAs   . Hyperlipidemia   . Hypertension   . Hypertensive heart disease without CHF   . Hypothyroidism   . Internal hemorrhoids   . Lumbar disc disease   . Oral bleeding 08/20/2012   Following molar tooth extraction July, 2013  . Pneumonia   . Ruptured disk 1995  . Shingles   . Thyroid disease   . Vitamin D deficiency   . Vitreous detachment     Past Surgical History:  Procedure Laterality Date  . bilateral cataract surgery  6/07  . CARDIAC CATHETERIZATION    . CORONARY ARTERY BYPASS GRAFT  12/99    Dr. Servando Snare   . cysloscopy  8/08  . EYE SURGERY    . HERNIA REPAIR  1997   right  . herninated disc  1995  . PACEMAKER INSERTION  11/03/2020  . PROSTATE BIOPSY  8/08  . TONSILLECTOMY AND ADENOIDECTOMY      There were no vitals filed for this visit.                      [facility] Adult PT Treatment/Exercise - 04/14/21 0001      Knee/Hip Exercises:  Aerobic   Nustep L4, seat 10 x15 min      Knee/Hip Exercises: Standing   Heel Raises Both;20 reps    Knee Flexion AROM;Right;20 reps      Modalities   Modalities Vasopneumatic      Vasopneumatic   Number Minutes Vasopneumatic  15 minutes    Vasopnuematic Location  Knee    Vasopneumatic Pressure Medium    Vasopneumatic Temperature  34/discomfort      Manual Therapy   Manual Therapy Myofascial release    Myofascial Release IASTW to R HS, superior gastroc in prone hang to reduce tone and increase ROM, DF/PF also during calf IASTM                       PT Long Term Goals - 04/11/21 1512      PT LONG TERM GOAL #1   Title Independent with a HEP.    Time 4    Period Weeks    Status On-going      PT LONG TERM GOAL #2   Title Active right knee flexion to 115-120 degrees+ so  the patient can perform functional tasks and do so with pain not > 2-3/10.    Baseline AROM 105 03/16/21    Time 4    Period Weeks    Status Achieved      PT LONG TERM GOAL #3   Title Full active knee extension in order to normalize gait.    Baseline AROM -5 degrees 03/16/21    Time 4    Period Weeks    Status Achieved      PT LONG TERM GOAL #4   Title Perform ADL's with pain not > 2-3/10.    Baseline Met with 2-3/10 03/16/21    Time 4    Period Weeks    Status Achieved                 Plan - 04/14/21 8032    Clinical Impression Statement Pt arrived today doing fairly well and feels PT is helping and wants to come 1 more time. Rx focused on Rt LE posterior aspect HS and calf. Nustep performed f/b IASTW to RT HS group as well as calf while on/off stretch. HS curl and calf raises performed after STW and then Vaso end of session.    Comorbidities PACEMAKER, Atrial fib, lumbar disc disease, HTN, CHF, h/o neck pain.    Examination-Activity Limitations Locomotion Level;Other    Examination-Participation Restrictions Other    Stability/Clinical Decision Making Evolving/Moderate complexity     Rehab Potential Good    PT Frequency 3x / week    PT Duration 4 weeks    PT Treatment/Interventions ADLs/Self Care Home Management;Cryotherapy;Moist Heat;Gait training;Stair training;Functional mobility training;Therapeutic activities;Therapeutic exercise;Neuromuscular re-education;Manual techniques;Patient/family education;Passive range of motion;Dry needling;Vasopneumatic Device    PT Next Visit Plan DC to HEP after next visit    Consulted and Agree with Plan of Care Patient           Patient will benefit from skilled therapeutic intervention in order to improve the following deficits and impairments:  Abnormal gait,Difficulty walking,Decreased activity tolerance,Decreased strength,Decreased range of motion  Visit Diagnosis: Chronic pain of right knee  Muscle weakness (generalized)     Problem List Patient Active Problem List   Diagnosis Date Noted  . Acute respiratory failure with hypoxia ([place]) 09/16/2019  . Osteoarthritis of spine with radiculopathy, lumbar region 01/08/2018  . Thyroid disease   . Aortic atherosclerosis ([place]) 05/02/2017  . Thrombocytopenia ([place]) 07/10/2016  . Hereditary and idiopathic peripheral neuropathy 10/16/2015  . Mononeuropathy 10/04/2015  . Allergic rhinitis 05/06/2014  . Depression 08/03/2013  . Sick sinus syndrome ([place]) 07/31/2012  . Hypertensive heart disease without CHF   . Chronic atrial fibrillation ([place])   . Coronary artery disease involving native coronary artery of native heart with angina pectoris ([place])   . History of TIAs   . Lumbar disc disease   . GERD (gastroesophageal reflux disease)   . Chronic anticoagulation   . Benign prostatic hyperplasia   . Hyperlipidemia     Zyiere Rosemond,CHRIS,  PTA 04/14/2021, 10:41 AM  [facility] [place], [place], [address] Phone: [phone]   Fax:  [phone]  Name: Dalton Vaughan MRN: [id number] Date of Birth: 1929-01-11

## 2021-04-18 ENCOUNTER — Other Ambulatory Visit: Payer: Self-pay

## 2021-04-18 ENCOUNTER — Encounter: Payer: Self-pay | Admitting: Internal Medicine

## 2021-04-18 ENCOUNTER — Encounter: Payer: Self-pay | Admitting: Physical Therapy

## 2021-04-18 ENCOUNTER — Ambulatory Visit (INDEPENDENT_AMBULATORY_CARE_PROVIDER_SITE_OTHER): Payer: Medicare Other | Admitting: Internal Medicine

## 2021-04-18 ENCOUNTER — Ambulatory Visit: Payer: Medicare Other | Attending: Orthopedic Surgery | Admitting: Physical Therapy

## 2021-04-18 VITALS — BP 110/70 | HR 72 | Ht 72.0 in | Wt 167.0 lb

## 2021-04-18 DIAGNOSIS — K224 Dyskinesia of esophagus: Secondary | ICD-10-CM | POA: Diagnosis not present

## 2021-04-18 DIAGNOSIS — K649 Unspecified hemorrhoids: Secondary | ICD-10-CM | POA: Diagnosis not present

## 2021-04-18 DIAGNOSIS — M6281 Muscle weakness (generalized): Secondary | ICD-10-CM | POA: Diagnosis not present

## 2021-04-18 DIAGNOSIS — M25561 Pain in right knee: Secondary | ICD-10-CM | POA: Insufficient documentation

## 2021-04-18 DIAGNOSIS — G8929 Other chronic pain: Secondary | ICD-10-CM | POA: Diagnosis not present

## 2021-04-18 NOTE — Patient Instructions (Addendum)
Please purchase the following medications over the counter and take as directed: Preparation H suppositories- x3-5 nights as needed.  Follow a high fiber diet.  Follow up as needed.  High-Fiber Eating Plan Fiber, also called dietary fiber, is a type of carbohydrate. It is found foods such as fruits, vegetables, whole grains, and beans. A high-fiber diet can have many health benefits. Your health care provider may recommend a high-fiber diet to help:  Prevent constipation. Fiber can make your bowel movements more regular.  Lower your cholesterol.  Relieve the following conditions: ? Inflammation of veins in the anus (hemorrhoids). ? Inflammation of specific areas of the digestive tract (uncomplicated diverticulosis). ? A problem of the large intestine, also called the colon, that sometimes causes pain and diarrhea (irritable bowel syndrome, or IBS).  Prevent overeating as part of a weight-loss plan.  Prevent heart disease, type 2 diabetes, and certain cancers. What are tips for following this plan? Reading food labels  Check the nutrition facts label on food products for the amount of dietary fiber. Choose foods that have 5 grams of fiber or more per serving.  The goals for recommended daily fiber intake include: ? Men (age 2 or younger): 34-38 g. ? Men (over age 98): 28-34 g. ? Women (age 31 or younger): 25-28 g. ? Women (over age 64): 22-25 g. Your daily fiber goal is _____________ g.   Shopping  Choose whole fruits and vegetables instead of processed forms, such as apple juice or applesauce.  Choose a wide variety of high-fiber foods such as avocados, lentils, oats, and kidney beans.  Read the nutrition facts label of the foods you choose. Be aware of foods with added fiber. These foods often have high sugar and sodium amounts per serving. Cooking  Use whole-grain flour for baking and cooking.  Cook with brown rice instead of white rice. Meal planning  Start the day  with a breakfast that is high in fiber, such as a cereal that contains 5 g of fiber or more per serving.  Eat breads and cereals that are made with whole-grain flour instead of refined flour or white flour.  Eat brown rice, bulgur wheat, or millet instead of white rice.  Use beans in place of meat in soups, salads, and pasta dishes.  Be sure that half of the grains you eat each day are whole grains. General information  You can get the recommended daily intake of dietary fiber by: ? Eating a variety of fruits, vegetables, grains, nuts, and beans. ? Taking a fiber supplement if you are not able to take in enough fiber in your diet. It is better to get fiber through food than from a supplement.  Gradually increase how much fiber you consume. If you increase your intake of dietary fiber too quickly, you may have bloating, cramping, or gas.  Drink plenty of water to help you digest fiber.  Choose high-fiber snacks, such as berries, raw vegetables, nuts, and popcorn. What foods should I eat? Fruits Berries. Pears. Apples. Oranges. Avocado. Prunes and raisins. Dried figs. Vegetables Sweet potatoes. Spinach. Kale. Artichokes. Cabbage. Broccoli. Cauliflower. Green peas. Carrots. Squash. Grains Whole-grain breads. Multigrain cereal. Oats and oatmeal. Brown rice. Barley. Bulgur wheat. [place]. Quinoa. Bran muffins. Popcorn. Rye wafer crackers. Meats and other proteins Navy beans, kidney beans, and pinto beans. Soybeans. Split peas. Lentils. Nuts and seeds. Dairy Fiber-fortified yogurt. Beverages Fiber-fortified soy milk. Fiber-fortified orange juice. Other foods Fiber bars. The items listed above may not be a complete  list of recommended foods and beverages. Contact a dietitian for more information. What foods should I avoid? Fruits Fruit juice. Cooked, strained fruit. Vegetables Fried potatoes. Canned vegetables. Well-cooked vegetables. Grains White bread. Pasta made with refined  flour. White rice. Meats and other proteins Fatty cuts of meat. Fried chicken or fried fish. Dairy Milk. Yogurt. Cream cheese. Sour cream. Fats and oils Butters. Beverages Soft drinks. Other foods Cakes and pastries. The items listed above may not be a complete list of foods and beverages to avoid. Talk with your dietitian about what choices are best for you. Summary  Fiber is a type of carbohydrate. It is found in foods such as fruits, vegetables, whole grains, and beans.  A high-fiber diet has many benefits. It can help to prevent constipation, lower blood cholesterol, aid weight loss, and reduce your risk of heart disease, diabetes, and certain cancers.  Increase your intake of fiber gradually. Increasing fiber too quickly may cause cramping, bloating, and gas. Drink plenty of water while you increase the amount of fiber you consume.  The best sources of fiber include whole fruits and vegetables, whole grains, nuts, seeds, and beans. This information is not intended to replace advice given to you by your health care provider. Make sure you discuss any questions you have with your health care provider. Document Revised: 04/07/2020 Document Reviewed: 04/07/2020 Elsevier Patient Education  2021 [employer].

## 2021-04-18 NOTE — Progress Notes (Signed)
Subjective:    Patient ID: Dalton Vaughan, male    DOB: [birth year], 87 y.o.   MRN: [id number]  HPI Dalton Vaughan is an 87 year old male with a past medical history of colonic diverticulosis, internal hemorrhoids, esophageal dysmotility and prior issues with dysphagia and abdominal bloating who is seen for follow-up.  He was last seen in September 2021 by Carl Best, NP.  He is here alone today.  He also has a history of CAD with prior CABG, atrial fibrillation on warfarin, hypothyroidism, hypertension, hyperlipidemia, BPH and COVID-19 requiring hospitalization in September 2020.  He had a pacemaker placed since being seen here last as well.  This was performed for sinus pauses up to 4 seconds.  He reports that most of his abdominal symptoms have completely resolved with pacemaker placement.  He states that this pacemaker was placed uneventfully and set at a rate of around 70 bpm.  He has not had further issues with dysphagia symptom.  His abdominal bloating also improved.  He has had more regular bowel movements and he is eating high-fiber foods such as oatmeal and apples.  This helps to keep his stool soft.  There will be very rare occasion while eating that he will feel like food does not transit easily into his stomach and he simply waits for the food to pass on its own.  This may happen once every 6 weeks.  Appetite has been good.  No nausea or vomiting.  No blood in stool or melena.  He does have issues feeling hemorrhoidal swelling at times.  We recommended a barium esophagram with tablet which was performed on 09/30/2020.  This showed no mass or stricture.  Some hold-up of contrast was seen when he was in the supine position at the level of the mid esophagus clearing with additional swallows of water.  There is no hiatal hernia and no evidence of spontaneous GERD.  Barium tablet passed promptly without delay.   Review of Systems As per HPI, otherwise negative  Current Medications,  Allergies, Past Medical History, Past Surgical History, Family History and Social History were reviewed in [employer] record.     Objective:   Physical Exam BP 110/70   Pulse 72   Ht 6' (1.829 m)   Wt 167 lb (75.8 kg)   BMI 22.65 kg/m  Gen: awake, alert, NAD HEENT: anicteric CV: RRR, no mrg Pulm: CTA b/l Abd: soft, NT/ND, +BS throughout Rectal: No external masses or lesions, formed soft stool in the rectal vault, no masses, internal hemorrhoids without tenderness, stool heme-negative Ext: no c/c/e Neuro: nonfocal     Assessment & Plan:  87 year old male with a past medical history of colonic diverticulosis, internal hemorrhoids, esophageal dysmotility and prior issues with dysphagia and abdominal bloating who is seen for follow-up.   1.  Esophageal dysmotility --very rare esophageal dysphagia which is likely dysmotility related.  His barium esophagram was very reassuring.  Given the very intermittent and rare issues he is having with swallowing I do not recommend upper endoscopy at this time.  He is in agreement.  He is not having heartburn and thus acid reduction therapy not recommended.  2.  Symptomatic hemorrhoids --he does have internal hemorrhoids which are evident on rectal exam today along with having been seen at flexible sigmoidoscopy performed in April 2016.  I recommended he continue his high-fiber diet.  He can use over-the-counter Preparation H suppositories nightly for 3-5 nights if hemorrhoids are particularly bothersome.  If his  hemorrhoids continue to trouble him we could consider hemorrhoidal banding.  We discussed this some today.  He will let me know should this be the case  3.  Atrial arrhythmia and sinus pause --rate now controlled after pacemaker placement.  He continues warfarin for cardiovascular protection in the setting of intermittent atrial fibrillation.  He follows with cardiology.  Return as needed  20 minutes total spent today  including patient facing time, coordination of care, reviewing medical history/procedures/pertinent radiology studies, and documentation of the encounter.

## 2021-04-18 NOTE — Therapy (Addendum)
[facility] [place], [place], [address] Phone: [phone]   Fax:  [phone]  Physical Therapy Treatment  Patient Details  Name: Dalton Vaughan MRN: [id number] Date of Birth: 1929-10-15 Referring Provider (PT): Fenton Foy PA-C.   Encounter Date: 04/18/2021   PT End of Session - 04/18/21 1002    Visit Number 12    Number of Visits 12    Date for PT Re-Evaluation 04/07/21    Authorization Type FOTO AT LEAST EVERY 5TH VISIT.  PROGRESS NOTE AT 10TH VISIT.  KX MODIFIER AFTER 15 VISITS.    PT Start Time 0945    PT Stop Time 1032    PT Time Calculation (min) 47 min    Activity Tolerance Patient tolerated treatment well    Behavior During Therapy [facility] for tasks assessed/performed           Past Medical History:  Diagnosis Date  . Anal fissure   . Arthritis   . Atrial fibrillation ([place])   . BPH (benign prostatic hypertrophy)   . CAD (coronary artery disease)    Dr. Wynonia Lawman  . Cataract   . COVID-19   . Depression 2004  . Diverticulosis   . GERD (gastroesophageal reflux disease)   . History of TIAs   . Hyperlipidemia   . Hypertension   . Hypertensive heart disease without CHF   . Hypothyroidism   . Internal hemorrhoids   . Lumbar disc disease   . Oral bleeding 08/20/2012   Following molar tooth extraction July, 2013  . Pneumonia   . Ruptured disk 1995  . Shingles   . Thyroid disease   . Vitamin D deficiency   . Vitreous detachment     Past Surgical History:  Procedure Laterality Date  . bilateral cataract surgery  6/07  . CARDIAC CATHETERIZATION    . CORONARY ARTERY BYPASS GRAFT  12/99    Dr. Servando Snare   . cysloscopy  8/08  . EYE SURGERY    . HERNIA REPAIR  1997   right  . herninated disc  1995  . PACEMAKER INSERTION  11/03/2020  . PROSTATE BIOPSY  8/08  . TONSILLECTOMY AND ADENOIDECTOMY      There were no vitals filed for this visit.   Subjective Assessment - 04/18/21 1001    Subjective COVID-19  screen performed prior to patient entering clinic.  Reports pain better some days than others.    Pertinent History PACEMAKER, Atrial fib, lumbar disc disease, HTN, CHF, h/o neck pain.    How long can you walk comfortably? Around home and short community distances.    Patient Stated Goals Regain range of motion in knee and do more.    Currently in Pain? Yes    Pain Score 2     Pain Location Knee    Pain Orientation Right;Posterior    Pain Descriptors / Indicators Discomfort    Pain Type Surgical pain    Pain Onset More than a month ago    Pain Frequency Intermittent              [facility] PT Assessment - 04/18/21 0001      Assessment   Medical Diagnosis Right knee OA.    Referring Provider (PT) Fenton Foy PA-C.    Hand Dominance Right      Precautions   Precaution Comments PACEMAKER.      Restrictions   Weight Bearing Restrictions No  [facility] Adult PT Treatment/Exercise - 04/18/21 0001      Knee/Hip Exercises: Aerobic   Nustep L4, seat 10 x15 min      Knee/Hip Exercises: Standing   Heel Raises Both;20 reps    Knee Flexion Strengthening;Right;2 sets;10 reps;Limitations    Knee Flexion Limitations 3#    Hip Extension Stengthening;Right;2 sets;10 reps    Extension Limitations red theraband    Rocker Board 3 minutes      Modalities   Modalities Vasopneumatic      Vasopneumatic   Number Minutes Vasopneumatic  10 minutes    Vasopnuematic Location  Knee    Vasopneumatic Pressure Medium    Vasopneumatic Temperature  34/discomfort      Manual Therapy   Manual Therapy Myofascial release    Myofascial Release Deep STW/ IASTW to R HS proximal, proximal calf to reduce tone                       PT Long Term Goals - 04/18/21 1108      PT LONG TERM GOAL #1   Title Independent with a HEP.    Time 4    Period Weeks    Status Achieved      PT LONG TERM GOAL #2   Title Active right knee flexion to 115-120 degrees+ so  the patient can perform functional tasks and do so with pain not > 2-3/10.    Baseline AROM 105 03/16/21    Time 4    Period Weeks    Status Achieved      PT LONG TERM GOAL #3   Title Full active knee extension in order to normalize gait.    Baseline AROM -5 degrees 03/16/21    Time 4    Period Weeks    Status Achieved      PT LONG TERM GOAL #4   Title Perform ADL's with pain not > 2-3/10.    Baseline Met with 2-3/10 03/16/21    Time 4    Period Weeks    Status Achieved                 Plan - 04/18/21 1108    Clinical Impression Statement Patient presented in clinic with reports of intermittant HS pain and tightness. Patient progressed through stretching and strengthening of HS and hip extensors. Increased tone palpable from R HS proximal attachment to mid HS but relaxed quickly with direct pressure and massage. Very minimal response to IASTW to superior gastroc region. Patient educated regarding self massage techniques using tennis ball for HS flare ups. Normal vasopnuematic response noted following removal of the modality.    Personal Factors and Comorbidities Comorbidity 1;Comorbidity 2    Comorbidities PACEMAKER, Atrial fib, lumbar disc disease, HTN, CHF, h/o neck pain.    Examination-Activity Limitations Locomotion Level;Other    Examination-Participation Restrictions Other    Stability/Clinical Decision Making Evolving/Moderate complexity    Rehab Potential Good    PT Frequency 3x / week    PT Duration 4 weeks    PT Treatment/Interventions ADLs/Self Care Home Management;Cryotherapy;Moist Heat;Gait training;Stair training;Functional mobility training;Therapeutic activities;Therapeutic exercise;Neuromuscular re-education;Manual techniques;Patient/family education;Passive range of motion;Dry needling;Vasopneumatic Device    PT Next Visit Plan D/C summary.    Consulted and Agree with Plan of Care Patient           Patient will benefit from skilled therapeutic  intervention in order to improve the following deficits and impairments:  Abnormal gait,Difficulty walking,Decreased activity tolerance,Decreased strength,Decreased range  of motion  Visit Diagnosis: Chronic pain of right knee  Muscle weakness (generalized)     Problem List Patient Active Problem List   Diagnosis Date Noted  . Acute respiratory failure with hypoxia ([place]) 09/16/2019  . Osteoarthritis of spine with radiculopathy, lumbar region 01/08/2018  . Thyroid disease   . Aortic atherosclerosis ([place]) 05/02/2017  . Thrombocytopenia ([place]) 07/10/2016  . Hereditary and idiopathic peripheral neuropathy 10/16/2015  . Mononeuropathy 10/04/2015  . Allergic rhinitis 05/06/2014  . Depression 08/03/2013  . Sick sinus syndrome ([place]) 07/31/2012  . Hypertensive heart disease without CHF   . Chronic atrial fibrillation ([place])   . Coronary artery disease involving native coronary artery of native heart with angina pectoris ([place])   . History of TIAs   . Lumbar disc disease   . GERD (gastroesophageal reflux disease)   . Chronic anticoagulation   . Benign prostatic hyperplasia   . Hyperlipidemia     Standley Brooking, PTA 04/18/21 11:15 AM   [facility] [address] [place], [place], [address] Phone: [phone]   Fax:  [phone]  Name: Dalton Vaughan MRN: [id number] Date of Birth: January 26, 1929  PHYSICAL THERAPY DISCHARGE SUMMARY  Visits from Start of Care: 12.  Current functional level related to goals / functional outcomes: See above.    Remaining deficits: All goals met.   Education / Equipment: HEP.  Plan: Patient agrees to discharge.  Patient goals were met. Patient is being discharged due to meeting the stated rehab goals.  ?????          Mali Applegate MPT

## 2021-04-20 DIAGNOSIS — I495 Sick sinus syndrome: Secondary | ICD-10-CM | POA: Diagnosis not present

## 2021-04-20 DIAGNOSIS — I4811 Longstanding persistent atrial fibrillation: Secondary | ICD-10-CM | POA: Diagnosis not present

## 2021-04-20 DIAGNOSIS — I4891 Unspecified atrial fibrillation: Secondary | ICD-10-CM | POA: Diagnosis not present

## 2021-04-20 DIAGNOSIS — R001 Bradycardia, unspecified: Secondary | ICD-10-CM | POA: Diagnosis not present

## 2021-04-20 DIAGNOSIS — Z951 Presence of aortocoronary bypass graft: Secondary | ICD-10-CM | POA: Diagnosis not present

## 2021-04-20 DIAGNOSIS — Z950 Presence of cardiac pacemaker: Secondary | ICD-10-CM | POA: Diagnosis not present

## 2021-04-20 DIAGNOSIS — I2510 Atherosclerotic heart disease of native coronary artery without angina pectoris: Secondary | ICD-10-CM | POA: Diagnosis not present

## 2021-04-24 ENCOUNTER — Ambulatory Visit (INDEPENDENT_AMBULATORY_CARE_PROVIDER_SITE_OTHER): Payer: Medicare Other | Admitting: Family Medicine

## 2021-04-24 ENCOUNTER — Encounter: Payer: Self-pay | Admitting: Family Medicine

## 2021-04-24 ENCOUNTER — Other Ambulatory Visit: Payer: Self-pay

## 2021-04-24 VITALS — BP 139/84 | HR 89 | Temp 97.9°F | Ht 72.0 in | Wt 164.8 lb

## 2021-04-24 DIAGNOSIS — R42 Dizziness and giddiness: Secondary | ICD-10-CM | POA: Diagnosis not present

## 2021-04-24 DIAGNOSIS — S1086XA Insect bite of other specified part of neck, initial encounter: Secondary | ICD-10-CM | POA: Diagnosis not present

## 2021-04-24 DIAGNOSIS — Z7901 Long term (current) use of anticoagulants: Secondary | ICD-10-CM | POA: Diagnosis not present

## 2021-04-24 DIAGNOSIS — W57XXXA Bitten or stung by nonvenomous insect and other nonvenomous arthropods, initial encounter: Secondary | ICD-10-CM | POA: Diagnosis not present

## 2021-04-24 DIAGNOSIS — I482 Chronic atrial fibrillation, unspecified: Secondary | ICD-10-CM

## 2021-04-24 DIAGNOSIS — Z9889 Other specified postprocedural states: Secondary | ICD-10-CM

## 2021-04-24 DIAGNOSIS — I700 Atherosclerosis of aorta: Secondary | ICD-10-CM | POA: Diagnosis not present

## 2021-04-24 DIAGNOSIS — R310 Gross hematuria: Secondary | ICD-10-CM

## 2021-04-24 LAB — COAGUCHEK XS/INR WAIVED
INR: 1.5 — ABNORMAL HIGH (ref 0.9–1.1)
Prothrombin Time: 18.3 s

## 2021-04-24 MED ORDER — RAMIPRIL 10 MG PO CAPS: 10.0000 mg | ORAL_CAPSULE | Freq: Every day | ORAL | 3 refills | Status: DC

## 2021-04-24 MED ORDER — DOXYCYCLINE HYCLATE 100 MG PO TABS: ORAL_TABLET | ORAL | 0 refills | Status: DC

## 2021-04-24 NOTE — Progress Notes (Addendum)
Subjective: CC: AFib PCP: [name], DO HPI:Dalton Vaughan is a 87 y.o. male presenting to clinic today for:  1. Afib Recently seen by Cardiologist, Dr Elonda Husky with [facility] in [place].  Has pacemaker in place.  He notes compliance with Coumadin 1/2 tablet MWF and 1 tablet daily all other days.  He reports intermittent gross hematuria, which is why he resumed above dosing.  In the past, when he was under the care of Dr Wynonia Lawman and Dr Laurance Flatten, his goal INR was 1.5 due to hematuria.  He's had workup with urology and hematuria was thought to be secondary to anticoagulation, straining with constipation, and BPH.  NO evidence of malignancy noted on recent studies.  Last OV with [facility] was in 02/2021.  He had evaluation with GI recently, who noted internal hemorrhoids.  Soft BMs were recommended.  He denies GI bleed.  Has h/o sick sinus syndrome but no h/o valve replacement.  2. Tick bite Patient reports recent tick bite to left side of neck.  He used to be prescribed Doxycycline for prophylaxis against lyme.  Has h/o RMSF in past.  No current myalgia, arthralgia, headache, new onset fatigue or rash.  Area of bite itches.  Has some redness but no bullseye appearance to rash.  Tick attached for unknown amount of time.  Removed it less than 48 hours ago.  3. Vertigo Patient with history of right sided ear surgery >15 years ago in [place].  He had vertigo in the last week such that he did call EMS and was evaluated in the hospital at [facility].  He had a negative work-up there and was told it was simply vertigo.  Symptoms have gradually gotten better.  He had a CT scan there that was reportedly negative.  He continues to have a fullness in the right ear and would like to be evaluated by ENT again  ROS: Per HPI  Allergies  Allergen Reactions  . Paxil [Paroxetine Hcl] Palpitations  . Eliquis [Apixaban] Other (See Comments)    dizziness  . Penicillins Other (See Comments)    Did it involve swelling of the  face/tongue/throat, SOB, or low BP? Unknown Did it involve sudden or severe rash/hives, skin peeling, or any reaction on the inside of your mouth or nose? Unknown Did you need to seek medical attention at a hospital or doctor's office? Unknown When did it last happen?unk If all above answers are "NO", may proceed with cephalosporin use.   Christy Sartorius Sodium] Other (See Comments)    myalgias  . Zetia [Ezetimibe] Other (See Comments)    myalgias  . Vytorin [Ezetimibe-Simvastatin] Other (See Comments)    myalgias   Past Medical History:  Diagnosis Date  . Anal fissure   . Arthritis   . Atrial fibrillation ([place])   . BPH (benign prostatic hypertrophy)   . CAD (coronary artery disease)    Dr. Wynonia Lawman  . Cataract   . COVID-19   . Depression 2004  . Diverticulosis   . GERD (gastroesophageal reflux disease)   . History of TIAs   . Hyperlipidemia   . Hypertension   . Hypertensive heart disease without CHF   . Hypothyroidism   . Internal hemorrhoids   . Lumbar disc disease   . Oral bleeding 08/20/2012   Following molar tooth extraction July, 2013  . Pneumonia   . Ruptured disk 1995  . Shingles   . Thyroid disease   . Vitamin D deficiency   . Vitreous detachment  Current Outpatient Medications:  .  amLODipine (NORVASC) 5 MG tablet, Take by mouth., Disp: , Rfl:  .  Calcium Carb-Cholecalciferol (CALCIUM + D3) 600-200 MG-UNIT TABS, Take 1 tablet by mouth daily. , Disp: , Rfl:  .  Cholecalciferol (VITAMIN D-3) 5000 UNITS TABS, Take 1 tablet by mouth daily., Disp: , Rfl:  .  doxazosin (CARDURA) 8 MG tablet, Take 1 tablet (8 mg total) by mouth at bedtime., Disp: 30 tablet, Rfl: 0 .  mirtazapine (REMERON) 15 MG tablet, TAKE 1 TABLET AT BEDTIME, Disp: 90 tablet, Rfl: 3 .  Multiple Vitamin (MULTIVITAMIN) tablet, Take 1 tablet by mouth daily., Disp: , Rfl:  .  ramipril (ALTACE) 10 MG capsule, TAKE 1 CAPSULE DAILY, Disp: 90 capsule, Rfl: 1 .  SIMETHICONE PO, Take by  mouth as needed., Disp: , Rfl:  .  SYNTHROID 50 MCG tablet, TAKE 1 TABLET DAILY BEFORE BREAKFAST, Disp: 90 tablet, Rfl: 3 .  triamterene-hydrochlorothiazide (MAXZIDE-25) 37.5-25 MG tablet, Take 1 tablet by mouth daily., Disp: 90 tablet, Rfl: 3 .  vitamin C (ASCORBIC ACID) 500 MG tablet, Take 500 mg by mouth daily. , Disp: , Rfl:  .  warfarin (COUMADIN) 5 MG tablet, TAKE AS DIRECTED, Disp: 90 tablet, Rfl: 3 .  XIIDRA 5 % SOLN, Apply 1 drop to eye as needed. , Disp: , Rfl:  Social History   Socioeconomic History  . Marital status: Widowed    Spouse name: [name]   . Number of children: 1  . Years of education: 37  . Highest education level: Not on file  Occupational History  . Occupation: Retired    Comment: Social research officer, government x 27 years  . 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 Smoking status: Former Smoker    Packs/day: 1.00    Years: 18.00    Pack years: 18.00    Types: Cigarettes    Start date: 12/17/1948    Quit date: 12/17/1966    Years since quitting: 54.3  . Smokeless tobacco: Never Used  Vaping Use  . Vaping Use: Never used  Substance and Sexual Activity  . Alcohol use: Yes    Alcohol/week: 1.0 standard drink    Types: 1 Standard drinks or equivalent per week    Comment: rare  . Drug use: No  . Sexual activity: Not on file  Other Topics Concern  . Not on file  Social History Narrative   Married.  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 Dementia Mother   . Arthritis Mother   . Cancer Brother        prostate  . Asthma Sister    . Heart disease Paternal Aunt   . Heart disease Paternal Uncle   . Hypertension Maternal Grandmother   . CVA Maternal Grandmother   . CVA Paternal Grandmother   . Hepatitis C Son   . Arthritis Son   . Colon cancer Neg Hx   . Colon polyps Neg Hx   .  Kidney disease Neg Hx   . Esophageal cancer Neg Hx   . Gallbladder disease Neg Hx   . Diabetes Neg Hx     Objective: Office vital signs reviewed. BP 139/84   Pulse 89   Temp 97.9 F (36.6 C)   Ht 6' (1.829 m)   Wt 164 lb 12.8 oz (74.8 kg)   SpO2 99%   BMI 22.35 kg/m   Physical Examination:  General: Awake, alert, well nourished, No acute distress HEENT: Normal; TMs were normal bilaterally with normal reflex.  No external auditory masses appreciated Cardio: regular rate and rhythm, S1S2 heard, no murmurs appreciated Pulm: clear to auscultation bilaterally, no wheezes, rhonchi or rales; normal work of breathing on room air Skin: Blanching erythema noted along the left side of the neck.  There is an appreciable insect bite here.  No target lesions Neuro: Normal upper extremity and lower extremity cerebellar testing.  No focal neurologic deficits  Assessment/ Plan: 87 y.o. male   Chronic atrial fibrillation (HCC) - Plan: CANCELED: Protime-INR  Chronic anticoagulation - Plan: CoaguChek XS/INR Waived, CANCELED: Protime-INR  Vertigo - Plan: Ambulatory referral to ENT  History of ear surgery - Plan: Ambulatory referral to ENT  Gross hematuria  Tick bite of other part of neck, initial encounter - Plan: doxycycline (VIBRA-TABS) 100 MG tablet  Aortic atherosclerosis ([place]) - Plan: ramipril (ALTACE) 10 MG capsule   He is on rate and rhythm controlled.  His INR is subtherapeutic but I am unsure as to whether or not his INR goal should be reduced.  I attempted to reach out to his cardiologist today unfortunately I could not get in touch with a live person and instead left a voicemail asking them to call back and give  recommendations.  Repeat INR testing is pending their recommendations.  If INR goal of 1.5-2 is acceptable we will plan to see him back in about 6 weeks, otherwise would like to adjust medicines accordingly versus discontinue pending their recommendation.  Referral to ENT placed.  There were no abnormalities appreciated within the external auditory canals nor the TMs.  Tick bite does not appear to be infected.  He does have a localized allergic reaction.  Prophylactic doxycycline prescribed.  Caution interaction with Coumadin  Altace renewed.  No orders of the defined types were placed in this encounter.  No orders of the defined types were placed in this encounter.   **Dr. Elonda Husky contacted me today, 04/25/2021 and informed that goal should be 2 or above as 1.5-2 INR would not be helpful in this patient with non valvular.  He did advise consideration for Eliquis versus Xarelto over warfarin and felt that his bleeding risk would be less.  I discussed this with the patient and he would like to simply adjust Coumadin for now with recheck in 1 to 2 weeks and discuss potential Xarelto at that time.  Advised him to increase his dose to 1 tablet daily except for half tablet 2 days/week.  He will contact the office for an appointment  [name], [place] [phone]

## 2021-04-24 NOTE — Patient Instructions (Signed)
I will reach out to Dr. Elonda Husky about your Coumadin.  I read through your GI doctors notes and it sounds like you are having bleeding hemorrhoids.  We will determine whether or not we should keep a lower INR goal pending Dr. Oleta Mouse recommendations  I have sent in doxycycline for as needed use.  We discussed indications for this including tick that is engorged, tick that has been attached for an unknown amount of time or 3 days.  Medication must be used within 72 hours of tick removal.  If you develop any concerning symptoms or signs like body aches, fevers, headaches, new onset fatigue or nausea you are to seek immediate medical attention  I have placed a referral to the ear nose and throat doctor for your vertigo and right-sided ear concerns.  If you have have no contact within the next week, please contact our office and ask for [facility].

## 2021-05-08 ENCOUNTER — Ambulatory Visit (INDEPENDENT_AMBULATORY_CARE_PROVIDER_SITE_OTHER): Payer: Medicare Other | Admitting: Family Medicine

## 2021-05-08 ENCOUNTER — Other Ambulatory Visit: Payer: Self-pay

## 2021-05-08 ENCOUNTER — Encounter: Payer: Self-pay | Admitting: Family Medicine

## 2021-05-08 VITALS — BP 146/90 | HR 83 | Temp 98.2°F | Ht 72.0 in | Wt 165.4 lb

## 2021-05-08 DIAGNOSIS — I482 Chronic atrial fibrillation, unspecified: Secondary | ICD-10-CM | POA: Diagnosis not present

## 2021-05-08 DIAGNOSIS — Z7901 Long term (current) use of anticoagulants: Secondary | ICD-10-CM | POA: Diagnosis not present

## 2021-05-08 DIAGNOSIS — R791 Abnormal coagulation profile: Secondary | ICD-10-CM

## 2021-05-08 LAB — COAGUCHEK XS/INR WAIVED
INR: 1.8 — ABNORMAL HIGH (ref 0.9–1.1)
Prothrombin Time: 21.9 s

## 2021-05-08 LAB — POCT INR: INR: 1.8 — AB (ref 2–3)

## 2021-05-08 NOTE — Progress Notes (Signed)
Subjective: CC:INR PCP: [name], DO HPI:Dalton Vaughan is a 87 y.o. male presenting to clinic today for:  1. AFib Goal 2-2.5 (due to bleeding).  Sees cardiology.  During our last visit his INR was subtherapeutic at 1.5.  I contacted his cardiologist and he did not feel that an INR less than 2 was appropriate in this patient and in fact recommended consideration for alternative therapy like Xarelto or Eliquis.  He felt that the bleeding may be less with this than warfarin.  The patient wanted to hold off until today's check to discuss this.  In the meantime, he was advised to increase his warfarin dose to 1 tablet daily except for 1/2 tablet 2 days/week.   He informs me today that he is actually gone to 1/2 tablet 1 day a week with 1 tablet daily all others.  Denies any hematuria or bleeding elsewhere.  He still not sure about the Xarelto.  Would like more information.   ROS: Per HPI  Allergies  Allergen Reactions  . Paxil [Paroxetine Hcl] Palpitations  . Eliquis [Apixaban] Other (See Comments)    dizziness  . Penicillins Other (See Comments)    Did it involve swelling of the face/tongue/throat, SOB, or low BP? Unknown Did it involve sudden or severe rash/hives, skin peeling, or any reaction on the inside of your mouth or nose? Unknown Did you need to seek medical attention at a hospital or doctor's office? Unknown When did it last happen?unk If all above answers are "NO", may proceed with cephalosporin use.   Christy Sartorius Sodium] Other (See Comments)    myalgias  . Zetia [Ezetimibe] Other (See Comments)    myalgias  . Vytorin [Ezetimibe-Simvastatin] Other (See Comments)    myalgias   Past Medical History:  Diagnosis Date  . Anal fissure   . Arthritis   . Atrial fibrillation ([place])   . BPH (benign prostatic hypertrophy)   . CAD (coronary artery disease)    Dr. Wynonia Lawman  . Cataract   . COVID-19   . Depression 2004  . Diverticulosis   . GERD  (gastroesophageal reflux disease)   . History of TIAs   . Hyperlipidemia   . Hypertension   . Hypertensive heart disease without CHF   . Hypothyroidism   . Internal hemorrhoids   . Lumbar disc disease   . Oral bleeding 08/20/2012   Following molar tooth extraction July, 2013  . Pneumonia   . Ruptured disk 1995  . Shingles   . Thyroid disease   . Vitamin D deficiency   . Vitreous detachment     Current Outpatient Medications:  .  amLODipine (NORVASC) 5 MG tablet, Take by mouth., Disp: , Rfl:  .  Calcium Carb-Cholecalciferol (CALCIUM + D3) 600-200 MG-UNIT TABS, Take 1 tablet by mouth daily. , Disp: , Rfl:  .  Cholecalciferol (VITAMIN D-3) 5000 UNITS TABS, Take 1 tablet by mouth daily., Disp: , Rfl:  .  doxazosin (CARDURA) 8 MG tablet, Take 1 tablet (8 mg total) by mouth at bedtime., Disp: 30 tablet, Rfl: 0 .  doxycycline (VIBRA-TABS) 100 MG tablet, Take 2 tablets as a single dose for lyme disease prophylaxis after tick bite. Repeat as needed as directed., Disp: 10 tablet, Rfl: 0 .  mirtazapine (REMERON) 15 MG tablet, TAKE 1 TABLET AT BEDTIME, Disp: 90 tablet, Rfl: 3 .  Multiple Vitamin (MULTIVITAMIN) tablet, Take 1 tablet by mouth daily., Disp: , Rfl:  .  ramipril (ALTACE) 10 MG capsule, Take 1  capsule (10 mg total) by mouth daily., Disp: 90 capsule, Rfl: 3 .  SIMETHICONE PO, Take by mouth as needed., Disp: , Rfl:  .  SYNTHROID 50 MCG tablet, TAKE 1 TABLET DAILY BEFORE BREAKFAST, Disp: 90 tablet, Rfl: 3 .  triamterene-hydrochlorothiazide (MAXZIDE-25) 37.5-25 MG tablet, Take 1 tablet by mouth daily., Disp: 90 tablet, Rfl: 3 .  vitamin C (ASCORBIC ACID) 500 MG tablet, Take 500 mg by mouth daily. , Disp: , Rfl:  .  warfarin (COUMADIN) 5 MG tablet, TAKE AS DIRECTED, Disp: 90 tablet, Rfl: 3 .  XIIDRA 5 % SOLN, Apply 1 drop to eye as needed. , Disp: , Rfl:  Social History   Socioeconomic History  . Marital status: Widowed    Spouse name: [name]   . Number of children: 1  . Years of  education: 75  . Highest education level: Not on file  Occupational History  . Occupation: Retired    Comment: Social research officer, government x 27 years  . 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 Smoking status: Former Smoker    Packs/day: 1.00    Years: 18.00    Pack years: 18.00    Types: Cigarettes    Start date: 12/17/1948    Quit date: 12/17/1966    Years since quitting: 54.4  . Smokeless tobacco: Never Used  Vaping Use  . Vaping Use: Never used  Substance and Sexual Activity  . Alcohol use: Yes    Alcohol/week: 1.0 standard drink    Types: 1 Standard drinks or equivalent per week    Comment: rare  . Drug use: No  . Sexual activity: Not on file  Other Topics Concern  . Not on file  Social History Narrative   Married.  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 Dementia Mother   . Arthritis Mother   . Cancer Brother        prostate  . Asthma Sister   . Heart disease Paternal Aunt   . Heart disease Paternal Uncle   . Hypertension Maternal Grandmother   . CVA Maternal Grandmother   . CVA Paternal Grandmother   . Hepatitis C Son   . Arthritis Son   . Colon cancer Neg Hx   . Colon polyps Neg Hx   . Kidney disease Neg Hx   . Esophageal cancer Neg Hx   . Gallbladder disease Neg Hx   . Diabetes Neg Hx     Objective: Office vital signs reviewed. BP (!) 146/90   Pulse 83   Temp 98.2 F (36.8 C)   Ht 6' (1.829 m)   Wt 165 lb 6.4 oz (75 kg)   SpO2 97%   BMI 22.43 kg/m   Physical Examination:  General: Awake, alert, well nourished, No  acute distress HEENT: Normal, sclera white Cardio: regular rate and rhythm, S1S2 heard, no murmurs appreciated Pulm: clear to auscultation bilaterally, no wheezes, rhonchi or rales; normal work of breathing on room air  Assessment/ Plan: 87 y.o. male   Chronic atrial fibrillation ([place]) - Plan: CoaguChek XS/INR Waived, POCT INR  Subtherapeutic international normalized ratio (INR) - Plan: POCT INR  Chronic anticoagulation - Plan: POCT INR  INR remains subtherapeutic at 1.8 today.  Increase Coumadin to 1 tablet daily.  We again talked about Xarelto and some information was provided.  Would like him to see Almyra Free to talk about risk versus benefits of Xarelto versus Coumadin.  I think that he would be a good candidate for Xarelto.  He will follow-up in 1 week for INR recheck.  This was scheduled prior to discharge today  No orders of the defined types were placed in this encounter.  No orders of the defined types were placed in this encounter.    [name], DO [place] [phone]

## 2021-05-08 NOTE — Patient Instructions (Signed)
Go up to 1 tablet of coumadin daily.   INR check in 1-2 weeks.  See Almyra Free ASAP to talk about Xarelto.

## 2021-05-16 ENCOUNTER — Ambulatory Visit: Payer: Medicare Other | Admitting: Physician Assistant

## 2021-05-16 DIAGNOSIS — R42 Dizziness and giddiness: Secondary | ICD-10-CM | POA: Diagnosis not present

## 2021-05-16 DIAGNOSIS — I10 Essential (primary) hypertension: Secondary | ICD-10-CM | POA: Diagnosis not present

## 2021-05-17 ENCOUNTER — Other Ambulatory Visit: Payer: Self-pay

## 2021-05-17 ENCOUNTER — Encounter: Payer: Self-pay | Admitting: Family Medicine

## 2021-05-17 ENCOUNTER — Ambulatory Visit (INDEPENDENT_AMBULATORY_CARE_PROVIDER_SITE_OTHER): Payer: Medicare Other | Admitting: Family Medicine

## 2021-05-17 VITALS — BP 131/72 | HR 74 | Temp 97.7°F | Ht 72.0 in | Wt 163.5 lb

## 2021-05-17 DIAGNOSIS — I482 Chronic atrial fibrillation, unspecified: Secondary | ICD-10-CM

## 2021-05-17 DIAGNOSIS — Z7901 Long term (current) use of anticoagulants: Secondary | ICD-10-CM | POA: Diagnosis not present

## 2021-05-17 LAB — COAGUCHEK XS/INR WAIVED
INR: 2 — ABNORMAL HIGH (ref 0.9–1.1)
Prothrombin Time: 24.5 s

## 2021-05-17 LAB — POCT INR: INR: 2 (ref 2–3)

## 2021-05-17 NOTE — Progress Notes (Signed)
Established Patient Office Visit  Subjective:  Patient ID: Dalton Vaughan, male    DOB: Dec 26, 1928  Age: 87 y.o. MRN: [id number]  CC:  Chief Complaint  Patient presents with  . Atrial Fibrillation    HPI [name] presents for INR.  Current regimen: 5 mg daily   Indication: chronic A. fibb Bleeding Signs/Symptoms:  denies Thromboembolic Signs/Symptoms: denies    Missed Coumadin Doses: none Medication Changes:  none Dietary Changes:  none Bacterial/Viral Infection:  none  He will meet with Almyra Free to discuss [name] next week. Denies chest pain or shortness of breath.   Past Medical History:  Diagnosis Date  . Anal fissure   . Arthritis   . Atrial fibrillation ([place])   . BPH (benign prostatic hypertrophy)   . CAD (coronary artery disease)    Dr. Wynonia Lawman  . Cataract   . COVID-19   . Depression 2004  . Diverticulosis   . GERD (gastroesophageal reflux disease)   . History of TIAs   . Hyperlipidemia   . Hypertension   . Hypertensive heart disease without CHF   . Hypothyroidism   . Internal hemorrhoids   . Lumbar disc disease   . Oral bleeding 08/20/2012   Following molar tooth extraction July, 2013  . Pneumonia   . Ruptured disk 1995  . Shingles   . Thyroid disease   . Vitamin D deficiency   . Vitreous detachment     Past Surgical History:  Procedure Laterality Date  . bilateral cataract surgery  6/07  . CARDIAC CATHETERIZATION    . CORONARY ARTERY BYPASS GRAFT  12/99    Dr. Servando Snare   . cysloscopy  8/08  . EYE SURGERY    . HERNIA REPAIR  1997   right  . herninated disc  1995  . PACEMAKER INSERTION  11/03/2020  . PROSTATE BIOPSY  8/08  . TONSILLECTOMY AND ADENOIDECTOMY      Family History  Problem Relation Age of Onset  . Dementia Mother   . Arthritis Mother   . Cancer Brother        prostate  . Asthma Sister   . Heart disease Paternal Aunt   . Heart disease Paternal Uncle   . Hypertension Maternal Grandmother   . CVA Maternal Grandmother   .  CVA Paternal Grandmother   . Hepatitis C Son   . Arthritis Son   . Colon cancer Neg Hx   . Colon polyps Neg Hx   . Kidney disease Neg Hx   . Esophageal cancer Neg Hx   . Gallbladder disease Neg Hx   . Diabetes Neg Hx     Social History   Socioeconomic History  . Marital status: Widowed    Spouse name: [name]   . Number of children: 1  . Years of education: 105  . Highest education level: Not on file  Occupational History  . Occupation: Retired    Comment: Social research officer, government x 27 years  . 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 Smoking status: Former Smoker    Packs/day: 1.00    Years: 18.00    Pack years: 18.00    Types: Cigarettes    Start date: 12/17/1948    Quit date: 12/17/1966    Years since quitting: 54.4  . Smokeless tobacco: Never Used  Vaping Use  . Vaping Use: Never used  Substance and Sexual Activity  . Alcohol use: Yes  Alcohol/week: 1.0 standard drink    Types: 1 Standard drinks or equivalent per week    Comment: rare  . Drug use: No  . Sexual activity: Not on file  Other Topics Concern  . Not on file  Social History Narrative   Married.  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 amLODipine (NORVASC) 5 MG tablet Take by mouth.    . Calcium Carb-Cholecalciferol (CALCIUM + D3) 600-200 MG-UNIT TABS Take 1 tablet by mouth daily.     . Cholecalciferol (VITAMIN D-3) 5000 UNITS TABS Take 1 tablet by mouth daily.    Marland Kitchen doxazosin (CARDURA) 8 MG tablet Take 1 tablet (8 mg total) by mouth at bedtime.  30 tablet 0  . mirtazapine (REMERON) 15 MG tablet TAKE 1 TABLET AT BEDTIME 90 tablet 3  . Multiple Vitamin (MULTIVITAMIN) tablet Take 1 tablet by mouth daily.    . ramipril (ALTACE) 10 MG capsule Take 1 capsule (10 mg total) by mouth daily. 90 capsule 3  . SIMETHICONE PO Take by mouth as needed.    Marland Kitchen SYNTHROID 50 MCG tablet TAKE 1 TABLET DAILY BEFORE BREAKFAST 90 tablet 3  . triamterene-hydrochlorothiazide (MAXZIDE-25) 37.5-25 MG tablet Take 1 tablet by mouth daily. 90 tablet 3  . vitamin C (ASCORBIC ACID) 500 MG tablet Take 500 mg by mouth daily.     Marland Kitchen warfarin (COUMADIN) 5 MG tablet TAKE AS DIRECTED 90 tablet 3  . XIIDRA 5 % SOLN Apply 1 drop to eye as needed.     . doxycycline (VIBRA-TABS) 100 MG tablet Take 2 tablets as a single dose for lyme disease prophylaxis after tick bite. Repeat as needed as directed. 10 tablet 0   No facility-administered medications prior to visit.    Allergies  Allergen Reactions  . Paxil [Paroxetine Hcl] Palpitations  . Eliquis [Apixaban] Other (See Comments)    dizziness  . Penicillins Other (See Comments)    Did it involve swelling of the face/tongue/throat, SOB, or low BP? Unknown Did it involve sudden or severe rash/hives, skin peeling, or any reaction on the inside of your mouth or nose? Unknown Did you need to seek medical attention at a hospital or doctor's office? Unknown When did it last happen?unk If all above answers are "NO", may proceed with cephalosporin use.   Christy Sartorius Sodium] Other (See Comments)    myalgias  . Zetia [Ezetimibe] Other (See Comments)    myalgias  . Vytorin [Ezetimibe-Simvastatin] Other (See Comments)    myalgias    ROS Review of Systems As per HPI.    Objective:    Physical Exam Vitals and nursing note reviewed.  Constitutional:      General: He is not in acute distress.    Appearance: Normal appearance. He is not ill-appearing, toxic-appearing or diaphoretic.  Cardiovascular:     Rate  and Rhythm: Normal rate and regular rhythm.     Heart sounds: Normal heart sounds. No murmur heard.   Pulmonary:     Effort: Pulmonary effort is normal. No  respiratory distress.     Breath sounds: Normal breath sounds.  Neurological:     General: No focal deficit present.     Mental Status: He is alert and oriented to person, place, and time.  Psychiatric:        Mood and Affect: Mood normal.        Behavior: Behavior normal.     BP 131/72   Pulse 74   Temp 97.7 F (36.5 C) (Temporal)   Ht 6' (1.829 m)   Wt 163 lb 8 oz (74.2 kg)   BMI 22.17 kg/m  Wt Readings from Last 3 Encounters:  05/17/21 163 lb 8 oz (74.2 kg)  05/08/21 165 lb 6.4 oz (75 kg)  04/24/21 164 lb 12.8 oz (74.8 kg)     Health Maintenance Due  Topic Date Due  . COVID-19 Vaccine (3 - Moderna risk 4-dose series) 04/12/2020    There are no preventive care reminders to display for this patient.  Lab Results  Component Value Date   TSH 3.970 02/22/2021   Lab Results  Component Value Date   WBC 5.4 02/22/2021   HGB 14.3 02/22/2021   HCT 42.2 02/22/2021   MCV 91 02/22/2021   PLT 147 (L) 02/22/2021   Lab Results  Component Value Date   NA 138 01/06/2021   K 3.9 01/06/2021   CO2 28 01/06/2021   GLUCOSE 81 01/06/2021   BUN 7 (L) 01/06/2021   CREATININE 0.72 (L) 01/06/2021   BILITOT 0.4 01/06/2021   ALKPHOS 112 01/06/2021   AST 19 01/06/2021   ALT 13 01/06/2021   PROT 6.3 01/06/2021   ALBUMIN 4.4 01/06/2021   CALCIUM 9.3 01/06/2021   ANIONGAP 5 09/20/2019   Lab Results  Component Value Date   CHOL 175 02/22/2021   Lab Results  Component Value Date   HDL 54 02/22/2021   Lab Results  Component Value Date   LDLCALC 107 (H) 02/22/2021   Lab Results  Component Value Date   TRIG 74 02/22/2021   Lab Results  Component Value Date   CHOLHDL 3.2 02/22/2021   Lab Results  Component Value Date   HGBA1C 5.5 02/05/2019      Assessment & Plan:   Elgar was seen today for atrial  fibrillation.  Diagnoses and all orders for this visit:  Chronic atrial fibrillation (HCC)/ Chronic anticoagulation At goal today. Continue current regimen of 17m daily. He meets with JAlmyra Freenext week to discuss Xarelto vs coumadin.  -     CoaguChek XS/INR Waived -     POCT INR   Follow-up: Return in about 6 weeks (around 06/28/2021) for INR.   The patient indicates understanding of these issues and agrees with the plan.   TGwenlyn Perking FNP

## 2021-05-23 ENCOUNTER — Ambulatory Visit (INDEPENDENT_AMBULATORY_CARE_PROVIDER_SITE_OTHER): Payer: Medicare Other | Admitting: Pharmacist

## 2021-05-23 ENCOUNTER — Telehealth: Payer: Self-pay | Admitting: Family Medicine

## 2021-05-23 ENCOUNTER — Other Ambulatory Visit: Payer: Self-pay

## 2021-05-23 DIAGNOSIS — I482 Chronic atrial fibrillation, unspecified: Secondary | ICD-10-CM

## 2021-05-23 MED ORDER — WARFARIN SODIUM 5 MG PO TABS: ORAL_TABLET | ORAL | 3 refills | Status: DC

## 2021-05-23 MED ORDER — RIVAROXABAN 20 MG PO TABS: 20.0000 mg | ORAL_TABLET | Freq: Every day | ORAL | 3 refills | Status: DC

## 2021-05-23 NOTE — Progress Notes (Signed)
    05/23/2021 Name: Dalton Vaughan MRN: [id number] DOB: 12-20-1928   S:  89 YOM Presents for medication management.  He is debating on switching from warfarin to eliquis/xarelto.  PCP has cleared this transition with cardiology if patient is amenable and can afford.  Patient has tricare for life Patient is currently on eliquis for history of DVT (therapy for 2 yrs, lifelong since recurrent DVT)  She is stable and tolerating medication.  She has hit the coverage gap and is experiencing very high copays.   Insurance coverage/medication affordability: tricare for life; medicare   Patient reports adherence with medications.     O:  CBC    Component Value Date/Time   WBC 5.4 02/22/2021 0854   WBC 12.5 (H) 09/20/2019 0150   RBC 4.62 02/22/2021 0854   RBC 3.63 (L) 09/20/2019 0150   HGB 14.3 02/22/2021 0854   HGB 12.1 (L) 08/20/2012 1100   HCT 42.2 02/22/2021 0854   HCT 37.7 (L) 08/20/2012 1100   PLT 147 (L) 02/22/2021 0854   MCV 91 02/22/2021 0854   MCV 88.5 08/20/2012 1100   MCH 31.0 02/22/2021 0854   MCH 28.9 09/20/2019 0150   MCHC 33.9 02/22/2021 0854   MCHC 32.0 09/20/2019 0150   RDW 14.0 02/22/2021 0854   RDW 15.3 (H) 08/20/2012 1100   LYMPHSABS 2.2 10/07/2019 1126   LYMPHSABS 2.1 08/20/2012 1100   MONOABS 1.0 09/20/2019 0150   MONOABS 0.4 08/20/2012 1100   EOSABS 0.6 (H) 10/07/2019 1126   BASOSABS 0.0 10/07/2019 1126   BASOSABS 0.0 08/20/2012 1100      GFR 83   A/P:  PCP spoke to cardiology to clear anticoagulant switch Bleeding issues on warfarin (urine, 2013 lost several units of blood--tired, dizziness) Not on baby aspirin  Has tried eliquis (not able to take due to dizziness per patient report)  Will call in xarelto to see if covered (Switching from warfarin to rivaroxaban: Discontinue warfarin and initiate rivaroxaban when INR is less than 3_ Warfarin sent to [facility] in the meantime Patient to let us know which anticoagulant he prefers so we can clean up  med list and only have one anticoagulant He is awaiting shipment of Xarelto     Information about your medication: Anticoagulant Rivaroxaban is used to reduce the risk of forming blood clots that cause a stroke due to an irregular heartbeat.   Common SIDE EFFECTS you may experience include: extremity pain and increased risk of bleeding or bruising.   This drug must be taken consistently as prescribed to maintain its effect.   Multiple drug-drug interactions exist for this medication.  Consult healthcare professional prior to starting a new drug.   Tell your physicians and dentists that you are taking this drug before elective surgery or invasive procedures and before any new drug is prescribed.   Contact your health care provider if you experience: any signs of blood loss or unusual bleeding.    Patient instructions provided.  Total time in face to face counseling 25 minutes.  Regina Eck, PharmD, BCPS Clinical Pharmacist, [place]  [facility] Phone [phone]

## 2021-06-22 DIAGNOSIS — B351 Tinea unguium: Secondary | ICD-10-CM | POA: Diagnosis not present

## 2021-06-22 DIAGNOSIS — L84 Corns and callosities: Secondary | ICD-10-CM | POA: Diagnosis not present

## 2021-06-22 DIAGNOSIS — I70203 Unspecified atherosclerosis of native arteries of extremities, bilateral legs: Secondary | ICD-10-CM | POA: Diagnosis not present

## 2021-06-22 DIAGNOSIS — M79676 Pain in unspecified toe(s): Secondary | ICD-10-CM | POA: Diagnosis not present

## 2021-06-29 ENCOUNTER — Ambulatory Visit: Payer: Medicare Other | Admitting: Family Medicine

## 2021-06-30 ENCOUNTER — Encounter: Payer: Self-pay | Admitting: Family Medicine

## 2021-06-30 ENCOUNTER — Ambulatory Visit (INDEPENDENT_AMBULATORY_CARE_PROVIDER_SITE_OTHER): Payer: Medicare Other | Admitting: Family Medicine

## 2021-06-30 ENCOUNTER — Other Ambulatory Visit: Payer: Self-pay

## 2021-06-30 VITALS — BP 128/69 | HR 77 | Temp 97.6°F | Ht 72.0 in | Wt 164.6 lb

## 2021-06-30 DIAGNOSIS — Z7901 Long term (current) use of anticoagulants: Secondary | ICD-10-CM | POA: Diagnosis not present

## 2021-06-30 DIAGNOSIS — L989 Disorder of the skin and subcutaneous tissue, unspecified: Secondary | ICD-10-CM | POA: Diagnosis not present

## 2021-06-30 DIAGNOSIS — I482 Chronic atrial fibrillation, unspecified: Secondary | ICD-10-CM | POA: Diagnosis not present

## 2021-06-30 LAB — COAGUCHEK XS/INR WAIVED
INR: 2.5 — ABNORMAL HIGH (ref 0.9–1.1)
Prothrombin Time: 30.1 s

## 2021-06-30 LAB — POCT INR: INR: 2.5 (ref 2–3)

## 2021-06-30 NOTE — Progress Notes (Signed)
Established Patient Office Visit  Subjective:  Patient ID: Dalton Vaughan, male    DOB: 04/05/1929  Age: 87 y.o. MRN: [id number]  CC:  Chief Complaint  Patient presents with   Anticoagulation    HPI Dalton Vaughan presents for INR.   Current regimen: 5 mg daily   Indication: chronic A. fibb Bleeding Signs/Symptoms:  denies Thromboembolic Signs/Symptoms: denies    Missed Coumadin Doses: none Medication Changes:  none Dietary Changes:  none Bacterial/Viral Infection:  none  He has a spot on the right side of his nose that he would like to see dermatology about. It has been present for many years but has started to become tender for the last 6 months. He has to adjust his glasses so that they do not rest on this area. Denies erythema, warmth, or drainage from the area.   Past Medical History:  Diagnosis Date   Anal fissure    Arthritis    Atrial fibrillation (HCC)    BPH (benign prostatic hypertrophy)    CAD (coronary artery disease)    Dr. Wynonia Lawman   Cataract    COVID-19    Depression 2004   Diverticulosis    GERD (gastroesophageal reflux disease)    History of TIAs    Hyperlipidemia    Hypertension    Hypertensive heart disease without CHF    Hypothyroidism    Internal hemorrhoids    Lumbar disc disease    Oral bleeding 08/20/2012   Following molar tooth extraction July, 2013   Pneumonia    Ruptured disk 1995   Shingles    Thyroid disease    Vitamin D deficiency    Vitreous detachment     Past Surgical History:  Procedure Laterality Date   bilateral cataract surgery  6/07   CARDIAC CATHETERIZATION     CORONARY ARTERY BYPASS GRAFT  12/99    Dr. Servando Snare    cysloscopy  8/08   EYE SURGERY     HERNIA REPAIR  1997   right   herninated disc  1995   PACEMAKER INSERTION  11/03/2020   PROSTATE BIOPSY  8/08   TONSILLECTOMY AND ADENOIDECTOMY      Family History  Problem Relation Age of Onset   Dementia Mother    Arthritis Mother    Cancer Brother         prostate   Asthma Sister    Heart disease Paternal Aunt    Heart disease Paternal Uncle    Hypertension Maternal Grandmother    CVA Maternal Grandmother    CVA Paternal Grandmother    Hepatitis C Son    Arthritis Son    Colon cancer Neg Hx    Colon polyps Neg Hx    Kidney disease Neg Hx    Esophageal cancer Neg Hx    Gallbladder disease Neg Hx    Diabetes Neg Hx     Social History   Socioeconomic History   Marital status: Widowed    Spouse name: [name]    Number of children: 1   Years of education: 12   Highest education level: Not on file  Occupational History   Occupation: Retired    Comment: Social research officer, government x 27 years   Occupation: Retired    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     Calcium Carb-Cholecalciferol (CALCIUM + D3) 600-200 MG-UNIT TABS Take 1 tablet by mouth daily.      Cholecalciferol (VITAMIN D-3) 5000  UNITS TABS Take 1 tablet by mouth daily.     doxazosin (CARDURA) 8 MG tablet Take 1 tablet (8 mg total) by mouth at bedtime. 30 tablet 0   mirtazapine (REMERON) 15 MG tablet TAKE 1 TABLET AT BEDTIME 90 tablet 3   Multiple Vitamin (MULTIVITAMIN) tablet Take 1 tablet by mouth daily.     ramipril (ALTACE) 10 MG capsule Take 1 capsule (10 mg total) by mouth daily. 90 capsule 3   rivaroxaban (XARELTO) 20 MG TABS tablet Take 1 tablet (20 mg total) by mouth daily with supper. 90 tablet 3   SIMETHICONE PO Take by mouth as needed.     SYNTHROID 50 MCG tablet TAKE 1 TABLET DAILY BEFORE BREAKFAST 90 tablet 3   triamterene-hydrochlorothiazide (MAXZIDE-25) 37.5-25 MG tablet Take 1 tablet by mouth daily. 90 tablet 3   vitamin C (ASCORBIC ACID) 500 MG tablet Take 500 mg by mouth daily.      warfarin (COUMADIN) 5 MG tablet Take 1 tablet (30m) by mouth daily as directed 30 tablet 3   XIIDRA 5 % SOLN Apply 1 drop to eye as needed.      No facility-administered medications prior to visit.    Allergies  Allergen Reactions   Paxil [Paroxetine Hcl] Palpitations   Eliquis [Apixaban] Other (See Comments)    dizziness   Penicillins Other (See Comments)    Did it involve swelling of the face/tongue/throat, SOB, or low BP? Unknown Did it involve sudden or severe rash/hives, skin peeling, or any reaction on the inside of your mouth or nose? Unknown Did you need to seek medical attention at a hospital or doctor's office? Unknown When did it last happen?      unk If all above answers are "NO", may proceed with cephalosporin use.    Pravachol [Pravastatin Sodium] Other (See Comments)    myalgias   Zetia [Ezetimibe] Other (See Comments)    myalgias   Vytorin [Ezetimibe-Simvastatin] Other (See Comments)    myalgias    ROS Review of Systems As per HPI.    Objective:    Physical Exam Vitals and nursing note reviewed.  Constitutional:      General: He is not in acute distress.    Appearance: He  is not  ill-appearing, toxic-appearing or diaphoretic.  Neck:     Vascular: No JVD.  Cardiovascular:     Rate and Rhythm: Normal rate and regular rhythm.     Heart sounds: Normal heart sounds. No murmur heard. Pulmonary:     Effort: Pulmonary effort is normal. No respiratory distress.     Breath sounds: Normal breath sounds.  Skin:    General: Skin is warm and dry.     Findings: Lesion (flesh color rasied lesion to right side of nose) present.  Neurological:     General: No focal deficit present.     Mental Status: He is alert and oriented to person, place, and time.  Psychiatric:        Mood and Affect: Mood normal.        Behavior: Behavior normal.    BP 128/69   Pulse 77   Temp 97.6 F (36.4 C) (Temporal)   Ht 6' (1.829 m)   Wt 164 lb 9.6 oz (74.7 kg)   BMI 22.32 kg/m  Wt Readings from Last 3 Encounters:  06/30/21 164 lb 9.6 oz (74.7 kg)  05/17/21 163 lb 8 oz (74.2 kg)  05/08/21 165 lb 6.4 oz (75 kg)     Health Maintenance Due  Topic Date Due   COVID-19 Vaccine (3 - Moderna risk series) 04/12/2020    There are no preventive care reminders to display for this patient.  Lab Results  Component Value Date   TSH 3.970 02/22/2021   Lab Results  Component Value Date   WBC 5.4 02/22/2021   HGB 14.3 02/22/2021   HCT 42.2 02/22/2021   MCV 91 02/22/2021   PLT 147 (L) 02/22/2021   Lab Results  Component Value Date   NA 138 01/06/2021   K 3.9 01/06/2021   CO2 28 01/06/2021   GLUCOSE 81 01/06/2021   BUN 7 (L) 01/06/2021   CREATININE 0.72 (L) 01/06/2021   BILITOT 0.4 01/06/2021   ALKPHOS 112 01/06/2021   AST 19 01/06/2021   ALT 13 01/06/2021   PROT 6.3 01/06/2021   ALBUMIN 4.4 01/06/2021   CALCIUM 9.3 01/06/2021   ANIONGAP 5 09/20/2019   Lab Results  Component Value Date   CHOL 175 02/22/2021   Lab Results  Component Value Date   HDL 54 02/22/2021   Lab Results  Component Value Date   LDLCALC 107 (H) 02/22/2021   Lab Results  Component Value Date    TRIG 74 02/22/2021   Lab Results  Component Value Date   CHOLHDL 3.2 02/22/2021   Lab Results  Component Value Date   HGBA1C 5.5 02/05/2019      Assessment & Plan:   Dalton Vaughan was seen today for anticoagulation.  Diagnoses and all orders for this visit:  Chronic atrial fibrillation (HCC) Chronic anticoagulation -     POCT INR  Description   INR at goal. Continue 5 mg daily. Recheck in 6 weeks.       Follow-up: Return in about 6 weeks (around 08/11/2021) for with PCP for chronic follow up and INR.   The patient indicates understanding of these issues and agrees with the plan.   Gwenlyn Perking, FNP

## 2021-06-30 NOTE — Patient Instructions (Addendum)
   Description   INR at goal. Continue 5 mg daily. Recheck in 6 weeks.

## 2021-07-19 DIAGNOSIS — L814 Other melanin hyperpigmentation: Secondary | ICD-10-CM | POA: Diagnosis not present

## 2021-07-19 DIAGNOSIS — L821 Other seborrheic keratosis: Secondary | ICD-10-CM | POA: Diagnosis not present

## 2021-07-19 DIAGNOSIS — D1801 Hemangioma of skin and subcutaneous tissue: Secondary | ICD-10-CM | POA: Diagnosis not present

## 2021-07-19 DIAGNOSIS — D485 Neoplasm of uncertain behavior of skin: Secondary | ICD-10-CM | POA: Diagnosis not present

## 2021-07-19 DIAGNOSIS — L820 Inflamed seborrheic keratosis: Secondary | ICD-10-CM | POA: Diagnosis not present

## 2021-08-08 ENCOUNTER — Encounter: Payer: Self-pay | Admitting: Family Medicine

## 2021-08-08 ENCOUNTER — Ambulatory Visit (INDEPENDENT_AMBULATORY_CARE_PROVIDER_SITE_OTHER): Payer: Medicare Other | Admitting: Family Medicine

## 2021-08-08 ENCOUNTER — Other Ambulatory Visit: Payer: Self-pay

## 2021-08-08 VITALS — BP 123/72 | HR 78 | Temp 98.1°F | Resp 20 | Ht 72.0 in | Wt 162.0 lb

## 2021-08-08 DIAGNOSIS — I10 Essential (primary) hypertension: Secondary | ICD-10-CM

## 2021-08-08 DIAGNOSIS — K5792 Diverticulitis of intestine, part unspecified, without perforation or abscess without bleeding: Secondary | ICD-10-CM | POA: Insufficient documentation

## 2021-08-08 MED ORDER — AMLODIPINE BESYLATE 5 MG PO TABS: 5.0000 mg | ORAL_TABLET | Freq: Every day | ORAL | 0 refills | Status: DC

## 2021-08-08 MED ORDER — METRONIDAZOLE 500 MG PO TABS: 500.0000 mg | ORAL_TABLET | Freq: Two times a day (BID) | ORAL | 0 refills | Status: DC

## 2021-08-08 MED ORDER — CIPROFLOXACIN HCL 500 MG PO TABS
500.0000 mg | ORAL_TABLET | Freq: Two times a day (BID) | ORAL | 0 refills | Status: AC
Start: 2021-08-08 — End: 2021-08-15

## 2021-08-08 NOTE — Progress Notes (Signed)
Subjective:  Patient ID: Dalton Vaughan, male    DOB: 07-Aug-1929, 87 y.o.   MRN: [id number]  Patient Care Team: [name], DO as PCP - General (Family Medicine) Raynelle Bring, MD as Consulting Physician (Urology) Pyrtle, Lajuan Lines, MD as Consulting Physician (Gastroenterology) Clent Jacks, MD as Consulting Physician (Ophthalmology) Loni Beckwith Neita Goodnight., MD as Referring Physician (Cardiology)   Chief Complaint:  Diverticulitis (Abd pain )   HPI: Dalton Vaughan is a 87 y.o. male presenting on 08/08/2021 for Diverticulitis (Abd pain )  Abdominal Pain This is a new problem. The current episode started in the past 7 days. The onset quality is sudden. The problem occurs intermittently. The most recent episode lasted 6 days. The problem has been gradually worsening. The pain is located in the LLQ, periumbilical region and suprapubic region. The pain is at a severity of 4/10. The pain is mild. The quality of the pain is aching, cramping, colicky and sharp. The abdominal pain radiates to the RLQ. Associated symptoms include constipation, diarrhea and flatus. Pertinent negatives include no anorexia, arthralgias, belching, dysuria, fever, frequency, headaches, hematochezia, hematuria, melena, myalgias, nausea, vomiting or weight loss. The pain is aggravated by eating and bowel movement. The pain is relieved by Nothing. He has tried nothing for the symptoms. His past medical history is significant for GERD. Diverticulosis  Hypertension This is a chronic problem. The current episode started more than 1 year ago. The problem is unchanged. The problem is controlled. Pertinent negatives include no anxiety, blurred vision, chest pain, headaches, malaise/fatigue, neck pain, orthopnea, palpitations, peripheral edema, PND, shortness of breath or sweats. Risk factors for coronary artery disease include male gender. Past treatments include calcium channel blockers. The current treatment provides  significant improvement. There are no compliance problems.      Relevant past medical, surgical, family, and social history reviewed and updated as indicated.  Allergies and medications reviewed and updated. Data reviewed: Chart in Epic.   Past Medical History:  Diagnosis Date   Anal fissure    Arthritis    Atrial fibrillation (HCC)    BPH (benign prostatic hypertrophy)    CAD (coronary artery disease)    Dr. Wynonia Lawman   Cataract    COVID-19    Depression 2004   Diverticulosis    GERD (gastroesophageal reflux disease)    History of TIAs    Hyperlipidemia    Hypertension    Hypertensive heart disease without CHF    Hypothyroidism    Internal hemorrhoids    Lumbar disc disease    Oral bleeding 08/20/2012   Following molar tooth extraction July, 2013   Pneumonia    Ruptured disk 1995   Shingles    Thyroid disease    Vitamin D deficiency    Vitreous detachment     Past Surgical History:  Procedure Laterality Date   bilateral cataract surgery  6/07   CARDIAC CATHETERIZATION     CORONARY ARTERY BYPASS GRAFT  12/99    Dr. 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    Cholecalciferol (VITAMIN D-3) 5000 UNITS TABS Take 1 tablet by mouth daily.   ciprofloxacin (CIPRO) 500 MG tablet Take 1 tablet (500 mg total) by mouth 2 (two) times daily for 7 days.   doxazosin (CARDURA) 8 MG tablet Take 1 tablet (8 mg total) by mouth at bedtime.   metroNIDAZOLE (FLAGYL) 500 MG tablet Take 1 tablet (500 mg total) by mouth 2 (two) times daily.   mirtazapine (REMERON) 15 MG tablet TAKE 1 TABLET AT BEDTIME   Multiple Vitamin (MULTIVITAMIN) tablet Take 1 tablet by mouth daily.   ramipril (ALTACE) 10 MG capsule Take 1 capsule (10 mg total) by mouth daily.   SIMETHICONE PO Take by mouth as needed.   SYNTHROID 50 MCG tablet TAKE 1 TABLET DAILY BEFORE BREAKFAST   triamterene-hydrochlorothiazide (MAXZIDE-25) 37.5-25 MG tablet Take 1 tablet by mouth daily.   vitamin C (ASCORBIC ACID) 500 MG tablet Take 500 mg by mouth daily.    warfarin (COUMADIN) 5 MG tablet Take 1 tablet (70m) by mouth daily as directed   XIIDRA 5 % SOLN Apply 1 drop to eye as needed.    [DISCONTINUED] amLODipine (NORVASC) 5 MG tablet Take by mouth.   [DISCONTINUED] rivaroxaban (XARELTO) 20 MG TABS tablet Take 1 tablet (20 mg total) by mouth daily  with supper.   amLODipine (NORVASC) 5 MG tablet Take 1 tablet (5 mg total) by mouth daily.   No facility-administered encounter medications on file as of 08/08/2021.    Allergies  Allergen Reactions   Paxil [Paroxetine Hcl] Palpitations   Eliquis [Apixaban] Other (See Comments)    dizziness   Penicillins Other (See Comments)    Did it involve swelling of the face/tongue/throat, SOB, or low BP? Unknown Did it involve sudden or severe rash/hives, skin peeling, or any reaction on the inside of your mouth or nose? Unknown Did you need to seek medical attention at a hospital or doctor's office? Unknown When did it last  happen?      unk If all above answers are "NO", may proceed with cephalosporin use.    Pravachol [Pravastatin Sodium] Other (See Comments)    myalgias   Zetia [Ezetimibe] Other (See Comments)    myalgias   Vytorin [Ezetimibe-Simvastatin] Other (See Comments)    myalgias    Review of Systems  Constitutional:  Negative for activity change, appetite change, chills, diaphoresis, fatigue, fever, malaise/fatigue, unexpected weight change and weight loss.  HENT: Negative.    Eyes: Negative.  Negative for blurred vision.  Respiratory:  Negative for cough, chest tightness and shortness of breath.   Cardiovascular:  Negative for chest pain, palpitations, orthopnea, leg swelling and PND.  Gastrointestinal:  Positive for abdominal pain, constipation, diarrhea and flatus. Negative for abdominal distention, anal bleeding, anorexia, blood in stool, hematochezia, melena, nausea, rectal pain and vomiting.  Endocrine: Negative.   Genitourinary:  Negative for decreased urine volume, difficulty urinating, dysuria, frequency, hematuria and urgency.  Musculoskeletal:  Negative for arthralgias, myalgias and neck pain.  Skin: Negative.   Allergic/Immunologic: Negative.   Neurological:  Negative for dizziness, tremors, seizures, syncope, facial asymmetry, speech difficulty, weakness,  light-headedness, numbness and headaches.  Hematological: Negative.   Psychiatric/Behavioral:  Negative for confusion, hallucinations, sleep disturbance and suicidal ideas.   All other systems reviewed and are negative.      Objective:  BP 123/72   Pulse 78   Temp 98.1 F (36.7 C)   Resp 20   Ht 6' (1.829 m)   Wt 162 lb (73.5 kg)   SpO2 97%   BMI 21.97 kg/m    Wt Readings from Last 3 Encounters:  08/08/21 162 lb (73.5 kg)  06/30/21 164 lb 9.6 oz (74.7 kg)  05/17/21 163 lb 8 oz (74.2 kg)    Physical Exam Vitals and nursing note reviewed.  Constitutional:      General: He is not in acute distress.    Appearance: Normal appearance. He is well-developed and well-groomed. He is not ill-appearing, toxic-appearing or diaphoretic.  HENT:     Head: Normocephalic and atraumatic.     Jaw: There is normal jaw occlusion.     Right Ear: Decreased hearing noted.     Left Ear: Decreased hearing noted.     Nose: Nose normal.     Mouth/Throat:     Lips: Pink.     Mouth: Mucous membranes are moist.     Pharynx: Oropharynx is clear. Uvula midline.  Eyes:     General: Lids are normal.     Extraocular Movements: Extraocular movements intact.     Conjunctiva/sclera: Conjunctivae normal.     Pupils: Pupils are equal, round, and reactive to light.  Neck:     Thyroid: No thyroid mass, thyromegaly or thyroid tenderness.     Vascular: No carotid bruit or JVD.     Trachea: Trachea and phonation normal.  Cardiovascular:     Rate and Rhythm: Normal rate and regular rhythm.     Chest Wall: PMI is not displaced.     Pulses: Normal pulses.     Heart sounds: Normal heart sounds. No murmur heard.   No friction rub. No gallop.  Pulmonary:     Effort: Pulmonary effort is normal. No respiratory distress.     Breath sounds: Normal breath sounds. No wheezing.  Abdominal:     General: Abdomen is flat. Bowel sounds are normal. There is no distension or abdominal bruit.     Palpations: Abdomen is  soft. There is  no hepatomegaly or splenomegaly.     Tenderness: There is abdominal tenderness in the suprapubic area, left upper quadrant and left lower quadrant. There is no right CVA tenderness, left CVA tenderness, guarding or rebound. Negative signs include Murphy's sign, Rovsing's sign, McBurney's sign, psoas sign and obturator sign.     Hernia: No hernia is present.  Musculoskeletal:        General: Normal range of motion.     Cervical back: Normal range of motion and neck supple.     Right lower leg: No edema.     Left lower leg: No edema.  Lymphadenopathy:     Cervical: No cervical adenopathy.  Skin:    General: Skin is warm and dry.     Capillary Refill: Capillary refill takes less than 2 seconds.     Coloration: Skin is not cyanotic, jaundiced or pale.     Findings: No rash.  Neurological:     General: No focal deficit present.     Mental Status: He is alert and oriented to person, place, and time.     Cranial Nerves: Cranial nerves are intact.     Sensory: Sensation is intact.     Motor: Motor function is intact.     Coordination: Coordination is intact.     Gait: Gait is intact.     Deep Tendon Reflexes: Reflexes are normal and symmetric.  Psychiatric:        Attention and Perception: Attention and perception normal.        Mood and Affect: Mood and affect normal.        Speech: Speech normal.        Behavior: Behavior normal. Behavior is cooperative.        Thought Content: Thought content normal.        Cognition and Memory: Cognition and memory normal.        Judgment: Judgment normal.    Results for orders placed or performed in visit on 06/30/21  CoaguChek XS/INR Waived  Result Value Ref Range   INR 2.5 (H) 0.9 - 1.1   Prothrombin Time 30.1 sec  POCT INR  Result Value Ref Range   INR 2.5 2 - 3       Pertinent labs & imaging results that were available during my care of the patient were reviewed by me and considered in my medical decision  making.  Assessment & Plan:  Nixon was seen today for diverticulitis.  Diagnoses and all orders for this visit:  Diverticulitis Ongoing symptoms for 5 days, will initiate Cipro and Flagyl. Pt aware to check INR weekly for next 3 weeks due to antibiotic therapy. Will check CBC. Symptomatic care discussed in detail along with adequate hydration and diet. Report any new, worsening, or persistent symptoms.  -     CBC with Differential/Platelet -     ciprofloxacin (CIPRO) 500 MG tablet; Take 1 tablet (500 mg total) by mouth 2 (two) times daily for 7 days. -     metroNIDAZOLE (FLAGYL) 500 MG tablet; Take 1 tablet (500 mg total) by mouth 2 (two) times daily.  Primary hypertension Well controlled on below. Will check CBC and BMP today. DASH diet and exercise discussed in detail Report any high or low readings.  -     amLODipine (NORVASC) 5 MG tablet; Take 1 tablet (5 mg total) by mouth daily. -     BMP8+EGFR -     CBC with Differential/Platelet     Continue all other  maintenance medications.  Follow up plan: Return in about 2 weeks (around 08/22/2021), or if symptoms worsen or fail to improve.   Continue healthy lifestyle choices, including diet (rich in fruits, vegetables, and lean proteins, and low in salt and simple carbohydrates) and exercise (at least 30 minutes of moderate physical activity daily).  Educational handout given for diverticulitis   The above assessment and management plan was discussed with the patient. The patient verbalized understanding of and has agreed to the management plan. Patient is aware to call the clinic if they develop any new symptoms or if symptoms persist or worsen. Patient is aware when to return to the clinic for a follow-up visit. Patient educated on when it is appropriate to go to the emergency department.   Monia Pouch, FNP-C [facility] [phone]

## 2021-08-09 ENCOUNTER — Telehealth: Payer: Self-pay | Admitting: Family Medicine

## 2021-08-09 LAB — BMP8+EGFR
BUN/Creatinine Ratio: 8 — ABNORMAL LOW (ref 10–24)
BUN: 6 mg/dL — ABNORMAL LOW (ref 10–36)
CO2: 24 mmol/L (ref 20–29)
Calcium: 9.3 mg/dL (ref 8.6–10.2)
Chloride: 99 mmol/L (ref 96–106)
Creatinine, Ser: 0.73 mg/dL — ABNORMAL LOW (ref 0.76–1.27)
Glucose: 82 mg/dL (ref 65–99)
Potassium: 4 mmol/L (ref 3.5–5.2)
Sodium: 137 mmol/L (ref 134–144)
eGFR: 86 mL/min/{1.73_m2} (ref >59)

## 2021-08-09 LAB — CBC WITH DIFFERENTIAL/PLATELET
Basophils Absolute: 0 10*3/uL (ref 0.0–0.2)
Basos: 0 %
EOS (ABSOLUTE): 0.5 10*3/uL — ABNORMAL HIGH (ref 0.0–0.4)
Eos: 7 %
Hematocrit: 38.9 % (ref 37.5–51.0)
Hemoglobin: 12.9 g/dL — ABNORMAL LOW (ref 13.0–17.7)
Immature Grans (Abs): 0 10*3/uL (ref 0.0–0.1)
Immature Granulocytes: 0 %
Lymphocytes Absolute: 2 10*3/uL (ref 0.7–3.1)
Lymphs: 31 %
MCH: 29.4 pg (ref 26.6–33.0)
MCHC: 33.2 g/dL (ref 31.5–35.7)
MCV: 89 fL (ref 79–97)
Monocytes Absolute: 0.5 10*3/uL (ref 0.1–0.9)
Monocytes: 8 %
Neutrophils Absolute: 3.3 10*3/uL (ref 1.4–7.0)
Neutrophils: 54 %
Platelets: 164 10*3/uL (ref 150–450)
RBC: 4.39 x10E6/uL (ref 4.14–5.80)
RDW: 13.2 % (ref 11.6–15.4)
WBC: 6.3 10*3/uL (ref 3.4–10.8)

## 2021-08-09 NOTE — Telephone Encounter (Signed)
I would try slow positional changes. If this is not helpful, can stop the flagyl.

## 2021-08-09 NOTE — Telephone Encounter (Signed)
Patient said he woke up during the night to use the restroom and was dizzy and again earlier today when he got up to the answer the phone.  He is concerned this may be coming from the Flagyl because he has taken Cipro in the past and never had any problem.  I explained to patient it could be possible it was coming from lying and sitting and then standing up but he wanted me to let you know and see what you think.  He is taking with food and is drinking a lot of water.

## 2021-08-10 NOTE — Telephone Encounter (Signed)
Pt aware of provider feedback and voiced understanding.

## 2021-08-11 ENCOUNTER — Encounter: Payer: Self-pay | Admitting: Family Medicine

## 2021-08-11 ENCOUNTER — Other Ambulatory Visit: Payer: Self-pay

## 2021-08-11 ENCOUNTER — Ambulatory Visit (INDEPENDENT_AMBULATORY_CARE_PROVIDER_SITE_OTHER): Payer: Medicare Other | Admitting: Family Medicine

## 2021-08-11 VITALS — BP 130/80 | HR 93 | Temp 97.9°F | Ht 72.0 in | Wt 162.0 lb

## 2021-08-11 DIAGNOSIS — Z7901 Long term (current) use of anticoagulants: Secondary | ICD-10-CM

## 2021-08-11 DIAGNOSIS — K5792 Diverticulitis of intestine, part unspecified, without perforation or abscess without bleeding: Secondary | ICD-10-CM

## 2021-08-11 DIAGNOSIS — I482 Chronic atrial fibrillation, unspecified: Secondary | ICD-10-CM | POA: Diagnosis not present

## 2021-08-11 LAB — COAGUCHEK XS/INR WAIVED
INR: 2.6 — ABNORMAL HIGH (ref 0.9–1.1)
Prothrombin Time: 31.7 s

## 2021-08-11 NOTE — Progress Notes (Signed)
Subjective: CC: Atrial fibrillation PCP: [name], DO HPI:Dalton Vaughan is a 87 y.o. male presenting to clinic today for:  1.  Atrial fibrillation/ Diverticulitis Compliant with Coumadin.  He was recently started on Cipro and Flagyl for diverticulitis.  It was recommended that he follow-up every week for the next 3 weeks while he is on the medication.  He denies any skipped heartbeats but wonders if his heart rate might be too high some days.  Continues to have intermittent dizziness that was more prevalent when he very first started the metronidazole but that has since gotten better.  He is seeing an ENT for this already and they did not have any additional recommendations.  He has meclizine on hand if needed.  Denies any bleeding.  No loose stools or fevers.  Left lower quadrant pain is improving but not resolved.   ROS: Per HPI  Allergies  Allergen Reactions   Paxil [Paroxetine Hcl] Palpitations   Eliquis [Apixaban] Other (See Comments)    dizziness   Penicillins Other (See Comments)    Did it involve swelling of the face/tongue/throat, SOB, or low BP? Unknown Did it involve sudden or severe rash/hives, skin peeling, or any reaction on the inside of your mouth or nose? Unknown Did you need to seek medical attention at a hospital or doctor's office? Unknown When did it last happen?      unk If all above answers are "NO", may proceed with cephalosporin use.    Pravachol [Pravastatin Sodium] Other (See Comments)    myalgias   Zetia [Ezetimibe] Other (See Comments)    myalgias   Vytorin [Ezetimibe-Simvastatin] Other (See Comments)    myalgias   Past Medical History:  Diagnosis Date   Anal fissure    Arthritis    Atrial fibrillation (HCC)    BPH (benign prostatic hypertrophy)    CAD (coronary artery disease)    Dr. Wynonia Lawman   Cataract    COVID-19    Depression 2004   Diverticulosis    GERD (gastroesophageal reflux disease)    History of TIAs    Hyperlipidemia     Hypertension    Hypertensive heart disease without CHF    Hypothyroidism    Internal hemorrhoids    Lumbar disc disease    Oral bleeding 08/20/2012   Following molar tooth extraction July, 2013   Pneumonia    Ruptured disk 1995   Shingles    Thyroid disease    Vitamin D deficiency    Vitreous detachment     Current Outpatient Medications:    amLODipine (NORVASC) 5 MG tablet, Take 1 tablet (5 mg total) by mouth daily., Disp: 30 tablet, Rfl: 0   Calcium Carb-Cholecalciferol (CALCIUM + D3) 600-200 MG-UNIT TABS, Take 1 tablet by mouth daily. , Disp: , Rfl:    Cholecalciferol (VITAMIN D-3) 5000 UNITS TABS, Take 1 tablet by mouth daily., Disp: , Rfl:    ciprofloxacin (CIPRO) 500 MG tablet, Take 1 tablet (500 mg total) by mouth 2 (two) times daily for 7 days., Disp: 14 tablet, Rfl: 0   doxazosin (CARDURA) 8 MG tablet, Take 1 tablet (8 mg total) by mouth at bedtime., Disp: 30 tablet, Rfl: 0   metroNIDAZOLE (FLAGYL) 500 MG tablet, Take 1 tablet (500 mg total) by mouth 2 (two) times daily., Disp: 14 tablet, Rfl: 0   mirtazapine (REMERON) 15 MG tablet, TAKE 1 TABLET AT BEDTIME, Disp: 90 tablet, Rfl: 3   Multiple Vitamin (MULTIVITAMIN) tablet, Take 1 tablet by mouth  daily., Disp: , Rfl:    ramipril (ALTACE) 10 MG capsule, Take 1 capsule (10 mg total) by mouth daily., Disp: 90 capsule, Rfl: 3   SIMETHICONE PO, Take by mouth as needed., Disp: , Rfl:    SYNTHROID 50 MCG tablet, TAKE 1 TABLET DAILY BEFORE BREAKFAST, Disp: 90 tablet, Rfl: 3   triamterene-hydrochlorothiazide (MAXZIDE-25) 37.5-25 MG tablet, Take 1 tablet by mouth daily., Disp: 90 tablet, Rfl: 3   vitamin C (ASCORBIC ACID) 500 MG tablet, Take 500 mg by mouth daily. , Disp: , Rfl:    warfarin (COUMADIN) 5 MG tablet, Take 1 tablet (65m) by mouth daily as directed, Disp: 30 tablet, Rfl: 3   XIIDRA 5 % SOLN, Apply 1 drop to eye as needed. 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, musicat [employer]     Right-handed      Caffeine use: none   Social Determinants of HRadio broadcast assistantStrain: Not on fComcastInsecurity: Not on file  Transportation Needs: Not on file  Physical Activity: Not on file  Stress: Not on file  Social Connections: Not on file  Intimate Partner Violence: Not on file   Family History  Problem Relation Age of Onset   Dementia Mother    Arthritis Mother    Cancer Brother        prostate   Asthma Sister    Heart disease Paternal Aunt    Heart disease Paternal Uncle    Hypertension Maternal Grandmother    CVA Maternal Grandmother    CVA Paternal Grandmother    Hepatitis C Son    Arthritis Son    Colon cancer Neg Hx    Colon polyps Neg Hx    Kidney disease Neg Hx    Esophageal cancer Neg Hx    Gallbladder disease Neg Hx    Diabetes Neg Hx     Objective: Office vital signs reviewed. BP 130/80   Pulse 93   Temp 97.9 F (36.6 C)   Ht 6' (1.829 m)   Wt 162 lb (73.5 kg)   SpO2 99%   BMI 21.97 kg/m   Physical Examination:  General: Awake, alert, well nourished, No acute  distress HEENT: Normal; sclera white.  Nontoxic Cardio: regular rate and rhythm, S1S2 heard, no murmurs appreciated Pulm:  clear to auscultation bilaterally, no wheezes, rhonchi or rales; normal work of breathing on room air.  Assessment/ Plan: 87 y.o. male   Chronic atrial fibrillation (HCC)  Chronic anticoagulation - Plan: CoaguChek XS/INR Waived  Diverticulitis  Rate and rhythm appear to be controlled.  His INR is therapeutic despite use of antibiotics.  Agree with close follow-up while he is on this medication.  As long as he stays stable within the next 2 checks he can go back out every 6 to 8-week checkups.  Orders Placed This Encounter  Procedures   CoaguChek XS/INR Waived   No orders of the defined types were placed in this encounter.    [name], DO [place] [phone]

## 2021-08-17 ENCOUNTER — Ambulatory Visit (INDEPENDENT_AMBULATORY_CARE_PROVIDER_SITE_OTHER): Payer: Medicare Other | Admitting: Family Medicine

## 2021-08-17 ENCOUNTER — Other Ambulatory Visit: Payer: Self-pay

## 2021-08-17 ENCOUNTER — Encounter: Payer: Self-pay | Admitting: Family Medicine

## 2021-08-17 VITALS — BP 132/77 | HR 93 | Temp 97.6°F | Ht 72.0 in | Wt 162.0 lb

## 2021-08-17 DIAGNOSIS — Z7901 Long term (current) use of anticoagulants: Secondary | ICD-10-CM

## 2021-08-17 DIAGNOSIS — I482 Chronic atrial fibrillation, unspecified: Secondary | ICD-10-CM

## 2021-08-17 LAB — COAGUCHEK XS/INR WAIVED
INR: 4.7 — ABNORMAL HIGH (ref 0.9–1.1)
Prothrombin Time: 55.8 s

## 2021-08-17 LAB — POCT INR: INR: 4.7 — AB (ref 2.0–3.0)

## 2021-08-17 NOTE — Patient Instructions (Signed)
Hold dose tonight and take 1/2 tablet tomorrow night, resume normal dose after that. Recheck INR in one week.

## 2021-08-17 NOTE — Progress Notes (Signed)
Subjective:  Patient ID: Dalton Vaughan, male    DOB: February 14, 1929, 87 y.o.   MRN: [id number]  Patient Care Team: [name], DO as PCP - General (Family Medicine) Raynelle Bring, MD as Consulting Physician (Urology) Pyrtle, Lajuan Lines, MD as Consulting Physician (Gastroenterology) Clent Jacks, MD as Consulting Physician (Ophthalmology) Loni Beckwith Neita Goodnight., MD as Referring Physician (Cardiology)   Chief Complaint:  Atrial Fibrillation   HPI: Dalton Vaughan is a 87 y.o. male presenting on 08/17/2021 for Atrial Fibrillation   Pt presents today INR recheck. Recent antibiotic therapy so INR has been monitored more frequently. He completed antibiotics Tuesday morning. He denies any abnormal bleeding or bruising. Denies palpitations, chest pain, shortness of breath, leg swelling, or orthopnea.   Atrial Fibrillation Presents for follow-up visit. Symptoms are negative for an AICD problem, bradycardia, chest pain, dizziness, hemodynamic instability, hypertension, hypotension, pacemaker problem, palpitations, shortness of breath, syncope, tachycardia and weakness. The symptoms have been stable. Past medical history includes atrial fibrillation.    Relevant past medical, surgical, family, and social history reviewed and updated as indicated.  Allergies and medications reviewed and updated. Data reviewed: Chart in Epic.   Past Medical History:  Diagnosis Date   Anal fissure    Arthritis    Atrial fibrillation (HCC)    BPH (benign prostatic hypertrophy)    CAD (coronary artery disease)    Dr. Wynonia Lawman   Cataract    COVID-19    Depression 2004   Diverticulosis    GERD (gastroesophageal reflux disease)    History of TIAs    Hyperlipidemia    Hypertension    Hypertensive heart disease without CHF    Hypothyroidism    Internal hemorrhoids    Lumbar disc disease    Oral bleeding 08/20/2012   Following molar tooth extraction July, 2013   Pneumonia    Ruptured disk 1995   Shingles     Thyroid disease    Vitamin D deficiency    Vitreous detachment     Past Surgical History:  Procedure Laterality Date   bilateral cataract surgery  6/07   CARDIAC CATHETERIZATION     CORONARY ARTERY BYPASS GRAFT  12/99    Dr. 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   Calcium Carb-Cholecalciferol (CALCIUM + D3) 600-200  MG-UNIT TABS Take 1 tablet by mouth daily.    Cholecalciferol (VITAMIN D-3) 5000 UNITS TABS Take 1 tablet by mouth daily.   doxazosin (CARDURA) 8 MG tablet Take 1 tablet (8 mg total) by mouth at bedtime.   metroNIDAZOLE (FLAGYL) 500 MG tablet Take 1 tablet (500 mg total) by mouth 2 (two) times daily.   mirtazapine (REMERON) 15 MG tablet TAKE 1 TABLET AT BEDTIME   Multiple Vitamin (MULTIVITAMIN) tablet Take 1 tablet by mouth daily.   ramipril (ALTACE) 10 MG capsule Take 1 capsule (10 mg total) by mouth daily.   SIMETHICONE PO Take by mouth as needed.   SYNTHROID 50 MCG tablet TAKE 1 TABLET DAILY BEFORE BREAKFAST   triamterene-hydrochlorothiazide (MAXZIDE-25) 37.5-25 MG tablet Take 1 tablet by mouth daily.   vitamin C (ASCORBIC ACID) 500 MG tablet Take 500 mg by mouth daily.    warfarin (COUMADIN) 5 MG tablet Take 1 tablet (35m) by mouth daily as directed   XIIDRA 5 % SOLN Apply 1 drop to eye as needed.    No facility-administered encounter medications on file as of 08/17/2021.    Allergies  Allergen Reactions   Paxil [Paroxetine Hcl] Palpitations   Eliquis [Apixaban] Other (See Comments)    dizziness   Penicillins Other (See Comments)    Did it involve swelling of the face/tongue/throat, SOB, or low BP? Unknown Did it involve sudden or severe rash/hives, skin peeling, or any reaction on the inside of your mouth or nose? Unknown Did you need to seek medical attention at a hospital or doctor's office? Unknown When did it last happen?      unk If all above answers are "NO", may proceed with cephalosporin use.    Pravachol [Pravastatin Sodium] Other (See Comments)    myalgias   Zetia [Ezetimibe] Other (See Comments)    myalgias   Vytorin [Ezetimibe-Simvastatin] Other (See Comments)    myalgias    Review of Systems  Constitutional:  Negative for activity change, appetite change, chills, diaphoresis, fatigue, fever and unexpected weight change.  HENT: Negative.    Eyes: Negative.    Respiratory:  Negative for cough, chest tightness and shortness of breath.   Cardiovascular:  Negative for chest pain, palpitations, leg swelling and syncope.  Gastrointestinal:  Negative for abdominal pain, anal bleeding, blood in stool, constipation, diarrhea, nausea and vomiting.  Endocrine: Negative.   Genitourinary:  Negative for decreased urine volume, difficulty urinating, dysuria, frequency, hematuria and urgency.  Musculoskeletal:  Negative for arthralgias and myalgias.  Skin: Negative.   Allergic/Immunologic: Negative.   Neurological:  Negative for dizziness, weakness and headaches.  Hematological: Negative.  Negative for adenopathy. Does not bruise/bleed easily.  Psychiatric/Behavioral:  Negative for  confusion, hallucinations, sleep disturbance and suicidal ideas.   All other systems reviewed and are negative.      Objective:  BP 132/77   Pulse 93   Temp 97.6 F (36.4 C) (Temporal)   Ht 6' (1.829 m)   Wt 162 lb (73.5 kg)   SpO2 98%   BMI 21.97 kg/m    Wt Readings from Last 3 Encounters:  08/17/21 162 lb (73.5 kg)  08/11/21 162 lb (73.5 kg)  08/08/21 162 lb (73.5 kg)    Physical Exam Vitals and nursing note reviewed.  Constitutional:      General: He is not in acute distress.    Appearance: Normal appearance. He is normal weight. He is not ill-appearing or diaphoretic.  HENT:     Head: Normocephalic and atraumatic.  Eyes:     Conjunctiva/sclera: Conjunctivae normal.     Pupils: Pupils are equal, round, and reactive to light.  Cardiovascular:     Rate and Rhythm: Normal rate. Rhythm irregularly irregular.     Pulses: Normal pulses.     Heart sounds: Normal heart sounds.     Comments: Pacemaker pocket normal Pulmonary:     Effort: Pulmonary effort is normal.     Breath sounds: Normal breath sounds.  Abdominal:     General: Abdomen is flat. Bowel sounds are normal.     Palpations: Abdomen is soft.  Musculoskeletal:     Right lower leg: No edema.      Left lower leg: No edema.  Skin:    General: Skin is warm and dry.     Capillary Refill: Capillary refill takes less than 2 seconds.  Neurological:     General: No focal deficit present.     Mental Status: He is alert and oriented to person, place, and time.  Psychiatric:        Mood and Affect: Mood normal.        Behavior: Behavior normal.        Thought Content: Thought content normal.        Judgment: Judgment normal.    Results for orders placed or performed in visit on 08/17/21  POCT INR  Result Value Ref Range   INR 4.7 (A) 2.0 - 3.0       Pertinent labs & imaging results that were available during my care of the patient were reviewed by me and considered in my medical decision making.  Assessment & Plan:  Sheri was seen today for atrial fibrillation.  Diagnoses and all orders for this visit:  Chronic atrial fibrillation (HCC) Chronic anticoagulation INR 4.7 today due to recent antibiotic therapy. Hold tonights dose and take 1/2 tablet tomorrow night. Recheck INR in one week, if back to goal can resume every 4-6 week checks. No abnormal bleeding or bruising. Chronic A-Fib with controlled rated, continue current regimen.  -     CoaguChek XS/INR Waived -     POCT INR    Continue all other maintenance medications.  Follow up plan: Return in about 1 week (around 08/24/2021), or if symptoms worsen or fail to improve, for INR.   Continue healthy lifestyle choices, including diet (rich in fruits, vegetables, and lean proteins, and low in salt and simple carbohydrates) and exercise (at least 30 minutes of moderate physical activity daily).   The above assessment and management plan was discussed with the patient. The patient verbalized understanding of and has agreed to the management plan. Patient is aware to call the clinic if they develop  any new symptoms or if symptoms persist or worsen. Patient is aware when to return to the clinic for a follow-up visit. Patient educated  on when it is appropriate to go to the emergency department.   Monia Pouch, FNP-C [facility] [phone]

## 2021-08-22 ENCOUNTER — Other Ambulatory Visit: Payer: Self-pay | Admitting: Family Medicine

## 2021-08-25 ENCOUNTER — Other Ambulatory Visit: Payer: Self-pay

## 2021-08-25 ENCOUNTER — Encounter: Payer: Self-pay | Admitting: Family Medicine

## 2021-08-25 ENCOUNTER — Ambulatory Visit (INDEPENDENT_AMBULATORY_CARE_PROVIDER_SITE_OTHER): Payer: Medicare Other | Admitting: Family Medicine

## 2021-08-25 VITALS — BP 134/77 | HR 90 | Temp 97.6°F | Ht 72.0 in | Wt 160.0 lb

## 2021-08-25 DIAGNOSIS — I482 Chronic atrial fibrillation, unspecified: Secondary | ICD-10-CM | POA: Diagnosis not present

## 2021-08-25 DIAGNOSIS — Z7901 Long term (current) use of anticoagulants: Secondary | ICD-10-CM

## 2021-08-25 DIAGNOSIS — I10 Essential (primary) hypertension: Secondary | ICD-10-CM

## 2021-08-25 LAB — COAGUCHEK XS/INR WAIVED
INR: 3.9 — ABNORMAL HIGH (ref 0.9–1.1)
Prothrombin Time: 47.3 s

## 2021-08-25 LAB — HEMOGLOBIN, FINGERSTICK: Hemoglobin: 15.2 g/dL (ref 12.6–17.7)

## 2021-08-25 LAB — POCT INR: INR: 3.9 — AB (ref 2–3)

## 2021-08-25 MED ORDER — WARFARIN SODIUM 5 MG PO TABS: ORAL_TABLET | ORAL | 1 refills | Status: DC

## 2021-08-25 MED ORDER — AMLODIPINE BESYLATE 5 MG PO TABS: 5.0000 mg | ORAL_TABLET | Freq: Every day | ORAL | 1 refills | Status: DC

## 2021-08-25 NOTE — Progress Notes (Signed)
Subjective: [name] PCP: [name], DO HPI:Dalton Vaughan is a 87 y.o. male presenting to clinic today for:  1. AFib/ Supratherapeutic INR INR supratherapeutic last visit.  Thought to be secondary to recent abx for diverticulitis.  Instructed to hold 1 day and take only 1/2 tablet the following day then resume normal 13m daily.  He denies any bleeding.  He does report ongoing intermittent dizziness that is chronic and stable.   ROS: Per HPI  Allergies  Allergen Reactions   Paxil [Paroxetine Hcl] Palpitations   Eliquis [Apixaban] Other (See Comments)    dizziness   Penicillins Other (See Comments)    Did it involve swelling of the face/tongue/throat, SOB, or low BP? Unknown Did it involve sudden or severe rash/hives, skin peeling, or any reaction on the inside of your mouth or nose? Unknown Did you need to seek medical attention at a hospital or doctor's office? Unknown When did it last happen?      unk If all above answers are "NO", may proceed with cephalosporin use.    Pravachol [Pravastatin Sodium] Other (See Comments)    myalgias   Zetia [Ezetimibe] Other (See Comments)    myalgias   Vytorin [Ezetimibe-Simvastatin] Other (See Comments)    myalgias   Past Medical History:  Diagnosis Date   Anal fissure    Arthritis    Atrial fibrillation (HCC)    BPH (benign prostatic hypertrophy)    CAD (coronary artery disease)    Dr. TWynonia Lawman  Cataract    COVID-19    Depression 2004   Diverticulosis    GERD (gastroesophageal reflux disease)    History of TIAs    Hyperlipidemia    Hypertension    Hypertensive heart disease without CHF    Hypothyroidism    Internal hemorrhoids    Lumbar disc disease    Oral bleeding 08/20/2012   Following molar tooth extraction July, 2013   Pneumonia    Ruptured disk 1995   Shingles    Thyroid disease    Vitamin D deficiency    Vitreous detachment     Current Outpatient Medications:    amLODipine (NORVASC) 5 MG tablet, Take 1  tablet (5 mg total) by mouth daily., Disp: 30 tablet, Rfl: 0   Calcium Carb-Cholecalciferol (CALCIUM + D3) 600-200 MG-UNIT TABS, Take 1 tablet by mouth daily. , Disp: , Rfl:    Cholecalciferol (VITAMIN D-3) 5000 UNITS TABS, Take 1 tablet by mouth daily., Disp: , Rfl:    doxazosin (CARDURA) 8 MG tablet, TAKE 1 TABLET AT BEDTIME, Disp: 90 tablet, Rfl: 3   metroNIDAZOLE (FLAGYL) 500 MG tablet, Take 1 tablet (500 mg total) by mouth 2 (two) times daily., Disp: 14 tablet, Rfl: 0   mirtazapine (REMERON) 15 MG tablet, TAKE 1 TABLET AT BEDTIME, Disp: 90 tablet, Rfl: 3   Multiple Vitamin (MULTIVITAMIN) tablet, Take 1 tablet by mouth daily., Disp: , Rfl:    ramipril (ALTACE) 10 MG capsule, Take 1 capsule (10 mg total) by mouth daily., Disp: 90 capsule, Rfl: 3   SIMETHICONE PO, Take by mouth as needed., Disp: , Rfl:    SYNTHROID 50 MCG tablet, TAKE 1 TABLET DAILY BEFORE BREAKFAST, Disp: 90 tablet, Rfl: 3   triamterene-hydrochlorothiazide (MAXZIDE-25) 37.5-25 MG tablet, Take 1 tablet by mouth daily., Disp: 90 tablet, Rfl: 3   vitamin C (ASCORBIC ACID) 500 MG tablet, Take 500 mg by mouth daily. , Disp: , Rfl:    warfarin (COUMADIN) 5 MG tablet, Take 1 tablet (548m  by mouth daily as directed, Disp: 30 tablet, Rfl: 3   XIIDRA 5 % SOLN, Apply 1 drop to eye as needed. 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, music at [employer]      Right-handed      Caffeine use: none   Social Determinants of Radio broadcast assistant Strain: Not on [employer] Insecurity: Not on file  Transportation Needs: Not on file  Physical Activity: Not on file  Stress: Not on file  Social Connections: Not on file  Intimate Partner Violence: Not on file   Family History  Problem Relation Age of Onset   Dementia Mother    Arthritis Mother    Cancer Brother        prostate   Asthma Sister    Heart disease Paternal Aunt    Heart disease Paternal Uncle    Hypertension Maternal Grandmother    CVA Maternal Grandmother    CVA Paternal Grandmother    Hepatitis C Son    Arthritis Son    Colon cancer Neg Hx    Colon polyps Neg Hx    Kidney disease Neg Hx    Esophageal cancer Neg Hx    Gallbladder disease Neg Hx    Diabetes Neg Hx     Objective: Office vital signs reviewed. BP 134/77   Pulse 90   Temp 97.6 F (36.4 C)   Ht 6' (1.829 m)   Wt 160 lb (72.6 kg)   SpO2 95%   BMI 21.70 kg/m   Physical Examination:  General: Awake, alert, well nourished, well-appearing elderly male.  No acute distress HEENT: Normal; no nystagmus.  TMs intact bilaterally.  Scant cerumen in external auditory canal Cardio: Regular rate and rhythm.  S1S2 heard, no murmurs appreciated Pulm: clear to auscultation bilaterally, no wheezes, rhonchi or rales; normal work of breathing on room air MSK: Ambulating independently  Assessment/ Plan: 87 y.o. male   Chronic atrial fibrillation ([place]) - Plan: CoaguChek XS/INR Waived, Hemoglobin, fingerstick, POCT INR  Chronic anticoagulation - Plan: CoaguChek XS/INR Waived,  Hemoglobin, fingerstick, POCT INR  INR supratherapeutic.  That this is related to recent antibiotics for diverticulitis.  Though INR improving from previous checkup.  I would like him to skip today and tomorrow's dose and then resume half dose on Sunday.  He may then resume full tablet until he is reassessed in 10 days.  He is aware of appointment date and time.  No orders of the defined types were placed in this encounter.  No orders of the defined types were placed in this encounter.    [name], DO [place] [phone]

## 2021-08-25 NOTE — Patient Instructions (Signed)
Skip today, tomorrow's coumadin.  Only HALF on Sunday. Then take 1 tablet daily as directed.

## 2021-08-31 DIAGNOSIS — I70203 Unspecified atherosclerosis of native arteries of extremities, bilateral legs: Secondary | ICD-10-CM | POA: Diagnosis not present

## 2021-08-31 DIAGNOSIS — B351 Tinea unguium: Secondary | ICD-10-CM | POA: Diagnosis not present

## 2021-08-31 DIAGNOSIS — L84 Corns and callosities: Secondary | ICD-10-CM | POA: Diagnosis not present

## 2021-08-31 DIAGNOSIS — M79676 Pain in unspecified toe(s): Secondary | ICD-10-CM | POA: Diagnosis not present

## 2021-09-04 ENCOUNTER — Other Ambulatory Visit: Payer: Self-pay

## 2021-09-04 ENCOUNTER — Encounter: Payer: Self-pay | Admitting: Family Medicine

## 2021-09-04 ENCOUNTER — Other Ambulatory Visit: Payer: Self-pay | Admitting: Family Medicine

## 2021-09-04 ENCOUNTER — Ambulatory Visit (INDEPENDENT_AMBULATORY_CARE_PROVIDER_SITE_OTHER): Payer: Medicare Other | Admitting: Family Medicine

## 2021-09-04 VITALS — BP 120/71 | HR 76 | Temp 97.4°F | Ht 72.0 in | Wt 161.0 lb

## 2021-09-04 DIAGNOSIS — Z7901 Long term (current) use of anticoagulants: Secondary | ICD-10-CM

## 2021-09-04 DIAGNOSIS — R5383 Other fatigue: Secondary | ICD-10-CM

## 2021-09-04 DIAGNOSIS — R351 Nocturia: Secondary | ICD-10-CM | POA: Diagnosis not present

## 2021-09-04 DIAGNOSIS — I482 Chronic atrial fibrillation, unspecified: Secondary | ICD-10-CM

## 2021-09-04 LAB — COAGUCHEK XS/INR WAIVED
INR: 2.3 — ABNORMAL HIGH (ref 0.9–1.1)
Prothrombin Time: 27.5 s

## 2021-09-04 LAB — POCT INR: INR: 2.3 (ref 2–3)

## 2021-09-04 NOTE — Progress Notes (Signed)
Subjective: CC: A. fib PCP: [name], DO HPI:Dalton Vaughan is a 87 y.o. male presenting to clinic today for:  1.  Atrial fibrillation, GOAL INR 2-3. Patient was seen about a week ago for atrial fibrillation.  He was noted to have a supra therapeutic INR at last visit of 3.9.  He was instructed to skip 2 days worth of medication and then resume half dose Sunday.  He was then instructed to resume full tablet daily until he was reassessed today.  His supratherapeutic INR was thought to be secondary to recent antibiotic use for diverticulitis.  He had previously been controlled on that regimen.  He has not had any issues with bleeding and has been compliant with the 1 tablet daily  2.  Nocturia Patient reports urinating up to 4 times per night.  He feels that this is related to amlodipine use in the morning.  He takes his triamterene-hydrochlorothiazide each morning as well.  He does admit that he tries to catch up on fluid intake towards the end of the afternoon and this may be impacting.  He has been previously treated with melatonin for sleep and this was helpful but he discontinued it.  He is compliant with Remeron.  He does admit to feeling low energy in the daytime due to this   ROS: Per HPI  Allergies  Allergen Reactions   Paxil [Paroxetine Hcl] Palpitations   Eliquis [Apixaban] Other (See Comments)    dizziness   Penicillins Other (See Comments)    Did it involve swelling of the face/tongue/throat, SOB, or low BP? Unknown Did it involve sudden or severe rash/hives, skin peeling, or any reaction on the inside of your mouth or nose? Unknown Did you need to seek medical attention at a hospital or doctor's office? Unknown When did it last happen?      unk If all above answers are "NO", may proceed with cephalosporin use.    Pravachol [Pravastatin Sodium] Other (See Comments)    myalgias   Zetia [Ezetimibe] Other (See Comments)    myalgias   Vytorin [Ezetimibe-Simvastatin]  Other (See Comments)    myalgias   Past Medical History:  Diagnosis Date   Anal fissure    Arthritis    Atrial fibrillation (HCC)    BPH (benign prostatic hypertrophy)    CAD (coronary artery disease)    Dr. Wynonia Lawman   Cataract    COVID-19    Depression 2004   Diverticulosis    GERD (gastroesophageal reflux disease)    History of TIAs    Hyperlipidemia    Hypertension    Hypertensive heart disease without CHF    Hypothyroidism    Internal hemorrhoids    Lumbar disc disease    Oral bleeding 08/20/2012   Following molar tooth extraction July, 2013   Pneumonia    Ruptured disk 1995   Shingles    Thyroid disease    Vitamin D deficiency    Vitreous detachment     Current Outpatient Medications:    amLODipine (NORVASC) 5 MG tablet, Take 1 tablet (5 mg total) by mouth daily., Disp: 90 tablet, Rfl: 1   Calcium Carb-Cholecalciferol (CALCIUM + D3) 600-200 MG-UNIT TABS, Take 1 tablet by mouth daily. , Disp: , Rfl:    Cholecalciferol (VITAMIN D-3) 5000 UNITS TABS, Take 1 tablet by mouth daily., Disp: , Rfl:    doxazosin (CARDURA) 8 MG tablet, TAKE 1 TABLET AT BEDTIME, Disp: 90 tablet, Rfl: 3   metroNIDAZOLE (FLAGYL) 500 MG  tablet, Take 1 tablet (500 mg total) by mouth 2 (two) times daily., Disp: 14 tablet, Rfl: 0   mirtazapine (REMERON) 15 MG tablet, TAKE 1 TABLET AT BEDTIME, Disp: 90 tablet, Rfl: 3   Multiple Vitamin (MULTIVITAMIN) tablet, Take 1 tablet by mouth daily., Disp: , Rfl:    ramipril (ALTACE) 10 MG capsule, Take 1 capsule (10 mg total) by mouth daily., Disp: 90 capsule, Rfl: 3   SIMETHICONE PO, Take by mouth as needed., Disp: , Rfl:    SYNTHROID 50 MCG tablet, TAKE 1 TABLET DAILY BEFORE BREAKFAST, Disp: 90 tablet, Rfl: 3   triamterene-hydrochlorothiazide (MAXZIDE-25) 37.5-25 MG tablet, Take 1 tablet by mouth daily., Disp: 90 tablet, Rfl: 3   vitamin C (ASCORBIC ACID) 500 MG tablet, Take 500 mg by mouth daily. , Disp: , Rfl:    warfarin (COUMADIN) 5 MG tablet, Take 1 tablet  (62m) by mouth daily as directed, Disp: 90 tablet, Rfl: 1   XIIDRA 5 % SOLN, Apply 1 drop to eye as needed. 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, musicat [employer]     Right-handed      Caffeine use: none   Social Determinants of HRadio broadcast assistantStrain: Not on fComcastInsecurity: Not on file  Transportation Needs: Not on file  Physical Activity: Not on file  Stress: Not on file  Social Connections: Not on file  Intimate Partner Violence: Not on file   Family History  Problem Relation Age of Onset   Dementia Mother    Arthritis Mother    Cancer Brother        prostate   Asthma Sister    Heart disease Paternal Aunt    Heart disease Paternal Uncle    Hypertension Maternal Grandmother    CVA Maternal Grandmother    CVA Paternal Grandmother    Hepatitis C Son    Arthritis Son    Colon cancer Neg Hx    Colon polyps Neg Hx    Kidney disease Neg Hx    Esophageal cancer Neg Hx    Gallbladder disease Neg Hx    Diabetes Neg Hx     Objective: Office vital  signs reviewed. BP 120/71   Pulse 76   Temp (!) 97.4 F (36.3 C)   Ht 6' (1.829  m)   Wt 161 lb (73 kg)   SpO2 97%   BMI 21.84 kg/m   Physical Examination:  General: Awake, alert, well nourished, No acute distress Cardio: regular rate and rhythm, S1S2 heard, no murmurs appreciated Pulm: clear to auscultation bilaterally, mild expiratory wheezes noted at the bilateral apices, no rhonchi or rales; normal work of breathing on room air  Assessment/ Plan: 87 y.o. male   Chronic atrial fibrillation ([place]) - Plan: CoaguChek XS/INR Waived, POCT INR  Chronic anticoagulation - Plan: CoaguChek XS/INR Waived, POCT INR  Nocturia  Decreased energy  INR is therapeutic at 2.2 today.  Continue 5 mg daily as prescribed  Discussed avoiding fluids for 2 hours prior to bed.  I think that it is fine to switch amlodipine to nighttime if he feels that this would be impactful.  He is on treatment for prostate.  Could consider medication for overactive bladder if needed.  I think that melatonin use would be fine if he needed an additional sleep aid.  We also discussed alternatives including Belsomra, Rozerem and trazodone today  We will plan for a thyroid lab check, CBC with next lab draw to make sure there is no metabolic etiology of decreased energy.  Though I suspect is related to situation as above  Orders Placed This Encounter  Procedures   CoaguChek XS/INR Waived   No orders of the defined types were placed in this encounter.   Plan for flu vaccine at next visit  [name], [place] [phone]

## 2021-09-04 NOTE — Patient Instructions (Signed)
Ok to switch amlodipine to night time Avoid drinking fluids 2 hours before bedtime to reduce urine frequency at night Melatonin 1-18m at bedtime ok for sleep

## 2021-09-05 ENCOUNTER — Ambulatory Visit (INDEPENDENT_AMBULATORY_CARE_PROVIDER_SITE_OTHER): Payer: Medicare Other

## 2021-09-05 VITALS — Ht 72.0 in | Wt 161.0 lb

## 2021-09-05 DIAGNOSIS — Z0000 Encounter for general adult medical examination without abnormal findings: Secondary | ICD-10-CM

## 2021-09-05 NOTE — Patient Instructions (Signed)
Dalton Vaughan , Thank you for taking time to come for your Medicare Wellness Visit. I appreciate your ongoing commitment to your health goals. Please review the following plan we discussed and let me know if I can assist you in the future.   Screening recommendations/referrals: Colonoscopy: Done 03/24/2015 - No repeat required Recommended yearly ophthalmology/optometry visit for glaucoma screening and checkup Recommended yearly dental visit for hygiene and checkup  Vaccinations: Influenza vaccine: Done 10/11/2020 - Repeat annually  Pneumococcal vaccine: Done 01/04/2014 & 11/15/2015 Tdap vaccine: Due. Every 10 years Shingles vaccine: Done 01/26/2019 & 05/06/2019   Covid-19: Done 02/16/2020 & 03/15/2020 - due for booster  Advanced directives: Please bring a copy of your health care power of attorney and living will to the office to be added to your chart at your convenience.   Conditions/risks identified: Keep up the great work!   Next appointment: Follow up in one year for your annual wellness visit.   Preventive Care 68 Years and Older, Male  Preventive care refers to lifestyle choices and visits with your health care provider that can promote health and wellness. What does preventive care include? A yearly physical exam. This is also called an annual well check. Dental exams once or twice a year. Routine eye exams. Ask your health care provider how often you should have your eyes checked. Personal lifestyle choices, including: Daily care of your teeth and gums. Regular physical activity. Eating a healthy diet. Avoiding tobacco and drug use. Limiting alcohol use. Practicing safe sex. Taking low doses of aspirin every day. Taking vitamin and mineral supplements as recommended by your health care provider. What happens during an annual well check? The services and screenings done by your health care provider during your annual well check will depend on your age, overall health, lifestyle  risk factors, and family history of disease. Counseling  Your health care provider may ask you questions about your: Alcohol use. Tobacco use. Drug use. Emotional well-being. Home and relationship well-being. Sexual activity. Eating habits. History of falls. Memory and ability to understand (cognition). Work and work Statistician. Screening  You may have the following tests or measurements: Height, weight, and BMI. Blood pressure. Lipid and cholesterol levels. These may be checked every 5 years, or more frequently if you are over 27 years old. Skin check. Lung cancer screening. You may have this screening every year starting at age 85 if you have a 30-pack-year history of smoking and currently smoke or have quit within the past 15 years. Fecal occult blood test (FOBT) of the stool. You may have this test every year starting at age 85. Flexible sigmoidoscopy or colonoscopy. You may have a sigmoidoscopy every 5 years or a colonoscopy every 10 years starting at age 85. Prostate cancer screening. Recommendations will vary depending on your family history and other risks. Hepatitis C blood test. Hepatitis B blood test. Sexually transmitted disease (STD) testing. Diabetes screening. This is done by checking your blood sugar (glucose) after you have not eaten for a while (fasting). You may have this done every 1-3 years. Abdominal aortic aneurysm (AAA) screening. You may need this if you are a current or former smoker. Osteoporosis. You may be screened starting at age 85 if you are at high risk. Talk with your health care provider about your test results, treatment options, and if necessary, the need for more tests. Vaccines  Your health care provider may recommend certain vaccines, such as: Influenza vaccine. This is recommended every year. Tetanus, diphtheria, and  acellular pertussis (Tdap, Td) vaccine. You may need a Td booster every 10 years. Zoster vaccine. You may need this after age  85. Pneumococcal 13-valent conjugate (PCV13) vaccine. One dose is recommended after age 85. Pneumococcal polysaccharide (PPSV23) vaccine. One dose is recommended after age 85. Talk to your health care provider about which screenings and vaccines you need and how often you need them. This information is not intended to replace advice given to you by your health care provider. Make sure you discuss any questions you have with your health care provider. Document Released: 12/30/2015 Document Revised: 08/22/2016 Document Reviewed: 10/04/2015 Elsevier Interactive Patient Education  2017 [place] Prevention in the Home Falls can cause injuries. They can happen to people of all ages. There are many things you can do to make your home safe and to help prevent falls. What can I do on the outside of my home? Regularly fix the edges of walkways and driveways and fix any cracks. Remove anything that might make you trip as you walk through a door, such as a raised step or threshold. Trim any bushes or trees on the path to your home. Use bright outdoor lighting. Clear any walking paths of anything that might make someone trip, such as rocks or tools. Regularly check to see if handrails are loose or broken. Make sure that both sides of any steps have handrails. Any raised decks and porches should have guardrails on the edges. Have any leaves, snow, or ice cleared regularly. Use sand or salt on walking paths during winter. Clean up any spills in your garage right away. This includes oil or grease spills. What can I do in the bathroom? Use night lights. Install grab bars by the toilet and in the tub and shower. Do not use towel bars as grab bars. Use non-skid mats or decals in the tub or shower. If you need to sit down in the shower, use a plastic, non-slip stool. Keep the floor dry. Clean up any water that spills on the floor as soon as it happens. Remove soap buildup in the tub or shower  regularly. Attach bath mats securely with double-sided non-slip rug tape. Do not have throw rugs and other things on the floor that can make you trip. What can I do in the bedroom? Use night lights. Make sure that you have a light by your bed that is easy to reach. Do not use any sheets or blankets that are too big for your bed. They should not hang down onto the floor. Have a firm chair that has side arms. You can use this for support while you get dressed. Do not have throw rugs and other things on the floor that can make you trip. What can I do in the kitchen? Clean up any spills right away. Avoid walking on wet floors. Keep items that you use a lot in easy-to-reach places. If you need to reach something above you, use a strong step stool that has a grab bar. Keep electrical cords out of the way. Do not use floor polish or wax that makes floors slippery. If you must use wax, use non-skid floor wax. Do not have throw rugs and other things on the floor that can make you trip. What can I do with my stairs? Do not leave any items on the stairs. Make sure that there are handrails on both sides of the stairs and use them. Fix handrails that are broken or loose. Make sure that  handrails are as long as the stairways. Check any carpeting to make sure that it is firmly attached to the stairs. Fix any carpet that is loose or worn. Avoid having throw rugs at the top or bottom of the stairs. If you do have throw rugs, attach them to the floor with carpet tape. Make sure that you have a light switch at the top of the stairs and the bottom of the stairs. If you do not have them, ask someone to add them for you. What else can I do to help prevent falls? Wear shoes that: Do not have high heels. Have rubber bottoms. Are comfortable and fit you well. Are closed at the toe. Do not wear sandals. If you use a stepladder: Make sure that it is fully opened. Do not climb a closed stepladder. Make sure that  both sides of the stepladder are locked into place. Ask someone to hold it for you, if possible. Clearly mark and make sure that you can see: Any grab bars or handrails. First and last steps. Where the edge of each step is. Use tools that help you move around (mobility aids) if they are needed. These include: Canes. Walkers. Scooters. Crutches. Turn on the lights when you go into a dark area. Replace any light bulbs as soon as they burn out. Set up your furniture so you have a clear path. Avoid moving your furniture around. If any of your floors are uneven, fix them. If there are any pets around you, be aware of where they are. Review your medicines with your doctor. Some medicines can make you feel dizzy. This can increase your chance of falling. Ask your doctor what other things that you can do to help prevent falls. This information is not intended to replace advice given to you by your health care provider. Make sure you discuss any questions you have with your health care provider. Document Released: 09/29/2009 Document Revised: 05/10/2016 Document Reviewed: 01/07/2015 Elsevier Interactive Patient Education  2017 [employer].

## 2021-09-05 NOTE — Progress Notes (Signed)
Subjective:   Dalton Vaughan is a 87 y.o. male who presents for Medicare Annual/Subsequent preventive examination.  Virtual Visit via Telephone Note  I connected with  Dalton Vaughan on 09/05/21 at 11:15 AM EDT by telephone and verified that I am speaking with the correct person using two identifiers.  Location: Patient: Home Provider: [facility] Persons participating in the virtual visit: patient/Nurse Health Advisor   I discussed the limitations, risks, security and privacy concerns of performing an evaluation and management service by telephone and the availability of in person appointments. The patient expressed understanding and agreed to proceed.  Interactive audio and video telecommunications were attempted between this nurse and patient, however failed, due to patient having technical difficulties OR patient did not have access to video capability.  We continued and completed visit with audio only.  Some vital signs may be absent or patient reported.   Sidda Humm E Miley Lindon, LPN   Review of Systems     Cardiac Risk Factors include: advanced age (>31mn, >>51women);male gender;dyslipidemia;hypertension;Other (see comment), Risk factor comments: A.Fib, CAD, hx of TIA, s/p pacemaker     Objective:    Today's Vitals   09/05/21 1053  Weight: 161 lb (73 kg)  Height: 6' (1.829 m)   Body mass index is 21.84 kg/m.  Advanced Directives 09/05/2021 02/01/2021 04/13/2020 01/12/2020 09/16/2019 09/15/2019 08/16/2019  Does Patient Have a Medical Advance Directive? Yes Yes Yes Yes No No No  Type of [place] Does patient want to make changes to medical advance directive? - - - - - - -  Copy of HS[place] Chart? No - copy requested - - - - - -  Would patient like information on creating a medical advance directive? - - - - No - Patient declined No - Patient declined No - Patient declined   Pre-existing out of facility DNR order (yellow form or pink MOST form) - - - - - - -    Current Medications (verified) Outpatient Encounter Medications as of 09/05/2021  Medication Sig   amLODipine (NORVASC) 5 MG tablet Take 1 tablet (5 mg total) by mouth daily.   Calcium Carb-Cholecalciferol (CALCIUM + D3) 600-200 MG-UNIT TABS Take 1 tablet by mouth daily.    Cholecalciferol (VITAMIN D-3) 5000 UNITS TABS Take 1 tablet by mouth daily.   doxazosin (CARDURA) 8 MG tablet TAKE 1 TABLET AT BEDTIME   mirtazapine (REMERON) 15 MG tablet TAKE 1 TABLET AT BEDTIME   Multiple Vitamin (MULTIVITAMIN) tablet Take 1 tablet by mouth daily.   ramipril (ALTACE) 10 MG capsule Take 1 capsule (10 mg total) by mouth daily.   SIMETHICONE PO Take by mouth as needed.   SYNTHROID 50 MCG tablet TAKE 1 TABLET DAILY BEFORE BREAKFAST   triamterene-hydrochlorothiazide (MAXZIDE-25) 37.5-25 MG tablet Take 1 tablet by mouth daily.   vitamin C (ASCORBIC ACID) 500 MG tablet Take 500 mg by mouth daily.    warfarin (COUMADIN) 5 MG tablet Take 1 tablet (516m by mouth daily as directed   XIIDRA 5 % SOLN Apply 1 drop to eye as needed.    No facility-administered encounter medications on file as of 09/05/2021.    Allergies (verified) Paxil [paroxetine hcl], Eliquis [apixaban], Penicillins, Pravachol [pravastatin sodium], Zetia [ezetimibe], and Vytorin [ezetimibe-simvastatin]   History: Past Medical History:  Diagnosis Date   Anal fissure    Arthritis    [place]  BPH (benign prostatic hypertrophy)    CAD (coronary artery disease)    Dr. Wynonia Lawman   Cataract    COVID-19    Depression 2004   Diverticulosis    GERD (gastroesophageal reflux disease)    History of TIAs    Hyperlipidemia    Hypertension    Hypertensive heart disease without CHF    Hypothyroidism    Internal hemorrhoids    Lumbar disc disease    Oral bleeding 08/20/2012   Following molar tooth extraction July, 2013   Pneumonia     Ruptured disk 1995   Shingles    Thyroid disease    Vitamin D deficiency    Vitreous detachment    Past Surgical History:  Procedure Laterality Date   bilateral cataract surgery  6/07   CARDIAC CATHETERIZATION     CORONARY ARTERY BYPASS GRAFT  12/99    Dr. Servando Snare    cysloscopy  8/08   EYE SURGERY     HERNIA REPAIR  1997   right   herninated disc  1995   PACEMAKER INSERTION  11/03/2020   PROSTATE BIOPSY  8/08   TONSILLECTOMY AND ADENOIDECTOMY     Family History  Problem Relation Age of Onset   Dementia Mother    Arthritis Mother    Cancer Brother        prostate   Asthma Sister    Heart disease Paternal Aunt    Heart disease Paternal Uncle    Hypertension Maternal Grandmother    CVA Maternal Grandmother    CVA Paternal Grandmother    Hepatitis C Son    Arthritis Son    Colon cancer Neg Hx    Colon polyps Neg Hx    Kidney disease Neg Hx    Esophageal cancer Neg Hx    Gallbladder disease Neg Hx    Diabetes Neg Hx    Social History   Socioeconomic History   Marital status: Widowed    Spouse name: [name]    Number of children: 1   Years of education: 12   Highest education level: Not on file  Occupational History   Occupation: Retired    Hydrologist: Social research officer, government x 27 years   Occupation: Retired    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  Tobacco Use   Smoking status: Former    Packs/day: 1.00    Years: 18.00    Pack years: 18.00    Types: Cigarettes    Start date: 12/17/1948    Quit date: 12/17/1966    Years since quitting: 67.7   Smokeless tobacco: Never  Vaping Use   Vaping Use: Never used  Substance and Sexual Activity   Alcohol use: Yes    Alcohol/week: 1.0 standard drink    Types: 1 Standard drinks or equivalent per week    Comment: rare   Drug use: No   Sexual activity: Not on file  Other Topics Concern   Not on file  Social History Narrative   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, music at [employer]       Right-handed      Caffeine use: none      Son and grandson live with him   Social Determinants of Health   Financial Resource Strain: Low Risk    Difficulty of Paying Living Expenses: Not hard at all  Food Insecurity: No Food Insecurity   Worried About Charity fundraiser in the Last Year: Never true  Ran Out of Food in the Last Year: Never true  Transportation Needs: No Transportation Needs   Lack of Transportation (Medical): No   Lack of Transportation (Non-Medical): No  Physical Activity: Sufficiently Active   Days of Exercise per Week: 5 days   Minutes of Exercise per Session: 50 min  Stress: No Stress Concern Present   Feeling of Stress : Only a little  Social Connections: Moderately Isolated   Frequency of Communication with Friends and Family: More than three times a week   Frequency of Social Gatherings with Friends and Family: More than three times a week   Attends Religious Services: Never   Marine scientist or Organizations: Yes   Attends Archivist Meetings: 1 to 4 times per year   Marital Status: Widowed    Tobacco Counseling Counseling given: Not Answered   Clinical Intake:  Pre-visit preparation completed: Yes  Pain : No/denies pain     BMI - recorded: 21.84 Nutritional Status: BMI of 19-24  Normal Nutritional Risks: None Diabetes: No  How often do you need to have someone help you when you read instructions, pamphlets, or other written materials from your doctor or pharmacy?: 1 - Never  Diabetic? no  Interpreter Needed?: No  Information entered by :: Ellon Marasco, LPN   Activities of Daily Living In your present state of health, do you have any difficulty performing the following activities: 09/05/2021  Hearing? N  Vision? N  Difficulty concentrating or making decisions? N  Walking or climbing stairs? N  Dressing or bathing? N  Doing errands, shopping? N  Preparing Food and eating ? N  Using the Toilet? N  In the past  six months, have you accidently leaked urine? N  Do you have problems with loss of bowel control? N  Managing your Medications? N  Managing your Finances? N  Housekeeping or managing your Housekeeping? N  Some recent data might be hidden    Patient Care Team: [name], DO as PCP - General (Family Medicine) Raynelle Bring, MD as Consulting Physician (Urology) Pyrtle, Lajuan Lines, MD as Consulting Physician (Gastroenterology) Clent Jacks, MD as Consulting Physician (Ophthalmology) Loni Beckwith Neita Goodnight., MD as Referring Physician (Cardiology)  Indicate any recent Medical Services you may have received from other than [facility] providers in the past year (date may be approximate).     Assessment:   This is a routine wellness examination for Dalton Vaughan.  Hearing/Vision screen Hearing Screening - Comments:: Denies hearing difficulties  Vision Screening - Comments:: Wears glasses prn - up to date with annual eye exam with Dr Katy Fitch  Dietary issues and exercise activities discussed: Current Exercise Habits: Home exercise routine, Type of exercise: walking;Other - see comments;strength training/weights (yard work), Time (Minutes): 50, Frequency (Times/Week): 5, Weekly Exercise (Minutes/Week): 250, Intensity: Moderate, Exercise limited by: orthopedic condition(s);cardiac condition(s)   Goals Addressed             This Visit's Progress    Patient Stated       Start sleeping all night - try not to drink water a couple hours before bed, try melatonin one hour before bed, avoid electronics or any stimulation before bed      Depression Screen PHQ 2/9 Scores 09/05/2021 08/25/2021 08/17/2021 08/11/2021 08/08/2021 06/30/2021 05/08/2021  PHQ - 2 Score 0 0 0 0 0 0 0  PHQ- 9 Score - - - - - - 2    Fall Risk Fall Risk  09/05/2021 08/25/2021 08/17/2021 08/11/2021 08/08/2021  Falls in the past year? 1 1 1 1 1   Number falls in past yr: 0 0 0 0 0  Injury with Fall? 1 0 0 0 0  Risk for fall due to : History  of fall(s);Orthopedic patient History of fall(s) History of fall(s) History of fall(s) History of fall(s)  Follow up Education provided;Falls prevention discussed Education provided - Education provided Education provided    FALL RISK PREVENTION PERTAINING TO THE HOME:  Any stairs in or around the home? Yes  If so, are there any without handrails? No  Home free of loose throw rugs in walkways, pet beds, electrical cords, etc? Yes  Adequate lighting in your home to reduce risk of falls? Yes   ASSISTIVE DEVICES UTILIZED TO PREVENT FALLS:  Life alert? No  Use of a cane, walker or w/c? No  Grab bars in the bathroom? Yes  Shower chair or bench in shower? Yes  Elevated toilet seat or a handicapped toilet? No   TIMED UP AND GO:  Was the test performed? No . Telephonic visit  Cognitive Function: Normal cognitive status assessed by direct observation by this Nurse Health Advisor. No abnormalities found.   MMSE - Mini Mental State Exam 01/26/2019 01/23/2018 09/01/2015  Orientation to time 5 5 5   Orientation to Place 5 5 5   Registration 3 3 3   Attention/ Calculation 5 5 5   Recall 3 3 3   Language- name 2 objects 2 2 2   Language- repeat 1 1 1   Language- follow 3 step command 3 3 3   Language- read & follow direction 1 1 1   Write a sentence 1 1 1   Copy design 1 1 1   Total score 30 30 30         Immunizations Immunization History  Administered Date(s) Administered   Fluad Quad(high Dose 65+) 10/11/2020   Influenza, High Dose Seasonal PF 11/05/2016, 10/08/2017, 09/24/2018, 10/19/2019   Influenza,inj,Quad PF,6+ Mos 09/15/2013, 09/22/2014, 10/04/2015   Moderna Sars-Covid-2 Vaccination 02/16/2020, 03/15/2020   Pneumococcal Conjugate-13 01/04/2014   Pneumococcal Polysaccharide-23 12/17/1996, 11/15/2015   Zoster Recombinat (Shingrix) 01/26/2019, 05/06/2019    TDAP status: Due, Education has been provided regarding the importance of this vaccine. Advised may receive this vaccine at local  pharmacy or Health Dept. Aware to provide a copy of the vaccination record if obtained from local pharmacy or Health Dept. Verbalized acceptance and understanding.  Flu Vaccine status: Due, Education has been provided regarding the importance of this vaccine. Advised may receive this vaccine at local pharmacy or Health Dept. Aware to provide a copy of the vaccination record if obtained from local pharmacy or Health Dept. Verbalized acceptance and understanding.  Pneumococcal vaccine status: Up to date  Covid-19 vaccine status: Information provided on how to obtain vaccines.   Qualifies for Shingles Vaccine? Yes   Zostavax completed Yes   Shingrix Completed?: Yes  Screening Tests Health Maintenance  Topic Date Due   COVID-19 Vaccine (3 - Moderna risk series) 09/20/2021 (Originally 04/12/2020)   INFLUENZA VACCINE  03/16/2022 (Originally 07/17/2021)   TETANUS/TDAP  04/24/2022 (Originally 12/18/2015)   Zoster Vaccines- Shingrix  Completed   HPV VACCINES  Aged Out    Health Maintenance  There are no preventive care reminders to display for this patient.  Colorectal cancer screening: No longer required.   Lung Cancer Screening: (Low Dose CT Chest recommended if Age 14-80 years, 30 pack-year currently smoking OR have quit w/in 15years.) does not qualify.   Additional Screening:  Hepatitis C Screening: does not qualify  Vision Screening: Recommended annual ophthalmology exams for early detection of glaucoma and other disorders of the eye. Is the patient up to date with their annual eye exam?  Yes  Who is the provider or what is the name of the office in which the patient attends annual eye exams? [facility] If pt is not established with a provider, would they like to be referred to a provider to establish care? No .   Dental Screening: Recommended annual dental exams for proper oral hygiene  Community Resource Referral / Chronic Care Management: CRR required this visit?  No   CCM required  this visit?  No      Plan:     I have personally reviewed and noted the following in the patient's chart:   Medical and social history Use of alcohol, tobacco or illicit drugs  Current medications and supplements including opioid prescriptions. Patient is not currently taking opioid prescriptions. Functional ability and status Nutritional status Physical activity Advanced directives List of other physicians Hospitalizations, surgeries, and ER visits in previous 12 months Vitals Screenings to include cognitive, depression, and falls Referrals and appointments  In addition, I have reviewed and discussed with patient certain preventive protocols, quality metrics, and best practice recommendations. A written personalized care plan for preventive services as well as general preventive health recommendations were provided to patient.     Sandrea Hammond, LPN   [id number]/81/[id number]   Nurse Notes: None

## 2021-09-12 DIAGNOSIS — L570 Actinic keratosis: Secondary | ICD-10-CM | POA: Diagnosis not present

## 2021-09-18 ENCOUNTER — Other Ambulatory Visit: Payer: Self-pay | Admitting: Family Medicine

## 2021-10-18 ENCOUNTER — Encounter: Payer: Self-pay | Admitting: Family Medicine

## 2021-10-18 ENCOUNTER — Other Ambulatory Visit: Payer: Self-pay

## 2021-10-18 ENCOUNTER — Ambulatory Visit (INDEPENDENT_AMBULATORY_CARE_PROVIDER_SITE_OTHER): Payer: Medicare Other | Admitting: Family Medicine

## 2021-10-18 VITALS — BP 139/77 | HR 85 | Temp 97.8°F | Ht 72.0 in | Wt 166.0 lb

## 2021-10-18 DIAGNOSIS — I482 Chronic atrial fibrillation, unspecified: Secondary | ICD-10-CM

## 2021-10-18 DIAGNOSIS — E079 Disorder of thyroid, unspecified: Secondary | ICD-10-CM

## 2021-10-18 DIAGNOSIS — Z23 Encounter for immunization: Secondary | ICD-10-CM

## 2021-10-18 DIAGNOSIS — S76311D Strain of muscle, fascia and tendon of the posterior muscle group at thigh level, right thigh, subsequent encounter: Secondary | ICD-10-CM

## 2021-10-18 LAB — COAGUCHEK XS/INR WAIVED
INR: 3.1 — ABNORMAL HIGH (ref 0.9–1.1)
Prothrombin Time: 37.2 s

## 2021-10-18 NOTE — Patient Instructions (Signed)
Hamstring Strain A hamstring strain happens when the muscles in the back of the thighs (hamstring muscles) are overstretched or torn. The hamstring muscles are used in straightening the hips, bending the knees, and pulling back the legs. This injury is often called a pulled hamstring muscle. The tissue that connects the muscle to a bone (tendon) may also be affected. The severity of a hamstring strain may be rated in degrees or grades. First-degree (or grade 1) strains have the least amount of muscle tearing and pain. Second-degree and third-degree (grade 2 and 3) strains have increasingly more tearing and pain. What are the causes? This condition is caused by a sudden, violent force being placed on the hamstring muscles, stretching them too far. This often happens during activities that involve running, jumping, kicking, or weight lifting. What increases the risk? Hamstring strains are especially common in athletes. The following factors may also make you more likely to develop this condition: Having low strength, endurance, or flexibility of the hamstring muscles. Doing high-impact physical activity or sports. Having poor physical fitness. Having a previous leg injury. Having tired (fatigued) muscles. What are the signs or symptoms? Symptoms of this condition include: Pain in the back of the thigh. Swelling. Bruising. Muscle spasms. Trouble moving the affected muscle because of pain. For severe strains, you may feel popping or snapping in the back of your thigh when the injury occurs. How is this diagnosed? This condition is diagnosed based on your symptoms, your medical history, and a physical exam. How is this treated? Treatment for this condition usually involves: Protecting, resting, icing, applying compression, and elevating the injured area (PRICE therapy). Medicines. Your health care provider may recommend medicines to help reduce pain or inflammation. Doing exercises to regain  strength and flexibility in the muscles. Your health care provider will tell you when it is okay to begin exercising. Follow these instructions at home: PRICE therapy Use PRICE therapy to promote muscle healing during the first 2-3 days after your injury, or as told by your health care provider. Protect the muscle from being injured again. Rest your injury. This usually involves limiting your normal activities and not using the injured hamstring muscle. Talk with your health care provider about how you should limit your activities. Apply ice to the injured area: Put ice in a plastic bag. Place a towel between your skin and the bag. Leave the ice on for 20 minutes, 2-3 times a day. After the third day, switch to applying heat as told. Put pressure (compression) on your injured hamstring by wrapping it with an elastic bandage. Be careful not to wrap it too tightly. That may interfere with blood circulation or may increase swelling. Raise (elevate) your injured hamstring above the level of your heart as often as possible. When you are lying down, you can do this by putting a pillow under your thigh.  Activity Begin exercising or stretching only as told by your health care provider. Do not return to full activity level until your health care provider approves. To help prevent muscle strains in the future, always warm up before exercising and stretch afterward. General instructions Take over-the-counter and prescription medicines only as told by your health care provider. If directed, apply heat to the affected area as often as told by your health care provider. Use the heat source that your health care provider recommends, such as a moist heat pack or a heating pad. Place a towel between your skin and the heat source. Leave the heat  on for 20-30 minutes. Remove the heat if your skin turns bright red. This is especially important if you are unable to feel pain, heat, or cold. You may have a greater  risk of getting burned. Keep all follow-up visits as told by your health care provider. This is important. Contact a health care provider if you have: Increasing pain or swelling in the injured area. Numbness, tingling, or a significant loss of strength in the injured area. Get help right away if: Your foot or your toes become cold or turn blue. Summary A hamstring strain happens when the muscles in the back of the thighs (hamstring muscles) are overstretched or torn. This injury can be caused by a sudden, violent force being placed on the hamstring muscles, causing them to stretch too far. Symptoms include pain, swelling, and muscle spasms in the injured area. Treatment includes what is called PRICE therapy: protecting, resting, icing, applying compression, and elevating the injured area. This information is not intended to replace advice given to you by your health care provider. Make sure you discuss any questions you have with your health care provider. Document Revised: 08/05/2020 Document Reviewed: 08/05/2020 Elsevier Patient Education  [employer].

## 2021-10-18 NOTE — Progress Notes (Signed)
Subjective: CC:INR check PCP: [name], DO HPI:Dalton Vaughan is a 87 y.o. male presenting to clinic today for:  1. INR check Compliant with Coumadin.  No bleeding episodes.  No heart palpitations or racing  2. Hypothyroidism Compliant with Synthroid.  No change in voice, no tremor.  He does report some harder stools than normal and feels that he is hydrating adequately with water.  He has not been using Colace anymore however  3.  Right leg pain Patient reports waxing and waning pain that he gets in the right posterior lower leg.  He feels like he is got a hamstring issue.  He seen physical therapy for this in the past and would be willing to do this again if this would help   ROS: Per HPI  Allergies  Allergen Reactions   Paxil [Paroxetine Hcl] Palpitations   Eliquis [Apixaban] Other (See Comments)    dizziness   Penicillins Other (See Comments)    Did it involve swelling of the face/tongue/throat, SOB, or low BP? Unknown Did it involve sudden or severe rash/hives, skin peeling, or any reaction on the inside of your mouth or nose? Unknown Did you need to seek medical attention at a hospital or doctor's office? Unknown When did it last happen?      unk If all above answers are "NO", may proceed with cephalosporin use.    Pravachol [Pravastatin Sodium] Other (See Comments)    myalgias   Zetia [Ezetimibe] Other (See Comments)    myalgias   Vytorin [Ezetimibe-Simvastatin] Other (See Comments)    myalgias   Past Medical History:  Diagnosis Date   Anal fissure    Arthritis    Atrial fibrillation (HCC)    BPH (benign prostatic hypertrophy)    CAD (coronary artery disease)    Dr. Wynonia Lawman   Cataract    COVID-19    Depression 2004   Diverticulosis    GERD (gastroesophageal reflux disease)    History of TIAs    Hyperlipidemia    Hypertension    Hypertensive heart disease without CHF    Hypothyroidism    Internal hemorrhoids    Lumbar disc disease    Oral  bleeding 08/20/2012   Following molar tooth extraction July, 2013   Pneumonia    Ruptured disk 1995   Shingles    Thyroid disease    Vitamin D deficiency    Vitreous detachment     Current Outpatient Medications:    amLODipine (NORVASC) 5 MG tablet, Take 1 tablet (5 mg total) by mouth daily., Disp: 90 tablet, Rfl: 1   Calcium Carb-Cholecalciferol (CALCIUM + D3) 600-200 MG-UNIT TABS, Take 1 tablet by mouth daily. , Disp: , Rfl:    Cholecalciferol (VITAMIN D-3) 5000 UNITS TABS, Take 1 tablet by mouth daily., Disp: , Rfl:    doxazosin (CARDURA) 8 MG tablet, TAKE 1 TABLET AT BEDTIME, Disp: 90 tablet, Rfl: 3   mirtazapine (REMERON) 15 MG tablet, TAKE 1 TABLET AT BEDTIME, Disp: 90 tablet, Rfl: 3   Multiple Vitamin (MULTIVITAMIN) tablet, Take 1 tablet by mouth daily., Disp: , Rfl:    ramipril (ALTACE) 10 MG capsule, Take 1 capsule (10 mg total) by mouth daily., Disp: 90 capsule, Rfl: 3   SIMETHICONE PO, Take by mouth as needed., Disp: , Rfl:    SYNTHROID 50 MCG tablet, TAKE 1 TABLET DAILY BEFORE BREAKFAST, Disp: 90 tablet, Rfl: 3   triamterene-hydrochlorothiazide (MAXZIDE-25) 37.5-25 MG tablet, TAKE 1 TABLET DAILY, Disp: 90 tablet, Rfl: 0  vitamin C (ASCORBIC ACID) 500 MG tablet, Take 500 mg by mouth daily. , Disp: , Rfl:    warfarin (COUMADIN) 5 MG tablet, Take 1 tablet (12m) by mouth daily as directed, Disp: 90 tablet, Rfl: 1   XIIDRA 5 % SOLN, Apply 1 drop to eye as needed. 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   Occupation: Retired    EFish farm manager PMarine scientist   Comment: supervisor x 17 years  Tobacco Use   Smoking status: Former    Packs/day: 1.00    Years: 18.00    Pack years: 18.00    Types: Cigarettes    Start date: 12/17/1948    Quit date:  12/17/1966    Years since quitting: 54.8   Smokeless tobacco: Never  Vaping Use   Vaping Use: Never used  Substance and Sexual Activity   Alcohol use: Yes    Alcohol/week: 1.0 standard drink    Types: 1 Standard drinks or equivalent per week    Comment: rare   Drug use: No   Sexual activity: Not on file  Other Topics Concern   Not on file  Social History Narrative   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, musicat [employer]     Right-handed      Caffeine use: none      Son and grandson live with him   Social Determinants of HRadio broadcast assistantStrain: Low Risk    Difficulty of Paying Living Expenses: Not hard at all  Food Insecurity: No Food Insecurity   Worried About RCharity fundraiserin the Last Year: Never true   RArboriculturistin the Last Year: Never true  Transportation Needs: No Transportation Needs   Lack of Transportation (Medical): No   Lack of Transportation (Non-Medical): No  Physical Activity: Sufficiently Active   Days of Exercise per Week: 5 days   Minutes of Exercise per Session: 50 min  Stress: No Stress Concern Present   Feeling of Stress : Only a little  Social Connections: Moderately Isolated   Frequency of Communication with Friends and Family: More than three times a week   Frequency of Social Gatherings with Friends and Family: More than three times a week   Attends Religious Services: Never   AMarine scientistor Organizations: Yes   Attends CArchivistMeetings: 1 to 4 times per year   Marital Status: Widowed  IHuman resources officerViolence: Not At Risk   Fear of Current or Ex-Partner: No   Emotionally Abused: No   Physically Abused: No   Sexually Abused: No   Family History  Problem Relation Age of Onset   Dementia Mother    Arthritis Mother    Cancer Brother        prostate   Asthma Sister    Heart disease Paternal Aunt    Heart disease Paternal Uncle    Hypertension Maternal Grandmother    CVA Maternal  Grandmother    CVA Paternal Grandmother    Hepatitis C Son    Arthritis Son    Colon cancer Neg Hx    Colon polyps Neg Hx    Kidney  disease Neg Hx    Esophageal cancer Neg Hx    Gallbladder disease Neg Hx    Diabetes Neg Hx     Objective: Office vital signs reviewed. BP 139/77   Pulse 85   Temp 97.8 F (36.6 C)   Ht 6' (1.829 m)   Wt 166 lb (75.3 kg)   SpO2 98%   BMI 22.51 kg/m   Physical Examination:  General: Awake, alert, well-appearing elderly male, No acute distress HEENT: Normal; sclera white.  No exophthalmos or goiter Cardio: regular rate and rhythm, S1S2 heard, no murmurs appreciated Pulm: clear to auscultation bilaterally, no wheezes, rhonchi or rales; normal work of breathing on room air MSK: Ambulating independently with normal gait Neuro: No tremor  Assessment/ Plan: 87 y.o. male   Chronic atrial fibrillation ([place]) - Plan: CoaguChek XS/INR Waived  Thyroid disease - Plan: T4, free, TSH  Need for immunization against influenza - Plan: Flu Vaccine QUAD High Dose(Fluad)  Strain of right hamstring, subsequent encounter - Plan: Ambulatory referral to Physical Therapy  Rate and rhythm controlled.  INR is slightly supratherapeutic at 3.1 but unlikely make any changes given stability in the past.  Okay to incorporate some greens this week.  He has been scheduled for 6-week follow-up  Check thyroid levels  Influenza vaccination administered  For the strain of what sounds like his right hamstring we are going to place referral to physical therapy.  If no significant improvement, low threshold to have him see an orthopedist or sports medicine doctor for further assessment under ultrasound  Orders Placed This Encounter  Procedures   CoaguChek XS/INR Waived   T4, free   TSH   No orders of the defined types were placed in this encounter.    [name], DO [place] [phone]

## 2021-10-19 LAB — TSH: TSH: 4.55 u[IU]/mL — ABNORMAL HIGH (ref 0.450–4.500)

## 2021-10-19 LAB — T4, FREE: Free T4: 1.37 ng/dL (ref 0.82–1.77)

## 2021-10-23 DIAGNOSIS — Z951 Presence of aortocoronary bypass graft: Secondary | ICD-10-CM | POA: Diagnosis not present

## 2021-10-23 DIAGNOSIS — I495 Sick sinus syndrome: Secondary | ICD-10-CM | POA: Diagnosis not present

## 2021-10-23 DIAGNOSIS — R001 Bradycardia, unspecified: Secondary | ICD-10-CM | POA: Diagnosis not present

## 2021-10-23 DIAGNOSIS — Z950 Presence of cardiac pacemaker: Secondary | ICD-10-CM | POA: Diagnosis not present

## 2021-10-23 DIAGNOSIS — I4811 Longstanding persistent atrial fibrillation: Secondary | ICD-10-CM | POA: Diagnosis not present

## 2021-10-23 DIAGNOSIS — I2510 Atherosclerotic heart disease of native coronary artery without angina pectoris: Secondary | ICD-10-CM | POA: Diagnosis not present

## 2021-10-24 ENCOUNTER — Ambulatory Visit: Payer: Medicare Other | Attending: Family Medicine | Admitting: Physical Therapy

## 2021-10-24 ENCOUNTER — Encounter: Payer: Self-pay | Admitting: Physical Therapy

## 2021-10-24 ENCOUNTER — Other Ambulatory Visit: Payer: Self-pay

## 2021-10-24 DIAGNOSIS — M62838 Other muscle spasm: Secondary | ICD-10-CM | POA: Diagnosis not present

## 2021-10-24 DIAGNOSIS — S76311D Strain of muscle, fascia and tendon of the posterior muscle group at thigh level, right thigh, subsequent encounter: Secondary | ICD-10-CM | POA: Insufficient documentation

## 2021-10-24 DIAGNOSIS — M79651 Pain in right thigh: Secondary | ICD-10-CM

## 2021-10-24 NOTE — Therapy (Signed)
[facility] [place], [place], [address] Phone: [phone]   Fax:  [phone]  Physical Therapy Evaluation  Patient Details  Name: Dalton Vaughan MRN: [id number] Date of Birth: 1929/04/18 Referring Provider (PT): Ronnie Doss DO.   Encounter Date: 10/24/2021   PT End of Session - 10/24/21 0926     Visit Number 1    Number of Visits 12    Date for PT Re-Evaluation 01/22/22    PT Start Time 7416    PT Stop Time 0936    PT Time Calculation (min) 38 min    Activity Tolerance Patient tolerated treatment well    Behavior During Therapy [facility] for tasks assessed/performed             Past Medical History:  Diagnosis Date   Anal fissure    Arthritis    Atrial fibrillation ([place])    BPH (benign prostatic hypertrophy)    CAD (coronary artery disease)    Dr. Wynonia Lawman   Cataract    COVID-19    Depression 2004   Diverticulosis    GERD (gastroesophageal reflux disease)    History of TIAs    Hyperlipidemia    Hypertension    Hypertensive heart disease without CHF    Hypothyroidism    Internal hemorrhoids    Lumbar disc disease    Oral bleeding 08/20/2012   Following molar tooth extraction July, 2013   Pneumonia    Ruptured disk 1995   Shingles    Thyroid disease    Vitamin D deficiency    Vitreous detachment     Past Surgical History:  Procedure Laterality Date   bilateral cataract surgery  6/07   CARDIAC CATHETERIZATION     CORONARY ARTERY BYPASS GRAFT  12/99    Dr. Servando Snare    cysloscopy  8/08   [place]   right   herninated disc  [place]  11/03/2020   PROSTATE BIOPSY  8/08   TONSILLECTOMY AND ADENOIDECTOMY      There were no vitals filed for this visit.    Subjective Assessment - 10/24/21 0930     Subjective COVID-19 screen performed prior to patient entering clinic.  The patient presents to the clinic with c/o a chronic right hamstring problem.  He  had treatment earlier this year and responded well.  At rest, his pain-level is a 4/10 but can rise to 7+/10 when sitting for a long period of time and then getting up to walk.  He has had chronic lumbar issue and a PMH remarkable for a right knee arthroscopic knee surgery.  He would like to walk and do more without the right thigh pain.    Pertinent History PACEMAKER, OA, CAD, HTN , hypothyroidism, lumbar disc disease.    How long can you sit comfortably? Sitting for long periods of time andthen getting up is very painful.    Patient Stated Goals See above.    Currently in Pain? Yes    Pain Score 4     Pain Location --   Right hamstrings.   Pain Orientation Right;Posterior    Pain Descriptors / Indicators Sore    Pain Type Chronic pain    Pain Onset More than a month ago    Pain Frequency Constant    Aggravating Factors  See above.    Pain Relieving Factors Not moving.  [facility] PT Assessment - 10/24/21 0001       Assessment   Medical Diagnosis Strain of right hamstring.    Referring Provider (PT) Ronnie Doss DO.    Onset Date/Surgical Date --   Ongoing.     Precautions   Precautions ICD/Pacemaker      Restrictions   Weight Bearing Restrictions No      Balance Screen   Has the patient fallen in the past 6 months No    Has the patient had a decrease in activity level because of a fear of falling?  Yes    Is the patient reluctant to leave their home because of a fear of falling?  No      Home Ecologist residence      Prior Function   Level of Independence Independent      ROM / Strength   AROM / PROM / Strength AROM;Strength      AROM   Overall AROM Comments Right knee lacks 10 degrees of extension and he actively flexes to 105 degrees.      Strength   Overall Strength Comments Right knee strength is essentially normal with some pain reproduction in the right hamstrings with resisted flexion.      Palpation    Palpation comment Rather diffuse right hamstring pain (not much at ischial tuberosity) and crossing knee joint into proximal calf region as well.      Special Tests   Other special tests 90/90 test was 15 degrees less on right than left.  (+) right SLR test.      Ambulation/Gait   Gait Comments The patient walks with right knee held in some flexion.  He states he limps significantly for awhile after getting up to walk after sitting for prolnged periods of time.                        Objective measurements completed on examination: See above findings.       [facility] Adult PT Treatment/Exercise - 10/24/21 0001       Modalities   Modalities Vasopneumatic      Vasopneumatic   Number Minutes Vasopneumatic  15 minutes    Vasopnuematic Location  --   Right thigh.   Vasopneumatic Pressure Medium                          PT Long Term Goals - 10/24/21 1120       PT LONG TERM GOAL #1   Title Independent with a HEP.    Time 6    Period Weeks    Status New      PT LONG TERM GOAL #2   Title Perform ADL's with right hamstring pain not > 2-3/10.    Time 6    Period Weeks      PT LONG TERM GOAL #3   Title Sit for 30 minutes and get up and walk with right hamstring pain not > 3/10.    Time 6    Period Weeks    Status New                    Plan - 10/24/21 1024     Clinical Impression Statement The patient presents to OPPT with continued c/o right hamstring pain.  He had a good response to treatments earlier this year but it began to flare-up on him again recently.  He reports high pain-levels after sitting for prolonged periods of time and then getting up to walk. Has has diffuse right hamstring pain that goes below the knee in the upper calf region.  His 90/90 test is positive per contralateral comparison. Patient will benefit from skilled physical therapy intervention to address pain and deficits.    Personal Factors and Comorbidities Other     Examination-Activity Limitations Locomotion Level;Other    Examination-Participation Restrictions Other    Stability/Clinical Decision Making Stable/Uncomplicated    Clinical Decision Making Low    Rehab Potential Good    PT Frequency 2x / week    PT Duration 6 weeks    PT Treatment/Interventions ADLs/Self Care Home Management;Cryotherapy;Moist Heat;Therapeutic activities;Therapeutic exercise;Manual techniques;Patient/family education;Passive range of motion;Dry needling    PT Next Visit Plan STW/M to patient's right hamstrings, vasopneumatic, gentle pain-free stretching.    Consulted and Agree with Plan of Care Patient             Patient will benefit from skilled therapeutic intervention in order to improve the following deficits and impairments:  Abnormal gait, Increased muscle spasms, Decreased activity tolerance, Pain, Decreased range of motion  Visit Diagnosis: Pain in right thigh - Plan: PT plan of care cert/re-cert  Other muscle spasm - Plan: PT plan of care cert/re-cert     Problem List Patient Active Problem List   Diagnosis Date Noted   Diverticulitis 08/08/2021   Pacemaker 11/03/2020   Radiculopathy, lumbar region 01/25/2020   Hypertension 01/25/2020   Acute respiratory failure with hypoxia ([place]) 09/16/2019   Osteoarthritis of spine with radiculopathy, lumbar region 01/08/2018   Thyroid disease    Aortic atherosclerosis ([place]) 05/02/2017   Thrombocytopenia ([place]) 07/10/2016   Cervical spondylosis without myelopathy 04/25/2016   Hereditary and idiopathic peripheral neuropathy 10/16/2015   Polyneuropathy 10/04/2015   Allergic rhinitis 05/06/2014   Depression 08/03/2013   Sick sinus syndrome ([place]) 07/31/2012   Hypertensive heart disease without CHF    Chronic atrial fibrillation ([place])    Coronary artery disease involving native coronary artery of native heart with angina pectoris ([place])    History of TIAs    GERD (gastroesophageal reflux disease)    Chronic  anticoagulation    Benign prostatic hyperplasia    Hyperlipidemia     Storm Dulski, [place], PT 10/24/2021, 11:26 AM  [facility] [address] [place], [place], [address] Phone: [phone]   Fax:  [phone]  Name: Dalton Vaughan MRN: [id number] Date of Birth: 1929/07/21

## 2021-10-26 ENCOUNTER — Ambulatory Visit: Payer: Medicare Other | Admitting: Physical Therapy

## 2021-10-27 ENCOUNTER — Encounter: Payer: Self-pay | Admitting: Family Medicine

## 2021-10-27 ENCOUNTER — Other Ambulatory Visit: Payer: Self-pay

## 2021-10-27 ENCOUNTER — Ambulatory Visit (INDEPENDENT_AMBULATORY_CARE_PROVIDER_SITE_OTHER): Payer: Medicare Other | Admitting: Family Medicine

## 2021-10-27 VITALS — BP 151/93 | HR 87 | Temp 97.8°F | Ht 72.0 in | Wt 166.0 lb

## 2021-10-27 DIAGNOSIS — R102 Pelvic and perineal pain: Secondary | ICD-10-CM

## 2021-10-27 DIAGNOSIS — K5792 Diverticulitis of intestine, part unspecified, without perforation or abscess without bleeding: Secondary | ICD-10-CM

## 2021-10-27 LAB — MICROSCOPIC EXAMINATION
Epithelial Cells (non renal): NONE SEEN /hpf (ref 0–10)
Renal Epithel, UA: NONE SEEN /hpf

## 2021-10-27 LAB — URINALYSIS, ROUTINE W REFLEX MICROSCOPIC
Bilirubin, UA: NEGATIVE
Glucose, UA: NEGATIVE
Ketones, UA: NEGATIVE
Leukocytes,UA: NEGATIVE
Nitrite, UA: NEGATIVE
Protein,UA: NEGATIVE
Specific Gravity, UA: 1.015 (ref 1.005–1.030)
Urobilinogen, Ur: 0.2 mg/dL (ref 0.2–1.0)
pH, UA: 8.5 — ABNORMAL HIGH (ref 5.0–7.5)

## 2021-10-27 MED ORDER — METRONIDAZOLE 500 MG PO TABS: 500.0000 mg | ORAL_TABLET | Freq: Two times a day (BID) | ORAL | 0 refills | Status: AC

## 2021-10-27 MED ORDER — CIPROFLOXACIN HCL 500 MG PO TABS: 500.0000 mg | ORAL_TABLET | Freq: Two times a day (BID) | ORAL | 0 refills | Status: AC

## 2021-10-27 NOTE — Progress Notes (Signed)
Subjective: CM:KLKJZP pain PCP: [name], DO HPI:Dalton Vaughan is a 87 y.o. male presenting to clinic today for:  1. Pelvic pain Patient reports about a 1 week history of lower abdominal/ back pain.  This was preceded by some oral swelling after a flu shot.  He did have a low-grade fever right after his flu shot but has not had any issues since.  He had an episode of nausea without vomiting a couple of days ago.  He denies any diarrhea, hematochezia, dysuria, hematuria.  He has been having harder stools but overall normal stooling with last bowel movement within the last 24 hours.  No straining reported.  Symptoms feel similar to when he had diverticulitis a couple of months ago.   ROS: Per HPI  Allergies  Allergen Reactions   Paxil [Paroxetine Hcl] Palpitations   Eliquis [Apixaban] Other (See Comments)    dizziness   Penicillins Other (See Comments)    Did it involve swelling of the face/tongue/throat, SOB, or low BP? Unknown Did it involve sudden or severe rash/hives, skin peeling, or any reaction on the inside of your mouth or nose? Unknown Did you need to seek medical attention at a hospital or doctor's office? Unknown When did it last happen?      unk If all above answers are "NO", may proceed with cephalosporin use.    Pravachol [Pravastatin Sodium] Other (See Comments)    myalgias   Zetia [Ezetimibe] Other (See Comments)    myalgias   Vytorin [Ezetimibe-Simvastatin] Other (See Comments)    myalgias   Past Medical History:  Diagnosis Date   Anal fissure    Arthritis    Atrial fibrillation (HCC)    BPH (benign prostatic hypertrophy)    CAD (coronary artery disease)    Dr. Wynonia Lawman   Cataract    COVID-19    Depression 2004   Diverticulosis    GERD (gastroesophageal reflux disease)    History of TIAs    Hyperlipidemia    Hypertension    Hypertensive heart disease without CHF    Hypothyroidism    Internal hemorrhoids    Lumbar disc disease    Oral  bleeding 08/20/2012   Following molar tooth extraction July, 2013   Pneumonia    Ruptured disk 1995   Shingles    Thyroid disease    Vitamin D deficiency    Vitreous detachment     Current Outpatient Medications:    Calcium Carb-Cholecalciferol (CALCIUM + D3) 600-200 MG-UNIT TABS, Take 1 tablet by mouth daily. , Disp: , Rfl:    Cholecalciferol (VITAMIN D-3) 5000 UNITS TABS, Take 1 tablet by mouth daily., Disp: , Rfl:    doxazosin (CARDURA) 8 MG tablet, TAKE 1 TABLET AT BEDTIME, Disp: 90 tablet, Rfl: 3   mirtazapine (REMERON) 15 MG tablet, TAKE 1 TABLET AT BEDTIME, Disp: 90 tablet, Rfl: 3   Multiple Vitamin (MULTIVITAMIN) tablet, Take 1 tablet by mouth daily., Disp: , Rfl:    ramipril (ALTACE) 10 MG capsule, Take 1 capsule (10 mg total) by mouth daily., Disp: 90 capsule, Rfl: 3   SIMETHICONE PO, Take by mouth as needed., Disp: , Rfl:    SYNTHROID 50 MCG tablet, TAKE 1 TABLET DAILY BEFORE BREAKFAST, Disp: 90 tablet, Rfl: 3   triamterene-hydrochlorothiazide (MAXZIDE-25) 37.5-25 MG tablet, TAKE 1 TABLET DAILY, Disp: 90 tablet, Rfl: 0   vitamin C (ASCORBIC ACID) 500 MG tablet, Take 500 mg by mouth daily. , Disp: , Rfl:    warfarin (COUMADIN) 5  MG tablet, Take 1 tablet (63m) by mouth daily as directed, Disp: 90 tablet, Rfl: 1   XIIDRA 5 % SOLN, Apply 1 drop to eye as needed. 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  Tobacco Use   Smoking status: Former    Packs/day: 1.00    Years: 18.00    Pack years: 18.00    Types: Cigarettes    Start date: 12/17/1948    Quit date:  12/17/1966    Years since quitting: 54.8   Smokeless tobacco: Never  Vaping Use   Vaping Use: Never used  Substance and Sexual Activity   Alcohol use: Yes    Alcohol/week: 1.0 standard drink    Types: 1 Standard drinks or equivalent per week    Comment: rare   Drug use: No   Sexual activity: Not on file  Other Topics Concern   Not on file  Social History Narrative   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, musicat [employer]     Right-handed      Caffeine use: none      Son and grandson live with him   Social Determinants of HRadio broadcast assistantStrain: Low Risk    Difficulty of Paying Living Expenses: Not hard at all  Food Insecurity: No Food Insecurity   Worried About RCharity fundraiserin the Last Year: Never true   RArboriculturistin the Last Year: Never true  Transportation Needs: No Transportation Needs   Lack of Transportation (Medical): No   Lack of Transportation (Non-Medical): No  Physical Activity: Sufficiently Active   Days of Exercise per Week: 5 days   Minutes of Exercise per Session: 50 min  Stress: No Stress Concern Present   Feeling of Stress : Only a little  Social Connections: Moderately Isolated   Frequency of Communication with Friends and Family: More than three times a week   Frequency of Social Gatherings with Friends and Family: More than three times a week   Attends Religious Services: Never   AMarine scientistor Organizations: Yes   Attends CArchivistMeetings: 1 to 4 times per year   Marital Status: Widowed  IHuman resources officerViolence: Not At Risk   Fear of Current or Ex-Partner: No   Emotionally Abused: No   Physically Abused: No   Sexually Abused: No   Family History  Problem Relation Age of Onset   Dementia Mother    Arthritis Mother    Cancer Brother        prostate   Asthma Sister    Heart disease Paternal Aunt    Heart disease Paternal Uncle    Hypertension Maternal Grandmother    CVA Maternal  Grandmother    CVA Paternal Grandmother    Hepatitis C Son    Arthritis Son    Colon cancer Neg Hx    Colon polyps Neg Hx    Kidney disease Neg  Hx    Esophageal cancer Neg Hx    Gallbladder disease Neg Hx    Diabetes Neg Hx     Objective: Office vital signs reviewed. BP (!) 151/93   Pulse 87   Temp 97.8 F (36.6 C)   Ht 6' (1.829 m)   Wt 166 lb (75.3 kg)   SpO2 98%   BMI 22.51 kg/m   Physical Examination:  General: Awake, alert, nontoxic male, No acute distress HEENT: Normal; sclera white GI: Flat, soft.  He has moderate tenderness palpation the left lower quadrant and mild tenderness palpation extending into the mid and right lower abdomen.  No guarding or rebound.  Bowel sounds are present.  Assessment/ Plan: 87 y.o. male   Diverticulitis - Plan: ciprofloxacin (CIPRO) 500 MG tablet, metroNIDAZOLE (FLAGYL) 500 MG tablet  Pelvic pain - Plan: Urinalysis, Routine w reflex microscopic  Clinically consistent with a diverticulitis.  Typically, would avoid antibiotics but since he has tried conservative measures for the last week and symptoms are not improving we will proceed with antibiotics.  We discussed red flag signs and symptoms warranting further evaluation in the ER.  We will have him back in the next 7 to 10 days to have INR rechecked since we are placing him on Cipro.  Anticipate that it will take a couple of weeks before we can get his levels back to normal.  No orders of the defined types were placed in this encounter.  No orders of the defined types were placed in this encounter.    [name], DO [place] [phone]

## 2021-11-02 DIAGNOSIS — M79676 Pain in unspecified toe(s): Secondary | ICD-10-CM | POA: Diagnosis not present

## 2021-11-02 DIAGNOSIS — I70203 Unspecified atherosclerosis of native arteries of extremities, bilateral legs: Secondary | ICD-10-CM | POA: Diagnosis not present

## 2021-11-02 DIAGNOSIS — L84 Corns and callosities: Secondary | ICD-10-CM | POA: Diagnosis not present

## 2021-11-02 DIAGNOSIS — B351 Tinea unguium: Secondary | ICD-10-CM | POA: Diagnosis not present

## 2021-11-07 ENCOUNTER — Ambulatory Visit (INDEPENDENT_AMBULATORY_CARE_PROVIDER_SITE_OTHER): Payer: Medicare Other | Admitting: Family Medicine

## 2021-11-07 ENCOUNTER — Ambulatory Visit: Payer: Medicare Other | Admitting: Family Medicine

## 2021-11-07 ENCOUNTER — Other Ambulatory Visit: Payer: Self-pay

## 2021-11-07 ENCOUNTER — Encounter: Payer: Self-pay | Admitting: Family Medicine

## 2021-11-07 VITALS — BP 122/64 | HR 73 | Temp 97.8°F | Ht 72.0 in | Wt 164.2 lb

## 2021-11-07 DIAGNOSIS — I482 Chronic atrial fibrillation, unspecified: Secondary | ICD-10-CM

## 2021-11-07 DIAGNOSIS — Z7901 Long term (current) use of anticoagulants: Secondary | ICD-10-CM

## 2021-11-07 DIAGNOSIS — R791 Abnormal coagulation profile: Secondary | ICD-10-CM | POA: Diagnosis not present

## 2021-11-07 LAB — POCT INR: INR: 1.1 — AB (ref 2–3)

## 2021-11-07 LAB — COAGUCHEK XS/INR WAIVED
INR: 1.2 — ABNORMAL HIGH (ref 0.9–1.1)
Prothrombin Time: 14.8 s

## 2021-11-07 NOTE — Progress Notes (Signed)
Subjective: CC: Afib PCP: [name], DO HPI:Dalton Vaughan is a 87 y.o. male presenting to clinic today for:  1. Afib Patient here for INR testing.  He was recently placed on antibiotics for diverticulitis.  He notes that his left lower quadrant pain has essentially resolved.  He has been off of his antibiotics for 4 days.  He does admit that he incorporated more greens because the last time he was on antibiotics he actually had a supratherapeutic INR and was hoping to avoid an abnormal reading today.  No bleeding reported.  No chest pain or shortness of breath reported.  Currently taking Coumadin 5 mg daily.   ROS: Per HPI  Allergies  Allergen Reactions   Paxil [Paroxetine Hcl] Palpitations   Eliquis [Apixaban] Other (See Comments)    dizziness   Penicillins Other (See Comments)    Did it involve swelling of the face/tongue/throat, SOB, or low BP? Unknown Did it involve sudden or severe rash/hives, skin peeling, or any reaction on the inside of your mouth or nose? Unknown Did you need to seek medical attention at a hospital or doctor's office? Unknown When did it last happen?      unk If all above answers are "NO", may proceed with cephalosporin use.    Pravachol [Pravastatin Sodium] Other (See Comments)    myalgias   Zetia [Ezetimibe] Other (See Comments)    myalgias   Vytorin [Ezetimibe-Simvastatin] Other (See Comments)    myalgias   Past Medical History:  Diagnosis Date   Anal fissure    Arthritis    Atrial fibrillation (HCC)    BPH (benign prostatic hypertrophy)    CAD (coronary artery disease)    Dr. Wynonia Lawman   Cataract    COVID-19    Depression 2004   Diverticulosis    GERD (gastroesophageal reflux disease)    History of TIAs    Hyperlipidemia    Hypertension    Hypertensive heart disease without CHF    Hypothyroidism    Internal hemorrhoids    Lumbar disc disease    Oral bleeding 08/20/2012   Following molar tooth extraction July, 2013   Pneumonia     Ruptured disk 1995   Shingles    Thyroid disease    Vitamin D deficiency    Vitreous detachment     Current Outpatient Medications:    amLODipine (NORVASC) 5 MG tablet, Take 1 tablet (5 mg total) by mouth daily., Disp: 90 tablet, Rfl: 1   Calcium Carb-Cholecalciferol (CALCIUM + D3) 600-200 MG-UNIT TABS, Take 1 tablet by mouth daily. , Disp: , Rfl:    Cholecalciferol (VITAMIN D-3) 5000 UNITS TABS, Take 1 tablet by mouth daily., Disp: , Rfl:    doxazosin (CARDURA) 8 MG tablet, TAKE 1 TABLET AT BEDTIME, Disp: 90 tablet, Rfl: 3   mirtazapine (REMERON) 15 MG tablet, TAKE 1 TABLET AT BEDTIME, Disp: 90 tablet, Rfl: 3   Multiple Vitamin (MULTIVITAMIN) tablet, Take 1 tablet by mouth daily., Disp: , Rfl:    ramipril (ALTACE) 10 MG capsule, Take 1 capsule (10 mg total) by mouth daily., Disp: 90 capsule, Rfl: 3   SIMETHICONE PO, Take by mouth as needed., Disp: , Rfl:    SYNTHROID 50 MCG tablet, TAKE 1 TABLET DAILY BEFORE BREAKFAST, Disp: 90 tablet, Rfl: 3   triamterene-hydrochlorothiazide (MAXZIDE-25) 37.5-25 MG tablet, TAKE 1 TABLET DAILY, Disp: 90 tablet, Rfl: 0   vitamin C (ASCORBIC ACID) 500 MG tablet, Take 500 mg by mouth daily. , Disp: , Rfl:  warfarin (COUMADIN) 5 MG tablet, Take 1 tablet (24m) by mouth daily as directed, Disp: 90 tablet, Rfl: 1   XIIDRA 5 % SOLN, Apply 1 drop to eye as needed. 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  Tobacco Use   Smoking status: Former    Packs/day: 1.00    Years: 18.00    Pack years: 18.00    Types: Cigarettes    Start date: 12/17/1948    Quit date: 12/17/1966    Years since quitting: 54.9   Smokeless tobacco: Never  Vaping Use    Vaping Use: Never used  Substance and Sexual Activity   Alcohol use: Yes    Alcohol/week: 1.0 standard drink    Types: 1 Standard drinks or equivalent per week    Comment: rare   Drug use: No   Sexual activity: Not on file  Other Topics Concern   Not on file  Social History Narrative   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, musicat [employer]     Right-handed      Caffeine use: none      Son and grandson live with him   Social Determinants of HRadio broadcast assistantStrain: Low Risk    Difficulty of Paying Living Expenses: Not hard at all  Food Insecurity: No Food Insecurity   Worried About RCharity fundraiserin the Last Year: Never true   RArboriculturistin the Last Year: Never true  Transportation Needs: No Transportation Needs   Lack of Transportation (Medical): No   Lack of Transportation (Non-Medical): No  Physical Activity: Sufficiently Active   Days of Exercise per Week: 5 days   Minutes of Exercise per Session: 50 min  Stress: No Stress Concern Present   Feeling of Stress : Only a little  Social Connections: Moderately Isolated   Frequency of Communication with Friends and Family: More than three times a week   Frequency of Social Gatherings with Friends and Family: More than three times a week   Attends Religious Services: Never   AMarine scientistor Organizations: Yes   Attends CArchivistMeetings: 1 to 4 times per year   Marital Status: Widowed  IHuman resources officerViolence: Not At Risk   Fear of Current or Ex-Partner: No   Emotionally Abused: No   Physically Abused: No   Sexually Abused: No   Family History  Problem Relation Age of Onset   Dementia Mother    Arthritis Mother    Cancer Brother        prostate   Asthma Sister    Heart disease Paternal Aunt    Heart disease Paternal Uncle    Hypertension Maternal Grandmother    CVA Maternal Grandmother    CVA Paternal Grandmother    Hepatitis C Son    Arthritis Son     Colon cancer Neg Hx    Colon polyps Neg Hx    Kidney disease Neg Hx    Esophageal cancer Neg Hx    Gallbladder disease Neg Hx  Diabetes Neg Hx     Objective: Office vital signs reviewed. BP 122/64   Pulse 73   Temp 97.8 F (36.6 C) (Temporal)   Ht 6' (1.829 m)   Wt 164 lb 3.2 oz (74.5 kg)   SpO2 98%   BMI 22.27 kg/m   Physical Examination:  General: Awake, alert, thin elderly male, No acute distress HEENT: Normal, sclera white, MMM Cardio: regular rate and rhythm, S1S2 heard, no murmurs appreciated Pulm: clear to auscultation bilaterally, no wheezes, rhonchi or rales; normal work of breathing on room air   Assessment/ Plan: 87 y.o. male   Subtherapeutic international normalized ratio (INR)  Chronic atrial fibrillation (HCC) - Plan: CoaguChek XS/INR Waived, POCT INR  Chronic anticoagulation - Plan: CoaguChek XS/INR Waived, POCT INR  Increase to Coumadin 10 mg today and tomorrow then resume 5 mg daily.  Follow-up visit scheduled for next Tuesday with Monia Pouch and he will see me on the 14th if his INR still is not at goal.  Otherwise may space back out to 6-week checkups  No orders of the defined types were placed in this encounter.  No orders of the defined types were placed in this encounter.    [name], DO [place] [phone]

## 2021-11-08 ENCOUNTER — Encounter: Payer: Self-pay | Admitting: Gastroenterology

## 2021-11-08 ENCOUNTER — Ambulatory Visit (INDEPENDENT_AMBULATORY_CARE_PROVIDER_SITE_OTHER): Payer: Medicare Other | Admitting: Gastroenterology

## 2021-11-08 ENCOUNTER — Other Ambulatory Visit (INDEPENDENT_AMBULATORY_CARE_PROVIDER_SITE_OTHER): Payer: Medicare Other

## 2021-11-08 VITALS — BP 128/80 | HR 89 | Ht 72.0 in | Wt 164.4 lb

## 2021-11-08 DIAGNOSIS — R1032 Left lower quadrant pain: Secondary | ICD-10-CM

## 2021-11-08 LAB — BASIC METABOLIC PANEL
BUN: 8 mg/dL (ref 6–23)
CO2: 31 mEq/L (ref 19–32)
Calcium: 9.4 mg/dL (ref 8.4–10.5)
Chloride: 99 mEq/L (ref 96–112)
Creatinine, Ser: 0.72 mg/dL (ref 0.40–1.50)
GFR: 80.52 mL/min (ref >60.00)
Glucose, Bld: 90 mg/dL (ref 70–99)
Potassium: 3.5 mEq/L (ref 3.5–5.1)
Sodium: 136 mEq/L (ref 135–145)

## 2021-11-08 NOTE — Progress Notes (Signed)
11/08/2021 Dalton Vaughan [id number] 1929/07/28   HISTORY OF PRESENT ILLNESS: This is a 87 year old male who is a patient of Dr. Vena Rua.  He typically follows here for issues with symptomatic hemorrhoids and esophageal dysmotility.  This time he is here with complaints of left lower quadrant abdominal pain.  He says that he started to develop this pain back in August and was treated with a course of Cipro and Flagyl.  He said that things seem to straighten out and he was doing well.  Then earlier this month he started having the same left lower quadrant abdominal pain again.  He says that it seemed to go around his left side and into his left back somewhat.  He was treated with Cipro and Flagyl again which he completed almost a week ago.  He says that it did help, but he still has some slight discomfort there; it did not 100% resolve.  He says that he moves his bowels fairly regularly.  Denies any rectal bleeding.  He had a flexible sigmoidoscopy in April 2016 that showed sigmoid diverticulosis.  Past Medical History:  Diagnosis Date   Anal fissure    Arthritis    Atrial fibrillation (HCC)    BPH (benign prostatic hypertrophy)    CAD (coronary artery disease)    Dr. Wynonia Lawman   Cataract    COVID-19    Depression 2004   Diverticulosis    GERD (gastroesophageal reflux disease)    History of TIAs    Hyperlipidemia    Hypertension    Hypertensive heart disease without CHF    Hypothyroidism    Internal hemorrhoids    Lumbar disc disease    Oral bleeding 08/20/2012   Following molar tooth extraction July, 2013   Pneumonia    Ruptured disk 1995   Shingles    Thyroid disease    Vitamin D deficiency    Vitreous detachment    Past Surgical History:  Procedure Laterality Date   bilateral cataract surgery  6/07   CARDIAC CATHETERIZATION     CORONARY ARTERY BYPASS GRAFT  12/99    Dr. Servando Snare    cysloscopy  8/08   [place]   right   herninated disc  [place]  11/03/2020   PROSTATE BIOPSY  8/08   TONSILLECTOMY AND ADENOIDECTOMY      reports that he quit smoking about 54 years ago. His smoking use included cigarettes. He started smoking about 72 years ago. He has a 18.00 pack-year smoking history. He has never used smokeless tobacco. He reports current alcohol use of about 1.0 standard drink per week. He reports that he does not use drugs. family history includes Arthritis in his mother and son; Asthma in his sister; CVA in his maternal grandmother and paternal grandmother; Cancer in his brother; Dementia in his mother; Heart disease in his paternal aunt and paternal uncle; Hepatitis C in his son; Hypertension in his maternal grandmother. Allergies  Allergen Reactions   Paxil [Paroxetine Hcl] Palpitations   Eliquis [Apixaban] Other (See Comments)    dizziness   Penicillins Other (See Comments)    Did it involve swelling of the face/tongue/throat, SOB, or low BP? Unknown Did it involve sudden or severe rash/hives, skin peeling, or any reaction on the inside of your mouth or nose? Unknown Did you need to seek medical attention at a hospital or doctor's office? Unknown When did it last happen?  unk If all above answers are "NO", may proceed with cephalosporin use.    Pravachol [Pravastatin Sodium] Other (See Comments)    myalgias   Zetia [Ezetimibe] Other (See Comments)    myalgias   Vytorin [Ezetimibe-Simvastatin] Other (See Comments)    myalgias      Outpatient Encounter Medications as of 11/08/2021  Medication Sig   Calcium Carb-Cholecalciferol (CALCIUM + D3) 600-200 MG-UNIT TABS Take 1 tablet by mouth daily.    Cholecalciferol (VITAMIN D-3) 5000 UNITS TABS Take 1 tablet by mouth daily.   doxazosin (CARDURA) 8 MG tablet TAKE 1 TABLET AT BEDTIME   mirtazapine (REMERON) 15 MG tablet TAKE 1 TABLET AT BEDTIME   Multiple Vitamin (MULTIVITAMIN) tablet Take 1 tablet by mouth daily.   ramipril (ALTACE) 10 MG capsule  Take 1 capsule (10 mg total) by mouth daily.   SIMETHICONE PO Take by mouth as needed.   SYNTHROID 50 MCG tablet TAKE 1 TABLET DAILY BEFORE BREAKFAST   triamterene-hydrochlorothiazide (MAXZIDE-25) 37.5-25 MG tablet TAKE 1 TABLET DAILY   vitamin C (ASCORBIC ACID) 500 MG tablet Take 500 mg by mouth daily.    warfarin (COUMADIN) 5 MG tablet Take 1 tablet (71m) by mouth daily as directed   XIIDRA 5 % SOLN Apply 1 drop to eye as needed.    amLODipine (NORVASC) 5 MG tablet Take 1 tablet (5 mg total) by mouth daily.   No facility-administered encounter medications on file as of 11/08/2021.     REVIEW OF SYSTEMS  : All other systems reviewed and negative except where noted in the History of Present Illness.   PHYSICAL EXAM: BP 128/80   Pulse 89   Ht 6' (1.829 m)   Wt 164 lb 6.4 oz (74.6 kg)   SpO2 99%   BMI 22.30 kg/m  General: Well developed white male in no acute distress Head: Normocephalic and atraumatic Eyes:  Sclerae anicteric, conjunctiva pink. Ears: Normal auditory acuity Lungs: Clear throughout to auscultation; no W/R/R. Heart: Regular rate and rhythm; no M/R/G. Abdomen: Soft, non-distended.  BS present.  Mild LLQ TTP. Musculoskeletal: Symmetrical with no gross deformities  Skin: No lesions on visible extremities Extremities: No edema  Neurological: Alert oriented x 4, grossly non-focal Psychological:  Alert and cooperative. Normal mood and affect  ASSESSMENT AND PLAN: *87year old male with left lower quadrant abdominal pain.  Had an episode in August that resolved with antibiotics.  Recently had another episode that has not 100% completely resolved with a course of antibiotics (cipro and flagyl).  Does have sigmoid diverticulosis seen on flexible sigmoidoscopy in 2016.  We will plan for CT scan of the abdomen and pelvis with contrast to rule out ongoing diverticulitis, any abscess or complication or other issues.  We will check a BMP today.  We discussed a low fiber/low  residue diet for the next several days.   CC:  GJanora Norlander DO

## 2021-11-08 NOTE — Patient Instructions (Signed)
You have been scheduled for a CT scan of the abdomen and pelvis at [facility] [address], [address], [place],  [address], 1st floor Radiology. You are scheduled on Tuesday 11/14/21  at 1:30 pm. You should arrive 15 minutes prior to your appointment time for registration.  Please follow the written instructions below on the day of your exam:   1) Do not eat anything after 9:30 am (4 hours prior to your test)   2) Drink 1 bottle of contrast @ 11:30 am (2 hours prior to your exam)  Remember to shake well before drinking and do NOT pour over ice.     Drink 1 bottle of contrast @ 12:30 pm (1 hour prior to your exam)   You may take any medications as prescribed with a small amount of water, if necessary. If you take any of the following medications: METFORMIN, GLUCOPHAGE, GLUCOVANCE, AVANDAMET, RIOMET, FORTAMET, [place] MET, JANUMET, GLUMETZA or METAGLIP, you MAY be asked to HOLD this medication 48 hours AFTER the exam.   The purpose of you drinking the oral contrast is to aid in the visualization of your intestinal tract. The contrast solution may cause some diarrhea. Depending on your individual set of symptoms, you may also receive an intravenous injection of x-Zyan contrast/dye. Plan on being at [facility] for 45 minutes or longer, depending on the type of exam you are having performed.   If you have any questions regarding your exam or if you need to reschedule, you may call [facility] at [phone] between the hours of 8:00 am and 5:00 pm, Monday-Friday.

## 2021-11-13 NOTE — Progress Notes (Signed)
Addendum: Reviewed and agree with assessment and management plan. Maurisio Ruddy M, MD  

## 2021-11-14 ENCOUNTER — Ambulatory Visit (HOSPITAL_BASED_OUTPATIENT_CLINIC_OR_DEPARTMENT_OTHER)
Admission: RE | Admit: 2021-11-14 | Discharge: 2021-11-14 | Disposition: A | Discharge status: Home / Self Care | Payer: Medicare Other | Source: Ambulatory Visit | Attending: Gastroenterology | Admitting: Gastroenterology

## 2021-11-14 ENCOUNTER — Encounter: Payer: Self-pay | Admitting: Family Medicine

## 2021-11-14 ENCOUNTER — Other Ambulatory Visit: Payer: Self-pay

## 2021-11-14 ENCOUNTER — Ambulatory Visit (INDEPENDENT_AMBULATORY_CARE_PROVIDER_SITE_OTHER): Payer: Medicare Other | Admitting: Family Medicine

## 2021-11-14 VITALS — BP 135/78 | HR 79 | Temp 98.1°F | Ht 72.0 in | Wt 162.0 lb

## 2021-11-14 DIAGNOSIS — N400 Enlarged prostate without lower urinary tract symptoms: Secondary | ICD-10-CM | POA: Diagnosis not present

## 2021-11-14 DIAGNOSIS — K5900 Constipation, unspecified: Secondary | ICD-10-CM | POA: Diagnosis not present

## 2021-11-14 DIAGNOSIS — I482 Chronic atrial fibrillation, unspecified: Secondary | ICD-10-CM

## 2021-11-14 DIAGNOSIS — Z7901 Long term (current) use of anticoagulants: Secondary | ICD-10-CM

## 2021-11-14 DIAGNOSIS — R1032 Left lower quadrant pain: Secondary | ICD-10-CM | POA: Insufficient documentation

## 2021-11-14 DIAGNOSIS — K5730 Diverticulosis of large intestine without perforation or abscess without bleeding: Secondary | ICD-10-CM | POA: Diagnosis not present

## 2021-11-14 LAB — COAGUCHEK XS/INR WAIVED
INR: 2.1 — ABNORMAL HIGH (ref 0.9–1.1)
Prothrombin Time: 25.2 s

## 2021-11-14 LAB — POCT INR: INR: 2.1 (ref 2.0–3.0)

## 2021-11-14 MED ORDER — IOHEXOL 300 MG/ML  SOLN
100.0000 mL | Freq: Once | INTRAMUSCULAR | Status: AC | PRN
  Administered 2021-11-14: 85 mL via INTRAVENOUS

## 2021-11-14 NOTE — Progress Notes (Signed)
Subjective:  Patient ID: Dalton Vaughan, male    DOB: 03-25-1929, 87 y.o.   MRN: [id number]  Patient Care Team: [name], DO as PCP - General (Family Medicine) Raynelle Bring, MD as Consulting Physician (Urology) Pyrtle, Lajuan Lines, MD as Consulting Physician (Gastroenterology) Clent Jacks, MD as Consulting Physician (Ophthalmology) Loni Beckwith Neita Goodnight., MD as Referring Physician (Cardiology)   Chief Complaint:  Coagulation Disorder   HPI: Dalton Vaughan is a 87 y.o. male presenting on 11/14/2021 for Coagulation Disorder   Pt presents today for INR recheck, was suptherapeutic at last visit due to antibiotic therapy. He took 10 mg for 2 days and then returned to 5 mg daily dosing. He denies any changes in diet or new medications.  No abnormal bleeding or bruising. He denies chest pain, palpitations, shortness of breath, dizziness, or syncope. He has been taking medications as prescribed without noted side effects.     Relevant past medical, surgical, family, and social history reviewed and updated as indicated.  Allergies and medications reviewed and updated. Data reviewed: Chart in Epic.   Past Medical History:  Diagnosis Date   Anal fissure    Arthritis    Atrial fibrillation (HCC)    BPH (benign prostatic hypertrophy)    CAD (coronary artery disease)    Dr. Wynonia Lawman   Cataract    COVID-19    Depression 2004   Diverticulosis    GERD (gastroesophageal reflux disease)    History of TIAs    Hyperlipidemia    Hypertension    Hypertensive heart disease without CHF    Hypothyroidism    Internal hemorrhoids    Lumbar disc disease    Oral bleeding 08/20/2012   Following molar tooth extraction July, 2013   Pneumonia    Ruptured disk 1995   Shingles    Thyroid disease    Vitamin D deficiency    Vitreous detachment     Past Surgical History:  Procedure Laterality Date   bilateral cataract surgery  6/07   CARDIAC CATHETERIZATION     CORONARY ARTERY BYPASS  GRAFT  12/99    Dr. 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  Tobacco Use   Smoking status: Former    Packs/day: 1.00    Years: 18.00    Pack years: 18.00    Types: Cigarettes    Start date: 12/17/1948    Quit date: 12/17/1966    Years since quitting: 54.9   Smokeless tobacco: Never  Vaping Use   Vaping Use: Never used  Substance and Sexual Activity   Alcohol use: Yes    Alcohol/week: 1.0 standard drink    Types: 1 Standard drinks or equivalent per week    Comment: rare   Drug use: No   Sexual activity: Not on file  Other Topics Concern  Not on file  Social History Narrative   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, music at [employer]      Right-handed      Caffeine use: none      Son and grandson live with him   Social Determinants of Radio broadcast assistant Strain: Low Risk    Difficulty of Paying Living Expenses: Not hard at all  Food Insecurity: No Food Insecurity   Worried About Charity fundraiser in the Last Year: Never true   Arboriculturist in the Last Year: Never true  Transportation Needs: No Transportation Needs   Lack of Transportation (Medical): No   Lack of Transportation (Non-Medical): No  Physical Activity: Sufficiently Active   Days of Exercise per Week: 5 days   Minutes of Exercise per Session: 50 min  Stress: No Stress Concern Present   Feeling of Stress : Only a little  Social Connections: Moderately Isolated   Frequency of  Communication with Friends and Family: More than three times a week   Frequency of Social Gatherings with Friends and Family: More than three times a week   Attends Religious Services: Never   Marine scientist or Organizations: Yes   Attends Archivist Meetings: 1 to 4 times per year   Marital Status: Widowed  Intimate Partner Violence: Not At Risk   Fear of Current or Ex-Partner: No   Emotionally Abused: No   Physically Abused: No   Sexually Abused: No    Outpatient Encounter Medications as of 11/14/2021  Medication Sig   Calcium Carb-Cholecalciferol (CALCIUM + D3) 600-200 MG-UNIT TABS Take 1 tablet by mouth daily.    Cholecalciferol (VITAMIN D-3) 5000 UNITS TABS Take 1 tablet by mouth daily.   doxazosin (CARDURA) 8 MG tablet TAKE 1 TABLET AT BEDTIME   mirtazapine (REMERON) 15 MG tablet TAKE 1 TABLET AT BEDTIME   Multiple Vitamin (MULTIVITAMIN) tablet Take 1 tablet by mouth daily.   ramipril (ALTACE) 10 MG capsule Take 1 capsule (10 mg total) by mouth daily.   SIMETHICONE PO Take by mouth as needed.   SYNTHROID 50 MCG tablet TAKE 1 TABLET DAILY BEFORE BREAKFAST   triamterene-hydrochlorothiazide (MAXZIDE-25) 37.5-25 MG tablet TAKE 1 TABLET DAILY   vitamin C (ASCORBIC ACID) 500 MG tablet Take 500 mg by mouth daily.    warfarin (COUMADIN) 5 MG tablet Take 1 tablet (34m) by mouth daily as directed   XIIDRA 5 % SOLN Apply 1 drop to eye as needed.    amLODipine (NORVASC) 5 MG tablet Take 1 tablet (5 mg total) by mouth daily.   No facility-administered encounter medications on file as of 11/14/2021.    Allergies  Allergen Reactions   Paxil [Paroxetine Hcl] Palpitations   Eliquis [Apixaban] Other (See Comments)    dizziness   Penicillins Other (See Comments)    Did it involve swelling of the face/tongue/throat, SOB, or low BP? Unknown Did it involve sudden or severe rash/hives, skin peeling, or any reaction on the inside of your mouth or nose? Unknown Did you need to  seek medical attention at a hospital or doctor's office? Unknown When did it last happen?      unk If all above answers are "NO", may proceed with cephalosporin use.    Pravachol [Pravastatin Sodium] Other (See Comments)    myalgias   Zetia [Ezetimibe] Other (See Comments)    myalgias   Vytorin [Ezetimibe-Simvastatin] Other (See Comments)    myalgias  Review of Systems  Constitutional:  Negative for activity change, appetite change, chills, diaphoresis, fatigue, fever and unexpected weight change.  HENT: Negative.    Eyes: Negative.   Respiratory:  Negative for cough, chest tightness and shortness of breath.   Cardiovascular:  Negative for chest pain, palpitations and leg swelling.  Gastrointestinal:  Negative for anal bleeding, blood in stool, constipation, diarrhea, nausea and vomiting.  Endocrine: Negative.   Genitourinary:  Negative for dysuria, frequency, hematuria and urgency.  Musculoskeletal:  Negative for arthralgias and myalgias.  Skin: Negative.   Allergic/Immunologic: Negative.   Neurological:  Negative for dizziness, weakness and headaches.  Hematological: Negative.   Psychiatric/Behavioral:  Negative for confusion, hallucinations, sleep disturbance and suicidal ideas.   All other systems reviewed and are negative.      Objective:  BP 135/78   Pulse 79   Temp 98.1 F (36.7 C)   Ht 6' (1.829 m)   Wt 162 lb (73.5 kg)   SpO2 98%   BMI 21.97 kg/m    Wt Readings from Last 3 Encounters:  11/14/21 162 lb (73.5 kg)  11/08/21 164 lb 6.4 oz (74.6 kg)  11/07/21 164 lb 3.2 oz (74.5 kg)    Physical Exam Constitutional:      General: He is not in acute distress.    Appearance: Normal appearance. He is normal weight. He is not ill-appearing, toxic-appearing or diaphoretic.  HENT:     Head: Normocephalic and atraumatic.     Mouth/Throat:     Mouth: Mucous membranes are moist.  Eyes:     Conjunctiva/sclera: Conjunctivae normal.     Pupils: Pupils are equal,  round, and reactive to light.  Cardiovascular:     Rate and Rhythm: Normal rate. Rhythm irregularly irregular.     Heart sounds: Normal heart sounds.  Pulmonary:     Effort: Pulmonary effort is normal.     Breath sounds: Normal breath sounds.  Musculoskeletal:     Right lower leg: No edema.     Left lower leg: No edema.  Skin:    General: Skin is warm and dry.     Capillary Refill: Capillary refill takes less than 2 seconds.  Neurological:     General: No focal deficit present.     Mental Status: He is alert and oriented to person, place, and time.  Psychiatric:        Mood and Affect: Mood normal.        Behavior: Behavior normal.        Thought Content: Thought content normal.        Judgment: Judgment normal.    Results for orders placed or performed in visit on 11/14/21  POCT INR  Result Value Ref Range   INR 2.1 2.0 - 3.0       Pertinent labs & imaging results that were available during my care of the patient were reviewed by me and considered in my medical decision making.  Assessment & Plan:  Wilian was seen today for coagulation disorder.  Diagnoses and all orders for this visit:  Chronic atrial fibrillation (HCC) Chronic anticoagulation INR 2.1 today, continue 5 mg daily dosing. Repeat INR in 6 weeks.  -     CoaguChek XS/INR Waived -     POCT INR    Continue all other maintenance medications.  Follow up plan: Return in about 6 weeks (around 12/26/2021), or if symptoms worsen or fail to improve, for INR.   Continue healthy lifestyle choices, including diet (rich  in fruits, vegetables, and lean proteins, and low in salt and simple carbohydrates) and exercise (at least 30 minutes of moderate physical activity daily).   The above assessment and management plan was discussed with the patient. The patient verbalized understanding of and has agreed to the management plan. Patient is aware to call the clinic if they develop any new symptoms or if symptoms persist or  worsen. Patient is aware when to return to the clinic for a follow-up visit. Patient educated on when it is appropriate to go to the emergency department.   Monia Pouch, FNP-C [facility] [phone]

## 2021-11-15 DIAGNOSIS — R972 Elevated prostate specific antigen [PSA]: Secondary | ICD-10-CM | POA: Diagnosis not present

## 2021-11-22 DIAGNOSIS — N401 Enlarged prostate with lower urinary tract symptoms: Secondary | ICD-10-CM | POA: Diagnosis not present

## 2021-11-22 DIAGNOSIS — R972 Elevated prostate specific antigen [PSA]: Secondary | ICD-10-CM | POA: Diagnosis not present

## 2021-11-22 DIAGNOSIS — R351 Nocturia: Secondary | ICD-10-CM | POA: Diagnosis not present

## 2021-11-23 ENCOUNTER — Ambulatory Visit (INDEPENDENT_AMBULATORY_CARE_PROVIDER_SITE_OTHER): Payer: Medicare Other | Admitting: Family Medicine

## 2021-11-23 ENCOUNTER — Encounter: Payer: Self-pay | Admitting: Family Medicine

## 2021-11-23 ENCOUNTER — Other Ambulatory Visit: Payer: Self-pay

## 2021-11-23 VITALS — BP 125/70 | HR 73 | Temp 97.3°F | Ht 72.0 in | Wt 168.5 lb

## 2021-11-23 DIAGNOSIS — M542 Cervicalgia: Secondary | ICD-10-CM | POA: Diagnosis not present

## 2021-11-23 MED ORDER — PREDNISONE 20 MG PO TABS: 20.0000 mg | ORAL_TABLET | Freq: Every day | ORAL | 0 refills | Status: AC

## 2021-11-23 MED ORDER — LIDOCAINE 5 % EX OINT: 1.0000 "application " | TOPICAL_OINTMENT | CUTANEOUS | 0 refills | Status: DC | PRN

## 2021-11-23 NOTE — Progress Notes (Signed)
Acute Office Visit  Subjective:    Patient ID: Dalton Vaughan, male    DOB: 10-May-1929, 87 y.o.   MRN: [id number]  Chief Complaint  Patient presents with   Neck Pain    HPI Patient is in today for neck pain x 6 days. The pain started after folding some laundry. The pain is sharp. The pain is a 7/10. It is worse with movement or rotation. It sometimes radiates down his shoulders. He sometimes has spasms in his neck. He has been taking tylenol and tizanidine. This has not really been helping. Overall the pain feels slightly better today. He denies fever, erythema, swelling, injury, numbness, tingling, HA, dizziness, or erythema. He reports a hx of arthritis in his neck.   Past Medical History:  Diagnosis Date   Anal fissure    Arthritis    Atrial fibrillation (HCC)    BPH (benign prostatic hypertrophy)    CAD (coronary artery disease)    Dr. Wynonia Lawman   Cataract    COVID-19    Depression 2004   Diverticulosis    GERD (gastroesophageal reflux disease)    History of TIAs    Hyperlipidemia    Hypertension    Hypertensive heart disease without CHF    Hypothyroidism    Internal hemorrhoids    Lumbar disc disease    Oral bleeding 08/20/2012   Following molar tooth extraction July, 2013   Pneumonia    Ruptured disk 1995   Shingles    Thyroid disease    Vitamin D deficiency    Vitreous detachment     Past Surgical History:  Procedure Laterality Date   bilateral cataract surgery  6/07   CARDIAC CATHETERIZATION     CORONARY ARTERY BYPASS GRAFT  12/99    Dr. Servando Snare    cysloscopy  8/08   EYE SURGERY     HERNIA REPAIR  1997   right   herninated disc  1995   PACEMAKER INSERTION  11/03/2020   PROSTATE BIOPSY  8/08   TONSILLECTOMY AND ADENOIDECTOMY      Family History  Problem Relation Age of Onset   Dementia Mother    Arthritis Mother    Cancer Brother        prostate   Asthma Sister    Heart disease Paternal Aunt    Heart disease Paternal Uncle    Hypertension  Maternal Grandmother    CVA Maternal Grandmother    CVA Paternal Grandmother    Hepatitis C Son    Arthritis Son    Colon cancer Neg Hx    Colon polyps Neg Hx    Kidney disease Neg Hx    Esophageal cancer Neg Hx    Gallbladder disease Neg Hx    Diabetes Neg Hx     Social History   Socioeconomic History   Marital status: Widowed    Spouse name: [name]    Number of children: 1   Years of education: 12   Highest education level: Not on file  Occupational History   Occupation: Retired    Hydrologist: Social research officer, government x 27 years   Occupation: Retired    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  Tobacco Use   Smoking status: Former    Packs/day: 1.00    Years: 18.00    Pack years: 18.00    Types: Cigarettes    Start date: 12/17/1948    Quit date: 12/17/1966    Years since quitting: 32.9  Smokeless tobacco: Never  Vaping Use   Vaping Use: Never used  Substance and Sexual Activity   Alcohol use: Yes    Alcohol/week: 1.0 standard drink    Types: 1 Standard drinks or equivalent per week    Comment: rare   Drug use: No   Sexual activity: Not on file  Other Topics Concern   Not on file  Social History Narrative   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, music at [employer]      Right-handed      Caffeine use: none      Son and grandson live with him   Social Determinants of Radio broadcast assistant Strain: Low Risk    Difficulty of Paying Living Expenses: Not hard at all  Food Insecurity: No Food Insecurity   Worried About Charity fundraiser in the Last Year: Never true   Arboriculturist in the Last Year: Never true  Transportation Needs: No Transportation Needs   Lack of Transportation (Medical): No   Lack of Transportation (Non-Medical): No  Physical Activity: Sufficiently Active   Days of Exercise per Week: 5 days   Minutes of Exercise per Session: 50 min  Stress: No Stress Concern Present   Feeling of Stress : Only a little  Social  Connections: Moderately Isolated   Frequency of Communication with Friends and Family: More than three times a week   Frequency of Social Gatherings with Friends and Family: More than three times a week   Attends Religious Services: Never   Marine scientist or Organizations: Yes   Attends Archivist Meetings: 1 to 4 times per year   Marital Status: Widowed  Human resources officer Violence: Not At Risk   Fear of Current or Ex-Partner: No   Emotionally Abused: No   Physically Abused: No   Sexually Abused: No    Outpatient Medications Prior to Visit  Medication Sig Dispense Refill   amLODipine (NORVASC) 5 MG tablet Take 1 tablet (5 mg total) by mouth daily. 90 tablet 1   Calcium Carb-Cholecalciferol (CALCIUM + D3) 600-200 MG-UNIT TABS Take 1 tablet by mouth daily.      Cholecalciferol (VITAMIN D-3) 5000 UNITS TABS Take 1 tablet by mouth daily.     doxazosin (CARDURA) 8 MG tablet TAKE 1 TABLET AT BEDTIME 90 tablet 3   mirtazapine (REMERON) 15 MG tablet TAKE 1 TABLET AT BEDTIME 90 tablet 3   Multiple Vitamin (MULTIVITAMIN) tablet Take 1 tablet by mouth daily.     ramipril (ALTACE) 10 MG capsule Take 1 capsule (10 mg total) by mouth daily. 90 capsule 3   SIMETHICONE PO Take by mouth as needed.     SYNTHROID 50 MCG tablet TAKE 1 TABLET DAILY BEFORE BREAKFAST 90 tablet 3   triamterene-hydrochlorothiazide (MAXZIDE-25) 37.5-25 MG tablet TAKE 1 TABLET DAILY 90 tablet 0   vitamin C (ASCORBIC ACID) 500 MG tablet Take 500 mg by mouth daily.      warfarin (COUMADIN) 5 MG tablet Take 1 tablet (65m) by mouth daily as directed 90 tablet 1   XIIDRA 5 % SOLN Apply 1 drop to eye as needed.      No facility-administered medications prior to visit.    Allergies  Allergen Reactions   Paxil [Paroxetine Hcl] Palpitations   Eliquis [Apixaban] Other (See Comments)    dizziness   Penicillins Other (See Comments)    Did it involve swelling of the face/tongue/throat, SOB, or low BP? Unknown Did it  involve sudden or severe rash/hives, skin peeling, or any reaction on the inside of your mouth or nose? Unknown Did you need to seek medical attention at a hospital or doctor's office? Unknown When did it last happen?      unk If all above answers are "NO", may proceed with cephalosporin use.    Pravachol [Pravastatin Sodium] Other (See Comments)    myalgias   Zetia [Ezetimibe] Other (See Comments)    myalgias   Vytorin [Ezetimibe-Simvastatin] Other (See Comments)    myalgias    Review of Systems As per HPI.     Objective:    Physical Exam Vitals and nursing note reviewed.  Constitutional:      General: He is not in acute distress.    Appearance: He is not ill-appearing, toxic-appearing or diaphoretic.  Cardiovascular:     Rate and Rhythm: Normal rate and regular rhythm.     Heart sounds: Normal heart sounds. No murmur heard. Pulmonary:     Effort: Pulmonary effort is normal. No respiratory distress.     Breath sounds: Normal breath sounds.  Musculoskeletal:     Cervical back: Spasms and tenderness (muscular) present. No swelling, edema, deformity, erythema, signs of trauma, rigidity or bony tenderness. Pain with movement (with rotation and flexion) present.     Right lower leg: No edema.     Left lower leg: No edema.  Skin:    General: Skin is warm and dry.  Neurological:     Mental Status: He is alert and oriented to person, place, and time. Mental status is at baseline.  Psychiatric:        Mood and Affect: Mood normal.        Behavior: Behavior normal.    BP 125/70   Pulse 73   Temp (!) 97.3 F (36.3 C) (Temporal)   Ht 6' (1.829 m)   Wt 168 lb 8 oz (76.4 kg)   BMI 22.85 kg/m  Wt Readings from Last 3 Encounters:  11/23/21 168 lb 8 oz (76.4 kg)  11/14/21 162 lb (73.5 kg)  11/08/21 164 lb 6.4 oz (74.6 kg)    Health Maintenance Due  Topic Date Due   COVID-19 Vaccine (3 - Moderna risk series) 04/12/2020    There are no preventive care reminders to display  for this patient.   Lab Results  Component Value Date   TSH 4.550 (H) 10/18/2021   Lab Results  Component Value Date   WBC 6.3 08/08/2021   HGB 12.9 (L) 08/08/2021   HCT 38.9 08/08/2021   MCV 89 08/08/2021   PLT 164 08/08/2021   Lab Results  Component Value Date   NA 136 11/08/2021   K 3.5 11/08/2021   CO2 31 11/08/2021   GLUCOSE 90 11/08/2021   BUN 8 11/08/2021   CREATININE 0.72 11/08/2021   BILITOT 0.4 01/06/2021   ALKPHOS 112 01/06/2021   AST 19 01/06/2021   ALT 13 01/06/2021   PROT 6.3 01/06/2021   ALBUMIN 4.4 01/06/2021   CALCIUM 9.4 11/08/2021   ANIONGAP 5 09/20/2019   EGFR 86 08/08/2021   GFR 80.52 11/08/2021   Lab Results  Component Value Date   CHOL 175 02/22/2021   Lab Results  Component Value Date   HDL 54 02/22/2021   Lab Results  Component Value Date   LDLCALC 107 (H) 02/22/2021   Lab Results  Component Value Date   TRIG 74 02/22/2021   Lab Results  Component Value Date   CHOLHDL 3.2 02/22/2021  Lab Results  Component Value Date   HGBA1C 5.5 02/05/2019       Assessment & Plan:   Talon was seen today for neck pain.  Diagnoses and all orders for this visit:  Acute neck pain Consistent with strain. Continue tylenol. Low dose prednisone burst as below. Lidocaine ointment prn. Handout given. Return to office for new or worsening symptoms, or if symptoms persist.  -     predniSONE (DELTASONE) 20 MG tablet; Take 1 tablet (20 mg total) by mouth daily with breakfast for 3 days. -     lidocaine (XYLOCAINE) 5 % ointment; Apply 1 application topically as needed.  The patient indicates understanding of these issues and agrees with the plan.  Gwenlyn Perking, FNP

## 2021-11-23 NOTE — Patient Instructions (Signed)
Cervical Radiculopathy Cervical radiculopathy happens when a nerve in the neck (a cervical nerve) is pinched or bruised. This condition can happen because of an injury to the cervical spine (vertebrae) in the neck, or as part of the normal aging process. Pressure on the cervical nerves can cause pain or numbness that travels from the neck all the way down to the arm and fingers. This condition usually gets better with rest. Treatment may be needed if the condition does not improve. What are the causes? This condition may be caused by: A neck injury. A bulging (herniated) disk. Muscle spasms. Muscle tightness in the neck due to overuse. Arthritis. Breakdown or degeneration in the bones and joints of the spine (spondylosis) due to aging. Bone spurs that may develop near the cervical nerves. What are the signs or symptoms? Symptoms of this condition include: Pain. The pain may travel from the neck to the arm and hand. The pain can be severe or irritating. It may get worse when you move your neck. Numbness or tingling in your arm or hand. Weakness in the affected arm and hand, in severe cases. How is this diagnosed? This condition may be diagnosed based on your symptoms, your medical history, and a physical exam. You may also have tests, including: X-rays. CT scan. MRI. Electromyogram (EMG). Nerve conduction tests. How is this treated? In many cases, treatment is not needed for this condition. With rest, the condition usually gets better over time. If treatment is needed, options may include: Wearing a soft neck collar (cervical collar) for short periods of time. Doing physical therapy to strengthen your neck muscles. Taking medicines. These may include NSAIDs, such as ibuprofen, or oral corticosteroids. Having spinal injections, in severe cases. Having surgery. This may be needed if other treatments do not help. Different types of surgery may be done depending on the cause of this  condition. Follow these instructions at home: If you have a cervical collar: Wear it as told by your health care provider. Remove it only as told by your health care provider. Ask your health care provider if you can remove the cervical collar for cleaning and bathing. If you are allowed to remove the collar for cleaning or bathing: Follow instructions from your health care provider about how to remove the collar safely. Clean the collar by wiping it with mild soap and water and drying it completely. Take out any removable pads in the collar every 1-2 days, and wash them by hand with soap and water. Let them air-dry completely before you put them back in the collar. Check your skin under the collar for irritation or sores. If you see any, tell your health care provider. Managing pain   Take over-the-counter and prescription medicines only as told by your health care provider. If directed, put ice on the affected area. To do this: If you have a soft neck collar, remove it as told by your health care provider. Put ice in a plastic bag. Place a towel between your skin and the bag. Leave the ice on for 20 minutes, 2-3 times a day. Remove the ice if your skin turns bright red. This is very important. If you cannot feel pain, heat, or cold, you have a greater risk of damage to the area. If applying ice does not help, you can try using heat. Use the heat source that your health care provider recommends, such as a moist heat pack or a heating pad. Place a towel between your skin and  the heat source. Leave the heat on for 20-30 minutes. Remove the heat if your skin turns bright red. This is especially important if you are unable to feel pain, heat, or cold. You have a greater risk of getting burned. Try a gentle neck and shoulder massage to help relieve symptoms. Activity Rest as needed. Return to your normal activities as told by your health care provider. Ask your health care provider what  activities are safe for you. Do stretching and strengthening exercises as told by your health care provider or your physical therapist. You may have to avoid lifting. Ask your health care provider how much you can safely lift. General instructions Use a flat pillow when you sleep. Do not drive while wearing a cervical collar. If you do not have a cervical collar, ask your health care provider if it is safe to drive while your neck heals. Ask your health care provider if the medicine prescribed to you requires you to avoid driving or using machinery. Do not use any products that contain nicotine or tobacco. These products include cigarettes, chewing tobacco, and vaping devices, such as e-cigarettes. If you need help quitting, ask your health care provider. Keep all follow-up visits. This is important. Contact a health care provider if: Your condition does not improve with treatment. Get help right away if: Your pain gets much worse and is not controlled with medicines. You have weakness or numbness in your hand, arm, face, or leg. You have a high fever. You have a stiff, rigid neck. You lose control of your bowels or your bladder (have incontinence). You have trouble with walking, balance, or speaking. Summary Cervical radiculopathy happens when a nerve in the neck is pinched or bruised. A nerve can get pinched from a bulging disk, arthritis, muscle spasms, or an injury to the neck. Symptoms include pain, tingling, or numbness radiating from the neck to the arm or hand. Weakness can also occur in severe cases. Treatment may include rest, wearing a cervical collar, and physical therapy. Medicines may be prescribed to help with pain. In severe cases, injections or surgery may be needed. This information is not intended to replace advice given to you by your health care provider. Make sure you discuss any questions you have with your health care provider. Document Revised: 06/08/2021 Document  Reviewed: 06/08/2021 Elsevier Patient Education  [place].

## 2021-11-29 ENCOUNTER — Ambulatory Visit: Payer: Medicare Other | Admitting: Family Medicine

## 2021-12-05 DIAGNOSIS — Z87891 Personal history of nicotine dependence: Secondary | ICD-10-CM | POA: Diagnosis not present

## 2021-12-05 DIAGNOSIS — Z79890 Hormone replacement therapy (postmenopausal): Secondary | ICD-10-CM | POA: Diagnosis not present

## 2021-12-05 DIAGNOSIS — F32A Depression, unspecified: Secondary | ICD-10-CM | POA: Diagnosis not present

## 2021-12-05 DIAGNOSIS — I2510 Atherosclerotic heart disease of native coronary artery without angina pectoris: Secondary | ICD-10-CM | POA: Diagnosis not present

## 2021-12-05 DIAGNOSIS — I4891 Unspecified atrial fibrillation: Secondary | ICD-10-CM | POA: Diagnosis not present

## 2021-12-05 DIAGNOSIS — K219 Gastro-esophageal reflux disease without esophagitis: Secondary | ICD-10-CM | POA: Diagnosis not present

## 2021-12-05 DIAGNOSIS — E785 Hyperlipidemia, unspecified: Secondary | ICD-10-CM | POA: Diagnosis not present

## 2021-12-05 DIAGNOSIS — J0110 Acute frontal sinusitis, unspecified: Secondary | ICD-10-CM | POA: Diagnosis not present

## 2021-12-05 DIAGNOSIS — Z79899 Other long term (current) drug therapy: Secondary | ICD-10-CM | POA: Diagnosis not present

## 2021-12-05 DIAGNOSIS — I517 Cardiomegaly: Secondary | ICD-10-CM | POA: Diagnosis not present

## 2021-12-05 DIAGNOSIS — Z7951 Long term (current) use of inhaled steroids: Secondary | ICD-10-CM | POA: Diagnosis not present

## 2021-12-05 DIAGNOSIS — Z20822 Contact with and (suspected) exposure to covid-19: Secondary | ICD-10-CM | POA: Diagnosis not present

## 2021-12-05 DIAGNOSIS — J811 Chronic pulmonary edema: Secondary | ICD-10-CM | POA: Diagnosis not present

## 2021-12-05 DIAGNOSIS — I10 Essential (primary) hypertension: Secondary | ICD-10-CM | POA: Diagnosis not present

## 2021-12-05 DIAGNOSIS — E039 Hypothyroidism, unspecified: Secondary | ICD-10-CM | POA: Diagnosis not present

## 2021-12-05 DIAGNOSIS — Z951 Presence of aortocoronary bypass graft: Secondary | ICD-10-CM | POA: Diagnosis not present

## 2021-12-05 DIAGNOSIS — R053 Chronic cough: Secondary | ICD-10-CM | POA: Diagnosis not present

## 2021-12-08 DIAGNOSIS — I2510 Atherosclerotic heart disease of native coronary artery without angina pectoris: Secondary | ICD-10-CM | POA: Diagnosis not present

## 2021-12-08 DIAGNOSIS — R0602 Shortness of breath: Secondary | ICD-10-CM | POA: Diagnosis not present

## 2021-12-08 DIAGNOSIS — Z950 Presence of cardiac pacemaker: Secondary | ICD-10-CM | POA: Diagnosis not present

## 2021-12-08 DIAGNOSIS — G8929 Other chronic pain: Secondary | ICD-10-CM | POA: Diagnosis not present

## 2021-12-08 DIAGNOSIS — I10 Essential (primary) hypertension: Secondary | ICD-10-CM | POA: Diagnosis not present

## 2021-12-08 DIAGNOSIS — I482 Chronic atrial fibrillation, unspecified: Secondary | ICD-10-CM | POA: Diagnosis not present

## 2021-12-08 DIAGNOSIS — J209 Acute bronchitis, unspecified: Secondary | ICD-10-CM | POA: Diagnosis not present

## 2021-12-08 DIAGNOSIS — Z87891 Personal history of nicotine dependence: Secondary | ICD-10-CM | POA: Diagnosis not present

## 2021-12-08 DIAGNOSIS — J441 Chronic obstructive pulmonary disease with (acute) exacerbation: Secondary | ICD-10-CM | POA: Diagnosis not present

## 2021-12-08 DIAGNOSIS — I517 Cardiomegaly: Secondary | ICD-10-CM | POA: Diagnosis not present

## 2021-12-08 DIAGNOSIS — E785 Hyperlipidemia, unspecified: Secondary | ICD-10-CM | POA: Diagnosis not present

## 2021-12-08 DIAGNOSIS — Z20822 Contact with and (suspected) exposure to covid-19: Secondary | ICD-10-CM | POA: Diagnosis not present

## 2021-12-12 ENCOUNTER — Ambulatory Visit (INDEPENDENT_AMBULATORY_CARE_PROVIDER_SITE_OTHER): Payer: Medicare Other | Admitting: Family Medicine

## 2021-12-12 ENCOUNTER — Encounter: Payer: Self-pay | Admitting: Family Medicine

## 2021-12-12 VITALS — BP 144/75 | HR 87 | Temp 98.1°F | Ht 72.0 in | Wt 173.6 lb

## 2021-12-12 DIAGNOSIS — I482 Chronic atrial fibrillation, unspecified: Secondary | ICD-10-CM

## 2021-12-12 DIAGNOSIS — B9689 Other specified bacterial agents as the cause of diseases classified elsewhere: Secondary | ICD-10-CM | POA: Diagnosis not present

## 2021-12-12 DIAGNOSIS — Z7901 Long term (current) use of anticoagulants: Secondary | ICD-10-CM | POA: Diagnosis not present

## 2021-12-12 DIAGNOSIS — J988 Other specified respiratory disorders: Secondary | ICD-10-CM | POA: Diagnosis not present

## 2021-12-12 MED ORDER — DOXYCYCLINE HYCLATE 100 MG PO TABS: 100.0000 mg | ORAL_TABLET | Freq: Two times a day (BID) | ORAL | 0 refills | Status: AC

## 2021-12-12 NOTE — Progress Notes (Signed)
Assessment & Plan:  1. Bacterial respiratory infection Treating due to worsening symptoms after a week. Continue symptom management as well. - albuterol (VENTOLIN HFA) 108 (90 Base) MCG/ACT inhaler; Inhale 2 puffs into the lungs every 4 (four) hours as needed. - doxycycline (VIBRA-TABS) 100 MG tablet; Take 1 tablet (100 mg total) by mouth 2 (two) times daily for 7 days.  Dispense: 14 tablet; Refill: 0  2-3. Chronic atrial fibrillation (HCC)/Chronic anticoagulation Description   INR 1.1 with goal INR 2-2.5. Patient to resume warfarin as he has been off x4 days. Re-check INR next week.   - CoaguChek XS/INR Waived   Follow up plan: Return in about 8 days (around 12/20/2021) for INR with PCP.  Hendricks Limes, MSN, APRN, FNP-C [facility]  Subjective:   Patient ID: Dalton Vaughan, male    DOB: [birth year], 87 y.o.   MRN: [id number]  HPI: Dalton Vaughan is a 87 y.o. male presenting on 12/12/2021 for [facility] urgent care (12/23- bronchitis. Patient states that cough is no better and has some blood in it)  Patient has been seen twice at [facility] in the past week. The first time he was seen on 12/05/21 he was diagnosed with sinusitis and prescribed clindamycin and prednisone. He returned on 12/08/21 reporting he was getting worse. He was told at that time he had bronchitis and was prescribed more steroids. COVID, flu and strep were negative at his first visit. He did have a respiratory panel on his second visit that was positive for RSV. Today he reports he has continued to worsened over the past week. He complains of worsening cough, chest congestion, runny nose, sore throat, facial pain/pressure, postnasal drainage, shortness of breath, and wheezing.  He is drinking plenty of fluids. Treatment to date: antibiotics, antihistamines, nasal steroids, Albuterol, and steroids, however he only took the antibiotic for a couple of days. He does have a history of A-Fib for which he  takes warfarin, which he has been off of for four days as he reports he saw blood in his sputum.    ROS: Negative unless specifically indicated above in HPI.   Relevant past medical history reviewed and updated as indicated.   Allergies and medications reviewed and updated.   Current Outpatient Medications:    albuterol (VENTOLIN HFA) 108 (90 Base) MCG/ACT inhaler, Inhale 2 puffs into the lungs every 4 (four) hours as needed., Disp: , Rfl:    Calcium Carb-Cholecalciferol (CALCIUM + D3) 600-200 MG-UNIT TABS, Take 1 tablet by mouth daily. , Disp: , Rfl:    Cholecalciferol (VITAMIN D-3) 5000 UNITS TABS, Take 1 tablet by mouth daily., Disp: , Rfl:    doxazosin (CARDURA) 8 MG tablet, TAKE 1 TABLET AT BEDTIME, Disp: 90 tablet, Rfl: 3   lidocaine (XYLOCAINE) 5 % ointment, Apply 1 application topically as needed., Disp: 35.44 g, Rfl: 0   mirtazapine (REMERON) 15 MG tablet, TAKE 1 TABLET AT BEDTIME, Disp: 90 tablet, Rfl: 3   Multiple Vitamin (MULTIVITAMIN) tablet, Take 1 tablet by mouth daily., Disp: , Rfl:    ramipril (ALTACE) 10 MG capsule, Take 1 capsule (10 mg total) by mouth daily., Disp: 90 capsule, Rfl: 3   SIMETHICONE PO, Take by mouth as needed., Disp: , Rfl:    SYNTHROID 50 MCG tablet, TAKE 1 TABLET DAILY BEFORE BREAKFAST, Disp: 90 tablet, Rfl: 3   triamterene-hydrochlorothiazide (MAXZIDE-25) 37.5-25 MG tablet, TAKE 1 TABLET DAILY, Disp: 90 tablet, Rfl: 0   vitamin C (ASCORBIC ACID) 500 MG  tablet, Take 500 mg by mouth daily. , Disp: , Rfl:    XIIDRA 5 % SOLN, Apply 1 drop to eye as needed. , Disp: , Rfl:    amLODipine (NORVASC) 5 MG tablet, Take 1 tablet (5 mg total) by mouth daily., Disp: 90 tablet, Rfl: 1   warfarin (COUMADIN) 5 MG tablet, Take 1 tablet (57m) by mouth daily as directed (Patient not taking: Reported on 12/12/2021), Disp: 90 tablet, Rfl: 1  Allergies  Allergen Reactions   Paxil [Paroxetine Hcl] Palpitations   Codeine Other (See Comments)   Eliquis [Apixaban] Other  (See Comments)    dizziness   Penicillins Other (See Comments)    Did it involve swelling of the face/tongue/throat, SOB, or low BP? Unknown Did it involve sudden or severe rash/hives, skin peeling, or any reaction on the inside of your mouth or nose? Unknown Did you need to seek medical attention at a hospital or doctor's office? Unknown When did it last happen?      unk If all above answers are NO, may proceed with cephalosporin use.    Pravachol [Pravastatin Sodium] Other (See Comments)    myalgias   Zetia [Ezetimibe] Other (See Comments)    myalgias   Vytorin [Ezetimibe-Simvastatin] Other (See Comments)    myalgias    Objective:   BP (!) 144/75    Pulse 87    Temp 98.1 F (36.7 C) (Temporal)    Ht 6' (1.829 m)    Wt 173 lb 9.6 oz (78.7 kg)    SpO2 96%    BMI 23.54 kg/m    Physical Exam Vitals reviewed.  Constitutional:      General: He is not in acute distress.    Appearance: Normal appearance. He is normal weight. He is not ill-appearing, toxic-appearing or diaphoretic.  HENT:     Head: Normocephalic and atraumatic.     Right Ear: Tympanic membrane, ear canal and external ear normal. There is no impacted cerumen.     Left Ear: Tympanic membrane, ear canal and external ear normal. There is no impacted cerumen.     Nose: Congestion present.     Mouth/Throat:     Mouth: Mucous membranes are moist.     Pharynx: Posterior oropharyngeal erythema present. No oropharyngeal exudate.  Eyes:     General: No scleral icterus.       Right eye: No discharge.        Left eye: No discharge.     Conjunctiva/sclera: Conjunctivae normal.  Cardiovascular:     Rate and Rhythm: Normal rate and regular rhythm.     Heart sounds: Normal heart sounds. No murmur heard.   No friction rub. No gallop.  Pulmonary:     Effort: Pulmonary effort is normal. No respiratory distress.     Breath sounds: No stridor. Rhonchi present. No wheezing or rales.  Musculoskeletal:        General: Normal range  of motion.     Cervical back: Normal range of motion.  Lymphadenopathy:     Cervical: No cervical adenopathy.  Skin:    General: Skin is warm and dry.  Neurological:     Mental Status: He is alert and oriented to person, place, and time. Mental status is at baseline.  Psychiatric:        Mood and Affect: Mood normal.        Behavior: Behavior normal.        Thought Content: Thought content normal.  Judgment: Judgment normal.

## 2021-12-13 LAB — COAGUCHEK XS/INR WAIVED
INR: 1.1 (ref 0.9–1.1)
Prothrombin Time: 13.4 s

## 2021-12-18 ENCOUNTER — Encounter: Payer: Self-pay | Admitting: Family Medicine

## 2021-12-20 ENCOUNTER — Ambulatory Visit (INDEPENDENT_AMBULATORY_CARE_PROVIDER_SITE_OTHER): Payer: Medicare Other | Admitting: Family Medicine

## 2021-12-20 ENCOUNTER — Encounter: Payer: Self-pay | Admitting: Family Medicine

## 2021-12-20 VITALS — BP 145/87 | HR 87 | Temp 98.2°F | Ht 72.0 in | Wt 166.2 lb

## 2021-12-20 DIAGNOSIS — I482 Chronic atrial fibrillation, unspecified: Secondary | ICD-10-CM

## 2021-12-20 DIAGNOSIS — I700 Atherosclerosis of aorta: Secondary | ICD-10-CM

## 2021-12-20 DIAGNOSIS — J310 Chronic rhinitis: Secondary | ICD-10-CM | POA: Diagnosis not present

## 2021-12-20 DIAGNOSIS — E079 Disorder of thyroid, unspecified: Secondary | ICD-10-CM | POA: Diagnosis not present

## 2021-12-20 DIAGNOSIS — Z7901 Long term (current) use of anticoagulants: Secondary | ICD-10-CM

## 2021-12-20 LAB — CMP14+EGFR
ALT: 25 IU/L (ref 0–44)
AST: 30 IU/L (ref 0–40)
Albumin/Globulin Ratio: 2 (ref 1.2–2.2)
Albumin: 4.3 g/dL (ref 3.5–4.6)
Alkaline Phosphatase: 137 IU/L — ABNORMAL HIGH (ref 44–121)
BUN/Creatinine Ratio: 17 (ref 10–24)
BUN: 13 mg/dL (ref 10–36)
Bilirubin Total: 0.7 mg/dL (ref 0.0–1.2)
CO2: 28 mmol/L (ref 20–29)
Calcium: 9.5 mg/dL (ref 8.6–10.2)
Chloride: 100 mmol/L (ref 96–106)
Creatinine, Ser: 0.75 mg/dL — ABNORMAL LOW (ref 0.76–1.27)
Globulin, Total: 2.1 g/dL (ref 1.5–4.5)
Glucose: 86 mg/dL (ref 70–99)
Potassium: 3.5 mmol/L (ref 3.5–5.2)
Sodium: 139 mmol/L (ref 134–144)
Total Protein: 6.4 g/dL (ref 6.0–8.5)
eGFR: 86 mL/min/{1.73_m2} (ref >59)

## 2021-12-20 LAB — LIPID PANEL
Chol/HDL Ratio: 3.5 ratio (ref 0.0–5.0)
Cholesterol, Total: 184 mg/dL (ref 100–199)
HDL: 53 mg/dL (ref >39)
LDL Chol Calc (NIH): 117 mg/dL — ABNORMAL HIGH (ref 0–99)
Triglycerides: 78 mg/dL (ref 0–149)
VLDL Cholesterol Cal: 14 mg/dL (ref 5–40)

## 2021-12-20 LAB — COAGUCHEK XS/INR WAIVED
INR: 2.9 — ABNORMAL HIGH (ref 0.9–1.1)
Prothrombin Time: 35.2 s

## 2021-12-20 LAB — TSH: TSH: 3.37 u[IU]/mL (ref 0.450–4.500)

## 2021-12-20 LAB — T4, FREE: Free T4: 1.49 ng/dL (ref 0.82–1.77)

## 2021-12-20 MED ORDER — CETIRIZINE HCL 10 MG PO TABS: 5.0000 mg | ORAL_TABLET | Freq: Every evening | ORAL | 3 refills | Status: DC | PRN

## 2021-12-20 NOTE — Patient Instructions (Signed)
Take 1/2 tablet of the allergy pill at bedtime.  This should dry up your drainage

## 2021-12-20 NOTE — Progress Notes (Signed)
Subjective: CC:INR PCP: [name], DO HPI:Dalton Vaughan is a 88 y.o. male presenting to clinic today for:  1. Afib INR Goal 2-3 Compliant with Coumadin.  No reports of bleeding.  2. Aortic atherosclerosis Compliant with all blood pressure regimen.  Not currently treated with any cholesterol-lowering medications.  No chest pain, shortness of breath reported.  3. Hypothyroidism Compliant with Synthroid.  No reports of tremor, heart palpitations or change in bowel habits  4.  Bronchitis He reports his recurrent issue for him.  He recently was treated with antibiotics.  He wonders how he might prevent this from happening again.  He is a former smoker but he quit a couple of decades ago.  No hemoptysis.  No shortness of breath.  He does report drainage at night.  Not currently on any antihistamines.   ROS: Per HPI  Allergies  Allergen Reactions   Paxil [Paroxetine Hcl] Palpitations   Codeine Other (See Comments)   Eliquis [Apixaban] Other (See Comments)    dizziness   Penicillins Other (See Comments)    Did it involve swelling of the face/tongue/throat, SOB, or low BP? Unknown Did it involve sudden or severe rash/hives, skin peeling, or any reaction on the inside of your mouth or nose? Unknown Did you need to seek medical attention at a hospital or doctor's office? Unknown When did it last happen?      unk If all above answers are NO, may proceed with cephalosporin use.    Pravachol [Pravastatin Sodium] Other (See Comments)    myalgias   Zetia [Ezetimibe] Other (See Comments)    myalgias   Vytorin [Ezetimibe-Simvastatin] Other (See Comments)    myalgias   Past Medical History:  Diagnosis Date   Anal fissure    Arthritis    Atrial fibrillation (HCC)    BPH (benign prostatic hypertrophy)    CAD (coronary artery disease)    Dr. Wynonia Lawman   Cataract    COVID-19    Depression 2004   Diverticulosis    GERD (gastroesophageal reflux disease)    History of TIAs     Hyperlipidemia    Hypertension    Hypertensive heart disease without CHF    Hypothyroidism    Internal hemorrhoids    Lumbar disc disease    Oral bleeding 08/20/2012   Following molar tooth extraction July, 2013   Pneumonia    Ruptured disk 1995   Shingles    Thyroid disease    Vitamin D deficiency    Vitreous detachment     Current Outpatient Medications:    albuterol (VENTOLIN HFA) 108 (90 Base) MCG/ACT inhaler, Inhale 2 puffs into the lungs every 4 (four) hours as needed., Disp: , Rfl:    amLODipine (NORVASC) 5 MG tablet, Take 1 tablet (5 mg total) by mouth daily., Disp: 90 tablet, Rfl: 1   Calcium Carb-Cholecalciferol (CALCIUM + D3) 600-200 MG-UNIT TABS, Take 1 tablet by mouth daily. , Disp: , Rfl:    Cholecalciferol (VITAMIN D-3) 5000 UNITS TABS, Take 1 tablet by mouth daily., Disp: , Rfl:    doxazosin (CARDURA) 8 MG tablet, TAKE 1 TABLET AT BEDTIME, Disp: 90 tablet, Rfl: 3   lidocaine (XYLOCAINE) 5 % ointment, Apply 1 application topically as needed., Disp: 35.44 g, Rfl: 0   mirtazapine (REMERON) 15 MG tablet, TAKE 1 TABLET AT BEDTIME, Disp: 90 tablet, Rfl: 3   Multiple Vitamin (MULTIVITAMIN) tablet, Take 1 tablet by mouth daily., Disp: , Rfl:    ramipril (ALTACE) 10 MG capsule,  Take 1 capsule (10 mg total) by mouth daily., Disp: 90 capsule, Rfl: 3   SIMETHICONE PO, Take by mouth as needed., Disp: , Rfl:    SYNTHROID 50 MCG tablet, TAKE 1 TABLET DAILY BEFORE BREAKFAST, Disp: 90 tablet, Rfl: 3   triamterene-hydrochlorothiazide (MAXZIDE-25) 37.5-25 MG tablet, TAKE 1 TABLET DAILY, Disp: 90 tablet, Rfl: 0   vitamin C (ASCORBIC ACID) 500 MG tablet, Take 500 mg by mouth daily. , Disp: , Rfl:    warfarin (COUMADIN) 5 MG tablet, Take 1 tablet (67m) by mouth daily as directed (Patient not taking: Reported on 12/12/2021), Disp: 90 tablet, Rfl: 1   XIIDRA 5 % SOLN, Apply 1 drop to eye as needed. 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   Occupation: Retired    EFish farm manager PMarine scientist   Comment: supervisor x 17 years  Tobacco Use   Smoking status: Former    Packs/day: 1.00    Years: 18.00    Pack years: 18.00    Types: Cigarettes    Start date: 12/17/1948    Quit date: 12/17/1966    Years since quitting: 55.0   Smokeless tobacco: Never  Vaping Use   Vaping Use: Never used  Substance and Sexual Activity   Alcohol use: Yes    Alcohol/week: 1.0 standard drink    Types: 1 Standard drinks or equivalent per week    Comment: rare   Drug use: No   Sexual activity: Not on file  Other Topics Concern   Not on file  Social History Narrative   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, musicat [employer]     Right-handed      Caffeine use: none      Son and grandson live with him   Social Determinants of HRadio broadcast assistantStrain: Low Risk    Difficulty of Paying Living Expenses: Not hard at all  Food Insecurity: No Food Insecurity   Worried About RCharity fundraiserin the Last Year: Never true   RArboriculturistin the Last Year: Never true  Transportation Needs: No Transportation Needs   Lack of Transportation (Medical): No   Lack of Transportation (Non-Medical): No  Physical Activity: Sufficiently Active   Days of Exercise per Week: 5 days   Minutes of Exercise per Session: 50 min  Stress: No Stress Concern Present   Feeling of Stress : Only a little  Social Connections: Moderately Isolated   Frequency of Communication with Friends and Family: More than three times a week   Frequency of Social Gatherings with Friends and Family: More than three times a week   Attends Religious Services: Never   AMarine scientistor Organizations: Yes   Attends CArchivistMeetings: 1 to 4 times per year    Marital Status: Widowed  IHuman resources officerViolence: Not At Risk   Fear of Current or Ex-Partner: No   Emotionally Abused: No   Physically Abused: No   Sexually Abused: No   Family History  Problem Relation Age of Onset   Dementia Mother    Arthritis Mother    Cancer Brother  prostate   Asthma Sister    Heart disease Paternal Aunt    Heart disease Paternal Uncle    Hypertension Maternal Grandmother    CVA Maternal Grandmother    CVA Paternal Grandmother    Hepatitis C Son    Arthritis Son    Colon cancer Neg Hx    Colon polyps Neg Hx    Kidney disease Neg Hx    Esophageal cancer Neg Hx    Gallbladder disease Neg Hx    Diabetes Neg Hx     Objective: Office vital signs reviewed. BP (!) 145/87    Pulse 87    Temp 98.2 F (36.8 C)    Ht 6' (1.829 m)    Wt 166 lb 3.2 oz (75.4 kg)    SpO2 96%    BMI 22.54 kg/m   Physical Examination:  General: Awake, alert, nontoxic elderly male, No acute distress HEENT: Sclera white.  No goiter.  No exophthalmos Cardio: Irregularly irregular with rate control, S1S2 heard, no murmurs appreciated Pulm: clear to auscultation bilaterally, no wheezes, rhonchi or rales; normal work of breathing on room air MSK: Slow gait and normal station Neuro: No tremor Assessment/ Plan: 88 y.o. male   Chronic atrial fibrillation ([place]) - Plan: CoaguChek XS/INR Waived  Chronic anticoagulation - Plan: CoaguChek XS/INR Waived  Aortic atherosclerosis (HCC) - Plan: Lipid Panel, CMP14+EGFR  Thyroid disease - Plan: TSH, T4, Free  Chronic rhinitis - Plan: cetirizine (ZYRTEC ALLERGY) 10 MG tablet  INR therapeutic.  No changes.  May follow-up in 6 weeks  Check lipid, CMP.  Not currently on any cholesterol-lowering medicines  Check TSH, free T4.  Asymptomatic  5 mg of Zyrtec nightly as needed allergy symptoms.  Discussed consideration for chronic inhaler but he would like to hold off on until next visit  No orders of the defined types were placed in  this encounter.  No orders of the defined types were placed in this encounter.    [name], DO [facility] [phone]

## 2021-12-26 ENCOUNTER — Ambulatory Visit: Payer: Medicare Other | Admitting: Family Medicine

## 2022-01-11 DIAGNOSIS — B351 Tinea unguium: Secondary | ICD-10-CM | POA: Diagnosis not present

## 2022-01-11 DIAGNOSIS — M79676 Pain in unspecified toe(s): Secondary | ICD-10-CM | POA: Diagnosis not present

## 2022-01-11 DIAGNOSIS — I70203 Unspecified atherosclerosis of native arteries of extremities, bilateral legs: Secondary | ICD-10-CM | POA: Diagnosis not present

## 2022-01-11 DIAGNOSIS — L84 Corns and callosities: Secondary | ICD-10-CM | POA: Diagnosis not present

## 2022-01-19 LAB — POCT INR: INR: 2.3 (ref 2.0–3.0)

## 2022-01-20 DIAGNOSIS — I495 Sick sinus syndrome: Secondary | ICD-10-CM | POA: Diagnosis not present

## 2022-02-02 ENCOUNTER — Ambulatory Visit: Payer: Medicare Other | Admitting: Family Medicine

## 2022-02-06 ENCOUNTER — Encounter: Payer: Self-pay | Admitting: Family Medicine

## 2022-02-06 ENCOUNTER — Other Ambulatory Visit: Payer: Self-pay | Admitting: Family Medicine

## 2022-02-06 DIAGNOSIS — I10 Essential (primary) hypertension: Secondary | ICD-10-CM

## 2022-02-07 ENCOUNTER — Encounter: Payer: Self-pay | Admitting: Family Medicine

## 2022-02-07 ENCOUNTER — Ambulatory Visit (INDEPENDENT_AMBULATORY_CARE_PROVIDER_SITE_OTHER): Payer: Medicare Other | Admitting: Family Medicine

## 2022-02-07 VITALS — BP 149/85 | HR 80 | Temp 97.8°F | Ht 72.0 in | Wt 172.0 lb

## 2022-02-07 DIAGNOSIS — L989 Disorder of the skin and subcutaneous tissue, unspecified: Secondary | ICD-10-CM

## 2022-02-07 DIAGNOSIS — Z7901 Long term (current) use of anticoagulants: Secondary | ICD-10-CM | POA: Diagnosis not present

## 2022-02-07 DIAGNOSIS — I482 Chronic atrial fibrillation, unspecified: Secondary | ICD-10-CM | POA: Diagnosis not present

## 2022-02-07 LAB — COAGUCHEK XS/INR WAIVED
INR: 2.3 — ABNORMAL HIGH (ref 0.9–1.1)
Prothrombin Time: 27.8 s

## 2022-02-07 NOTE — Progress Notes (Signed)
Subjective:  Patient ID: [name], male    DOB: 1928-12-20, 88 y.o.   MRN: [id number]  Patient Care Team: [name], DO as PCP - General (Family Medicine) Raynelle Bring, MD as Consulting Physician (Urology) Pyrtle, Lajuan Lines, MD as Consulting Physician (Gastroenterology) Clent Jacks, MD as Consulting Physician (Ophthalmology) Loni Beckwith Neita Goodnight., MD as Referring Physician (Cardiology)   Chief Complaint:  Anticoagulation   HPI: Shawntez Dickison is a 88 y.o. male presenting on 02/07/2022 for Anticoagulation   Pt presents for INR check. No changes to recent activity, alcohol intake, or medications.  Denies chest pain, shortness of breath, palpitations, dizziness, or syncope. No abnormal bleeding or bruising.  He does have a lesion to his face he would like to have looked at. Has been present for over a month. Pruritic at times.   Relevant past medical, surgical, family, and social history reviewed and updated as indicated.  Allergies and medications reviewed and updated. Data reviewed: Chart in Epic.   Past Medical History:  Diagnosis Date   Anal fissure    Arthritis    Atrial fibrillation (HCC)    BPH (benign prostatic hypertrophy)    CAD (coronary artery disease)    Dr. Wynonia Lawman   Cataract    COVID-19    Depression 2004   Diverticulosis    GERD (gastroesophageal reflux disease)    History of TIAs    Hyperlipidemia    Hypertension    Hypertensive heart disease without CHF    Hypothyroidism    Internal hemorrhoids    Lumbar disc disease    Oral bleeding 08/20/2012   Following molar tooth extraction July, 2013   Pneumonia    Ruptured disk 1995   Shingles    Thyroid disease    Vitamin D deficiency    Vitreous detachment     Past Surgical History:  Procedure Laterality Date   bilateral cataract surgery  6/07   CARDIAC CATHETERIZATION     CORONARY ARTERY BYPASS GRAFT  12/99    Dr. Servando Snare    cysloscopy  8/08   [place]    right   herninated disc  [place]  11/03/2020   PROSTATE BIOPSY  8/08   TONSILLECTOMY AND ADENOIDECTOMY      Social History   Socioeconomic History   Marital status: Widowed    Spouse name: [name]    Number of children: 1   Years of education: 12   Highest education level: Not on file  Occupational History   Occupation: Retired    Hydrologist: Social research officer, government x 27 years   Occupation: Retired    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  Tobacco Use   Smoking status: Former    Packs/day: 1.00    Years: 18.00    Pack years: 18.00    Types: Cigarettes    Start date: 12/17/1948    Quit date: 12/17/1966    Years since quitting: 55.1   Smokeless tobacco: Never  Vaping Use   Vaping Use: Never used  Substance and Sexual Activity   Alcohol use: Yes    Alcohol/week: 1.0 standard drink    Types: 1 Standard drinks or equivalent per week    Comment: rare   Drug use: No   Sexual activity: Not on file  Other Topics Concern   Not on file  Social History Narrative   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 [employer]      Right-handed      Caffeine use: none      Son and grandson live with him   Social Determinants of Health   Financial Resource Strain: Low Risk    Difficulty of Paying Living Expenses: Not hard at all  Food Insecurity: No Food Insecurity   Worried About Charity fundraiser in the Last Year: Never true   Arboriculturist in the Last Year: Never true  Transportation Needs: No Transportation Needs   Lack of Transportation (Medical): No   Lack of Transportation (Non-Medical): No  Physical Activity: Sufficiently Active   Days of Exercise per Week: 5 days   Minutes of Exercise per Session: 50 min  Stress: No Stress Concern Present   Feeling of Stress : Only a little  Social Connections: Moderately Isolated   Frequency of Communication with Friends and Family: More than three times a week   Frequency of Social  Gatherings with Friends and Family: More than three times a week   Attends Religious Services: Never   Marine scientist or Organizations: Yes   Attends Archivist Meetings: 1 to 4 times per year   Marital Status: Widowed  Intimate Partner Violence: Not At Risk   Fear of Current or Ex-Partner: No   Emotionally Abused: No   Physically Abused: No   Sexually Abused: No    Outpatient Encounter Medications as of 02/07/2022  Medication Sig   albuterol (VENTOLIN HFA) 108 (90 Base) MCG/ACT inhaler Inhale 2 puffs into the lungs every 4 (four) hours as needed.   amLODipine (NORVASC) 5 MG tablet TAKE 1 TABLET DAILY   Calcium Carb-Cholecalciferol (CALCIUM + D3) 600-200 MG-UNIT TABS Take 1 tablet by mouth daily.    cetirizine (ZYRTEC ALLERGY) 10 MG tablet Take 0.5 tablets (5 mg total) by mouth at bedtime as needed for allergies (drainage).   Cholecalciferol (VITAMIN D-3) 5000 UNITS TABS Take 1 tablet by mouth daily.   doxazosin (CARDURA) 8 MG tablet TAKE 1 TABLET AT BEDTIME   lidocaine (XYLOCAINE) 5 % ointment Apply 1 application topically as needed.   mirtazapine (REMERON) 15 MG tablet TAKE 1 TABLET AT BEDTIME   Multiple Vitamin (MULTIVITAMIN) tablet Take 1 tablet by mouth daily.   ramipril (ALTACE) 10 MG capsule Take 1 capsule (10 mg total) by mouth daily.   SIMETHICONE PO Take by mouth as needed.   SYNTHROID 50 MCG tablet TAKE 1 TABLET DAILY BEFORE BREAKFAST   triamterene-hydrochlorothiazide (MAXZIDE-25) 37.5-25 MG tablet TAKE 1 TABLET DAILY   vitamin C (ASCORBIC ACID) 500 MG tablet Take 500 mg by mouth daily.    warfarin (COUMADIN) 5 MG tablet Take 1 tablet (38m) by mouth daily as directed   XIIDRA 5 % SOLN Apply 1 drop to eye as needed.    No facility-administered encounter medications on file as of 02/07/2022.    Allergies  Allergen Reactions   Paxil [Paroxetine Hcl] Palpitations   Codeine Other (See Comments)   Eliquis [Apixaban] Other (See Comments)    dizziness    Penicillins Other (See Comments)    Did it involve swelling of the face/tongue/throat, SOB, or low BP? Unknown Did it involve sudden or severe rash/hives, skin peeling, or any reaction on the inside of your mouth or nose? Unknown Did you need to seek medical attention at a hospital or doctor's office? Unknown When did it last happen?      unk If all above  answers are NO, may proceed with cephalosporin use.    Pravachol [Pravastatin Sodium] Other (See Comments)    myalgias   Zetia [Ezetimibe] Other (See Comments)    myalgias   Vytorin [Ezetimibe-Simvastatin] Other (See Comments)    myalgias    Review of Systems  Constitutional:  Negative for activity change, appetite change, chills, diaphoresis, fatigue, fever and unexpected weight change.  HENT: Negative.    Eyes: Negative.  Negative for photophobia and visual disturbance.  Respiratory:  Negative for cough, chest tightness and shortness of breath.   Cardiovascular:  Negative for chest pain, palpitations and leg swelling.  Gastrointestinal:  Negative for anal bleeding, blood in stool, constipation, diarrhea, nausea and vomiting.  Endocrine: Negative.   Genitourinary:  Negative for difficulty urinating, dysuria, frequency, hematuria and urgency.  Musculoskeletal:  Negative for arthralgias and myalgias.  Skin:  Positive for color change. Negative for pallor, rash and wound.  Allergic/Immunologic: Negative.   Neurological:  Negative for dizziness, tremors, seizures, syncope, facial asymmetry, speech difficulty, weakness, light-headedness, numbness and headaches.  Hematological: Negative.  Does not bruise/bleed easily.  Psychiatric/Behavioral:  Negative for confusion, hallucinations, sleep disturbance and suicidal ideas.   All other systems reviewed and are negative.      Objective:  BP (!) 149/85    Pulse 80    Temp 97.8 F (36.6 C) (Temporal)    Ht 6' (1.829 m)    Wt 78 kg    SpO2 97%    BMI 23.33 kg/m    Wt Readings from Last 3  Encounters:  02/07/22 78 kg  12/20/21 75.4 kg  12/12/21 78.7 kg    Physical Exam Vitals and nursing note reviewed.  Constitutional:      General: He is not in acute distress.    Appearance: Normal appearance. He is well-developed, well-groomed and normal weight. He is not ill-appearing, toxic-appearing or diaphoretic.  HENT:     Head: Normocephalic and atraumatic.     Jaw: There is normal jaw occlusion.     Right Ear: Hearing normal.     Left Ear: Hearing normal.     Nose: Nose normal.     Mouth/Throat:     Lips: Pink.     Mouth: Mucous membranes are moist.     Pharynx: Oropharynx is clear. Uvula midline.  Eyes:     General: Lids are normal.     Extraocular Movements: Extraocular movements intact.     Conjunctiva/sclera: Conjunctivae normal.     Pupils: Pupils are equal, round, and reactive to light.  Neck:     Thyroid: No thyroid mass, thyromegaly or thyroid tenderness.     Vascular: No carotid bruit or JVD.     Trachea: Trachea and phonation normal.  Cardiovascular:     Rate and Rhythm: Normal rate. Rhythm irregularly irregular.     Chest Wall: PMI is not displaced.     Pulses: Normal pulses.     Heart sounds: Normal heart sounds. No murmur heard.   No friction rub. No gallop.  Pulmonary:     Effort: Pulmonary effort is normal. No respiratory distress.     Breath sounds: Normal breath sounds. No wheezing.  Abdominal:     General: Bowel sounds are normal. There is no distension or abdominal bruit.     Palpations: Abdomen is soft. There is no hepatomegaly or splenomegaly.     Tenderness: There is no abdominal tenderness. There is no right CVA tenderness or left CVA tenderness.     Hernia: No  hernia is present.  Musculoskeletal:        General: Normal range of motion.     Cervical back: Normal range of motion and neck supple.     Right lower leg: No edema.     Left lower leg: No edema.  Lymphadenopathy:     Cervical: No cervical adenopathy.  Skin:    General: Skin  is warm and dry.     Capillary Refill: Capillary refill takes less than 2 seconds.     Coloration: Skin is not cyanotic, jaundiced or pale.     Findings: Lesion (right lower cheek) present. No rash.  Neurological:     General: No focal deficit present.     Mental Status: He is alert and oriented to person, place, and time.     Sensory: Sensation is intact.     Motor: Motor function is intact.     Coordination: Coordination is intact.     Gait: Gait is intact.     Deep Tendon Reflexes: Reflexes are normal and symmetric.  Psychiatric:        Attention and Perception: Attention and perception normal.        Mood and Affect: Mood and affect normal.        Speech: Speech normal.        Behavior: Behavior normal. Behavior is cooperative.        Thought Content: Thought content normal.        Cognition and Memory: Cognition and memory normal.        Judgment: Judgment normal.    Results for orders placed or performed in visit on 02/07/22  POCT INR  Result Value Ref Range   INR 2.3 2.0 - 3.0       Pertinent labs & imaging results that were available during my care of the patient were reviewed by me and considered in my medical decision making.  Assessment & Plan:  Kaide was seen today for anticoagulation.  Diagnoses and all orders for this visit:  Chronic atrial fibrillation ([place]) -     CoaguChek XS/INR Waived Chronic anticoagulation -     CoaguChek XS/INR Waived Facial lesion - Painful lesion of the right cheek near angle of jaw. Referral to derm made.  Other orders -     POCT INR 2.3 in goal. Will continue 56m Warfarin daily. Patient to return in 6 weeks.     Continue all other maintenance medications.  Follow up plan: Return in about 6 weeks (around 03/21/2022) for INR.   Continue healthy lifestyle choices, including diet (rich in fruits, vegetables, and lean proteins, and low in salt and simple carbohydrates) and exercise (at least 30 minutes of moderate physical activity  daily).   The above assessment and management plan was discussed with the patient. The patient verbalized understanding of and has agreed to the management plan. Patient is aware to call the clinic if they develop any new symptoms or if symptoms persist or worsen. Patient is aware when to return to the clinic for a follow-up visit. Patient educated on when it is appropriate to go to the emergency department.   Addison Belmira Daley NP-S  I personally was present during the history, physical exam, and medical decision-making activities of this visit and have verified that the services and findings are accurately documented in the nurse practitioner student's note.  MMonia Pouch FNP-C [facility] [address]MWatcht[address] Davie [phone]

## 2022-02-13 DIAGNOSIS — L570 Actinic keratosis: Secondary | ICD-10-CM | POA: Diagnosis not present

## 2022-02-13 DIAGNOSIS — L821 Other seborrheic keratosis: Secondary | ICD-10-CM | POA: Diagnosis not present

## 2022-02-13 DIAGNOSIS — Z1283 Encounter for screening for malignant neoplasm of skin: Secondary | ICD-10-CM | POA: Diagnosis not present

## 2022-02-15 ENCOUNTER — Other Ambulatory Visit: Payer: Self-pay | Admitting: Family Medicine

## 2022-03-07 ENCOUNTER — Other Ambulatory Visit: Payer: Self-pay | Admitting: *Deleted

## 2022-03-07 MED ORDER — TRIAMTERENE-HCTZ 37.5-25 MG PO TABS: 1.0000 | ORAL_TABLET | Freq: Every day | ORAL | 0 refills | Status: DC

## 2022-03-15 DIAGNOSIS — H0102A Squamous blepharitis right eye, upper and lower eyelids: Secondary | ICD-10-CM | POA: Diagnosis not present

## 2022-03-15 DIAGNOSIS — H11823 Conjunctivochalasis, bilateral: Secondary | ICD-10-CM | POA: Diagnosis not present

## 2022-03-15 DIAGNOSIS — H0102B Squamous blepharitis left eye, upper and lower eyelids: Secondary | ICD-10-CM | POA: Diagnosis not present

## 2022-03-15 DIAGNOSIS — Z961 Presence of intraocular lens: Secondary | ICD-10-CM | POA: Diagnosis not present

## 2022-03-15 DIAGNOSIS — H43811 Vitreous degeneration, right eye: Secondary | ICD-10-CM | POA: Diagnosis not present

## 2022-03-15 DIAGNOSIS — H02831 Dermatochalasis of right upper eyelid: Secondary | ICD-10-CM | POA: Diagnosis not present

## 2022-03-15 DIAGNOSIS — H02834 Dermatochalasis of left upper eyelid: Secondary | ICD-10-CM | POA: Diagnosis not present

## 2022-03-16 ENCOUNTER — Other Ambulatory Visit: Payer: Self-pay | Admitting: Family Medicine

## 2022-03-22 DIAGNOSIS — M79676 Pain in unspecified toe(s): Secondary | ICD-10-CM | POA: Diagnosis not present

## 2022-03-22 DIAGNOSIS — L84 Corns and callosities: Secondary | ICD-10-CM | POA: Diagnosis not present

## 2022-03-22 DIAGNOSIS — I70203 Unspecified atherosclerosis of native arteries of extremities, bilateral legs: Secondary | ICD-10-CM | POA: Diagnosis not present

## 2022-03-22 DIAGNOSIS — B351 Tinea unguium: Secondary | ICD-10-CM | POA: Diagnosis not present

## 2022-03-26 ENCOUNTER — Ambulatory Visit (INDEPENDENT_AMBULATORY_CARE_PROVIDER_SITE_OTHER): Payer: Medicare Other | Admitting: Family Medicine

## 2022-03-26 ENCOUNTER — Encounter: Payer: Self-pay | Admitting: Family Medicine

## 2022-03-26 VITALS — BP 129/70 | HR 81 | Temp 98.0°F | Ht 72.0 in | Wt 174.8 lb

## 2022-03-26 DIAGNOSIS — I482 Chronic atrial fibrillation, unspecified: Secondary | ICD-10-CM | POA: Diagnosis not present

## 2022-03-26 DIAGNOSIS — Z7901 Long term (current) use of anticoagulants: Secondary | ICD-10-CM | POA: Diagnosis not present

## 2022-03-26 LAB — COAGUCHEK XS/INR WAIVED
INR: 2.9 — ABNORMAL HIGH (ref 0.9–1.1)
Prothrombin Time: 34.8 s

## 2022-03-27 NOTE — Progress Notes (Signed)
? ?Subjective: ?CC:inr check ?PCP: [name], DO ?HPI:Dalton Vaughan is a 88 y.o. male presenting to clinic today for: ? ?1.  Chronic atrial fibrillation ?Goal INR 2-3 ?Compliant with Coumadin.  He denies any prolonged epistaxis.  No rectal bleeding or hematuria.  No missed doses of Coumadin. ? ? ?ROS: Per HPI ? ?Allergies  ?Allergen Reactions  ? Paxil [Paroxetine Hcl] Palpitations  ? Codeine Other (See Comments)  ? Eliquis [Apixaban] Other (See Comments)  ?  dizziness  ? Penicillins Other (See Comments)  ?  Did it involve swelling of the face/tongue/throat, SOB, or low BP? Unknown ?Did it involve sudden or severe rash/hives, skin peeling, or any reaction on the inside of your mouth or nose? Unknown ?Did you need to seek medical attention at a hospital or doctor's office? Unknown ?When did it last happen?      unk ?If all above answers are ?NO?, may proceed with cephalosporin use. ?  ? Pravachol [Pravastatin Sodium] Other (See Comments)  ?  myalgias  ? Zetia [Ezetimibe] Other (See Comments)  ?  myalgias  ? Vytorin [Ezetimibe-Simvastatin] Other (See Comments)  ?  myalgias  ? ?Past Medical History:  ?Diagnosis Date  ? Anal fissure   ? Arthritis   ? Atrial fibrillation ([place])   ? BPH (benign prostatic hypertrophy)   ? CAD (coronary artery disease)   ? Dr. Wynonia Lawman  ? Cataract   ? COVID-19   ? Depression 2004  ? Diverticulosis   ? GERD (gastroesophageal reflux disease)   ? History of TIAs   ? Hyperlipidemia   ? Hypertension   ? Hypertensive heart disease without CHF   ? Hypothyroidism   ? Internal hemorrhoids   ? Lumbar disc disease   ? Oral bleeding 08/20/2012  ? Following molar tooth extraction July, 2013  ? Pneumonia   ? Ruptured disk 1995  ? Shingles   ? Thyroid disease   ? Vitamin D deficiency   ? Vitreous detachment   ? ? ?Current Outpatient Medications:  ?  albuterol (VENTOLIN HFA) 108 (90 Base) MCG/ACT inhaler, Inhale 2 puffs into the lungs every 4 (four) hours as needed., Disp: , Rfl:  ?  amLODipine  (NORVASC) 5 MG tablet, TAKE 1 TABLET DAILY, Disp: 90 tablet, Rfl: 1 ?  Calcium Carb-Cholecalciferol (CALCIUM + D3) 600-200 MG-UNIT TABS, Take 1 tablet by mouth daily. , Disp: , Rfl:  ?  cetirizine (ZYRTEC ALLERGY) 10 MG tablet, Take 0.5 tablets (5 mg total) by mouth at bedtime as needed for allergies (drainage)., Disp: 90 tablet, Rfl: 3 ?  Cholecalciferol (VITAMIN D-3) 5000 UNITS TABS, Take 1 tablet by mouth daily., Disp: , Rfl:  ?  doxazosin (CARDURA) 8 MG tablet, TAKE 1 TABLET AT BEDTIME, Disp: 90 tablet, Rfl: 3 ?  lidocaine (XYLOCAINE) 5 % ointment, Apply 1 application topically as needed., Disp: 35.44 g, Rfl: 0 ?  mirtazapine (REMERON) 15 MG tablet, TAKE 1 TABLET AT BEDTIME, Disp: 90 tablet, Rfl: 3 ?  Multiple Vitamin (MULTIVITAMIN) tablet, Take 1 tablet by mouth daily., Disp: , Rfl:  ?  ramipril (ALTACE) 10 MG capsule, Take 1 capsule (10 mg total) by mouth daily., Disp: 90 capsule, Rfl: 3 ?  SIMETHICONE PO, Take by mouth as needed., Disp: , Rfl:  ?  SYNTHROID 50 MCG tablet, TAKE 1 TABLET DAILY BEFORE BREAKFAST, Disp: 90 tablet, Rfl: 3 ?  triamterene-hydrochlorothiazide (MAXZIDE-25) 37.5-25 MG tablet, Take 1 tablet by mouth daily., Disp: 90 tablet, Rfl: 0 ?  vitamin C (ASCORBIC ACID)  500 MG tablet, Take 500 mg by mouth daily. , Disp: , Rfl:  ?  warfarin (COUMADIN) 5 MG tablet, TAKE 1 TABLET DAILY AS DIRECTED, Disp: 90 tablet, Rfl: 0 ?  XIIDRA 5 % SOLN, Apply 1 drop to eye as needed. , Disp: , Rfl:  ?Social History  ? ?Socioeconomic History  ? Marital status: Widowed  ?  Spouse name: [name]   ? Number of children: 1  ? Years of education: 85  ? Highest education level: Not on file  ?Occupational History  ? Occupation: Retired  ?  CommentChief Strategy Officer x 27 years  ? Occupation: Retired  ?  Employer: [employer]  ?  Comment: supervisor x 17 years  ?Tobacco Use  ? Smoking status: Former  ?  Packs/day: 1.00  ?  Years: 18.00  ?  Pack years: 18.00  ?  Types: Cigarettes  ?  Start date: 12/17/1948  ?  Quit date: 12/17/1966   ?  Years since quitting: 55.3  ? Smokeless tobacco: Never  ?Vaping Use  ? Vaping Use: Never used  ?Substance and Sexual Activity  ? Alcohol use: Yes  ?  Alcohol/week: 1.0 standard drink  ?  Types: 1 Standard drinks or equivalent per week  ?  Comment: rare  ? Drug use: No  ? Sexual activity: Not on file  ?Other Topics Concern  ? Not on file  ?Social History Narrative  ? 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   ? Right-handed  ?   ? Caffeine use: none  ?   ? Son and grandson live with him  ? ?Social Determinants of Health  ? ?Financial Resource Strain: Low Risk   ? Difficulty of Paying Living Expenses: Not hard at all  ?Food Insecurity: No Food Insecurity  ? Worried About Charity fundraiser in the Last Year: Never true  ? Ran Out of Food in the Last Year: Never true  ?Transportation Needs: No Transportation Needs  ? Lack of Transportation (Medical): No  ? Lack of Transportation (Non-Medical): No  ?Physical Activity: Sufficiently Active  ? Days of Exercise per Week: 5 days  ? Minutes of Exercise per Session: 50 min  ?Stress: No Stress Concern Present  ? Feeling of Stress : Only a little  ?Social Connections: Moderately Isolated  ? Frequency of Communication with Friends and Family: More than three times a week  ? Frequency of Social Gatherings with Friends and Family: More than three times a week  ? Attends Religious Services: Never  ? Active Member of Clubs or Organizations: Yes  ? Attends Archivist Meetings: 1 to 4 times per year  ? Marital Status: Widowed  ?Intimate Partner Violence: Not At Risk  ? Fear of Current or Ex-Partner: No  ? Emotionally Abused: No  ? Physically Abused: No  ? Sexually Abused: No  ? ?Family History  ?Problem Relation Age of Onset  ? Dementia Mother   ? Arthritis Mother   ? Cancer Brother   ?     prostate  ? Asthma Sister   ? Heart disease Paternal Aunt   ? Heart disease Paternal Uncle   ? Hypertension Maternal Grandmother   ? CVA Maternal  Grandmother   ? CVA Paternal Grandmother   ? Hepatitis C Son   ? Arthritis Son   ? Colon cancer Neg Hx   ? Colon polyps Neg Hx   ? Kidney disease Neg Hx   ? Esophageal  cancer Neg Hx   ? Gallbladder disease Neg Hx   ? Diabetes Neg Hx   ? ? ?Objective: ?Office vital signs reviewed. ?BP 129/70   Pulse 81   Temp 98 ?F (36.7 ?C)   Ht 6' (1.829 m)   Wt 174 lb 12.8 oz (79.3 kg)   SpO2 96%   BMI 23.71 kg/m?  ? ?Physical Examination:  ?General: Awake, alert, well nourished, No acute distress ?HEENT: White.  Moist mucous membranes ?Cardio: Irregularly irregular with rate control, S1S2 heard, no murmurs appreciated ?Pulm: clear to auscultation bilaterally, no wheezes, rhonchi or rales; normal work of breathing on room air ?MSK: Ambulating independently ? ?Assessment/ Plan: ?88 y.o. male  ? ?Chronic atrial fibrillation (HCC) ? ?Chronic anticoagulation - Plan: CoaguChek XS/INR Waived ? ?INR therapeutic at 2.9 today.  No changes.  May follow-up in 6 to 8 weeks, sooner if concerns arise ? ?Orders Placed This Encounter  ?Procedures  ? CoaguChek XS/INR Waived  ? ?No orders of the defined types were placed in this encounter. ? ? ? ?[name], DO ?[place] ?[phone] ? ?

## 2022-04-21 DIAGNOSIS — I495 Sick sinus syndrome: Secondary | ICD-10-CM | POA: Diagnosis not present

## 2022-05-02 ENCOUNTER — Other Ambulatory Visit: Payer: Self-pay | Admitting: Family Medicine

## 2022-05-02 DIAGNOSIS — I700 Atherosclerosis of aorta: Secondary | ICD-10-CM

## 2022-05-08 ENCOUNTER — Encounter: Payer: Self-pay | Admitting: Family Medicine

## 2022-05-08 ENCOUNTER — Ambulatory Visit (INDEPENDENT_AMBULATORY_CARE_PROVIDER_SITE_OTHER): Payer: Medicare Other | Admitting: Family Medicine

## 2022-05-08 VITALS — BP 136/75 | HR 83 | Temp 97.5°F | Ht 72.0 in | Wt 173.2 lb

## 2022-05-08 DIAGNOSIS — I482 Chronic atrial fibrillation, unspecified: Secondary | ICD-10-CM | POA: Diagnosis not present

## 2022-05-08 DIAGNOSIS — L819 Disorder of pigmentation, unspecified: Secondary | ICD-10-CM | POA: Diagnosis not present

## 2022-05-08 DIAGNOSIS — Z7901 Long term (current) use of anticoagulants: Secondary | ICD-10-CM | POA: Diagnosis not present

## 2022-05-08 LAB — HEMOGLOBIN, FINGERSTICK: Hemoglobin: 12.6 g/dL (ref 12.6–17.7)

## 2022-05-08 LAB — COAGUCHEK XS/INR WAIVED
INR: 2.9 — ABNORMAL HIGH (ref 0.9–1.1)
Prothrombin Time: 34.4 s

## 2022-05-08 NOTE — Progress Notes (Signed)
Subjective: CC: Atrial fibrillation PCP: [name], DO HPI:Dalton Vaughan is a 87 y.o. male presenting to clinic today for:  1. Afib Goal INR 2-3. Patient reports compliance with medications.  He does not report any racing heart, bleeding.  2.  Skin lesion Patient reports a dark area on the right ankle that he would like me to look at.  Does not report any pain or swelling.   ROS: Per HPI  Allergies  Allergen Reactions   Paxil [Paroxetine Hcl] Palpitations   Codeine Other (See Comments)   Eliquis [Apixaban] Other (See Comments)    dizziness   Penicillins Other (See Comments)    Did it involve swelling of the face/tongue/throat, SOB, or low BP? Unknown Did it involve sudden or severe rash/hives, skin peeling, or any reaction on the inside of your mouth or nose? Unknown Did you need to seek medical attention at a hospital or doctor's office? Unknown When did it last happen?      unk If all above answers are "NO", may proceed with cephalosporin use.    Pravachol [Pravastatin Sodium] Other (See Comments)    myalgias   Zetia [Ezetimibe] Other (See Comments)    myalgias   Vytorin [Ezetimibe-Simvastatin] Other (See Comments)    myalgias   Past Medical History:  Diagnosis Date   Anal fissure    Arthritis    Atrial fibrillation (HCC)    BPH (benign prostatic hypertrophy)    CAD (coronary artery disease)    Dr. Wynonia Lawman   Cataract    COVID-19    Depression 2004   Diverticulosis    GERD (gastroesophageal reflux disease)    History of TIAs    Hyperlipidemia    Hypertension    Hypertensive heart disease without CHF    Hypothyroidism    Internal hemorrhoids    Lumbar disc disease    Oral bleeding 08/20/2012   Following molar tooth extraction July, 2013   Pneumonia    Ruptured disk 1995   Shingles    Thyroid disease    Vitamin D deficiency    Vitreous detachment     Current Outpatient Medications:    albuterol (VENTOLIN HFA) 108 (90 Base) MCG/ACT inhaler,  Inhale 2 puffs into the lungs every 4 (four) hours as needed., Disp: , Rfl:    amLODipine (NORVASC) 5 MG tablet, TAKE 1 TABLET DAILY, Disp: 90 tablet, Rfl: 1   Calcium Carb-Cholecalciferol (CALCIUM + D3) 600-200 MG-UNIT TABS, Take 1 tablet by mouth daily. , Disp: , Rfl:    cetirizine (ZYRTEC ALLERGY) 10 MG tablet, Take 0.5 tablets (5 mg total) by mouth at bedtime as needed for allergies (drainage)., Disp: 90 tablet, Rfl: 3   Cholecalciferol (VITAMIN D-3) 5000 UNITS TABS, Take 1 tablet by mouth daily., Disp: , Rfl:    doxazosin (CARDURA) 8 MG tablet, TAKE 1 TABLET AT BEDTIME, Disp: 90 tablet, Rfl: 3   lidocaine (XYLOCAINE) 5 % ointment, Apply 1 application topically as needed., Disp: 35.44 g, Rfl: 0   mirtazapine (REMERON) 15 MG tablet, TAKE 1 TABLET AT BEDTIME, Disp: 90 tablet, Rfl: 3   Multiple Vitamin (MULTIVITAMIN) tablet, Take 1 tablet by mouth daily., Disp: , Rfl:    ramipril (ALTACE) 10 MG capsule, TAKE 1 CAPSULE DAILY, Disp: 90 capsule, Rfl: 3   SIMETHICONE PO, Take by mouth as needed., Disp: , Rfl:    SYNTHROID 50 MCG tablet, TAKE 1 TABLET DAILY BEFORE BREAKFAST, Disp: 90 tablet, Rfl: 3   triamterene-hydrochlorothiazide (MAXZIDE-25) 37.5-25 MG tablet, Take  1 tablet by mouth daily., Disp: 90 tablet, Rfl: 0   vitamin C (ASCORBIC ACID) 500 MG tablet, Take 500 mg by mouth daily. , Disp: , Rfl:    warfarin (COUMADIN) 5 MG tablet, TAKE 1 TABLET DAILY AS DIRECTED, Disp: 90 tablet, Rfl: 0   XIIDRA 5 % SOLN, Apply 1 drop to eye as needed. 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  Tobacco Use   Smoking status: Former    Packs/day: 1.00    Years: 18.00    Pack years: 18.00     Types: Cigarettes    Start date: 12/17/1948    Quit date: 12/17/1966    Years since quitting: 55.4   Smokeless tobacco: Never  Vaping Use   Vaping Use: Never used  Substance and Sexual Activity   Alcohol use: Yes    Alcohol/week: 1.0 standard drink    Types: 1 Standard drinks or equivalent per week    Comment: rare   Drug use: No   Sexual activity: Not on file  Other Topics Concern   Not on file  Social History Narrative   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, music at [employer]      Right-handed      Caffeine use: none      Son and grandson live with him   Social Determinants of Radio broadcast assistant Strain: Low Risk    Difficulty of Paying Living Expenses: Not hard at all  Food Insecurity: No Food Insecurity   Worried About Charity fundraiser in the Last Year: Never true   Arboriculturist in the Last Year: Never true  Transportation Needs: No Transportation Needs   Lack of Transportation (Medical): No   Lack of Transportation (Non-Medical): No  Physical Activity: Sufficiently Active   Days of Exercise per Week: 5 days   Minutes of Exercise per Session: 50 min  Stress: No Stress Concern Present   Feeling of Stress : Only a little  Social Connections: Moderately Isolated   Frequency of Communication with Friends and Family: More than three times a week   Frequency of Social Gatherings with Friends and Family: More than three times a week   Attends Religious Services: Never   Marine scientist or Organizations: Yes   Attends Archivist Meetings: 1 to 4 times per year   Marital Status: Widowed  Human resources officer Violence: Not At Risk   Fear of Current or Ex-Partner: No   Emotionally Abused: No   Physically Abused: No   Sexually Abused: No   Family History  Problem Relation Age of Onset   Dementia Mother    Arthritis Mother    Cancer Brother        prostate   Asthma Sister    Heart disease Paternal Aunt    Heart disease Paternal  Uncle    Hypertension Maternal Grandmother    CVA Maternal Grandmother    CVA Paternal Grandmother    Hepatitis C Son    Arthritis Son    Colon cancer Neg Hx  Colon polyps Neg Hx    Kidney disease Neg Hx    Esophageal cancer Neg Hx    Gallbladder disease Neg Hx    Diabetes Neg Hx     Objective: Office vital signs reviewed. BP 136/75   Pulse 83   Temp (!) 97.5 F (36.4 C)   Ht 6' (1.829 m)   Wt 173 lb 3.2 oz (78.6 kg)   SpO2 97%   BMI 23.49 kg/m   Physical Examination:  General: Awake, alert, well-appearing, nontoxic elderly male, No acute distress HEENT: Sclera white.  Moist mucous membranes Cardio: regular rate and rhythm, S1S2 heard, no murmurs appreciated Pulm: clear to auscultation bilaterally, no wheezes, rhonchi or rales; normal work of breathing on room air Skin: Hyperpigmentation with associated mild varicose veins noted along the right lateral ankle  Assessment/ Plan: 88 y.o. male   Chronic atrial fibrillation ([place]) - Plan: Hemoglobin, fingerstick, CoaguChek XS/INR Waived  Chronic anticoagulation - Plan: Hemoglobin, fingerstick, CoaguChek XS/INR Waived  Hyperpigmentation  Seems to be both rate and rhythm controlled on exam.  Hemoglobin stick did not demonstrate any anemia.  INR is therapeutic at 2.9.  No changes.  May follow-up in 6 weeks  Area of concern along the right lateral leg simply appears to be some postinflammatory hyperpigmentation.  No evidence of cellulitis or other concerning features  Orders Placed This Encounter  Procedures   Hemoglobin, fingerstick   CoaguChek XS/INR Waived   No orders of the defined types were placed in this encounter.    [name], DO [place] [phone]

## 2022-05-16 ENCOUNTER — Other Ambulatory Visit: Payer: Self-pay | Admitting: Family Medicine

## 2022-05-25 ENCOUNTER — Other Ambulatory Visit: Payer: Self-pay | Admitting: Family Medicine

## 2022-05-31 DIAGNOSIS — I70203 Unspecified atherosclerosis of native arteries of extremities, bilateral legs: Secondary | ICD-10-CM | POA: Diagnosis not present

## 2022-05-31 DIAGNOSIS — M79676 Pain in unspecified toe(s): Secondary | ICD-10-CM | POA: Diagnosis not present

## 2022-05-31 DIAGNOSIS — B351 Tinea unguium: Secondary | ICD-10-CM | POA: Diagnosis not present

## 2022-05-31 DIAGNOSIS — L84 Corns and callosities: Secondary | ICD-10-CM | POA: Diagnosis not present

## 2022-06-06 DIAGNOSIS — L820 Inflamed seborrheic keratosis: Secondary | ICD-10-CM | POA: Diagnosis not present

## 2022-06-06 DIAGNOSIS — D485 Neoplasm of uncertain behavior of skin: Secondary | ICD-10-CM | POA: Diagnosis not present

## 2022-06-27 ENCOUNTER — Ambulatory Visit (INDEPENDENT_AMBULATORY_CARE_PROVIDER_SITE_OTHER): Payer: Medicare Other | Admitting: Family Medicine

## 2022-06-27 ENCOUNTER — Encounter: Payer: Self-pay | Admitting: Family Medicine

## 2022-06-27 VITALS — BP 143/74 | HR 89 | Temp 97.4°F | Ht 72.0 in | Wt 172.6 lb

## 2022-06-27 DIAGNOSIS — R5381 Other malaise: Secondary | ICD-10-CM

## 2022-06-27 DIAGNOSIS — Z7901 Long term (current) use of anticoagulants: Secondary | ICD-10-CM

## 2022-06-27 DIAGNOSIS — R2689 Other abnormalities of gait and mobility: Secondary | ICD-10-CM | POA: Diagnosis not present

## 2022-06-27 DIAGNOSIS — D692 Other nonthrombocytopenic purpura: Secondary | ICD-10-CM | POA: Diagnosis not present

## 2022-06-27 LAB — COAGUCHEK XS/INR WAIVED
INR: 2.1 — ABNORMAL HIGH (ref 0.9–1.1)
Prothrombin Time: 25.7 s

## 2022-06-27 NOTE — Patient Instructions (Signed)
Exercise Information for Aging Adults Staying physically active is important as you age. Physical activity and exercise can help in maintaining quality of life, health, physical function, and reducing falls. The four types of exercises that are best for older adults are endurance, strength, balance, and flexibility. Contact your health care provider before you start any exercise routine. Ask your health care provider what activities are safe for you. What are the risks? Risks associated with exercising include: Overdoing it. This may lead to sore muscles or fatigue. Falls. Injuries. Dehydration. How to do these exercises Endurance exercises Endurance (aerobic) exercises raise your breathing rate and heart rate. Increasing your endurance helps you do everyday tasks and stay healthy. By improving the health of your body system that includes your heart, lungs, and blood vessels (circulatory system), you may also delay or prevent diseases such as heart disease, diabetes, and weak bones (osteoporosis). Types of endurance exercises include: Sports. Indoor activities, such as using gym equipment, doing water aerobics, or dancing. Outdoor activities, such as biking or jogging. Tasks around the house, such as gardening, yard work, and heavy household chores like cleaning. Walking, such as hiking or walking around your neighborhood. When doing endurance exercises, make sure you: Are aware of your surroundings. Use safety equipment as directed. Dress in layers when exercising outdoors. Drink plenty of water to stay well hydrated. Build up endurance slowly. Start with 10 minutes at a time, and gradually build up to doing 30 minutes at a time. Unless your health care provider gave you different instructions, aim to exercise for a total of 150 minutes a week. Spread out that time so you are working on endurance 3 or more days a week. Strength exercises Lifting, pulling, or pushing weights helps to  strengthen muscles. Having stronger muscles makes it easier to do everyday activities, such as getting up from a chair, climbing stairs, carrying groceries, and playing with grandchildren. Strength exercises include arm and leg exercises that may be done: With weights. Without weights (using your own body weight). With a resistance band. When doing strength exercises: Move smoothly and steadily. Do not suddenly thrust or jerk the weights, the resistance band, or your body. Start with no weights or with light weights, and gradually add more weight over time. Eventually, aim to use weights that are hard or very hard for you to lift. This means that you are able to do 8 repetitions with the weight, and the last few repetitions are very challenging. Lift or push weights into position for 3 seconds, hold the position for 1 second, and then take 3 seconds to return to your starting position. Breathe out (exhale) during difficult movements, like lifting or pushing weights. Breathe in (inhale) to relax your muscles before the next repetition. Consider alternating arms or legs, especially when you first start strength exercises. Expect some slight muscle soreness after each session. Do strength exercises on 2 or more days a week, for 30 minutes at a time. Avoid exercising the same muscle groups two days in a row. For example, if you work on your leg muscles one day, work on your arm muscles the next day. When you can do two sets of 10-15 repetitions with a certain weight, increase the amount of weight. Balance exercises Balance exercises can help to prevent falls. Balance exercises include: Standing on one foot. Heel-to-toe walk. Balance walk. Tai chi. Make sure you have something sturdy to hold onto while doing balance exercises, such as a sturdy chair. As your balance   improves, challenge yourself by holding on to the chair with one hand instead of two, and then with no hands. Trying exercises with your  eyes closed also challenges your balance, but be sure to have a sturdy surface (like a countertop) close by in case you need it. Do balance exercises as often as you want, or as often as directed by your health care provider. Flexibility exercises  Flexibility exercises improve how far you can bend, straighten, move, or rotate parts of your body (range of motion). These exercises also help you do everyday activities such as getting dressed or reaching for objects. Flexibility exercises include stretching different parts of the body, and they may be done in a standing or seated position or on the floor. When stretching, make sure you: Keep a slight bend in your arms and legs. Avoid completely straightening ("locking") your joints. Do not stretch so far that you feel pain. You should feel a mild stretching feeling. You may try stretching farther as you become more flexible over time. Relax and breathe between stretches. Hold on to something sturdy for balance as needed. Hold each stretch for 10-30 seconds. Repeat each stretch 3-5 times. General safety tips Exercise in well-lit areas. Do not hold your breath during exercises or stretches. Warm up before exercising, and cool down after exercising. This can help prevent injury. Drink plenty of water during exercise or any activity that makes you sweat. If you are not sure if an exercise is safe for you, or you are not sure how to do an exercise, talk with your health care provider. This is especially important if you have had surgery on muscles, bones, or joints (orthopedic surgery). Where to find more information You can find more information about exercise for older adults from: Your local health department, fitness center, or community center. These facilities may have programs for aging adults. National Institute on Aging: www.nia.nih.gov National Council on Aging: www.ncoa.org Summary Staying physically active is important as you age. Doing  endurance, strength, balance, and flexibility exercises can help in maintaining quality of life, health, physical function, and reducing falls. Make sure to contact your health care provider before you start any exercise routine. Ask your health care provider what activities are safe for you. This information is not intended to replace advice given to you by your health care provider. Make sure you discuss any questions you have with your health care provider. Document Revised: 04/17/2021 Document Reviewed: 04/17/2021 Elsevier Patient Education  2023 Elsevier Inc.  

## 2022-06-27 NOTE — Progress Notes (Signed)
Subjective: CC: INR check PCP: [name], DO HPI:Dalton Vaughan is a 88 y.o. male presenting to clinic today for:  1.  Atrial fibrillation Patient is chronically anticoagulated with Coumadin for atrial fibrillation.  Goal INR 2-3.  He does not report any bleeding.  He has easy bruising.  No significant runs of A-fib but he does occasionally have skipped beats.  He is compliant with all medications for his A-fib.  2.  Hypertension/balance Compliant with antihypertensive.  No chest pain, shortness of breath.  Tolerating exercise without difficulty but sometimes does feel like his balance and agility is not as good as it should be.  He exercises regularly by using the treadmill, weightlifting and cycling on a stationary bike.  No dizziness.  No falls   ROS: Per HPI  Allergies  Allergen Reactions   Paxil [Paroxetine Hcl] Palpitations   Codeine Other (See Comments)   Eliquis [Apixaban] Other (See Comments)    dizziness   Penicillins Other (See Comments)    Did it involve swelling of the face/tongue/throat, SOB, or low BP? Unknown Did it involve sudden or severe rash/hives, skin peeling, or any reaction on the inside of your mouth or nose? Unknown Did you need to seek medical attention at a hospital or doctor's office? Unknown When did it last happen?      unk If all above answers are "NO", may proceed with cephalosporin use.    Pravachol [Pravastatin Sodium] Other (See Comments)    myalgias   Zetia [Ezetimibe] Other (See Comments)    myalgias   Vytorin [Ezetimibe-Simvastatin] Other (See Comments)    myalgias   Past Medical History:  Diagnosis Date   Anal fissure    Arthritis    Atrial fibrillation (HCC)    BPH (benign prostatic hypertrophy)    CAD (coronary artery disease)    Dr. Wynonia Lawman   Cataract    COVID-19    Depression 2004   Diverticulosis    GERD (gastroesophageal reflux disease)    History of TIAs    Hyperlipidemia    Hypertension    Hypertensive heart  disease without CHF    Hypothyroidism    Internal hemorrhoids    Lumbar disc disease    Oral bleeding 08/20/2012   Following molar tooth extraction July, 2013   Pneumonia    Ruptured disk 1995   Shingles    Thyroid disease    Vitamin D deficiency    Vitreous detachment     Current Outpatient Medications:    albuterol (VENTOLIN HFA) 108 (90 Base) MCG/ACT inhaler, Inhale 2 puffs into the lungs every 4 (four) hours as needed., Disp: , Rfl:    amLODipine (NORVASC) 5 MG tablet, TAKE 1 TABLET DAILY, Disp: 90 tablet, Rfl: 1   Calcium Carb-Cholecalciferol (CALCIUM + D3) 600-200 MG-UNIT TABS, Take 1 tablet by mouth daily. , Disp: , Rfl:    cetirizine (ZYRTEC ALLERGY) 10 MG tablet, Take 0.5 tablets (5 mg total) by mouth at bedtime as needed for allergies (drainage)., Disp: 90 tablet, Rfl: 3   Cholecalciferol (VITAMIN D-3) 5000 UNITS TABS, Take 1 tablet by mouth daily., Disp: , Rfl:    doxazosin (CARDURA) 8 MG tablet, TAKE 1 TABLET AT BEDTIME, Disp: 90 tablet, Rfl: 3   lidocaine (XYLOCAINE) 5 % ointment, Apply 1 application topically as needed., Disp: 35.44 g, Rfl: 0   mirtazapine (REMERON) 15 MG tablet, TAKE 1 TABLET AT BEDTIME, Disp: 90 tablet, Rfl: 3   Multiple Vitamin (MULTIVITAMIN) tablet, Take 1 tablet by  mouth daily., Disp: , Rfl:    ramipril (ALTACE) 10 MG capsule, TAKE 1 CAPSULE DAILY, Disp: 90 capsule, Rfl: 3   SIMETHICONE PO, Take by mouth as needed., Disp: , Rfl:    SYNTHROID 50 MCG tablet, TAKE 1 TABLET DAILY BEFORE BREAKFAST, Disp: 90 tablet, Rfl: 3   triamterene-hydrochlorothiazide (MAXZIDE-25) 37.5-25 MG tablet, TAKE 1 TABLET DAILY, Disp: 90 tablet, Rfl: 0   vitamin C (ASCORBIC ACID) 500 MG tablet, Take 500 mg by mouth daily. , Disp: , Rfl:    warfarin (COUMADIN) 5 MG tablet, TAKE 1 TABLET DAILY AS DIRECTED, Disp: 90 tablet, Rfl: 0   XIIDRA 5 % SOLN, Apply 1 drop to eye as needed. 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  Tobacco Use   Smoking status: Former    Packs/day: 1.00    Years: 18.00    Total pack years: 18.00    Types: Cigarettes    Start date: 12/17/1948    Quit date: 12/17/1966    Years since quitting: 55.5   Smokeless tobacco: Never  Vaping Use   Vaping Use: Never used  Substance and Sexual Activity   Alcohol use: Yes    Alcohol/week: 1.0 standard drink of alcohol    Types: 1 Standard drinks or equivalent per week    Comment: rare   Drug use: No   Sexual activity: Not on file  Other Topics Concern   Not on file  Social History Narrative   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, music at [employer]      Right-handed      Caffeine use: none      Son and grandson live with him   Social Determinants of Health   Financial Resource Strain: Low Risk  (09/05/2021)   Overall Financial Resource Strain (CARDIA)    Difficulty of Paying Living Expenses: Not hard at all  Food Insecurity: No Food Insecurity (09/05/2021)   Hunger Vital Sign    Worried About Running Out of Food in the Last Year: Never true    [place] in the Last Year: Never true  Transportation Needs: No Transportation Needs (09/05/2021)   PRAPARE - Hydrologist (Medical): No    Lack of Transportation (Non-Medical): No  Physical Activity: Sufficiently Active (09/05/2021)   Exercise Vital Sign    Days of Exercise per Week: 5 days    Minutes of Exercise per Session: 50 min  Stress: No Stress Concern Present (09/05/2021)   [place]    Feeling of Stress : Only a little  Social Connections: Moderately Isolated (09/05/2021)   Social Connection and Isolation Panel  [NHANES]    Frequency of Communication with Friends and Family: More than three times a week    Frequency of Social Gatherings with Friends and Family: More than three times a week    Attends Religious Services: Never    Marine scientist or Organizations: Yes    Attends Archivist Meetings: 1 to 4 times per year  Marital Status: Widowed  Intimate Partner Violence: Not At Risk (09/05/2021)   Humiliation, Afraid, Rape, and Kick questionnaire    Fear of Current or Ex-Partner: No    Emotionally Abused: No    Physically Abused: No    Sexually Abused: No   Family History  Problem Relation Age of Onset   Dementia Mother    Arthritis Mother    Cancer Brother        prostate   Asthma Sister    Heart disease Paternal Aunt    Heart disease Paternal Uncle    Hypertension Maternal Grandmother    CVA Maternal Grandmother    CVA Paternal Grandmother    Hepatitis C Son    Arthritis Son    Colon cancer Neg Hx    Colon polyps Neg Hx    Kidney disease Neg Hx    Esophageal cancer Neg Hx    Gallbladder disease Neg Hx    Diabetes Neg Hx     Objective: Office vital signs reviewed. BP (!) 143/74   Pulse 89   Temp (!) 97.4 F (36.3 C)   Ht 6' (1.829 m)   Wt 172 lb 9.6 oz (78.3 kg)   SpO2 96%   BMI 23.41 kg/m   Physical Examination:  General: Awake, alert, nontoxic elderly male, No acute distress HEENT: Sclera white.  Moist mucous membranes.  TMs intact bilaterally without any evidence of infection or fluid behind the tympanic membranes that is causing any bulging or other abnormalities Cardio: regular rate and rhythm, S1S2 heard, no murmurs appreciated Pulm: clear to auscultation bilaterally, no wheezes, rhonchi or rales; normal work of breathing on room air Skin: Senile purpura/petechiae noted MSK: Ambulating independently with slow gait and mildly hunched station.  He does utilize both hands to push off from a seated position. Neuro: No focal neurologic deficits.  No  nystagmus.  Assessment/ Plan: 88 y.o. male   Chronic anticoagulation - Plan: CoaguChek XS/INR Waived  Purpura senilis (HCC)  Physical deconditioning - Plan: Ambulatory referral to Physical Therapy  Balance problem - Plan: Ambulatory referral to Physical Therapy  INR therapeutic at 2.1.  No changes.  May follow-up in 6 to 8 weeks.  Purpura senilis likely secondary to the warfarin  I suspect he needs some core strengthening to improve agility and balance.  I have placed a referral to physical therapy to assist with this.   Orders Placed This Encounter  Procedures   CoaguChek XS/INR Waived   No orders of the defined types were placed in this encounter.    [name], DO [place] [phone]

## 2022-07-04 ENCOUNTER — Ambulatory Visit: Payer: Medicare Other | Attending: Family Medicine

## 2022-07-04 ENCOUNTER — Other Ambulatory Visit: Payer: Self-pay

## 2022-07-04 DIAGNOSIS — R5381 Other malaise: Secondary | ICD-10-CM | POA: Diagnosis not present

## 2022-07-04 DIAGNOSIS — M6281 Muscle weakness (generalized): Secondary | ICD-10-CM | POA: Insufficient documentation

## 2022-07-04 DIAGNOSIS — R2689 Other abnormalities of gait and mobility: Secondary | ICD-10-CM | POA: Insufficient documentation

## 2022-07-04 DIAGNOSIS — R2681 Unsteadiness on feet: Secondary | ICD-10-CM | POA: Diagnosis not present

## 2022-07-04 NOTE — Therapy (Signed)
OUTPATIENT PHYSICAL THERAPY LOWER EXTREMITY EVALUATION   Patient Name: Dalton Vaughan MRN: [id number] DOB:August 09, 1929, 88 y.o., male Today's Date: 07/04/2022   PT End of Session - 07/04/22 0843     Visit Number 1    Number of Visits 8    Date for PT Re-Evaluation 09/14/22    PT Start Time 6063    PT Stop Time 0927    PT Time Calculation (min) 30 min    Activity Tolerance Patient tolerated treatment well    Behavior During Therapy [facility] for tasks assessed/performed             Past Medical History:  Diagnosis Date   Anal fissure    Arthritis    Atrial fibrillation ([place])    BPH (benign prostatic hypertrophy)    CAD (coronary artery disease)    Dr. Wynonia Lawman   Cataract    COVID-19    Depression 2004   Diverticulosis    GERD (gastroesophageal reflux disease)    History of TIAs    Hyperlipidemia    Hypertension    Hypertensive heart disease without CHF    Hypothyroidism    Internal hemorrhoids    Lumbar disc disease    Oral bleeding 08/20/2012   Following molar tooth extraction July, 2013   Pneumonia    Ruptured disk 1995   Shingles    Thyroid disease    Vitamin D deficiency    Vitreous detachment    Past Surgical History:  Procedure Laterality Date   bilateral cataract surgery  6/07   CARDIAC CATHETERIZATION     CORONARY ARTERY BYPASS GRAFT  12/99    Dr. Servando Snare    cysloscopy  8/08   [place]   right   herninated disc  [place]  11/03/2020   PROSTATE BIOPSY  8/08   TONSILLECTOMY AND ADENOIDECTOMY     Patient Active Problem List   Diagnosis Date Noted   Purpura senilis ([place]) 06/27/2022   Left lower quadrant abdominal pain 11/08/2021   Diverticulitis 08/08/2021   Pacemaker 11/03/2020   Radiculopathy, lumbar region 01/25/2020   Hypertension 01/25/2020   Acute respiratory failure with hypoxia ([place]) 09/16/2019   Osteoarthritis of spine with radiculopathy, lumbar region 01/08/2018   Thyroid disease    Aortic  atherosclerosis ([place]) 05/02/2017   Thrombocytopenia ([place]) 07/10/2016   Cervical spondylosis without myelopathy 04/25/2016   Hereditary and idiopathic peripheral neuropathy 10/16/2015   Polyneuropathy 10/04/2015   Allergic rhinitis 05/06/2014   Depression 08/03/2013   Sick sinus syndrome ([place]) 07/31/2012   Hypertensive heart disease without CHF    Chronic atrial fibrillation (HCC)    Coronary artery disease involving native coronary artery of native heart with angina pectoris (HCC)    History of TIAs    GERD (gastroesophageal reflux disease)    Chronic anticoagulation    Benign prostatic hyperplasia    Hyperlipidemia     REFERRING PROVIDER: [name], DO  REFERRING DIAG: Physical deconditioning; Balance problem  THERAPY DIAG:  Muscle weakness (generalized)  Unsteadiness on feet  Rationale for Evaluation and Treatment Rehabilitation  ONSET DATE: over 1 year ago  SUBJECTIVE:   SUBJECTIVE STATEMENT: Patient feels like his is uncoordinated and it has steadily gotten worse over the past year. He had a problem with his right knee which had limited his mobility. He also cared for his wife which required him to sit with her a lot. He has not fallen recently, but he  notes that he has to make sure is set before trying to get up.   PERTINENT HISTORY: Chronic right knee pain   PAIN:  Are you having pain? Yes: Pain location: right hamstring Aggravating factors: sitting for prolonged periods  PRECAUTIONS: None  WEIGHT BEARING RESTRICTIONS No  FALLS:  Has patient fallen in last 6 months? No  LIVING ENVIRONMENT: Lives with: lives with their son Lives in: House/apartment Stairs: Yes: External: 3 steps; can reach both Has following equipment at home: None  OCCUPATION: retired  PLOF: Independent  PATIENT GOALS get stronger, improved balance   OBJECTIVE:   COGNITION:  Overall cognitive status: Within functional limits for tasks assessed     SENSATION: Patient  reports numbness in both feet and lower legs  POSTURE: No Significant postural limitations  LOWER EXTREMITY ROM: WFL for tasks assessed    LOWER EXTREMITY MMT:  MMT Right eval Left eval  Hip flexion 4/5 4/5  Hip extension    Hip abduction    Hip adduction    Hip internal rotation    Hip external rotation    Knee flexion 4/5 4+/5  Knee extension 4+/5 4+/5  Ankle dorsiflexion 4+/5 4+/5  Ankle plantarflexion    Ankle inversion    Ankle eversion     (Blank rows = not tested)   FUNCTIONAL TESTS:  5 times sit to stand: 22.63 seconds without UE support; right knee discomfort Timed up and go (TUG): 12.46 seconds  GAIT: Assistive device utilized: None Level of assistance: Complete Independence Comments: Wide BOS, reduced gait speed, knee flexed in stance phase bilaterally  BALANCE:   Romberg: 30 seconds with eyes open and closed   Romberg on foam: 30 seconds with eyes open and 3 seconds with eyes closed  Tandem: RLE leading: 3 seconds LLE leading: 30 seconds  TODAY'S TREATMENT:    PATIENT EDUCATION:  Education details: POC, prognosis  Person educated: Patient Education method: Explanation Education comprehension: verbalized understanding   HOME EXERCISE PROGRAM:   ASSESSMENT:  CLINICAL IMPRESSION: Patient is a 88 y.o. male who was seen today for physical therapy evaluation and treatment for deconditioning and unsteadiness on his feet. He is at an elevated fall risk as evidenced by his balance assessment and five time sit to stand speed. Recommend that he continue with skilled physical therapy to address his remaining impairments to maximize his safety and functional mobility.    OBJECTIVE IMPAIRMENTS Abnormal gait, decreased activity tolerance, decreased balance, decreased mobility, difficulty walking, decreased strength, and impaired sensation.   ACTIVITY LIMITATIONS carrying, lifting, bending, standing, stairs, transfers, and locomotion level  PARTICIPATION  LIMITATIONS: community activity and yard work  PERSONAL FACTORS Age, Fitness, Time since onset of injury/illness/exacerbation, and 3+ comorbidities: CHF, a-fib, HTN, OA, and history of TIA  are also affecting patient's functional outcome.   REHAB POTENTIAL: Fair    CLINICAL DECISION MAKING: Evolving/moderate complexity  EVALUATION COMPLEXITY: Moderate   GOALS: Goals reviewed with patient? Yes  LONG TERM GOALS: Target date: 08/01/2022   Patient will be independent with his HEP.  Baseline:  Goal status: INITIAL  2.  Patient will improve his five time sit to stand time to 14 seconds or less for improved lower extremity power and safety.  Baseline:  Goal status: INITIAL  3.  Patient will be able to improve his tandem stance with his RLE leading to at least 15 seconds for improved safety with a narrow base of support.  Baseline:  Goal status: INITIAL  PLAN: PT FREQUENCY: 2x/week  PT DURATION: other: 5 weeks  PLANNED INTERVENTIONS: Therapeutic exercises, Therapeutic activity, Neuromuscular re-education, Balance training, Gait training, Patient/Family education, Self Care, Stair training, and Re-evaluation  PLAN FOR NEXT SESSION: nustep, lower extremity strengthening and balance interventions    Darlin Coco, PT 07/04/2022, 4:06 PM

## 2022-07-06 ENCOUNTER — Encounter: Payer: TRICARE For Life (TFL) | Admitting: Physical Therapy

## 2022-07-10 ENCOUNTER — Ambulatory Visit: Payer: TRICARE For Life (TFL) | Admitting: Physical Therapy

## 2022-07-10 ENCOUNTER — Encounter: Payer: Self-pay | Admitting: Physical Therapy

## 2022-07-10 DIAGNOSIS — R2681 Unsteadiness on feet: Secondary | ICD-10-CM

## 2022-07-10 DIAGNOSIS — R2689 Other abnormalities of gait and mobility: Secondary | ICD-10-CM | POA: Diagnosis not present

## 2022-07-10 DIAGNOSIS — M6281 Muscle weakness (generalized): Secondary | ICD-10-CM | POA: Diagnosis not present

## 2022-07-10 DIAGNOSIS — R5381 Other malaise: Secondary | ICD-10-CM | POA: Diagnosis not present

## 2022-07-10 NOTE — Therapy (Signed)
OUTPATIENT PHYSICAL THERAPY LOWER EXTREMITY EVALUATION   Patient Name: Dalton Vaughan MRN: [id number] DOB:Jul 03, 1929, 88 y.o., male Today's Date: 07/10/2022   PT End of Session - 07/10/22 1303     Visit Number 2    Number of Visits 8    Date for PT Re-Evaluation 09/14/22    PT Start Time 8832    PT Stop Time 5498    PT Time Calculation (min) 41 min    Activity Tolerance Patient tolerated treatment well    Behavior During Therapy [facility] for tasks assessed/performed             Past Medical History:  Diagnosis Date   Anal fissure    Arthritis    Atrial fibrillation ([place])    BPH (benign prostatic hypertrophy)    CAD (coronary artery disease)    Dr. Wynonia Lawman   Cataract    COVID-19    Depression 2004   Diverticulosis    GERD (gastroesophageal reflux disease)    History of TIAs    Hyperlipidemia    Hypertension    Hypertensive heart disease without CHF    Hypothyroidism    Internal hemorrhoids    Lumbar disc disease    Oral bleeding 08/20/2012   Following molar tooth extraction July, 2013   Pneumonia    Ruptured disk 1995   Shingles    Thyroid disease    Vitamin D deficiency    Vitreous detachment    Past Surgical History:  Procedure Laterality Date   bilateral cataract surgery  6/07   CARDIAC CATHETERIZATION     CORONARY ARTERY BYPASS GRAFT  12/99    Dr. Servando Snare    cysloscopy  8/08   [place]   right   herninated disc  [place]  11/03/2020   PROSTATE BIOPSY  8/08   TONSILLECTOMY AND ADENOIDECTOMY     Patient Active Problem List   Diagnosis Date Noted   Purpura senilis ([place]) 06/27/2022   Left lower quadrant abdominal pain 11/08/2021   Diverticulitis 08/08/2021   Pacemaker 11/03/2020   Radiculopathy, lumbar region 01/25/2020   Hypertension 01/25/2020   Acute respiratory failure with hypoxia ([place]) 09/16/2019   Osteoarthritis of spine with radiculopathy, lumbar region 01/08/2018   Thyroid disease    Aortic  atherosclerosis ([place]) 05/02/2017   Thrombocytopenia ([place]) 07/10/2016   Cervical spondylosis without myelopathy 04/25/2016   Hereditary and idiopathic peripheral neuropathy 10/16/2015   Polyneuropathy 10/04/2015   Allergic rhinitis 05/06/2014   Depression 08/03/2013   Sick sinus syndrome ([place]) 07/31/2012   Hypertensive heart disease without CHF    Chronic atrial fibrillation (HCC)    Coronary artery disease involving native coronary artery of native heart with angina pectoris (HCC)    History of TIAs    GERD (gastroesophageal reflux disease)    Chronic anticoagulation    Benign prostatic hyperplasia    Hyperlipidemia     REFERRING PROVIDER: [name], DO  REFERRING DIAG: Physical deconditioning; Balance problem  THERAPY DIAG:  Muscle weakness (generalized)  Unsteadiness on feet  Rationale for Evaluation and Treatment Rehabilitation  ONSET DATE: over 1 year ago  SUBJECTIVE:   SUBJECTIVE STATEMENT: Reports that he feels imbalanced at times and has more problems when standing after sitting for a period of time.  PERTINENT HISTORY: Chronic right knee pain   PAIN:  Are you having pain? Yes: Pain location: right hamstring Aggravating factors: sitting for prolonged periods  PRECAUTIONS: None  WEIGHT  BEARING RESTRICTIONS No  FALLS:  Has patient fallen in last 6 months? No  OBJECTIVE:   TODAY'S TREATMENT:                                    EXERCISE LOG  Exercise Repetitions and Resistance Comments  Nustep L3 x15 min   Knee flexion 30# 3x10 reps   Knee extension 10# 3x10 reps   Airex standing DLS EO x1 min, EC x2 min No UE supprt  Tandem on beam X2 min Min to no UE support  Sidestepping on beam  Red band x5 RT   Marching to SLS on airex X2 min   Sit to stand with airex X5 reps Required UE support for sit <> stands  Shoulder ext on airex Blue XTS; x20 reps   B shoulder D2 on airex Blue XTS x20 reps    Blank cell = exercise not performed today     PATIENT EDUCATION:  Education details: POC, prognosis  Person educated: Patient Education method: Explanation Education comprehension: verbalized understanding   HOME EXERCISE PROGRAM:   ASSESSMENT:  CLINICAL IMPRESSION: Patient is a 88 y.o. male who was seen today for physical therapy evaluation and treatment for deconditioning and unsteadiness on his feet. He is at an elevated fall risk as evidenced by his balance assessment and five time sit to stand speed. Recommend that he continue with skilled physical therapy to address his remaining impairments to maximize his safety and functional mobility.    OBJECTIVE IMPAIRMENTS Abnormal gait, decreased activity tolerance, decreased balance, decreased mobility, difficulty walking, decreased strength, and impaired sensation.   ACTIVITY LIMITATIONS carrying, lifting, bending, standing, stairs, transfers, and locomotion level  PARTICIPATION LIMITATIONS: community activity and yard work  PERSONAL FACTORS Age, Fitness, Time since onset of injury/illness/exacerbation, and 3+ comorbidities: CHF, a-fib, HTN, OA, and history of TIA  are also affecting patient's functional outcome.   REHAB POTENTIAL: Fair    CLINICAL DECISION MAKING: Evolving/moderate complexity    GOALS: Goals reviewed with patient? Yes  LONG TERM GOALS: Target date: 08/07/2022   Patient will be independent with his HEP.  Baseline:  Goal status: INITIAL  2.  Patient will improve his five time sit to stand time to 14 seconds or less for improved lower extremity power and safety.  Baseline:  Goal status: INITIAL  3.  Patient will be able to improve his tandem stance with his RLE leading to at least 15 seconds for improved safety with a narrow base of support.  Baseline:  Goal status: INITIAL  PLAN: PT FREQUENCY: 2x/week  PT DURATION: other: 5 weeks  PLANNED INTERVENTIONS: Therapeutic exercises, Therapeutic activity, Neuromuscular re-education, Balance  training, Gait training, Patient/Family education, Self Care, Stair training, and Re-evaluation  PLAN FOR NEXT SESSION: nustep, lower extremity strengthening and balance interventions    Standley Brooking, PTA 07/10/2022, 1:54 PM

## 2022-07-13 ENCOUNTER — Ambulatory Visit: Payer: Medicare Other | Admitting: *Deleted

## 2022-07-13 ENCOUNTER — Encounter: Payer: Self-pay | Admitting: *Deleted

## 2022-07-13 DIAGNOSIS — R5381 Other malaise: Secondary | ICD-10-CM | POA: Diagnosis not present

## 2022-07-13 DIAGNOSIS — R2681 Unsteadiness on feet: Secondary | ICD-10-CM | POA: Diagnosis not present

## 2022-07-13 DIAGNOSIS — M6281 Muscle weakness (generalized): Secondary | ICD-10-CM | POA: Diagnosis not present

## 2022-07-13 DIAGNOSIS — R2689 Other abnormalities of gait and mobility: Secondary | ICD-10-CM | POA: Diagnosis not present

## 2022-07-13 NOTE — Therapy (Signed)
OUTPATIENT PHYSICAL THERAPY LOWER EXTREMITY TREATMENT   Patient Name: Dalton Vaughan MRN: [id number] DOB:[birth year], 88 y.o., male Today's Date: 07/13/2022   PT End of Session - 07/13/22 1037     Visit Number 3    Number of Visits 8    Date for PT Re-Evaluation 09/14/22    PT Start Time 1030    PT Stop Time 1114    PT Time Calculation (min) 44 min             Past Medical History:  Diagnosis Date   Anal fissure    Arthritis    Atrial fibrillation (HCC)    BPH (benign prostatic hypertrophy)    CAD (coronary artery disease)    Dr. Wynonia Lawman   Cataract    COVID-19    Depression 2004   Diverticulosis    GERD (gastroesophageal reflux disease)    History of TIAs    Hyperlipidemia    Hypertension    Hypertensive heart disease without CHF    Hypothyroidism    Internal hemorrhoids    Lumbar disc disease    Oral bleeding 08/20/2012   Following molar tooth extraction July, 2013   Pneumonia    Ruptured disk 1995   Shingles    Thyroid disease    Vitamin D deficiency    Vitreous detachment    Past Surgical History:  Procedure Laterality Date   bilateral cataract surgery  6/07   CARDIAC CATHETERIZATION     CORONARY ARTERY BYPASS GRAFT  12/99    Dr. Servando Snare    cysloscopy  8/08   [place]   right   herninated disc  [place]  11/03/2020   PROSTATE BIOPSY  8/08   TONSILLECTOMY AND ADENOIDECTOMY     Patient Active Problem List   Diagnosis Date Noted   Purpura senilis ([place]) 06/27/2022   Left lower quadrant abdominal pain 11/08/2021   Diverticulitis 08/08/2021   Pacemaker 11/03/2020   Radiculopathy, lumbar region 01/25/2020   Hypertension 01/25/2020   Acute respiratory failure with hypoxia ([place]) 09/16/2019   Osteoarthritis of spine with radiculopathy, lumbar region 01/08/2018   Thyroid disease    Aortic atherosclerosis ([place]) 05/02/2017   Thrombocytopenia ([place]) 07/10/2016   Cervical spondylosis without myelopathy 04/25/2016    Hereditary and idiopathic peripheral neuropathy 10/16/2015   Polyneuropathy 10/04/2015   Allergic rhinitis 05/06/2014   Depression 08/03/2013   Sick sinus syndrome ([place]) 07/31/2012   Hypertensive heart disease without CHF    Chronic atrial fibrillation (HCC)    Coronary artery disease involving native coronary artery of native heart with angina pectoris (HCC)    History of TIAs    GERD (gastroesophageal reflux disease)    Chronic anticoagulation    Benign prostatic hyperplasia    Hyperlipidemia     REFERRING PROVIDER: [name], DO  REFERRING DIAG: Physical deconditioning; Balance problem  THERAPY DIAG:  Muscle weakness (generalized)  Unsteadiness on feet  Rationale for Evaluation and Treatment Rehabilitation  ONSET DATE: over 1 year ago  SUBJECTIVE:   SUBJECTIVE STATEMENT: Doing about the same. He feels imbalanced at times and has more problems when standing after sitting for a period of time.  PERTINENT HISTORY: Chronic right knee pain   PAIN:  Are you having pain? Yes: Pain location: right hamstring Aggravating factors: sitting for prolonged periods  PRECAUTIONS: None  WEIGHT BEARING RESTRICTIONS No  FALLS:  Has patient fallen in last 6 months? No  OBJECTIVE:  TODAY'S TREATMENT:                                    EXERCISE LOG   07-13-22  Exercise Repetitions and Resistance Comments  Nustep L3 x15 min   Knee flexion 30# 3x15reps   Knee extension 10# 3x15 reps   Airex standing DLS EO x1 min, EC x2 min No UE supprt  Tandem on beam X2 min, walking forward and backward x 2 mins Min to no UE support  Rocker board  4 mins   Sidestepping on beam  X 3 mins   Marching to SLS on airex X2 min   SLS   X3 on each LE   Sit to stand with airex  Required UE support for sit <> stands  Shoulder ext on airex    B shoulder D2 on airex     Blank cell = exercise not performed today    PATIENT EDUCATION:  Education details: POC, prognosis  Person  educated: Patient Education method: Explanation Education comprehension: verbalized understanding   HOME EXERCISE PROGRAM:   ASSESSMENT:  CLINICAL IMPRESSION:    Pt arrived today doing fairly well and was able to continue with LE strengthening exs as well as balance act.'s. Pt able to progress with increased reps on LE machines and felt better with balance act.'s     OBJECTIVE IMPAIRMENTS Abnormal gait, decreased activity tolerance, decreased balance, decreased mobility, difficulty walking, decreased strength, and impaired sensation.   ACTIVITY LIMITATIONS carrying, lifting, bending, standing, stairs, transfers, and locomotion level  PARTICIPATION LIMITATIONS: community activity and yard work  PERSONAL FACTORS Age, Fitness, Time since onset of injury/illness/exacerbation, and 3+ comorbidities: CHF, a-fib, HTN, OA, and history of TIA  are also affecting patient's functional outcome.   REHAB POTENTIAL: Fair    CLINICAL DECISION MAKING: Evolving/moderate complexity    GOALS: Goals reviewed with patient? Yes  LONG TERM GOALS: Target date: 08/10/2022   Patient will be independent with his HEP.  Baseline:  Goal status: INITIAL  2.  Patient will improve his five time sit to stand time to 14 seconds or less for improved lower extremity power and safety.  Baseline:  Goal status: INITIAL  3.  Patient will be able to improve his tandem stance with his RLE leading to at least 15 seconds for improved safety with a narrow base of support.  Baseline:  Goal status: INITIAL  PLAN: PT FREQUENCY: 2x/week  PT DURATION: other: 5 weeks  PLANNED INTERVENTIONS: Therapeutic exercises, Therapeutic activity, Neuromuscular re-education, Balance training, Gait training, Patient/Family education, Self Care, Stair training, and Re-evaluation  PLAN FOR NEXT SESSION: nustep, lower extremity strengthening and balance interventions    Goodwin [place],CHRIS, PTA 07/13/2022, 12:22 PM

## 2022-07-17 ENCOUNTER — Encounter: Payer: Self-pay | Admitting: Nurse Practitioner

## 2022-07-17 ENCOUNTER — Ambulatory Visit (INDEPENDENT_AMBULATORY_CARE_PROVIDER_SITE_OTHER): Payer: Medicare Other | Admitting: Nurse Practitioner

## 2022-07-17 ENCOUNTER — Ambulatory Visit: Payer: Medicare Other | Attending: Family Medicine

## 2022-07-17 VITALS — BP 135/82 | HR 89 | Temp 98.6°F | Ht 72.0 in | Wt 168.0 lb

## 2022-07-17 DIAGNOSIS — R2681 Unsteadiness on feet: Secondary | ICD-10-CM | POA: Diagnosis not present

## 2022-07-17 DIAGNOSIS — R300 Dysuria: Secondary | ICD-10-CM | POA: Diagnosis not present

## 2022-07-17 DIAGNOSIS — M6281 Muscle weakness (generalized): Secondary | ICD-10-CM | POA: Diagnosis not present

## 2022-07-17 LAB — URINALYSIS, COMPLETE
Bilirubin, UA: NEGATIVE
Glucose, UA: NEGATIVE
Ketones, UA: NEGATIVE
Leukocytes,UA: NEGATIVE
Nitrite, UA: NEGATIVE
Protein,UA: NEGATIVE
RBC, UA: NEGATIVE
Specific Gravity, UA: 1.01 (ref 1.005–1.030)
Urobilinogen, Ur: 0.2 mg/dL (ref 0.2–1.0)
pH, UA: 7 (ref 5.0–7.5)

## 2022-07-17 LAB — MICROSCOPIC EXAMINATION
Bacteria, UA: NONE SEEN
Epithelial Cells (non renal): NONE SEEN /hpf (ref 0–10)
RBC, Urine: NONE SEEN /hpf (ref 0–2)
Renal Epithel, UA: NONE SEEN /hpf
WBC, UA: NONE SEEN /hpf (ref 0–5)

## 2022-07-17 NOTE — Therapy (Addendum)
OUTPATIENT PHYSICAL THERAPY LOWER EXTREMITY TREATMENT   Patient Name: Dalton Vaughan MRN: [id number] DOB:1929-01-20, 88 y.o., male Today's Date: 07/17/2022   PT End of Session - 07/17/22 1113     Visit Number 4    Number of Visits 8    Date for PT Re-Evaluation 09/14/22    PT Start Time 1115    PT Stop Time 1200    PT Time Calculation (min) 45 min    Activity Tolerance Patient tolerated treatment well    Behavior During Therapy [facility] for tasks assessed/performed             Past Medical History:  Diagnosis Date   Anal fissure    Arthritis    Atrial fibrillation ([place])    BPH (benign prostatic hypertrophy)    CAD (coronary artery disease)    Dr. Wynonia Lawman   Cataract    COVID-19    Depression 2004   Diverticulosis    GERD (gastroesophageal reflux disease)    History of TIAs    Hyperlipidemia    Hypertension    Hypertensive heart disease without CHF    Hypothyroidism    Internal hemorrhoids    Lumbar disc disease    Oral bleeding 08/20/2012   Following molar tooth extraction July, 2013   Pneumonia    Ruptured disk 1995   Shingles    Thyroid disease    Vitamin D deficiency    Vitreous detachment    Past Surgical History:  Procedure Laterality Date   bilateral cataract surgery  6/07   CARDIAC CATHETERIZATION     CORONARY ARTERY BYPASS GRAFT  12/99    Dr. Servando Snare    cysloscopy  8/08   [place]   right   herninated disc  [place]  11/03/2020   PROSTATE BIOPSY  8/08   TONSILLECTOMY AND ADENOIDECTOMY     Patient Active Problem List   Diagnosis Date Noted   Purpura senilis ([place]) 06/27/2022   Left lower quadrant abdominal pain 11/08/2021   Diverticulitis 08/08/2021   Pacemaker 11/03/2020   Radiculopathy, lumbar region 01/25/2020   Hypertension 01/25/2020   Acute respiratory failure with hypoxia ([place]) 09/16/2019   Osteoarthritis of spine with radiculopathy, lumbar region 01/08/2018   Thyroid disease    Aortic  atherosclerosis ([place]) 05/02/2017   Thrombocytopenia ([place]) 07/10/2016   Cervical spondylosis without myelopathy 04/25/2016   Hereditary and idiopathic peripheral neuropathy 10/16/2015   Polyneuropathy 10/04/2015   Allergic rhinitis 05/06/2014   Depression 08/03/2013   Sick sinus syndrome ([place]) 07/31/2012   Hypertensive heart disease without CHF    Chronic atrial fibrillation (HCC)    Coronary artery disease involving native coronary artery of native heart with angina pectoris (HCC)    History of TIAs    GERD (gastroesophageal reflux disease)    Chronic anticoagulation    Benign prostatic hyperplasia    Hyperlipidemia     REFERRING PROVIDER: [name], DO  REFERRING DIAG: Physical deconditioning; Balance problem  THERAPY DIAG:  Muscle weakness (generalized)  Unsteadiness on feet  Rationale for Evaluation and Treatment Rehabilitation  ONSET DATE: over 1 year ago  SUBJECTIVE:   SUBJECTIVE STATEMENT: Patient reports that he feels alright today.   PERTINENT HISTORY: Chronic right knee pain   PAIN:  Are you having pain? Yes: Pain location: right hamstring Aggravating factors: sitting for prolonged periods  PRECAUTIONS: None  WEIGHT BEARING RESTRICTIONS No  FALLS:  Has patient fallen in last 6  months? No  OBJECTIVE:   TODAY'S TREATMENT:                                   8/1 EXERCISE LOG  Exercise Repetitions and Resistance Comments  Nustep L4 x 15 minutes   Resisted knee extension 20# x 33 reps    Resisted knee flexion  40# x 33 reps   Lateral step up and over 6" step x 2 minutes   Rocker board  4 minutes with fingertip support   Marching on BOSU 2 minutes    Leg press 3 plates; seat 7; 30 reps   Shoulder extension on airex Blue XTS x 20 reps each   SLS on foam  2 x 30" each with fingertip support     Blank cell = exercise not performed today                                     EXERCISE LOG   07-13-22  Exercise Repetitions and Resistance Comments   Nustep L3 x15 min   Knee flexion 30# 3x15reps   Knee extension 10# 3x15 reps   Airex standing DLS EO x1 min, EC x2 min No UE supprt  Tandem on beam X2 min, walking forward and backward x 2 mins Min to no UE support  Rocker board  4 mins   Sidestepping on beam  X 3 mins   Marching to SLS on airex X2 min   SLS   X3 on each LE   Sit to stand with airex  Required UE support for sit <> stands  Shoulder ext on airex    B shoulder D2 on airex     Blank cell = exercise not performed today    PATIENT EDUCATION:  Education details: POC, prognosis  Person educated: Patient Education method: Explanation Education comprehension: verbalized understanding   HOME EXERCISE PROGRAM:   ASSESSMENT:  CLINICAL IMPRESSION:  Patient was progressed with multiple new and familiar interventions for improved lower extremity strength and stability. He required minimal cueing with with resisted shoulder extension on the airex for improved trunk stability and upright posture to avoid looking at his feet. He reported feeling slightly fatigued upon the conclusion of treatment. He continues to require skilled physical therapy to address his remaining impairments to maximize his safety and functional mobility.   PHYSICAL THERAPY DISCHARGE SUMMARY  Visits from Start of Care: 4  Current functional level related to goals / functional outcomes: Patient was unable to meet any of his goals for skilled physical therapy.    Remaining deficits: Lower extremity stability and power    Education / Equipment: HEP    Patient agrees to discharge. Patient goals were not met. Patient is being discharged due to not returning since the last visit.   OBJECTIVE IMPAIRMENTS Abnormal gait, decreased activity tolerance, decreased balance, decreased mobility, difficulty walking, decreased strength, and impaired sensation.   ACTIVITY LIMITATIONS carrying, lifting, bending, standing, stairs, transfers, and locomotion  level  PARTICIPATION LIMITATIONS: community activity and yard work  PERSONAL FACTORS Age, Fitness, Time since onset of injury/illness/exacerbation, and 3+ comorbidities: CHF, a-fib, HTN, OA, and history of TIA  are also affecting patient's functional outcome.   REHAB POTENTIAL: Fair    CLINICAL DECISION MAKING: Evolving/moderate complexity    GOALS: Goals reviewed with patient? Yes  LONG TERM GOALS: Target  date: 08/14/2022   Patient will be independent with his HEP.  Baseline:  Goal status: INITIAL  2.  Patient will improve his five time sit to stand time to 14 seconds or less for improved lower extremity power and safety.  Baseline:  Goal status: INITIAL  3.  Patient will be able to improve his tandem stance with his RLE leading to at least 15 seconds for improved safety with a narrow base of support.  Baseline:  Goal status: INITIAL  PLAN: PT FREQUENCY: 2x/week  PT DURATION: other: 5 weeks  PLANNED INTERVENTIONS: Therapeutic exercises, Therapeutic activity, Neuromuscular re-education, Balance training, Gait training, Patient/Family education, Self Care, Stair training, and Re-evaluation  PLAN FOR NEXT SESSION: nustep, lower extremity strengthening and balance interventions    Darlin Coco, PT 07/17/2022, 12:15 PM

## 2022-07-17 NOTE — Progress Notes (Signed)
   Acute Office Visit  Subjective:     Patient ID: Dalton Vaughan, male    DOB: 1929-02-12, 88 y.o.   MRN: [id number]  Chief Complaint  Patient presents with   Dysuria    Dysuria  This is a new problem. The current episode started yesterday. The problem occurs every urination. The problem has been unchanged. The quality of the pain is described as burning and aching. The pain is at a severity of 4/10. There has been no fever. Associated symptoms include frequency and urgency. Pertinent negatives include no chills, flank pain, hematuria, nausea or sweats. He has tried nothing for the symptoms.    Review of Systems  Constitutional: Negative.  Negative for chills.  HENT: Negative.    Cardiovascular: Negative.   Gastrointestinal:  Negative for nausea.  Genitourinary:  Positive for dysuria, frequency and urgency. Negative for flank pain and hematuria.  Skin: Negative.  Negative for rash.  All other systems reviewed and are negative.       Objective:    BP 135/82   Pulse 89   Temp 98.6 F (37 C)   Ht 6' (1.829 m)   Wt 168 lb (76.2 kg)   SpO2 98%   BMI 22.78 kg/m  BP Readings from Last 3 Encounters:  07/17/22 135/82  06/27/22 (!) 143/74  05/08/22 136/75   Wt Readings from Last 3 Encounters:  07/17/22 168 lb (76.2 kg)  06/27/22 172 lb 9.6 oz (78.3 kg)  05/08/22 173 lb 3.2 oz (78.6 kg)      Physical Exam Vitals and nursing note reviewed.  Constitutional:      Appearance: Normal appearance.  HENT:     Head: Normocephalic.     Right Ear: External ear normal.     Left Ear: External ear normal.     Nose: Nose normal.     Mouth/Throat:     Mouth: Mucous membranes are moist.     Pharynx: Oropharynx is clear.  Eyes:     Conjunctiva/sclera: Conjunctivae normal.  Cardiovascular:     Pulses: Normal pulses.     Heart sounds: Normal heart sounds.  Pulmonary:     Effort: Pulmonary effort is normal.     Breath sounds: Normal breath sounds.  Abdominal:     General: Bowel  sounds are normal.     Tenderness: There is abdominal tenderness. There is no right CVA tenderness or left CVA tenderness.  Skin:    General: Skin is warm.     Findings: No erythema or rash.  Neurological:     Mental Status: He is alert.     No results found for any visits on 07/17/22.      Assessment & Plan:   UTI  vs  interstitial cystitis -urinalysis completed results pending -pyridium for pain Precaution and education provided -All questions answered -follow up with unresolved symptoms   Problem List Items Addressed This Visit   None Visit Diagnoses     Dysuria    -  Primary   Relevant Orders   Urine Culture   Urinalysis, Complete       No orders of the defined types were placed in this encounter.   Return if symptoms worsen or fail to improve.  Ivy Lynn, NP

## 2022-07-17 NOTE — Patient Instructions (Signed)
Dysuria Dysuria is pain or discomfort during urination. The pain or discomfort may be felt in the part of the body that drains urine from the bladder (urethra) or in the surrounding tissue of the genitals. The pain may also be felt in the groin area, lower abdomen, or lower back. You may have to urinate frequently or have the sudden feeling that you have to urinate (urgency). Dysuria can affect anyone, but it is more common in females. Dysuria can be caused by many different things, including: Urinary tract infection. Kidney stones or bladder stones. Certain STIs (sexually transmitted infections), such as chlamydia. Dehydration. Inflammation of the tissues of the vagina. Use of certain medicines. Use of certain soaps or scented products that cause irritation. Follow these instructions at home: Medicines Take over-the-counter and prescription medicines only as told by your health care provider. If you were prescribed an antibiotic medicine, take it as told by your health care provider. Do not stop taking the antibiotic even if you start to feel better. Eating and drinking  Drink enough fluid to keep your urine pale yellow. Avoid caffeinated beverages, tea, and alcohol. These beverages can irritate the bladder and make dysuria worse. In males, alcohol may irritate the prostate. General instructions Watch your condition for any changes. Urinate often. Avoid holding urine for long periods of time. If you are male, you should wipe from front to back after urinating or having a bowel movement. Use each piece of toilet paper only once. Empty your bladder after sex. Keep all follow-up visits. This is important. If you had any tests done to find the cause of dysuria, it is up to you to get your test results. Ask your health care provider, or the department that is doing the test, when your results will be ready. Contact a health care provider if: You have a fever. You develop pain in your back or  sides. You have nausea or vomiting. You have blood in your urine. You are not urinating as often as you usually do. Get help right away if: Your pain is severe and not relieved with medicines. You cannot eat or drink without vomiting. You are confused. You have a rapid heartbeat while resting. You have shaking or chills. You feel extremely weak. Summary Dysuria is pain or discomfort while urinating. Many different conditions can lead to dysuria. If you have dysuria, you may have to urinate frequently or have the sudden feeling that you have to urinate (urgency). Watch your condition for any changes. Keep all follow-up visits. Make sure that you urinate often and drink enough fluid to keep your urine pale yellow. This information is not intended to replace advice given to you by your health care provider. Make sure you discuss any questions you have with your health care provider. Document Revised: 07/15/2020 Document Reviewed: 07/15/2020 Elsevier Patient Education  [place].

## 2022-07-18 ENCOUNTER — Other Ambulatory Visit: Payer: Self-pay | Admitting: Nurse Practitioner

## 2022-07-18 DIAGNOSIS — R300 Dysuria: Secondary | ICD-10-CM

## 2022-07-19 LAB — URINE CULTURE: Organism ID, Bacteria: NO GROWTH

## 2022-07-24 ENCOUNTER — Encounter: Payer: TRICARE For Life (TFL) | Admitting: *Deleted

## 2022-07-26 ENCOUNTER — Encounter: Payer: TRICARE For Life (TFL) | Admitting: *Deleted

## 2022-07-27 ENCOUNTER — Ambulatory Visit: Payer: TRICARE For Life (TFL) | Admitting: Urology

## 2022-07-27 DIAGNOSIS — R300 Dysuria: Secondary | ICD-10-CM | POA: Diagnosis not present

## 2022-07-27 DIAGNOSIS — R102 Pelvic and perineal pain: Secondary | ICD-10-CM | POA: Diagnosis not present

## 2022-08-07 ENCOUNTER — Ambulatory Visit (INDEPENDENT_AMBULATORY_CARE_PROVIDER_SITE_OTHER): Payer: Medicare Other | Admitting: Family Medicine

## 2022-08-07 ENCOUNTER — Encounter: Payer: Self-pay | Admitting: Family Medicine

## 2022-08-07 VITALS — BP 155/88 | HR 84 | Temp 98.0°F | Ht 73.0 in | Wt 169.6 lb

## 2022-08-07 DIAGNOSIS — E079 Disorder of thyroid, unspecified: Secondary | ICD-10-CM | POA: Diagnosis not present

## 2022-08-07 DIAGNOSIS — I482 Chronic atrial fibrillation, unspecified: Secondary | ICD-10-CM | POA: Diagnosis not present

## 2022-08-07 DIAGNOSIS — N3281 Overactive bladder: Secondary | ICD-10-CM | POA: Diagnosis not present

## 2022-08-07 DIAGNOSIS — Z7901 Long term (current) use of anticoagulants: Secondary | ICD-10-CM

## 2022-08-07 DIAGNOSIS — R351 Nocturia: Secondary | ICD-10-CM

## 2022-08-07 LAB — COAGUCHEK XS/INR WAIVED
INR: 2.6 — ABNORMAL HIGH (ref 0.9–1.1)
Prothrombin Time: 31.1 s

## 2022-08-07 MED ORDER — DOXAZOSIN MESYLATE 8 MG PO TABS: 8.0000 mg | ORAL_TABLET | Freq: Every day | ORAL | 0 refills | Status: DC

## 2022-08-07 MED ORDER — TRIAMTERENE-HCTZ 37.5-25 MG PO TABS: 1.0000 | ORAL_TABLET | Freq: Every day | ORAL | 0 refills | Status: DC

## 2022-08-07 MED ORDER — MIRABEGRON ER 25 MG PO TB24: 25.0000 mg | ORAL_TABLET | Freq: Every day | ORAL | 0 refills | Status: DC

## 2022-08-07 NOTE — Patient Instructions (Signed)
Myrbetriq ONCE daily for overactive bladder.

## 2022-08-07 NOTE — Progress Notes (Signed)
Subjective: CC:INR chec, thyroid PCP: [name], DO HPI:Dalton Vaughan is a 88 y.o. male presenting to clinic today for:  1. Afib Goal INR 2-3.  Reports compliance with his Coumadin.  No bleeding reported.  No heart palpitations or tachycardia.  Reports some fatigue.  Though he admits that he wakes up 3-4 times per night to urinate.  He does hydrate well.  He has mentioned this to his urologist before but notes that no medications have been adjusted.  Currently treated with doxazosin.  2. Hypothyroidism Compliant with thyroid replacement medicine.  No reports of tremor, heart palpitations or difficulty swallowing.  Reports some fatigue as above   ROS: Per HPI  Allergies  Allergen Reactions   Paxil [Paroxetine Hcl] Palpitations   Codeine Other (See Comments)   Eliquis [Apixaban] Other (See Comments)    dizziness   Penicillins Other (See Comments)    Did it involve swelling of the face/tongue/throat, SOB, or low BP? Unknown Did it involve sudden or severe rash/hives, skin peeling, or any reaction on the inside of your mouth or nose? Unknown Did you need to seek medical attention at a hospital or doctor's office? Unknown When did it last happen?      unk If all above answers are "NO", may proceed with cephalosporin use.    Pravachol [Pravastatin Sodium] Other (See Comments)    myalgias   Zetia [Ezetimibe] Other (See Comments)    myalgias   Vytorin [Ezetimibe-Simvastatin] Other (See Comments)    myalgias   Past Medical History:  Diagnosis Date   Anal fissure    Arthritis    Atrial fibrillation (HCC)    BPH (benign prostatic hypertrophy)    CAD (coronary artery disease)    Dr. Wynonia Lawman   Cataract    COVID-19    Depression 2004   Diverticulosis    GERD (gastroesophageal reflux disease)    History of TIAs    Hyperlipidemia    Hypertension    Hypertensive heart disease without CHF    Hypothyroidism    Internal hemorrhoids    Lumbar disc disease    Oral bleeding  08/20/2012   Following molar tooth extraction July, 2013   Pneumonia    Ruptured disk 1995   Shingles    Thyroid disease    Vitamin D deficiency    Vitreous detachment     Current Outpatient Medications:    albuterol (VENTOLIN HFA) 108 (90 Base) MCG/ACT inhaler, Inhale 2 puffs into the lungs every 4 (four) hours as needed., Disp: , Rfl:    amLODipine (NORVASC) 5 MG tablet, TAKE 1 TABLET DAILY, Disp: 90 tablet, Rfl: 1   Calcium Carb-Cholecalciferol (CALCIUM + D3) 600-200 MG-UNIT TABS, Take 1 tablet by mouth daily. , Disp: , Rfl:    cetirizine (ZYRTEC ALLERGY) 10 MG tablet, Take 0.5 tablets (5 mg total) by mouth at bedtime as needed for allergies (drainage)., Disp: 90 tablet, Rfl: 3   Cholecalciferol (VITAMIN D-3) 5000 UNITS TABS, Take 1 tablet by mouth daily., Disp: , Rfl:    doxazosin (CARDURA) 8 MG tablet, TAKE 1 TABLET AT BEDTIME, Disp: 90 tablet, Rfl: 3   lidocaine (XYLOCAINE) 5 % ointment, Apply 1 application topically as needed., Disp: 35.44 g, Rfl: 0   mirtazapine (REMERON) 15 MG tablet, TAKE 1 TABLET AT BEDTIME, Disp: 90 tablet, Rfl: 3   Multiple Vitamin (MULTIVITAMIN) tablet, Take 1 tablet by mouth daily., Disp: , Rfl:    ramipril (ALTACE) 10 MG capsule, TAKE 1 CAPSULE DAILY, Disp: 90  capsule, Rfl: 3   SIMETHICONE PO, Take by mouth as needed., Disp: , Rfl:    SYNTHROID 50 MCG tablet, TAKE 1 TABLET DAILY BEFORE BREAKFAST, Disp: 90 tablet, Rfl: 3   triamterene-hydrochlorothiazide (MAXZIDE-25) 37.5-25 MG tablet, TAKE 1 TABLET DAILY, Disp: 90 tablet, Rfl: 0   vitamin C (ASCORBIC ACID) 500 MG tablet, Take 500 mg by mouth daily. , Disp: , Rfl:    warfarin (COUMADIN) 5 MG tablet, TAKE 1 TABLET DAILY AS DIRECTED, Disp: 90 tablet, Rfl: 0   XIIDRA 5 % SOLN, Apply 1 drop to eye as needed. 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  Tobacco Use   Smoking status: Former    Packs/day: 1.00    Years: 18.00    Total pack years: 18.00    Types: Cigarettes    Start date: 12/17/1948    Quit date: 12/17/1966    Years since quitting: 55.6   Smokeless tobacco: Never  Vaping Use   Vaping Use: Never used  Substance and Sexual Activity   Alcohol use: Yes    Alcohol/week: 1.0 standard drink of alcohol    Types: 1 Standard drinks or equivalent per week    Comment: rare   Drug use: No   Sexual activity: Not on file  Other Topics Concern   Not on file  Social History Narrative   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, music at [employer]      Right-handed      Caffeine use: none      Son and grandson live with him   Social Determinants of Health   Financial Resource Strain: Low Risk  (09/05/2021)   Overall Financial Resource Strain (CARDIA)    Difficulty of Paying Living Expenses: Not hard at all  Food Insecurity: No Food Insecurity (09/05/2021)   Hunger Vital Sign    Worried About Running Out of Food in the Last Year: Never true    [place] in the Last Year: Never true  Transportation Needs: No Transportation Needs (09/05/2021)   PRAPARE - Hydrologist (Medical): No    Lack of Transportation (Non-Medical): No  Physical Activity: Sufficiently Active (09/05/2021)   Exercise Vital Sign    Days of Exercise per Week: 5 days    Minutes of Exercise per Session: 50 min  Stress: No Stress Concern Present (09/05/2021)   [place]    Feeling of Stress : Only a little  Social Connections: Moderately Isolated (09/05/2021)   Social Connection and Isolation Panel [NHANES]    Frequency of Communication with Friends and Family: More than three times a week     Frequency of Social Gatherings with Friends and Family: More than three times a week    Attends Religious Services: Never    Marine scientist or Organizations: Yes    Attends Archivist Meetings: 1 to 4 times per year    Marital Status: Widowed  Intimate Partner Violence: Not At Risk (09/05/2021)   Humiliation, Afraid, Rape, and Kick  questionnaire    Fear of Current or Ex-Partner: No    Emotionally Abused: No    Physically Abused: No    Sexually Abused: No   Family History  Problem Relation Age of Onset   Dementia Mother    Arthritis Mother    Cancer Brother        prostate   Asthma Sister    Heart disease Paternal Aunt    Heart disease Paternal Uncle    Hypertension Maternal Grandmother    CVA Maternal Grandmother    CVA Paternal Grandmother    Hepatitis C Son    Arthritis Son    Colon cancer Neg Hx    Colon polyps Neg Hx    Kidney disease Neg Hx    Esophageal cancer Neg Hx    Gallbladder disease Neg Hx    Diabetes Neg Hx     Objective: Office vital signs reviewed. BP (!) 155/88   Pulse 84   Temp 98 F (36.7 C) (Temporal)   Ht 6' 1"  (1.854 m)   Wt 169 lb 9.6 oz (76.9 kg)   SpO2 96%   BMI 22.38 kg/m   Physical Examination:  General: Awake, alert, well nourished, No acute distress HEENT: No exophthalmos.  No goiter Cardio: regular rate and rhythm, S1S2 heard, no murmurs appreciated Pulm: clear to auscultation bilaterally, no wheezes, rhonchi or rales; normal work of breathing on room air Extremities: warm, well perfused, No edema, cyanosis or clubbing; +2 pulses bilaterally MSK: Ambulating independently  Assessment/ Plan: 88 y.o. male   Chronic atrial fibrillation ([place]) - Plan: CoaguChek XS/INR Waived  Chronic anticoagulation - Plan: CoaguChek XS/INR Waived  Thyroid disease - Plan: TSH, T4, Free  Nocturia - Plan: mirabegron ER (MYRBETRIQ) 25 MG TB24 tablet  Overactive bladder - Plan: mirabegron ER (MYRBETRIQ) 25 MG TB24 tablet  Rate and  rhythm controlled today.  INR therapeutic at 2.9.  No changes  Having some fatigue to check TSH, free T4.  Uncertain if the fatigue is secondary to nocturia.  We discussed that nocturia may be secondary to prostate versus overactive bladder.  We will trial 6 weeks of Myrbetriq which I gave him samples up today.  We will CC to urology for any other recommendations  No orders of the defined types were placed in this encounter.  No orders of the defined types were placed in this encounter.    [name], DO [place] [phone]

## 2022-08-07 NOTE — Addendum Note (Signed)
Addended by: Karle Plumber on: 08/07/2022 03:34 PM   Modules accepted: Orders

## 2022-08-08 LAB — TSH: TSH: 2.54 u[IU]/mL (ref 0.450–4.500)

## 2022-08-08 LAB — T4, FREE: Free T4: 1.36 ng/dL (ref 0.82–1.77)

## 2022-08-09 DIAGNOSIS — L84 Corns and callosities: Secondary | ICD-10-CM | POA: Diagnosis not present

## 2022-08-09 DIAGNOSIS — M79676 Pain in unspecified toe(s): Secondary | ICD-10-CM | POA: Diagnosis not present

## 2022-08-09 DIAGNOSIS — B351 Tinea unguium: Secondary | ICD-10-CM | POA: Diagnosis not present

## 2022-08-09 DIAGNOSIS — I70203 Unspecified atherosclerosis of native arteries of extremities, bilateral legs: Secondary | ICD-10-CM | POA: Diagnosis not present

## 2022-08-10 ENCOUNTER — Telehealth: Payer: Self-pay | Admitting: Family Medicine

## 2022-08-10 DIAGNOSIS — F321 Major depressive disorder, single episode, moderate: Secondary | ICD-10-CM

## 2022-08-10 MED ORDER — MIRTAZAPINE 15 MG PO TABS: 15.0000 mg | ORAL_TABLET | Freq: Every day | ORAL | 1 refills | Status: DC

## 2022-08-10 NOTE — Telephone Encounter (Signed)
Rx sent to pharmacy   

## 2022-08-10 NOTE — Telephone Encounter (Signed)
  Prescription Request  08/10/2022  Is this a "Controlled Substance" medicine? mirtazapine (REMERON) 15 MG tablet  Have you seen your PCP in the last 2 weeks? 08/07/2022  If YES, route message to pool  -  If NO, patient needs to be scheduled for appointment.  What is the name of the medication or equipment? mirtazapine (REMERON) 15 MG tablet  Have you contacted your pharmacy to request a refill? no   Which pharmacy would you like this sent to? [facility]   Patient notified that their request is being sent to the clinical staff for review and that they should receive a response within 2 business days.

## 2022-08-14 ENCOUNTER — Other Ambulatory Visit: Payer: Self-pay | Admitting: Family Medicine

## 2022-09-18 ENCOUNTER — Ambulatory Visit (INDEPENDENT_AMBULATORY_CARE_PROVIDER_SITE_OTHER): Payer: Medicare Other | Admitting: Family Medicine

## 2022-09-18 ENCOUNTER — Encounter: Payer: Self-pay | Admitting: Family Medicine

## 2022-09-18 VITALS — BP 139/77 | HR 76 | Temp 97.6°F | Ht 73.0 in | Wt 169.6 lb

## 2022-09-18 DIAGNOSIS — N3281 Overactive bladder: Secondary | ICD-10-CM | POA: Diagnosis not present

## 2022-09-18 DIAGNOSIS — A084 Viral intestinal infection, unspecified: Secondary | ICD-10-CM | POA: Diagnosis not present

## 2022-09-18 DIAGNOSIS — I10 Essential (primary) hypertension: Secondary | ICD-10-CM | POA: Diagnosis not present

## 2022-09-18 DIAGNOSIS — R351 Nocturia: Secondary | ICD-10-CM | POA: Diagnosis not present

## 2022-09-18 DIAGNOSIS — Z7901 Long term (current) use of anticoagulants: Secondary | ICD-10-CM

## 2022-09-18 DIAGNOSIS — I700 Atherosclerosis of aorta: Secondary | ICD-10-CM

## 2022-09-18 DIAGNOSIS — R791 Abnormal coagulation profile: Secondary | ICD-10-CM | POA: Diagnosis not present

## 2022-09-18 LAB — COAGUCHEK XS/INR WAIVED
INR: 3.1 — ABNORMAL HIGH (ref 0.9–1.1)
Prothrombin Time: 36.8 s

## 2022-09-18 MED ORDER — ROSUVASTATIN CALCIUM 5 MG PO TABS: ORAL_TABLET | ORAL | 3 refills | Status: DC

## 2022-09-18 MED ORDER — AMLODIPINE BESYLATE 5 MG PO TABS: 5.0000 mg | ORAL_TABLET | Freq: Every day | ORAL | 3 refills | Status: DC

## 2022-09-18 MED ORDER — ONDANSETRON 4 MG PO TBDP: 4.0000 mg | ORAL_TABLET | Freq: Three times a day (TID) | ORAL | 0 refills | Status: DC | PRN

## 2022-09-18 MED ORDER — DOXAZOSIN MESYLATE 8 MG PO TABS
8.0000 mg | ORAL_TABLET | Freq: Every day | ORAL | 3 refills | Status: DC
Start: 2022-09-18 — End: 2023-11-25

## 2022-09-18 MED ORDER — TRIAMTERENE-HCTZ 37.5-25 MG PO TABS
1.0000 | ORAL_TABLET | Freq: Every day | ORAL | 3 refills | Status: DC
Start: 2022-09-18 — End: 2023-11-25

## 2022-09-18 NOTE — Patient Instructions (Signed)
Food Choices to Help Relieve Diarrhea, Adult Diarrhea can make you feel weak and cause you to become dehydrated. It is important to choose the right foods and drinks to: Relieve diarrhea. Replace lost fluids and nutrients. Prevent dehydration. What are tips for following this plan? Relieving diarrhea Avoid foods that make your diarrhea worse. These may include: Foods and beverages sweetened with high-fructose corn syrup, honey, or sweeteners such as xylitol, sorbitol, and mannitol. Fried, greasy, or spicy foods. Raw fruits and vegetables. Eat foods that are rich in probiotics. These include foods such as yogurt and fermented milk products. Probiotics can help increase healthy bacteria in your stomach and intestines (gastrointestinal tract or GI tract). This may help digestion and stop diarrhea. If you have lactose intolerance, avoid dairy products. These may make your diarrhea worse. Take medicine to help stop diarrhea only as told by your health care provider. Replacing nutrients  Eat bland, easy-to-digest foods in small amounts as you are able, until your diarrhea starts to get better. These foods include bananas, applesauce, rice, toast, and crackers. Gradually reintroduce nutrient-rich foods as tolerated or as told by your health care provider. This includes: Well-cooked protein foods, such as eggs, lean meats like fish or chicken without skin, and tofu. Peeled, seeded, and soft-cooked fruits and vegetables. Low-fat dairy products. Whole grains. Take vitamin and mineral supplements as told by your health care provider. Preventing dehydration  Start by sipping water or a solution to prevent dehydration (oral rehydration solution, ORS). This is a drink that helps replace fluids and minerals your body has lost. You can buy an ORS at pharmacies and retail stores. Try to drink at least 8-10 cups (2,000-2,500 mL) of fluid each day to help replace lost fluids. If you have urine that is pale  yellow, you are getting enough fluids. You may drink other liquids in addition to water, such as fruit juice that you have added water to (diluted fruit juice) or low-calorie sports drinks, as tolerated or as told by your health care provider. Avoid drinks with caffeine, such as coffee, tea, or soft drinks. Avoid alcohol. Summary When you have diarrhea, it is important to choose the right foods and drinks to relieve diarrhea, to replace lost fluids and nutrients, and to prevent dehydration. Make sure you drink enough fluid to keep your urine pale yellow. You may benefit from eating bland foods at first. Gradually reintroduce healthy, nutrient-rich foods as tolerated or as told by your health care provider. Avoid foods that make your diarrhea worse, such as fried, greasy, or spicy foods. This information is not intended to replace advice given to you by your health care provider. Make sure you discuss any questions you have with your health care provider. Document Revised: 02/23/2022 Document Reviewed: 01/19/2020 Elsevier Patient Education  [place].

## 2022-09-18 NOTE — Progress Notes (Signed)
Subjective: CC: A-fib PCP: [name], DO HPI:Dalton Vaughan is a 88 y.o. male presenting to clinic today for:  1.  Atrial fibrillation INR goal 2-3 Patient is compliant with medications.  He denies any rectal bleeding.  No epistaxis.  No heart palpitations or chest pain.  2.  Hypertension associate with hyperlipidemia Compliant with Norvasc and Crestor pulsed dosing.  No chest pain or shortness of breath reported.  3.  Overactive bladder Patient reports that he never did start the Myrbetriq because his bladder symptoms started resolving on their own.  4. GI bug Patient reports that he has had some queasiness and nonbloody diarrhea for the last several days.  He had some gassy pain and felt bloated Friday after eating.  He had self prepared his food.  No known cross-contamination.  A few days following the bloating he started developing loose stools.  He denies any vomiting.  He is hydrating without difficulty.  He felt a little myalgia 1 morning when he woke up but overall feels like his stools are getting better though still somewhat loose.   ROS: Per HPI  Allergies  Allergen Reactions   Paxil [Paroxetine Hcl] Palpitations   Codeine Other (See Comments)   Eliquis [Apixaban] Other (See Comments)    dizziness   Penicillins Other (See Comments)    Did it involve swelling of the face/tongue/throat, SOB, or low BP? Unknown Did it involve sudden or severe rash/hives, skin peeling, or any reaction on the inside of your mouth or nose? Unknown Did you need to seek medical attention at a hospital or doctor's office? Unknown When did it last happen?      unk If all above answers are "NO", may proceed with cephalosporin use.    Pravachol [Pravastatin Sodium] Other (See Comments)    myalgias   Zetia [Ezetimibe] Other (See Comments)    myalgias   Vytorin [Ezetimibe-Simvastatin] Other (See Comments)    myalgias   Past Medical History:  Diagnosis Date   Anal fissure     Arthritis    Atrial fibrillation (HCC)    BPH (benign prostatic hypertrophy)    CAD (coronary artery disease)    Dr. Wynonia Lawman   Cataract    COVID-19    Depression 2004   Diverticulosis    GERD (gastroesophageal reflux disease)    History of TIAs    Hyperlipidemia    Hypertension    Hypertensive heart disease without CHF    Hypothyroidism    Internal hemorrhoids    Lumbar disc disease    Oral bleeding 08/20/2012   Following molar tooth extraction July, 2013   Pneumonia    Ruptured disk 1995   Shingles    Thyroid disease    Vitamin D deficiency    Vitreous detachment     Current Outpatient Medications:    amLODipine (NORVASC) 5 MG tablet, TAKE 1 TABLET DAILY, Disp: 90 tablet, Rfl: 1   Calcium Carb-Cholecalciferol (CALCIUM + D3) 600-200 MG-UNIT TABS, Take 1 tablet by mouth daily. , Disp: , Rfl:    cetirizine (ZYRTEC ALLERGY) 10 MG tablet, Take 0.5 tablets (5 mg total) by mouth at bedtime as needed for allergies (drainage)., Disp: 90 tablet, Rfl: 3   Cholecalciferol (VITAMIN D-3) 5000 UNITS TABS, Take 1 tablet by mouth daily., Disp: , Rfl:    doxazosin (CARDURA) 8 MG tablet, Take 1 tablet (8 mg total) by mouth at bedtime., Disp: 90 tablet, Rfl: 0   mirabegron ER (MYRBETRIQ) 25 MG TB24 tablet, Take 1  tablet (25 mg total) by mouth daily., Disp: 45 tablet, Rfl: 0   mirtazapine (REMERON) 15 MG tablet, Take 1 tablet (15 mg total) by mouth at bedtime., Disp: 90 tablet, Rfl: 1   Multiple Vitamin (MULTIVITAMIN) tablet, Take 1 tablet by mouth daily., Disp: , Rfl:    ramipril (ALTACE) 10 MG capsule, TAKE 1 CAPSULE DAILY, Disp: 90 capsule, Rfl: 3   SIMETHICONE PO, Take by mouth as needed., Disp: , Rfl:    SYNTHROID 50 MCG tablet, TAKE 1 TABLET DAILY BEFORE BREAKFAST, Disp: 90 tablet, Rfl: 3   triamterene-hydrochlorothiazide (MAXZIDE-25) 37.5-25 MG tablet, Take 1 tablet by mouth daily., Disp: 90 tablet, Rfl: 0   vitamin C (ASCORBIC ACID) 500 MG tablet, Take 500 mg by mouth daily. , Disp: , Rfl:     warfarin (COUMADIN) 5 MG tablet, TAKE 1 TABLET DAILY AS DIRECTED, Disp: 90 tablet, Rfl: 1   XIIDRA 5 % SOLN, Apply 1 drop to eye as needed. , Disp: , Rfl:    albuterol (VENTOLIN HFA) 108 (90 Base) MCG/ACT inhaler, Inhale 2 puffs into the lungs every 4 (four) hours as needed. (Patient not taking: Reported on 08/07/2022), Disp: , Rfl:    lidocaine (XYLOCAINE) 5 % ointment, Apply 1 application topically as needed. 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  Tobacco Use   Smoking status: Former    Packs/day: 1.00    Years: 18.00    Total pack years: 18.00    Types: Cigarettes    Start date: 12/17/1948    Quit date: 12/17/1966    Years since quitting: 55.7   Smokeless tobacco: Never  Vaping Use   Vaping Use: Never used  Substance and Sexual Activity   Alcohol use: Yes    Alcohol/week: 1.0 standard drink of alcohol    Types: 1 Standard drinks or equivalent per week    Comment: rare   Drug use: No   Sexual activity: Not on file  Other Topics Concern   Not on file  Social History Narrative   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, music at [employer]      Right-handed      Caffeine use: none      Son and grandson live with him   Social Determinants of Health   Financial Resource Strain: Low Risk  (09/05/2021)   Overall Financial Resource Strain (CARDIA)    Difficulty of Paying Living Expenses: Not hard at all  Food Insecurity: No Food Insecurity (09/05/2021)   Hunger Vital Sign    Worried About Running Out of Food in the Last Year: Never true    [place] in the Last Year: Never true  Transportation  Needs: No Transportation Needs (09/05/2021)   PRAPARE - Hydrologist (Medical): No    Lack of Transportation (Non-Medical): No  Physical Activity: Sufficiently Active (09/05/2021)   Exercise Vital Sign    Days of Exercise per Week: 5 days    Minutes of Exercise per Session: 50 min  Stress: No Stress Concern Present (  09/05/2021)   [place]    Feeling of Stress : Only a little  Social Connections: Moderately Isolated (09/05/2021)   Social Connection and Isolation Panel [NHANES]    Frequency of Communication with Friends and Family: More than three times a week    Frequency of Social Gatherings with Friends and Family: More than three times a week    Attends Religious Services: Never    Marine scientist or Organizations: Yes    Attends Archivist Meetings: 1 to 4 times per year    Marital Status: Widowed  Intimate Partner Violence: Not At Risk (09/05/2021)   Humiliation, Afraid, Rape, and Kick questionnaire    Fear of Current or Ex-Partner: No    Emotionally Abused: No    Physically Abused: No    Sexually Abused: No   Family History  Problem Relation Age of Onset   Dementia Mother    Arthritis Mother    Cancer Brother        prostate   Asthma Sister    Heart disease Paternal Aunt    Heart disease Paternal Uncle    Hypertension Maternal Grandmother    CVA Maternal Grandmother    CVA Paternal Grandmother    Hepatitis C Son    Arthritis Son    Colon cancer Neg Hx    Colon polyps Neg Hx    Kidney disease Neg Hx    Esophageal cancer Neg Hx    Gallbladder disease Neg Hx    Diabetes Neg Hx     Objective: Office vital signs reviewed. BP 139/77   Pulse 76   Temp 97.6 F (36.4 C)   Ht 6' 1"  (1.854 m)   Wt 169 lb 9.6 oz (76.9 kg)   SpO2 97%   BMI 22.38 kg/m   Physical Examination:  General: Awake, alert, nontoxic-appearing elderly male, No acute distress HEENT:  sclera white, MMM Cardio: regular rate and rhythm, S1S2 heard, no murmurs appreciated Pulm: clear to auscultation bilaterally, no wheezes, rhonchi or rales; normal work of breathing on room air GI: soft, non-tender, non-distended, bowel sounds present x4, no hepatomegaly, no splenomegaly, no masses MSK: Ambulating independently  Assessment/ Plan: 88 y.o. male   Supratherapeutic INR  Chronic anticoagulation - Plan: CoaguChek XS/INR Waived  Aortic atherosclerosis (HCC) - Plan: rosuvastatin (CRESTOR) 5 MG tablet  Primary hypertension - Plan: amLODipine (NORVASC) 5 MG tablet  Nocturia  Overactive bladder  Viral gastroenteritis - Plan: ondansetron (ZOFRAN-ODT) 4 MG disintegrating tablet  INR supratherapeutic and likely secondary to change in dietary habits given viral gastroenteritis.  No changes for now.  Okay to incorporate some greens.  We will reassess him again in 6 to 8 weeks an appointment has been made for him  Continue Crestor.  This has been renewed.  Blood pressure well controlled.  Medication renewed  Nocturia and overactive bladder resolved on its own and he never did need to take the Myrbetriq.  I gave him Zofran for as needed use given his ongoing mild nausea.  He was well-appearing on exam and demonstrated no evidence of dehydration.  No red flag signs or symptoms so continue to eat bland foods, hydrate well and follow-up as needed this issue  Orders Placed This Encounter  Procedures   CoaguChek XS/INR Waived   No orders of the defined types were placed in this encounter.    [name], DO [place] [phone]

## 2022-09-19 ENCOUNTER — Telehealth: Payer: Self-pay | Admitting: Family Medicine

## 2022-09-19 NOTE — Telephone Encounter (Signed)
[facility] says that [facility] called him to tell office that they need clarification on rx. Please call back at [phone]

## 2022-09-21 NOTE — Telephone Encounter (Signed)
Pt not allergic to rosuvastatin and [facility] aware and have sent meds to pt

## 2022-10-18 DIAGNOSIS — I70203 Unspecified atherosclerosis of native arteries of extremities, bilateral legs: Secondary | ICD-10-CM | POA: Diagnosis not present

## 2022-10-18 DIAGNOSIS — M79676 Pain in unspecified toe(s): Secondary | ICD-10-CM | POA: Diagnosis not present

## 2022-10-18 DIAGNOSIS — L84 Corns and callosities: Secondary | ICD-10-CM | POA: Diagnosis not present

## 2022-10-18 DIAGNOSIS — B351 Tinea unguium: Secondary | ICD-10-CM | POA: Diagnosis not present

## 2022-10-31 DIAGNOSIS — L814 Other melanin hyperpigmentation: Secondary | ICD-10-CM | POA: Diagnosis not present

## 2022-10-31 DIAGNOSIS — D485 Neoplasm of uncertain behavior of skin: Secondary | ICD-10-CM | POA: Diagnosis not present

## 2022-10-31 DIAGNOSIS — I781 Nevus, non-neoplastic: Secondary | ICD-10-CM | POA: Diagnosis not present

## 2022-10-31 DIAGNOSIS — L821 Other seborrheic keratosis: Secondary | ICD-10-CM | POA: Diagnosis not present

## 2022-11-03 IMAGING — DX DG WRIST COMPLETE 3+V*L*
3 series · 3 of 3 positions shown · non-contrast
Comparison: None.

CLINICAL DATA: FOOSH [REDACTED] on left. ongoing swelling

EXAM:
LEFT WRIST - COMPLETE 3+ VIEW

[wrist ap]
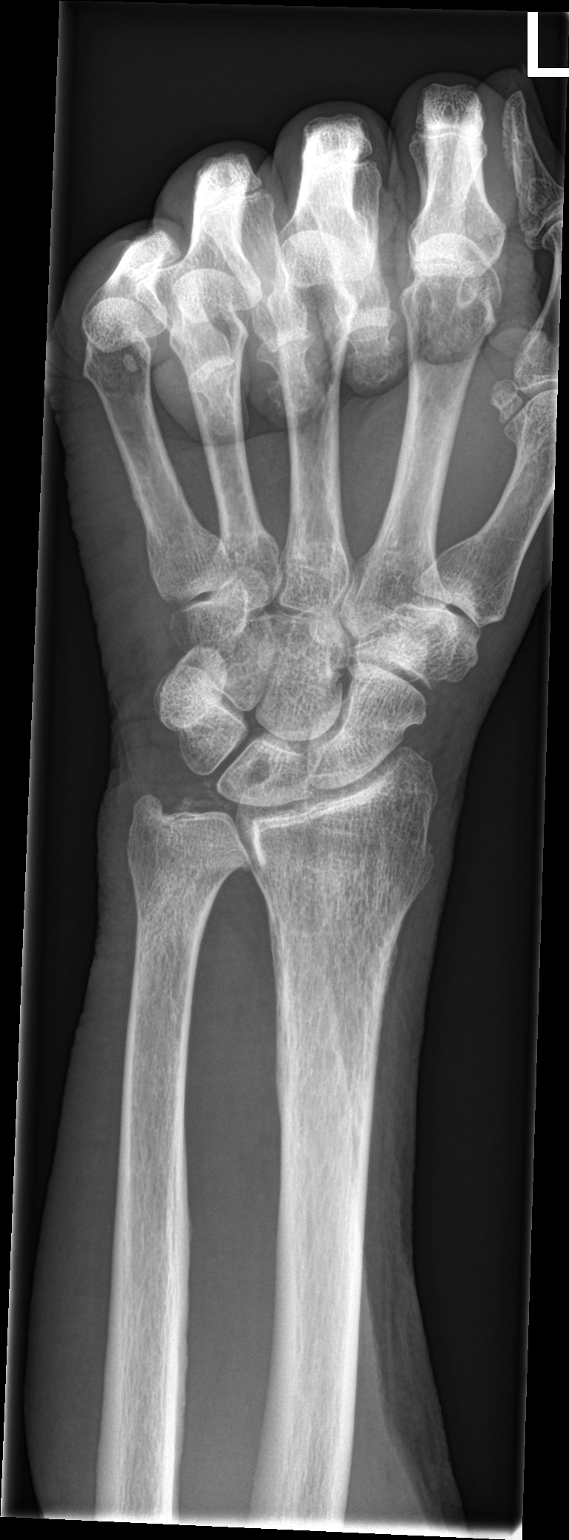

[wrist lat]
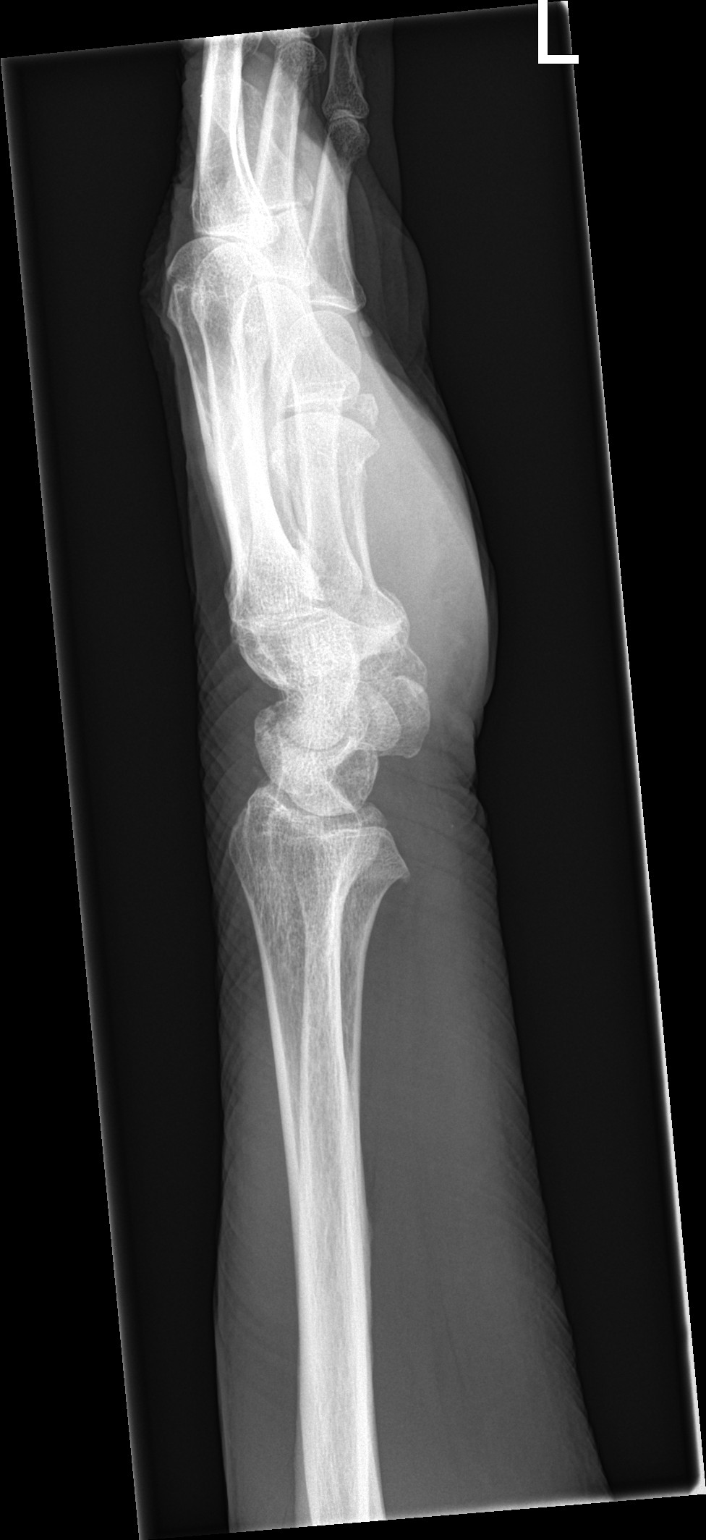

[wrist obl]
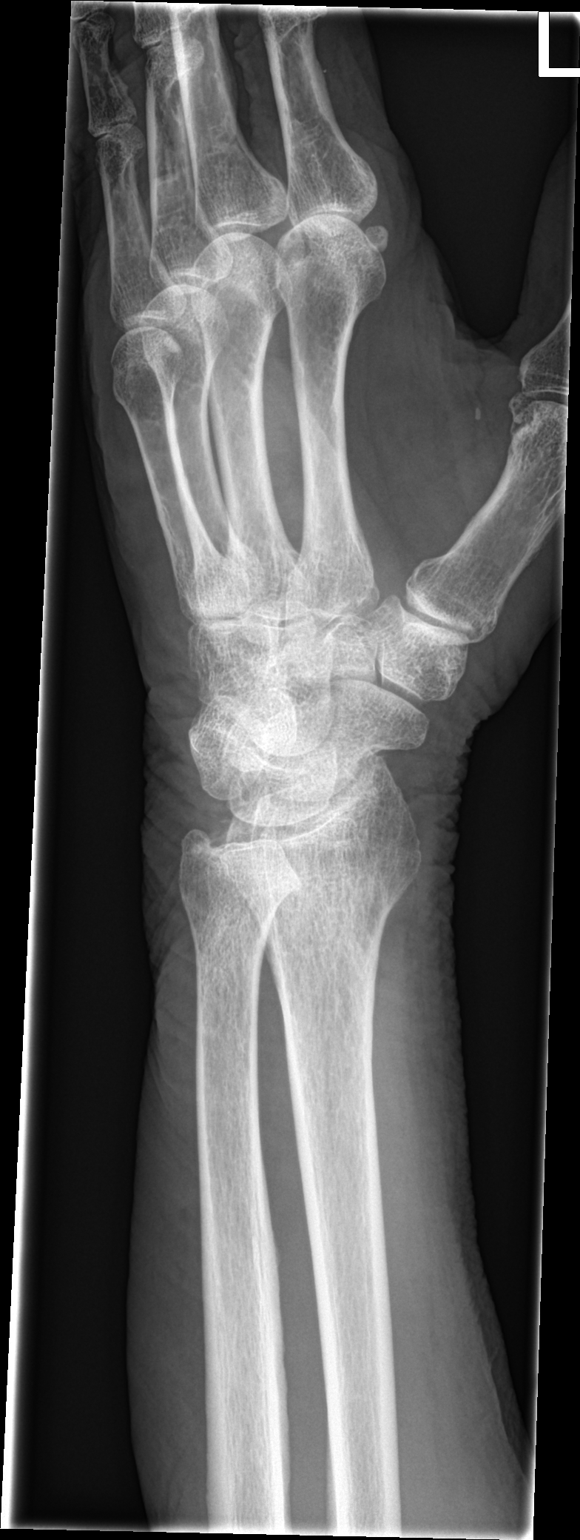

[3 of 3 positions shown; findings below may reference images not displayed]

FINDINGS: Normal alignment with approximation of the joints. Diffuse
intercarpal, DRUJ and radiocarpal joint space loss with subchondral
sclerosis. Lunate geode. Intra-articular sclerotic line involving
the distal radius may reflect a nondisplaced subacute fracture
deformity. TFCC chondrocalcinosis. Prominent soft tissues.
IMPRESSION: 1. Distal radius linear sclerosis may reflect subacute nondisplaced
fracture deformity.
2. Mild osteoarthrosis.
3. TFCC chondrocalcinosis may reflect chronic sequela of CPPD.

## 2022-11-05 ENCOUNTER — Other Ambulatory Visit: Payer: Self-pay | Admitting: Family Medicine

## 2022-11-05 DIAGNOSIS — I495 Sick sinus syndrome: Secondary | ICD-10-CM | POA: Diagnosis not present

## 2022-11-05 DIAGNOSIS — Z950 Presence of cardiac pacemaker: Secondary | ICD-10-CM | POA: Diagnosis not present

## 2022-11-05 DIAGNOSIS — F321 Major depressive disorder, single episode, moderate: Secondary | ICD-10-CM

## 2022-11-05 DIAGNOSIS — R001 Bradycardia, unspecified: Secondary | ICD-10-CM | POA: Diagnosis not present

## 2022-11-05 DIAGNOSIS — I4891 Unspecified atrial fibrillation: Secondary | ICD-10-CM | POA: Diagnosis not present

## 2022-11-05 DIAGNOSIS — I2510 Atherosclerotic heart disease of native coronary artery without angina pectoris: Secondary | ICD-10-CM | POA: Diagnosis not present

## 2022-11-05 DIAGNOSIS — Z951 Presence of aortocoronary bypass graft: Secondary | ICD-10-CM | POA: Diagnosis not present

## 2022-11-20 ENCOUNTER — Encounter: Payer: Self-pay | Admitting: Family Medicine

## 2022-11-20 ENCOUNTER — Ambulatory Visit (INDEPENDENT_AMBULATORY_CARE_PROVIDER_SITE_OTHER): Payer: Medicare Other | Admitting: Family Medicine

## 2022-11-20 VITALS — BP 108/63 | HR 84 | Temp 97.6°F | Ht 73.0 in | Wt 169.6 lb

## 2022-11-20 DIAGNOSIS — R791 Abnormal coagulation profile: Secondary | ICD-10-CM | POA: Diagnosis not present

## 2022-11-20 DIAGNOSIS — R1032 Left lower quadrant pain: Secondary | ICD-10-CM | POA: Diagnosis not present

## 2022-11-20 DIAGNOSIS — Z7901 Long term (current) use of anticoagulants: Secondary | ICD-10-CM | POA: Diagnosis not present

## 2022-11-20 DIAGNOSIS — I482 Chronic atrial fibrillation, unspecified: Secondary | ICD-10-CM

## 2022-11-20 DIAGNOSIS — K5909 Other constipation: Secondary | ICD-10-CM

## 2022-11-20 LAB — COAGUCHEK XS/INR WAIVED
INR: 2.8 — ABNORMAL HIGH (ref 0.9–1.1)
Prothrombin Time: 33.2 s

## 2022-11-20 MED ORDER — LINACLOTIDE 145 MCG PO CAPS: 145.0000 ug | ORAL_CAPSULE | Freq: Every day | ORAL | 0 refills | Status: DC

## 2022-11-20 NOTE — Patient Instructions (Signed)
If belly pain gets worse, you have blood in stool or develop nausea/ fevers call me Linzess for constipation daily as needed.

## 2022-11-20 NOTE — Progress Notes (Signed)
Subjective: CC: INR check PCP: [name], DO HPI:Dalton Vaughan is a 88 y.o. male presenting to clinic today for:  1.  Chronic atrial fibrillation Goal INR 2-3 Patient is compliant with medications.  He has been eating more cabbage and zucchini lately in efforts to improve bowel movements as he had quite a bit of abdominal discomfort that radiated from the left lower abdomen to the mid abdomen.  This has got about 50% better since adding more fiber and having better bowel movements.  He had a similar event a while back and this was evaluated by his gastroenterologist with CAT scan which demonstrated moderate amounts of stool.  She gave him a bowel regimen but he notes he only did "half of it".  It was not effective.  No blood in stool.  No nausea, vomiting or fevers.   ROS: Per HPI  Allergies  Allergen Reactions   Paxil [Paroxetine Hcl] Palpitations   Codeine Other (See Comments)   Eliquis [Apixaban] Other (See Comments)    dizziness   Penicillins Other (See Comments)    Did it involve swelling of the face/tongue/throat, SOB, or low BP? Unknown Did it involve sudden or severe rash/hives, skin peeling, or any reaction on the inside of your mouth or nose? Unknown Did you need to seek medical attention at a hospital or doctor's office? Unknown When did it last happen?      unk If all above answers are "NO", may proceed with cephalosporin use.    Pravachol [Pravastatin Sodium] Other (See Comments)    myalgias   Zetia [Ezetimibe] Other (See Comments)    myalgias   Vytorin [Ezetimibe-Simvastatin] Other (See Comments)    myalgias   Past Medical History:  Diagnosis Date   Anal fissure    Arthritis    Atrial fibrillation (HCC)    BPH (benign prostatic hypertrophy)    CAD (coronary artery disease)    Dr. Wynonia Lawman   Cataract    COVID-19    Depression 2004   Diverticulosis    GERD (gastroesophageal reflux disease)    History of TIAs    Hyperlipidemia    Hypertension     Hypertensive heart disease without CHF    Hypothyroidism    Internal hemorrhoids    Lumbar disc disease    Oral bleeding 08/20/2012   Following molar tooth extraction July, 2013   Pneumonia    Ruptured disk 1995   Shingles    Thyroid disease    Vitamin D deficiency    Vitreous detachment     Current Outpatient Medications:    amLODipine (NORVASC) 5 MG tablet, Take 1 tablet (5 mg total) by mouth daily., Disp: 90 tablet, Rfl: 3   Calcium Carb-Cholecalciferol (CALCIUM + D3) 600-200 MG-UNIT TABS, Take 1 tablet by mouth daily. , Disp: , Rfl:    cetirizine (ZYRTEC ALLERGY) 10 MG tablet, Take 0.5 tablets (5 mg total) by mouth at bedtime as needed for allergies (drainage)., Disp: 90 tablet, Rfl: 3   Cholecalciferol (VITAMIN D-3) 5000 UNITS TABS, Take 1 tablet by mouth daily., Disp: , Rfl:    doxazosin (CARDURA) 8 MG tablet, Take 1 tablet (8 mg total) by mouth at bedtime., Disp: 90 tablet, Rfl: 3   linaclotide (LINZESS) 145 MCG CAPS capsule, Take 1 capsule (145 mcg total) by mouth daily before breakfast., Disp: 8 capsule, Rfl: 0   mirabegron ER (MYRBETRIQ) 25 MG TB24 tablet, Take 1 tablet (25 mg total) by mouth daily., Disp: 45 tablet, Rfl: 0  mirtazapine (REMERON) 15 MG tablet, TAKE 1 TABLET AT BEDTIME, Disp: 90 tablet, Rfl: 0   Multiple Vitamin (MULTIVITAMIN) tablet, Take 1 tablet by mouth daily., Disp: , Rfl:    ondansetron (ZOFRAN-ODT) 4 MG disintegrating tablet, Take 1 tablet (4 mg total) by mouth every 8 (eight) hours as needed for nausea or vomiting., Disp: 20 tablet, Rfl: 0   ramipril (ALTACE) 10 MG capsule, TAKE 1 CAPSULE DAILY, Disp: 90 capsule, Rfl: 3   rosuvastatin (CRESTOR) 5 MG tablet, Take 1 tablet by mouth Mondays, Wednesdays and Fridays, Disp: 36 tablet, Rfl: 3   SIMETHICONE PO, Take by mouth as needed., Disp: , Rfl:    SYNTHROID 50 MCG tablet, TAKE 1 TABLET DAILY BEFORE BREAKFAST, Disp: 90 tablet, Rfl: 3   triamterene-hydrochlorothiazide (MAXZIDE-25) 37.5-25 MG tablet, Take 1  tablet by mouth daily., Disp: 90 tablet, Rfl: 3   vitamin C (ASCORBIC ACID) 500 MG tablet, Take 500 mg by mouth daily. , Disp: , Rfl:    warfarin (COUMADIN) 5 MG tablet, TAKE 1 TABLET DAILY AS DIRECTED, Disp: 90 tablet, Rfl: 1   XIIDRA 5 % SOLN, Apply 1 drop to eye as needed. , Disp: , Rfl:    albuterol (VENTOLIN HFA) 108 (90 Base) MCG/ACT inhaler, Inhale 2 puffs into the lungs every 4 (four) hours as needed. (Patient not taking: Reported on 08/07/2022), Disp: , Rfl:    lidocaine (XYLOCAINE) 5 % ointment, Apply 1 application topically as needed. 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  Tobacco Use   Smoking status: Former    Packs/day: 1.00    Years: 18.00    Total pack years: 18.00    Types: Cigarettes    Start date: 12/17/1948    Quit date: 12/17/1966    Years since quitting: 55.9   Smokeless tobacco: Never  Vaping Use   Vaping Use: Never used  Substance and Sexual Activity   Alcohol use: Yes    Alcohol/week: 1.0 standard drink of alcohol    Types: 1 Standard drinks or equivalent per week    Comment: rare   Drug use: No   Sexual activity: Not on file  Other Topics Concern   Not on file  Social History Narrative   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, music at [employer]      Right-handed      Caffeine use: none      Son and grandson live with him   Social Determinants of Health   Financial Resource Strain: Low Risk  (09/05/2021)   Overall Financial Resource Strain (CARDIA)    Difficulty of Paying Living Expenses: Not hard at all  Food Insecurity: No Food Insecurity (09/05/2021)   Hunger Vital Sign     Worried About Running Out of Food in the Last Year: Never true    [place] in the Last Year: Never true  Transportation Needs: No Transportation Needs (09/05/2021)   PRAPARE - Hydrologist (Medical): No    Lack of Transportation (Non-Medical): No  Physical Activity: Sufficiently  Active (09/05/2021)   Exercise Vital Sign    Days of Exercise per Week: 5 days    Minutes of Exercise per Session: 50 min  Stress: No Stress Concern Present (09/05/2021)   [place]    Feeling of Stress : Only a little  Social Connections: Moderately Isolated (09/05/2021)   Social Connection and Isolation Panel [NHANES]    Frequency of Communication with Friends and Family: More than three times a week    Frequency of Social Gatherings with Friends and Family: More than three times a week    Attends Religious Services: Never    Marine scientist or Organizations: Yes    Attends Archivist Meetings: 1 to 4 times per year    Marital Status: Widowed  Intimate Partner Violence: Not At Risk (09/05/2021)   Humiliation, Afraid, Rape, and Kick questionnaire    Fear of Current or Ex-Partner: No    Emotionally Abused: No    Physically Abused: No    Sexually Abused: No   Family History  Problem Relation Age of Onset   Dementia Mother    Arthritis Mother    Cancer Brother        prostate   Asthma Sister    Heart disease Paternal Aunt    Heart disease Paternal Uncle    Hypertension Maternal Grandmother    CVA Maternal Grandmother    CVA Paternal Grandmother    Hepatitis C Son    Arthritis Son    Colon cancer Neg Hx    Colon polyps Neg Hx    Kidney disease Neg Hx    Esophageal cancer Neg Hx    Gallbladder disease Neg Hx    Diabetes Neg Hx     Objective: Office vital signs reviewed. BP 108/63   Pulse 84   Temp 97.6 F (36.4 C)   Ht 6' 1"  (1.854 m)   Wt 169 lb 9.6 oz (76.9 kg)   SpO2  97%   BMI 22.38 kg/m   Physical Examination:  General: Awake, alert, thin elderly male, No acute distress HEENT: Sclera white.  Moist mucous membranes Cardio: Irregularly with rate controlled.  S1S2 heard, no murmurs appreciated Pulm: clear to auscultation bilaterally, no wheezes, rhonchi or rales; normal work of breathing on room air GI: Soft, minimal left lower and left upper tenderness without rebound or guarding.  No palpable masses. GU: No suprapubic tenderness to palpation  Assessment/ Plan: 88 y.o. male   Chronic anticoagulation - Plan: CoaguChek XS/INR Waived  Chronic atrial fibrillation (HCC) - Plan: CoaguChek XS/INR Waived  Other constipation - Plan: linaclotide (LINZESS) 145 MCG CAPS capsule  Left lower quadrant abdominal pain - Plan: Urinalysis, Routine w reflex microscopic  INR is therapeutic.  No changes.  May follow-up in 6 to 8 weeks  Trial of Linzess for bowel movements.  Samples provided.  We discussed signs and symptoms that would be more suggestive of diverticulitis.  He will monitor for these and contact us if needed.  Check urine just to make sure that this is not in fact some type of UTI that is not easily identified.  Orders Placed This Encounter  Procedures   CoaguChek XS/INR Waived   Urinalysis, Routine w reflex microscopic   Meds ordered this encounter  Medications   linaclotide (LINZESS) 145 MCG CAPS capsule    Sig: Take 1 capsule (145 mcg total) by mouth daily before breakfast.    Dispense:  8 capsule  Refill:  [place], [place] [phone]

## 2022-11-21 DIAGNOSIS — R972 Elevated prostate specific antigen [PSA]: Secondary | ICD-10-CM | POA: Diagnosis not present

## 2022-11-21 LAB — URINALYSIS, ROUTINE W REFLEX MICROSCOPIC
Bilirubin, UA: NEGATIVE
Glucose, UA: NEGATIVE
Ketones, UA: NEGATIVE
Leukocytes,UA: NEGATIVE
Nitrite, UA: NEGATIVE
Protein,UA: NEGATIVE
RBC, UA: NEGATIVE
Specific Gravity, UA: 1.015 (ref 1.005–1.030)
Urobilinogen, Ur: 0.2 mg/dL (ref 0.2–1.0)
pH, UA: 8 — ABNORMAL HIGH (ref 5.0–7.5)

## 2022-11-28 DIAGNOSIS — R972 Elevated prostate specific antigen [PSA]: Secondary | ICD-10-CM | POA: Diagnosis not present

## 2022-11-28 DIAGNOSIS — R351 Nocturia: Secondary | ICD-10-CM | POA: Diagnosis not present

## 2022-11-28 DIAGNOSIS — N401 Enlarged prostate with lower urinary tract symptoms: Secondary | ICD-10-CM | POA: Diagnosis not present

## 2022-12-27 DIAGNOSIS — I70203 Unspecified atherosclerosis of native arteries of extremities, bilateral legs: Secondary | ICD-10-CM | POA: Diagnosis not present

## 2022-12-27 DIAGNOSIS — B351 Tinea unguium: Secondary | ICD-10-CM | POA: Diagnosis not present

## 2022-12-27 DIAGNOSIS — M79676 Pain in unspecified toe(s): Secondary | ICD-10-CM | POA: Diagnosis not present

## 2022-12-27 DIAGNOSIS — L84 Corns and callosities: Secondary | ICD-10-CM | POA: Diagnosis not present

## 2023-01-01 ENCOUNTER — Ambulatory Visit (INDEPENDENT_AMBULATORY_CARE_PROVIDER_SITE_OTHER): Payer: Medicare Other | Admitting: Family Medicine

## 2023-01-01 ENCOUNTER — Encounter: Payer: Self-pay | Admitting: Family Medicine

## 2023-01-01 VITALS — BP 128/75 | HR 93 | Temp 97.9°F | Ht 73.0 in | Wt 178.2 lb

## 2023-01-01 DIAGNOSIS — E079 Disorder of thyroid, unspecified: Secondary | ICD-10-CM | POA: Diagnosis not present

## 2023-01-01 DIAGNOSIS — M7989 Other specified soft tissue disorders: Secondary | ICD-10-CM | POA: Diagnosis not present

## 2023-01-01 DIAGNOSIS — Z7901 Long term (current) use of anticoagulants: Secondary | ICD-10-CM

## 2023-01-01 DIAGNOSIS — I482 Chronic atrial fibrillation, unspecified: Secondary | ICD-10-CM | POA: Diagnosis not present

## 2023-01-01 DIAGNOSIS — I700 Atherosclerosis of aorta: Secondary | ICD-10-CM | POA: Diagnosis not present

## 2023-01-01 LAB — COAGUCHEK XS/INR WAIVED
INR: 2.2 — ABNORMAL HIGH (ref 0.9–1.1)
Prothrombin Time: 26 s

## 2023-01-01 NOTE — Progress Notes (Signed)
Subjective: CC: INR recheck PCP: Dalton Norlander, DO HPI:Dalton Vaughan is a 89 y.o. male presenting to clinic today for:  1.  Atrial fibrillation associate with hypertension Goal INR 2-3 Patient is compliant with Coumadin.  Denies any bleeding.  He does report some ongoing swelling in the right leg greater than left leg.  This has been present to higher degree since switching from amlodipine to diltiazem.  He has been off of diltiazem for over 2 weeks now and back on the amlodipine but the swelling has really not resolved.  He does not keep his legs elevated at home but notes this is because he is on his feet often.  Sometimes the pain in the legs is exacerbated by the swelling.  Not using compression hose but would be willing to do so.  2.  Hypothyroidism Having some edema as above.  No reports of heart palpitations, tremor, changes in bowel habits.   ROS: Per HPI  Allergies  Allergen Reactions   Paxil [Paroxetine Hcl] Palpitations   Codeine Other (See Comments)   Eliquis [Apixaban] Other (See Comments)    dizziness   Penicillins Other (See Comments)    Did it involve swelling of the face/tongue/throat, SOB, or low BP? Unknown Did it involve sudden or severe rash/hives, skin peeling, or any reaction on the inside of your mouth or nose? Unknown Did you need to seek medical attention at a hospital or doctor's office? Unknown When did it last happen?      unk If all above answers are "NO", may proceed with cephalosporin use.    Pravachol [Pravastatin Sodium] Other (See Comments)    myalgias   Zetia [Ezetimibe] Other (See Comments)    myalgias   Vytorin [Ezetimibe-Simvastatin] Other (See Comments)    myalgias   Past Medical History:  Diagnosis Date   Anal fissure    Arthritis    Atrial fibrillation (HCC)    BPH (benign prostatic hypertrophy)    CAD (coronary artery disease)    Dr. Wynonia Lawman   Cataract    COVID-19    Depression 2004   Diverticulosis    GERD  (gastroesophageal reflux disease)    History of TIAs    Hyperlipidemia    Hypertension    Hypertensive heart disease without CHF    Hypothyroidism    Internal hemorrhoids    Lumbar disc disease    Oral bleeding 08/20/2012   Following molar tooth extraction July, 2013   Pneumonia    Ruptured disk 1995   Shingles    Thyroid disease    Vitamin D deficiency    Vitreous detachment     Current Outpatient Medications:    albuterol (VENTOLIN HFA) 108 (90 Base) MCG/ACT inhaler, Inhale 2 puffs into the lungs every 4 (four) hours as needed. (Patient not taking: Reported on 08/07/2022), Disp: , Rfl:    amLODipine (NORVASC) 5 MG tablet, Take 1 tablet (5 mg total) by mouth daily., Disp: 90 tablet, Rfl: 3   Calcium Carb-Cholecalciferol (CALCIUM + D3) 600-200 MG-UNIT TABS, Take 1 tablet by mouth daily. , Disp: , Rfl:    cetirizine (ZYRTEC ALLERGY) 10 MG tablet, Take 0.5 tablets (5 mg total) by mouth at bedtime as needed for allergies (drainage)., Disp: 90 tablet, Rfl: 3   Cholecalciferol (VITAMIN D-3) 5000 UNITS TABS, Take 1 tablet by mouth daily., Disp: , Rfl:    doxazosin (CARDURA) 8 MG tablet, Take 1 tablet (8 mg total) by mouth at bedtime., Disp: 90 tablet, Rfl: 3  lidocaine (XYLOCAINE) 5 % ointment, Apply 1 application topically as needed. (Patient not taking: Reported on 08/07/2022), Disp: 35.44 g, Rfl: 0   linaclotide (LINZESS) 145 MCG CAPS capsule, Take 1 capsule (145 mcg total) by mouth daily before breakfast., Disp: 8 capsule, Rfl: 0   mirabegron ER (MYRBETRIQ) 25 MG TB24 tablet, Take 1 tablet (25 mg total) by mouth daily., Disp: 45 tablet, Rfl: 0   mirtazapine (REMERON) 15 MG tablet, TAKE 1 TABLET AT BEDTIME, Disp: 90 tablet, Rfl: 0   Multiple Vitamin (MULTIVITAMIN) tablet, Take 1 tablet by mouth daily., Disp: , Rfl:    ondansetron (ZOFRAN-ODT) 4 MG disintegrating tablet, Take 1 tablet (4 mg total) by mouth every 8 (eight) hours as needed for nausea or vomiting., Disp: 20 tablet, Rfl: 0    ramipril (ALTACE) 10 MG capsule, TAKE 1 CAPSULE DAILY, Disp: 90 capsule, Rfl: 3   rosuvastatin (CRESTOR) 5 MG tablet, Take 1 tablet by mouth Mondays, Wednesdays and Fridays, Disp: 36 tablet, Rfl: 3   SIMETHICONE PO, Take by mouth as needed., Disp: , Rfl:    SYNTHROID 50 MCG tablet, TAKE 1 TABLET DAILY BEFORE BREAKFAST, Disp: 90 tablet, Rfl: 3   triamterene-hydrochlorothiazide (MAXZIDE-25) 37.5-25 MG tablet, Take 1 tablet by mouth daily., Disp: 90 tablet, Rfl: 3   vitamin C (ASCORBIC ACID) 500 MG tablet, Take 500 mg by mouth daily. , Disp: , Rfl:    warfarin (COUMADIN) 5 MG tablet, TAKE 1 TABLET DAILY AS DIRECTED, Disp: 90 tablet, Rfl: 1   XIIDRA 5 % SOLN, Apply 1 drop to eye as needed. 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  Tobacco Use   Smoking status: Former    Packs/day: 1.00    Years: 18.00    Total pack years: 18.00    Types: Cigarettes    Start date: 12/17/1948    Quit date: 12/17/1966    Years since quitting: 56.0   Smokeless tobacco: Never  Vaping Use   Vaping Use: Never used  Substance and Sexual Activity   Alcohol use: Yes    Alcohol/week: 1.0 standard drink of alcohol    Types: 1 Standard drinks or equivalent per week    Comment: rare   Drug use: No   Sexual activity: Not on file  Other Topics Concern   Not on file  Social History Narrative   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, music at [employer]      Right-handed      Caffeine use: none      Son and grandson live with him   Social Determinants of Health   Financial Resource Strain: Low Risk  (09/05/2021)   Overall Financial Resource Strain (CARDIA)    Difficulty of Paying Living  Expenses: Not hard at all  Food Insecurity: No Food Insecurity (09/05/2021)   Hunger Vital Sign    Worried About Running Out of Food in the Last Year: Never true    [place] in the Last Year: Never true  Transportation Needs: No Transportation Needs (09/05/2021)   PRAPARE - Transportation    Lack of  Transportation (Medical): No    Lack of Transportation (Non-Medical): No  Physical Activity: Sufficiently Active (09/05/2021)   Exercise Vital Sign    Days of Exercise per Week: 5 days    Minutes of Exercise per Session: 50 min  Stress: No Stress Concern Present (09/05/2021)   [place]    Feeling of Stress : Only a little  Social Connections: Moderately Isolated (09/05/2021)   Social Connection and Isolation Panel [NHANES]    Frequency of Communication with Friends and Family: More than three times a week    Frequency of Social Gatherings with Friends and Family: More than three times a week    Attends Religious Services: Never    Marine scientist or Organizations: Yes    Attends Archivist Meetings: 1 to 4 times per year    Marital Status: Widowed  Intimate Partner Violence: Not At Risk (09/05/2021)   Humiliation, Afraid, Rape, and Kick questionnaire    Fear of Current or Ex-Partner: No    Emotionally Abused: No    Physically Abused: No    Sexually Abused: No   Family History  Problem Relation Age of Onset   Dementia Mother    Arthritis Mother    Cancer Brother        prostate   Asthma Sister    Heart disease Paternal Aunt    Heart disease Paternal Uncle    Hypertension Maternal Grandmother    CVA Maternal Grandmother    CVA Paternal Grandmother    Hepatitis C Son    Arthritis Son    Colon cancer Neg Hx    Colon polyps Neg Hx    Kidney disease Neg Hx    Esophageal cancer Neg Hx    Gallbladder disease Neg Hx    Diabetes Neg Hx     Objective: Office vital signs reviewed. BP 128/75    Pulse 93   Temp 97.9 F (36.6 C)   Ht '6\' 1"'$  (1.854 m)   Wt 178 lb 3.2 oz (80.8 kg)   SpO2 97%   BMI 23.51 kg/m   Physical Examination:  General: Awake, alert, well nourished, No acute distress HEENT: Sclera white.  No exophthalmos Cardio: regular rate and rhythm, S1S2 heard, no murmurs appreciated Pulm: clear to auscultation bilaterally, no wheezes, rhonchi or rales; normal work of breathing on room air Extremities: warm, has hemosiderin deposition along the skin of bilateral lower extremities.  He has 1+ pitting edema on the right with trace to +1 pitting edema on the left.  No skin breakdown or evidence of secondary bacterial infection MSK: Ambulating independently  Assessment/ Plan: 89 y.o. male   Chronic atrial fibrillation ([place]) - Plan: Magnesium, CBC  Chronic anticoagulation - Plan: CoaguChek XS/INR Waived, CBC  Thyroid disease - Plan: TSH, T4, Free  Aortic atherosclerosis (HCC) - Plan: CMP14+EGFR, Lipid Panel  Leg swelling - Plan: Compression stockings  Rate and rhythm controlled today.  INR was good.  May follow-up in 6 weeks, sooner if concerns arise.  Check magnesium, CBC and CMP.  Lipid panel and thyroid levels drawn today.  Unna boot placed on the right lower extremity.  I given him a prescription for compression stockings.  Hopefully he will see resolution in the edema after the Unna boot but we discussed that we could consider low-dose Lasix if needed.  I hesitate given advanced age and concomitant use of triamterene-hydrochlorothiazide but this may be appropriate.  Will CC cardiology as  FYI  No orders of the defined types were placed in this encounter.  No orders of the defined types were placed in this encounter.    Dalton Norlander, DO [facility] [place] [phone]

## 2023-01-02 LAB — CMP14+EGFR
ALT: 13 IU/L (ref 0–44)
AST: 17 IU/L (ref 0–40)
Albumin/Globulin Ratio: 2 (ref 1.2–2.2)
Albumin: 4.3 g/dL (ref 3.6–4.6)
Alkaline Phosphatase: 125 IU/L — ABNORMAL HIGH (ref 44–121)
BUN/Creatinine Ratio: 14 (ref 10–24)
BUN: 12 mg/dL (ref 10–36)
Bilirubin Total: 0.5 mg/dL (ref 0.0–1.2)
CO2: 27 mmol/L (ref 20–29)
Calcium: 9.5 mg/dL (ref 8.6–10.2)
Chloride: 101 mmol/L (ref 96–106)
Creatinine, Ser: 0.86 mg/dL (ref 0.76–1.27)
Globulin, Total: 2.2 g/dL (ref 1.5–4.5)
Glucose: 79 mg/dL (ref 70–99)
Potassium: 4 mmol/L (ref 3.5–5.2)
Sodium: 141 mmol/L (ref 134–144)
Total Protein: 6.5 g/dL (ref 6.0–8.5)
eGFR: 82 mL/min/{1.73_m2} (ref >59)

## 2023-01-02 LAB — LIPID PANEL
Chol/HDL Ratio: 3.8 ratio (ref 0.0–5.0)
Cholesterol, Total: 196 mg/dL (ref 100–199)
HDL: 51 mg/dL (ref >39)
LDL Chol Calc (NIH): 130 mg/dL — ABNORMAL HIGH (ref 0–99)
Triglycerides: 82 mg/dL (ref 0–149)
VLDL Cholesterol Cal: 15 mg/dL (ref 5–40)

## 2023-01-02 LAB — MAGNESIUM: Magnesium: 2.4 mg/dL — ABNORMAL HIGH (ref 1.6–2.3)

## 2023-01-02 LAB — CBC
Hematocrit: 40.5 % (ref 37.5–51.0)
Hemoglobin: 12.9 g/dL — ABNORMAL LOW (ref 13.0–17.7)
MCH: 29.7 pg (ref 26.6–33.0)
MCHC: 31.9 g/dL (ref 31.5–35.7)
MCV: 93 fL (ref 79–97)
Platelets: 147 10*3/uL — ABNORMAL LOW (ref 150–450)
RBC: 4.34 x10E6/uL (ref 4.14–5.80)
RDW: 13.9 % (ref 11.6–15.4)
WBC: 5.4 10*3/uL (ref 3.4–10.8)

## 2023-01-02 LAB — T4, FREE: Free T4: 1.26 ng/dL (ref 0.82–1.77)

## 2023-01-02 LAB — TSH: TSH: 4.28 u[IU]/mL (ref 0.450–4.500)

## 2023-01-18 DIAGNOSIS — I495 Sick sinus syndrome: Secondary | ICD-10-CM | POA: Diagnosis not present

## 2023-01-25 ENCOUNTER — Encounter: Payer: Self-pay | Admitting: Nurse Practitioner

## 2023-01-25 ENCOUNTER — Ambulatory Visit (INDEPENDENT_AMBULATORY_CARE_PROVIDER_SITE_OTHER): Payer: Medicare Other | Admitting: Nurse Practitioner

## 2023-01-25 VITALS — BP 139/73 | HR 84 | Temp 98.5°F | Resp 20 | Ht 73.0 in | Wt 177.0 lb

## 2023-01-25 DIAGNOSIS — J069 Acute upper respiratory infection, unspecified: Secondary | ICD-10-CM

## 2023-01-25 NOTE — Progress Notes (Signed)
Subjective:    Patient ID: Dalton Vaughan, male    DOB: [birth year], 89 y.o.   MRN: [id number]   Chief Complaint: Chest congestion and Cough (No fever/)   Cough This is a new problem. Episode onset: MOnday night. The problem has been gradually improving. The problem occurs every few hours. The cough is Productive of sputum. Associated symptoms include headaches (slight), nasal congestion and a sore throat (very mild). Pertinent negatives include no chest pain, chills, ear congestion, fever, rash or shortness of breath. Nothing aggravates the symptoms. Treatments tried: chlorcidin. The treatment provided mild relief.       Review of Systems  Constitutional:  Negative for chills, diaphoresis and fever.  HENT:  Positive for sore throat (very mild).   Eyes:  Negative for pain.  Respiratory:  Positive for cough. Negative for shortness of breath.   Cardiovascular:  Negative for chest pain, palpitations and leg swelling.  Gastrointestinal:  Negative for abdominal pain.  Endocrine: Negative for polydipsia.  Skin:  Negative for rash.  Neurological:  Positive for headaches (slight). Negative for dizziness and weakness.  Hematological:  Does not bruise/bleed easily.  All other systems reviewed and are negative.      Objective:   Physical Exam Vitals reviewed.  Constitutional:      Appearance: Normal appearance.  HENT:     Right Ear: Tympanic membrane normal.     Left Ear: Tympanic membrane normal.     Nose: Congestion and rhinorrhea present.     Mouth/Throat:     Pharynx: No oropharyngeal exudate or posterior oropharyngeal erythema.  Cardiovascular:     Rate and Rhythm: Normal rate and regular rhythm.  Pulmonary:     Effort: Pulmonary effort is normal.     Breath sounds: Normal breath sounds.  Skin:    General: Skin is warm.  Neurological:     General: No focal deficit present.     Mental Status: He is alert and oriented to person, place, and time.  Psychiatric:        Mood and  Affect: Mood normal.        Behavior: Behavior normal.     BP 139/73   Pulse 84   Temp 98.5 F (36.9 C) (Temporal)   Resp 20   Ht 6' 1"$  (1.854 m)   Wt 177 lb (80.3 kg)   SpO2 97%   BMI 23.35 kg/m        Assessment & Plan:   Dalton Vaughan in today with chief complaint of Chest congestion and Cough (No fever/)   1. URI with cough and congestion 1. Take meds as prescribed 2. Use a cool mist humidifier especially during the winter months and when heat has been humid. 3. Use saline nose sprays frequently 4. Saline irrigations of the nose can be very helpful if done frequently.  * 4X daily for 1 week*  * Use of a nettie pot can be helpful with this. Follow directions with this* 5. Drink plenty of fluids 6. Keep thermostat turn down low 7.For any cough or congestion- mucinex 8. For fever or aces or pains- take tylenol or ibuprofen appropriate for age and weight.  * for fevers greater than 101 orally you may alternate ibuprofen and tylenol every  3 hours.       The above assessment and management plan was discussed with the patient. The patient verbalized understanding of and has agreed to the management plan. Patient is aware to call the clinic if symptoms persist or  worsen. Patient is aware when to return to the clinic for a follow-up visit. Patient educated on when it is appropriate to go to the emergency department.   Mary-Margaret Hassell Done, FNP

## 2023-01-25 NOTE — Patient Instructions (Signed)

## 2023-01-28 ENCOUNTER — Telehealth: Payer: Self-pay | Admitting: Family Medicine

## 2023-01-28 MED ORDER — DOXYCYCLINE HYCLATE 100 MG PO TABS: 100.0000 mg | ORAL_TABLET | Freq: Two times a day (BID) | ORAL | 0 refills | Status: DC

## 2023-01-28 NOTE — Telephone Encounter (Signed)
Sent  in doxycycline -  no need for appointment tomorrow unless feeling worse

## 2023-01-28 NOTE — Telephone Encounter (Signed)
Sent in doxycycloine prescription-  no need to be seen tomorrow unless worsening Meds ordered this encounter  Medications   doxycycline (VIBRA-TABS) 100 MG tablet    Sig: Take 1 tablet (100 mg total) by mouth 2 (two) times daily. 1 po bid    Dispense:  20 tablet    Refill:  0    Order Specific Question:   Supervising Provider    Answer:   Worthy Rancher [id number]   [place], FNP

## 2023-01-29 ENCOUNTER — Encounter: Payer: Self-pay | Admitting: Nurse Practitioner

## 2023-01-29 ENCOUNTER — Ambulatory Visit (INDEPENDENT_AMBULATORY_CARE_PROVIDER_SITE_OTHER): Payer: Medicare Other | Admitting: Nurse Practitioner

## 2023-01-29 VITALS — BP 124/67 | HR 75 | Temp 97.8°F | Resp 20 | Ht 73.0 in | Wt 176.0 lb

## 2023-01-29 DIAGNOSIS — J40 Bronchitis, not specified as acute or chronic: Secondary | ICD-10-CM | POA: Diagnosis not present

## 2023-01-29 DIAGNOSIS — R0981 Nasal congestion: Secondary | ICD-10-CM | POA: Diagnosis not present

## 2023-01-29 MED ORDER — PREDNISONE 20 MG PO TABS: 40.0000 mg | ORAL_TABLET | Freq: Every day | ORAL | 0 refills | Status: AC

## 2023-01-29 MED ORDER — BENZONATATE 100 MG PO CAPS: 100.0000 mg | ORAL_CAPSULE | Freq: Three times a day (TID) | ORAL | 0 refills | Status: DC | PRN

## 2023-01-29 NOTE — Patient Instructions (Signed)

## 2023-01-29 NOTE — Progress Notes (Signed)
Subjective:    Patient ID: Dalton Vaughan, male    DOB: [birth year], 89 y.o.   MRN: [id number]   Chief Complaint: Nasal Congestion   HPI Patient was seen on 01/25/23 with uri with cough and congestion was assumed viral and OTC symptomatic treatment was suggested. He called yesterday stating he was still no better. Still has productive cough and nasal congestion.     Review of Systems  Constitutional:  Negative for chills and fever.  HENT:  Positive for congestion and rhinorrhea. Negative for sinus pressure.   Respiratory:  Positive for cough. Negative for shortness of breath and wheezing.   Gastrointestinal: Negative.   Neurological: Negative.   Psychiatric/Behavioral: Negative.         Objective:   Physical Exam Constitutional:      Appearance: Normal appearance.  HENT:     Right Ear: Tympanic membrane normal.     Left Ear: Tympanic membrane normal.     Nose: Congestion and rhinorrhea present.     Mouth/Throat:     Pharynx: No oropharyngeal exudate or posterior oropharyngeal erythema.  Cardiovascular:     Rate and Rhythm: Normal rate and regular rhythm.  Pulmonary:     Effort: Pulmonary effort is normal.     Breath sounds: Rhonchi (throughout) present.  Musculoskeletal:     Cervical back: Normal range of motion and neck supple.  Skin:    General: Skin is warm.  Neurological:     General: No focal deficit present.     Mental Status: He is alert and oriented to person, place, and time.     BP 124/67   Pulse 75   Temp 97.8 F (36.6 C) (Temporal)   Resp 20   Ht 6' 1"$  (1.854 m)   Wt 176 lb (79.8 kg)   SpO2 96%   BMI 23.22 kg/m        Assessment & Plan:   Dalton Vaughan in today with chief complaint of Nasal Congestion   1. Bronchitis 1. Take meds as prescribed 2. Use a cool mist humidifier especially during the winter months and when heat has been humid. 3. Use saline nose sprays frequently 4. Saline irrigations of the nose can be very helpful if done  frequently.  * 4X daily for 1 week*  * Use of a nettie pot can be helpful with this. Follow directions with this* 5. Drink plenty of fluids 6. Keep thermostat turn down low 7.For any cough or congestion- tessalon [perles 8. For fever or aces or pains- take tylenol or ibuprofen appropriate for age and weight.  * for fevers greater than 101 orally you may alternate ibuprofen and tylenol every  3 hours.   Meds ordered this encounter  Medications   predniSONE (DELTASONE) 20 MG tablet    Sig: Take 2 tablets (40 mg total) by mouth daily with breakfast for 5 days. 2 po daily for 5 days    Dispense:  10 tablet    Refill:  0    Order Specific Question:   Supervising Provider    Answer:   Caryl Pina A [id number]   benzonatate (TESSALON PERLES) 100 MG capsule    Sig: Take 1 capsule (100 mg total) by mouth 3 (three) times daily as needed for cough.    Dispense:  20 capsule    Refill:  0    Order Specific Question:   Supervising Provider    Answer:   Caryl Pina A [id number]   Take doxycycline that was prescribed  yesterday    The above assessment and management plan was discussed with the patient. The patient verbalized understanding of and has agreed to the management plan. Patient is aware to call the clinic if symptoms persist or worsen. Patient is aware when to return to the clinic for a follow-up visit. Patient educated on when it is appropriate to go to the emergency department.   Dalton Vaughan Done, FNP

## 2023-02-03 ENCOUNTER — Other Ambulatory Visit: Payer: Self-pay | Admitting: Family Medicine

## 2023-02-03 DIAGNOSIS — I10 Essential (primary) hypertension: Secondary | ICD-10-CM

## 2023-02-11 ENCOUNTER — Other Ambulatory Visit: Payer: Self-pay | Admitting: Family Medicine

## 2023-02-12 ENCOUNTER — Ambulatory Visit (INDEPENDENT_AMBULATORY_CARE_PROVIDER_SITE_OTHER): Payer: Medicare Other | Admitting: Family Medicine

## 2023-02-12 ENCOUNTER — Encounter: Payer: Self-pay | Admitting: Family Medicine

## 2023-02-12 VITALS — BP 103/61 | HR 92 | Temp 98.6°F | Ht 73.0 in | Wt 167.0 lb

## 2023-02-12 DIAGNOSIS — J3489 Other specified disorders of nose and nasal sinuses: Secondary | ICD-10-CM | POA: Diagnosis not present

## 2023-02-12 DIAGNOSIS — R791 Abnormal coagulation profile: Secondary | ICD-10-CM

## 2023-02-12 DIAGNOSIS — D696 Thrombocytopenia, unspecified: Secondary | ICD-10-CM | POA: Diagnosis not present

## 2023-02-12 DIAGNOSIS — Z7901 Long term (current) use of anticoagulants: Secondary | ICD-10-CM | POA: Diagnosis not present

## 2023-02-12 DIAGNOSIS — I482 Chronic atrial fibrillation, unspecified: Secondary | ICD-10-CM

## 2023-02-12 LAB — COAGUCHEK XS/INR WAIVED
INR: 3.8 — ABNORMAL HIGH (ref 0.9–1.1)
Prothrombin Time: 45.3 s

## 2023-02-12 LAB — CBC
Hematocrit: 43.1 % (ref 37.5–51.0)
Hemoglobin: 14 g/dL (ref 13.0–17.7)
MCH: 30 pg (ref 26.6–33.0)
MCHC: 32.5 g/dL (ref 31.5–35.7)
MCV: 93 fL (ref 79–97)
Platelets: 182 10*3/uL (ref 150–450)
RBC: 4.66 x10E6/uL (ref 4.14–5.80)
RDW: 13.5 % (ref 11.6–15.4)
WBC: 6.8 10*3/uL (ref 3.4–10.8)

## 2023-02-12 LAB — POCT INR: INR: 3.8 — AB (ref 2–3)

## 2023-02-12 MED ORDER — AZELASTINE HCL 0.1 % NA SOLN: 1.0000 | Freq: Two times a day (BID) | NASAL | 12 refills | Status: DC

## 2023-02-12 NOTE — Progress Notes (Signed)
Subjective: [id number] PCP: [name], DO HPI:Dalton Vaughan is a 89 y.o. male presenting to clinic today for:  1. Afib Goal inr 2-3 Patient reports compliance with Coumadin.  He takes 1/2 tablet on Monday and Friday and 1 tablet daily all others.   Recently treated with doxycycline.  Continues to have some rhinorrhea.  No reports of chest pain, shortness of breath or bleeding   ROS: Per HPI  Allergies  Allergen Reactions   Paxil [Paroxetine Hcl] Palpitations   Codeine Other (See Comments)   Eliquis [Apixaban] Other (See Comments)    dizziness   Penicillins Other (See Comments)    Did it involve swelling of the face/tongue/throat, SOB, or low BP? Unknown Did it involve sudden or severe rash/hives, skin peeling, or any reaction on the inside of your mouth or nose? Unknown Did you need to seek medical attention at a hospital or doctor's office? Unknown When did it last happen?      unk If all above answers are "NO", may proceed with cephalosporin use.    Pravachol [Pravastatin Sodium] Other (See Comments)    myalgias   Zetia [Ezetimibe] Other (See Comments)    myalgias   Vytorin [Ezetimibe-Simvastatin] Other (See Comments)    myalgias   Past Medical History:  Diagnosis Date   Anal fissure    Arthritis    Atrial fibrillation (HCC)    BPH (benign prostatic hypertrophy)    CAD (coronary artery disease)    Dr. Wynonia Lawman   Cataract    COVID-19    Depression 2004   Diverticulosis    GERD (gastroesophageal reflux disease)    History of TIAs    Hyperlipidemia    Hypertension    Hypertensive heart disease without CHF    Hypothyroidism    Internal hemorrhoids    Lumbar disc disease    Oral bleeding 08/20/2012   Following molar tooth extraction July, 2013   Pneumonia    Ruptured disk 1995   Shingles    Thyroid disease    Vitamin D deficiency    Vitreous detachment     Current Outpatient Medications:    albuterol (VENTOLIN HFA) 108 (90 Base) MCG/ACT inhaler, Inhale  2 puffs into the lungs every 4 (four) hours as needed., Disp: , Rfl:    amLODipine (NORVASC) 5 MG tablet, TAKE 1 TABLET DAILY, Disp: 90 tablet, Rfl: 0   benzonatate (TESSALON PERLES) 100 MG capsule, Take 1 capsule (100 mg total) by mouth 3 (three) times daily as needed for cough., Disp: 20 capsule, Rfl: 0   Calcium Carb-Cholecalciferol (CALCIUM + D3) 600-200 MG-UNIT TABS, Take 1 tablet by mouth daily. , Disp: , Rfl:    cetirizine (ZYRTEC ALLERGY) 10 MG tablet, Take 0.5 tablets (5 mg total) by mouth at bedtime as needed for allergies (drainage)., Disp: 90 tablet, Rfl: 3   Cholecalciferol (VITAMIN D-3) 5000 UNITS TABS, Take 1 tablet by mouth daily., Disp: , Rfl:    doxazosin (CARDURA) 8 MG tablet, Take 1 tablet (8 mg total) by mouth at bedtime., Disp: 90 tablet, Rfl: 3   lidocaine (XYLOCAINE) 5 % ointment, Apply 1 application topically as needed., Disp: 35.44 g, Rfl: 0   linaclotide (LINZESS) 145 MCG CAPS capsule, Take 1 capsule (145 mcg total) by mouth daily before breakfast., Disp: 8 capsule, Rfl: 0   mirabegron ER (MYRBETRIQ) 25 MG TB24 tablet, Take 1 tablet (25 mg total) by mouth daily., Disp: 45 tablet, Rfl: 0   mirtazapine (REMERON) 15 MG tablet, TAKE 1  TABLET AT BEDTIME, Disp: 90 tablet, Rfl: 0   Multiple Vitamin (MULTIVITAMIN) tablet, Take 1 tablet by mouth daily., Disp: , Rfl:    ondansetron (ZOFRAN-ODT) 4 MG disintegrating tablet, Take 1 tablet (4 mg total) by mouth every 8 (eight) hours as needed for nausea or vomiting., Disp: 20 tablet, Rfl: 0   ramipril (ALTACE) 10 MG capsule, TAKE 1 CAPSULE DAILY, Disp: 90 capsule, Rfl: 3   rosuvastatin (CRESTOR) 5 MG tablet, Take 1 tablet by mouth Mondays, Wednesdays and Fridays, Disp: 36 tablet, Rfl: 3   SIMETHICONE PO, Take by mouth as needed., Disp: , Rfl:    SYNTHROID 50 MCG tablet, TAKE 1 TABLET DAILY BEFORE BREAKFAST, Disp: 90 tablet, Rfl: 3   triamterene-hydrochlorothiazide (MAXZIDE-25) 37.5-25 MG tablet, Take 1 tablet by mouth daily., Disp: 90  tablet, Rfl: 3   vitamin C (ASCORBIC ACID) 500 MG tablet, Take 500 mg by mouth daily. , Disp: , Rfl:    warfarin (COUMADIN) 5 MG tablet, TAKE 1 TABLET DAILY AS DIRECTED, Disp: 90 tablet, Rfl: 3   XIIDRA 5 % SOLN, Apply 1 drop to eye as needed. 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  Tobacco Use   Smoking status: Former    Packs/day: 1.00    Years: 18.00    Total pack years: 18.00    Types: Cigarettes    Start date: 12/17/1948    Quit date: 12/17/1966    Years since quitting: 56.1   Smokeless tobacco: Never  Vaping Use   Vaping Use: Never used  Substance and Sexual Activity   Alcohol use: Yes    Alcohol/week: 1.0 standard drink of alcohol    Types: 1 Standard drinks or equivalent per week    Comment: rare   Drug use: No   Sexual activity: Not on file  Other Topics Concern   Not on file  Social History Narrative   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, music at [employer]      Right-handed      Caffeine use: none      Son and grandson live with him   Social Determinants of Health   Financial Resource Strain: Low Risk  (09/05/2021)   Overall Financial Resource Strain (CARDIA)    Difficulty of Paying Living Expenses: Not hard at all  Food Insecurity: No Food Insecurity (09/05/2021)   Hunger Vital Sign    Worried About Running Out of Food in the Last Year: Never true    [place] in the Last Year: Never true  Transportation Needs: No Transportation Needs (09/05/2021)   PRAPARE - Hydrologist (Medical): No    Lack of Transportation (Non-Medical): No  Physical Activity: Sufficiently Active (09/05/2021)   Exercise Vital  Sign    Days of Exercise per Week: 5 days    Minutes of Exercise per Session: 50 min  Stress: No Stress Concern Present (09/05/2021)   [place]    Feeling of Stress : Only a little  Social Connections: Moderately Isolated (09/05/2021)  Social Licensed conveyancer [NHANES]    Frequency of Communication with Friends and Family: More than three times a week    Frequency of Social Gatherings with Friends and Family: More than three times a week    Attends Religious Services: Never    Marine scientist or Organizations: Yes    Attends Archivist Meetings: 1 to 4 times per year    Marital Status: Widowed  Intimate Partner Violence: Not At Risk (09/05/2021)   Humiliation, Afraid, Rape, and Kick questionnaire    Fear of Current or Ex-Partner: No    Emotionally Abused: No    Physically Abused: No    Sexually Abused: No   Family History  Problem Relation Age of Onset   Dementia Mother    Arthritis Mother    Cancer Brother        prostate   Asthma Sister    Heart disease Paternal Aunt    Heart disease Paternal Uncle    Hypertension Maternal Grandmother    CVA Maternal Grandmother    CVA Paternal Grandmother    Hepatitis C Son    Arthritis Son    Colon cancer Neg Hx    Colon polyps Neg Hx    Kidney disease Neg Hx    Esophageal cancer Neg Hx    Gallbladder disease Neg Hx    Diabetes Neg Hx     Objective: Office vital signs reviewed. BP 103/61   Pulse 92   Temp 98.6 F (37 C)   Ht '6\' 1"'$  (1.854 m)   Wt 167 lb (75.8 kg)   SpO2 97%   BMI 22.03 kg/m   Physical Examination:  General: Awake, alert, well nourished, elderly male. No acute distress HEENT: sclera white, MMM. No gross nasal discharge appreciated Cardio: regular rate and rhythm, S1S2 heard, no murmurs appreciated Pulm: clear to auscultation bilaterally, no wheezes, rhonchi or rales; normal work of breathing on room  air  Assessment/ Plan: 89 y.o. male   Supratherapeutic INR - Plan: POCT INR  Chronic atrial fibrillation ([place]) - Plan: CoaguChek XS/INR Waived, POCT INR  Chronic anticoagulation - Plan: CoaguChek XS/INR Waived, POCT INR  Thrombocytopenia (HCC) - Plan: CBC, POCT INR  Rhinorrhea - Plan: azelastine (ASTELIN) 0.1 % nasal spray  INR supratherapeutic at 3.8 today.  I would like him to skip today's 1 mg dose and tomorrow's 1 mg dose then resume normal dosing schedule.  I suspect that the supratherapeutic INR is related to use of doxycycline.    Check CBC given history of thrombocytopenia and chronic anticoagulation.  Astelin nasal spray sent to pharmacy for rhinorrhea.  If symptoms are refractory, we could consider use of oral antihistamine  We will follow-up closely again in 2 weeks for recheck, sooner if concerns arise.  No orders of the defined types were placed in this encounter.  No orders of the defined types were placed in this encounter.    [name], DO [place] [phone]

## 2023-02-18 ENCOUNTER — Telehealth: Payer: Self-pay | Admitting: Family Medicine

## 2023-02-18 NOTE — Telephone Encounter (Signed)
Contacted Daryus Dirden to schedule their annual wellness visit. Appointment made for 03/05/2023. Thank you,  Colletta [place],  [facility] Direct Dial ??[phone]

## 2023-02-27 ENCOUNTER — Encounter: Payer: Self-pay | Admitting: Family Medicine

## 2023-02-27 ENCOUNTER — Ambulatory Visit (INDEPENDENT_AMBULATORY_CARE_PROVIDER_SITE_OTHER): Payer: Medicare Other | Admitting: Family Medicine

## 2023-02-27 VITALS — BP 136/77 | HR 72 | Temp 98.4°F | Ht 73.0 in | Wt 170.0 lb

## 2023-02-27 DIAGNOSIS — I482 Chronic atrial fibrillation, unspecified: Secondary | ICD-10-CM

## 2023-02-27 DIAGNOSIS — R1032 Left lower quadrant pain: Secondary | ICD-10-CM | POA: Diagnosis not present

## 2023-02-27 DIAGNOSIS — Z7901 Long term (current) use of anticoagulants: Secondary | ICD-10-CM | POA: Diagnosis not present

## 2023-02-27 DIAGNOSIS — J3489 Other specified disorders of nose and nasal sinuses: Secondary | ICD-10-CM

## 2023-02-27 LAB — COAGUCHEK XS/INR WAIVED
INR: 3 — ABNORMAL HIGH (ref 0.9–1.1)
Prothrombin Time: 35.6 s

## 2023-02-27 MED ORDER — AZELASTINE HCL 0.1 % NA SOLN: 1.0000 | Freq: Two times a day (BID) | NASAL | 4 refills | Status: DC

## 2023-02-27 NOTE — Progress Notes (Signed)
Subjective: CC:INR check PCP: [name], DO HPI:Dalton Vaughan is a 89 y.o. male presenting to clinic today for:  1. Afib INR goal 2-3 Supratherapeutic last visit at 3.8. advised to skip 2x '1mg'$  dose then resume normal dosing schedule.  He denies any rectal bleeding.  Overall he seems to be feeling fine.  Has an heart appointment with his doctor next month.  He does report that since her last visit he did have an episode of his blood pressure dropping.  It is not recurred since that time.  He is not sure if it was due to his acute illness and need for antibiotics and steroids.  2.  Left lower quadrant pain. Patient reports intermittent left lower quadrant pain has been ongoing for years now.  He has seen Dr. Hilarie Fredrickson x 3 for this and at some point was told he had moderate amounts of stool on that left side and was given a bowel regimen.  He feels that he has normal bowel movements and has not tried repeating this cocktail.  He notes that the pain will occur, be moderate for a while and then gradually get better on its own.   ROS: Per HPI  Allergies  Allergen Reactions   Paxil [Paroxetine Hcl] Palpitations   Codeine Other (See Comments)   Eliquis [Apixaban] Other (See Comments)    dizziness   Penicillins Other (See Comments)    Did it involve swelling of the face/tongue/throat, SOB, or low BP? Unknown Did it involve sudden or severe rash/hives, skin peeling, or any reaction on the inside of your mouth or nose? Unknown Did you need to seek medical attention at a hospital or doctor's office? Unknown When did it last happen?      unk If all above answers are "NO", may proceed with cephalosporin use.    Pravachol [Pravastatin Sodium] Other (See Comments)    myalgias   Zetia [Ezetimibe] Other (See Comments)    myalgias   Vytorin [Ezetimibe-Simvastatin] Other (See Comments)    myalgias   Past Medical History:  Diagnosis Date   Anal fissure    Arthritis    Atrial fibrillation  (HCC)    BPH (benign prostatic hypertrophy)    CAD (coronary artery disease)    Dr. Wynonia Lawman   Cataract    COVID-19    Depression 2004   Diverticulosis    GERD (gastroesophageal reflux disease)    History of TIAs    Hyperlipidemia    Hypertension    Hypertensive heart disease without CHF    Hypothyroidism    Internal hemorrhoids    Lumbar disc disease    Oral bleeding 08/20/2012   Following molar tooth extraction July, 2013   Pneumonia    Ruptured disk 1995   Shingles    Thyroid disease    Vitamin D deficiency    Vitreous detachment     Current Outpatient Medications:    albuterol (VENTOLIN HFA) 108 (90 Base) MCG/ACT inhaler, Inhale 2 puffs into the lungs every 4 (four) hours as needed., Disp: , Rfl:    amLODipine (NORVASC) 5 MG tablet, TAKE 1 TABLET DAILY, Disp: 90 tablet, Rfl: 0   azelastine (ASTELIN) 0.1 % nasal spray, Place 1 spray into both nostrils 2 (two) times daily., Disp: 30 mL, Rfl: 12   benzonatate (TESSALON PERLES) 100 MG capsule, Take 1 capsule (100 mg total) by mouth 3 (three) times daily as needed for cough., Disp: 20 capsule, Rfl: 0   Calcium Carb-Cholecalciferol (CALCIUM + D3)  600-200 MG-UNIT TABS, Take 1 tablet by mouth daily. , Disp: , Rfl:    cetirizine (ZYRTEC ALLERGY) 10 MG tablet, Take 0.5 tablets (5 mg total) by mouth at bedtime as needed for allergies (drainage)., Disp: 90 tablet, Rfl: 3   Cholecalciferol (VITAMIN D-3) 5000 UNITS TABS, Take 1 tablet by mouth daily., Disp: , Rfl:    doxazosin (CARDURA) 8 MG tablet, Take 1 tablet (8 mg total) by mouth at bedtime., Disp: 90 tablet, Rfl: 3   lidocaine (XYLOCAINE) 5 % ointment, Apply 1 application topically as needed., Disp: 35.44 g, Rfl: 0   linaclotide (LINZESS) 145 MCG CAPS capsule, Take 1 capsule (145 mcg total) by mouth daily before breakfast., Disp: 8 capsule, Rfl: 0   mirabegron ER (MYRBETRIQ) 25 MG TB24 tablet, Take 1 tablet (25 mg total) by mouth daily., Disp: 45 tablet, Rfl: 0   mirtazapine (REMERON)  15 MG tablet, TAKE 1 TABLET AT BEDTIME, Disp: 90 tablet, Rfl: 0   Multiple Vitamin (MULTIVITAMIN) tablet, Take 1 tablet by mouth daily., Disp: , Rfl:    ondansetron (ZOFRAN-ODT) 4 MG disintegrating tablet, Take 1 tablet (4 mg total) by mouth every 8 (eight) hours as needed for nausea or vomiting., Disp: 20 tablet, Rfl: 0   ramipril (ALTACE) 10 MG capsule, TAKE 1 CAPSULE DAILY, Disp: 90 capsule, Rfl: 3   rosuvastatin (CRESTOR) 5 MG tablet, Take 1 tablet by mouth Mondays, Wednesdays and Fridays, Disp: 36 tablet, Rfl: 3   SIMETHICONE PO, Take by mouth as needed., Disp: , Rfl:    SYNTHROID 50 MCG tablet, TAKE 1 TABLET DAILY BEFORE BREAKFAST, Disp: 90 tablet, Rfl: 3   triamterene-hydrochlorothiazide (MAXZIDE-25) 37.5-25 MG tablet, Take 1 tablet by mouth daily., Disp: 90 tablet, Rfl: 3   vitamin C (ASCORBIC ACID) 500 MG tablet, Take 500 mg by mouth daily. , Disp: , Rfl:    warfarin (COUMADIN) 5 MG tablet, TAKE 1 TABLET DAILY AS DIRECTED, Disp: 90 tablet, Rfl: 3   XIIDRA 5 % SOLN, Apply 1 drop to eye as needed. 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  Tobacco Use   Smoking status: Former    Packs/day: 1.00    Years: 18.00    Total pack years: 18.00    Types: Cigarettes    Start date: 12/17/1948    Quit date: 12/17/1966    Years since quitting: 56.2   Smokeless tobacco: Never  Vaping Use   Vaping Use: Never used  Substance and Sexual Activity   Alcohol use: Yes    Alcohol/week: 1.0 standard drink of alcohol    Types: 1 Standard drinks or equivalent per week    Comment: rare   Drug use: No   Sexual activity: Not on file  Other Topics Concern   Not on file  Social History  Narrative   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, music at [employer]      Right-handed      Caffeine use: none      Son and grandson live with him   Social Determinants of Health   Financial Resource Strain: Low Risk  (  09/05/2021)   Overall Financial Resource Strain (CARDIA)    Difficulty of Paying Living Expenses: Not hard at all  Food Insecurity: No Food Insecurity (09/05/2021)   Hunger Vital Sign    Worried About Running Out of Food in the Last Year: Never true    Ran Out of Food in the Last Year: Never true  Transportation Needs: No Transportation Needs (09/05/2021)   PRAPARE - Hydrologist (Medical): No    Lack of Transportation (Non-Medical): No  Physical Activity: Sufficiently Active (09/05/2021)   Exercise Vital Sign    Days of Exercise per Week: 5 days    Minutes of Exercise per Session: 50 min  Stress: No Stress Concern Present (09/05/2021)   [place]    Feeling of Stress : Only a little  Social Connections: Moderately Isolated (09/05/2021)   Social Connection and Isolation Panel [NHANES]    Frequency of Communication with Friends and Family: More than three times a week    Frequency of Social Gatherings with Friends and Family: More than three times a week    Attends Religious Services: Never    Marine scientist or Organizations: Yes    Attends Archivist Meetings: 1 to 4 times per year    Marital Status: Widowed  Intimate Partner Violence: Not At Risk (09/05/2021)   Humiliation, Afraid, Rape, and Kick questionnaire    Fear of Current or Ex-Partner: No    Emotionally Abused: No    Physically Abused: No    Sexually Abused: No   Family History  Problem Relation Age of Onset   Dementia Mother    Arthritis Mother    Cancer Brother        prostate   Asthma Sister    Heart disease Paternal Aunt    Heart disease Paternal Uncle    Hypertension  Maternal Grandmother    CVA Maternal Grandmother    CVA Paternal Grandmother    Hepatitis C Son    Arthritis Son    Colon cancer Neg Hx    Colon polyps Neg Hx    Kidney disease Neg Hx    Esophageal cancer Neg Hx    Gallbladder disease Neg Hx    Diabetes Neg Hx     Objective: Office vital signs reviewed. BP 136/77   Pulse 72   Temp 98.4 F (36.9 C)   Ht '6\' 1"'$  (1.854 m)   Wt 170 lb (77.1 kg)   SpO2 94%   BMI 22.43 kg/m   Physical Examination:  General: Awake, alert, well nourished, No acute distress HEENT: sclera white, MMM Cardio: regular rate and rhythm, S1S2 heard, no murmurs appreciated Pulm: clear to auscultation bilaterally, no wheezes, rhonchi or rales; normal work of breathing on room air GI: no TTP LLQ. No rebound or guarding  Assessment/ Plan: 89 y.o. male   Chronic atrial fibrillation (HCC) - Plan: CoaguChek XS/INR Waived  Chronic anticoagulation - Plan: CoaguChek XS/INR Waived  LLQ abdominal pain  Rhinorrhea - Plan: azelastine (ASTELIN) 0.1 % nasal spray  INR is 3.0 today and within appropriate range.  No changes.  Advised to repeat bowel regimen as directed by GI if this has been helpful for left lower quadrant pain in the past  Did not really discuss rhinorrhea but Astelin working well so renew this for him  No orders of the defined types were placed in this encounter.  No orders of the defined types  were placed in this encounter.    [name], DO [place] [phone]

## 2023-03-05 ENCOUNTER — Ambulatory Visit (INDEPENDENT_AMBULATORY_CARE_PROVIDER_SITE_OTHER): Payer: Medicare Other

## 2023-03-05 VITALS — Ht 72.0 in | Wt 172.0 lb

## 2023-03-05 DIAGNOSIS — Z0000 Encounter for general adult medical examination without abnormal findings: Secondary | ICD-10-CM

## 2023-03-05 NOTE — Progress Notes (Signed)
Subjective:   Dalton Vaughan is a 89 y.o. male who presents for Medicare Annual/Subsequent preventive examination. I connected with  Jhoel Rodas on 03/05/23 by a audio enabled telemedicine application and verified that I am speaking with the correct person using two identifiers.  Patient Location: Home  Provider Location: Home Office  I discussed the limitations of evaluation and management by telemedicine. The patient expressed understanding and agreed to proceed.  Review of Systems     Cardiac Risk Factors include: advanced age (>40men, >50 women);male gender;hypertension;dyslipidemia     Objective:    Today's Vitals   03/05/23 1159  Weight: 172 lb (78 kg)  Height: 6' (1.829 m)   Body mass index is 23.33 kg/m.     03/05/2023   12:02 PM 07/04/2022    8:55 AM 10/24/2021    9:39 AM 09/05/2021   10:57 AM 02/01/2021    8:46 AM 04/13/2020   10:43 AM 01/12/2020   10:58 AM  Advanced Directives  Does Patient Have a Medical Advance Directive? Yes Yes Yes Yes Yes Yes Yes  Type of Paramedic of [place];Living will   [place];Living will  [place] in Chart? No - copy requested   No - copy requested       Current Medications (verified) Outpatient Encounter Medications as of 03/05/2023  Medication Sig   albuterol (VENTOLIN HFA) 108 (90 Base) MCG/ACT inhaler Inhale 2 puffs into the lungs every 4 (four) hours as needed.   amLODipine (NORVASC) 5 MG tablet TAKE 1 TABLET DAILY   azelastine (ASTELIN) 0.1 % nasal spray Place 1 spray into both nostrils 2 (two) times daily.   benzonatate (TESSALON PERLES) 100 MG capsule Take 1 capsule (100 mg total) by mouth 3 (three) times daily as needed for cough.   Calcium Carb-Cholecalciferol (CALCIUM + D3) 600-200 MG-UNIT TABS Take 1 tablet by mouth daily.    cetirizine (ZYRTEC ALLERGY) 10 MG tablet Take 0.5 tablets (5 mg total) by mouth at bedtime as  needed for allergies (drainage).   Cholecalciferol (VITAMIN D-3) 5000 UNITS TABS Take 1 tablet by mouth daily.   doxazosin (CARDURA) 8 MG tablet Take 1 tablet (8 mg total) by mouth at bedtime.   lidocaine (XYLOCAINE) 5 % ointment Apply 1 application topically as needed.   linaclotide (LINZESS) 145 MCG CAPS capsule Take 1 capsule (145 mcg total) by mouth daily before breakfast.   mirabegron ER (MYRBETRIQ) 25 MG TB24 tablet Take 1 tablet (25 mg total) by mouth daily.   mirtazapine (REMERON) 15 MG tablet TAKE 1 TABLET AT BEDTIME   Multiple Vitamin (MULTIVITAMIN) tablet Take 1 tablet by mouth daily.   ondansetron (ZOFRAN-ODT) 4 MG disintegrating tablet Take 1 tablet (4 mg total) by mouth every 8 (eight) hours as needed for nausea or vomiting.   ramipril (ALTACE) 10 MG capsule TAKE 1 CAPSULE DAILY   rosuvastatin (CRESTOR) 5 MG tablet Take 1 tablet by mouth Mondays, Wednesdays and Fridays   SIMETHICONE PO Take by mouth as needed.   SYNTHROID 50 MCG tablet TAKE 1 TABLET DAILY BEFORE BREAKFAST   triamterene-hydrochlorothiazide (MAXZIDE-25) 37.5-25 MG tablet Take 1 tablet by mouth daily.   vitamin C (ASCORBIC ACID) 500 MG tablet Take 500 mg by mouth daily.    warfarin (COUMADIN) 5 MG tablet TAKE 1 TABLET DAILY AS DIRECTED   XIIDRA 5 % SOLN Apply 1 drop to eye as needed.    No facility-administered encounter  medications on file as of 03/05/2023.    Allergies (verified) Paxil [paroxetine hcl], Codeine, Eliquis [apixaban], Penicillins, Pravachol [pravastatin sodium], Zetia [ezetimibe], and Vytorin [ezetimibe-simvastatin]   History: Past Medical History:  Diagnosis Date   Anal fissure    Arthritis    Atrial fibrillation ([place])    BPH (benign prostatic hypertrophy)    CAD (coronary artery disease)    Dr. Wynonia Lawman   Cataract    COVID-19    Depression 2004   Diverticulosis    GERD (gastroesophageal reflux disease)    History of TIAs    Hyperlipidemia    Hypertension    Hypertensive heart disease  without CHF    Hypothyroidism    Internal hemorrhoids    Lumbar disc disease    Oral bleeding 08/20/2012   Following molar tooth extraction July, 2013   Pneumonia    Ruptured disk 1995   Shingles    Thyroid disease    Vitamin D deficiency    Vitreous detachment    Past Surgical History:  Procedure Laterality Date   bilateral cataract surgery  6/07   CARDIAC CATHETERIZATION     CORONARY ARTERY BYPASS GRAFT  12/99    Dr. Servando Snare    cysloscopy  8/08   [place]   right   herninated disc  1995   PACEMAKER INSERTION  11/03/2020   PROSTATE BIOPSY  8/08   TONSILLECTOMY AND ADENOIDECTOMY     Family History  Problem Relation Age of Onset   Dementia Mother    Arthritis Mother    Cancer Brother        prostate   Asthma Sister    Heart disease Paternal Aunt    Heart disease Paternal Uncle    Hypertension Maternal Grandmother    CVA Maternal Grandmother    CVA Paternal Grandmother    Hepatitis C Son    Arthritis Son    Colon cancer Neg Hx    Colon polyps Neg Hx    Kidney disease Neg Hx    Esophageal cancer Neg Hx    Gallbladder disease Neg Hx    Diabetes Neg Hx    Social History   Socioeconomic History   Marital status: Widowed    Spouse name: [name]    Number of children: 1   Years of education: 12   Highest education level: Not on file  Occupational History   Occupation: Retired    Hydrologist: Social research officer, government x 27 years   Occupation: Retired    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  Tobacco Use   Smoking status: Former    Packs/day: 1.00    Years: 18.00    Additional pack years: 0.00    Total pack years: 18.00    Types: Cigarettes    Start date: 12/17/1948    Quit date: 12/17/1966    Years since quitting: 56.2   Smokeless tobacco: Never  Vaping Use   Vaping Use: Never used  Substance and Sexual Activity   Alcohol use: Yes    Alcohol/week: 1.0 standard drink of alcohol    Types: 1 Standard drinks or equivalent per  week    Comment: rare   Drug use: No   Sexual activity: Not on file  Other Topics Concern   Not on file  Social History Narrative   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, music at [employer]      Right-handed  Caffeine use: none      Son and grandson live with him   Social Determinants of Health   Financial Resource Strain: Low Risk  (03/05/2023)   Overall Financial Resource Strain (CARDIA)    Difficulty of Paying Living Expenses: Not hard at all  Food Insecurity: No Food Insecurity (03/05/2023)   Hunger Vital Sign    Worried About Running Out of Food in the Last Year: Never true    Ran Out of Food in the Last Year: Never true  Transportation Needs: No Transportation Needs (03/05/2023)   PRAPARE - Hydrologist (Medical): No    Lack of Transportation (Non-Medical): No  Physical Activity: Sufficiently Active (03/05/2023)   Exercise Vital Sign    Days of Exercise per Week: 5 days    Minutes of Exercise per Session: 60 min  Stress: No Stress Concern Present (03/05/2023)   [place]    Feeling of Stress : Not at all  Social Connections: Socially Isolated (03/05/2023)   Social Connection and Isolation Panel [NHANES]    Frequency of Communication with Friends and Family: More than three times a week    Frequency of Social Gatherings with Friends and Family: More than three times a week    Attends Religious Services: Never    Marine scientist or Organizations: No    Attends Archivist Meetings: Never    Marital Status: Widowed    Tobacco Counseling Counseling given: Not Answered   Clinical Intake:  Pre-visit preparation completed: Yes  Pain : No/denies pain     Nutritional Risks: None Diabetes: No  How often do you need to have someone help you when you read instructions, pamphlets, or other written materials from your doctor or pharmacy?: 1 -  Never  Diabetic?no   Interpreter Needed?: No  Information entered by :: Jadene Pierini, LPN   Activities of Daily Living    03/05/2023   12:02 PM  In your present state of health, do you have any difficulty performing the following activities:  Hearing? 0  Vision? 0  Difficulty concentrating or making decisions? 0  Walking or climbing stairs? 0  Dressing or bathing? 0  Doing errands, shopping? 0  Preparing Food and eating ? N  Using the Toilet? N  In the past six months, have you accidently leaked urine? N  Do you have problems with loss of bowel control? N  Managing your Medications? N  Managing your Finances? N  Housekeeping or managing your Housekeeping? N    Patient Care Team: [name], DO as PCP - General (Family Medicine) Raynelle Bring, MD as Consulting Physician (Urology) Pyrtle, Lajuan Lines, MD as Consulting Physician (Gastroenterology) Clent Jacks, MD as Consulting Physician (Ophthalmology) Elonda Husky, Archie Neita Goodnight., MD as Referring Physician (Cardiology)  Indicate any recent Medical Services you may have received from other than [facility] providers in the past year (date may be approximate).     Assessment:   This is a routine wellness examination for Saurav.  Hearing/Vision screen Vision Screening - Comments:: Wears rx glasses - up to date with routine eye exams with  Dr.groat   Dietary issues and exercise activities discussed: Current Exercise Habits: Home exercise routine, Type of exercise: walking, Time (Minutes): 60, Frequency (Times/Week): 5, Weekly Exercise (Minutes/Week): 300, Intensity: Mild, Exercise limited by: None identified   Goals Addressed             This Visit's  Progress    Patient Stated   On track    Start sleeping all night - try not to drink water a couple hours before bed, try melatonin one hour before bed, avoid electronics or any stimulation before bed       Depression Screen    03/05/2023   12:02 PM 02/27/2023   10:10  AM 02/12/2023    9:55 AM 01/29/2023   12:22 PM 01/25/2023    8:51 AM 01/01/2023   10:05 AM 11/20/2022    9:58 AM  PHQ 2/9 Scores  PHQ - 2 Score 0 0 0 0 0 0 0  PHQ- 9 Score 0 0 0 0 0      Fall Risk    03/05/2023   12:00 PM 02/27/2023   10:10 AM 02/12/2023    9:55 AM 01/29/2023   12:22 PM 01/25/2023    8:51 AM  Fall Risk   Falls in the past year? 0 0 0 0 0  Number falls in past yr: 0 0 0    Injury with Fall? 0 0 0    Risk for fall due to : No Fall Risks No Fall Risks No Fall Risks    Follow up Falls prevention discussed Education provided Education provided      FALL RISK PREVENTION PERTAINING TO THE HOME:  Any stairs in or around the home? No  If so, are there any without handrails? No  Home free of loose throw rugs in walkways, pet beds, electrical cords, etc? Yes  Adequate lighting in your home to reduce risk of falls? Yes   ASSISTIVE DEVICES UTILIZED TO PREVENT FALLS:  Life alert? No  Use of a cane, walker or w/c? No  Grab bars in the bathroom? Yes  Shower chair or bench in shower? Yes  Elevated toilet seat or a handicapped toilet? Yes       01/26/2019    1:34 PM 01/23/2018    8:58 AM 09/01/2015    2:54 PM  MMSE - Mini Mental State Exam  Orientation to time 5 5 5   Orientation to Place 5 5 5   Registration 3 3 3   Attention/ Calculation 5 5 5   Recall 3 3 3   Language- name 2 objects 2 2 2   Language- repeat 1 1 1   Language- follow 3 step command 3 3 3   Language- read & follow direction 1 1 1   Write a sentence 1 1 1   Copy design 1 1 1   Total score 30 30 30         03/05/2023   12:03 PM  6CIT Screen  What Year? 0 points  What month? 0 points  What time? 0 points  Count back from 20 0 points  Months in reverse 0 points  Repeat phrase 0 points  Total Score 0 points    Immunizations Immunization History  Administered Date(s) Administered   Fluad Quad(high Dose 65+) 10/11/2020, 10/18/2021   Influenza, High Dose Seasonal PF 09/15/2013, 09/22/2014, 10/04/2015,  11/05/2016, 10/08/2017, 09/24/2018, 10/19/2019   Influenza,inj,Quad PF,6+ Mos 09/15/2013, 09/22/2014, 10/04/2015   Moderna Sars-Covid-2 Vaccination 02/16/2020, 03/15/2020   Pneumococcal Conjugate-13 01/04/2014   Pneumococcal Polysaccharide-23 12/17/1996, 11/15/2015   Zoster Recombinat (Shingrix) 01/26/2019, 05/06/2019    TDAP status: Due, Education has been provided regarding the importance of this vaccine. Advised may receive this vaccine at local pharmacy or Health Dept. Aware to provide a copy of the vaccination record if obtained from local pharmacy or Health Dept. Verbalized acceptance and understanding.  Flu Vaccine  status: Due, Education has been provided regarding the importance of this vaccine. Advised may receive this vaccine at local pharmacy or Health Dept. Aware to provide a copy of the vaccination record if obtained from local pharmacy or Health Dept. Verbalized acceptance and understanding.  Pneumococcal vaccine status: Up to date  Covid-19 vaccine status: Completed vaccines  Qualifies for Shingles Vaccine? Yes   Zostavax completed Yes   Shingrix Completed?: Yes  Screening Tests Health Maintenance  Topic Date Due   DTaP/Tdap/Td (1 - Tdap) Never done   COVID-19 Vaccine (3 - Moderna risk series) 04/12/2020   INFLUENZA VACCINE  03/17/2023 (Originally 07/17/2022)   Medicare Annual Wellness (AWV)  03/04/2024   Pneumonia Vaccine 31+ Years old  Completed   Zoster Vaccines- Shingrix  Completed   HPV VACCINES  Aged Out    Health Maintenance  Health Maintenance Due  Topic Date Due   DTaP/Tdap/Td (1 - Tdap) Never done   COVID-19 Vaccine (3 - Moderna risk series) 04/12/2020    Colorectal cancer screening: No longer required.   Lung Cancer Screening: (Low Dose CT Chest recommended if Age 31-80 years, 30 pack-year currently smoking OR have quit w/in 15years.) does not qualify.   Lung Cancer Screening Referral: n/a  Additional Screening:  Hepatitis C Screening: does not  qualify;   Vision Screening: Recommended annual ophthalmology exams for early detection of glaucoma and other disorders of the eye. Is the patient up to date with their annual eye exam?  Yes  Who is the provider or what is the name of the office in which the patient attends annual eye exams? Dr.groat  If pt is not established with a provider, would they like to be referred to a provider to establish care? No .   Dental Screening: Recommended annual dental exams for proper oral hygiene  Community Resource Referral / Chronic Care Management: CRR required this visit?  No   CCM required this visit?  No      Plan:     I have personally reviewed and noted the following in the patient's chart:   Medical and social history Use of alcohol, tobacco or illicit drugs  Current medications and supplements including opioid prescriptions. Patient is not currently taking opioid prescriptions. Functional ability and status Nutritional status Physical activity Advanced directives List of other physicians Hospitalizations, surgeries, and ER visits in previous 12 months Vitals Screenings to include cognitive, depression, and falls Referrals and appointments  In addition, I have reviewed and discussed with patient certain preventive protocols, quality metrics, and best practice recommendations. A written personalized care plan for preventive services as well as general preventive health recommendations were provided to patient.     Daphane Shepherd, LPN   [id number]   Nurse Notes: Due Tdap vaccine

## 2023-03-05 NOTE — Patient Instructions (Signed)
Dalton Vaughan , Thank you for taking time to come for your Medicare Wellness Visit. I appreciate your ongoing commitment to your health goals. Please review the following plan we discussed and let me know if I can assist you in the future.   These are the goals we discussed:  Goals      Blood Pressure < 140/90     Continue exercising Do deep breathing exercises       Patient Stated     Start sleeping all night - try not to drink water a couple hours before bed, try melatonin one hour before bed, avoid electronics or any stimulation before bed        This is a list of the screening recommended for you and due dates:  Health Maintenance  Topic Date Due   DTaP/Tdap/Td vaccine (1 - Tdap) Never done   COVID-19 Vaccine (3 - Moderna risk series) 04/12/2020   Flu Shot  03/17/2023*   Medicare Annual Wellness Visit  03/04/2024   Pneumonia Vaccine  Completed   Zoster (Shingles) Vaccine  Completed   HPV Vaccine  Aged Out  *Topic was postponed. The date shown is not the original due date.    Advanced directives: Please bring a copy of your health care power of attorney and living will to the office to be added to your chart at your convenience.   Conditions/risks identified: Aim for 30 minutes of exercise or brisk walking, 6-8 glasses of water, and 5 servings of fruits and vegetables each day.   Next appointment: Follow up in one year for your annual wellness visit.   Preventive Care 48 Years and Older, Male  Preventive care refers to lifestyle choices and visits with your health care provider that can promote health and wellness. What does preventive care include? A yearly physical exam. This is also called an annual well check. Dental exams once or twice a year. Routine eye exams. Ask your health care provider how often you should have your eyes checked. Personal lifestyle choices, including: Daily care of your teeth and gums. Regular physical activity. Eating a healthy  diet. Avoiding tobacco and drug use. Limiting alcohol use. Practicing safe sex. Taking low doses of aspirin every day. Taking vitamin and mineral supplements as recommended by your health care provider. What happens during an annual well check? The services and screenings done by your health care provider during your annual well check will depend on your age, overall health, lifestyle risk factors, and family history of disease. Counseling  Your health care provider may ask you questions about your: Alcohol use. Tobacco use. Drug use. Emotional well-being. Home and relationship well-being. Sexual activity. Eating habits. History of falls. Memory and ability to understand (cognition). Work and work Statistician. Screening  You may have the following tests or measurements: Height, weight, and BMI. Blood pressure. Lipid and cholesterol levels. These may be checked every 5 years, or more frequently if you are over 87 years old. Skin check. Lung cancer screening. You may have this screening every year starting at age 87 if you have a 30-pack-year history of smoking and currently smoke or have quit within the past 15 years. Fecal occult blood test (FOBT) of the stool. You may have this test every year starting at age 87. Flexible sigmoidoscopy or colonoscopy. You may have a sigmoidoscopy every 5 years or a colonoscopy every 10 years starting at age 87. Prostate cancer screening. Recommendations will vary depending on your family history and other risks. Hepatitis  C blood test. Hepatitis B blood test. Sexually transmitted disease (STD) testing. Diabetes screening. This is done by checking your blood sugar (glucose) after you have not eaten for a while (fasting). You may have this done every 1-3 years. Abdominal aortic aneurysm (AAA) screening. You may need this if you are a current or former smoker. Osteoporosis. You may be screened starting at age 87 if you are at high risk. Talk with  your health care provider about your test results, treatment options, and if necessary, the need for more tests. Vaccines  Your health care provider may recommend certain vaccines, such as: Influenza vaccine. This is recommended every year. Tetanus, diphtheria, and acellular pertussis (Tdap, Td) vaccine. You may need a Td booster every 10 years. Zoster vaccine. You may need this after age 87. Pneumococcal 13-valent conjugate (PCV13) vaccine. One dose is recommended after age 87. Pneumococcal polysaccharide (PPSV23) vaccine. One dose is recommended after age 87. Talk to your health care provider about which screenings and vaccines you need and how often you need them. This information is not intended to replace advice given to you by your health care provider. Make sure you discuss any questions you have with your health care provider. Document Released: 12/30/2015 Document Revised: 08/22/2016 Document Reviewed: 10/04/2015 Elsevier Interactive Patient Education  2017 [place] Prevention in the Home Falls can cause injuries. They can happen to people of all ages. There are many things you can do to make your home safe and to help prevent falls. What can I do on the outside of my home? Regularly fix the edges of walkways and driveways and fix any cracks. Remove anything that might make you trip as you walk through a door, such as a raised step or threshold. Trim any bushes or trees on the path to your home. Use bright outdoor lighting. Clear any walking paths of anything that might make someone trip, such as rocks or tools. Regularly check to see if handrails are loose or broken. Make sure that both sides of any steps have handrails. Any raised decks and porches should have guardrails on the edges. Have any leaves, snow, or ice cleared regularly. Use sand or salt on walking paths during winter. Clean up any spills in your garage right away. This includes oil or grease spills. What  can I do in the bathroom? Use night lights. Install grab bars by the toilet and in the tub and shower. Do not use towel bars as grab bars. Use non-skid mats or decals in the tub or shower. If you need to sit down in the shower, use a plastic, non-slip stool. Keep the floor dry. Clean up any water that spills on the floor as soon as it happens. Remove soap buildup in the tub or shower regularly. Attach bath mats securely with double-sided non-slip rug tape. Do not have throw rugs and other things on the floor that can make you trip. What can I do in the bedroom? Use night lights. Make sure that you have a light by your bed that is easy to reach. Do not use any sheets or blankets that are too big for your bed. They should not hang down onto the floor. Have a firm chair that has side arms. You can use this for support while you get dressed. Do not have throw rugs and other things on the floor that can make you trip. What can I do in the kitchen? Clean up any spills right away. Avoid walking  on wet floors. Keep items that you use a lot in easy-to-reach places. If you need to reach something above you, use a strong step stool that has a grab bar. Keep electrical cords out of the way. Do not use floor polish or wax that makes floors slippery. If you must use wax, use non-skid floor wax. Do not have throw rugs and other things on the floor that can make you trip. What can I do with my stairs? Do not leave any items on the stairs. Make sure that there are handrails on both sides of the stairs and use them. Fix handrails that are broken or loose. Make sure that handrails are as long as the stairways. Check any carpeting to make sure that it is firmly attached to the stairs. Fix any carpet that is loose or worn. Avoid having throw rugs at the top or bottom of the stairs. If you do have throw rugs, attach them to the floor with carpet tape. Make sure that you have a light switch at the top of the  stairs and the bottom of the stairs. If you do not have them, ask someone to add them for you. What else can I do to help prevent falls? Wear shoes that: Do not have high heels. Have rubber bottoms. Are comfortable and fit you well. Are closed at the toe. Do not wear sandals. If you use a stepladder: Make sure that it is fully opened. Do not climb a closed stepladder. Make sure that both sides of the stepladder are locked into place. Ask someone to hold it for you, if possible. Clearly mark and make sure that you can see: Any grab bars or handrails. First and last steps. Where the edge of each step is. Use tools that help you move around (mobility aids) if they are needed. These include: Canes. Walkers. Scooters. Crutches. Turn on the lights when you go into a dark area. Replace any light bulbs as soon as they burn out. Set up your furniture so you have a clear path. Avoid moving your furniture around. If any of your floors are uneven, fix them. If there are any pets around you, be aware of where they are. Review your medicines with your doctor. Some medicines can make you feel dizzy. This can increase your chance of falling. Ask your doctor what other things that you can do to help prevent falls. This information is not intended to replace advice given to you by your health care provider. Make sure you discuss any questions you have with your health care provider. Document Released: 09/29/2009 Document Revised: 05/10/2016 Document Reviewed: 01/07/2015 Elsevier Interactive Patient Education  2017 [employer].

## 2023-03-07 DIAGNOSIS — L84 Corns and callosities: Secondary | ICD-10-CM | POA: Diagnosis not present

## 2023-03-07 DIAGNOSIS — B351 Tinea unguium: Secondary | ICD-10-CM | POA: Diagnosis not present

## 2023-03-07 DIAGNOSIS — I70203 Unspecified atherosclerosis of native arteries of extremities, bilateral legs: Secondary | ICD-10-CM | POA: Diagnosis not present

## 2023-03-07 DIAGNOSIS — L603 Nail dystrophy: Secondary | ICD-10-CM | POA: Diagnosis not present

## 2023-03-07 DIAGNOSIS — M79676 Pain in unspecified toe(s): Secondary | ICD-10-CM | POA: Diagnosis not present

## 2023-03-18 ENCOUNTER — Other Ambulatory Visit: Payer: Self-pay | Admitting: Family Medicine

## 2023-03-20 DIAGNOSIS — H0102B Squamous blepharitis left eye, upper and lower eyelids: Secondary | ICD-10-CM | POA: Diagnosis not present

## 2023-03-20 DIAGNOSIS — H02831 Dermatochalasis of right upper eyelid: Secondary | ICD-10-CM | POA: Diagnosis not present

## 2023-03-20 DIAGNOSIS — H02834 Dermatochalasis of left upper eyelid: Secondary | ICD-10-CM | POA: Diagnosis not present

## 2023-03-20 DIAGNOSIS — H0102A Squamous blepharitis right eye, upper and lower eyelids: Secondary | ICD-10-CM | POA: Diagnosis not present

## 2023-03-20 DIAGNOSIS — H11823 Conjunctivochalasis, bilateral: Secondary | ICD-10-CM | POA: Diagnosis not present

## 2023-03-20 DIAGNOSIS — H43811 Vitreous degeneration, right eye: Secondary | ICD-10-CM | POA: Diagnosis not present

## 2023-03-28 ENCOUNTER — Ambulatory Visit (INDEPENDENT_AMBULATORY_CARE_PROVIDER_SITE_OTHER): Payer: Medicare Other | Admitting: Family Medicine

## 2023-03-28 ENCOUNTER — Encounter: Payer: Self-pay | Admitting: Family Medicine

## 2023-03-28 VITALS — BP 161/90 | HR 82 | Temp 97.2°F | Ht 73.0 in | Wt 176.0 lb

## 2023-03-28 DIAGNOSIS — H6123 Impacted cerumen, bilateral: Secondary | ICD-10-CM

## 2023-03-28 NOTE — Progress Notes (Signed)
Subjective:  Patient ID: Dalton Vaughan, male    DOB: 07-Mar-1929, 89 y.o.   MRN: [id number]  Patient Care Team: Raliegh Ip, DO as PCP - General (Family Medicine) Heloise Purpura, MD as Consulting Physician (Urology) Pyrtle, Carie Caddy, MD as Consulting Physician (Gastroenterology) Ernesto Rutherford, MD as Consulting Physician (Ophthalmology) Dyane Dustman Renford Dills., MD as Referring Physician (Cardiology)   Chief Complaint:  stopped up ears   HPI: Dalton Vaughan is a 89 y.o. male presenting on 03/28/2023 for stopped up ears   Pt presents today for evaluation of bilateral ears. States that both ears feels stopped up. He did try to flush them at home with water and peroxide but was unable to get any wax out.      Relevant past medical, surgical, family, and social history reviewed and updated as indicated.  Allergies and medications reviewed and updated. Data reviewed: Chart in Epic.   Past Medical History:  Diagnosis Date   Anal fissure    Arthritis    Atrial fibrillation    BPH (benign prostatic hypertrophy)    CAD (coronary artery disease)    Dr. Donnie Aho   Cataract    COVID-19    Depression 2004   Diverticulosis    GERD (gastroesophageal reflux disease)    History of TIAs    Hyperlipidemia    Hypertension    Hypertensive heart disease without CHF    Hypothyroidism    Internal hemorrhoids    Lumbar disc disease    Oral bleeding 08/20/2012   Following molar tooth extraction July, 2013   Pneumonia    Ruptured disk 1995   Shingles    Thyroid disease    Vitamin D deficiency    Vitreous detachment     Past Surgical History:  Procedure Laterality Date   bilateral cataract surgery  6/07   CARDIAC CATHETERIZATION     CORONARY ARTERY BYPASS GRAFT  12/99    Dr. Tyrone Sage    cysloscopy  8/08   EYE SURGERY     HERNIA REPAIR  1997   right   herninated disc  1995   PACEMAKER INSERTION  11/03/2020   PROSTATE BIOPSY  8/08   TONSILLECTOMY AND ADENOIDECTOMY       Social History   Socioeconomic History   Marital status: Widowed    Spouse name: Harriett Sine    Number of children: 1   Years of education: 12   Highest education level: Not on file  Occupational History   Occupation: Retired    Veterinary surgeon: Company secretary x 27 years   Occupation: Retired    Associate Professor: Public librarian    Comment: supervisor x 17 years  Tobacco Use   Smoking status: Former    Packs/day: 1.00    Years: 18.00    Additional pack years: 0.00    Total pack years: 18.00    Types: Cigarettes    Start date: 12/17/1948    Quit date: 12/17/1966    Years since quitting: 56.3   Smokeless tobacco: Never  Vaping Use   Vaping Use: Never used  Substance and Sexual Activity   Alcohol use: Yes    Alcohol/week: 1.0 standard drink of alcohol    Types: 1 Standard drinks or equivalent per week    Comment: rare   Drug use: No   Sexual activity: Not on file  Other Topics Concern   Not on file  Social History Narrative   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[employer]  [place]      Right-handed      Caffeine use: none      Son and grandson live with him   Social Determinants of Health   Financial Resource Strain: Low Risk  (03/05/2023)   Overall Financial Resource Strain (CARDIA)    Difficulty of Paying Living Expenses: Not hard at all  Food Insecurity: No Food Insecurity (03/05/2023)   Hunger Vital Sign    Worried About Running Out of Food in the Last Year: Never true    Ran Out of Food in the Last Year: Never true  Transportation Needs: No Transportation Needs (03/05/2023)   PRAPARE - Administrator, Civil Service (Medical): No    Lack of Transportation (Non-Medical): No  Physical Activity: Sufficiently Active (03/05/2023)   Exercise Vital Sign    Days of Exercise per Week: 5 days    Minutes of Exercise per Session: 60 min  Stress: No Stress Concern Present (03/05/2023)   [employer] of Occupational Health - Occupational Stress Questionnaire     Feeling of Stress : Not at all  Social Connections: Socially Isolated (03/05/2023)   Social Connection and Isolation Panel [NHANES]    Frequency of Communication with Friends and Family: More than three times a week    Frequency of Social Gatherings with Friends and Family: More than three times a week    Attends Religious Services: Never    Database administrator or Organizations: No    Attends Banker Meetings: Never    Marital Status: Widowed  Intimate Partner Violence: Not At Risk (03/05/2023)   Humiliation, Afraid, Rape, and Kick questionnaire    Fear of Current or Ex-Partner: No    Emotionally Abused: No    Physically Abused: No    Sexually Abused: No    Outpatient Encounter Medications as of 03/28/2023  Medication Sig   albuterol (VENTOLIN HFA) 108 (90 Base) MCG/ACT inhaler Inhale 2 puffs into the lungs every 4 (four) hours as needed.   amLODipine (NORVASC) 5 MG tablet TAKE 1 TABLET DAILY   azelastine (ASTELIN) 0.1 % nasal spray Place 1 spray into both nostrils 2 (two) times daily.   benzonatate (TESSALON PERLES) 100 MG capsule Take 1 capsule (100 mg total) by mouth 3 (three) times daily as needed for cough.   Calcium Carb-Cholecalciferol (CALCIUM + D3) 600-200 MG-UNIT TABS Take 1 tablet by mouth daily.    cetirizine (ZYRTEC ALLERGY) 10 MG tablet Take 0.5 tablets (5 mg total) by mouth at bedtime as needed for allergies (drainage).   Cholecalciferol (VITAMIN D-3) 5000 UNITS TABS Take 1 tablet by mouth daily.   doxazosin (CARDURA) 8 MG tablet Take 1 tablet (8 mg total) by mouth at bedtime.   levothyroxine (SYNTHROID) 50 MCG tablet TAKE 1 TABLET DAILY BEFORE BREAKFAST   lidocaine (XYLOCAINE) 5 % ointment Apply 1 application topically as needed.   linaclotide (LINZESS) 145 MCG CAPS capsule Take 1 capsule (145 mcg total) by mouth daily before breakfast.   mirabegron ER (MYRBETRIQ) 25 MG TB24 tablet Take 1 tablet (25 mg total) by mouth daily.   mirtazapine (REMERON) 15 MG  tablet TAKE 1 TABLET AT BEDTIME   Multiple Vitamin (MULTIVITAMIN) tablet Take 1 tablet by mouth daily.   ondansetron (ZOFRAN-ODT) 4 MG disintegrating tablet Take 1 tablet (4 mg total) by mouth every 8 (eight) hours as needed for nausea or vomiting.   ramipril (ALTACE) 10 MG capsule TAKE 1 CAPSULE DAILY   rosuvastatin (CRESTOR) 5 MG tablet  Take 1 tablet by mouth Mondays, Wednesdays and Fridays   SIMETHICONE PO Take by mouth as needed.   triamterene-hydrochlorothiazide (MAXZIDE-25) 37.5-25 MG tablet Take 1 tablet by mouth daily.   vitamin C (ASCORBIC ACID) 500 MG tablet Take 500 mg by mouth daily.    warfarin (COUMADIN) 5 MG tablet TAKE 1 TABLET DAILY AS DIRECTED   XIIDRA 5 % SOLN Apply 1 drop to eye as needed.    No facility-administered encounter medications on file as of 03/28/2023.    Allergies  Allergen Reactions   Paxil [Paroxetine Hcl] Palpitations   Codeine Other (See Comments)   Eliquis [Apixaban] Other (See Comments)    dizziness   Penicillins Other (See Comments)    Did it involve swelling of the face/tongue/throat, SOB, or low BP? Unknown Did it involve sudden or severe rash/hives, skin peeling, or any reaction on the inside of your mouth or nose? Unknown Did you need to seek medical attention at a hospital or doctor's office? Unknown When did it last happen?      unk If all above answers are "NO", may proceed with cephalosporin use.    Pravachol [Pravastatin Sodium] Other (See Comments)    myalgias   Zetia [Ezetimibe] Other (See Comments)    myalgias   Vytorin [Ezetimibe-Simvastatin] Other (See Comments)    myalgias    Review of Systems  HENT:  Positive for hearing loss.   All other systems reviewed and are negative.       Objective:  BP (!) 161/90   Pulse 82   Temp (!) 97.2 F (36.2 C) (Temporal)   Ht 6\' 1"  (1.854 m)   Wt 176 lb (79.8 kg)   SpO2 96%   BMI 23.22 kg/m    Wt Readings from Last 3 Encounters:  03/28/23 176 lb (79.8 kg)  03/05/23 172 lb (78  kg)  02/27/23 170 lb (77.1 kg)    Physical Exam Vitals and nursing note reviewed.  Constitutional:      General: He is not in acute distress.    Appearance: Normal appearance. He is not ill-appearing, toxic-appearing or diaphoretic.  HENT:     Head: Normocephalic and atraumatic.     Right Ear: There is impacted cerumen.     Left Ear: There is impacted cerumen.     Mouth/Throat:     Mouth: Mucous membranes are moist.  Eyes:     Conjunctiva/sclera: Conjunctivae normal.     Pupils: Pupils are equal, round, and reactive to light.  Cardiovascular:     Rate and Rhythm: Normal rate. Rhythm irregular.     Heart sounds: Normal heart sounds.  Pulmonary:     Effort: Pulmonary effort is normal.     Breath sounds: Normal breath sounds.  Skin:    General: Skin is warm and dry.     Capillary Refill: Capillary refill takes less than 2 seconds.  Neurological:     General: No focal deficit present.     Mental Status: He is alert and oriented to person, place, and time.     Results for orders placed or performed in visit on 02/27/23  CoaguChek XS/INR Waived  Result Value Ref Range   INR 3.0 (H) 0.9 - 1.1   Prothrombin Time 35.6 sec     Ear Cerumen Removal  Date/Time: 03/28/2023 8:09 AM  Performed by: Sonny Masters, FNP Authorized by: Sonny Masters, FNP   Anesthesia: Local Anesthetic: none Location details: right ear Patient tolerance: patient tolerated the procedure well  with no immediate complications Procedure type: irrigation  Sedation: Patient sedated: no    Ear Cerumen Removal  Date/Time: 03/28/2023 8:09 AM  Performed by: Sonny Mastersakes, Channie Bostick M, FNP Authorized by: Sonny Mastersakes, Anthonny Schiller M, FNP   Anesthesia: Local Anesthetic: none Location details: left ear Patient tolerance: patient tolerated the procedure well with no immediate complications Procedure type: irrigation  Sedation: Patient sedated: no       Pertinent labs & imaging results that were available during my care of  the patient were reviewed by me and considered in my medical decision making.  Assessment & Plan:  Rosalia HammersRay was seen today for stopped up ears.  Diagnoses and all orders for this visit:  Bilateral impacted cerumen Bilateral TM normal post cerumen removal. Pt aware to use debrox to keep wax soft so it does not become impacted.  -     Ear Cerumen Removal -     Ear Cerumen Removal    Continue all other maintenance medications.  Follow up plan: Return if symptoms worsen or fail to improve.   Continue healthy lifestyle choices, including diet (rich in fruits, vegetables, and lean proteins, and low in salt and simple carbohydrates) and exercise (at least 30 minutes of moderate physical activity daily).  Educational handout given for earwax buildup  The above assessment and management plan was discussed with the patient. The patient verbalized understanding of and has agreed to the management plan. Patient is aware to call the clinic if they develop any new symptoms or if symptoms persist or worsen. Patient is aware when to return to the clinic for a follow-up visit. Patient educated on when it is appropriate to go to the emergency department.   Kari BaarsMichelle Demetri Kerman, FNP-C [facility] [phone]

## 2023-03-28 NOTE — Patient Instructions (Signed)
Debrox Drops - bilateral ears each night to soften wax.

## 2023-04-16 ENCOUNTER — Encounter: Payer: Self-pay | Admitting: Family Medicine

## 2023-04-16 ENCOUNTER — Ambulatory Visit (INDEPENDENT_AMBULATORY_CARE_PROVIDER_SITE_OTHER): Payer: Medicare Other | Admitting: Family Medicine

## 2023-04-16 ENCOUNTER — Ambulatory Visit (INDEPENDENT_AMBULATORY_CARE_PROVIDER_SITE_OTHER): Payer: Medicare Other

## 2023-04-16 VITALS — BP 151/82 | HR 73 | Temp 98.8°F | Ht 73.0 in | Wt 170.0 lb

## 2023-04-16 DIAGNOSIS — S8012XA Contusion of left lower leg, initial encounter: Secondary | ICD-10-CM | POA: Diagnosis not present

## 2023-04-16 DIAGNOSIS — S8992XA Unspecified injury of left lower leg, initial encounter: Secondary | ICD-10-CM

## 2023-04-16 DIAGNOSIS — S8011XA Contusion of right lower leg, initial encounter: Secondary | ICD-10-CM | POA: Diagnosis not present

## 2023-04-16 DIAGNOSIS — I10 Essential (primary) hypertension: Secondary | ICD-10-CM

## 2023-04-16 DIAGNOSIS — S8991XA Unspecified injury of right lower leg, initial encounter: Secondary | ICD-10-CM | POA: Diagnosis not present

## 2023-04-16 MED ORDER — TRIAMTERENE-HCTZ 37.5-25 MG PO TABS: 1.0000 | ORAL_TABLET | Freq: Every day | ORAL | 0 refills | Status: DC

## 2023-04-16 NOTE — Progress Notes (Signed)
Subjective: CC: Leg injury PCP: Raliegh Ip, DO HPI:Dalton Vaughan is a 89 y.o. male presenting to clinic today for:  1.  Leg injury Patient reports that he suffered a left sided a lower leg injury when he was pulling on a water hose and it swung back at him and hit him in the left shin.  He has a pretty big goose egg now on that left leg and has a bit of discoloration which worried him.  He is anticoagulated.  He has noticed some of the swelling and discoloration moving down into his ankle but he is not having any ankle pain nor did he sustain any injury to the left ankle.  2.  Hypertension Patient is compliant with all medicines but he ran out of his triamterene today and will not receive his mail order for several days.  Asking for temporary supply to be sent to local pharmacy.  No chest pain reported.  He has some edema as above.   ROS: Per HPI  Allergies  Allergen Reactions   Paxil [Paroxetine Hcl] Palpitations   Codeine Other (See Comments)   Eliquis [Apixaban] Other (See Comments)    dizziness   Penicillins Other (See Comments)    Did it involve swelling of the face/tongue/throat, SOB, or low BP? Unknown Did it involve sudden or severe rash/hives, skin peeling, or any reaction on the inside of your mouth or nose? Unknown Did you need to seek medical attention at a hospital or doctor's office? Unknown When did it last happen?      unk If all above answers are "NO", may proceed with cephalosporin use.    Pravachol [Pravastatin Sodium] Other (See Comments)    myalgias   Zetia [Ezetimibe] Other (See Comments)    myalgias   Vytorin [Ezetimibe-Simvastatin] Other (See Comments)    myalgias   Past Medical History:  Diagnosis Date   Anal fissure    Arthritis    Atrial fibrillation (HCC)    BPH (benign prostatic hypertrophy)    CAD (coronary artery disease)    Dr. Donnie Aho   Cataract    COVID-19    Depression 2004   Diverticulosis    GERD (gastroesophageal reflux  disease)    History of TIAs    Hyperlipidemia    Hypertension    Hypertensive heart disease without CHF    Hypothyroidism    Internal hemorrhoids    Lumbar disc disease    Oral bleeding 08/20/2012   Following molar tooth extraction July, 2013   Pneumonia    Ruptured disk 1995   Shingles    Thyroid disease    Vitamin D deficiency    Vitreous detachment     Current Outpatient Medications:    albuterol (VENTOLIN HFA) 108 (90 Base) MCG/ACT inhaler, Inhale 2 puffs into the lungs every 4 (four) hours as needed., Disp: , Rfl:    amLODipine (NORVASC) 5 MG tablet, TAKE 1 TABLET DAILY, Disp: 90 tablet, Rfl: 0   azelastine (ASTELIN) 0.1 % nasal spray, Place 1 spray into both nostrils 2 (two) times daily., Disp: 90 mL, Rfl: 4   benzonatate (TESSALON PERLES) 100 MG capsule, Take 1 capsule (100 mg total) by mouth 3 (three) times daily as needed for cough., Disp: 20 capsule, Rfl: 0   Calcium Carb-Cholecalciferol (CALCIUM + D3) 600-200 MG-UNIT TABS, Take 1 tablet by mouth daily. , Disp: , Rfl:    cetirizine (ZYRTEC ALLERGY) 10 MG tablet, Take 0.5 tablets (5 mg total) by mouth at bedtime  as needed for allergies (drainage)., Disp: 90 tablet, Rfl: 3   Cholecalciferol (VITAMIN D-3) 5000 UNITS TABS, Take 1 tablet by mouth daily., Disp: , Rfl:    doxazosin (CARDURA) 8 MG tablet, Take 1 tablet (8 mg total) by mouth at bedtime., Disp: 90 tablet, Rfl: 3   levothyroxine (SYNTHROID) 50 MCG tablet, TAKE 1 TABLET DAILY BEFORE BREAKFAST, Disp: 90 tablet, Rfl: 2   lidocaine (XYLOCAINE) 5 % ointment, Apply 1 application topically as needed., Disp: 35.44 g, Rfl: 0   linaclotide (LINZESS) 145 MCG CAPS capsule, Take 1 capsule (145 mcg total) by mouth daily before breakfast., Disp: 8 capsule, Rfl: 0   mirabegron ER (MYRBETRIQ) 25 MG TB24 tablet, Take 1 tablet (25 mg total) by mouth daily., Disp: 45 tablet, Rfl: 0   mirtazapine (REMERON) 15 MG tablet, TAKE 1 TABLET AT BEDTIME, Disp: 90 tablet, Rfl: 0   Multiple Vitamin  (MULTIVITAMIN) tablet, Take 1 tablet by mouth daily., Disp: , Rfl:    ondansetron (ZOFRAN-ODT) 4 MG disintegrating tablet, Take 1 tablet (4 mg total) by mouth every 8 (eight) hours as needed for nausea or vomiting., Disp: 20 tablet, Rfl: 0   ramipril (ALTACE) 10 MG capsule, TAKE 1 CAPSULE DAILY, Disp: 90 capsule, Rfl: 3   rosuvastatin (CRESTOR) 5 MG tablet, Take 1 tablet by mouth Mondays, Wednesdays and Fridays, Disp: 36 tablet, Rfl: 3   SIMETHICONE PO, Take by mouth as needed., Disp: , Rfl:    triamterene-hydrochlorothiazide (MAXZIDE-25) 37.5-25 MG tablet, Take 1 tablet by mouth daily., Disp: 90 tablet, Rfl: 3   vitamin C (ASCORBIC ACID) 500 MG tablet, Take 500 mg by mouth daily. , Disp: , Rfl:    warfarin (COUMADIN) 5 MG tablet, TAKE 1 TABLET DAILY AS DIRECTED, Disp: 90 tablet, Rfl: 3   XIIDRA 5 % SOLN, Apply 1 drop to eye as needed. , Disp: , Rfl:  Social History   Socioeconomic History   Marital status: Widowed    Spouse name: Harriett Sine    Number of children: 1   Years of education: 12   Highest education level: Not on file  Occupational History   Occupation: Retired    Veterinary surgeon: Company secretary x 27 years   Occupation: Retired    Associate Professor: Public librarian    Comment: supervisor x 17 years  Tobacco Use   Smoking status: Former    Packs/day: 1.00    Years: 18.00    Additional pack years: 0.00    Total pack years: 18.00    Types: Cigarettes    Start date: 12/17/1948    Quit date: 12/17/1966    Years since quitting: 56.3   Smokeless tobacco: Never  Vaping Use   Vaping Use: Never used  Substance and Sexual Activity   Alcohol use: Yes    Alcohol/week: 1.0 standard drink of alcohol    Types: 1 Standard drinks or equivalent per week    Comment: rare   Drug use: No   Sexual activity: Not on file  Other Topics Concern   Not on file  Social History Narrative   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[employer]      Right-handed      Caffeine use: none      Son and  grandson live with him   Social Determinants of Health   Financial Resource Strain: Low Risk  (03/05/2023)   Overall Financial Resource Strain (CARDIA)    Difficulty of Paying Living Expenses: Not hard at all  Food Insecurity:  No Food Insecurity (03/05/2023)   Hunger Vital Sign    Worried About Running Out of Food in the Last Year: Never true    Ran Out of Food in the Last Year: Never true  Transportation Needs: No Transportation Needs (03/05/2023)   PRAPARE - Administrator, Civil Service (Medical): No    Lack of Transportation (Non-Medical): No  Physical Activity: Sufficiently Active (03/05/2023)   Exercise Vital Sign    Days of Exercise per Week: 5 days    Minutes of Exercise per Session: 60 min  Stress: No Stress Concern Present (03/05/2023)   [employer] of Occupational Health - Occupational Stress Questionnaire    Feeling of Stress : Not at all  Social Connections: Socially Isolated (03/05/2023)   Social Connection and Isolation Panel [NHANES]    Frequency of Communication with Friends and Family: More than three times a week    Frequency of Social Gatherings with Friends and Family: More than three times a week    Attends Religious Services: Never    Database administrator or Organizations: No    Attends Banker Meetings: Never    Marital Status: Widowed  Intimate Partner Violence: Not At Risk (03/05/2023)   Humiliation, Afraid, Rape, and Kick questionnaire    Fear of Current or Ex-Partner: No    Emotionally Abused: No    Physically Abused: No    Sexually Abused: No   Family History  Problem Relation Age of Onset   Dementia Mother    Arthritis Mother    Cancer Brother        prostate   Asthma Sister    Heart disease Paternal Aunt    Heart disease Paternal Uncle    Hypertension Maternal Grandmother    CVA Maternal Grandmother    CVA Paternal Grandmother    Hepatitis C Son    Arthritis Son    Colon cancer Neg Hx    Colon polyps Neg Hx     Kidney disease Neg Hx    Esophageal cancer Neg Hx    Gallbladder disease Neg Hx    Diabetes Neg Hx     Objective: Office vital signs reviewed. BP (!) 151/82   Pulse 73   Temp 98.8 F (37.1 C)   Ht 6\' 1"  (1.854 m)   Wt 170 lb (77.1 kg)   SpO2 94%   BMI 22.43 kg/m   Physical Examination:  General: Awake, alert, nontoxic-appearing elderly male, No acute distress HEENT: Sclera white.  Moist mucous membranes MSK: Ambulating independently  Left lower extremity: Egg sized hematoma noted along the left anterior shin over the tibia.  There are no palpable deficits but he is exquisitely tender to palpation in this area and has associated soft tissue swelling with gravity dependent discoloration of the left lower leg.  DG Tibia/Fibula Right  Result Date: 04/16/2023 CLINICAL DATA:  trauma to RLE last week, on blood thinner EXAM: RIGHT TIBIA AND FIBULA - 2 VIEW COMPARISON:  07/07/2020 FINDINGS: There is no evidence of fracture or other focal bone lesions. Soft tissues are unremarkable. IMPRESSION: Negative. Electronically Signed   By: Charlett Nose M.D.   On: 04/16/2023 11:00     Assessment/ Plan: 89 y.o. male   Hematoma of left lower leg - Plan: DG Tibia/Fibula Right  Injury of left lower extremity, initial encounter - Plan: DG Tibia/Fibula Right  Primary hypertension - Plan: triamterene-hydrochlorothiazide (MAXZIDE-25) 37.5-25 MG tablet  Definitely has a hematoma of the left lower leg.  I obtained plain films given exquisite tenderness palpation and advanced age to rule out any type of fracture.  No fracture appreciated.  He can continue supportive care for hematoma with ice, elevation and Tylenol.  No NSAIDs unfortunately secondary to use of chronic anticoagulation  Blood pressure not at goal.  This may be a pain response.  He will follow-up in 2 weeks for INR check and if blood pressure remains uncontrolled, we may need to revisit his current regimen  No orders of the defined types  were placed in this encounter.  No orders of the defined types were placed in this encounter.    Raliegh Ip, DO [facility] [phone]

## 2023-04-16 NOTE — Patient Instructions (Signed)
Hematoma A hematoma is a collection of blood. A hematoma can happen: Under the skin. In an organ. In a body space. In a joint space. In other tissues. The blood can thicken (clot) to form a lump that you can see and feel. The lump is often hard and may become sore and tender. The lump can be very small or very big. Most hematomas get better in a few days to weeks. However, some of these may be serious and need medical care. What are the causes? This condition is caused by: An injury. Blood that leaks under the skin. Problems from surgeries. Medical conditions that cause bleeding or bruising. What increases the risk? You are more likely to develop this condition if: You are an older adult. You use medicines that thin your blood. You use NSAIDs, such as ibuprofen, often for pain. You play contact sports. What are the signs or symptoms? Symptoms depend on where the hematoma is in your body. If the hematoma is under the skin, there is: A firm lump on the body. Pain and tenderness in the area. Bruising. The skin above the lump may be blue, dark blue, purple-red, or yellowish. If the hematoma is deep in the tissues or body spaces, there may be: Blood in the stomach. This may cause pain in the belly (abdomen), weakness, passing out (fainting), and shortness of breath. Blood in the head. This may cause a headache, weakness, trouble speaking or understanding speech, or passing out. How is this treated? Treatment depends on the cause, size, and location of the hematoma. Treatment may include: Doing nothing. Many hematomas go away on their own without treatment. Surgery or close monitoring. This may be needed for large hematomas or hematomas that affect the body's organs. Medicines. These may be given if a medical condition caused the hematoma. Follow these instructions at home: Managing pain, stiffness, and swelling  If told, put ice on the injured area. To do this: Put ice in a plastic  bag. Place a towel between your skin and the bag. Leave the ice on for 20 minutes, 2-3 times a day for the first two days. If your skin turns bright red, take off the ice right away to prevent skin damage. The risk of skin damage is higher if you cannot feel pain, heat, or cold. If told, put heat on the injured area. Do this as often as told by your doctor. Use the heat source that your doctor recommends, such as a moist heat pack or a heating pad. Place a towel between your skin and the heat source. Leave the heat on for 20-30 minutes. If your skin turns bright red, take off the heat right away to prevent burns. The risk of burns is higher if you cannot feel pain, heat, or cold. Raise the injured area above the level of your heart while you are sitting or lying down. Wrap the affected area with an elastic bandage, if told by your doctor. Do not wrap the bandage too tight. If your hematoma is on a leg or foot and is painful, your doctor may give you crutches. Use them as told by your doctor. General instructions Take over-the-counter and prescription medicines only as told by your doctor. Keep all follow-up visits. Your doctor may want to see how your hematoma is healing with treatment. Contact a doctor if: You have a fever. The swelling or bruising gets worse. You start to get more hematomas. Your pain gets worse. Your pain is not getting better   with medicine. The skin over the hematoma breaks or starts to bleed. Get help right away if: Your hematoma is in your chest or belly and you: Pass out. Feel weak. Become short of breath. You have a hematoma on your scalp that is caused by a fall or injury, and you: Have a headache that gets worse. Have trouble speaking or understanding speech. Become less alert or you pass out. These symptoms may be an emergency. Get help right away. Call 911. Do not wait to see if the symptoms will go away. Do not drive yourself to the hospital This  information is not intended to replace advice given to you by your health care provider. Make sure you discuss any questions you have with your health care provider. Document Revised: 05/28/2022 Document Reviewed: 05/28/2022 Elsevier Patient Education  2023 Elsevier Inc.  

## 2023-04-17 ENCOUNTER — Ambulatory Visit: Payer: Medicare Other | Admitting: Family Medicine

## 2023-04-26 ENCOUNTER — Other Ambulatory Visit: Payer: Self-pay | Admitting: Family Medicine

## 2023-04-26 DIAGNOSIS — I10 Essential (primary) hypertension: Secondary | ICD-10-CM

## 2023-04-29 ENCOUNTER — Other Ambulatory Visit: Payer: Self-pay | Admitting: Family Medicine

## 2023-04-29 DIAGNOSIS — I700 Atherosclerosis of aorta: Secondary | ICD-10-CM

## 2023-05-03 ENCOUNTER — Ambulatory Visit (INDEPENDENT_AMBULATORY_CARE_PROVIDER_SITE_OTHER): Payer: Medicare Other | Admitting: Family Medicine

## 2023-05-03 ENCOUNTER — Encounter: Payer: Self-pay | Admitting: Family Medicine

## 2023-05-03 VITALS — BP 129/75 | HR 83 | Temp 97.9°F | Ht 73.0 in | Wt 169.0 lb

## 2023-05-03 DIAGNOSIS — I482 Chronic atrial fibrillation, unspecified: Secondary | ICD-10-CM | POA: Diagnosis not present

## 2023-05-03 DIAGNOSIS — S8012XA Contusion of left lower leg, initial encounter: Secondary | ICD-10-CM

## 2023-05-03 DIAGNOSIS — Z7901 Long term (current) use of anticoagulants: Secondary | ICD-10-CM | POA: Diagnosis not present

## 2023-05-03 DIAGNOSIS — R791 Abnormal coagulation profile: Secondary | ICD-10-CM

## 2023-05-03 DIAGNOSIS — S8012XD Contusion of left lower leg, subsequent encounter: Secondary | ICD-10-CM

## 2023-05-03 LAB — COAGUCHEK XS/INR WAIVED
INR: 2.3 — ABNORMAL HIGH (ref 0.9–1.1)
Prothrombin Time: 27 s

## 2023-05-03 NOTE — Progress Notes (Signed)
Subjective: CC: INR check PCP: Raliegh Ip, DO HPI:Dalton Vaughan is a 89 y.o. male presenting to clinic today for:  1.  Chronic atrial fibrillation Goal INR 2-3 Patient compliant with Coumadin.  No bleeding.  Hematoma on left lower extremity is improving and no longer tender.   ROS: Per HPI  Allergies  Allergen Reactions   Paxil [Paroxetine Hcl] Palpitations   Codeine Other (See Comments)   Eliquis [Apixaban] Other (See Comments)    dizziness   Penicillins Other (See Comments)    Did it involve swelling of the face/tongue/throat, SOB, or low BP? Unknown Did it involve sudden or severe rash/hives, skin peeling, or any reaction on the inside of your mouth or nose? Unknown Did you need to seek medical attention at a hospital or doctor's office? Unknown When did it last happen?      unk If all above answers are "NO", may proceed with cephalosporin use.    Pravachol [Pravastatin Sodium] Other (See Comments)    myalgias   Zetia [Ezetimibe] Other (See Comments)    myalgias   Vytorin [Ezetimibe-Simvastatin] Other (See Comments)    myalgias   Past Medical History:  Diagnosis Date   Anal fissure    Arthritis    Atrial fibrillation (HCC)    BPH (benign prostatic hypertrophy)    CAD (coronary artery disease)    Dr. Donnie Aho   Cataract    COVID-19    Depression 2004   Diverticulosis    GERD (gastroesophageal reflux disease)    History of TIAs    Hyperlipidemia    Hypertension    Hypertensive heart disease without CHF    Hypothyroidism    Internal hemorrhoids    Lumbar disc disease    Oral bleeding 08/20/2012   Following molar tooth extraction July, 2013   Pneumonia    Ruptured disk 1995   Shingles    Thyroid disease    Vitamin D deficiency    Vitreous detachment     Current Outpatient Medications:    albuterol (VENTOLIN HFA) 108 (90 Base) MCG/ACT inhaler, Inhale 2 puffs into the lungs every 4 (four) hours as needed., Disp: , Rfl:    amLODipine (NORVASC) 5 MG  tablet, TAKE 1 TABLET DAILY, Disp: 90 tablet, Rfl: 0   azelastine (ASTELIN) 0.1 % nasal spray, Place 1 spray into both nostrils 2 (two) times daily., Disp: 90 mL, Rfl: 4   benzonatate (TESSALON PERLES) 100 MG capsule, Take 1 capsule (100 mg total) by mouth 3 (three) times daily as needed for cough., Disp: 20 capsule, Rfl: 0   Calcium Carb-Cholecalciferol (CALCIUM + D3) 600-200 MG-UNIT TABS, Take 1 tablet by mouth daily. , Disp: , Rfl:    cetirizine (ZYRTEC ALLERGY) 10 MG tablet, Take 0.5 tablets (5 mg total) by mouth at bedtime as needed for allergies (drainage)., Disp: 90 tablet, Rfl: 3   Cholecalciferol (VITAMIN D-3) 5000 UNITS TABS, Take 1 tablet by mouth daily., Disp: , Rfl:    doxazosin (CARDURA) 8 MG tablet, Take 1 tablet (8 mg total) by mouth at bedtime., Disp: 90 tablet, Rfl: 3   levothyroxine (SYNTHROID) 50 MCG tablet, TAKE 1 TABLET DAILY BEFORE BREAKFAST, Disp: 90 tablet, Rfl: 2   lidocaine (XYLOCAINE) 5 % ointment, Apply 1 application topically as needed., Disp: 35.44 g, Rfl: 0   linaclotide (LINZESS) 145 MCG CAPS capsule, Take 1 capsule (145 mcg total) by mouth daily before breakfast., Disp: 8 capsule, Rfl: 0   mirabegron ER (MYRBETRIQ) 25 MG TB24 tablet, Take  1 tablet (25 mg total) by mouth daily., Disp: 45 tablet, Rfl: 0   mirtazapine (REMERON) 15 MG tablet, TAKE 1 TABLET AT BEDTIME, Disp: 90 tablet, Rfl: 0   Multiple Vitamin (MULTIVITAMIN) tablet, Take 1 tablet by mouth daily., Disp: , Rfl:    ondansetron (ZOFRAN-ODT) 4 MG disintegrating tablet, Take 1 tablet (4 mg total) by mouth every 8 (eight) hours as needed for nausea or vomiting., Disp: 20 tablet, Rfl: 0   ramipril (ALTACE) 10 MG capsule, TAKE 1 CAPSULE DAILY, Disp: 90 capsule, Rfl: 0   rosuvastatin (CRESTOR) 5 MG tablet, Take 1 tablet by mouth Mondays, Wednesdays and Fridays, Disp: 36 tablet, Rfl: 3   SIMETHICONE PO, Take by mouth as needed., Disp: , Rfl:    triamterene-hydrochlorothiazide (MAXZIDE-25) 37.5-25 MG tablet, Take 1  tablet by mouth daily., Disp: 90 tablet, Rfl: 3   triamterene-hydrochlorothiazide (MAXZIDE-25) 37.5-25 MG tablet, Take 1 tablet by mouth daily. [facility]. Waiting on mail order, Disp: 30 tablet, Rfl: 0   vitamin C (ASCORBIC ACID) 500 MG tablet, Take 500 mg by mouth daily. , Disp: , Rfl:    warfarin (COUMADIN) 5 MG tablet, TAKE 1 TABLET DAILY AS DIRECTED, Disp: 90 tablet, Rfl: 3   XIIDRA 5 % SOLN, Apply 1 drop to eye as needed. , Disp: , Rfl:  Social History   Socioeconomic History   Marital status: Widowed    Spouse name: Harriett Sine    Number of children: 1   Years of education: 12   Highest education level: Not on file  Occupational History   Occupation: Retired    Veterinary surgeon: Company secretary x 27 years   Occupation: Retired    Associate Professor: Public librarian    Comment: supervisor x 17 years  Tobacco Use   Smoking status: Former    Packs/day: 1.00    Years: 18.00    Additional pack years: 0.00    Total pack years: 18.00    Types: Cigarettes    Start date: 12/17/1948    Quit date: 12/17/1966    Years since quitting: 56.4   Smokeless tobacco: Never  Vaping Use   Vaping Use: Never used  Substance and Sexual Activity   Alcohol use: Yes    Alcohol/week: 1.0 standard drink of alcohol    Types: 1 Standard drinks or equivalent per week    Comment: rare   Drug use: No   Sexual activity: Not on file  Other Topics Concern   Not on file  Social History Narrative   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[employer]      Right-handed      Caffeine use: none      Son and grandson live with him   Social Determinants of Health   Financial Resource Strain: Low Risk  (03/05/2023)   Overall Financial Resource Strain (CARDIA)    Difficulty of Paying Living Expenses: Not hard at all  Food Insecurity: No Food Insecurity (03/05/2023)   Hunger Vital Sign    Worried About Running Out of Food in the Last Year: Never true    Ran Out of Food in the Last Year: Never true  Transportation  Needs: No Transportation Needs (03/05/2023)   PRAPARE - Administrator, Civil Service (Medical): No    Lack of Transportation (Non-Medical): No  Physical Activity: Sufficiently Active (03/05/2023)   Exercise Vital Sign    Days of Exercise per Week: 5 days    Minutes of Exercise per Session: 60 min  Stress: No Stress Concern Present (03/05/2023)   [employer] of Occupational Health - Occupational Stress Questionnaire    Feeling of Stress : Not at all  Social Connections: Socially Isolated (03/05/2023)   Social Connection and Isolation Panel [NHANES]    Frequency of Communication with Friends and Family: More than three times a week    Frequency of Social Gatherings with Friends and Family: More than three times a week    Attends Religious Services: Never    Database administrator or Organizations: No    Attends Banker Meetings: Never    Marital Status: Widowed  Intimate Partner Violence: Not At Risk (03/05/2023)   Humiliation, Afraid, Rape, and Kick questionnaire    Fear of Current or Ex-Partner: No    Emotionally Abused: No    Physically Abused: No    Sexually Abused: No   Family History  Problem Relation Age of Onset   Dementia Mother    Arthritis Mother    Cancer Brother        prostate   Asthma Sister    Heart disease Paternal Aunt    Heart disease Paternal Uncle    Hypertension Maternal Grandmother    CVA Maternal Grandmother    CVA Paternal Grandmother    Hepatitis C Son    Arthritis Son    Colon cancer Neg Hx    Colon polyps Neg Hx    Kidney disease Neg Hx    Esophageal cancer Neg Hx    Gallbladder disease Neg Hx    Diabetes Neg Hx     Objective: Office vital signs reviewed. BP 129/75   Pulse 83   Temp 97.9 F (36.6 C)   Ht 6\' 1"  (1.854 m)   Wt 169 lb (76.7 kg)   SpO2 96%   BMI 22.30 kg/m   Physical Examination:  General: Awake, alert, well nourished, No acute distress HEENT: sclera white, MMM Cardio: regular rate and  rhythm, S1S2 heard, no murmurs appreciated Pulm: clear to auscultation bilaterally, no wheezes, rhonchi or rales; normal work of breathing on room air Extremities: Warm, well-perfused.  No edema.  Small residual hematoma noted along the left medial lower leg that is improved compared to previous  Assessment/ Plan: 89 y.o. male   Chronic atrial fibrillation (HCC) - Plan: CoaguChek XS/INR Waived  Chronic anticoagulation - Plan: CoaguChek XS/INR Waived  Hematoma of left lower leg  INR therapeutic.  Hematoma resolving.  May follow-up in 8 weeks, sooner if concerns arise  No orders of the defined types were placed in this encounter.  No orders of the defined types were placed in this encounter.    Raliegh Ip, DO [facility] [phone]

## 2023-05-06 DIAGNOSIS — Z1330 Encounter for screening examination for mental health and behavioral disorders, unspecified: Secondary | ICD-10-CM | POA: Diagnosis not present

## 2023-05-06 DIAGNOSIS — Z950 Presence of cardiac pacemaker: Secondary | ICD-10-CM | POA: Diagnosis not present

## 2023-05-06 DIAGNOSIS — I2510 Atherosclerotic heart disease of native coronary artery without angina pectoris: Secondary | ICD-10-CM | POA: Diagnosis not present

## 2023-05-06 DIAGNOSIS — Z951 Presence of aortocoronary bypass graft: Secondary | ICD-10-CM | POA: Diagnosis not present

## 2023-05-06 DIAGNOSIS — I4891 Unspecified atrial fibrillation: Secondary | ICD-10-CM | POA: Diagnosis not present

## 2023-05-06 DIAGNOSIS — I495 Sick sinus syndrome: Secondary | ICD-10-CM | POA: Diagnosis not present

## 2023-05-16 DIAGNOSIS — I70203 Unspecified atherosclerosis of native arteries of extremities, bilateral legs: Secondary | ICD-10-CM | POA: Diagnosis not present

## 2023-05-16 DIAGNOSIS — L603 Nail dystrophy: Secondary | ICD-10-CM | POA: Diagnosis not present

## 2023-05-16 DIAGNOSIS — L84 Corns and callosities: Secondary | ICD-10-CM | POA: Diagnosis not present

## 2023-05-16 DIAGNOSIS — M79676 Pain in unspecified toe(s): Secondary | ICD-10-CM | POA: Diagnosis not present

## 2023-05-31 DIAGNOSIS — Z87891 Personal history of nicotine dependence: Secondary | ICD-10-CM | POA: Diagnosis not present

## 2023-05-31 DIAGNOSIS — E039 Hypothyroidism, unspecified: Secondary | ICD-10-CM | POA: Diagnosis not present

## 2023-05-31 DIAGNOSIS — R531 Weakness: Secondary | ICD-10-CM | POA: Diagnosis not present

## 2023-05-31 DIAGNOSIS — Z885 Allergy status to narcotic agent status: Secondary | ICD-10-CM | POA: Diagnosis not present

## 2023-05-31 DIAGNOSIS — E785 Hyperlipidemia, unspecified: Secondary | ICD-10-CM | POA: Diagnosis not present

## 2023-05-31 DIAGNOSIS — T50905A Adverse effect of unspecified drugs, medicaments and biological substances, initial encounter: Secondary | ICD-10-CM | POA: Diagnosis not present

## 2023-05-31 DIAGNOSIS — I2510 Atherosclerotic heart disease of native coronary artery without angina pectoris: Secondary | ICD-10-CM | POA: Diagnosis not present

## 2023-05-31 DIAGNOSIS — R9431 Abnormal electrocardiogram [ECG] [EKG]: Secondary | ICD-10-CM | POA: Diagnosis not present

## 2023-05-31 DIAGNOSIS — K219 Gastro-esophageal reflux disease without esophagitis: Secondary | ICD-10-CM | POA: Diagnosis not present

## 2023-05-31 DIAGNOSIS — Z79890 Hormone replacement therapy (postmenopausal): Secondary | ICD-10-CM | POA: Diagnosis not present

## 2023-05-31 DIAGNOSIS — F419 Anxiety disorder, unspecified: Secondary | ICD-10-CM | POA: Diagnosis not present

## 2023-05-31 DIAGNOSIS — I4891 Unspecified atrial fibrillation: Secondary | ICD-10-CM | POA: Diagnosis not present

## 2023-05-31 DIAGNOSIS — Z79899 Other long term (current) drug therapy: Secondary | ICD-10-CM | POA: Diagnosis not present

## 2023-05-31 DIAGNOSIS — I10 Essential (primary) hypertension: Secondary | ICD-10-CM | POA: Diagnosis not present

## 2023-05-31 DIAGNOSIS — Z880 Allergy status to penicillin: Secondary | ICD-10-CM | POA: Diagnosis not present

## 2023-05-31 DIAGNOSIS — Z888 Allergy status to other drugs, medicaments and biological substances status: Secondary | ICD-10-CM | POA: Diagnosis not present

## 2023-06-04 ENCOUNTER — Ambulatory Visit (INDEPENDENT_AMBULATORY_CARE_PROVIDER_SITE_OTHER): Payer: Medicare Other | Admitting: Nurse Practitioner

## 2023-06-04 ENCOUNTER — Encounter: Payer: Self-pay | Admitting: Nurse Practitioner

## 2023-06-04 VITALS — BP 127/70 | HR 82 | Temp 98.4°F | Resp 20 | Ht 73.0 in | Wt 166.0 lb

## 2023-06-04 DIAGNOSIS — R6889 Other general symptoms and signs: Secondary | ICD-10-CM | POA: Diagnosis not present

## 2023-06-04 DIAGNOSIS — Z09 Encounter for follow-up examination after completed treatment for conditions other than malignant neoplasm: Secondary | ICD-10-CM

## 2023-06-04 DIAGNOSIS — R42 Dizziness and giddiness: Secondary | ICD-10-CM | POA: Diagnosis not present

## 2023-06-04 DIAGNOSIS — D696 Thrombocytopenia, unspecified: Secondary | ICD-10-CM

## 2023-06-04 NOTE — Progress Notes (Signed)
Subjective:    Patient ID: Dalton Vaughan, male    DOB: [birth year], 89 y.o.   MRN: [id number]   Chief Complaint: Hospitalization Follow-up   HPI Patient was seen in ED on 06/04/23. Patient had been feeling dizzy at home and took some meclizine. Shortly there after he developed akathisia and weakness. Labs were normal for the most part. He was discharged home. Was told that was having reaction from taking meclizine with his other night time meds.   Patient Active Problem List   Diagnosis Date Noted   Purpura senilis (HCC) 06/27/2022   Left lower quadrant abdominal pain 11/08/2021   Diverticulitis 08/08/2021   Pacemaker 11/03/2020   Radiculopathy, lumbar region 01/25/2020   Hypertension 01/25/2020   Acute respiratory failure with hypoxia (HCC) 09/16/2019   Osteoarthritis of spine with radiculopathy, lumbar region 01/08/2018   Thyroid disease    Aortic atherosclerosis (HCC) 05/02/2017   Thrombocytopenia (HCC) 07/10/2016   Cervical spondylosis without myelopathy 04/25/2016   Hereditary and idiopathic peripheral neuropathy 10/16/2015   Polyneuropathy 10/04/2015   Allergic rhinitis 05/06/2014   Depression 08/03/2013   Sick sinus syndrome (HCC) 07/31/2012   Hypertensive heart disease without CHF    Chronic atrial fibrillation (HCC)    Coronary artery disease involving native coronary artery of native heart with angina pectoris (HCC)    History of TIAs    GERD (gastroesophageal reflux disease)    Chronic anticoagulation    Benign prostatic hyperplasia    Hyperlipidemia        Review of Systems  Constitutional:  Negative for diaphoresis.  Eyes:  Negative for pain.  Respiratory:  Negative for shortness of breath.   Cardiovascular:  Negative for chest pain, palpitations and leg swelling.  Gastrointestinal:  Negative for abdominal pain.  Endocrine: Negative for polydipsia.  Skin:  Negative for rash.  Neurological:  Positive for dizziness. Negative for weakness and headaches.   Hematological:  Does not bruise/bleed easily.  All other systems reviewed and are negative.      Objective:   Physical Exam Vitals reviewed.  Constitutional:      Appearance: Normal appearance.  Cardiovascular:     Rate and Rhythm: Normal rate and regular rhythm.     Heart sounds: Normal heart sounds.  Pulmonary:     Effort: Pulmonary effort is normal.     Breath sounds: Normal breath sounds.  Skin:    General: Skin is warm.  Neurological:     General: No focal deficit present.     Mental Status: He is alert and oriented to person, place, and time.     Cranial Nerves: No cranial nerve deficit.     Sensory: No sensory deficit.  Psychiatric:        Mood and Affect: Mood normal.        Behavior: Behavior normal.    BP 127/70   Pulse 82   Temp 98.4 F (36.9 C) (Temporal)   Resp 20   Ht 6\' 1"  (1.854 m)   Wt 166 lb (75.3 kg)   SpO2 98%   BMI 21.90 kg/m         Assessment & Plan:   Dalton Vaughan in today with chief complaint of Hospitalization Follow-up   1. Vertigo Labs pending Change positions slowly - Anemia Profile B - CBC with Differential/Platelet  2. Hospital discharge follow-up Hospital follow up    The above assessment and management plan was discussed with the patient. The patient verbalized understanding of and has agreed to the  management plan. Patient is aware to call the clinic if symptoms persist or worsen. Patient is aware when to return to the clinic for a follow-up visit. Patient educated on when it is appropriate to go to the emergency department.   Mary-Margaret Hassell Done, FNP

## 2023-06-04 NOTE — Patient Instructions (Signed)
Vertigo Vertigo is the feeling that you or the things around you are moving when they are not. This feeling can come and go at any time. Vertigo often goes away on its own. This condition can be dangerous if it happens when you are doing activities like driving or working with machines. Your doctor will do tests to find the cause of your vertigo. These tests will also help your doctor decide on the best treatment for you. Follow these instructions at home: Eating and drinking     Drink enough fluid to keep your pee (urine) pale yellow. Do not drink alcohol. Activity Return to your normal activities when your doctor says that it is safe. In the morning, first sit up on the side of the bed. When you feel okay, stand slowly while you hold onto something until you know that your balance is fine. Move slowly. Avoid sudden body or head movements or certain positions, as told by your doctor. Use a cane if you have trouble standing or walking. Sit down right away if you feel dizzy. Avoid doing any tasks or activities that can cause danger to you or others if you get dizzy. Avoid bending down if you feel dizzy. Place items in your home so that they are easy for you to reach without bending or leaning over. Do not drive or use machinery if you feel dizzy. General instructions Take over-the-counter and prescription medicines only as told by your doctor. Keep all follow-up visits. Contact a doctor if: Your medicine does not help your vertigo. Your problems get worse or you have new symptoms. You have a fever. You feel like you may vomit (nauseous), or this feeling gets worse. You start to vomit. Your family or friends see changes in how you act. You lose feeling (have numbness) in part of your body. You feel prickling and tingling in a part of your body. Get help right away if: You are always dizzy. You faint. You get very bad headaches. You get a stiff neck. Bright light starts to bother  you. You have trouble moving or talking. You feel weak in your hands, arms, or legs. You have changes in your hearing or in how you see (vision). These symptoms may be an emergency. Get help right away. Call your local emergency services (911 in the [place] Do not wait to see if the symptoms will go away. Do not drive yourself to the hospital. Summary Vertigo is the feeling that you or the things around you are moving when they are not. Your doctor will do tests to find the cause of your vertigo. You may be told to avoid some tasks, positions, or movements. Contact a doctor if your medicine is not helping, or if you have a fever, new symptoms, or a change in how you act. Get help right away if you get very bad headaches, or if you have changes in how you speak, hear, or see. This information is not intended to replace advice given to you by your health care provider. Make sure you discuss any questions you have with your health care provider. Document Revised: 11/02/2020 Document Reviewed: 11/02/2020 Elsevier Patient Education  2024 Elsevier Inc.  

## 2023-06-05 LAB — ANEMIA PROFILE B
Basophils Absolute: 0 10*3/uL (ref 0.0–0.2)
Basos: 0 %
EOS (ABSOLUTE): 0.1 10*3/uL (ref 0.0–0.4)
Eos: 2 %
Ferritin: 136 ng/mL (ref 30–400)
Folate: >20 ng/mL (ref >3.0)
Hematocrit: 40.9 % (ref 37.5–51.0)
Hemoglobin: 13.4 g/dL (ref 13.0–17.7)
Immature Grans (Abs): 0 10*3/uL (ref 0.0–0.1)
Immature Granulocytes: 0 %
Iron Saturation: 30 % (ref 15–55)
Iron: 92 ug/dL (ref 38–169)
Lymphocytes Absolute: 1.7 10*3/uL (ref 0.7–3.1)
Lymphs: 31 %
MCH: 30.1 pg (ref 26.6–33.0)
MCHC: 32.8 g/dL (ref 31.5–35.7)
MCV: 92 fL (ref 79–97)
Monocytes Absolute: 0.4 10*3/uL (ref 0.1–0.9)
Monocytes: 8 %
Neutrophils Absolute: 3.2 10*3/uL (ref 1.4–7.0)
Neutrophils: 59 %
Platelets: 125 10*3/uL — ABNORMAL LOW (ref 150–450)
RBC: 4.45 x10E6/uL (ref 4.14–5.80)
RDW: 13.9 % (ref 11.6–15.4)
Retic Ct Pct: 0.6 % (ref 0.6–2.6)
Total Iron Binding Capacity: 302 ug/dL (ref 250–450)
UIBC: 210 ug/dL (ref 111–343)
Vitamin B-12: 667 pg/mL (ref 232–1245)
WBC: 5.4 10*3/uL (ref 3.4–10.8)

## 2023-06-06 ENCOUNTER — Other Ambulatory Visit: Payer: Self-pay

## 2023-06-06 NOTE — Addendum Note (Signed)
Addended by: Bennie Pierini on: 06/06/2023 12:08 PM   Modules accepted: Orders

## 2023-06-24 ENCOUNTER — Inpatient Hospital Stay: Payer: Medicare Other

## 2023-06-24 ENCOUNTER — Inpatient Hospital Stay: Payer: Medicare Other | Attending: Hematology | Admitting: Hematology

## 2023-06-24 ENCOUNTER — Encounter: Payer: Self-pay | Admitting: Hematology

## 2023-06-24 VITALS — BP 125/71 | HR 79 | Temp 97.6°F | Resp 16 | Ht 73.0 in | Wt 167.0 lb

## 2023-06-24 DIAGNOSIS — D696 Thrombocytopenia, unspecified: Secondary | ICD-10-CM

## 2023-06-24 DIAGNOSIS — Z7901 Long term (current) use of anticoagulants: Secondary | ICD-10-CM | POA: Diagnosis not present

## 2023-06-24 DIAGNOSIS — I482 Chronic atrial fibrillation, unspecified: Secondary | ICD-10-CM | POA: Diagnosis not present

## 2023-06-24 LAB — CBC WITH DIFFERENTIAL/PLATELET
Abs Immature Granulocytes: 0.01 10*3/uL (ref 0.00–0.07)
Basophils Absolute: 0 10*3/uL (ref 0.0–0.1)
Basophils Relative: 0 %
Eosinophils Absolute: 0.1 10*3/uL (ref 0.0–0.5)
Eosinophils Relative: 2 %
HCT: 43.4 % (ref 39.0–52.0)
Hemoglobin: 14.3 g/dL (ref 13.0–17.0)
Immature Granulocytes: 0 %
Lymphocytes Relative: 34 %
Lymphs Abs: 2.1 10*3/uL (ref 0.7–4.0)
MCH: 30.4 pg (ref 26.0–34.0)
MCHC: 32.9 g/dL (ref 30.0–36.0)
MCV: 92.1 fL (ref 80.0–100.0)
Monocytes Absolute: 0.5 10*3/uL (ref 0.1–1.0)
Monocytes Relative: 8 %
Neutro Abs: 3.5 10*3/uL (ref 1.7–7.7)
Neutrophils Relative %: 56 %
Platelets: 149 10*3/uL — ABNORMAL LOW (ref 150–400)
RBC: 4.71 MIL/uL (ref 4.22–5.81)
RDW: 14.9 % (ref 11.5–15.5)
WBC: 6.2 10*3/uL (ref 4.0–10.5)
nRBC: 0 % (ref 0.0–0.2)

## 2023-06-24 LAB — RETICULOCYTES
Immature Retic Fract: 5.9 % (ref 2.3–15.9)
RBC.: 4.63 MIL/uL (ref 4.22–5.81)
Retic Count, Absolute: 26.9 10*3/uL (ref 19.0–186.0)
Retic Ct Pct: 0.6 % (ref 0.4–3.1)

## 2023-06-24 LAB — HEPATITIS B SURFACE ANTIGEN: Hepatitis B Surface Ag: NONREACTIVE

## 2023-06-24 LAB — HEPATITIS B SURFACE ANTIBODY,QUALITATIVE: Hep B S Ab: REACTIVE — AB

## 2023-06-24 LAB — HEPATITIS C ANTIBODY: HCV Ab: NONREACTIVE

## 2023-06-24 LAB — IMMATURE PLATELET FRACTION: Immature Platelet Fraction: 5.6 % (ref 1.2–8.6)

## 2023-06-24 LAB — HEPATITIS B CORE ANTIBODY, TOTAL: Hep B Core Total Ab: REACTIVE — AB

## 2023-06-24 LAB — LACTATE DEHYDROGENASE: LDH: 129 U/L (ref 98–192)

## 2023-06-24 NOTE — Patient Instructions (Signed)
You were seen and examined today by Dr. Ellin Saba. Dr. Ellin Saba is a hematologist, meaning that he specializes in blood abnormalities. Dr. Ellin Saba discussed your past medical history, family history of cancers/blood conditions and the events that led to you being here today.  You were referred to Dr. Ellin Saba due to thrombocytopenia (low platelet count).  Dr. Ellin Saba has recommended additional labs today for further evaluation.  Follow-up as scheduled.

## 2023-06-24 NOTE — Progress Notes (Signed)
[facility] [address], [place] [address]   Clinic Day:  06/24/2023  Referring physician: Raliegh Ip, DO  Patient Care Team: Raliegh Ip, DO as PCP - General (Family Medicine) Heloise Purpura, MD as Consulting Physician (Urology) Pyrtle, Carie Caddy, MD as Consulting Physician (Gastroenterology) Ernesto Rutherford, MD as Consulting Physician (Ophthalmology) Dyane Dustman Renford Dills., MD as Referring Physician (Cardiology)   ASSESSMENT & PLAN:   Assessment:  1.  Mild intermittent thrombocytopenia: - Patient seen for intermittent mild thrombocytopenia since 2013. - CBC on 06/04/2023 PLT-125.  Normal Hb and WBC.  B12 and folate normal. - Denies any easy bruising.  Denies any nosebleeds. - He is on Coumadin for 20 years for chronic A-fib.  He reportedly had dizzy spells with Eliquis and could not tolerate it when they tried to switch. - CTA PE (11/05/2021): Spleen size normal.  No B symptoms.  2.  Social/family history: - Lives with son and grandson at home.  Independent of ADLs and IADLs.  He served in the [employer] for 27 years and participated in 2 tours in [place].  Exposure to agent orange present.  Quit smoking in 1968.  Worked at [employer] after [employer]. - Half brother had multiple myeloma.  Maternal uncle had lung cancer.  Plan:  1.  Mild intermittent thrombocytopenia: - We discussed differential diagnosis including immune mediated thrombocytopenia and early MDS. - Recommend checking MMA, copper, SPEP, immunofixation, free light chains.  Will also check for infectious and connective tissue disorders. - Also discussed the role of bone marrow biopsy but would hold off as he has mild thrombocytopenia. - RTC 3 weeks for follow-up.   Orders Placed This Encounter  Procedures   Copper, serum    Standing Status:   Future    Number of Occurrences:   1    Standing Expiration Date:   06/23/2024   CBC with Differential    Standing  Status:   Future    Number of Occurrences:   1    Standing Expiration Date:   06/23/2024   Methylmalonic acid, serum    Standing Status:   Future    Number of Occurrences:   1    Standing Expiration Date:   06/23/2024   Kappa/lambda light chains    Standing Status:   Future    Number of Occurrences:   1    Standing Expiration Date:   06/23/2024   Immunofixation electrophoresis    Standing Status:   Future    Number of Occurrences:   1    Standing Expiration Date:   06/23/2024   Protein electrophoresis, serum    Standing Status:   Future    Number of Occurrences:   1    Standing Expiration Date:   06/23/2024   Hepatitis B surface antibody    Standing Status:   Future    Number of Occurrences:   1    Standing Expiration Date:   06/23/2024   Hepatitis B surface antigen    Standing Status:   Future    Number of Occurrences:   1    Standing Expiration Date:   06/23/2024   Hepatitis C Antibody    Standing Status:   Future    Number of Occurrences:   1    Standing Expiration Date:   06/23/2024   Hepatitis B core antibody, total    Standing Status:   Future    Number of Occurrences:   1  Standing Expiration Date:   06/23/2024   ANA, IFA (with reflex)    Standing Status:   Future    Number of Occurrences:   1    Standing Expiration Date:   06/23/2024   Rheumatoid factor    Standing Status:   Future    Number of Occurrences:   1    Standing Expiration Date:   06/23/2024   Immature Platelet Fraction    Standing Status:   Future    Number of Occurrences:   1    Standing Expiration Date:   06/23/2024   Lactate dehydrogenase    Standing Status:   Future    Number of Occurrences:   1    Standing Expiration Date:   06/23/2024   Reticulocytes    Standing Status:   Future    Number of Occurrences:   1    Standing Expiration Date:   06/23/2024      Alben Deeds Teague,acting as a scribe for Doreatha Massed, MD.,have documented all relevant documentation on the behalf of Doreatha Massed, MD,as  directed by  Doreatha Massed, MD while in the presence of Doreatha Massed, MD.   I, Doreatha Massed MD, have reviewed the above documentation for accuracy and completeness, and I agree with the above.   Doreatha Massed, MD   7/8/20245:09 PM  CHIEF COMPLAINT/PURPOSE OF CONSULT:   Diagnosis: Mild thrombocytopenia   Cancer Staging  No matching staging information was found for the patient.   Prior Therapy: None  Current Therapy: Under workup   HISTORY OF PRESENT ILLNESS:   Oncology History   No history exists.      Dalton Vaughan is a 89 y.o. male presenting to clinic today for evaluation of low platelets at the request of Raliegh Ip, DO.  Today, he states that he is doing well overall. His appetite level is at 100%. His energy level is at 70%.  Patient was recently in the ED at [facility] on 06/01/23 due to a medication reaction, specifically meclizine, and has been discharged.   He notes this visit has been the first time he was told he had low platelets.   He notes he does not have gum bleeding, but does bleed profusely when he scratches himself. He denies being on Lasix, but does take other blood thinner.  He reports being on warfarin for 20 years due to AFIB. He notes he was offered a newer medication he believes was Eliquis 10-11 years ago, but went back to Warfarin because Eliquis caused dizziness and vertigo.   He c/o of vertigo and dizziness starting 2-3 weeks ago. He has been taking Metyrosine since then   Pt lives with son and grandson. He is able to do activities around the house without assistance. 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  He notes he has exposure to agent orange from deployment while in the [employer] and possibly other types of radiation from when he worked in [employer] and helped Environmental education officer. He reports that he quit smoking in 1968.  He notes his half brother has multiple myeloma  and his maternal uncle had lung cancer, but denies any other family history of cancer.  He denies fever, weight loss, or night sweats. He does note his weight fluctuates slightly depending on his diet.   PAST MEDICAL HISTORY:   Past Medical History: Past Medical History:  Diagnosis Date   Anal fissure    Arthritis  Atrial fibrillation (HCC)    BPH (benign prostatic hypertrophy)    CAD (coronary artery disease)    Dr. Donnie Aho   Cataract    COVID-19    Depression 2004   Diverticulosis    GERD (gastroesophageal reflux disease)    History of TIAs    Hyperlipidemia    Hypertension    Hypertensive heart disease without CHF    Hypothyroidism    Internal hemorrhoids    Lumbar disc disease    Oral bleeding 08/20/2012   Following molar tooth extraction July, 2013   Pneumonia    Ruptured disk 1995   Shingles    Thyroid disease    Vitamin D deficiency    Vitreous detachment     Surgical History: Past Surgical History:  Procedure Laterality Date   bilateral cataract surgery  6/07   CARDIAC CATHETERIZATION     CORONARY ARTERY BYPASS GRAFT  12/99    Dr. Tyrone Sage    cysloscopy  8/08   EYE SURGERY     HERNIA REPAIR  1997   right   herninated disc  1995   PACEMAKER INSERTION  11/03/2020   PROSTATE BIOPSY  8/08   TONSILLECTOMY AND ADENOIDECTOMY      Social History: Social History   Socioeconomic History   Marital status: Widowed    Spouse name: Harriett Sine    Number of children: 1   Years of education: 12   Highest education level: Not on file  Occupational History   Occupation: Retired    Veterinary surgeon: Company secretary x 27 years   Occupation: Retired    Associate Professor: Public librarian    Comment: supervisor x 17 years  Tobacco Use   Smoking status: Former    Packs/day: 1.00    Years: 18.00    Additional pack years: 0.00    Total pack years: 18.00    Types: Cigarettes    Start date: 12/17/1948    Quit date: 12/17/1966    Years since quitting: 56.5   Smokeless tobacco: Never  Vaping  Use   Vaping Use: Never used  Substance and Sexual Activity   Alcohol use: Yes    Alcohol/week: 1.0 standard drink of alcohol    Types: 1 Standard drinks or equivalent per week    Comment: rare   Drug use: No   Sexual activity: Not on file  Other Topics Concern   Not on file  Social History Narrative   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[employer]      Right-handed      Caffeine use: none      Son and grandson live with him   Social Determinants of Health   Financial Resource Strain: Low Risk  (03/05/2023)   Overall Financial Resource Strain (CARDIA)    Difficulty of Paying Living Expenses: Not hard at all  Food Insecurity: No Food Insecurity (06/24/2023)   Hunger Vital Sign    Worried About Running Out of Food in the Last Year: Never true    Ran Out of Food in the Last Year: Never true  Transportation Needs: No Transportation Needs (06/24/2023)   PRAPARE - Administrator, Civil Service (Medical): No    Lack of Transportation (Non-Medical): No  Physical Activity: Sufficiently Active (03/05/2023)   Exercise Vital Sign    Days of Exercise per Week: 5 days    Minutes of Exercise per Session: 60 min  Stress: No Stress Concern Present (03/05/2023)   [employer] of Occupational  Health - Occupational Stress Questionnaire    Feeling of Stress : Not at all  Social Connections: Socially Isolated (03/05/2023)   Social Connection and Isolation Panel [NHANES]    Frequency of Communication with Friends and Family: More than three times a week    Frequency of Social Gatherings with Friends and Family: More than three times a week    Attends Religious Services: Never    Database administrator or Organizations: No    Attends Banker Meetings: Never    Marital Status: Widowed  Intimate Partner Violence: Not At Risk (06/24/2023)   Humiliation, Afraid, Rape, and Kick questionnaire    Fear of Current or Ex-Partner: No    Emotionally Abused: No     Physically Abused: No    Sexually Abused: No    Family History: Family History  Problem Relation Age of Onset   Dementia Mother    Arthritis Mother    Cancer Brother        prostate   Asthma Sister    Heart disease Paternal Aunt    Heart disease Paternal Uncle    Hypertension Maternal Grandmother    CVA Maternal Grandmother    CVA Paternal Grandmother    Hepatitis C Son    Arthritis Son    Colon cancer Neg Hx    Colon polyps Neg Hx    Kidney disease Neg Hx    Esophageal cancer Neg Hx    Gallbladder disease Neg Hx    Diabetes Neg Hx     Current Medications:  Current Outpatient Medications:    albuterol (VENTOLIN HFA) 108 (90 Base) MCG/ACT inhaler, Inhale 2 puffs into the lungs every 4 (four) hours as needed., Disp: , Rfl:    amLODipine (NORVASC) 5 MG tablet, TAKE 1 TABLET DAILY, Disp: 90 tablet, Rfl: 0   azelastine (ASTELIN) 0.1 % nasal spray, Place 1 spray into both nostrils 2 (two) times daily., Disp: 90 mL, Rfl: 4   benzonatate (TESSALON PERLES) 100 MG capsule, Take 1 capsule (100 mg total) by mouth 3 (three) times daily as needed for cough., Disp: 20 capsule, Rfl: 0   Calcium Carb-Cholecalciferol (CALCIUM + D3) 600-200 MG-UNIT TABS, Take 1 tablet by mouth daily. , Disp: , Rfl:    cetirizine (ZYRTEC ALLERGY) 10 MG tablet, Take 0.5 tablets (5 mg total) by mouth at bedtime as needed for allergies (drainage)., Disp: 90 tablet, Rfl: 3   Cholecalciferol (VITAMIN D-3) 5000 UNITS TABS, Take 1 tablet by mouth daily., Disp: , Rfl:    doxazosin (CARDURA) 8 MG tablet, Take 1 tablet (8 mg total) by mouth at bedtime., Disp: 90 tablet, Rfl: 3   furosemide (LASIX) 40 MG tablet, Take by mouth., Disp: , Rfl:    levothyroxine (SYNTHROID) 50 MCG tablet, TAKE 1 TABLET DAILY BEFORE BREAKFAST, Disp: 90 tablet, Rfl: 2   lidocaine (XYLOCAINE) 5 % ointment, Apply 1 application topically as needed., Disp: 35.44 g, Rfl: 0   linaclotide (LINZESS) 145 MCG CAPS capsule, Take 1 capsule (145 mcg  total) by mouth daily before breakfast., Disp: 8 capsule, Rfl: 0   meclizine (ANTIVERT) 25 MG tablet, Take 25 mg by mouth daily as needed for dizziness., Disp: , Rfl:    mirabegron ER (MYRBETRIQ) 25 MG TB24 tablet, Take 1 tablet (25 mg total) by mouth daily., Disp: 45 tablet, Rfl: 0   mirtazapine (REMERON) 15 MG tablet, TAKE 1 TABLET AT BEDTIME, Disp: 90 tablet, Rfl: 0   Multiple Vitamin (MULTIVITAMIN) tablet, Take 1 tablet  by mouth daily., Disp: , Rfl:    ondansetron (ZOFRAN-ODT) 4 MG disintegrating tablet, Take 1 tablet (4 mg total) by mouth every 8 (eight) hours as needed for nausea or vomiting., Disp: 20 tablet, Rfl: 0   ramipril (ALTACE) 10 MG capsule, TAKE 1 CAPSULE DAILY, Disp: 90 capsule, Rfl: 0   rosuvastatin (CRESTOR) 5 MG tablet, Take 1 tablet by mouth Mondays, Wednesdays and Fridays, Disp: 36 tablet, Rfl: 3   SIMETHICONE PO, Take by mouth as needed., Disp: , Rfl:    triamterene-hydrochlorothiazide (MAXZIDE-25) 37.5-25 MG tablet, Take 1 tablet by mouth daily., Disp: 90 tablet, Rfl: 3   vitamin C (ASCORBIC ACID) 500 MG tablet, Take 500 mg by mouth daily. , Disp: , Rfl:    warfarin (COUMADIN) 5 MG tablet, TAKE 1 TABLET DAILY AS DIRECTED, Disp: 90 tablet, Rfl: 3   XIIDRA 5 % SOLN, Apply 1 drop to eye as needed. , Disp: , Rfl:    triamterene-hydrochlorothiazide (MAXZIDE-25) 37.5-25 MG tablet, Take 1 tablet by mouth daily. [facility]. Waiting on mail order, Disp: 30 tablet, Rfl: 0   Allergies: Allergies  Allergen Reactions   Paxil [Paroxetine Hcl] Palpitations   Codeine Other (See Comments)   Eliquis [Apixaban] Other (See Comments)    dizziness   Penicillins Other (See Comments)    Did it involve swelling of the face/tongue/throat, SOB, or low BP? Unknown Did it involve sudden or severe rash/hives, skin peeling, or any reaction on the inside of your mouth or nose? Unknown Did you need to seek medical attention at a hospital or doctor's office? Unknown When did it last happen?       unk If all above answers are "NO", may proceed with cephalosporin use.    Pravachol [Pravastatin Sodium] Other (See Comments)    myalgias   Zetia [Ezetimibe] Other (See Comments)    myalgias   Vytorin [Ezetimibe-Simvastatin] Other (See Comments)    myalgias    REVIEW OF SYSTEMS:   Review of Systems  Constitutional:  Negative for chills, fatigue and fever.  HENT:   Negative for lump/mass, mouth sores, nosebleeds, sore throat and trouble swallowing.   Eyes:  Negative for eye problems.  Respiratory:  Negative for cough and shortness of breath.   Cardiovascular:  Negative for chest pain, leg swelling and palpitations.  Gastrointestinal:  Negative for abdominal pain, constipation, diarrhea, nausea and vomiting.  Genitourinary:  Negative for bladder incontinence, difficulty urinating, dysuria, frequency, hematuria and nocturia.   Musculoskeletal:  Negative for arthralgias, back pain, flank pain, myalgias and neck pain.  Skin:  Negative for itching and rash.  Neurological:  Positive for dizziness. Negative for headaches and numbness.  Hematological:  Does not bruise/bleed easily.  Psychiatric/Behavioral:  Negative for depression, sleep disturbance and suicidal ideas. The patient is not nervous/anxious.   All other systems reviewed and are negative.    VITALS:   Blood pressure 125/71, pulse 79, temperature 97.6 F (36.4 C), temperature source Oral, resp. rate 16, height 6\' 1"  (1.854 m), weight 167 lb (75.8 kg), SpO2 99 %.  Wt Readings from Last 3 Encounters:  06/24/23 167 lb (75.8 kg)  06/04/23 166 lb (75.3 kg)  05/03/23 169 lb (76.7 kg)    Body mass index is 22.03 kg/m.  Performance status (ECOG): 1 - Symptomatic but completely ambulatory  PHYSICAL EXAM:   Physical Exam Vitals and nursing note reviewed. Exam conducted with a chaperone present.  Constitutional:      Appearance: Normal appearance.  Cardiovascular:  Rate and Rhythm: Normal rate and regular rhythm.      Pulses: Normal pulses.     Heart sounds: Normal heart sounds.  Pulmonary:     Effort: Pulmonary effort is normal.     Breath sounds: Normal breath sounds.  Abdominal:     Palpations: Abdomen is soft. There is no hepatomegaly, splenomegaly or mass.     Tenderness: There is no abdominal tenderness.  Musculoskeletal:     Right lower leg: No edema.     Left lower leg: No edema.  Lymphadenopathy:     Cervical: No cervical adenopathy.     Right cervical: No superficial, deep or posterior cervical adenopathy.    Left cervical: No superficial, deep or posterior cervical adenopathy.     Upper Body:     Right upper body: No supraclavicular or axillary adenopathy.     Left upper body: No supraclavicular or axillary adenopathy.  Neurological:     General: No focal deficit present.     Mental Status: He is alert and oriented to person, place, and time.  Psychiatric:        Mood and Affect: Mood normal.        Behavior: Behavior normal.     LABS:      Latest Ref Rng & Units 06/24/2023   12:02 PM 06/04/2023   11:51 AM 02/12/2023   10:00 AM  CBC  WBC 4.0 - 10.5 K/uL 6.2  5.4  6.8   Hemoglobin 13.0 - 17.0 g/dL 16.1  09.6  04.5   Hematocrit 39.0 - 52.0 % 43.4  40.9  43.1   Platelets 150 - 400 K/uL 149  125  182       Latest Ref Rng & Units 01/01/2023   10:10 AM 12/20/2021   10:00 AM 11/08/2021   10:27 AM  CMP  Glucose 70 - 99 mg/dL 79  86  90   BUN 10 - 36 mg/dL 12  13  8    Creatinine 0.76 - 1.27 mg/dL 4.09  8.11  9.14   Sodium 134 - 144 mmol/L 141  139  136   Potassium 3.5 - 5.2 mmol/L 4.0  3.5  3.5   Chloride 96 - 106 mmol/L 101  100  99   CO2 20 - 29 mmol/L 27  28  31    Calcium 8.6 - 10.2 mg/dL 9.5  9.5  9.4   Total Protein 6.0 - 8.5 g/dL 6.5  6.4    Total Bilirubin 0.0 - 1.2 mg/dL 0.5  0.7    Alkaline Phos 44 - 121 IU/L 125  137    AST 0 - 40 IU/L 17  30    ALT 0 - 44 IU/L 13  25       No results found for: "CEA1", "CEA" / No results found for: "CEA1", "CEA" Lab Results   Component Value Date   PSA1 32.4 (H) 02/22/2021   No results found for: "NWG956" No results found for: "CAN125"  No results found for: "TOTALPROTELP", "ALBUMINELP", "A1GS", "A2GS", "BETS", "BETA2SER", "GAMS", "MSPIKE", "SPEI" Lab Results  Component Value Date   TIBC 302 06/04/2023   FERRITIN 136 06/04/2023   FERRITIN 726 (H) 09/16/2019   FERRITIN 855 (H) 09/15/2019   IRONPCTSAT 30 06/04/2023   Lab Results  Component Value Date   LDH 129 06/24/2023     STUDIES:   No results found.

## 2023-06-25 LAB — KAPPA/LAMBDA LIGHT CHAINS
Kappa free light chain: 18.2 mg/L (ref 3.3–19.4)
Kappa, lambda light chain ratio: 1.42 (ref 0.26–1.65)
Lambda free light chains: 12.8 mg/L (ref 5.7–26.3)

## 2023-06-25 LAB — ANTINUCLEAR ANTIBODIES, IFA: ANA Ab, IFA: NEGATIVE

## 2023-06-26 LAB — PROTEIN ELECTROPHORESIS, SERUM
A/G Ratio: 1.4 (ref 0.7–1.7)
Albumin ELP: 4.3 g/dL (ref 2.9–4.4)
Alpha-1-Globulin: 0.3 g/dL (ref 0.0–0.4)
Alpha-2-Globulin: 0.7 g/dL (ref 0.4–1.0)
Beta Globulin: 1.1 g/dL (ref 0.7–1.3)
Gamma Globulin: 1 g/dL (ref 0.4–1.8)
Globulin, Total: 3.1 g/dL (ref 2.2–3.9)
Total Protein ELP: 7.4 g/dL (ref 6.0–8.5)

## 2023-06-26 LAB — COPPER, SERUM: Copper: 95 ug/dL (ref 69–132)

## 2023-06-26 LAB — METHYLMALONIC ACID, SERUM: Methylmalonic Acid, Quantitative: 240 nmol/L (ref 0–378)

## 2023-06-26 LAB — RHEUMATOID FACTOR: Rheumatoid fact SerPl-aCnc: 12.8 IU/mL (ref <14.0)

## 2023-06-27 LAB — IMMUNOFIXATION ELECTROPHORESIS
IgA: 191 mg/dL (ref 61–437)
IgG (Immunoglobin G), Serum: 997 mg/dL (ref 603–1613)
IgM (Immunoglobulin M), Srm: 57 mg/dL (ref 15–143)
Total Protein ELP: 6.6 g/dL (ref 6.0–8.5)

## 2023-06-28 ENCOUNTER — Ambulatory Visit (INDEPENDENT_AMBULATORY_CARE_PROVIDER_SITE_OTHER): Payer: Medicare Other | Admitting: Family Medicine

## 2023-06-28 ENCOUNTER — Encounter: Payer: Self-pay | Admitting: Family Medicine

## 2023-06-28 VITALS — BP 131/70 | HR 81 | Temp 98.2°F | Ht 73.0 in | Wt 168.4 lb

## 2023-06-28 DIAGNOSIS — Z7901 Long term (current) use of anticoagulants: Secondary | ICD-10-CM

## 2023-06-28 DIAGNOSIS — R791 Abnormal coagulation profile: Secondary | ICD-10-CM

## 2023-06-28 DIAGNOSIS — I482 Chronic atrial fibrillation, unspecified: Secondary | ICD-10-CM

## 2023-06-28 DIAGNOSIS — R42 Dizziness and giddiness: Secondary | ICD-10-CM

## 2023-06-28 LAB — COAGUCHEK XS/INR WAIVED
INR: 3.6 — ABNORMAL HIGH (ref 0.9–1.1)
Prothrombin Time: 43.7 s

## 2023-06-28 NOTE — Patient Instructions (Signed)
CUT Doxazosin in HALF. And only take 1/2 tablet at night for now. I think your blood pressure is dropping causing this dizziness Drink plenty of water. SKIP today's, Saturday's and Sunday's coumadin.  Then go back to your normal dosing schedule.  Orthostatic Hypotension Blood pressure is a measurement of how strongly, or weakly, your circulating blood is pressing against the walls of your arteries. Orthostatic hypotension is a drop in blood pressure that can happen when you change positions, such as when you go from lying down to standing. Arteries are blood vessels that carry blood from your heart throughout your body. When blood pressure is too low, you may not get enough blood to your brain or to the rest of your organs. Orthostatic hypotension can cause light-headedness, sweating, rapid heartbeat, blurred vision, and fainting. These symptoms require further investigation into the cause. What are the causes? Orthostatic hypotension can be caused by many things, including: Sudden changes in posture, such as standing up quickly after you have been sitting or lying down. Loss of blood (anemia) or loss of body fluids (dehydration). Heart problems, neurologic problems, or hormone problems. Pregnancy. Aging. The risk for this condition increases as you get older. Severe infection (sepsis). Certain medicines, such as medicines for high blood pressure or medicines that make the body lose excess fluids (diuretics). What are the signs or symptoms? Symptoms of this condition may include: Weakness, light-headedness, or dizziness. Sweating. Blurred vision. Tiredness (fatigue). Rapid heartbeat. Fainting, in severe cases. How is this diagnosed? This condition is diagnosed based on: Your symptoms and medical history. Your blood pressure measurements. Your health care provider will check your blood pressure when you are: Lying down. Sitting. Standing. A blood pressure reading is recorded as two  numbers, such as "120 over 80" (or 120/80). The first ("top") number is called the systolic pressure. It is a measure of the pressure in your arteries as your heart beats. The second ("bottom") number is called the diastolic pressure. It is a measure of the pressure in your arteries when your heart relaxes between beats. Blood pressure is measured in a unit called mmHg. Healthy blood pressure for most adults is 120/80 mmHg. Orthostatic hypotension is defined as a 20 mmHg drop in systolic pressure or a 10 mmHg drop in diastolic pressure within 3 minutes of standing. Other information or tests that may be used to diagnose orthostatic hypotension include: Your other vital signs, such as your heart rate and temperature. Blood tests. An electrocardiogram (ECG) or echocardiogram. A Holter monitor. This is a device you wear that records your heart rhythm continuously, usually for 24-48 hours. Tilt table test. For this test, you will be safely secured to a table that moves you from a lying position to an upright position. Your heart rhythm and blood pressure will be monitored during the test. How is this treated? This condition may be treated by: Changing your diet. This may involve eating more salt (sodium) or drinking more water. Changing the dosage of certain medicines you are taking that might be lowering your blood pressure. Correcting the underlying reason for the orthostatic hypotension. Wearing compression stockings. Taking medicines to raise your blood pressure. Avoiding actions that trigger symptoms. Follow these instructions at home: Medicines Take over-the-counter and prescription medicines only as told by your health care provider. Follow instructions from your health care provider about changing the dosage of your current medicines, if this applies. Do not stop or adjust any of your medicines on your own. Eating and drinking  Drink enough fluid to keep your urine pale yellow. Eat extra  salt only as directed. Do not add extra salt to your diet unless advised by your health care provider. Eat frequent, small meals. Avoid standing up suddenly after eating. General instructions  Get up slowly from lying down or sitting positions. This gives your blood pressure a chance to adjust. Avoid hot showers and excessive heat as directed by your health care provider. Engage in regular physical activity as directed by your health care provider. If you have compression stockings, wear them as told. Keep all follow-up visits. This is important. Contact a health care provider if: You have a fever for more than 2-3 days. You feel more thirsty than usual. You feel dizzy or weak. Get help right away if: You have chest pain. You have a fast or irregular heartbeat. You become sweaty or feel light-headed. You feel short of breath. You faint. You have any symptoms of a stroke. "BE FAST" is an easy way to remember the main warning signs of a stroke: B - Balance. Signs are dizziness, sudden trouble walking, or loss of balance. E - Eyes. Signs are trouble seeing or a sudden change in vision. F - Face. Signs are sudden weakness or numbness of the face, or the face or eyelid drooping on one side. A - Arms. Signs are weakness or numbness in an arm. This happens suddenly and usually on one side of the body. S - Speech. Signs are sudden trouble speaking, slurred speech, or trouble understanding what people say. T - Time. Time to call emergency services. Write down what time symptoms started. You have other signs of a stroke, such as: A sudden, severe headache with no known cause. Nausea or vomiting. Seizure. These symptoms may represent a serious problem that is an emergency. Do not wait to see if the symptoms will go away. Get medical help right away. Call your local emergency services (911 in the [place] Do not drive yourself to the hospital. Summary Orthostatic hypotension is a sudden drop in  blood pressure. It can cause light-headedness, sweating, rapid heartbeat, blurred vision, and fainting. Orthostatic hypotension can be diagnosed by having your blood pressure taken while lying down, sitting, and then standing. Treatment may involve changing your diet, wearing compression stockings, sitting up slowly, adjusting your medicines, or correcting the underlying reason for the orthostatic hypotension. Get help right away if you have chest pain, a fast or irregular heartbeat, or symptoms of a stroke. This information is not intended to replace advice given to you by your health care provider. Make sure you discuss any questions you have with your health care provider. Document Revised: 02/16/2021 Document Reviewed: 02/16/2021 Elsevier Patient Education  2024 [employer].

## 2023-06-28 NOTE — Progress Notes (Signed)
Subjective: CC:INR check PCP: Raliegh Ip, DO HPI:Dalton Vaughan is a 89 y.o. male presenting to clinic today for:  1. Afib Goal INR 2-3 Patient is compliant with Coumadin 1/2 tablets on Mondays and Fridays and 1 tablet daily all others.  Since our last visit he has been having pretty much daily episodes of dizziness that started sometime in June.  He was seen on 06/04/2023 and diagnosed with vertigo.  He has had no falls but has felt unsteady on his feet.  He has been taking meclizine for symptoms and thinks that perhaps this is impacted his Coumadin level.  He denies any abnormal bleeding episodes however  Lab Results  Component Value Date   INR 2.3 (H) 05/03/2023   INR 3.0 (H) 02/27/2023   INR 3.8 (H) 02/12/2023      ROS: Per HPI  Allergies  Allergen Reactions   Paxil [Paroxetine Hcl] Palpitations   Codeine Other (See Comments)   Eliquis [Apixaban] Other (See Comments)    dizziness   Penicillins Other (See Comments)    Did it involve swelling of the face/tongue/throat, SOB, or low BP? Unknown Did it involve sudden or severe rash/hives, skin peeling, or any reaction on the inside of your mouth or nose? Unknown Did you need to seek medical attention at a hospital or doctor's office? Unknown When did it last happen?      unk If all above answers are "NO", may proceed with cephalosporin use.    Pravachol [Pravastatin Sodium] Other (See Comments)    myalgias   Zetia [Ezetimibe] Other (See Comments)    myalgias   Vytorin [Ezetimibe-Simvastatin] Other (See Comments)    myalgias   Past Medical History:  Diagnosis Date   Anal fissure    Arthritis    Atrial fibrillation (HCC)    BPH (benign prostatic hypertrophy)    CAD (coronary artery disease)    Dr. Donnie Aho   Cataract    COVID-19    Depression 2004   Diverticulosis    GERD (gastroesophageal reflux disease)    History of TIAs    Hyperlipidemia    Hypertension    Hypertensive heart disease without CHF     Hypothyroidism    Internal hemorrhoids    Lumbar disc disease    Oral bleeding 08/20/2012   Following molar tooth extraction July, 2013   Pneumonia    Ruptured disk 1995   Shingles    Thyroid disease    Vitamin D deficiency    Vitreous detachment     Current Outpatient Medications:    albuterol (VENTOLIN HFA) 108 (90 Base) MCG/ACT inhaler, Inhale 2 puffs into the lungs every 4 (four) hours as needed., Disp: , Rfl:    amLODipine (NORVASC) 5 MG tablet, TAKE 1 TABLET DAILY, Disp: 90 tablet, Rfl: 0   azelastine (ASTELIN) 0.1 % nasal spray, Place 1 spray into both nostrils 2 (two) times daily., Disp: 90 mL, Rfl: 4   benzonatate (TESSALON PERLES) 100 MG capsule, Take 1 capsule (100 mg total) by mouth 3 (three) times daily as needed for cough., Disp: 20 capsule, Rfl: 0   Calcium Carb-Cholecalciferol (CALCIUM + D3) 600-200 MG-UNIT TABS, Take 1 tablet by mouth daily. , Disp: , Rfl:    cetirizine (ZYRTEC ALLERGY) 10 MG tablet, Take 0.5 tablets (5 mg total) by mouth at bedtime as needed for allergies (drainage)., Disp: 90 tablet, Rfl: 3   Cholecalciferol (VITAMIN D-3) 5000 UNITS TABS, Take 1 tablet by mouth daily., Disp: , Rfl:  doxazosin (CARDURA) 8 MG tablet, Take 1 tablet (8 mg total) by mouth at bedtime., Disp: 90 tablet, Rfl: 3   furosemide (LASIX) 40 MG tablet, Take by mouth., Disp: , Rfl:    levothyroxine (SYNTHROID) 50 MCG tablet, TAKE 1 TABLET DAILY BEFORE BREAKFAST, Disp: 90 tablet, Rfl: 2   lidocaine (XYLOCAINE) 5 % ointment, Apply 1 application topically as needed., Disp: 35.44 g, Rfl: 0   linaclotide (LINZESS) 145 MCG CAPS capsule, Take 1 capsule (145 mcg total) by mouth daily before breakfast., Disp: 8 capsule, Rfl: 0   meclizine (ANTIVERT) 25 MG tablet, Take 25 mg by mouth daily as needed for dizziness., Disp: , Rfl:    mirabegron ER (MYRBETRIQ) 25 MG TB24 tablet, Take 1 tablet (25 mg total) by mouth daily., Disp: 45 tablet, Rfl: 0   mirtazapine (REMERON) 15 MG tablet, TAKE 1 TABLET  AT BEDTIME, Disp: 90 tablet, Rfl: 0   Multiple Vitamin (MULTIVITAMIN) tablet, Take 1 tablet by mouth daily., Disp: , Rfl:    ondansetron (ZOFRAN-ODT) 4 MG disintegrating tablet, Take 1 tablet (4 mg total) by mouth every 8 (eight) hours as needed for nausea or vomiting., Disp: 20 tablet, Rfl: 0   ramipril (ALTACE) 10 MG capsule, TAKE 1 CAPSULE DAILY, Disp: 90 capsule, Rfl: 0   rosuvastatin (CRESTOR) 5 MG tablet, Take 1 tablet by mouth Mondays, Wednesdays and Fridays, Disp: 36 tablet, Rfl: 3   SIMETHICONE PO, Take by mouth as needed., Disp: , Rfl:    triamterene-hydrochlorothiazide (MAXZIDE-25) 37.5-25 MG tablet, Take 1 tablet by mouth daily., Disp: 90 tablet, Rfl: 3   triamterene-hydrochlorothiazide (MAXZIDE-25) 37.5-25 MG tablet, Take 1 tablet by mouth daily. [facility]. Waiting on mail order, Disp: 30 tablet, Rfl: 0   vitamin C (ASCORBIC ACID) 500 MG tablet, Take 500 mg by mouth daily. , Disp: , Rfl:    warfarin (COUMADIN) 5 MG tablet, TAKE 1 TABLET DAILY AS DIRECTED, Disp: 90 tablet, Rfl: 3   XIIDRA 5 % SOLN, Apply 1 drop to eye as needed. , Disp: , Rfl:  Social History   Socioeconomic History   Marital status: Widowed    Spouse name: Harriett Sine    Number of children: 1   Years of education: 12   Highest education level: Not on file  Occupational History   Occupation: Retired    Veterinary surgeon: Company secretary x 27 years   Occupation: Retired    Associate Professor: Public librarian    Comment: supervisor x 17 years  Tobacco Use   Smoking status: Former    Current packs/day: 0.00    Average packs/day: 1 pack/day for 18.0 years (18.0 ttl pk-yrs)    Types: Cigarettes    Start date: 12/17/1948    Quit date: 12/17/1966    Years since quitting: 56.5   Smokeless tobacco: Never  Vaping Use   Vaping status: Never Used  Substance and Sexual Activity   Alcohol use: Yes    Alcohol/week: 1.0 standard drink of alcohol    Types: 1 Standard drinks or equivalent per week    Comment: rare   Drug use: No   Sexual  activity: Not on file  Other Topics Concern   Not on file  Social History Narrative   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[employer]      Right-handed      Caffeine use: none      Son and grandson live with him   Social Determinants of Health   Financial Resource Strain: Low Risk  (  05/06/2023)   Received from [facility]   Overall Financial Resource Strain (CARDIA)    Difficulty of Paying Living Expenses: Not very hard  Food Insecurity: No Food Insecurity (06/24/2023)   Hunger Vital Sign    Worried About Running Out of Food in the Last Year: Never true    Ran Out of Food in the Last Year: Never true  Transportation Needs: No Transportation Needs (06/24/2023)   PRAPARE - Administrator, Civil Service (Medical): No    Lack of Transportation (Non-Medical): No  Physical Activity: Sufficiently Active (03/05/2023)   Exercise Vital Sign    Days of Exercise per Week: 5 days    Minutes of Exercise per Session: 60 min  Stress: No Stress Concern Present (03/05/2023)   [employer] of Occupational Health - Occupational Stress Questionnaire    Feeling of Stress : Not at all  Social Connections: Socially Isolated (03/05/2023)   Social Connection and Isolation Panel [NHANES]    Frequency of Communication with Friends and Family: More than three times a week    Frequency of Social Gatherings with Friends and Family: More than three times a week    Attends Religious Services: Never    Database administrator or Organizations: No    Attends Banker Meetings: Never    Marital Status: Widowed  Intimate Partner Violence: Not At Risk (06/24/2023)   Humiliation, Afraid, Rape, and Kick questionnaire    Fear of Current or Ex-Partner: No    Emotionally Abused: No    Physically Abused: No    Sexually Abused: No   Family History  Problem Relation Age of Onset   Dementia Mother    Arthritis Mother    Cancer Brother        prostate    Asthma Sister    Heart disease Paternal Aunt    Heart disease Paternal Uncle    Hypertension Maternal Grandmother    CVA Maternal Grandmother    CVA Paternal Grandmother    Hepatitis C Son    Arthritis Son    Colon cancer Neg Hx    Colon polyps Neg Hx    Kidney disease Neg Hx    Esophageal cancer Neg Hx    Gallbladder disease Neg Hx    Diabetes Neg Hx     Objective: Office vital signs reviewed. BP 131/70   Pulse 81   Temp 98.2 F (36.8 C)   Ht 6\' 1"  (1.854 m)   Wt 168 lb 6.4 oz (76.4 kg)   SpO2 96%   BMI 22.22 kg/m   Physical Examination:  General: Awake, alert, well nourished, No acute distress HEENT: sclera white, no conjunctival pallor MSK: Hunched station but ambulating independently with normal gait Skin: dry; intact; no rashes or lesions Neuro: Cranial nerves II through XII grossly intact.  No focal neurologic deficits.  Negative Romberg  Assessment/ Plan: 89 y.o. male   Supratherapeutic INR  Chronic atrial fibrillation (HCC) - Plan: CoaguChek XS/INR Waived  Chronic anticoagulation - Plan: CoaguChek XS/INR Waived  Dizziness  INR supratherapeutic at 3.6 today.  Hold today, Saturday and Sundays dosing and then resume normal dosing thereafter.  I question orthostasis given reports of transient nature.  His neurologic exam was unremarkable today but low threshold to obtain MRI of the brain and refer to neurology if symptoms are ongoing.  His orthostatic vital signs were unremarkable surprisingly.  I am going to have him cut back on the doxazosin to just 1/2  tablet at bedtime for the next 2 weeks and I will see him back in 2 weeks for INR recheck.  Push oral fluids.  Follow-up sooner if concerns arise  No orders of the defined types were placed in this encounter.  No orders of the defined types were placed in this encounter.    Raliegh Ip, DO [facility] [phone]

## 2023-07-12 ENCOUNTER — Telehealth: Payer: Self-pay | Admitting: Family Medicine

## 2023-07-12 ENCOUNTER — Ambulatory Visit: Payer: Medicare Other | Admitting: Family Medicine

## 2023-07-12 ENCOUNTER — Encounter: Payer: Self-pay | Admitting: Family Medicine

## 2023-07-12 VITALS — BP 132/78 | HR 79 | Temp 98.3°F | Ht 73.0 in | Wt 167.8 lb

## 2023-07-12 DIAGNOSIS — Z7901 Long term (current) use of anticoagulants: Secondary | ICD-10-CM | POA: Diagnosis not present

## 2023-07-12 DIAGNOSIS — I482 Chronic atrial fibrillation, unspecified: Secondary | ICD-10-CM | POA: Diagnosis not present

## 2023-07-12 DIAGNOSIS — G9389 Other specified disorders of brain: Secondary | ICD-10-CM | POA: Diagnosis not present

## 2023-07-12 DIAGNOSIS — H814 Vertigo of central origin: Secondary | ICD-10-CM

## 2023-07-12 DIAGNOSIS — R200 Anesthesia of skin: Secondary | ICD-10-CM | POA: Diagnosis not present

## 2023-07-12 DIAGNOSIS — H539 Unspecified visual disturbance: Secondary | ICD-10-CM

## 2023-07-12 DIAGNOSIS — R791 Abnormal coagulation profile: Secondary | ICD-10-CM | POA: Diagnosis not present

## 2023-07-12 DIAGNOSIS — R42 Dizziness and giddiness: Secondary | ICD-10-CM

## 2023-07-12 LAB — COAGUCHEK XS/INR WAIVED
INR: 2.2 — ABNORMAL HIGH (ref 0.9–1.1)
Prothrombin Time: 26.2 s

## 2023-07-12 NOTE — Telephone Encounter (Signed)
Patient aware.

## 2023-07-12 NOTE — Telephone Encounter (Signed)
Pt called stating that he forgot to mention to Dr Nadine Counts about the wires he has from having open heart surgery in 1999. Wants to make sure that wont interfere with MRI that she is ordering for him.

## 2023-07-12 NOTE — Progress Notes (Signed)
Subjective: CC: INR checkup PCP: Raliegh Ip, DO HPI:Dalton Vaughan is a 89 y.o. male presenting to clinic today for:  1.  Chronic A-fib Goal INR 2-3 He had a supratherapeutic INR at 3.6 and was advised to hold normal dosing for 3 days and then resume normal dosing thereafter.  He does not report any bleeding  2.  Vertigo Continues to have vertiginous symptoms.  These are intermittent.  We reduced his doxazosin to just half tablet at bedtime to see if perhaps this would improve symptoms as there is concern for possible orthostasis.  He notes really no improvement since our last visit in fact now he reports that he has had some intermittent right-sided visual disturbance as well as lip numbness on the lower lip.  He continues to have some intermittent right-sided neck pain with vertiginous episodes.  He reports no falls or loss of vision.  No difficulty with speech or swallowing   ROS: Per HPI  Allergies  Allergen Reactions   Paxil [Paroxetine Hcl] Palpitations   Codeine Other (See Comments)   Eliquis [Apixaban] Other (See Comments)    dizziness   Penicillins Other (See Comments)    Did it involve swelling of the face/tongue/throat, SOB, or low BP? Unknown Did it involve sudden or severe rash/hives, skin peeling, or any reaction on the inside of your mouth or nose? Unknown Did you need to seek medical attention at a hospital or doctor's office? Unknown When did it last happen?      unk If all above answers are "NO", may proceed with cephalosporin use.    Pravachol [Pravastatin Sodium] Other (See Comments)    myalgias   Zetia [Ezetimibe] Other (See Comments)    myalgias   Vytorin [Ezetimibe-Simvastatin] Other (See Comments)    myalgias   Past Surgical History:  Procedure Laterality Date   bilateral cataract surgery  6/07   CARDIAC CATHETERIZATION     CORONARY ARTERY BYPASS GRAFT  12/99    Dr. Tyrone Sage    cysloscopy  8/08   EYE SURGERY     HERNIA REPAIR  1997   right    herninated disc  1995   PACEMAKER INSERTION  11/03/2020   PROSTATE BIOPSY  8/08   TONSILLECTOMY AND ADENOIDECTOMY      Past Medical History:  Diagnosis Date   Anal fissure    Arthritis    Atrial fibrillation (HCC)    BPH (benign prostatic hypertrophy)    CAD (coronary artery disease)    Dr. Donnie Aho   Cataract    COVID-19    Depression 2004   Diverticulosis    GERD (gastroesophageal reflux disease)    History of TIAs    Hyperlipidemia    Hypertension    Hypertensive heart disease without CHF    Hypothyroidism    Internal hemorrhoids    Lumbar disc disease    Oral bleeding 08/20/2012   Following molar tooth extraction July, 2013   Pneumonia    Ruptured disk 1995   Shingles    Thyroid disease    Vitamin D deficiency    Vitreous detachment     Current Outpatient Medications:    albuterol (VENTOLIN HFA) 108 (90 Base) MCG/ACT inhaler, Inhale 2 puffs into the lungs every 4 (four) hours as needed., Disp: , Rfl:    amLODipine (NORVASC) 5 MG tablet, TAKE 1 TABLET DAILY, Disp: 90 tablet, Rfl: 0   azelastine (ASTELIN) 0.1 % nasal spray, Place 1 spray into both nostrils 2 (two) times daily.,  Disp: 90 mL, Rfl: 4   benzonatate (TESSALON PERLES) 100 MG capsule, Take 1 capsule (100 mg total) by mouth 3 (three) times daily as needed for cough., Disp: 20 capsule, Rfl: 0   Calcium Carb-Cholecalciferol (CALCIUM + D3) 600-200 MG-UNIT TABS, Take 1 tablet by mouth daily. , Disp: , Rfl:    cetirizine (ZYRTEC ALLERGY) 10 MG tablet, Take 0.5 tablets (5 mg total) by mouth at bedtime as needed for allergies (drainage)., Disp: 90 tablet, Rfl: 3   Cholecalciferol (VITAMIN D-3) 5000 UNITS TABS, Take 1 tablet by mouth daily., Disp: , Rfl:    doxazosin (CARDURA) 8 MG tablet, Take 1 tablet (8 mg total) by mouth at bedtime., Disp: 90 tablet, Rfl: 3   furosemide (LASIX) 40 MG tablet, Take by mouth., Disp: , Rfl:    levothyroxine (SYNTHROID) 50 MCG tablet, TAKE 1 TABLET DAILY BEFORE BREAKFAST, Disp: 90  tablet, Rfl: 2   lidocaine (XYLOCAINE) 5 % ointment, Apply 1 application topically as needed., Disp: 35.44 g, Rfl: 0   linaclotide (LINZESS) 145 MCG CAPS capsule, Take 1 capsule (145 mcg total) by mouth daily before breakfast., Disp: 8 capsule, Rfl: 0   meclizine (ANTIVERT) 25 MG tablet, Take 25 mg by mouth daily as needed for dizziness., Disp: , Rfl:    mirabegron ER (MYRBETRIQ) 25 MG TB24 tablet, Take 1 tablet (25 mg total) by mouth daily., Disp: 45 tablet, Rfl: 0   mirtazapine (REMERON) 15 MG tablet, TAKE 1 TABLET AT BEDTIME, Disp: 90 tablet, Rfl: 0   Multiple Vitamin (MULTIVITAMIN) tablet, Take 1 tablet by mouth daily., Disp: , Rfl:    ondansetron (ZOFRAN-ODT) 4 MG disintegrating tablet, Take 1 tablet (4 mg total) by mouth every 8 (eight) hours as needed for nausea or vomiting., Disp: 20 tablet, Rfl: 0   ramipril (ALTACE) 10 MG capsule, TAKE 1 CAPSULE DAILY, Disp: 90 capsule, Rfl: 0   rosuvastatin (CRESTOR) 5 MG tablet, Take 1 tablet by mouth Mondays, Wednesdays and Fridays, Disp: 36 tablet, Rfl: 3   SIMETHICONE PO, Take by mouth as needed., Disp: , Rfl:    triamterene-hydrochlorothiazide (MAXZIDE-25) 37.5-25 MG tablet, Take 1 tablet by mouth daily., Disp: 90 tablet, Rfl: 3   vitamin C (ASCORBIC ACID) 500 MG tablet, Take 500 mg by mouth daily. , Disp: , Rfl:    warfarin (COUMADIN) 5 MG tablet, TAKE 1 TABLET DAILY AS DIRECTED, Disp: 90 tablet, Rfl: 3   XIIDRA 5 % SOLN, Apply 1 drop to eye as needed. , Disp: , Rfl:    triamterene-hydrochlorothiazide (MAXZIDE-25) 37.5-25 MG tablet, Take 1 tablet by mouth daily. [facility]. Waiting on mail order, Disp: 30 tablet, Rfl: 0 Social History   Socioeconomic History   Marital status: Widowed    Spouse name: Harriett Sine    Number of children: 1   Years of education: 12   Highest education level: Not on file  Occupational History   Occupation: Retired    Veterinary surgeon: Company secretary x 27 years   Occupation: Retired    Associate Professor: Public librarian    Comment:  supervisor x 17 years  Tobacco Use   Smoking status: Former    Current packs/day: 0.00    Average packs/day: 1 pack/day for 18.0 years (18.0 ttl pk-yrs)    Types: Cigarettes    Start date: 12/17/1948    Quit date: 12/17/1966    Years since quitting: 56.6   Smokeless tobacco: Never  Vaping Use   Vaping status: Never Used  Substance and Sexual Activity  Alcohol use: Yes    Alcohol/week: 1.0 standard drink of alcohol    Types: 1 Standard drinks or equivalent per week    Comment: rare   Drug use: No   Sexual activity: Not on file  Other Topics Concern   Not on file  Social History Narrative   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[employer]      Right-handed      Caffeine use: none      Son and grandson live with him   Social Determinants of Health   Financial Resource Strain: Low Risk  (05/06/2023)   Received from [facility], [facility]   Overall Financial Resource Strain (CARDIA)    Difficulty of Paying Living Expenses: Not very hard  Food Insecurity: No Food Insecurity (06/24/2023)   Hunger Vital Sign    Worried About Running Out of Food in the Last Year: Never true    Ran Out of Food in the Last Year: Never true  Transportation Needs: No Transportation Needs (06/24/2023)   PRAPARE - Administrator, Civil Service (Medical): No    Lack of Transportation (Non-Medical): No  Physical Activity: Sufficiently Active (03/05/2023)   Exercise Vital Sign    Days of Exercise per Week: 5 days    Minutes of Exercise per Session: 60 min  Stress: No Stress Concern Present (03/05/2023)   [employer] of Occupational Health - Occupational Stress Questionnaire    Feeling of Stress : Not at all  Social Connections: Socially Isolated (03/05/2023)   Social Connection and Isolation Panel [NHANES]    Frequency of Communication with Friends and Family: More than three times a week    Frequency of Social Gatherings with Friends and Family: More than three  times a week    Attends Religious Services: Never    Database administrator or Organizations: No    Attends Banker Meetings: Never    Marital Status: Widowed  Intimate Partner Violence: Not At Risk (06/24/2023)   Humiliation, Afraid, Rape, and Kick questionnaire    Fear of Current or Ex-Partner: No    Emotionally Abused: No    Physically Abused: No    Sexually Abused: No   Family History  Problem Relation Age of Onset   Dementia Mother    Arthritis Mother    Cancer Brother        prostate   Asthma Sister    Heart disease Paternal Aunt    Heart disease Paternal Uncle    Hypertension Maternal Grandmother    CVA Maternal Grandmother    CVA Paternal Grandmother    Hepatitis C Son    Arthritis Son    Colon cancer Neg Hx    Colon polyps Neg Hx    Kidney disease Neg Hx    Esophageal cancer Neg Hx    Gallbladder disease Neg Hx    Diabetes Neg Hx     Objective: Office vital signs reviewed. BP 132/78   Pulse 79   Temp 98.3 F (36.8 C)   Ht 6\' 1"  (1.854 m)   Wt 167 lb 12.8 oz (76.1 kg)   SpO2 96%   BMI 22.14 kg/m   Physical Examination:  General: Awake, alert, well nourished, No acute distress HEENT: Sclera white. Cardio: Regular rhythm.  Regular rate, S1S2 heard, no murmurs appreciated Pulm: clear to auscultation bilaterally, no wheezes, rhonchi or rales; normal work of breathing on room air Neuro: Cranial nerves II through XII grossly intact.  Negative Romberg.    Assessment/ Plan: 89 y.o. male   Anticoagulation monitoring, INR range 2-3 - Plan: CoaguChek XS/INR Waived  Chronic atrial fibrillation (HCC)  Vertigo - Plan: MR Angiogram Neck W Wo Contrast  Visual disturbances - Plan: MR Angiogram Neck W Wo Contrast  Lip numbness - Plan: MR Angiogram Neck W Wo Contrast  Vertigo of central origin - Plan: MR Angiogram Neck W Wo Contrast  Other specified disorders of brain - Plan: MR Angiogram Neck W Wo Contrast  INR at goal of 2.2 today.  No changes  to current regimen.  I am concerned now that he is reporting some neurologic manifestations during time of vertigo spells.  For this reason I am going to try and get MRI, MRA.  He has Medtronic pacemaker in place and this is MRI conditional.  I will obtain CTA if for what ever reason MRI, MRA cannot be obtained due to this device.  Concerned about potential vertebrobasilar artery insufficiency  Orders Placed This Encounter  Procedures   CoaguChek XS/INR Waived   No orders of the defined types were placed in this encounter.    Raliegh Ip, DO [facility] [phone]

## 2023-07-12 NOTE — Telephone Encounter (Signed)
Left vm for cb

## 2023-07-12 NOTE — Telephone Encounter (Signed)
Patient return call. ?

## 2023-07-12 NOTE — Telephone Encounter (Signed)
Got it.  I've messaged his cardiologist to inquire.  I looked at his last CT chest and see no mention of wires.  Waiting on response back from cardiology.  Referrals also aware.

## 2023-07-17 ENCOUNTER — Inpatient Hospital Stay: Payer: Medicare Other | Admitting: Oncology

## 2023-07-17 VITALS — BP 133/79 | HR 73 | Temp 97.8°F | Resp 16 | Wt 167.8 lb

## 2023-07-17 DIAGNOSIS — Z7901 Long term (current) use of anticoagulants: Secondary | ICD-10-CM | POA: Diagnosis not present

## 2023-07-17 DIAGNOSIS — D696 Thrombocytopenia, unspecified: Secondary | ICD-10-CM

## 2023-07-17 DIAGNOSIS — I495 Sick sinus syndrome: Secondary | ICD-10-CM | POA: Diagnosis not present

## 2023-07-17 DIAGNOSIS — I482 Chronic atrial fibrillation, unspecified: Secondary | ICD-10-CM | POA: Diagnosis not present

## 2023-07-17 NOTE — Progress Notes (Signed)
[facility] [address] [place] [address]   Clinic Day:  07/17/2023  Referring physician: Raliegh Ip, DO  Patient Care Team: Raliegh Ip, DO as PCP - General (Family Medicine) Heloise Purpura, MD as Consulting Physician (Urology) Pyrtle, Carie Caddy, MD as Consulting Physician (Gastroenterology) Ernesto Rutherford, MD as Consulting Physician (Ophthalmology) Dyane Dustman Renford Dills., MD as Referring Physician (Cardiology)   ASSESSMENT & PLAN:   Assessment:  1.  Mild intermittent thrombocytopenia: - Patient seen for intermittent mild thrombocytopenia since 2013. - CBC on 06/04/2023 PLT-125.  Normal Hb and WBC.  B12 and folate normal. - Denies any easy bruising.  Denies any nosebleeds. - He is on Coumadin for 20 years for chronic A-fib.  He reportedly had dizzy spells with Eliquis and could not tolerate it when they tried to switch. - CTA PE (11/05/2021): Spleen size normal.  No B symptoms.  2.  Social/family history: - Lives with son and grandson at home.  Independent of ADLs and IADLs.  He served in the [employer] for 27 years and participated in 2 tours in [place].  Exposure to agent orange present.  Quit smoking in 1968.  Worked at [employer] after [employer]. - Half brother had multiple myeloma.  Maternal uncle had lung cancer.  Plan:  1.  Mild intermittent thrombocytopenia: - Platelet count has been intermittently low beginning in 2022 ranging from 122 to normal. -Lab work from 06/24/2023 shows no evidence of nutritional deficiencies with normal MMA and copper.  Ferritin, iron saturations and folate are all within normal limits.  CBC shows normal hemoglobin with a platelet count of 149.  No evidence of infection with nonreactive or normal hep B and hep C lab results.  No evidence of connective tissue disorder with negative ANA, rheumatoid factor and LDH.  No evidence of underlying bone marrow infiltrative process with unremarkable  immunofixation and protein electrophoresis.  No evidence of monoclonal protein is present. -Etiology likely secondary to immune mediated thrombocytopenia.  -We discussed differential diagnosis of mild thrombocytopenia including immune mediated thrombocytopenia versus myelodysplastic syndrome.  Ideally one would need a bone marrow biopsy after age 89 to rule out component of MDS.  Since he has mild thrombocytopenia, may consider observation at this time.  If the platelet count drops < 100,000 consistently, would recommend bone marrow aspiration biopsy.   PLAN SUMMARY: >> Recommend follow-up in 6 months with lab work a couple days before.    I spent 25 minutes dedicated to the care of this patient (face-to-face and non-face-to-face) on the date of the encounter to include what is described in the assessment and plan.  No orders of the defined types were placed in this encounter.   Dalton Kaufmann, NP   7/31/202410:52 AM  CHIEF COMPLAINT/PURPOSE OF CONSULT:   Diagnosis: Mild thrombocytopenia   Cancer Staging  No matching staging information was found for the patient.    Prior Therapy: None  Current Therapy: Observation   HISTORY OF PRESENT ILLNESS:   Oncology History   No history exists.     Dalton Vaughan is a 89 y.o. male presenting to clinic today for follow-up for low platelet counts.  Reports overall feeling pretty good.  Has occasional dizzy spells and has intermittent fatigue.  Reports a in his for which she is undergoing workup.  Feels it may be related to a disc for which he will have an MRI in the near future.    Energy level is 75% and  appetite is 100%.  Today, he states that he is doing well overall. His appetite level is at 100%. His energy level is at 70%.  He denies any bleeding,, hematochezia, gum bleeding or epistasis.  He reports being on warfarin for 20 years due to AFIB. He notes he was offered a newer medication he believes was Eliquis 10-11 years ago, but went back  to Warfarin because Eliquis caused dizziness and vertigo.   PAST MEDICAL HISTORY:   Past Medical History: Past Medical History:  Diagnosis Date   Anal fissure    Arthritis    Atrial fibrillation (HCC)    BPH (benign prostatic hypertrophy)    CAD (coronary artery disease)    Dr. Donnie Aho   Cataract    COVID-19    Depression 2004   Diverticulosis    GERD (gastroesophageal reflux disease)    History of TIAs    Hyperlipidemia    Hypertension    Hypertensive heart disease without CHF    Hypothyroidism    Internal hemorrhoids    Lumbar disc disease    Oral bleeding 08/20/2012   Following molar tooth extraction July, 2013   Pneumonia    Ruptured disk 1995   Shingles    Thyroid disease    Vitamin D deficiency    Vitreous detachment     Surgical History: Past Surgical History:  Procedure Laterality Date   bilateral cataract surgery  6/07   CARDIAC CATHETERIZATION     CORONARY ARTERY BYPASS GRAFT  12/99    Dr. Tyrone Sage    cysloscopy  8/08   EYE SURGERY     HERNIA REPAIR  1997   right   herninated disc  1995   PACEMAKER INSERTION  11/03/2020   PROSTATE BIOPSY  8/08   TONSILLECTOMY AND ADENOIDECTOMY      Social History: Social History   Socioeconomic History   Marital status: Widowed    Spouse name: Harriett Sine    Number of children: 1   Years of education: 12   Highest education level: Not on file  Occupational History   Occupation: Retired    Veterinary surgeon: Company secretary x 27 years   Occupation: Retired    Associate Professor: Public librarian    Comment: supervisor x 17 years  Tobacco Use   Smoking status: Former    Current packs/day: 0.00    Average packs/day: 1 pack/day for 18.0 years (18.0 ttl pk-yrs)    Types: Cigarettes    Start date: 12/17/1948    Quit date: 12/17/1966    Years since quitting: 56.6   Smokeless tobacco: Never  Vaping Use   Vaping status: Never Used  Substance and Sexual Activity   Alcohol use: Yes    Alcohol/week: 1.0 standard drink of alcohol    Types: 1  Standard drinks or equivalent per week    Comment: rare   Drug use: No   Sexual activity: Not on file  Other Topics Concern   Not on file  Social History Narrative   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[employer]      Right-handed      Caffeine use: none      Son and grandson live with him   Social Determinants of Health   Financial Resource Strain: Low Risk  (05/06/2023)   Received from [facility], [facility]   Overall Financial Resource Strain (CARDIA)    Difficulty of Paying Living Expenses: Not very hard  Food Insecurity: No Food Insecurity (06/24/2023)  Hunger Vital Sign    Worried About Running Out of Food in the Last Year: Never true    Ran Out of Food in the Last Year: Never true  Transportation Needs: No Transportation Needs (06/24/2023)   PRAPARE - Administrator, Civil Service (Medical): No    Lack of Transportation (Non-Medical): No  Physical Activity: Sufficiently Active (03/05/2023)   Exercise Vital Sign    Days of Exercise per Week: 5 days    Minutes of Exercise per Session: 60 min  Stress: No Stress Concern Present (03/05/2023)   [employer] of Occupational Health - Occupational Stress Questionnaire    Feeling of Stress : Not at all  Social Connections: Socially Isolated (03/05/2023)   Social Connection and Isolation Panel [NHANES]    Frequency of Communication with Friends and Family: More than three times a week    Frequency of Social Gatherings with Friends and Family: More than three times a week    Attends Religious Services: Never    Database administrator or Organizations: No    Attends Banker Meetings: Never    Marital Status: Widowed  Intimate Partner Violence: Not At Risk (06/24/2023)   Humiliation, Afraid, Rape, and Kick questionnaire    Fear of Current or Ex-Partner: No    Emotionally Abused: No    Physically Abused: No    Sexually Abused: No    Family History: Family History  Problem  Relation Age of Onset   Dementia Mother    Arthritis Mother    Cancer Brother        prostate   Asthma Sister    Heart disease Paternal Aunt    Heart disease Paternal Uncle    Hypertension Maternal Grandmother    CVA Maternal Grandmother    CVA Paternal Grandmother    Hepatitis C Son    Arthritis Son    Colon cancer Neg Hx    Colon polyps Neg Hx    Kidney disease Neg Hx    Esophageal cancer Neg Hx    Gallbladder disease Neg Hx    Diabetes Neg Hx     Current Medications:  Current Outpatient Medications:    amLODipine (NORVASC) 5 MG tablet, TAKE 1 TABLET DAILY, Disp: 90 tablet, Rfl: 0   azelastine (ASTELIN) 0.1 % nasal spray, Place 1 spray into both nostrils 2 (two) times daily., Disp: 90 mL, Rfl: 4   Calcium Carb-Cholecalciferol (CALCIUM + D3) 600-200 MG-UNIT TABS, Take 1 tablet by mouth daily. , Disp: , Rfl:    Cholecalciferol (VITAMIN D-3) 5000 UNITS TABS, Take 1 tablet by mouth daily., Disp: , Rfl:    doxazosin (CARDURA) 8 MG tablet, Take 1 tablet (8 mg total) by mouth at bedtime., Disp: 90 tablet, Rfl: 3   levothyroxine (SYNTHROID) 50 MCG tablet, TAKE 1 TABLET DAILY BEFORE BREAKFAST, Disp: 90 tablet, Rfl: 2   mirtazapine (REMERON) 15 MG tablet, TAKE 1 TABLET AT BEDTIME, Disp: 90 tablet, Rfl: 0   Multiple Vitamin (MULTIVITAMIN) tablet, Take 1 tablet by mouth daily., Disp: , Rfl:    ramipril (ALTACE) 10 MG capsule, TAKE 1 CAPSULE DAILY, Disp: 90 capsule, Rfl: 0   rosuvastatin (CRESTOR) 5 MG tablet, Take 1 tablet by mouth Mondays, Wednesdays and Fridays, Disp: 36 tablet, Rfl: 3   triamterene-hydrochlorothiazide (MAXZIDE-25) 37.5-25 MG tablet, Take 1 tablet by mouth daily., Disp: 90 tablet, Rfl: 3   vitamin C (ASCORBIC ACID) 500 MG tablet, Take 500 mg by mouth daily. , Disp: , Rfl:  warfarin (COUMADIN) 5 MG tablet, TAKE 1 TABLET DAILY AS DIRECTED, Disp: 90 tablet, Rfl: 3   meclizine (ANTIVERT) 25 MG tablet, Take 25 mg by mouth daily as needed for dizziness. (Patient not taking:  Reported on 07/17/2023), Disp: , Rfl:    SIMETHICONE PO, Take by mouth as needed. (Patient not taking: Reported on 07/17/2023), Disp: , Rfl:    Allergies: Allergies  Allergen Reactions   Paxil [Paroxetine Hcl] Palpitations   Codeine Other (See Comments)   Eliquis [Apixaban] Other (See Comments)    dizziness   Penicillins Other (See Comments)    Did it involve swelling of the face/tongue/throat, SOB, or low BP? Unknown Did it involve sudden or severe rash/hives, skin peeling, or any reaction on the inside of your mouth or nose? Unknown Did you need to seek medical attention at a hospital or doctor's office? Unknown When did it last happen?      unk If all above answers are "NO", may proceed with cephalosporin use.    Pravachol [Pravastatin Sodium] Other (See Comments)    myalgias   Zetia [Ezetimibe] Other (See Comments)    myalgias   Vytorin [Ezetimibe-Simvastatin] Other (See Comments)    myalgias    REVIEW OF SYSTEMS:   Review of Systems  Constitutional:  Positive for fatigue.  Neurological:  Positive for dizziness.     VITALS:   Blood pressure 133/79, pulse 73, temperature 97.8 F (36.6 C), temperature source Oral, resp. rate 16, weight 167 lb 12.3 oz (76.1 kg), SpO2 97%.  Wt Readings from Last 3 Encounters:  07/17/23 167 lb 12.3 oz (76.1 kg)  07/12/23 167 lb 12.8 oz (76.1 kg)  06/28/23 168 lb 6.4 oz (76.4 kg)    Body mass index is 22.13 kg/m.  Performance status (ECOG): 1 - Symptomatic but completely ambulatory  PHYSICAL EXAM:   Physical Exam Constitutional:      Appearance: Normal appearance.  Cardiovascular:     Rate and Rhythm: Normal rate and regular rhythm.  Pulmonary:     Effort: Pulmonary effort is normal.     Breath sounds: Normal breath sounds.  Abdominal:     General: Bowel sounds are normal.     Palpations: Abdomen is soft.  Musculoskeletal:        General: No swelling. Normal range of motion.  Neurological:     Mental Status: He is alert and  oriented to person, place, and time. Mental status is at baseline.     LABS:      Latest Ref Rng & Units 06/24/2023   12:02 PM 06/04/2023   11:51 AM 02/12/2023   10:00 AM  CBC  WBC 4.0 - 10.5 K/uL 6.2  5.4  6.8   Hemoglobin 13.0 - 17.0 g/dL 84.6  96.2  95.2   Hematocrit 39.0 - 52.0 % 43.4  40.9  43.1   Platelets 150 - 400 K/uL 149  125  182       Latest Ref Rng & Units 01/01/2023   10:10 AM 12/20/2021   10:00 AM 11/08/2021   10:27 AM  CMP  Glucose 70 - 99 mg/dL 79  86  90   BUN 10 - 36 mg/dL 12  13  8    Creatinine 0.76 - 1.27 mg/dL 8.41  3.24  4.01   Sodium 134 - 144 mmol/L 141  139  136   Potassium 3.5 - 5.2 mmol/L 4.0  3.5  3.5   Chloride 96 - 106 mmol/L 101  100  99  CO2 20 - 29 mmol/L 27  28  31    Calcium 8.6 - 10.2 mg/dL 9.5  9.5  9.4   Total Protein 6.0 - 8.5 g/dL 6.5  6.4    Total Bilirubin 0.0 - 1.2 mg/dL 0.5  0.7    Alkaline Phos 44 - 121 IU/L 125  137    AST 0 - 40 IU/L 17  30    ALT 0 - 44 IU/L 13  25       No results found for: "CEA1", "CEA" / No results found for: "CEA1", "CEA" Lab Results  Component Value Date   PSA1 32.4 (H) 02/22/2021   No results found for: "WUJ811" No results found for: "CAN125"  Lab Results  Component Value Date   TOTALPROTELP 7.4 06/24/2023   TOTALPROTELP 6.6 06/24/2023   ALBUMINELP 4.3 06/24/2023   A1GS 0.3 06/24/2023   A2GS 0.7 06/24/2023   BETS 1.1 06/24/2023   GAMS 1.0 06/24/2023   MSPIKE Not Observed 06/24/2023   SPEI Comment 06/24/2023   Lab Results  Component Value Date   TIBC 302 06/04/2023   FERRITIN 136 06/04/2023   FERRITIN 726 (H) 09/16/2019   FERRITIN 855 (H) 09/15/2019   IRONPCTSAT 30 06/04/2023   Lab Results  Component Value Date   LDH 129 06/24/2023     STUDIES:   No results found.

## 2023-08-01 DIAGNOSIS — M79676 Pain in unspecified toe(s): Secondary | ICD-10-CM | POA: Diagnosis not present

## 2023-08-01 DIAGNOSIS — L84 Corns and callosities: Secondary | ICD-10-CM | POA: Diagnosis not present

## 2023-08-01 DIAGNOSIS — E1142 Type 2 diabetes mellitus with diabetic polyneuropathy: Secondary | ICD-10-CM | POA: Diagnosis not present

## 2023-08-01 DIAGNOSIS — B351 Tinea unguium: Secondary | ICD-10-CM | POA: Diagnosis not present

## 2023-08-01 DIAGNOSIS — L603 Nail dystrophy: Secondary | ICD-10-CM | POA: Diagnosis not present

## 2023-08-22 ENCOUNTER — Other Ambulatory Visit: Payer: Self-pay | Admitting: Family Medicine

## 2023-08-22 DIAGNOSIS — I700 Atherosclerosis of aorta: Secondary | ICD-10-CM

## 2023-09-10 ENCOUNTER — Ambulatory Visit (INDEPENDENT_AMBULATORY_CARE_PROVIDER_SITE_OTHER): Payer: Medicare Other | Admitting: Family Medicine

## 2023-09-10 ENCOUNTER — Encounter: Payer: Self-pay | Admitting: Family Medicine

## 2023-09-10 VITALS — BP 109/61 | HR 77 | Temp 98.7°F | Ht 73.0 in | Wt 167.0 lb

## 2023-09-10 DIAGNOSIS — I10 Essential (primary) hypertension: Secondary | ICD-10-CM | POA: Diagnosis not present

## 2023-09-10 DIAGNOSIS — F321 Major depressive disorder, single episode, moderate: Secondary | ICD-10-CM

## 2023-09-10 DIAGNOSIS — R42 Dizziness and giddiness: Secondary | ICD-10-CM | POA: Diagnosis not present

## 2023-09-10 DIAGNOSIS — Z7901 Long term (current) use of anticoagulants: Secondary | ICD-10-CM

## 2023-09-10 DIAGNOSIS — R791 Abnormal coagulation profile: Secondary | ICD-10-CM

## 2023-09-10 DIAGNOSIS — Z23 Encounter for immunization: Secondary | ICD-10-CM

## 2023-09-10 DIAGNOSIS — I482 Chronic atrial fibrillation, unspecified: Secondary | ICD-10-CM | POA: Diagnosis not present

## 2023-09-10 LAB — COAGUCHEK XS/INR WAIVED
INR: 3.1 — ABNORMAL HIGH (ref 0.9–1.1)
Prothrombin Time: 37.4 s

## 2023-09-10 MED ORDER — MIRTAZAPINE 15 MG PO TABS: 15.0000 mg | ORAL_TABLET | Freq: Every day | ORAL | 3 refills | Status: DC

## 2023-09-10 MED ORDER — AMLODIPINE BESYLATE 5 MG PO TABS: 5.0000 mg | ORAL_TABLET | Freq: Every day | ORAL | 3 refills | Status: DC

## 2023-09-10 NOTE — Progress Notes (Signed)
Subjective: CC: INR check PCP: Raliegh Ip, DO HPI:Dalton Vaughan is a 89 y.o. male presenting to clinic today for:  1.  INR check Goal 2-3 Patient compliant with 1/2 tablet of 5 mg of Coumadin Mondays and Fridays and 1 tablet daily all others.  Denies any bleeding.  Continues to have dizziness.  Has MRI scheduled for Friday.  No falls.  Asking for handicap placard.   ROS: Per HPI  Allergies  Allergen Reactions   Paxil [Paroxetine Hcl] Palpitations   Codeine Other (See Comments)   Eliquis [Apixaban] Other (See Comments)    dizziness   Penicillins Other (See Comments)    Did it involve swelling of the face/tongue/throat, SOB, or low BP? Unknown Did it involve sudden or severe rash/hives, skin peeling, or any reaction on the inside of your mouth or nose? Unknown Did you need to seek medical attention at a hospital or doctor's office? Unknown When did it last happen?      unk If all above answers are "NO", may proceed with cephalosporin use.    Pravachol [Pravastatin Sodium] Other (See Comments)    myalgias   Zetia [Ezetimibe] Other (See Comments)    myalgias   Vytorin [Ezetimibe-Simvastatin] Other (See Comments)    myalgias   Past Medical History:  Diagnosis Date   Anal fissure    Arthritis    Atrial fibrillation (HCC)    BPH (benign prostatic hypertrophy)    CAD (coronary artery disease)    Dr. Donnie Aho   Cataract    COVID-19    Depression 2004   Diverticulosis    GERD (gastroesophageal reflux disease)    History of TIAs    Hyperlipidemia    Hypertension    Hypertensive heart disease without CHF    Hypothyroidism    Internal hemorrhoids    Lumbar disc disease    Oral bleeding 08/20/2012   Following molar tooth extraction July, 2013   Pneumonia    Ruptured disk 1995   Shingles    Thyroid disease    Vitamin D deficiency    Vitreous detachment     Current Outpatient Medications:    amLODipine (NORVASC) 5 MG tablet, TAKE 1 TABLET DAILY, Disp: 90 tablet,  Rfl: 0   azelastine (ASTELIN) 0.1 % nasal spray, Place 1 spray into both nostrils 2 (two) times daily., Disp: 90 mL, Rfl: 4   Calcium Carb-Cholecalciferol (CALCIUM + D3) 600-200 MG-UNIT TABS, Take 1 tablet by mouth daily. , Disp: , Rfl:    Cholecalciferol (VITAMIN D-3) 5000 UNITS TABS, Take 1 tablet by mouth daily., Disp: , Rfl:    doxazosin (CARDURA) 8 MG tablet, Take 1 tablet (8 mg total) by mouth at bedtime., Disp: 90 tablet, Rfl: 3   levothyroxine (SYNTHROID) 50 MCG tablet, TAKE 1 TABLET DAILY BEFORE BREAKFAST, Disp: 90 tablet, Rfl: 2   meclizine (ANTIVERT) 25 MG tablet, Take 25 mg by mouth daily as needed for dizziness. (Patient not taking: Reported on 07/17/2023), Disp: , Rfl:    mirtazapine (REMERON) 15 MG tablet, TAKE 1 TABLET AT BEDTIME, Disp: 90 tablet, Rfl: 0   Multiple Vitamin (MULTIVITAMIN) tablet, Take 1 tablet by mouth daily., Disp: , Rfl:    ramipril (ALTACE) 10 MG capsule, TAKE 1 CAPSULE DAILY, Disp: 90 capsule, Rfl: 0   rosuvastatin (CRESTOR) 5 MG tablet, Take 1 tablet by mouth Mondays, Wednesdays and Fridays, Disp: 36 tablet, Rfl: 3   SIMETHICONE PO, Take by mouth as needed. (Patient not taking: Reported on 07/17/2023), Disp: ,  Rfl:    triamterene-hydrochlorothiazide (MAXZIDE-25) 37.5-25 MG tablet, Take 1 tablet by mouth daily., Disp: 90 tablet, Rfl: 3   vitamin C (ASCORBIC ACID) 500 MG tablet, Take 500 mg by mouth daily. , Disp: , Rfl:    warfarin (COUMADIN) 5 MG tablet, TAKE 1 TABLET DAILY AS DIRECTED, Disp: 90 tablet, Rfl: 3 Social History   Socioeconomic History   Marital status: Widowed    Spouse name: Harriett Sine    Number of children: 1   Years of education: 12   Highest education level: Not on file  Occupational History   Occupation: Retired    Veterinary surgeon: Company secretary x 27 years   Occupation: Retired    Associate Professor: Public librarian    Comment: supervisor x 17 years  Tobacco Use   Smoking status: Former    Current packs/day: 0.00    Average packs/day: 1 pack/day for 18.0  years (18.0 ttl pk-yrs)    Types: Cigarettes    Start date: 12/17/1948    Quit date: 12/17/1966    Years since quitting: 56.7   Smokeless tobacco: Never  Vaping Use   Vaping status: Never Used  Substance and Sexual Activity   Alcohol use: Yes    Alcohol/week: 1.0 standard drink of alcohol    Types: 1 Standard drinks or equivalent per week    Comment: rare   Drug use: No   Sexual activity: Not on file  Other Topics Concern   Not on file  Social History Narrative   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[employer]      Right-handed      Caffeine use: none      Son and grandson live with him   Social Determinants of Health   Financial Resource Strain: Low Risk  (05/06/2023)   Received from [facility], [facility]   Overall Financial Resource Strain (CARDIA)    Difficulty of Paying Living Expenses: Not very hard  Food Insecurity: No Food Insecurity (06/24/2023)   Hunger Vital Sign    Worried About Running Out of Food in the Last Year: Never true    Ran Out of Food in the Last Year: Never true  Transportation Needs: No Transportation Needs (06/24/2023)   PRAPARE - Administrator, Civil Service (Medical): No    Lack of Transportation (Non-Medical): No  Physical Activity: Sufficiently Active (03/05/2023)   Exercise Vital Sign    Days of Exercise per Week: 5 days    Minutes of Exercise per Session: 60 min  Stress: No Stress Concern Present (03/05/2023)   [employer] of Occupational Health - Occupational Stress Questionnaire    Feeling of Stress : Not at all  Social Connections: Socially Isolated (03/05/2023)   Social Connection and Isolation Panel [NHANES]    Frequency of Communication with Friends and Family: More than three times a week    Frequency of Social Gatherings with Friends and Family: More than three times a week    Attends Religious Services: Never    Database administrator or Organizations: No    Attends Banker  Meetings: Never    Marital Status: Widowed  Intimate Partner Violence: Not At Risk (06/24/2023)   Humiliation, Afraid, Rape, and Kick questionnaire    Fear of Current or Ex-Partner: No    Emotionally Abused: No    Physically Abused: No    Sexually Abused: No   Family History  Problem Relation Age of Onset   Dementia Mother  Arthritis Mother    Cancer Brother        prostate   Asthma Sister    Heart disease Paternal Aunt    Heart disease Paternal Uncle    Hypertension Maternal Grandmother    CVA Maternal Grandmother    CVA Paternal Grandmother    Hepatitis C Son    Arthritis Son    Colon cancer Neg Hx    Colon polyps Neg Hx    Kidney disease Neg Hx    Esophageal cancer Neg Hx    Gallbladder disease Neg Hx    Diabetes Neg Hx     Objective: Office vital signs reviewed. BP 109/61   Pulse 77   Temp 98.7 F (37.1 C)   Ht 6\' 1"  (1.854 m)   Wt 167 lb (75.8 kg)   SpO2 96%   BMI 22.03 kg/m   Physical Examination:  General: Awake, alert, well nourished, No acute distress HEENT: sclera white, MMM.  No nystagmus Cardio: regular rate and rhythm, S1S2 heard, no murmurs appreciated Pulm: clear to auscultation bilaterally, no wheezes, rhonchi or rales; normal work of breathing on room air MSK: Ambulating independently with apparently normal gait and station  Assessment/ Plan: 89 y.o. male   Supratherapeutic INR  Chronic atrial fibrillation (HCC) - Plan: CoaguChek XS/INR Waived  Anticoagulation monitoring, INR range 2-3 - Plan: CoaguChek XS/INR Waived  Vertigo  Primary hypertension - Plan: amLODipine (NORVASC) 5 MG tablet  Current moderate episode of major depressive disorder without prior episode (HCC) - Plan: mirtazapine (REMERON) 15 MG tablet  INR supratherapeutic at 3.1 today.  Reduce to half tablet 3 days/week and continue 1 tablet daily all others.  Will see him back in the next 2 to 3 weeks, sooner if concerns arise  A-fib was both rate and rhythm controlled  today.  Vertigo persistent but not as severe.  Proceed with MRA as directed.  Further interventions pending results   Raliegh Ip, DO [facility] [phone]

## 2023-09-13 ENCOUNTER — Ambulatory Visit (HOSPITAL_COMMUNITY)
Admission: RE | Admit: 2023-09-13 | Discharge: 2023-09-13 | Disposition: A | Discharge status: Home / Self Care | Payer: Medicare Other | Source: Ambulatory Visit | Attending: Family Medicine | Admitting: Family Medicine

## 2023-09-13 ENCOUNTER — Ambulatory Visit (HOSPITAL_COMMUNITY)
Admission: RE | Admit: 2023-09-13 | Discharge: 2023-09-13 | Disposition: A | Discharge status: Home / Self Care | Payer: Medicare Other | Source: Ambulatory Visit | Attending: Student

## 2023-09-13 DIAGNOSIS — R42 Dizziness and giddiness: Secondary | ICD-10-CM | POA: Insufficient documentation

## 2023-09-13 DIAGNOSIS — H814 Vertigo of central origin: Secondary | ICD-10-CM | POA: Diagnosis not present

## 2023-09-13 DIAGNOSIS — G9389 Other specified disorders of brain: Secondary | ICD-10-CM | POA: Diagnosis not present

## 2023-09-13 DIAGNOSIS — M542 Cervicalgia: Secondary | ICD-10-CM | POA: Diagnosis not present

## 2023-09-13 DIAGNOSIS — Z950 Presence of cardiac pacemaker: Secondary | ICD-10-CM | POA: Diagnosis not present

## 2023-09-13 DIAGNOSIS — H547 Unspecified visual loss: Secondary | ICD-10-CM | POA: Diagnosis not present

## 2023-09-13 DIAGNOSIS — H539 Unspecified visual disturbance: Secondary | ICD-10-CM | POA: Insufficient documentation

## 2023-09-13 DIAGNOSIS — R200 Anesthesia of skin: Secondary | ICD-10-CM | POA: Insufficient documentation

## 2023-09-13 MED ORDER — GADOBUTROL 1 MMOL/ML IV SOLN
7.0000 mL | Freq: Once | INTRAVENOUS | Status: AC | PRN
  Administered 2023-09-13: 7 mL via INTRAVENOUS

## 2023-09-27 ENCOUNTER — Ambulatory Visit: Payer: Medicare Other | Admitting: Family Medicine

## 2023-09-27 ENCOUNTER — Encounter: Payer: Self-pay | Admitting: Family Medicine

## 2023-09-27 VITALS — BP 139/81 | HR 82 | Temp 98.5°F | Ht 73.0 in | Wt 166.0 lb

## 2023-09-27 DIAGNOSIS — R42 Dizziness and giddiness: Secondary | ICD-10-CM

## 2023-09-27 DIAGNOSIS — I482 Chronic atrial fibrillation, unspecified: Secondary | ICD-10-CM | POA: Diagnosis not present

## 2023-09-27 DIAGNOSIS — M4726 Other spondylosis with radiculopathy, lumbar region: Secondary | ICD-10-CM

## 2023-09-27 LAB — COAGUCHEK XS/INR WAIVED
INR: 2.7 — ABNORMAL HIGH (ref 0.9–1.1)
Prothrombin Time: 32.3 s

## 2023-09-27 NOTE — Progress Notes (Addendum)
Subjective: CC: INR check PCP: Raliegh Ip, DO HPI:Dalton Vaughan is a 89 y.o. male presenting to clinic today for:  1.  INR check Patient here for interval follow-up on INR which was supratherapeutic last visit.  He is taking medications as prescribed.  No bleeding.  2.  Low back pain This has been an issue for him for a while.  He has had sciatic changes.  He was under the care of Dr. Venetia Maxon with neurosurgery down in [place].  Last OV was 2021.  He was having refractory symptoms to oral analgesics, injection therapy and so surgery was planned.  He continues to have difficulty with gait and ambulation due to back pain, particularly if he has been seated for a while.  Would like to see them again  3.  Dizziness Has gotten much better.  Still has some dizzy spells but not to the degree he was experiencing.  Had MRI back in September but still has not gotten the results yet   ROS: Per HPI  Allergies  Allergen Reactions   Paxil [Paroxetine Hcl] Palpitations   Codeine Other (See Comments)   Eliquis [Apixaban] Other (See Comments)    dizziness   Penicillins Other (See Comments)    Did it involve swelling of the face/tongue/throat, SOB, or low BP? Unknown Did it involve sudden or severe rash/hives, skin peeling, or any reaction on the inside of your mouth or nose? Unknown Did you need to seek medical attention at a hospital or doctor's office? Unknown When did it last happen?      unk If all above answers are "NO", may proceed with cephalosporin use.    Pravachol [Pravastatin Sodium] Other (See Comments)    myalgias   Zetia [Ezetimibe] Other (See Comments)    myalgias   Vytorin [Ezetimibe-Simvastatin] Other (See Comments)    myalgias   Past Medical History:  Diagnosis Date   Anal fissure    Arthritis    Atrial fibrillation (HCC)    BPH (benign prostatic hypertrophy)    CAD (coronary artery disease)    Dr. Donnie Aho   Cataract    COVID-19    Depression 2004    Diverticulosis    GERD (gastroesophageal reflux disease)    History of TIAs    Hyperlipidemia    Hypertension    Hypertensive heart disease without CHF    Hypothyroidism    Internal hemorrhoids    Lumbar disc disease    Oral bleeding 08/20/2012   Following molar tooth extraction July, 2013   Pneumonia    Ruptured disk 1995   Shingles    Thyroid disease    Vitamin D deficiency    Vitreous detachment     Current Outpatient Medications:    amLODipine (NORVASC) 5 MG tablet, Take 1 tablet (5 mg total) by mouth daily., Disp: 100 tablet, Rfl: 3   azelastine (ASTELIN) 0.1 % nasal spray, Place 1 spray into both nostrils 2 (two) times daily., Disp: 90 mL, Rfl: 4   Calcium Carb-Cholecalciferol (CALCIUM + D3) 600-200 MG-UNIT TABS, Take 1 tablet by mouth daily. , Disp: , Rfl:    Cholecalciferol (VITAMIN D-3) 5000 UNITS TABS, Take 1 tablet by mouth daily., Disp: , Rfl:    doxazosin (CARDURA) 8 MG tablet, Take 1 tablet (8 mg total) by mouth at bedtime., Disp: 90 tablet, Rfl: 3   levothyroxine (SYNTHROID) 50 MCG tablet, TAKE 1 TABLET DAILY BEFORE BREAKFAST, Disp: 90 tablet, Rfl: 2   meclizine (ANTIVERT) 25 MG tablet, Take  25 mg by mouth daily as needed for dizziness., Disp: , Rfl:    mirtazapine (REMERON) 15 MG tablet, Take 1 tablet (15 mg total) by mouth at bedtime., Disp: 100 tablet, Rfl: 3   Multiple Vitamin (MULTIVITAMIN) tablet, Take 1 tablet by mouth daily., Disp: , Rfl:    ramipril (ALTACE) 10 MG capsule, TAKE 1 CAPSULE DAILY, Disp: 90 capsule, Rfl: 0   rosuvastatin (CRESTOR) 5 MG tablet, Take 1 tablet by mouth Mondays, Wednesdays and Fridays, Disp: 36 tablet, Rfl: 3   SIMETHICONE PO, Take by mouth as needed., Disp: , Rfl:    triamterene-hydrochlorothiazide (MAXZIDE-25) 37.5-25 MG tablet, Take 1 tablet by mouth daily., Disp: 90 tablet, Rfl: 3   vitamin C (ASCORBIC ACID) 500 MG tablet, Take 500 mg by mouth daily. , Disp: , Rfl:    warfarin (COUMADIN) 5 MG tablet, TAKE 1 TABLET DAILY AS  DIRECTED, Disp: 90 tablet, Rfl: 3 Social History   Socioeconomic History   Marital status: Widowed    Spouse name: Dalton Vaughan    Number of children: 1   Years of education: 12   Highest education level: Not on file  Occupational History   Occupation: Retired    Veterinary surgeon: Company secretary x 27 years   Occupation: Retired    Associate Professor: Public librarian    Comment: supervisor x 17 years  Tobacco Use   Smoking status: Former    Current packs/day: 0.00    Average packs/day: 1 pack/day for 18.0 years (18.0 ttl pk-yrs)    Types: Cigarettes    Start date: 12/17/1948    Quit date: 12/17/1966    Years since quitting: 56.8   Smokeless tobacco: Never  Vaping Use   Vaping status: Never Used  Substance and Sexual Activity   Alcohol use: Yes    Alcohol/week: 1.0 standard drink of alcohol    Types: 1 Standard drinks or equivalent per week    Comment: rare   Drug use: No   Sexual activity: Not on file  Other Topics Concern   Not on file  Social History Narrative   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[employer]      Right-handed      Caffeine use: none      Son and grandson live with him   Social Determinants of Health   Financial Resource Strain: Low Risk  (05/06/2023)   Received from [facility], [facility]   Overall Financial Resource Strain (CARDIA)    Difficulty of Paying Living Expenses: Not very hard  Food Insecurity: No Food Insecurity (06/24/2023)   Hunger Vital Sign    Worried About Running Out of Food in the Last Year: Never true    Ran Out of Food in the Last Year: Never true  Transportation Needs: No Transportation Needs (06/24/2023)   PRAPARE - Administrator, Civil Service (Medical): No    Lack of Transportation (Non-Medical): No  Physical Activity: Sufficiently Active (03/05/2023)   Exercise Vital Sign    Days of Exercise per Week: 5 days    Minutes of Exercise per Session: 60 min  Stress: No Stress Concern Present (03/05/2023)   [employer] of Occupational Health - Occupational Stress Questionnaire    Feeling of Stress : Not at all  Social Connections: Socially Isolated (03/05/2023)   Social Connection and Isolation Panel [NHANES]    Frequency of Communication with Friends and Family: More than three times a week    Frequency of Social Gatherings with  Friends and Family: More than three times a week    Attends Religious Services: Never    Database administrator or Organizations: No    Attends Banker Meetings: Never    Marital Status: Widowed  Intimate Partner Violence: Not At Risk (06/24/2023)   Humiliation, Afraid, Rape, and Kick questionnaire    Fear of Current or Ex-Partner: No    Emotionally Abused: No    Physically Abused: No    Sexually Abused: No   Family History  Problem Relation Age of Onset   Dementia Mother    Arthritis Mother    Cancer Brother        prostate   Asthma Sister    Heart disease Paternal Aunt    Heart disease Paternal Uncle    Hypertension Maternal Grandmother    CVA Maternal Grandmother    CVA Paternal Grandmother    Hepatitis C Son    Arthritis Son    Colon cancer Neg Hx    Colon polyps Neg Hx    Kidney disease Neg Hx    Esophageal cancer Neg Hx    Gallbladder disease Neg Hx    Diabetes Neg Hx     Objective: Office vital signs reviewed. BP 139/81   Pulse 82   Temp 98.5 F (36.9 C)   Ht 6\' 1"  (1.854 m)   Wt 166 lb (75.3 kg)   SpO2 95%   BMI 21.90 kg/m   Physical Examination:  General: Awake, alert, well nourished, No acute distress HEENT: Sclera white.  Moist mucous membranes Cardio: regular rate and rhythm, S1S2 heard, no murmurs appreciated Pulm: clear to auscultation bilaterally, no wheezes, rhonchi or rales; normal work of breathing on room air MSK: Ambulating independently with hunched station  Assessment/ Plan: 89 y.o. male   Chronic atrial fibrillation (HCC) - Plan: CoaguChek XS/INR Waived  Dizziness  Osteoarthritis of spine with  radiculopathy, lumbar region - Plan: Ambulatory referral to Neurosurgery  INR therapeutic. No red flags  MRA still not read. Will contact with results.  Referral placed back to neurosurgery.  Will try and get him back into Dr. Fredrich Birks office who is now retired.  Sounds like he was set up for surgery pre-COVID but is been lost to follow-up since.  Symptoms have been refractory to gabapentin, injection therapy, oral analgesics etc.   Raliegh Ip, DO [facility] [phone]

## 2023-10-10 DIAGNOSIS — B351 Tinea unguium: Secondary | ICD-10-CM | POA: Diagnosis not present

## 2023-10-10 DIAGNOSIS — M79676 Pain in unspecified toe(s): Secondary | ICD-10-CM | POA: Diagnosis not present

## 2023-10-10 DIAGNOSIS — I70203 Unspecified atherosclerosis of native arteries of extremities, bilateral legs: Secondary | ICD-10-CM | POA: Diagnosis not present

## 2023-10-10 DIAGNOSIS — L84 Corns and callosities: Secondary | ICD-10-CM | POA: Diagnosis not present

## 2023-10-11 DIAGNOSIS — M542 Cervicalgia: Secondary | ICD-10-CM | POA: Diagnosis not present

## 2023-10-11 DIAGNOSIS — M5416 Radiculopathy, lumbar region: Secondary | ICD-10-CM | POA: Diagnosis not present

## 2023-10-16 DIAGNOSIS — I495 Sick sinus syndrome: Secondary | ICD-10-CM | POA: Diagnosis not present

## 2023-10-21 ENCOUNTER — Other Ambulatory Visit (HOSPITAL_COMMUNITY): Payer: Self-pay | Admitting: Surgery

## 2023-10-21 DIAGNOSIS — M542 Cervicalgia: Secondary | ICD-10-CM

## 2023-11-06 DIAGNOSIS — Z951 Presence of aortocoronary bypass graft: Secondary | ICD-10-CM | POA: Diagnosis not present

## 2023-11-06 DIAGNOSIS — I2510 Atherosclerotic heart disease of native coronary artery without angina pectoris: Secondary | ICD-10-CM | POA: Diagnosis not present

## 2023-11-06 DIAGNOSIS — Z950 Presence of cardiac pacemaker: Secondary | ICD-10-CM | POA: Diagnosis not present

## 2023-11-06 DIAGNOSIS — I495 Sick sinus syndrome: Secondary | ICD-10-CM | POA: Diagnosis not present

## 2023-11-06 DIAGNOSIS — I4891 Unspecified atrial fibrillation: Secondary | ICD-10-CM | POA: Diagnosis not present

## 2023-11-07 ENCOUNTER — Other Ambulatory Visit: Payer: Self-pay | Admitting: Family Medicine

## 2023-11-07 DIAGNOSIS — I700 Atherosclerosis of aorta: Secondary | ICD-10-CM

## 2023-11-08 DIAGNOSIS — I4891 Unspecified atrial fibrillation: Secondary | ICD-10-CM | POA: Diagnosis not present

## 2023-11-25 ENCOUNTER — Encounter: Payer: Self-pay | Admitting: Family Medicine

## 2023-11-25 ENCOUNTER — Ambulatory Visit: Payer: Medicare Other | Admitting: Family Medicine

## 2023-11-25 VITALS — BP 137/75 | HR 90 | Temp 98.7°F | Ht 73.0 in | Wt 174.0 lb

## 2023-11-25 DIAGNOSIS — Z7901 Long term (current) use of anticoagulants: Secondary | ICD-10-CM

## 2023-11-25 DIAGNOSIS — I482 Chronic atrial fibrillation, unspecified: Secondary | ICD-10-CM

## 2023-11-25 LAB — COAGUCHEK XS/INR WAIVED
INR: 3.8 — ABNORMAL HIGH (ref 0.9–1.1)
Prothrombin Time: 45.1 s

## 2023-11-25 MED ORDER — TRIAMTERENE-HCTZ 37.5-25 MG PO TABS: 1.0000 | ORAL_TABLET | Freq: Every day | ORAL | 3 refills | Status: DC

## 2023-11-25 MED ORDER — DOXAZOSIN MESYLATE 8 MG PO TABS: 8.0000 mg | ORAL_TABLET | Freq: Every day | ORAL | 3 refills | Status: DC

## 2023-11-25 MED ORDER — LEVOTHYROXINE SODIUM 50 MCG PO TABS: 50.0000 ug | ORAL_TABLET | Freq: Every day | ORAL | 3 refills | Status: DC

## 2023-11-25 NOTE — Progress Notes (Unsigned)
Subjective: CC:INR check PCP: Raliegh Ip, DO HPI:Dalton Vaughan is a 89 y.o. male presenting to clinic today for:  1. AFib Goal 2-3. Reports compliance with 2.5mg  MF, 5mg  daily all others.  Had a nose bleed intermittently over the last couple of days. No new meds/ foods.   Did question if perhaps guaifenesin with dextromethorphan could cause some irritation of the gut.  He has tried guaifenesin independently and that has not ever bothered him but after adding DM this seemed to cause him almost a colitis   ROS: Per HPI  Allergies  Allergen Reactions   Paxil [Paroxetine Hcl] Palpitations   Codeine Other (See Comments)   Eliquis [Apixaban] Other (See Comments)    dizziness   Penicillins Other (See Comments)    Did it involve swelling of the face/tongue/throat, SOB, or low BP? Unknown Did it involve sudden or severe rash/hives, skin peeling, or any reaction on the inside of your mouth or nose? Unknown Did you need to seek medical attention at a hospital or doctor's office? Unknown When did it last happen?      unk If all above answers are "NO", may proceed with cephalosporin use.    Pravachol [Pravastatin Sodium] Other (See Comments)    myalgias   Zetia [Ezetimibe] Other (See Comments)    myalgias   Vytorin [Ezetimibe-Simvastatin] Other (See Comments)    myalgias   Past Medical History:  Diagnosis Date   Anal fissure    Arthritis    Atrial fibrillation (HCC)    BPH (benign prostatic hypertrophy)    CAD (coronary artery disease)    Dr. Donnie Aho   Cataract    COVID-19    Depression 2004   Diverticulosis    GERD (gastroesophageal reflux disease)    History of TIAs    Hyperlipidemia    Hypertension    Hypertensive heart disease without CHF    Hypothyroidism    Internal hemorrhoids    Lumbar disc disease    Oral bleeding 08/20/2012   Following molar tooth extraction July, 2013   Pneumonia    Ruptured disk 1995   Shingles    Thyroid disease    Vitamin D  deficiency    Vitreous detachment     Current Outpatient Medications:    amLODipine (NORVASC) 5 MG tablet, Take 1 tablet (5 mg total) by mouth daily., Disp: 100 tablet, Rfl: 3   azelastine (ASTELIN) 0.1 % nasal spray, Place 1 spray into both nostrils 2 (two) times daily., Disp: 90 mL, Rfl: 4   Calcium Carb-Cholecalciferol (CALCIUM + D3) 600-200 MG-UNIT TABS, Take 1 tablet by mouth daily. , Disp: , Rfl:    Cholecalciferol (VITAMIN D-3) 5000 UNITS TABS, Take 1 tablet by mouth daily., Disp: , Rfl:    meclizine (ANTIVERT) 25 MG tablet, Take 25 mg by mouth daily as needed for dizziness., Disp: , Rfl:    mirtazapine (REMERON) 15 MG tablet, Take 1 tablet (15 mg total) by mouth at bedtime., Disp: 100 tablet, Rfl: 3   Multiple Vitamin (MULTIVITAMIN) tablet, Take 1 tablet by mouth daily., Disp: , Rfl:    ramipril (ALTACE) 10 MG capsule, TAKE 1 CAPSULE DAILY, Disp: 90 capsule, Rfl: 0   rosuvastatin (CRESTOR) 5 MG tablet, Take 1 tablet by mouth Mondays, Wednesdays and Fridays, Disp: 36 tablet, Rfl: 3   SIMETHICONE PO, Take by mouth as needed., Disp: , Rfl:    vitamin C (ASCORBIC ACID) 500 MG tablet, Take 500 mg by mouth daily. , Disp: , Rfl:  warfarin (COUMADIN) 5 MG tablet, TAKE 1 TABLET DAILY AS DIRECTED, Disp: 90 tablet, Rfl: 3   doxazosin (CARDURA) 8 MG tablet, Take 1 tablet (8 mg total) by mouth at bedtime., Disp: 90 tablet, Rfl: 3   levothyroxine (SYNTHROID) 50 MCG tablet, Take 1 tablet (50 mcg total) by mouth daily before breakfast., Disp: 90 tablet, Rfl: 3   triamterene-hydrochlorothiazide (MAXZIDE-25) 37.5-25 MG tablet, Take 1 tablet by mouth daily., Disp: 90 tablet, Rfl: 3 Social History   Socioeconomic History   Marital status: Widowed    Spouse name: Harriett Sine    Number of children: 1   Years of education: 12   Highest education level: Not on file  Occupational History   Occupation: Retired    Veterinary surgeon: Company secretary x 27 years   Occupation: Retired    Associate Professor: Public librarian    Comment:  supervisor x 17 years  Tobacco Use   Smoking status: Former    Current packs/day: 0.00    Average packs/day: 1 pack/day for 18.0 years (18.0 ttl pk-yrs)    Types: Cigarettes    Start date: 12/17/1948    Quit date: 12/17/1966    Years since quitting: 56.9   Smokeless tobacco: Never  Vaping Use   Vaping status: Never Used  Substance and Sexual Activity   Alcohol use: Yes    Alcohol/week: 1.0 standard drink of alcohol    Types: 1 Standard drinks or equivalent per week    Comment: rare   Drug use: No   Sexual activity: Not on file  Other Topics Concern   Not on file  Social History Narrative   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[employer]      Right-handed      Caffeine use: none      Son and grandson live with him   Social Determinants of Health   Financial Resource Strain: Low Risk  (05/06/2023)   Received from [facility], [facility]   Overall Financial Resource Strain (CARDIA)    Difficulty of Paying Living Expenses: Not very hard  Food Insecurity: No Food Insecurity (06/24/2023)   Hunger Vital Sign    Worried About Running Out of Food in the Last Year: Never true    Ran Out of Food in the Last Year: Never true  Transportation Needs: No Transportation Needs (06/24/2023)   PRAPARE - Administrator, Civil Service (Medical): No    Lack of Transportation (Non-Medical): No  Physical Activity: Sufficiently Active (03/05/2023)   Exercise Vital Sign    Days of Exercise per Week: 5 days    Minutes of Exercise per Session: 60 min  Stress: No Stress Concern Present (03/05/2023)   [employer] of Occupational Health - Occupational Stress Questionnaire    Feeling of Stress : Not at all  Social Connections: Socially Isolated (03/05/2023)   Social Connection and Isolation Panel [NHANES]    Frequency of Communication with Friends and Family: More than three times a week    Frequency of Social Gatherings with Friends and Family: More than three  times a week    Attends Religious Services: Never    Database administrator or Organizations: No    Attends Banker Meetings: Never    Marital Status: Widowed  Intimate Partner Violence: Not At Risk (06/24/2023)   Humiliation, Afraid, Rape, and Kick questionnaire    Fear of Current or Ex-Partner: No    Emotionally Abused: No    Physically Abused: No  Sexually Abused: No   Family History  Problem Relation Age of Onset   Dementia Mother    Arthritis Mother    Cancer Brother        prostate   Asthma Sister    Heart disease Paternal Aunt    Heart disease Paternal Uncle    Hypertension Maternal Grandmother    CVA Maternal Grandmother    CVA Paternal Grandmother    Hepatitis C Son    Arthritis Son    Colon cancer Neg Hx    Colon polyps Neg Hx    Kidney disease Neg Hx    Esophageal cancer Neg Hx    Gallbladder disease Neg Hx    Diabetes Neg Hx     Objective: Office vital signs reviewed. BP 137/75   Pulse 90   Temp 98.7 F (37.1 C)   Ht 6\' 1"  (1.854 m)   Wt 174 lb (78.9 kg)   SpO2 96%   BMI 22.96 kg/m   Physical Examination:  General: Awake, alert, well nourished, No acute distress HEENT: hemostatic bleed on left nasal septum. Cardio: regular rate and rhythm, S1S2 heard, 1/6 murmurs appreciated at RSB Pulm: clear to auscultation bilaterally, no wheezes, rhonchi or rales; normal work of breathing on room air    Assessment/ Plan: 89 y.o. male   Chronic atrial fibrillation (HCC)  Anticoagulation monitoring, INR range 2-3 - Plan: CoaguChek XS/INR Waived  Rate and rhythm controlled.  INR supratherapeutic. Skip today and tomorrow's dose (total 1.5 tabs) and then reduce to 2.5mg  MWF and continue 5mg  daily all others. Handout provided. Follow up in 2 weeks as scheduled.   Raliegh Ip, DO [facility] [phone]

## 2023-11-26 ENCOUNTER — Encounter: Payer: Self-pay | Admitting: Family Medicine

## 2023-11-27 DIAGNOSIS — R972 Elevated prostate specific antigen [PSA]: Secondary | ICD-10-CM | POA: Diagnosis not present

## 2023-12-04 DIAGNOSIS — N401 Enlarged prostate with lower urinary tract symptoms: Secondary | ICD-10-CM | POA: Diagnosis not present

## 2023-12-04 DIAGNOSIS — R351 Nocturia: Secondary | ICD-10-CM | POA: Diagnosis not present

## 2023-12-04 DIAGNOSIS — R972 Elevated prostate specific antigen [PSA]: Secondary | ICD-10-CM | POA: Diagnosis not present

## 2023-12-10 ENCOUNTER — Ambulatory Visit (HOSPITAL_COMMUNITY)
Admission: RE | Admit: 2023-12-10 | Discharge: 2023-12-10 | Disposition: A | Discharge status: Home / Self Care | Payer: Medicare Other | Source: Ambulatory Visit | Attending: Surgery | Admitting: Surgery

## 2023-12-10 DIAGNOSIS — M542 Cervicalgia: Secondary | ICD-10-CM | POA: Diagnosis not present

## 2023-12-10 DIAGNOSIS — M5031 Other cervical disc degeneration,  high cervical region: Secondary | ICD-10-CM | POA: Diagnosis not present

## 2023-12-10 DIAGNOSIS — M4802 Spinal stenosis, cervical region: Secondary | ICD-10-CM | POA: Diagnosis not present

## 2023-12-10 DIAGNOSIS — M4852XA Collapsed vertebra, not elsewhere classified, cervical region, initial encounter for fracture: Secondary | ICD-10-CM | POA: Diagnosis not present

## 2023-12-10 DIAGNOSIS — M4319 Spondylolisthesis, multiple sites in spine: Secondary | ICD-10-CM | POA: Diagnosis not present

## 2023-12-13 ENCOUNTER — Encounter: Payer: Self-pay | Admitting: Family Medicine

## 2023-12-13 ENCOUNTER — Ambulatory Visit (INDEPENDENT_AMBULATORY_CARE_PROVIDER_SITE_OTHER): Payer: Medicare Other | Admitting: Family Medicine

## 2023-12-13 VITALS — BP 127/70 | HR 85 | Temp 98.7°F | Ht 72.0 in | Wt 177.6 lb

## 2023-12-13 DIAGNOSIS — I482 Chronic atrial fibrillation, unspecified: Secondary | ICD-10-CM

## 2023-12-13 DIAGNOSIS — J069 Acute upper respiratory infection, unspecified: Secondary | ICD-10-CM

## 2023-12-13 DIAGNOSIS — Z7901 Long term (current) use of anticoagulants: Secondary | ICD-10-CM | POA: Diagnosis not present

## 2023-12-13 LAB — COAGUCHEK XS/INR WAIVED
INR: 4 — ABNORMAL HIGH (ref 0.9–1.1)
Prothrombin Time: 47.5 s

## 2023-12-13 MED ORDER — TRIAMTERENE-HCTZ 37.5-25 MG PO TABS: 1.0000 | ORAL_TABLET | Freq: Every day | ORAL | 3 refills | Status: DC

## 2023-12-13 NOTE — Progress Notes (Signed)
Subjective: CC: INR recheck PCP: Raliegh Ip, DO HPI:Dalton Vaughan is a 89 y.o. male presenting to clinic today for:  1.  Atrial fibrillation Patient has had elevated INR check and is here for recheck.  He was advised to skip 2 days of dosing and then reduce to 1/2 tablet Monday Wednesday Friday with 1 tablet daily all others.  He does not recall any type of manufacturer change but will take a look at the bottles when he gets home.  Denies any bleeding episodes.  2.  URI He reports onset of some sinus pressure and runny nose on Monday.  No purulence, fevers.  He gets this yearly but typically turned into a bacterial infection.  He has been using Zicam and that does seem to be helping.   ROS: Per HPI  Allergies  Allergen Reactions   Paxil [Paroxetine Hcl] Palpitations   Codeine Other (See Comments)   Eliquis [Apixaban] Other (See Comments)    dizziness   Penicillins Other (See Comments)    Did it involve swelling of the face/tongue/throat, SOB, or low BP? Unknown Did it involve sudden or severe rash/hives, skin peeling, or any reaction on the inside of your mouth or nose? Unknown Did you need to seek medical attention at a hospital or doctor's office? Unknown When did it last happen?      unk If all above answers are "NO", may proceed with cephalosporin use.    Pravachol [Pravastatin Sodium] Other (See Comments)    myalgias   Zetia [Ezetimibe] Other (See Comments)    myalgias   Vytorin [Ezetimibe-Simvastatin] Other (See Comments)    myalgias   Past Medical History:  Diagnosis Date   Anal fissure    Arthritis    Atrial fibrillation (HCC)    BPH (benign prostatic hypertrophy)    CAD (coronary artery disease)    Dr. Donnie Aho   Cataract    COVID-19    Depression 2004   Diverticulosis    GERD (gastroesophageal reflux disease)    History of TIAs    Hyperlipidemia    Hypertension    Hypertensive heart disease without CHF    Hypothyroidism    Internal hemorrhoids     Lumbar disc disease    Oral bleeding 08/20/2012   Following molar tooth extraction July, 2013   Pneumonia    Ruptured disk 1995   Shingles    Thyroid disease    Vitamin D deficiency    Vitreous detachment     Current Outpatient Medications:    amLODipine (NORVASC) 5 MG tablet, Take 1 tablet (5 mg total) by mouth daily., Disp: 100 tablet, Rfl: 3   azelastine (ASTELIN) 0.1 % nasal spray, Place 1 spray into both nostrils 2 (two) times daily., Disp: 90 mL, Rfl: 4   Calcium Carb-Cholecalciferol (CALCIUM + D3) 600-200 MG-UNIT TABS, Take 1 tablet by mouth daily. , Disp: , Rfl:    Cholecalciferol (VITAMIN D-3) 5000 UNITS TABS, Take 1 tablet by mouth daily., Disp: , Rfl:    doxazosin (CARDURA) 8 MG tablet, Take 1 tablet (8 mg total) by mouth at bedtime., Disp: 90 tablet, Rfl: 3   levothyroxine (SYNTHROID) 50 MCG tablet, Take 1 tablet (50 mcg total) by mouth daily before breakfast., Disp: 90 tablet, Rfl: 3   meclizine (ANTIVERT) 25 MG tablet, Take 25 mg by mouth daily as needed for dizziness., Disp: , Rfl:    mirtazapine (REMERON) 15 MG tablet, Take 1 tablet (15 mg total) by mouth at bedtime., Disp: 100  tablet, Rfl: 3   Multiple Vitamin (MULTIVITAMIN) tablet, Take 1 tablet by mouth daily., Disp: , Rfl:    ramipril (ALTACE) 10 MG capsule, TAKE 1 CAPSULE DAILY, Disp: 90 capsule, Rfl: 0   rosuvastatin (CRESTOR) 5 MG tablet, Take 1 tablet by mouth Mondays, Wednesdays and Fridays, Disp: 36 tablet, Rfl: 3   SIMETHICONE PO, Take by mouth as needed., Disp: , Rfl:    vitamin C (ASCORBIC ACID) 500 MG tablet, Take 500 mg by mouth daily. , Disp: , Rfl:    warfarin (COUMADIN) 5 MG tablet, TAKE 1 TABLET DAILY AS DIRECTED, Disp: 90 tablet, Rfl: 3   triamterene-hydrochlorothiazide (MAXZIDE-25) 37.5-25 MG tablet, Take 1 tablet by mouth daily., Disp: 90 tablet, Rfl: 3 Social History   Socioeconomic History   Marital status: Widowed    Spouse name: Harriett Sine    Number of children: 1   Years of education: 12    Highest education level: Not on file  Occupational History   Occupation: Retired    Veterinary surgeon: Company secretary x 27 years   Occupation: Retired    Associate Professor: Public librarian    Comment: supervisor x 17 years  Tobacco Use   Smoking status: Former    Current packs/day: 0.00    Average packs/day: 1 pack/day for 18.0 years (18.0 ttl pk-yrs)    Types: Cigarettes    Start date: 12/17/1948    Quit date: 12/17/1966    Years since quitting: 57.0   Smokeless tobacco: Never  Vaping Use   Vaping status: Never Used  Substance and Sexual Activity   Alcohol use: Yes    Alcohol/week: 1.0 standard drink of alcohol    Types: 1 Standard drinks or equivalent per week    Comment: rare   Drug use: No   Sexual activity: Not on file  Other Topics Concern   Not on file  Social History Narrative   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[employer]      Right-handed      Caffeine use: none      Son and grandson live with him   Social Drivers of Corporate investment banker Strain: Low Risk  (05/06/2023)   Received from [facility], [facility]   Overall Financial Resource Strain (CARDIA)    Difficulty of Paying Living Expenses: Not very hard  Food Insecurity: No Food Insecurity (06/24/2023)   Hunger Vital Sign    Worried About Running Out of Food in the Last Year: Never true    Ran Out of Food in the Last Year: Never true  Transportation Needs: No Transportation Needs (06/24/2023)   PRAPARE - Administrator, Civil Service (Medical): No    Lack of Transportation (Non-Medical): No  Physical Activity: Sufficiently Active (03/05/2023)   Exercise Vital Sign    Days of Exercise per Week: 5 days    Minutes of Exercise per Session: 60 min  Stress: No Stress Concern Present (03/05/2023)   [employer] of Occupational Health - Occupational Stress Questionnaire    Feeling of Stress : Not at all  Social Connections: Socially Isolated (03/05/2023)   Social Connection and Isolation  Panel [NHANES]    Frequency of Communication with Friends and Family: More than three times a week    Frequency of Social Gatherings with Friends and Family: More than three times a week    Attends Religious Services: Never    Database administrator or Organizations: No    Attends Club or  Organization Meetings: Never    Marital Status: Widowed  Intimate Partner Violence: Not At Risk (06/24/2023)   Humiliation, Afraid, Rape, and Kick questionnaire    Fear of Current or Ex-Partner: No    Emotionally Abused: No    Physically Abused: No    Sexually Abused: No   Family History  Problem Relation Age of Onset   Dementia Mother    Arthritis Mother    Cancer Brother        prostate   Asthma Sister    Heart disease Paternal Aunt    Heart disease Paternal Uncle    Hypertension Maternal Grandmother    CVA Maternal Grandmother    CVA Paternal Grandmother    Hepatitis C Son    Arthritis Son    Colon cancer Neg Hx    Colon polyps Neg Hx    Kidney disease Neg Hx    Esophageal cancer Neg Hx    Gallbladder disease Neg Hx    Diabetes Neg Hx     Objective: Office vital signs reviewed. BP 127/70   Pulse 85   Temp 98.7 F (37.1 C)   Ht 6' (1.829 m)   Wt 177 lb 9.6 oz (80.6 kg)   SpO2 95%   BMI 24.09 kg/m   Physical Examination:  General: Awake, alert, well nourished, No acute distress HEENT: Sclera white.  Moist mucous membranes.  TMs intact bilaterally.  Minimally erythematous nasal turbinates without any purulence.  Oropharynx without exudates or erythema Cardio: regular rate and rhythm, S1S2 heard, no murmurs appreciated Pulm: clear to auscultation bilaterally, no wheezes, rhonchi or rales; normal work of breathing on room air    Assessment/ Plan: 89 y.o. male   Anticoagulation monitoring, INR range 2-3 - Plan: CoaguChek XS/INR Waived  Viral URI  INR remains supratherapeutic and even more so than previous.  I have recommended that he skip the weekends dose Friday, Saturday  and Sunday and then resume half tablet daily going forward.  We will see him back in about a week and a half.  I evaluated whether or not Zicam has any interaction with warfarin and could not find that they interact.   Raliegh Ip, DO [facility] [phone]

## 2023-12-25 ENCOUNTER — Ambulatory Visit: Payer: Medicare Other | Admitting: Family Medicine

## 2023-12-25 ENCOUNTER — Telehealth: Payer: Self-pay

## 2023-12-25 ENCOUNTER — Encounter: Payer: Self-pay | Admitting: Family Medicine

## 2023-12-25 VITALS — BP 118/62 | HR 71 | Temp 97.9°F | Ht 72.0 in | Wt 176.0 lb

## 2023-12-25 DIAGNOSIS — Z7901 Long term (current) use of anticoagulants: Secondary | ICD-10-CM

## 2023-12-25 DIAGNOSIS — I482 Chronic atrial fibrillation, unspecified: Secondary | ICD-10-CM | POA: Diagnosis not present

## 2023-12-25 LAB — COAGUCHEK XS/INR WAIVED
INR: 1.3 — ABNORMAL HIGH (ref 0.9–1.1)
Prothrombin Time: 15.3 s

## 2023-12-25 NOTE — Progress Notes (Signed)
 Subjective: CC: A-fib PCP: Jolinda Norene HERO, DO HPI:Dalton Vaughan is a 89 y.o. male presenting to clinic today for:  1.  Atrial fibrillation Goal INR 2-3 Patient is been taking half tablet of Coumadin  daily since last visit as he was supratherapeutic at that time.  He missed his morning appointment because he had another episode of the vertigo and he felt pretty poorly.  That is since resolved and he is feeling fine now.   ROS: Per HPI  Allergies  Allergen Reactions   Paxil [Paroxetine Hcl] Palpitations   Codeine Other (See Comments)   Eliquis  [Apixaban ] Other (See Comments)    dizziness   Penicillins Other (See Comments)    Did it involve swelling of the face/tongue/throat, SOB, or low BP? Unknown Did it involve sudden or severe rash/hives, skin peeling, or any reaction on the inside of your mouth or nose? Unknown Did you need to seek medical attention at a hospital or doctor's office? Unknown When did it last happen?      unk If all above answers are "NO", may proceed with cephalosporin use.    Pravachol [Pravastatin Sodium] Other (See Comments)    myalgias   Zetia  [Ezetimibe ] Other (See Comments)    myalgias   Vytorin [Ezetimibe -Simvastatin] Other (See Comments)    myalgias   Past Medical History:  Diagnosis Date   Anal fissure    Arthritis    Atrial fibrillation (HCC)    BPH (benign prostatic hypertrophy)    CAD (coronary artery disease)    Dr. Blanca   Cataract    COVID-19    Depression 2004   Diverticulosis    GERD (gastroesophageal reflux disease)    History of TIAs    Hyperlipidemia    Hypertension    Hypertensive heart disease without CHF    Hypothyroidism    Internal hemorrhoids    Lumbar disc disease    Oral bleeding 08/20/2012   Following molar tooth extraction July, 2013   Pneumonia    Ruptured disk 1995   Shingles    Thyroid  disease    Vitamin D  deficiency    Vitreous detachment     Current Outpatient Medications:    amLODipine  (NORVASC )  5 MG tablet, Take 1 tablet (5 mg total) by mouth daily., Disp: 100 tablet, Rfl: 3   azelastine  (ASTELIN ) 0.1 % nasal spray, Place 1 spray into both nostrils 2 (two) times daily., Disp: 90 mL, Rfl: 4   Calcium  Carb-Cholecalciferol (CALCIUM  + D3) 600-200 MG-UNIT TABS, Take 1 tablet by mouth daily. , Disp: , Rfl:    Cholecalciferol (VITAMIN D -3) 5000 UNITS TABS, Take 1 tablet by mouth daily., Disp: , Rfl:    doxazosin  (CARDURA ) 8 MG tablet, Take 1 tablet (8 mg total) by mouth at bedtime., Disp: 90 tablet, Rfl: 3   levothyroxine  (SYNTHROID ) 50 MCG tablet, Take 1 tablet (50 mcg total) by mouth daily before breakfast., Disp: 90 tablet, Rfl: 3   meclizine (ANTIVERT) 25 MG tablet, Take 25 mg by mouth daily as needed for dizziness., Disp: , Rfl:    mirtazapine  (REMERON ) 15 MG tablet, Take 1 tablet (15 mg total) by mouth at bedtime., Disp: 100 tablet, Rfl: 3   Multiple Vitamin (MULTIVITAMIN) tablet, Take 1 tablet by mouth daily., Disp: , Rfl:    ramipril  (ALTACE ) 10 MG capsule, TAKE 1 CAPSULE DAILY, Disp: 90 capsule, Rfl: 0   rosuvastatin  (CRESTOR ) 5 MG tablet, Take 1 tablet by mouth Mondays, Wednesdays and Fridays, Disp: 36 tablet, Rfl: 3  SIMETHICONE  PO, Take by mouth as needed., Disp: , Rfl:    triamterene -hydrochlorothiazide  (MAXZIDE -25) 37.5-25 MG tablet, Take 1 tablet by mouth daily., Disp: 90 tablet, Rfl: 3   vitamin C (ASCORBIC ACID) 500 MG tablet, Take 500 mg by mouth daily. , Disp: , Rfl:    warfarin (COUMADIN ) 5 MG tablet, TAKE 1 TABLET DAILY AS DIRECTED, Disp: 90 tablet, Rfl: 3 Social History   Socioeconomic History   Marital status: Widowed    Spouse name: [name]    Number of children: 1   Years of education: 12   Highest education level: Not on file  Occupational History   Occupation: Retired    Veterinary Surgeon: Company Secretary x 27 years   Occupation: Retired    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    Comment: supervisor x 17 years  Tobacco Use   Smoking status: Former    Current packs/day: 0.00     Average packs/day: 1 pack/day for 18.0 years (18.0 ttl pk-yrs)    Types: Cigarettes    Start date: 12/17/1948    Quit date: 12/17/1966    Years since quitting: 57.0   Smokeless tobacco: Never  Vaping Use   Vaping status: Never Used  Substance and Sexual Activity   Alcohol use: Yes    Alcohol/week: 1.0 standard drink of alcohol    Types: 1 Standard drinks or equivalent per week    Comment: rare   Drug use: No   Sexual activity: Not on file  Other Topics Concern   Not on file  Social History Narrative   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-handed      Caffeine use: none      Son and grandson live with him   Social Drivers of Corporate Investment Banker Strain: Low Risk  (05/06/2023)   Received from [facility], [facility]   Overall Financial Resource Strain (CARDIA)    Difficulty of Paying Living Expenses: Not very hard  Food Insecurity: No Food Insecurity (06/24/2023)   Hunger Vital Sign    Worried About Running Out of Food in the Last Year: Never true    Ran Out of Food in the Last Year: Never true  Transportation Needs: No Transportation Needs (06/24/2023)   PRAPARE - Administrator, Civil Service (Medical): No    Lack of Transportation (Non-Medical): No  Physical Activity: Sufficiently Active (03/05/2023)   Exercise Vital Sign    Days of Exercise per Week: 5 days    Minutes of Exercise per Session: 60 min  Stress: No Stress Concern Present (03/05/2023)   [employer] of Occupational Health - Occupational Stress Questionnaire    Feeling of Stress : Not at all  Social Connections: Socially Isolated (03/05/2023)   Social Connection and Isolation Panel [NHANES]    Frequency of Communication with Friends and Family: More than three times a week    Frequency of Social Gatherings with Friends and Family: More than three times a week    Attends Religious Services: Never    Database Administrator or Organizations: No     Attends Banker Meetings: Never    Marital Status: Widowed  Intimate Partner Violence: Not At Risk (06/24/2023)   Humiliation, Afraid, Rape, and Kick questionnaire    Fear of Current or Ex-Partner: No    Emotionally Abused: No    Physically Abused: No    Sexually Abused: No   Family History  Problem  Relation Age of Onset   Dementia Mother    Arthritis Mother    Cancer Brother        prostate   Asthma Sister    Heart disease Paternal Aunt    Heart disease Paternal Uncle    Hypertension Maternal Grandmother    CVA Maternal Grandmother    CVA Paternal Grandmother    Hepatitis C Son    Arthritis Son    Colon cancer Neg Hx    Colon polyps Neg Hx    Kidney disease Neg Hx    Esophageal cancer Neg Hx    Gallbladder disease Neg Hx    Diabetes Neg Hx     Objective: Office vital signs reviewed. BP 118/62   Pulse 71   Temp 97.9 F (36.6 C)   Ht 6' (1.829 m)   Wt 176 lb (79.8 kg)   SpO2 96%   BMI 23.87 kg/m   Physical Examination:  General: Awake, alert, nontoxic-appearing male, No acute distress HEENT: No nystagmus.  Assessment/ Plan: 89 y.o. male   Chronic atrial fibrillation (HCC)  Anticoagulation monitoring, INR range 2-3 - Plan: CoaguChek XS/INR Waived  INR is subtherapeutic now.  He will increase to 1 tablet today and 1 tablet on Friday and continue half tablet daily all others.  Plan to follow-up with him in 2 weeks for INR recheck, sooner if concerns arise   Ruwayda Curet CHRISTELLA Fielding, DO [facility] [phone]

## 2023-12-25 NOTE — Telephone Encounter (Signed)
 Spoke with pt he is coming in today

## 2023-12-25 NOTE — Progress Notes (Deleted)
 Subjective: CC:*** PCP: Jolinda Norene HERO, DO HPI:Muhsin Reddinger is a 89 y.o. male presenting to clinic today for:  1. ***   ROS: Per HPI  Allergies  Allergen Reactions   Paxil [Paroxetine Hcl] Palpitations   Codeine Other (See Comments)   Eliquis  [Apixaban ] Other (See Comments)    dizziness   Penicillins Other (See Comments)    Did it involve swelling of the face/tongue/throat, SOB, or low BP? Unknown Did it involve sudden or severe rash/hives, skin peeling, or any reaction on the inside of your mouth or nose? Unknown Did you need to seek medical attention at a hospital or doctor's office? Unknown When did it last happen?      unk If all above answers are "NO", may proceed with cephalosporin use.    Pravachol [Pravastatin Sodium] Other (See Comments)    myalgias   Zetia  [Ezetimibe ] Other (See Comments)    myalgias   Vytorin [Ezetimibe -Simvastatin] Other (See Comments)    myalgias   Past Medical History:  Diagnosis Date   Anal fissure    Arthritis    Atrial fibrillation (HCC)    BPH (benign prostatic hypertrophy)    CAD (coronary artery disease)    Dr. Blanca   Cataract    COVID-19    Depression 2004   Diverticulosis    GERD (gastroesophageal reflux disease)    History of TIAs    Hyperlipidemia    Hypertension    Hypertensive heart disease without CHF    Hypothyroidism    Internal hemorrhoids    Lumbar disc disease    Oral bleeding 08/20/2012   Following molar tooth extraction July, 2013   Pneumonia    Ruptured disk 1995   Shingles    Thyroid  disease    Vitamin D  deficiency    Vitreous detachment     Current Outpatient Medications:    amLODipine  (NORVASC ) 5 MG tablet, Take 1 tablet (5 mg total) by mouth daily., Disp: 100 tablet, Rfl: 3   azelastine  (ASTELIN ) 0.1 % nasal spray, Place 1 spray into both nostrils 2 (two) times daily., Disp: 90 mL, Rfl: 4   Calcium  Carb-Cholecalciferol (CALCIUM  + D3) 600-200 MG-UNIT TABS, Take 1 tablet by mouth daily. ,  Disp: , Rfl:    Cholecalciferol (VITAMIN D -3) 5000 UNITS TABS, Take 1 tablet by mouth daily., Disp: , Rfl:    doxazosin  (CARDURA ) 8 MG tablet, Take 1 tablet (8 mg total) by mouth at bedtime., Disp: 90 tablet, Rfl: 3   levothyroxine  (SYNTHROID ) 50 MCG tablet, Take 1 tablet (50 mcg total) by mouth daily before breakfast., Disp: 90 tablet, Rfl: 3   meclizine (ANTIVERT) 25 MG tablet, Take 25 mg by mouth daily as needed for dizziness., Disp: , Rfl:    mirtazapine  (REMERON ) 15 MG tablet, Take 1 tablet (15 mg total) by mouth at bedtime., Disp: 100 tablet, Rfl: 3   Multiple Vitamin (MULTIVITAMIN) tablet, Take 1 tablet by mouth daily., Disp: , Rfl:    ramipril  (ALTACE ) 10 MG capsule, TAKE 1 CAPSULE DAILY, Disp: 90 capsule, Rfl: 0   rosuvastatin  (CRESTOR ) 5 MG tablet, Take 1 tablet by mouth Mondays, Wednesdays and Fridays, Disp: 36 tablet, Rfl: 3   SIMETHICONE  PO, Take by mouth as needed., Disp: , Rfl:    triamterene -hydrochlorothiazide  (MAXZIDE -25) 37.5-25 MG tablet, Take 1 tablet by mouth daily., Disp: 90 tablet, Rfl: 3   vitamin C (ASCORBIC ACID) 500 MG tablet, Take 500 mg by mouth daily. , Disp: , Rfl:    warfarin (COUMADIN ) 5  MG tablet, TAKE 1 TABLET DAILY AS DIRECTED, Disp: 90 tablet, Rfl: 3 Social History   Socioeconomic History   Marital status: Widowed    Spouse name: [name]    Number of children: 1   Years of education: 12   Highest education level: Not on file  Occupational History   Occupation: Retired    Veterinary Surgeon: Company Secretary x 27 years   Occupation: Retired    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    Comment: supervisor x 17 years  Tobacco Use   Smoking status: Former    Current packs/day: 0.00    Average packs/day: 1 pack/day for 18.0 years (18.0 ttl pk-yrs)    Types: Cigarettes    Start date: 12/17/1948    Quit date: 12/17/1966    Years since quitting: 57.0   Smokeless tobacco: Never  Vaping Use   Vaping status: Never Used  Substance and Sexual Activity   Alcohol use: Yes    Alcohol/week:  1.0 standard drink of alcohol    Types: 1 Standard drinks or equivalent per week    Comment: rare   Drug use: No   Sexual activity: Not on file  Other Topics Concern   Not on file  Social History Narrative   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-handed      Caffeine use: none      Son and grandson live with him   Social Drivers of Corporate Investment Banker Strain: Low Risk  (05/06/2023)   Received from [facility], [facility]   Overall Financial Resource Strain (CARDIA)    Difficulty of Paying Living Expenses: Not very hard  Food Insecurity: No Food Insecurity (06/24/2023)   Hunger Vital Sign    Worried About Running Out of Food in the Last Year: Never true    Ran Out of Food in the Last Year: Never true  Transportation Needs: No Transportation Needs (06/24/2023)   PRAPARE - Administrator, Civil Service (Medical): No    Lack of Transportation (Non-Medical): No  Physical Activity: Sufficiently Active (03/05/2023)   Exercise Vital Sign    Days of Exercise per Week: 5 days    Minutes of Exercise per Session: 60 min  Stress: No Stress Concern Present (03/05/2023)   [employer] of Occupational Health - Occupational Stress Questionnaire    Feeling of Stress : Not at all  Social Connections: Socially Isolated (03/05/2023)   Social Connection and Isolation Panel [NHANES]    Frequency of Communication with Friends and Family: More than three times a week    Frequency of Social Gatherings with Friends and Family: More than three times a week    Attends Religious Services: Never    Database Administrator or Organizations: No    Attends Banker Meetings: Never    Marital Status: Widowed  Intimate Partner Violence: Not At Risk (06/24/2023)   Humiliation, Afraid, Rape, and Kick questionnaire    Fear of Current or Ex-Partner: No    Emotionally Abused: No    Physically Abused: No    Sexually Abused: No   Family  History  Problem Relation Age of Onset   Dementia Mother    Arthritis Mother    Cancer Brother        prostate   Asthma Sister    Heart disease Paternal Aunt    Heart disease Paternal Uncle    Hypertension Maternal Grandmother    CVA  Maternal Grandmother    CVA Paternal Grandmother    Hepatitis C Son    Arthritis Son    Colon cancer Neg Hx    Colon polyps Neg Hx    Kidney disease Neg Hx    Esophageal cancer Neg Hx    Gallbladder disease Neg Hx    Diabetes Neg Hx     Objective: Office vital signs reviewed. There were no vitals taken for this visit.  Physical Examination:  General: Awake, alert, *** nourished, No acute distress HEENT: Normal    Neck: No masses palpated. No lymphadenopathy    Ears: Tympanic membranes intact, normal light reflex, no erythema, no bulging    Eyes: PERRLA, extraocular membranes intact, sclera ***    Nose: nasal turbinates moist, *** nasal discharge    Throat: moist mucus membranes, no erythema, *** tonsillar exudate.  Airway is patent Cardio: regular rate and rhythm, S1S2 heard, no murmurs appreciated Pulm: clear to auscultation bilaterally, no wheezes, rhonchi or rales; normal work of breathing on room air GI: soft, non-tender, non-distended, bowel sounds present x4, no hepatomegaly, no splenomegaly, no masses GU: external vaginal tissue ***, cervix ***, *** punctate lesions on cervix appreciated, *** discharge from cervical os, *** bleeding, *** cervical motion tenderness, *** abdominal/ adnexal masses Extremities: warm, well perfused, No edema, cyanosis or clubbing; +*** pulses bilaterally MSK: *** gait and *** station Skin: dry; intact; no rashes or lesions Neuro: *** Strength and light touch sensation grossly intact, *** DTRs ***/4  Assessment/ Plan: 89 y.o. male   Chronic atrial fibrillation (HCC)  Anticoagulation monitoring, INR range 2-3  ***   Jamita Mckelvin CHRISTELLA Fielding, DO [facility] [phone]

## 2023-12-25 NOTE — Telephone Encounter (Signed)
 Copied from CRM [id number]. Topic: Clinical - Medical Advice >> Dec 25, 2023 12:44 PM Montie POUR wrote: Reason for CRM: Dalton Vaughan is calling Dr. Carney nurse back. The nurse called him to reschedule his INR check for this Friday 12/27/23, I did not have an option to schedule the appointment with her on 12/27/23. Please call [name] back.

## 2023-12-27 ENCOUNTER — Ambulatory Visit: Payer: Medicare Other | Admitting: Family Medicine

## 2023-12-30 ENCOUNTER — Other Ambulatory Visit (HOSPITAL_COMMUNITY): Payer: Self-pay | Admitting: Surgery

## 2023-12-30 DIAGNOSIS — R270 Ataxia, unspecified: Secondary | ICD-10-CM | POA: Diagnosis not present

## 2023-12-30 DIAGNOSIS — M47812 Spondylosis without myelopathy or radiculopathy, cervical region: Secondary | ICD-10-CM | POA: Diagnosis not present

## 2024-01-07 ENCOUNTER — Encounter: Payer: Self-pay | Admitting: Family Medicine

## 2024-01-07 ENCOUNTER — Ambulatory Visit (INDEPENDENT_AMBULATORY_CARE_PROVIDER_SITE_OTHER): Payer: Medicare Other | Admitting: Family Medicine

## 2024-01-07 VITALS — BP 136/70 | HR 80 | Temp 97.9°F | Ht 72.0 in | Wt 176.0 lb

## 2024-01-07 DIAGNOSIS — Z7901 Long term (current) use of anticoagulants: Secondary | ICD-10-CM

## 2024-01-07 DIAGNOSIS — I482 Chronic atrial fibrillation, unspecified: Secondary | ICD-10-CM

## 2024-01-07 LAB — COAGUCHEK XS/INR WAIVED
INR: 2 — ABNORMAL HIGH (ref 0.9–1.1)
Prothrombin Time: 24 s

## 2024-01-07 NOTE — Progress Notes (Signed)
Subjective: CC: INR check PCP: Raliegh Ip, DO HPI:Dalton Vaughan is a 89 y.o. male presenting to clinic today for:  1. Chronic atrial fibrillation Goal INR 2-3. INR noted to be subtherapeutic at last visit.  He was increased by 1 tablet day of visit and then instructed to take 1 tablet on Friday and continue half tablet daily all others.  No bleeding reported.  He continues to have intermittent dizzy spells but nothing as bad as he had on the eighth.  Is currently being evaluated by neurology and has an MRI brain without contrast coming up soon.   ROS: Per HPI  Allergies  Allergen Reactions   Paxil [Paroxetine Hcl] Palpitations   Codeine Other (See Comments)   Eliquis [Apixaban] Other (See Comments)    dizziness   Penicillins Other (See Comments)    Did it involve swelling of the face/tongue/throat, SOB, or low BP? Unknown Did it involve sudden or severe rash/hives, skin peeling, or any reaction on the inside of your mouth or nose? Unknown Did you need to seek medical attention at a hospital or doctor's office? Unknown When did it last happen?      unk If all above answers are "NO", may proceed with cephalosporin use.    Pravachol [Pravastatin Sodium] Other (See Comments)    myalgias   Zetia [Ezetimibe] Other (See Comments)    myalgias   Vytorin [Ezetimibe-Simvastatin] Other (See Comments)    myalgias   Past Medical History:  Diagnosis Date   Anal fissure    Arthritis    Atrial fibrillation (HCC)    BPH (benign prostatic hypertrophy)    CAD (coronary artery disease)    Dr. Donnie Aho   Cataract    COVID-19    Depression 2004   Diverticulosis    GERD (gastroesophageal reflux disease)    History of TIAs    Hyperlipidemia    Hypertension    Hypertensive heart disease without CHF    Hypothyroidism    Internal hemorrhoids    Lumbar disc disease    Oral bleeding 08/20/2012   Following molar tooth extraction July, 2013   Pneumonia    Ruptured disk 1995   Shingles     Thyroid disease    Vitamin D deficiency    Vitreous detachment     Current Outpatient Medications:    amLODipine (NORVASC) 5 MG tablet, Take 1 tablet (5 mg total) by mouth daily., Disp: 100 tablet, Rfl: 3   azelastine (ASTELIN) 0.1 % nasal spray, Place 1 spray into both nostrils 2 (two) times daily., Disp: 90 mL, Rfl: 4   Calcium Carb-Cholecalciferol (CALCIUM + D3) 600-200 MG-UNIT TABS, Take 1 tablet by mouth daily. , Disp: , Rfl:    Cholecalciferol (VITAMIN D-3) 5000 UNITS TABS, Take 1 tablet by mouth daily., Disp: , Rfl:    doxazosin (CARDURA) 8 MG tablet, Take 1 tablet (8 mg total) by mouth at bedtime., Disp: 90 tablet, Rfl: 3   levothyroxine (SYNTHROID) 50 MCG tablet, Take 1 tablet (50 mcg total) by mouth daily before breakfast., Disp: 90 tablet, Rfl: 3   meclizine (ANTIVERT) 25 MG tablet, Take 25 mg by mouth daily as needed for dizziness., Disp: , Rfl:    mirtazapine (REMERON) 15 MG tablet, Take 1 tablet (15 mg total) by mouth at bedtime., Disp: 100 tablet, Rfl: 3   Multiple Vitamin (MULTIVITAMIN) tablet, Take 1 tablet by mouth daily., Disp: , Rfl:    ramipril (ALTACE) 10 MG capsule, TAKE 1 CAPSULE DAILY, Disp: 90  capsule, Rfl: 0   rosuvastatin (CRESTOR) 5 MG tablet, Take 1 tablet by mouth Mondays, Wednesdays and Fridays, Disp: 36 tablet, Rfl: 3   SIMETHICONE PO, Take by mouth as needed., Disp: , Rfl:    triamterene-hydrochlorothiazide (MAXZIDE-25) 37.5-25 MG tablet, Take 1 tablet by mouth daily., Disp: 90 tablet, Rfl: 3   vitamin C (ASCORBIC ACID) 500 MG tablet, Take 500 mg by mouth daily. , Disp: , Rfl:    warfarin (COUMADIN) 5 MG tablet, TAKE 1 TABLET DAILY AS DIRECTED, Disp: 90 tablet, Rfl: 3 Social History   Socioeconomic History   Marital status: Widowed    Spouse name: Harriett Sine    Number of children: 1   Years of education: 12   Highest education level: Not on file  Occupational History   Occupation: Retired    Veterinary surgeon: Company secretary x 27 years   Occupation: Retired     Associate Professor: Public librarian    Comment: supervisor x 17 years  Tobacco Use   Smoking status: Former    Current packs/day: 0.00    Average packs/day: 1 pack/day for 18.0 years (18.0 ttl pk-yrs)    Types: Cigarettes    Start date: 12/17/1948    Quit date: 12/17/1966    Years since quitting: 57.0   Smokeless tobacco: Never  Vaping Use   Vaping status: Never Used  Substance and Sexual Activity   Alcohol use: Yes    Alcohol/week: 1.0 standard drink of alcohol    Types: 1 Standard drinks or equivalent per week    Comment: rare   Drug use: No   Sexual activity: Not on file  Other Topics Concern   Not on file  Social History Narrative   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[employer]      Right-handed      Caffeine use: none      Son and grandson live with him   Social Drivers of Corporate investment banker Strain: Low Risk  (05/06/2023)   Received from [facility], [facility]   Overall Financial Resource Strain (CARDIA)    Difficulty of Paying Living Expenses: Not very hard  Food Insecurity: No Food Insecurity (06/24/2023)   Hunger Vital Sign    Worried About Running Out of Food in the Last Year: Never true    Ran Out of Food in the Last Year: Never true  Transportation Needs: No Transportation Needs (06/24/2023)   PRAPARE - Administrator, Civil Service (Medical): No    Lack of Transportation (Non-Medical): No  Physical Activity: Sufficiently Active (03/05/2023)   Exercise Vital Sign    Days of Exercise per Week: 5 days    Minutes of Exercise per Session: 60 min  Stress: No Stress Concern Present (03/05/2023)   [employer] of Occupational Health - Occupational Stress Questionnaire    Feeling of Stress : Not at all  Social Connections: Socially Isolated (03/05/2023)   Social Connection and Isolation Panel [NHANES]    Frequency of Communication with Friends and Family: More than three times a week    Frequency of Social Gatherings with  Friends and Family: More than three times a week    Attends Religious Services: Never    Database administrator or Organizations: No    Attends Banker Meetings: Never    Marital Status: Widowed  Intimate Partner Violence: Not At Risk (06/24/2023)   Humiliation, Afraid, Rape, and Kick questionnaire    Fear of Current or  Ex-Partner: No    Emotionally Abused: No    Physically Abused: No    Sexually Abused: No   Family History  Problem Relation Age of Onset   Dementia Mother    Arthritis Mother    Cancer Brother        prostate   Asthma Sister    Heart disease Paternal Aunt    Heart disease Paternal Uncle    Hypertension Maternal Grandmother    CVA Maternal Grandmother    CVA Paternal Grandmother    Hepatitis C Son    Arthritis Son    Colon cancer Neg Hx    Colon polyps Neg Hx    Kidney disease Neg Hx    Esophageal cancer Neg Hx    Gallbladder disease Neg Hx    Diabetes Neg Hx     Objective: Office vital signs reviewed. BP 136/70   Pulse 80   Temp 97.9 F (36.6 C)   Ht 6' (1.829 m)   Wt 176 lb (79.8 kg)   SpO2 98%   BMI 23.87 kg/m   Physical Examination:  General: Awake, alert, nontoxic-appearing, No acute distress HEENT: No nystagmus Cardio: regular rate and rhythm, S1S2 heard, no murmurs appreciated Pulm: clear to auscultation bilaterally, no wheezes, rhonchi or rales; normal work of breathing on room air MSK: Ambulating independently with hunched station but independent in gait  Assessment/ Plan: 89 y.o. male   Chronic atrial fibrillation (HCC) - Plan: CoaguChek XS/INR Waived  Anticoagulation monitoring, INR range 2-3 - Plan: CoaguChek XS/INR Waived  Rate and rhythm controlled.  INR therapeutic at 2.0 today.  Continue half tablet daily except for 1 tablet on Wednesdays and Fridays.  8-week follow-up scheduled.  Keep MRI appointment for brain eval   Dalton Vaughan Hulen Skains, DO [facility] [phone]

## 2024-01-08 ENCOUNTER — Other Ambulatory Visit: Payer: Self-pay

## 2024-01-08 DIAGNOSIS — D696 Thrombocytopenia, unspecified: Secondary | ICD-10-CM

## 2024-01-09 ENCOUNTER — Inpatient Hospital Stay: Payer: Medicare Other | Attending: Hematology

## 2024-01-09 DIAGNOSIS — I4891 Unspecified atrial fibrillation: Secondary | ICD-10-CM | POA: Diagnosis not present

## 2024-01-09 DIAGNOSIS — Z7901 Long term (current) use of anticoagulants: Secondary | ICD-10-CM | POA: Insufficient documentation

## 2024-01-09 DIAGNOSIS — D696 Thrombocytopenia, unspecified: Secondary | ICD-10-CM | POA: Diagnosis not present

## 2024-01-09 LAB — CBC WITH DIFFERENTIAL/PLATELET
Abs Immature Granulocytes: 0.01 10*3/uL (ref 0.00–0.07)
Basophils Absolute: 0 10*3/uL (ref 0.0–0.1)
Basophils Relative: 0 %
Eosinophils Absolute: 0.1 10*3/uL (ref 0.0–0.5)
Eosinophils Relative: 2 %
HCT: 41.2 % (ref 39.0–52.0)
Hemoglobin: 13.7 g/dL (ref 13.0–17.0)
Immature Granulocytes: 0 %
Lymphocytes Relative: 37 %
Lymphs Abs: 2.2 10*3/uL (ref 0.7–4.0)
MCH: 31.1 pg (ref 26.0–34.0)
MCHC: 33.3 g/dL (ref 30.0–36.0)
MCV: 93.4 fL (ref 80.0–100.0)
Monocytes Absolute: 0.5 10*3/uL (ref 0.1–1.0)
Monocytes Relative: 9 %
Neutro Abs: 3 10*3/uL (ref 1.7–7.7)
Neutrophils Relative %: 52 %
Platelets: 137 10*3/uL — ABNORMAL LOW (ref 150–400)
RBC: 4.41 MIL/uL (ref 4.22–5.81)
RDW: 14.7 % (ref 11.5–15.5)
WBC: 5.8 10*3/uL (ref 4.0–10.5)
nRBC: 0 % (ref 0.0–0.2)

## 2024-01-15 DIAGNOSIS — I495 Sick sinus syndrome: Secondary | ICD-10-CM | POA: Diagnosis not present

## 2024-01-16 ENCOUNTER — Inpatient Hospital Stay: Payer: Medicare Other | Admitting: Oncology

## 2024-01-16 DIAGNOSIS — D696 Thrombocytopenia, unspecified: Secondary | ICD-10-CM

## 2024-01-16 DIAGNOSIS — I4891 Unspecified atrial fibrillation: Secondary | ICD-10-CM | POA: Diagnosis not present

## 2024-01-16 DIAGNOSIS — Z7901 Long term (current) use of anticoagulants: Secondary | ICD-10-CM | POA: Diagnosis not present

## 2024-01-16 NOTE — Progress Notes (Signed)
[facility] [address], [place] [address]   Clinic Day:  01/16/2024  Referring physician: Raliegh Ip, DO  Patient Care Team: Raliegh Ip, DO as PCP - General (Family Medicine) Heloise Purpura, MD as Consulting Physician (Urology) Pyrtle, Carie Caddy, MD as Consulting Physician (Gastroenterology) Ernesto Rutherford, MD as Consulting Physician (Ophthalmology) Dyane Dustman Renford Dills., MD as Referring Physician (Cardiology)   ASSESSMENT & PLAN:   Assessment:  1.  Mild intermittent thrombocytopenia: - Patient seen for intermittent mild thrombocytopenia since 2013. - CBC on 06/04/2023 PLT-125.  Normal Hb and WBC.  B12 and folate normal. - Denies any easy bruising.  Denies any nosebleeds. - He is on Coumadin for 20 years for chronic A-fib.  He reportedly had dizzy spells with Eliquis and could not tolerate it when they tried to switch. - CTA PE (11/05/2021): Spleen size normal.  No B symptoms.  2.  Social/family history: - Lives with son and grandson at home.  Independent of ADLs and IADLs.  He served in the [employer] for 27 years and participated in 2 tours in [place].  Exposure to agent orange present.  Quit smoking in 1968.  Worked at [employer] after [employer]. - Half brother had multiple myeloma.  Maternal uncle had lung cancer.  Plan:  1.  Mild intermittent thrombocytopenia: - Platelet count has been intermittently low beginning in 2022 ranging from 122 to normal. - Hematology workup from 06/24/2023 shows no evidence of nutritional deficiencies with normal MMA and copper.  Ferritin, iron saturations and folate are all within normal limits.  CBC shows normal hemoglobin with a platelet count of 149.  No evidence of infection with nonreactive or normal hep B and hep C lab results.  No evidence of connective tissue disorder with negative ANA, rheumatoid factor and LDH.  No evidence of underlying bone marrow infiltrative process with  unremarkable immunofixation and protein electrophoresis.  No evidence of monoclonal protein is present. -Etiology likely secondary to immune mediated thrombocytopenia.  -Repeat labs from 01/09/2024 show platelet count of 137, hemoglobin and white blood cell count.  Differential is also normal. -We discussed differential diagnosis of mild thrombocytopenia including immune mediated thrombocytopenia versus myelodysplastic syndrome.  Ideally one would need a bone marrow biopsy after age 55 to rule out component of MDS.  Since he has mild thrombocytopenia, may consider observation at this time.  If the platelet count drops < 100,000 consistently, would recommend bone marrow aspiration biopsy.   PLAN SUMMARY: >> Recommend follow-up in 6 months with lab work a couple days before.    I spent 25 minutes dedicated to the care of this patient (face-to-face and non-face-to-face) on the date of the encounter to include what is described in the assessment and plan.  No orders of the defined types were placed in this encounter.   Mauro Kaufmann, NP   1/30/202511:27 AM  CHIEF COMPLAINT/PURPOSE OF CONSULT:   Diagnosis: Mild thrombocytopenia   Cancer Staging  No matching staging information was found for the patient.    Prior Therapy: None  Current Therapy: Observation   HISTORY OF PRESENT ILLNESS:   Oncology History   No history exists.     Welcome is a 89 y.o. male presenting to clinic today for follow-up for low platelet counts.   He was last seen in clinic on 07/17/2023.  He is followed closely by his PCP Dr. Nadine Counts.  He was last seen on 01/07/2024 INR check for chronic atrial fibrillation.  INR stable at 2.0.  He continues half tablet daily except for 1 tablet on Wednesdays and Fridays.  Reports overall feeling pretty good.  Appetite and energy levels are 100%.  Has occasional dizzy spells and trouble when walking.  Has palpitations secondary to atrial fibrillation.  Has difficulty  starting his urine stream and sleep problems due to leg pain.  Reports he has an occasional nosebleed but has chronic sinus problems.  He is having an MRI of his brain on 01/29/2024 for dizzy spell and intermittent fatigue.  He had an MRI on 12/10/2023 for neck pain and dizziness which showed generalized cervical spine degeneration with foraminal impingement bilaterally at C3-4 to C5-6 and on the right at C6-7.  Up to mild spinal stenosis at C4-5.  PAST MEDICAL HISTORY:   Past Medical History: Past Medical History:  Diagnosis Date   Anal fissure    Arthritis    Atrial fibrillation (HCC)    BPH (benign prostatic hypertrophy)    CAD (coronary artery disease)    Dr. Donnie Aho   Cataract    COVID-19    Depression 2004   Diverticulosis    GERD (gastroesophageal reflux disease)    History of TIAs    Hyperlipidemia    Hypertension    Hypertensive heart disease without CHF    Hypothyroidism    Internal hemorrhoids    Lumbar disc disease    Oral bleeding 08/20/2012   Following molar tooth extraction July, 2013   Pneumonia    Ruptured disk 1995   Shingles    Thyroid disease    Vitamin D deficiency    Vitreous detachment     Surgical History: Past Surgical History:  Procedure Laterality Date   bilateral cataract surgery  6/07   CARDIAC CATHETERIZATION     CORONARY ARTERY BYPASS GRAFT  12/99    Dr. Tyrone Sage    cysloscopy  8/08   EYE SURGERY     HERNIA REPAIR  1997   right   herninated disc  1995   PACEMAKER INSERTION  11/03/2020   PROSTATE BIOPSY  8/08   TONSILLECTOMY AND ADENOIDECTOMY      Social History: Social History   Socioeconomic History   Marital status: Widowed    Spouse name: Harriett Sine    Number of children: 1   Years of education: 12   Highest education level: Not on file  Occupational History   Occupation: Retired    Veterinary surgeon: Company secretary x 27 years   Occupation: Retired    Associate Professor: Public librarian    Comment: supervisor x 17 years  Tobacco Use   Smoking  status: Former    Current packs/day: 0.00    Average packs/day: 1 pack/day for 18.0 years (18.0 ttl pk-yrs)    Types: Cigarettes    Start date: 12/17/1948    Quit date: 12/17/1966    Years since quitting: 57.1   Smokeless tobacco: Never  Vaping Use   Vaping status: Never Used  Substance and Sexual Activity   Alcohol use: Yes    Alcohol/week: 1.0 standard drink of alcohol    Types: 1 Standard drinks or equivalent per week    Comment: rare   Drug use: No   Sexual activity: Not on file  Other Topics Concern   Not on file  Social History Narrative   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, [facility]   Overall Financial Resource Strain (CARDIA)    Difficulty of Paying Living Expenses: Not very hard  Food Insecurity: No Food Insecurity (06/24/2023)   Hunger Vital Sign    Worried About Running Out of Food in the Last Year: Never true    Ran Out of Food in the Last Year: Never true  Transportation Needs: No Transportation Needs (06/24/2023)   PRAPARE - Administrator, Civil Service (Medical): No    Lack of Transportation (Non-Medical): No  Physical Activity: Sufficiently Active (03/05/2023)   Exercise Vital Sign    Days of Exercise per Week: 5 days    Minutes of Exercise per Session: 60 min  Stress: No Stress Concern Present (03/05/2023)   [employer] of Occupational Health - Occupational Stress Questionnaire    Feeling of Stress : Not at all  Social Connections: Socially Isolated (03/05/2023)   Social Connection and Isolation Panel [NHANES]    Frequency of Communication with Friends and Family: More than three times a week    Frequency of Social Gatherings with Friends and Family: More than three times a week    Attends Religious Services: Never     Database administrator or Organizations: No    Attends Banker Meetings: Never    Marital Status: Widowed  Intimate Partner Violence: Not At Risk (06/24/2023)   Humiliation, Afraid, Rape, and Kick questionnaire    Fear of Current or Ex-Partner: No    Emotionally Abused: No    Physically Abused: No    Sexually Abused: No    Family History: Family History  Problem Relation Age of Onset   Dementia Mother    Arthritis Mother    Cancer Brother        prostate   Asthma Sister    Heart disease Paternal Aunt    Heart disease Paternal Uncle    Hypertension Maternal Grandmother    CVA Maternal Grandmother    CVA Paternal Grandmother    Hepatitis C Son    Arthritis Son    Colon cancer Neg Hx    Colon polyps Neg Hx    Kidney disease Neg Hx    Esophageal cancer Neg Hx    Gallbladder disease Neg Hx    Diabetes Neg Hx     Current Medications:  Current Outpatient Medications:    amLODipine (NORVASC) 5 MG tablet, Take 1 tablet (5 mg total) by mouth daily., Disp: 100 tablet, Rfl: 3   azelastine (ASTELIN) 0.1 % nasal spray, Place 1 spray into both nostrils 2 (two) times daily., Disp: 90 mL, Rfl: 4   Calcium Carb-Cholecalciferol (CALCIUM + D3) 600-200 MG-UNIT TABS, Take 1 tablet by mouth daily. , Disp: , Rfl:    Cholecalciferol (VITAMIN D-3) 5000 UNITS TABS, Take 1 tablet by mouth daily., Disp: , Rfl:    doxazosin (CARDURA) 8 MG tablet, Take 1 tablet (8 mg total) by mouth at bedtime., Disp: 90 tablet, Rfl: 3   levothyroxine (SYNTHROID) 50 MCG tablet, Take 1 tablet (50 mcg total) by mouth daily before breakfast., Disp: 90 tablet, Rfl: 3   meclizine (ANTIVERT) 25 MG tablet, Take 25 mg by mouth daily as needed for dizziness., Disp: , Rfl:    mirtazapine (REMERON) 15 MG tablet, Take 1 tablet (15 mg total) by mouth at bedtime., Disp: 100 tablet,  Rfl: 3   Multiple Vitamin (MULTIVITAMIN) tablet, Take 1 tablet by mouth daily., Disp: , Rfl:    ramipril (ALTACE) 10 MG capsule, TAKE 1  CAPSULE DAILY, Disp: 90 capsule, Rfl: 0   rosuvastatin (CRESTOR) 5 MG tablet, Take 1 tablet by mouth Mondays, Wednesdays and Fridays, Disp: 36 tablet, Rfl: 3   SIMETHICONE PO, Take by mouth as needed., Disp: , Rfl:    triamterene-hydrochlorothiazide (MAXZIDE-25) 37.5-25 MG tablet, Take 1 tablet by mouth daily., Disp: 90 tablet, Rfl: 3   vitamin C (ASCORBIC ACID) 500 MG tablet, Take 500 mg by mouth daily. , Disp: , Rfl:    warfarin (COUMADIN) 5 MG tablet, TAKE 1 TABLET DAILY AS DIRECTED, Disp: 90 tablet, Rfl: 3   Allergies: Allergies  Allergen Reactions   Paxil [Paroxetine Hcl] Palpitations   Codeine Other (See Comments)   Eliquis [Apixaban] Other (See Comments)    dizziness   Penicillins Other (See Comments)    Did it involve swelling of the face/tongue/throat, SOB, or low BP? Unknown Did it involve sudden or severe rash/hives, skin peeling, or any reaction on the inside of your mouth or nose? Unknown Did you need to seek medical attention at a hospital or doctor's office? Unknown When did it last happen?      unk If all above answers are "NO", may proceed with cephalosporin use.    Pravachol [Pravastatin Sodium] Other (See Comments)    myalgias   Zetia [Ezetimibe] Other (See Comments)    myalgias   Vytorin [Ezetimibe-Simvastatin] Other (See Comments)    myalgias    REVIEW OF SYSTEMS:   Review of Systems  Constitutional:  Positive for fatigue.  Cardiovascular:  Positive for palpitations.  Genitourinary:  Positive for difficulty urinating.   Neurological:  Positive for dizziness and numbness.  Psychiatric/Behavioral:  Positive for sleep disturbance.      VITALS:   Blood pressure (!) 158/86, pulse 84, temperature 97.7 F (36.5 C), temperature source Oral, resp. rate 18, weight 175 lb 7.8 oz (79.6 kg), SpO2 98%.  Wt Readings from Last 3 Encounters:  01/16/24 175 lb 7.8 oz (79.6 kg)  01/07/24 176 lb (79.8 kg)  12/25/23 176 lb (79.8 kg)    Body mass index is 23.8  kg/m.  Performance status (ECOG): 1 - Symptomatic but completely ambulatory  PHYSICAL EXAM:   Physical Exam Constitutional:      Appearance: Normal appearance.  Cardiovascular:     Rate and Rhythm: Normal rate and regular rhythm.  Pulmonary:     Effort: Pulmonary effort is normal.     Breath sounds: Normal breath sounds.  Abdominal:     General: Bowel sounds are normal.     Palpations: Abdomen is soft.  Musculoskeletal:        General: No swelling. Normal range of motion.  Neurological:     Mental Status: He is alert and oriented to person, place, and time. Mental status is at baseline.     LABS:      Latest Ref Rng & Units 01/09/2024   10:38 AM 06/24/2023   12:02 PM 06/04/2023   11:51 AM  CBC  WBC 4.0 - 10.5 K/uL 5.8  6.2  5.4   Hemoglobin 13.0 - 17.0 g/dL 03.4  74.2  59.5   Hematocrit 39.0 - 52.0 % 41.2  43.4  40.9   Platelets 150 - 400 K/uL 137  149  125       Latest Ref Rng & Units 01/01/2023   10:10 AM 12/20/2021  10:00 AM 11/08/2021   10:27 AM  CMP  Glucose 70 - 99 mg/dL 79  86  90   BUN 10 - 36 mg/dL 12  13  8    Creatinine 0.76 - 1.27 mg/dL 1.61  0.96  0.45   Sodium 134 - 144 mmol/L 141  139  136   Potassium 3.5 - 5.2 mmol/L 4.0  3.5  3.5   Chloride 96 - 106 mmol/L 101  100  99   CO2 20 - 29 mmol/L 27  28  31    Calcium 8.6 - 10.2 mg/dL 9.5  9.5  9.4   Total Protein 6.0 - 8.5 g/dL 6.5  6.4    Total Bilirubin 0.0 - 1.2 mg/dL 0.5  0.7    Alkaline Phos 44 - 121 IU/L 125  137    AST 0 - 40 IU/L 17  30    ALT 0 - 44 IU/L 13  25       No results found for: "CEA1", "CEA" / No results found for: "CEA1", "CEA" Lab Results  Component Value Date   PSA1 32.4 (H) 02/22/2021   No results found for: "CAN199" No results found for: "CAN125"  Lab Results  Component Value Date   TOTALPROTELP 7.4 06/24/2023   TOTALPROTELP 6.6 06/24/2023   ALBUMINELP 4.3 06/24/2023   A1GS 0.3 06/24/2023   A2GS 0.7 06/24/2023   BETS 1.1 06/24/2023   GAMS 1.0 06/24/2023   MSPIKE  Not Observed 06/24/2023   SPEI Comment 06/24/2023   Lab Results  Component Value Date   TIBC 302 06/04/2023   FERRITIN 136 06/04/2023   FERRITIN 726 (H) 09/16/2019   FERRITIN 855 (H) 09/15/2019   IRONPCTSAT 30 06/04/2023   Lab Results  Component Value Date   LDH 129 06/24/2023     STUDIES:   No results found.

## 2024-01-20 ENCOUNTER — Telehealth: Payer: Self-pay | Admitting: Family Medicine

## 2024-01-20 NOTE — Telephone Encounter (Signed)
Copied from CRM [id number]. Topic: General - Other >> Jan 20, 2024  2:34 PM Geneva B wrote: Reason for CRM: patient is calling in to find out if [place] neruo every gave gottaschalk information on if he need to get the mri please call patient [phone]

## 2024-01-22 ENCOUNTER — Emergency Department (HOSPITAL_COMMUNITY): Payer: Medicare Other

## 2024-01-22 ENCOUNTER — Other Ambulatory Visit (HOSPITAL_COMMUNITY): Payer: TRICARE For Life (TFL)

## 2024-01-22 ENCOUNTER — Other Ambulatory Visit: Payer: Self-pay

## 2024-01-22 ENCOUNTER — Encounter (HOSPITAL_COMMUNITY): Payer: Self-pay

## 2024-01-22 ENCOUNTER — Inpatient Hospital Stay (HOSPITAL_COMMUNITY)
Admission: EM | Admit: 2024-01-22 | Discharge: 2024-01-28 | DRG: 281 | Disposition: A | Discharge status: Home / Self Care | Payer: Medicare Other | Attending: Student | Admitting: Student

## 2024-01-22 DIAGNOSIS — Z823 Family history of stroke: Secondary | ICD-10-CM

## 2024-01-22 DIAGNOSIS — Z7901 Long term (current) use of anticoagulants: Secondary | ICD-10-CM | POA: Diagnosis not present

## 2024-01-22 DIAGNOSIS — I69320 Aphasia following cerebral infarction: Secondary | ICD-10-CM

## 2024-01-22 DIAGNOSIS — R9082 White matter disease, unspecified: Secondary | ICD-10-CM | POA: Diagnosis not present

## 2024-01-22 DIAGNOSIS — N400 Enlarged prostate without lower urinary tract symptoms: Secondary | ICD-10-CM | POA: Diagnosis present

## 2024-01-22 DIAGNOSIS — D696 Thrombocytopenia, unspecified: Secondary | ICD-10-CM | POA: Diagnosis not present

## 2024-01-22 DIAGNOSIS — R9431 Abnormal electrocardiogram [ECG] [EKG]: Secondary | ICD-10-CM | POA: Diagnosis not present

## 2024-01-22 DIAGNOSIS — N401 Enlarged prostate with lower urinary tract symptoms: Secondary | ICD-10-CM | POA: Diagnosis not present

## 2024-01-22 DIAGNOSIS — I214 Non-ST elevation (NSTEMI) myocardial infarction: Secondary | ICD-10-CM | POA: Diagnosis not present

## 2024-01-22 DIAGNOSIS — I119 Hypertensive heart disease without heart failure: Secondary | ICD-10-CM | POA: Diagnosis not present

## 2024-01-22 DIAGNOSIS — Z79899 Other long term (current) drug therapy: Secondary | ICD-10-CM | POA: Diagnosis not present

## 2024-01-22 DIAGNOSIS — Z87891 Personal history of nicotine dependence: Secondary | ICD-10-CM

## 2024-01-22 DIAGNOSIS — I495 Sick sinus syndrome: Secondary | ICD-10-CM | POA: Diagnosis present

## 2024-01-22 DIAGNOSIS — I2510 Atherosclerotic heart disease of native coronary artery without angina pectoris: Secondary | ICD-10-CM | POA: Diagnosis present

## 2024-01-22 DIAGNOSIS — Z880 Allergy status to penicillin: Secondary | ICD-10-CM

## 2024-01-22 DIAGNOSIS — R079 Chest pain, unspecified: Secondary | ICD-10-CM | POA: Diagnosis not present

## 2024-01-22 DIAGNOSIS — Z8261 Family history of arthritis: Secondary | ICD-10-CM

## 2024-01-22 DIAGNOSIS — Z8249 Family history of ischemic heart disease and other diseases of the circulatory system: Secondary | ICD-10-CM

## 2024-01-22 DIAGNOSIS — E039 Hypothyroidism, unspecified: Secondary | ICD-10-CM | POA: Insufficient documentation

## 2024-01-22 DIAGNOSIS — G8929 Other chronic pain: Secondary | ICD-10-CM | POA: Diagnosis present

## 2024-01-22 DIAGNOSIS — Z7982 Long term (current) use of aspirin: Secondary | ICD-10-CM | POA: Diagnosis not present

## 2024-01-22 DIAGNOSIS — Z79890 Hormone replacement therapy (postmenopausal): Secondary | ICD-10-CM | POA: Diagnosis not present

## 2024-01-22 DIAGNOSIS — M47812 Spondylosis without myelopathy or radiculopathy, cervical region: Secondary | ICD-10-CM | POA: Diagnosis not present

## 2024-01-22 DIAGNOSIS — I493 Ventricular premature depolarization: Secondary | ICD-10-CM | POA: Diagnosis present

## 2024-01-22 DIAGNOSIS — E782 Mixed hyperlipidemia: Secondary | ICD-10-CM | POA: Diagnosis not present

## 2024-01-22 DIAGNOSIS — K219 Gastro-esophageal reflux disease without esophagitis: Secondary | ICD-10-CM | POA: Diagnosis not present

## 2024-01-22 DIAGNOSIS — M25511 Pain in right shoulder: Secondary | ICD-10-CM | POA: Diagnosis not present

## 2024-01-22 DIAGNOSIS — R200 Anesthesia of skin: Secondary | ICD-10-CM | POA: Diagnosis not present

## 2024-01-22 DIAGNOSIS — I10 Essential (primary) hypertension: Secondary | ICD-10-CM | POA: Diagnosis not present

## 2024-01-22 DIAGNOSIS — I4821 Permanent atrial fibrillation: Secondary | ICD-10-CM | POA: Diagnosis present

## 2024-01-22 DIAGNOSIS — Z950 Presence of cardiac pacemaker: Secondary | ICD-10-CM

## 2024-01-22 DIAGNOSIS — I25810 Atherosclerosis of coronary artery bypass graft(s) without angina pectoris: Secondary | ICD-10-CM | POA: Diagnosis present

## 2024-01-22 DIAGNOSIS — R918 Other nonspecific abnormal finding of lung field: Secondary | ICD-10-CM | POA: Diagnosis not present

## 2024-01-22 DIAGNOSIS — I482 Chronic atrial fibrillation, unspecified: Secondary | ICD-10-CM | POA: Diagnosis not present

## 2024-01-22 DIAGNOSIS — M25512 Pain in left shoulder: Secondary | ICD-10-CM | POA: Diagnosis present

## 2024-01-22 DIAGNOSIS — R351 Nocturia: Secondary | ICD-10-CM | POA: Diagnosis not present

## 2024-01-22 DIAGNOSIS — I252 Old myocardial infarction: Secondary | ICD-10-CM

## 2024-01-22 DIAGNOSIS — Z825 Family history of asthma and other chronic lower respiratory diseases: Secondary | ICD-10-CM

## 2024-01-22 DIAGNOSIS — M4802 Spinal stenosis, cervical region: Secondary | ICD-10-CM | POA: Diagnosis present

## 2024-01-22 DIAGNOSIS — R42 Dizziness and giddiness: Secondary | ICD-10-CM | POA: Diagnosis not present

## 2024-01-22 DIAGNOSIS — E876 Hypokalemia: Secondary | ICD-10-CM | POA: Diagnosis not present

## 2024-01-22 HISTORY — DX: Non-ST elevation (NSTEMI) myocardial infarction: I21.4

## 2024-01-22 LAB — BASIC METABOLIC PANEL
Anion gap: 11 (ref 5–15)
BUN: 8 mg/dL (ref 8–23)
CO2: 26 mmol/L (ref 22–32)
Calcium: 9.2 mg/dL (ref 8.9–10.3)
Chloride: 97 mmol/L — ABNORMAL LOW (ref 98–111)
Creatinine, Ser: 0.79 mg/dL (ref 0.61–1.24)
GFR, Estimated: >60 mL/min (ref >60)
Glucose, Bld: 112 mg/dL — ABNORMAL HIGH (ref 70–99)
Potassium: 3.8 mmol/L (ref 3.5–5.1)
Sodium: 134 mmol/L — ABNORMAL LOW (ref 135–145)

## 2024-01-22 LAB — TROPONIN I (HIGH SENSITIVITY)
Troponin I (High Sensitivity): 808 ng/L (ref <18)
Troponin I (High Sensitivity): 876 ng/L (ref <18)
Troponin I (High Sensitivity): 1368 ng/L (ref <18)

## 2024-01-22 LAB — CBC
HCT: 40.4 % (ref 39.0–52.0)
Hemoglobin: 13.5 g/dL (ref 13.0–17.0)
MCH: 31 pg (ref 26.0–34.0)
MCHC: 33.4 g/dL (ref 30.0–36.0)
MCV: 92.7 fL (ref 80.0–100.0)
Platelets: 134 10*3/uL — ABNORMAL LOW (ref 150–400)
RBC: 4.36 MIL/uL (ref 4.22–5.81)
RDW: 14.6 % (ref 11.5–15.5)
WBC: 7.7 10*3/uL (ref 4.0–10.5)
nRBC: 0 % (ref 0.0–0.2)

## 2024-01-22 LAB — PROTIME-INR
INR: 2 — ABNORMAL HIGH (ref 0.8–1.2)
Prothrombin Time: 22.8 s — ABNORMAL HIGH (ref 11.4–15.2)

## 2024-01-22 LAB — MAGNESIUM: Magnesium: 2.2 mg/dL (ref 1.7–2.4)

## 2024-01-22 MED ORDER — HEPARIN (PORCINE) 25000 UT/250ML-% IV SOLN
1150.0000 [IU]/h | INTRAVENOUS | Status: DC
  Administered 2024-01-22: 950 [IU]/h via INTRAVENOUS
  Administered 2024-01-23 – 2024-01-24 (×2): 1100 [IU]/h via INTRAVENOUS
  Administered 2024-01-25 – 2024-01-27 (×3): 1150 [IU]/h via INTRAVENOUS
  Filled 2024-01-22 (×6): qty 250

## 2024-01-22 MED ORDER — DOXAZOSIN MESYLATE 8 MG PO TABS
8.0000 mg | ORAL_TABLET | Freq: Every day | ORAL | Status: DC
  Administered 2024-01-22 – 2024-01-27 (×6): 8 mg via ORAL
  Filled 2024-01-22 (×6): qty 1
  Filled 2024-01-22: qty 4
  Filled 2024-01-22 (×2): qty 1

## 2024-01-22 MED ORDER — LEVOTHYROXINE SODIUM 50 MCG PO TABS: 50.0000 ug | ORAL_TABLET | Freq: Every day | ORAL | Status: DC

## 2024-01-22 MED ORDER — ACETAMINOPHEN 650 MG RE SUPP: 650.0000 mg | Freq: Four times a day (QID) | RECTAL | Status: DC | PRN

## 2024-01-22 MED ORDER — ACETAMINOPHEN 325 MG PO TABS: 650.0000 mg | ORAL_TABLET | Freq: Four times a day (QID) | ORAL | Status: DC | PRN

## 2024-01-22 MED ORDER — AMLODIPINE BESYLATE 5 MG PO TABS
5.0000 mg | ORAL_TABLET | Freq: Every day | ORAL | Status: DC
  Administered 2024-01-23 – 2024-01-28 (×6): 5 mg via ORAL
  Filled 2024-01-22 (×6): qty 1

## 2024-01-22 MED ORDER — RAMIPRIL 10 MG PO CAPS
10.0000 mg | ORAL_CAPSULE | Freq: Every day | ORAL | Status: DC
  Filled 2024-01-22 (×2): qty 1

## 2024-01-22 MED ORDER — LEVOTHYROXINE SODIUM 50 MCG PO TABS
50.0000 ug | ORAL_TABLET | Freq: Every day | ORAL | Status: DC
  Administered 2024-01-23 – 2024-01-28 (×6): 50 ug via ORAL
  Filled 2024-01-22 (×7): qty 1

## 2024-01-22 MED ORDER — PROCHLORPERAZINE EDISYLATE 10 MG/2ML IJ SOLN: 10.0000 mg | Freq: Four times a day (QID) | INTRAMUSCULAR | Status: DC | PRN

## 2024-01-22 MED ORDER — ROSUVASTATIN CALCIUM 5 MG PO TABS
5.0000 mg | ORAL_TABLET | ORAL | Status: DC
  Administered 2024-01-24: 5 mg via ORAL
  Filled 2024-01-22: qty 1

## 2024-01-22 NOTE — ED Triage Notes (Signed)
 Pt reports having a future MRI scheduled for next Wednesday.

## 2024-01-22 NOTE — Progress Notes (Signed)
 PHARMACY - ANTICOAGULATION CONSULT NOTE  Pharmacy Consult for heparin  Indication: chest pain/ACS  Allergies  Allergen Reactions   Paxil [Paroxetine Hcl] Palpitations   Codeine Other (See Comments)   Eliquis  [Apixaban ] Other (See Comments)    dizziness   Penicillins Other (See Comments)    Unknown     Pravachol [Pravastatin Sodium] Other (See Comments)    myalgias   Zetia  [Ezetimibe ] Other (See Comments)    myalgias   Vytorin [Ezetimibe -Simvastatin] Other (See Comments)    myalgias   Patient Measurements: Height: 6' (182.9 cm) Weight: 79.6 kg (175 lb 7.8 oz) IBW/kg (Calculated) : 77.6 Heparin  Dosing Weight: 79.6 kg  Vital Signs: Temp: 98.7 F (37.1 C) (02/05 1816) Temp Source: Oral (02/05 1816) BP: 148/85 (02/05 1816) Pulse Rate: 67 (02/05 1816)  Labs: Recent Labs    01/22/24 1500 01/22/24 1527 01/22/24 1808  HGB  --  13.5  --   HCT  --  40.4  --   PLT  --  134*  --   LABPROT 22.8*  --   --   INR 2.0*  --   --   CREATININE  --  0.79  --   TROPONINIHS  --  808* 876*    Estimated Creatinine Clearance: 64.7 mL/min (by C-G formula based on SCr of 0.79 mg/dL).   Medical History: Past Medical History:  Diagnosis Date   Anal fissure    Arthritis    Atrial fibrillation (HCC)    BPH (benign prostatic hypertrophy)    CAD (coronary artery disease)    Dr. Blanca   Cataract    COVID-19    Depression 2004   Diverticulosis    GERD (gastroesophageal reflux disease)    History of TIAs    Hyperlipidemia    Hypertension    Hypertensive heart disease without CHF    Hypothyroidism    Internal hemorrhoids    Lumbar disc disease    Oral bleeding 08/20/2012   Following molar tooth extraction July, 2013   Pneumonia    Ruptured disk 1995   Shingles    Thyroid  disease    Vitamin D  deficiency    Vitreous detachment    Assessment: 89 y/o male presenting with numbness around lips and dizziness. PMH significant for sick sinus syndrome status post pacemaker, atrial  fibrillation on warfarin, and CAD status post CABG. In ED, troponin level found to be elevated. Pharmacy has been consulted to initiate heparin  infusion.  Home warfarin regimen: 5 mg daily on Wed & Fri, 2.5 mg all other days (Total weekly dose of 22.5 mg) Last dose: 2.5 mg on evening of 01/21/24  Baseline labs: INR 2.0, Hgb 13.5, plt 134  Goal of Therapy:  Heparin  level 0.3-0.7 units/ml Monitor platelets by anticoagulation protocol: Yes   Plan:  Will not give bolus due to recent use of warfarin As INR is at low end of goal range (2-3), will start heparin  infusion at 950 units/hour Check anti-Xa level in 8 hours and daily while on heparin  Continue to monitor H&H and platelets  Thank you for involving pharmacy in this patient's care.   Damien Napoleon, PharmD Clinical Pharmacist 01/22/2024 8:27 PM

## 2024-01-22 NOTE — ED Provider Notes (Signed)
 [place] [facility] AT [facility] Provider Note   CSN: [id number] Arrival date & time: 01/22/24  1419     History  Chief Complaint  Patient presents with   Dizziness    Dalton Vaughan is a 89 y.o. male.  89 year old male with a history of sick sinus syndrome status post pacemaker, atrial fibrillation on warfarin, and CAD status post CABG who presents to the emergency department with multiple complaints.  Appears that the patient's biggest complaint is some numbness around his lips.  Says that it started around noon.  Denies any numbness or weakness or vision changes or difficulty speaking.  Also has had multiple episodes where he says that his shoulders will hurt. No chest pain. Does not appear to be exertional and will typically last for several hours at a time.  He gets lightheaded with this.  Says that it has been more frequent this week as well.  Had MRA of the neck which did not show evidence of carotid disease.       Home Medications Prior to Admission medications   Medication Sig Start Date End Date Taking? Authorizing Provider  amLODipine  (NORVASC ) 5 MG tablet Take 1 tablet (5 mg total) by mouth daily. 09/10/23   Jolinda Norene HERO, DO  azelastine  (ASTELIN ) 0.1 % nasal spray Place 1 spray into both nostrils 2 (two) times daily. 02/27/23   Jolinda Norene HERO, DO  Calcium  Carb-Cholecalciferol (CALCIUM  + D3) 600-200 MG-UNIT TABS Take 1 tablet by mouth daily.     [provider]  Cholecalciferol (VITAMIN D -3) 5000 UNITS TABS Take 1 tablet by mouth daily.    [provider]  doxazosin  (CARDURA ) 8 MG tablet Take 1 tablet (8 mg total) by mouth at bedtime. 11/25/23   Jolinda Norene HERO, DO  levothyroxine  (SYNTHROID ) 50 MCG tablet Take 1 tablet (50 mcg total) by mouth daily before breakfast. 11/25/23   Jolinda Norene M, DO  meclizine (ANTIVERT) 25 MG tablet Take 25 mg by mouth daily as needed for dizziness.    [provider]   mirtazapine  (REMERON ) 15 MG tablet Take 1 tablet (15 mg total) by mouth at bedtime. 09/10/23   Jolinda Norene HERO, DO  Multiple Vitamin (MULTIVITAMIN) tablet Take 1 tablet by mouth daily.    [provider]  ramipril  (ALTACE ) 10 MG capsule TAKE 1 CAPSULE DAILY 11/07/23   Jolinda Norene M, DO  rosuvastatin  (CRESTOR ) 5 MG tablet Take 1 tablet by mouth Mondays, Wednesdays and Fridays 09/18/22   Jolinda Norene M, DO  SIMETHICONE  PO Take by mouth as needed.    [provider]  triamterene -hydrochlorothiazide  (MAXZIDE -25) 37.5-25 MG tablet Take 1 tablet by mouth daily. 12/13/23   Jolinda Norene HERO, DO  vitamin C (ASCORBIC ACID) 500 MG tablet Take 500 mg by mouth daily.     [provider]  warfarin (COUMADIN ) 5 MG tablet TAKE 1 TABLET DAILY AS DIRECTED 02/11/23   Jolinda Norene M, DO      Allergies    Paxil [paroxetine hcl], Codeine, Eliquis  [apixaban ], Penicillins, Pravachol [pravastatin sodium], Zetia  [ezetimibe ], and Vytorin [ezetimibe -simvastatin]    Review of Systems   Review of Systems  Physical Exam Updated Vital Signs BP (!) 148/85   Pulse 67   Temp 98.7 F (37.1 C) (Oral)   Resp 11   Ht 6' (1.829 m)   Wt 79.6 kg   SpO2 95%   BMI 23.80 kg/m  Physical Exam Vitals and nursing note reviewed.  Constitutional:  General: He is not in acute distress.    Appearance: He is well-developed.  HENT:     Head: Normocephalic and atraumatic.     Right Ear: External ear normal.     Left Ear: External ear normal.     Nose: Nose normal.  Eyes:     Extraocular Movements: Extraocular movements intact.     Conjunctiva/sclera: Conjunctivae normal.     Pupils: Pupils are equal, round, and reactive to light.  Cardiovascular:     Rate and Rhythm: Normal rate. Rhythm irregular.     Heart sounds: Normal heart sounds.     Comments: Pacemaker left chest wall.  Radial pulses 2+ bilaterally. Pulmonary:     Effort: Pulmonary effort is normal. No respiratory  distress.     Breath sounds: Normal breath sounds.  Musculoskeletal:     Cervical back: Normal range of motion and neck supple.     Right lower leg: No edema.     Left lower leg: No edema.     Comments: No tenderness to palpation of the shoulders.  Full range of motion of both shoulders.  Skin:    General: Skin is warm and dry.  Neurological:     Mental Status: He is alert.     Comments: NIHSS Exam  Level of Consciousness: Alert  LOC Questions: Answers Month and Age Correctly  LOC Commands: Opens and Closes Eyes and Hands on command  Best Gaze: Horizontal ocular movements intact  Visual Fields: No visual field loss  Facial Palsy: None  L Upper Extremity Motor: No drift after 10 seconds  R Upper Extremity Motor: No drift after 10 seconds  L Lower extremity Motor: No drift after 5 seconds  R Lower extremity Motor: No drift after 5 seconds  Ataxia: Absent  Sensory: Intact sensation to light touch on face, arms, trunk, and legs bilaterally  Best Language: No aphasia  Dysarthria: No dysarthria  Neglect: No visual or sensory neglect  Ambulates without assistance.  No ataxia.  Psychiatric:        Mood and Affect: Mood normal.        Behavior: Behavior normal.     ED Results / Procedures / Treatments   Labs (all labs ordered are listed, but only abnormal results are displayed) Labs Reviewed  BASIC METABOLIC PANEL - Abnormal; Notable for the following components:      Result Value   Sodium 134 (*)    Chloride 97 (*)    Glucose, Bld 112 (*)    All other components within normal limits  CBC - Abnormal; Notable for the following components:   Platelets 134 (*)    All other components within normal limits  PROTIME-INR - Abnormal; Notable for the following components:   Prothrombin Time 22.8 (*)    INR 2.0 (*)    All other components within normal limits  TROPONIN I (HIGH SENSITIVITY) - Abnormal; Notable for the following components:   Troponin I (High Sensitivity) 808 (*)     All other components within normal limits  TROPONIN I (HIGH SENSITIVITY) - Abnormal; Notable for the following components:   Troponin I (High Sensitivity) 876 (*)    All other components within normal limits  MAGNESIUM     EKG EKG Interpretation Date/Time:  Wednesday January 22 2024 14:44:28 EST Ventricular Rate:  97 PR Interval:    QRS Duration:  157 QT Interval:  411 QTC Calculation: 523 R Axis:   114  Text Interpretation: venticularly paced rhythm Anterior infarct, old  Prolonged QT interval Confirmed by Yolande Charleston [phone]) on 01/22/2024 4:40:13 PM  Radiology CT Head Wo Contrast Result Date: 01/22/2024 CLINICAL DATA:  Lip numbness. Dizziness and numbness in the lips which began yesterday afternoon. EXAM: CT HEAD WITHOUT CONTRAST TECHNIQUE: Contiguous axial images were obtained from the base of the skull through the vertex without intravenous contrast. RADIATION DOSE REDUCTION: This exam was performed according to the departmental dose-optimization program which includes automated exposure control, adjustment of the mA and/or kV according to patient size and/or use of iterative reconstruction technique. COMPARISON:  MR head without contrast 03/05/2017. FINDINGS: Brain: Mild atrophy and white matter changes are similar to the prior exam, likely within normal limits for age. A remote lacunar infarct is again noted within the left thalamus. No acute infarct, hemorrhage, or mass lesion is present. The ventricles are of normal size. No significant extraaxial fluid collection is present. The brainstem and cerebellum are within normal limits. Midline structures are within normal limits. Vascular: No hyperdense vessel or unexpected calcification. Skull: Calvarium is intact. No focal lytic or blastic lesions are present. No significant extracranial soft tissue lesion is present. Sinuses/Orbits: The paranasal sinuses and mastoid air cells are clear. Bilateral lens replacements are noted. Globes and  orbits are otherwise unremarkable. IMPRESSION: 1. No acute intracranial abnormality or significant interval change. 2. Stable atrophy and white matter disease, likely within normal limits for age. 3. Remote lacunar infarct of the left thalamus. Electronically Signed   By: Lonni Necessary M.D.   On: 01/22/2024 17:07   DG Chest 2 View Result Date: 01/22/2024 CLINICAL DATA:  Chest pain and dizziness today. History of atrial fibrillation, coronary artery disease, hypertension. EXAM: CHEST - 2 VIEW COMPARISON:  09/13/2023 FINDINGS: Postoperative changes in the mediastinum. Cardiac pacemaker. Cardiac enlargement. No vascular congestion, edema, or consolidation. Small left pleural effusion with mild basilar atelectasis. No pneumothorax. Mediastinal contours appear intact. Calcification of the aorta. Degenerative changes in the spine and shoulders. IMPRESSION: Cardiac enlargement. Small left pleural effusion with basilar atelectasis or infiltration. Electronically Signed   By: Elsie Gravely M.D.   On: 01/22/2024 17:06    Procedures Procedures    Medications Ordered in ED Medications - No data to display  ED Course/ Medical Decision Making/ A&P Clinical Course as of 01/22/24 1936  Wed Jan 22, 2024  1644 Device interrogation without evidence of arrhythmia. [RP]  1656 Signed out to Dr Suzette [RP]    Clinical Course User Index [RP] Yolande Charleston BROCKS, MD                                 Medical Decision Making Amount and/or Complexity of Data Reviewed Labs: ordered. Radiology: ordered.   Monica Zahler is a 89 y.o. male with comorbidities that complicate the patient evaluation including sick sinus syndrome status post pacemaker, atrial fibrillation on warfarin, and CAD status post CABG who presents to the emergency department with multiple complaints.    Initial Ddx:  Stroke, vertigo, vertebrobasilar insufficiency, angina/MI, dissection, MSK pain  MDM/Course:  Patient presents to the  emergency department with multiple complaints.  His big complaint today is numbness around his lips.  Did consider stroke but his NIHSS at this time is 0.  Would be somewhat atypical for stroke presentation.  He also has a pacemaker which will make diagnosis more difficult.  I am slightly more concerned about his bilateral shoulder pain and dizziness that he has been having.  This does not appear to be traumatic.  It is not exertional but he does have multiple cardiac risk factors.  He had an EKG which shows a ventricularly paced rhythm but does not meet Sgarbossa's criteria for STEMI.  Troponin returned and was elevated at 808.  Not actively having any chest pain.  Signed out to the oncoming physician awaiting CT head to ensure that he does not have a bleed before he started on heparin  and aspirin .  They will discuss his case with cardiology.  He had a pacemaker interrogation as well with lightheadedness which did not show any concerning events.    This patient presents to the ED for concern of complaints listed in HPI, this involves an extensive number of treatment options, and is a complaint that carries with it a high risk of complications and morbidity. Disposition including potential need for admission considered.   Dispo: Admit  Additional history obtained from son Records reviewed Outpatient Clinic Notes The following labs were independently interpreted: Chemistry and show no acute abnormality I independently reviewed the following imaging with scope of interpretation limited to determining acute life threatening conditions related to emergency care: Chest x-Aadam and agree with the radiologist interpretation with the following exceptions: none I personally reviewed and interpreted cardiac monitoring: normal sinus rhythm  I personally reviewed and interpreted the pt's EKG: see above for interpretation  I have reviewed the patients home medications and made adjustments as needed Social  Determinants of health:  Elderly  Portions of this note were generated with Scientist, clinical (histocompatibility and immunogenetics). Dictation errors may occur despite best attempts at proofreading.    CRITICAL CARE Performed by: Lamar JAYSON Shan   Total critical care time: 30 minutes  Critical care time was exclusive of separately billable procedures and treating other patients.  Critical care was necessary to treat or prevent imminent or life-threatening deterioration.  Critical care was time spent personally by me on the following activities: development of treatment plan with patient and/or surrogate as well as nursing, discussions with consultants, evaluation of patient's response to treatment, examination of patient, obtaining history from patient or surrogate, ordering and performing treatments and interventions, ordering and review of laboratory studies, ordering and review of radiographic studies, pulse oximetry and re-evaluation of patient's condition.   Final Clinical Impression(s) / ED Diagnoses Final diagnoses:  NSTEMI (non-ST elevated myocardial infarction) (HCC)  Lip numbness  Acute pain of both shoulders    Rx / DC Orders ED Discharge Orders     None         Shan Lamar JAYSON, MD 01/22/24 [phone]

## 2024-01-22 NOTE — H&P (Signed)
 History and Physical    Patient: Dalton Vaughan FMW:[id number] DOB: [birth year] DOA: 01/22/2024 DOS: the patient was seen and examined on 01/22/2024 PCP: Jolinda Norene HERO, DO  Patient coming from: Home  Chief Complaint:  Chief Complaint  Patient presents with   Dizziness   HPI: Dalton Vaughan is a 89 y.o. male with medical history significant of chronic atrial fibrillation on warfarin, sick sinus syndrome status post pacemaker, CAD s/p CABG who presents to the emergency department due to numbness sensation around his lips that started at noon today (of note, patient states that he has been having this sensation on and off for about 5 to 6 years) and also complains of bilateral shoulder pain which was also chronic, but worsened about 4 days ago and last for several hours at a time and is associated with lightheadedness.  He denies chest pain, shortness of breath, difficulty speaking, vision changes or weakness.  MR angiography of neck done in September 2024 showed normal MR angiography of the neck vessels.  Lip numbness already resolved by the time patient was seen at bedside.  ED Course:  In the emergency department, BP was 152/71, other vital signs are within normal range.  Workup in the ED showed normal CBC except for platelets of 134, BMP was normal except for sodium of 134, chloride 97, glucose 112.  Troponin 808 > 876. CT head without contrast showed no acute intracranial abnormality or significant interval change Chest x-Ott showed cardiac enlargement.  Small left pleural effusion with basilar atelectasis or infiltration MRI of brain without contrast was ordered in the ED.  Cardiologist (Dr. Krystal) was consulted and recommended that patient can be admitted to [facility] and to start heparin  drip with plan for cardiology to see patient in the morning per EDP.  Review of Systems: Review of systems as noted in the HPI. All other systems reviewed and are negative.   Past Medical History:  Diagnosis Date    Anal fissure    Arthritis    Atrial fibrillation (HCC)    BPH (benign prostatic hypertrophy)    CAD (coronary artery disease)    Dr. Blanca   Cataract    COVID-19    Depression 2004   Diverticulosis    GERD (gastroesophageal reflux disease)    History of TIAs    Hyperlipidemia    Hypertension    Hypertensive heart disease without CHF    Hypothyroidism    Internal hemorrhoids    Lumbar disc disease    Oral bleeding 08/20/2012   Following molar tooth extraction July, 2013   Pneumonia    Ruptured disk 1995   Shingles    Thyroid  disease    Vitamin D  deficiency    Vitreous detachment    Past Surgical History:  Procedure Laterality Date   bilateral cataract surgery  6/07   CARDIAC CATHETERIZATION     CORONARY ARTERY BYPASS GRAFT  12/99    Dr. Army    cysloscopy  8/08   EYE SURGERY     HERNIA REPAIR  1997   right   herninated disc  1995   PACEMAKER INSERTION  11/03/2020   PROSTATE BIOPSY  8/08   TONSILLECTOMY AND ADENOIDECTOMY      Social History:  reports that he quit smoking about 57 years ago. His smoking use included cigarettes. He started smoking about 75 years ago. He has a 18 pack-year smoking history. He has never used smokeless tobacco. He reports current alcohol use of about 1.0 standard drink of  alcohol per week. He reports that he does not use drugs.   Allergies  Allergen Reactions   Paxil [Paroxetine Hcl] Palpitations   Codeine Other (See Comments)   Eliquis  [Apixaban ] Other (See Comments)    dizziness   Pravachol [Pravastatin Sodium] Other (See Comments)    myalgias   Zetia  [Ezetimibe ] Other (See Comments)    myalgias   Penicillins Hives, Rash and Other (See Comments)    Happened in 1959   Vytorin [Ezetimibe -Simvastatin] Other (See Comments)    myalgias    Family History  Problem Relation Age of Onset   Dementia Mother    Arthritis Mother    Cancer Brother        prostate   Asthma Sister    Heart disease Paternal Aunt    Heart disease  Paternal Uncle    Hypertension Maternal Grandmother    CVA Maternal Grandmother    CVA Paternal Grandmother    Hepatitis C Son    Arthritis Son    Colon cancer Neg Hx    Colon polyps Neg Hx    Kidney disease Neg Hx    Esophageal cancer Neg Hx    Gallbladder disease Neg Hx    Diabetes Neg Hx      Prior to Admission medications   Medication Sig Start Date End Date Taking? Authorizing Provider  amLODipine  (NORVASC ) 5 MG tablet Take 1 tablet (5 mg total) by mouth daily. 09/10/23   Jolinda Norene HERO, DO  azelastine  (ASTELIN ) 0.1 % nasal spray Place 1 spray into both nostrils 2 (two) times daily. 02/27/23   Jolinda Norene HERO, DO  Calcium  Carb-Cholecalciferol (CALCIUM  + D3) 600-200 MG-UNIT TABS Take 1 tablet by mouth daily.     [provider]  Cholecalciferol (VITAMIN D -3) 5000 UNITS TABS Take 1 tablet by mouth daily.    [provider]  doxazosin  (CARDURA ) 8 MG tablet Take 1 tablet (8 mg total) by mouth at bedtime. 11/25/23   Jolinda Norene HERO, DO  levothyroxine  (SYNTHROID ) 50 MCG tablet Take 1 tablet (50 mcg total) by mouth daily before breakfast. 11/25/23   Jolinda Norene M, DO  meclizine (ANTIVERT) 25 MG tablet Take 25 mg by mouth daily as needed for dizziness.    [provider]  mirtazapine  (REMERON ) 15 MG tablet Take 1 tablet (15 mg total) by mouth at bedtime. 09/10/23   Jolinda Norene HERO, DO  Multiple Vitamin (MULTIVITAMIN) tablet Take 1 tablet by mouth daily.    [provider]  ramipril  (ALTACE ) 10 MG capsule TAKE 1 CAPSULE DAILY 11/07/23   Jolinda Norene M, DO  rosuvastatin  (CRESTOR ) 5 MG tablet Take 1 tablet by mouth Mondays, Wednesdays and Fridays 09/18/22   Jolinda Norene M, DO  SIMETHICONE  PO Take by mouth as needed.    [provider]  triamterene -hydrochlorothiazide  (MAXZIDE -25) 37.5-25 MG tablet Take 1 tablet by mouth daily. 12/13/23   Jolinda Norene HERO, DO  vitamin C (ASCORBIC ACID) 500 MG tablet Take 500 mg by mouth  daily.     [provider]  warfarin (COUMADIN ) 5 MG tablet TAKE 1 TABLET DAILY AS DIRECTED 02/11/23   Jolinda Norene HERO, DO    Physical Exam: BP (!) 150/88   Pulse 73   Temp 98.7 F (37.1 C) (Oral)   Resp 13   Ht 6' (1.829 m)   Wt 79.6 kg   SpO2 96%   BMI 23.80 kg/m   General: 89 y.o. year-old male well developed well nourished in no acute distress.  Alert and oriented x3. HEENT: NCAT, EOMI Neck: Supple, trachea medial Cardiovascular: Regular rate and rhythm with no rubs or gallops.  No thyromegaly or JVD noted.  No lower extremity edema. 2/4 pulses in all 4 extremities. Respiratory: Clear to auscultation with no wheezes or rales. Good inspiratory effort. Abdomen: Soft, nontender nondistended with normal bowel sounds x4 quadrants. Muskuloskeletal: No cyanosis, clubbing or edema noted bilaterally Neuro: Discomfort on palpation of C7-T1 and mild numbness of left fifth finger.  CN II-XII intact. Skin: No ulcerative lesions noted or rashes Psychiatry: Judgement and insight appear normal. Mood is appropriate for condition and setting          Labs on Admission:  Basic Metabolic Panel: Recent Labs  Lab 01/22/24 1527  NA 134*  K 3.8  CL 97*  CO2 26  GLUCOSE 112*  BUN 8  CREATININE 0.79  CALCIUM  9.2  MG 2.2   Liver Function Tests: No results for input(s): AST, ALT, ALKPHOS, BILITOT, PROT, ALBUMIN in the last 168 hours. No results for input(s): LIPASE, AMYLASE in the last 168 hours. No results for input(s): AMMONIA in the last 168 hours. CBC: Recent Labs  Lab 01/22/24 1527  WBC 7.7  HGB 13.5  HCT 40.4  MCV 92.7  PLT 134*   Cardiac Enzymes: No results for input(s): CKTOTAL, CKMB, CKMBINDEX, TROPONINI in the last 168 hours.  BNP (last 3 results) No results for input(s): BNP in the last 8760 hours.  ProBNP (last 3 results) No results for input(s): PROBNP in the last 8760 hours.  CBG: No results for input(s): GLUCAP in  the last 168 hours.  Radiological Exams on Admission: CT Head Wo Contrast Result Date: 01/22/2024 CLINICAL DATA:  Lip numbness. Dizziness and numbness in the lips which began yesterday afternoon. EXAM: CT HEAD WITHOUT CONTRAST TECHNIQUE: Contiguous axial images were obtained from the base of the skull through the vertex without intravenous contrast. RADIATION DOSE REDUCTION: This exam was performed according to the departmental dose-optimization program which includes automated exposure control, adjustment of the mA and/or kV according to patient size and/or use of iterative reconstruction technique. COMPARISON:  MR head without contrast 03/05/2017. FINDINGS: Brain: Mild atrophy and white matter changes are similar to the prior exam, likely within normal limits for age. A remote lacunar infarct is again noted within the left thalamus. No acute infarct, hemorrhage, or mass lesion is present. The ventricles are of normal size. No significant extraaxial fluid collection is present. The brainstem and cerebellum are within normal limits. Midline structures are within normal limits. Vascular: No hyperdense vessel or unexpected calcification. Skull: Calvarium is intact. No focal lytic or blastic lesions are present. No significant extracranial soft tissue lesion is present. Sinuses/Orbits: The paranasal sinuses and mastoid air cells are clear. Bilateral lens replacements are noted. Globes and orbits are otherwise unremarkable. IMPRESSION: 1. No acute intracranial abnormality or significant interval change. 2. Stable atrophy and white matter disease, likely within normal limits for age. 3. Remote lacunar infarct of the left thalamus. Electronically Signed   By: Lonni Necessary M.D.   On: 01/22/2024 17:07   DG Chest 2 View Result Date: 01/22/2024 CLINICAL DATA:  Chest pain and dizziness today. History of atrial fibrillation, coronary artery disease, hypertension. EXAM: CHEST - 2 VIEW COMPARISON:  09/13/2023  FINDINGS: Postoperative changes in the mediastinum. Cardiac pacemaker. Cardiac enlargement. No vascular congestion, edema, or consolidation. Small left pleural effusion with mild basilar atelectasis. No pneumothorax. Mediastinal contours appear intact. Calcification of the aorta. Degenerative changes in the  spine and shoulders. IMPRESSION: Cardiac enlargement. Small left pleural effusion with basilar atelectasis or infiltration. Electronically Signed   By: Elsie Gravely M.D.   On: 01/22/2024 17:06    EKG: I independently viewed the EKG done and my findings are as followed: Ventricular paced rhythm at a rate of 97 bpm with QTc of 523 ms  Assessment/Plan Present on Admission:  NSTEMI (non-ST elevated myocardial infarction) (HCC)  Lip numbness  Thrombocytopenia (HCC)  Essential hypertension  Benign prostatic hyperplasia  Chronic atrial fibrillation (HCC)  Cervical spondylosis without myelopathy  Mixed hyperlipidemia  Principal Problem:   NSTEMI (non-ST elevated myocardial infarction) (HCC) Active Problems:   Chronic atrial fibrillation (HCC)   Benign prostatic hyperplasia   Mixed hyperlipidemia   Lip numbness   Thrombocytopenia (HCC)   Essential hypertension   Cervical spondylosis without myelopathy   Shoulder pain, bilateral   Prolonged QT interval   Acquired hypothyroidism  NSTEMI Troponin 808 > 876; continue to trend troponin Continue telemetry  EKG showed ventricular paced rhythm at a rate of 97 bpm with QTc of 523 ms Cardiologist on-call (Dr. Krystal) was consulted and recommended starting patient on heparin  drip with plan for cardiology to see patient in the morning per EDP.  Lip numbness This appears to be a chronic on and off problem which already resolved at bedside CT head without contrast showed no acute intracranial abnormality or significant interval change MRI brain without contrast was ordered in the ED, but patient has a pacemaker which may not be compatible with  the MRI here at [facility].  Patient may have to go to [facility] for the MRI and come back or may have to follow-up with neurology as an outpatient. Continue telemetry  Bilateral shoulder pain This also appears to be chronic, though the pain worsened within the last few days MR angiography of neck done in September 2024 showed normal MR angiography of the neck vessels. Reviewing patient's charts, it appears that patient has a history of cervicalgia MR cervical spine without contrast done in December 2024 showed  Generalized cervical spine degeneration with foraminal impingement bilaterally at C3-4 to C5-6 and on the right at C6-7. Up to mild spinal stenosis at C4-5. Patient was following with [facility] and has a pending MRI brain without contrast on 01/29/2024 Continue Tylenol  as needed  Prolonged QT interval QTc 523 ms Patient is ventricular paced Avoid QT prolonging drugs Magnesium  level will be checked Repeat EKG in the morning  Chronic thrombocytopenia Platelets 134, stable. Continue to monitor platelet levels  Chronic atrial fibrillation Continue warfarin.  INR is 2.0, continue to monitor INR  Essential hypertension Continue amlodipine , ramipril   Mixed hyperlipidemia Continue Crestor   Acquired hypothyroidism Continue Synthroid   BPH Continue doxazosin    DVT prophylaxis: Heparin  drip  Code Status: Full code  Family Communication: None at bedside  Consults: Cardiology   Severity of Illness: The appropriate patient status for this patient is OBSERVATION. Observation status is judged to be reasonable and necessary in order to provide the required intensity of service to ensure the patient's safety. The patient's presenting symptoms, physical exam findings, and initial radiographic and laboratory data in the context of their medical condition is felt to place them at decreased risk for further clinical deterioration. Furthermore, it is anticipated that  the patient will be medically stable for discharge from the hospital within 2 midnights of admission.   Author: Alonie Gazzola, DO 01/22/2024 10:28 PM  For on call review [email or web address].

## 2024-01-22 NOTE — ED Triage Notes (Signed)
 Pt arrived via POV c/o dizziness, and numbness in his lips that began yesterday afternoon. Pt ambulatory in Triage, A+O X 4, but endorses not feeling well.

## 2024-01-22 NOTE — ED Provider Notes (Signed)
 Patient with elevated troponins at 808 and then 876.  Patient no longer having symptoms.  His CT of his head was negative.  I spoke with cardiology Dr. Krystal and she recommended putting the patient on heparin  and admitting him to [facility] with cardiology consulting tomorrow   Suzette Pac, MD 01/22/24 2015

## 2024-01-23 ENCOUNTER — Inpatient Hospital Stay (HOSPITAL_COMMUNITY): Payer: Medicare Other

## 2024-01-23 ENCOUNTER — Other Ambulatory Visit (HOSPITAL_COMMUNITY): Payer: Self-pay

## 2024-01-23 DIAGNOSIS — I214 Non-ST elevation (NSTEMI) myocardial infarction: Secondary | ICD-10-CM

## 2024-01-23 DIAGNOSIS — E876 Hypokalemia: Secondary | ICD-10-CM | POA: Diagnosis not present

## 2024-01-23 DIAGNOSIS — Z7901 Long term (current) use of anticoagulants: Secondary | ICD-10-CM | POA: Diagnosis not present

## 2024-01-23 DIAGNOSIS — I495 Sick sinus syndrome: Secondary | ICD-10-CM | POA: Diagnosis not present

## 2024-01-23 DIAGNOSIS — M25512 Pain in left shoulder: Secondary | ICD-10-CM | POA: Diagnosis present

## 2024-01-23 DIAGNOSIS — Z7982 Long term (current) use of aspirin: Secondary | ICD-10-CM | POA: Diagnosis not present

## 2024-01-23 DIAGNOSIS — I10 Essential (primary) hypertension: Secondary | ICD-10-CM | POA: Diagnosis not present

## 2024-01-23 DIAGNOSIS — I25810 Atherosclerosis of coronary artery bypass graft(s) without angina pectoris: Secondary | ICD-10-CM | POA: Diagnosis not present

## 2024-01-23 DIAGNOSIS — I2510 Atherosclerotic heart disease of native coronary artery without angina pectoris: Secondary | ICD-10-CM | POA: Diagnosis not present

## 2024-01-23 DIAGNOSIS — I493 Ventricular premature depolarization: Secondary | ICD-10-CM | POA: Diagnosis present

## 2024-01-23 DIAGNOSIS — N400 Enlarged prostate without lower urinary tract symptoms: Secondary | ICD-10-CM | POA: Diagnosis present

## 2024-01-23 DIAGNOSIS — K219 Gastro-esophageal reflux disease without esophagitis: Secondary | ICD-10-CM | POA: Diagnosis not present

## 2024-01-23 DIAGNOSIS — M25511 Pain in right shoulder: Secondary | ICD-10-CM | POA: Diagnosis present

## 2024-01-23 DIAGNOSIS — N401 Enlarged prostate with lower urinary tract symptoms: Secondary | ICD-10-CM | POA: Diagnosis not present

## 2024-01-23 DIAGNOSIS — Z79890 Hormone replacement therapy (postmenopausal): Secondary | ICD-10-CM | POA: Diagnosis not present

## 2024-01-23 DIAGNOSIS — Z79899 Other long term (current) drug therapy: Secondary | ICD-10-CM | POA: Diagnosis not present

## 2024-01-23 DIAGNOSIS — M47812 Spondylosis without myelopathy or radiculopathy, cervical region: Secondary | ICD-10-CM | POA: Diagnosis present

## 2024-01-23 DIAGNOSIS — I119 Hypertensive heart disease without heart failure: Secondary | ICD-10-CM | POA: Diagnosis not present

## 2024-01-23 DIAGNOSIS — I4821 Permanent atrial fibrillation: Secondary | ICD-10-CM | POA: Diagnosis not present

## 2024-01-23 DIAGNOSIS — M4802 Spinal stenosis, cervical region: Secondary | ICD-10-CM | POA: Diagnosis present

## 2024-01-23 DIAGNOSIS — I252 Old myocardial infarction: Secondary | ICD-10-CM | POA: Diagnosis not present

## 2024-01-23 DIAGNOSIS — R200 Anesthesia of skin: Secondary | ICD-10-CM | POA: Diagnosis not present

## 2024-01-23 DIAGNOSIS — R351 Nocturia: Secondary | ICD-10-CM | POA: Diagnosis not present

## 2024-01-23 DIAGNOSIS — E782 Mixed hyperlipidemia: Secondary | ICD-10-CM | POA: Diagnosis not present

## 2024-01-23 DIAGNOSIS — R9431 Abnormal electrocardiogram [ECG] [EKG]: Secondary | ICD-10-CM | POA: Diagnosis not present

## 2024-01-23 DIAGNOSIS — D696 Thrombocytopenia, unspecified: Secondary | ICD-10-CM | POA: Diagnosis not present

## 2024-01-23 DIAGNOSIS — I69320 Aphasia following cerebral infarction: Secondary | ICD-10-CM | POA: Diagnosis not present

## 2024-01-23 DIAGNOSIS — G8929 Other chronic pain: Secondary | ICD-10-CM | POA: Diagnosis not present

## 2024-01-23 DIAGNOSIS — I482 Chronic atrial fibrillation, unspecified: Secondary | ICD-10-CM | POA: Diagnosis not present

## 2024-01-23 DIAGNOSIS — E039 Hypothyroidism, unspecified: Secondary | ICD-10-CM | POA: Diagnosis not present

## 2024-01-23 LAB — CBC
HCT: 41.9 % (ref 39.0–52.0)
Hemoglobin: 13.6 g/dL (ref 13.0–17.0)
MCH: 31.1 pg (ref 26.0–34.0)
MCHC: 32.5 g/dL (ref 30.0–36.0)
MCV: 95.9 fL (ref 80.0–100.0)
Platelets: 94 K/uL — ABNORMAL LOW (ref 150–400)
RBC: 4.37 MIL/uL (ref 4.22–5.81)
RDW: 14.9 % (ref 11.5–15.5)
WBC: 6.1 K/uL (ref 4.0–10.5)
nRBC: 0 % (ref 0.0–0.2)

## 2024-01-23 LAB — HEPARIN LEVEL (UNFRACTIONATED)
Heparin Unfractionated: 0.18 [IU]/mL — ABNORMAL LOW (ref 0.30–0.70)
Heparin Unfractionated: 0.32 [IU]/mL (ref 0.30–0.70)
Heparin Unfractionated: 0.32 [IU]/mL (ref 0.30–0.70)

## 2024-01-23 LAB — COMPREHENSIVE METABOLIC PANEL
ALT: 12 U/L (ref 0–44)
AST: 27 U/L (ref 15–41)
Albumin: 3.9 g/dL (ref 3.5–5.0)
Alkaline Phosphatase: 81 U/L (ref 38–126)
Anion gap: 9 (ref 5–15)
BUN: 8 mg/dL (ref 8–23)
CO2: 26 mmol/L (ref 22–32)
Calcium: 8.9 mg/dL (ref 8.9–10.3)
Chloride: 101 mmol/L (ref 98–111)
Creatinine, Ser: 0.77 mg/dL (ref 0.61–1.24)
GFR, Estimated: >60 mL/min (ref >60)
Glucose, Bld: 91 mg/dL (ref 70–99)
Potassium: 3.6 mmol/L (ref 3.5–5.1)
Sodium: 136 mmol/L (ref 135–145)
Total Bilirubin: 1.1 mg/dL (ref 0.0–1.2)
Total Protein: 6.5 g/dL (ref 6.5–8.1)

## 2024-01-23 LAB — ECHOCARDIOGRAM COMPLETE
Area-P 1/2: 3.12 cm2
Height: 72 in
MV M vel: 4.31 m/s
MV Peak grad: 74.3 mm[Hg]
S' Lateral: 2.5 cm
Weight: 2807.78 [oz_av]

## 2024-01-23 LAB — PROTIME-INR
INR: 2.2 — ABNORMAL HIGH (ref 0.8–1.2)
INR: 2.1 — ABNORMAL HIGH (ref 0.8–1.2)
Prothrombin Time: 24.8 s — ABNORMAL HIGH (ref 11.4–15.2)
Prothrombin Time: 23.5 s — ABNORMAL HIGH (ref 11.4–15.2)

## 2024-01-23 LAB — MAGNESIUM: Magnesium: 2.3 mg/dL (ref 1.7–2.4)

## 2024-01-23 LAB — PHOSPHORUS: Phosphorus: 2.7 mg/dL (ref 2.5–4.6)

## 2024-01-23 LAB — TROPONIN I (HIGH SENSITIVITY): Troponin I (High Sensitivity): 1257 ng/L (ref <18)

## 2024-01-23 MED ORDER — RAMIPRIL 5 MG PO CAPS
10.0000 mg | ORAL_CAPSULE | Freq: Every day | ORAL | Status: DC
  Administered 2024-01-23 – 2024-01-28 (×6): 10 mg via ORAL
  Filled 2024-01-23 (×7): qty 2

## 2024-01-23 MED ORDER — ISOSORBIDE MONONITRATE ER 30 MG PO TB24
30.0000 mg | ORAL_TABLET | Freq: Every day | ORAL | Status: DC
  Administered 2024-01-23 – 2024-01-28 (×6): 30 mg via ORAL
  Filled 2024-01-23 (×6): qty 1

## 2024-01-23 MED ORDER — ASPIRIN 81 MG PO TBEC
81.0000 mg | DELAYED_RELEASE_TABLET | Freq: Every day | ORAL | Status: DC
  Administered 2024-01-23 – 2024-01-26 (×4): 81 mg via ORAL
  Filled 2024-01-23 (×4): qty 1

## 2024-01-23 NOTE — Progress Notes (Signed)
 PHARMACY - ANTICOAGULATION CONSULT NOTE  Pharmacy Consult for heparin  Indication: chest pain/ACS  Allergies  Allergen Reactions   Paxil [Paroxetine Hcl] Palpitations   Codeine Other (See Comments)   Eliquis  [Apixaban ] Other (See Comments)    dizziness   Pravachol [Pravastatin Sodium] Other (See Comments)    myalgias   Zetia  [Ezetimibe ] Other (See Comments)    myalgias   Penicillins Hives, Rash and Other (See Comments)    Happened in 1959   Vytorin [Ezetimibe -Simvastatin] Other (See Comments)    myalgias   Patient Measurements: Height: 6' (182.9 cm) Weight: 74.3 kg (163 lb 12.8 oz) IBW/kg (Calculated) : 77.6 Heparin  Dosing Weight: 79.6 kg  Vital Signs: Temp: 98.4 F (36.9 C) (02/06 2022) Temp Source: Oral (02/06 2022) BP: 124/68 (02/06 2022) Pulse Rate: 80 (02/06 1703)  Labs: Recent Labs    01/22/24 1500 01/22/24 1527 01/22/24 1527 01/22/24 1808 01/22/24 2224 01/23/24 0009 01/23/24 0507 01/23/24 0510 01/23/24 0608 01/23/24 1332 01/23/24 1936  HGB  --  13.5  --   --   --   --  13.6  --   --   --   --   HCT  --  40.4  --   --   --   --  41.9  --   --   --   --   PLT  --  134*  --   --   --   --  94*  --   --   --   --   LABPROT 22.8*  --   --   --   --   --   --  24.8*  --  23.5*  --   INR 2.0*  --   --   --   --   --   --  2.2*  --  2.1*  --   HEPARINUNFRC  --   --   --   --   --   --   --   --  0.18* 0.32 0.32  CREATININE  --  0.79  --   --   --   --  0.77  --   --   --   --   TROPONINIHS  --  808*   < > 876* 1,368* 1,257*  --   --   --   --   --    < > = values in this interval not displayed.    Estimated Creatinine Clearance: 61.9 mL/min (by C-G formula based on SCr of 0.77 mg/dL).   Medical History: Past Medical History:  Diagnosis Date   Anal fissure    Arthritis    Atrial fibrillation (HCC)    BPH (benign prostatic hypertrophy)    CAD (coronary artery disease)    Dr. Blanca   Cataract    COVID-19    Depression 2004   Diverticulosis    GERD  (gastroesophageal reflux disease)    History of TIAs    Hyperlipidemia    Hypertension    Hypertensive heart disease without CHF    Hypothyroidism    Internal hemorrhoids    Lumbar disc disease    Oral bleeding 08/20/2012   Following molar tooth extraction July, 2013   Pneumonia    Ruptured disk 1995   Shingles    Thyroid  disease    Vitamin D  deficiency    Vitreous detachment    Assessment: 89 y/o male presenting with numbness around lips and dizziness. PMH significant for sick sinus syndrome status  post pacemaker, atrial fibrillation on warfarin, and CAD status post CABG. In ED, troponin level found to be elevated. Pharmacy has been consulted to initiate heparin  infusion.  Home warfarin regimen: 5 mg daily on Wed & Fri, 2.5 mg all other days (Total weekly dose of 22.5 mg) Last dose: 2.5 mg on evening of 01/21/24  Heparin  level now at goal, INR still within range at 2.1 (warfarin continues to be held). CBC within normal limits. No bleeding or IV issues noted. Patient may need transferred to [facility] for cath.   PM: heparin  level 0.32 is therapeutic on heparin  1100 units/hr. No issues with the infusion or bleeding reported.  Goal of Therapy:  Heparin  level 0.3-0.7 units/ml Monitor platelets by anticoagulation protocol: Yes   Plan:  Continue Heparin  at 1100/hr Daily heparin  level, PT/INR and CBC  Dalton Vaughan, PharmD, BCPS Clinical Pharmacist 01/23/2024 8:38 PM  Please check AMION for all [facility] phone numbers After 10:00 PM, call Main Pharmacy [phone]

## 2024-01-23 NOTE — Plan of Care (Signed)
   Problem: Education: Goal: Knowledge of General Education information will improve Description Including pain rating scale, medication(s)/side effects and non-pharmacologic comfort measures Outcome: Progressing

## 2024-01-23 NOTE — Progress Notes (Signed)
 PHARMACY - ANTICOAGULATION CONSULT NOTE  Pharmacy Consult for heparin  Indication: chest pain/ACS  Allergies  Allergen Reactions   Paxil [Paroxetine Hcl] Palpitations   Codeine Other (See Comments)   Eliquis  [Apixaban ] Other (See Comments)    dizziness   Pravachol [Pravastatin Sodium] Other (See Comments)    myalgias   Zetia  [Ezetimibe ] Other (See Comments)    myalgias   Penicillins Hives, Rash Dalton Other (See Comments)    Happened in 1959   Vytorin [Ezetimibe -Simvastatin] Other (See Comments)    myalgias   Patient Measurements: Height: 6' (182.9 cm) Weight: 79.6 kg (175 lb 7.8 oz) IBW/kg (Calculated) : 77.6 Heparin  Dosing Weight: 79.6 kg  Vital Signs: BP: 142/83 (02/06 0500) Pulse Rate: 69 (02/06 0500)  Labs: Recent Labs    01/22/24 1500 01/22/24 1527 01/22/24 1527 01/22/24 1808 01/22/24 2224 01/23/24 0009 01/23/24 0507 01/23/24 0608  HGB  --  13.5  --   --   --   --   --   --   HCT  --  40.4  --   --   --   --   --   --   PLT  --  134*  --   --   --   --   --   --   LABPROT 22.8*  --   --   --   --   --   --   --   INR 2.0*  --   --   --   --   --   --   --   HEPARINUNFRC  --   --   --   --   --   --   --  0.18*  CREATININE  --  0.79  --   --   --   --  0.77  --   TROPONINIHS  --  808*   < > 876* 1,368* 1,257*  --   --    < > = values in this interval not displayed.    Estimated Creatinine Clearance: 64.7 mL/min (by C-G formula based on SCr of 0.77 mg/dL).   Medical History: Past Medical History:  Diagnosis Date   Anal fissure    Arthritis    Dalton fibrillation (HCC)    BPH (benign prostatic hypertrophy)    CAD (coronary artery disease)    Dr. Blanca   Cataract    COVID-19    Depression 2004   Diverticulosis    GERD (gastroesophageal reflux disease)    History of TIAs    Hyperlipidemia    Hypertension    Hypertensive heart disease without CHF    Hypothyroidism    Internal hemorrhoids    Lumbar disc disease    Oral bleeding 08/20/2012    Following molar tooth extraction July, 2013   Pneumonia    Ruptured disk 1995   Shingles    Thyroid  disease    Vitamin D  deficiency    Vitreous detachment    Assessment: 89 y/o male presenting with numbness around lips Dalton dizziness. PMH significant for sick sinus syndrome status post pacemaker, Dalton Vaughan, Dalton Vaughan, Dalton level found to be elevated. Pharmacy has been consulted to initiate heparin  infusion.  Home Vaughan regimen: 5 mg daily on Wed & Vaughan, Dalton Vaughan dose: Dalton mg on evening of 01/21/24  Baseline labs: INR 2.0, Hgb 13.5, plt 134  2/6 AM update:  Heparin   level sub-therapeutic   Goal of Therapy:  Heparin  level 0.3-0.7 units/ml Monitor platelets by anticoagulation protocol: Yes   Plan:  Inc heparin  to 1100 units/hr 8 hour heparin  level  Lynwood Mckusick, PharmD, BCPS Clinical Pharmacist Phone: [phone]

## 2024-01-23 NOTE — Progress Notes (Signed)
 PHARMACY - ANTICOAGULATION CONSULT NOTE  Pharmacy Consult for heparin  Indication: chest pain/ACS  Allergies  Allergen Reactions   Paxil [Paroxetine Hcl] Palpitations   Codeine Other (See Comments)   Eliquis  [Apixaban ] Other (See Comments)    dizziness   Pravachol [Pravastatin Sodium] Other (See Comments)    myalgias   Zetia  [Ezetimibe ] Other (See Comments)    myalgias   Penicillins Hives, Rash and Other (See Comments)    Happened in 1959   Vytorin [Ezetimibe -Simvastatin] Other (See Comments)    myalgias   Patient Measurements: Height: 6' (182.9 cm) Weight: 79.6 kg (175 lb 7.8 oz) IBW/kg (Calculated) : 77.6 Heparin  Dosing Weight: 79.6 kg  Vital Signs: BP: 142/83 (02/06 0500) Pulse Rate: 69 (02/06 0500)  Labs: Recent Labs    01/22/24 1500 01/22/24 1527 01/22/24 1527 01/22/24 1808 01/22/24 2224 01/23/24 0009 01/23/24 0507 01/23/24 0608  HGB  --  13.5  --   --   --   --  13.6  --   HCT  --  40.4  --   --   --   --  41.9  --   PLT  --  134*  --   --   --   --  94*  --   LABPROT 22.8*  --   --   --   --   --   --   --   INR 2.0*  --   --   --   --   --   --   --   HEPARINUNFRC  --   --   --   --   --   --   --  0.18*  CREATININE  --  0.79  --   --   --   --  0.77  --   TROPONINIHS  --  808*   < > 876* 1,368* 1,257*  --   --    < > = values in this interval not displayed.    Estimated Creatinine Clearance: 64.7 mL/min (by C-G formula based on SCr of 0.77 mg/dL).   Medical History: Past Medical History:  Diagnosis Date   Anal fissure    Arthritis    Atrial fibrillation (HCC)    BPH (benign prostatic hypertrophy)    CAD (coronary artery disease)    Dr. Blanca   Cataract    COVID-19    Depression 2004   Diverticulosis    GERD (gastroesophageal reflux disease)    History of TIAs    Hyperlipidemia    Hypertension    Hypertensive heart disease without CHF    Hypothyroidism    Internal hemorrhoids    Lumbar disc disease    Oral bleeding 08/20/2012    Following molar tooth extraction July, 2013   Pneumonia    Ruptured disk 1995   Shingles    Thyroid  disease    Vitamin D  deficiency    Vitreous detachment    Assessment: 89 y/o male presenting with numbness around lips and dizziness. PMH significant for sick sinus syndrome status post pacemaker, atrial fibrillation on warfarin, and CAD status post CABG. In ED, troponin level found to be elevated. Pharmacy has been consulted to initiate heparin  infusion.  Home warfarin regimen: 5 mg daily on Wed & Fri, 2.5 mg all other days (Total weekly dose of 22.5 mg) Last dose: 2.5 mg on evening of 01/21/24  Heparin  level now at goal, INR still within range at 2.1 (warfarin continues to be held). CBC within normal limits.  No bleeding or IV issues noted. Patient may need transferred to [facility] for cath.   Goal of Therapy:  Heparin  level 0.3-0.7 units/ml Monitor platelets by anticoagulation protocol: Yes   Plan:  Continue Heparin  at 1100/hr Recheck 8 hour heparin  level  Dempsey Blush PharmD., BCPS Clinical Pharmacist 01/23/2024 8:05 AM

## 2024-01-23 NOTE — Consult Note (Addendum)
 Cardiology Consultation   Patient ID: Dalton Vaughan MRN: [id number]; DOB: [birth year]  Admit date: 01/22/2024 Date of Consult: 01/23/2024  PCP:  Jolinda Norene HERO, DO   [facility] Providers Cardiologist: Previously Dr. Monetta - Now followed by [facility]  Patient Profile:   Dalton Vaughan is a 89 y.o. male with a hx of CAD (s/p CABG in 1999), permanent atrial fibrillation, SSS (s/p Medtronic PPM placement at [facility] in 2021), HTN, HLD, GERD and prior TIA's who is being seen 01/23/2024 for the evaluation of an NSTEMI at the request of Dr. Manfred.  History of Present Illness:   [name] presented to [facility] on 01/22/2024 for evaluation of dizziness and numbness along his lips which had began the day prior to arrival. In talking with the patient today, he reports he is usually active at baseline and performs ADL's independently. He also walks on a treadmill several days a week for approximately 30 minutes and lifts weights. He denies any recent chest pain or dyspnea on exertion while performing these activities. Over the past 3 days prior to admission, he reports having episodes of bilateral shoulder pain which was radiating underneath his left arm. This would occur at rest or with activity and episodes would last for 10 to 15 minutes. He has not utilized nitroglycerin  as he reports having not used this since CABG in 1999. Denies any orthopnea, PND or pitting edema. Reports having intermittent dizziness which has been occurring for several years and no acute changes in this. He denies any recent palpitations and has overall been asymptomatic with his atrial fibrillation. By review of [facility] notes from 10/2023, he was previously intolerant to beta-blocker therapy and Cardizem. Also declined Digoxin in the past.  Initial labs showed WBC 7.7, Hgb 13.5, platelets 134K, Na+ 134, K+ 3.8 and creatinine 0.79.  Magnesium  2.2. INR 2.0. Initial and repeat Hs Troponin values have  been elevated at 808, 876, 1368 and 1257. CXR showed a small left pleural effusion with basilar atelectasis or infiltration. CT Head showed no acute intracranial abnormality or significant interval changes. Was noted to have stable atrophy and white matter disease which was likely normal for age. Also noted to have a remote lacunar infarct of the left thalamus.EKG shows V-paced rhythm, HR 77 with PVC's.  He was started on IV Heparin  for anticoagulation as he had been on Coumadin  for anticoagulation prior to admission and INR was at 2.0. Remains on Crestor  5 mg every other day, Ramipril  10 mg daily, Amlodipine  5 mg daily and Cardura  8 mg daily.  He has not been started on ASA.  Past Medical History:  Diagnosis Date   Anal fissure    Arthritis    Atrial fibrillation (HCC)    BPH (benign prostatic hypertrophy)    CAD (coronary artery disease)    Dr. Blanca   Cataract    COVID-19    Depression 2004   Diverticulosis    GERD (gastroesophageal reflux disease)    History of TIAs    Hyperlipidemia    Hypertension    Hypertensive heart disease without CHF    Hypothyroidism    Internal hemorrhoids    Lumbar disc disease    Oral bleeding 08/20/2012   Following molar tooth extraction July, 2013   Pneumonia    Ruptured disk 1995   Shingles    Thyroid  disease    Vitamin D  deficiency    Vitreous detachment     Past Surgical History:  Procedure Laterality  Date   bilateral cataract surgery  6/07   CARDIAC CATHETERIZATION     CORONARY ARTERY BYPASS GRAFT  12/99    Dr. Army    cysloscopy  8/08   EYE SURGERY     HERNIA REPAIR  1997   right   herninated disc  1995   PACEMAKER INSERTION  11/03/2020   PROSTATE BIOPSY  8/08   TONSILLECTOMY AND ADENOIDECTOMY       Home Medications:  Prior to Admission medications   Medication Sig Start Date End Date Taking? Authorizing Provider  amLODipine  (NORVASC ) 5 MG tablet Take 1 tablet (5 mg total) by mouth daily. 09/10/23  Yes Gottschalk, Norene M,  DO  azelastine  (ASTELIN ) 0.1 % nasal spray Place 1 spray into both nostrils 2 (two) times daily. 02/27/23  Yes Jolinda Norene M, DO  Calcium  Carb-Cholecalciferol (CALCIUM  + D3) 600-200 MG-UNIT TABS Take 1 tablet by mouth daily.    Yes [provider]  doxazosin  (CARDURA ) 8 MG tablet Take 1 tablet (8 mg total) by mouth at bedtime. 11/25/23  Yes Gottschalk, Norene M, DO  Lactobacillus (PROBIOTIC ACIDOPHILUS) CAPS Take 1 capsule by mouth daily.   Yes [provider]  levothyroxine  (SYNTHROID ) 50 MCG tablet Take 1 tablet (50 mcg total) by mouth daily before breakfast. 11/25/23  Yes Gottschalk, Ashly M, DO  meclizine (ANTIVERT) 25 MG tablet Take 25 mg by mouth daily as needed for dizziness.   Yes [provider]  mirtazapine  (REMERON ) 15 MG tablet Take 1 tablet (15 mg total) by mouth at bedtime. Patient taking differently: Take 3.5 mg by mouth at bedtime. 09/10/23  Yes Jolinda Norene M, DO  Multiple Vitamin (MULTIVITAMIN) tablet Take 1 tablet by mouth daily.   Yes [provider]  ramipril  (ALTACE ) 10 MG capsule TAKE 1 CAPSULE DAILY 11/07/23  Yes Jolinda Norene M, DO  rosuvastatin  (CRESTOR ) 5 MG tablet Take 1 tablet by mouth Mondays, Wednesdays and Fridays 09/18/22  Yes Gottschalk, Ashly M, DO  SIMETHICONE  PO Take 1 tablet by mouth as needed (gas).   Yes [provider]  triamterene -hydrochlorothiazide  (MAXZIDE -25) 37.5-25 MG tablet Take 1 tablet by mouth daily. 12/13/23  Yes Jolinda Norene M, DO  vitamin C (ASCORBIC ACID) 500 MG tablet Take 500 mg by mouth daily.    Yes [provider]  warfarin (COUMADIN ) 5 MG tablet TAKE 1 TABLET DAILY AS DIRECTED Patient taking differently: Take 5 mg by mouth daily. 0.5 tablet on Sunday, Morning, Tuesday, Thursday, and Saturday 1 tablet on Wednesday and Friday 02/11/23  Yes Jolinda Norene HERO, DO    Inpatient Medications: Scheduled Meds:  amLODipine   5 mg Oral Daily   doxazosin   8 mg Oral QHS    levothyroxine   50 mcg Oral Q0600   ramipril   10 mg Oral Daily   [START ON 01/24/2024] rosuvastatin   5 mg Oral QODAY   Continuous Infusions:  heparin  1,100 Units/hr (01/23/24 0650)   PRN Meds: acetaminophen  **OR** acetaminophen , prochlorperazine   Allergies:    Allergies  Allergen Reactions   Paxil [Paroxetine Hcl] Palpitations   Codeine Other (See Comments)   Eliquis  [Apixaban ] Other (See Comments)    dizziness   Pravachol [Pravastatin Sodium] Other (See Comments)    myalgias   Zetia  [Ezetimibe ] Other (See Comments)    myalgias   Penicillins Hives, Rash and Other (See Comments)    Happened in 1959   Vytorin [Ezetimibe -Simvastatin] Other (See Comments)    myalgias    Social History:   Social History   Tobacco  Use   Smoking status: Former    Current packs/day: 0.00    Average packs/day: 1 pack/day for 18.0 years (18.0 ttl pk-yrs)    Types: Cigarettes    Start date: 12/17/1948    Quit date: 12/17/1966    Years since quitting: 57.1   Smokeless tobacco: Never  Substance Use Topics   Alcohol use: Yes    Alcohol/week: 1.0 standard drink of alcohol    Types: 1 Standard drinks or equivalent per week    Comment: rare     Family History:    Family History  Problem Relation Age of Onset   Dementia Mother    Arthritis Mother    Cancer Brother        prostate   Asthma Sister    Heart disease Paternal Aunt    Heart disease Paternal Uncle    Hypertension Maternal Grandmother    CVA Maternal Grandmother    CVA Paternal Grandmother    Hepatitis C Son    Arthritis Son    Colon cancer Neg Hx    Colon polyps Neg Hx    Kidney disease Neg Hx    Esophageal cancer Neg Hx    Gallbladder disease Neg Hx    Diabetes Neg Hx      ROS:  Please see the history of present illness. All other ROS reviewed and negative.     Physical Exam/Data:   Vitals:   01/23/24 0000 01/23/24 0100 01/23/24 0200 01/23/24 0500  BP: (!) 150/91 119/69 134/78 (!) 142/83  Pulse: 76 70 78 69  Resp: 19  11 20 12   Temp:      TempSrc:      SpO2: 95% 94% 94% 96%  Weight:      Height:       No intake or output data in the 24 hours ending 01/23/24 0808    01/22/2024    2:32 PM 01/22/2024    2:30 PM 01/16/2024   11:24 AM  Last 3 Weights  Weight (lbs) 175 lb 7.8 oz 175 lb 7.8 oz 175 lb 7.8 oz  Weight (kg) 79.6 kg 79.6 kg 79.6 kg     Body mass index is 23.8 kg/m.  General:  Well nourished, well developed male appearing in no acute distress. HEENT: normal Neck: no JVD Vascular: No carotid bruits; Distal pulses 2+ bilaterally Cardiac:  normal S1, S2; Irregularly irregular Lungs: No wheezing or rales. Scattered rhonchi.  Abd: soft, nontender, no hepatomegaly  Ext: no pitting edema Musculoskeletal:  No deformities, BUE and BLE strength normal and equal Skin: warm and dry  Neuro:  CNs 2-12 intact, no focal abnormalities noted Psych:  Normal affect   EKG:  The EKG was personally reviewed and demonstrates:  V-paced rhythm, HR 77 with PVC's. Telemetry:  Telemetry was personally reviewed and demonstrates: V-paced, HR in 70's.  Relevant CV Studies:  Echocardiogram: 11/2018 Study Conclusions   - Left ventricle: The cavity size was normal. Wall thickness was    increased in a pattern of mild LVH. Systolic function was normal.    The estimated ejection fraction was in the range of 60% to 65%.    Wall motion was normal; there were no regional wall motion    abnormalities.  - Aortic valve: There was trivial regurgitation. Valve area (Vmax):    3.52 cm^2.  - Left atrium: The atrium was moderately dilated.  - Pulmonary arteries: PA peak pressure: 33 mm Hg (S).   Impressions:   - 1. Left ventricular  systolic function is preserved.    2. Moderate left atrial enlargement.    3. Mild mitral regurgitation.    4. Mild tricuspid regurgitation.    5. Trace aortic regurgitation.    6. The patient appeared to be in atrial fibrillation throughout    the study.   Laboratory Data:  High  Sensitivity Troponin:   Recent Labs  Lab 01/22/24 1527 01/22/24 1808 01/22/24 2224 01/23/24 0009  TROPONINIHS 808* 876* 1,368* 1,257*     Chemistry Recent Labs  Lab 01/22/24 1527 01/23/24 0507  NA 134* 136  K 3.8 3.6  CL 97* 101  CO2 26 26  GLUCOSE 112* 91  BUN 8 8  CREATININE 0.79 0.77  CALCIUM  9.2 8.9  MG 2.2 2.3  GFRNONAA >60 >60  ANIONGAP 11 9    Recent Labs  Lab 01/23/24 0507  PROT 6.5  ALBUMIN 3.9  AST 27  ALT 12  ALKPHOS 81  BILITOT 1.1    Hematology Recent Labs  Lab 01/22/24 1527 01/23/24 0507  WBC 7.7 6.1  RBC 4.36 4.37  HGB 13.5 13.6  HCT 40.4 41.9  MCV 92.7 95.9  MCH 31.0 31.1  MCHC 33.4 32.5  RDW 14.6 14.9  PLT 134* 94*    Radiology/Studies:  CT Head Wo Contrast Result Date: 01/22/2024 CLINICAL DATA:  Lip numbness. Dizziness and numbness in the lips which began yesterday afternoon. EXAM: CT HEAD WITHOUT CONTRAST TECHNIQUE: Contiguous axial images were obtained from the base of the skull through the vertex without intravenous contrast. RADIATION DOSE REDUCTION: This exam was performed according to the departmental dose-optimization program which includes automated exposure control, adjustment of the mA and/or kV according to patient size and/or use of iterative reconstruction technique. COMPARISON:  MR head without contrast 03/05/2017. FINDINGS: Brain: Mild atrophy and white matter changes are similar to the prior exam, likely within normal limits for age. A remote lacunar infarct is again noted within the left thalamus. No acute infarct, hemorrhage, or mass lesion is present. The ventricles are of normal size. No significant extraaxial fluid collection is present. The brainstem and cerebellum are within normal limits. Midline structures are within normal limits. Vascular: No hyperdense vessel or unexpected calcification. Skull: Calvarium is intact. No focal lytic or blastic lesions are present. No significant extracranial soft tissue lesion is  present. Sinuses/Orbits: The paranasal sinuses and mastoid air cells are clear. Bilateral lens replacements are noted. Globes and orbits are otherwise unremarkable. IMPRESSION: 1. No acute intracranial abnormality or significant interval change. 2. Stable atrophy and white matter disease, likely within normal limits for age. 3. Remote lacunar infarct of the left thalamus. Electronically Signed   By: Lonni Necessary M.D.   On: 01/22/2024 17:07   DG Chest 2 View Result Date: 01/22/2024 CLINICAL DATA:  Chest pain and dizziness today. History of atrial fibrillation, coronary artery disease, hypertension. EXAM: CHEST - 2 VIEW COMPARISON:  09/13/2023 FINDINGS: Postoperative changes in the mediastinum. Cardiac pacemaker. Cardiac enlargement. No vascular congestion, edema, or consolidation. Small left pleural effusion with mild basilar atelectasis. No pneumothorax. Mediastinal contours appear intact. Calcification of the aorta. Degenerative changes in the spine and shoulders. IMPRESSION: Cardiac enlargement. Small left pleural effusion with basilar atelectasis or infiltration. Electronically Signed   By: Elsie Gravely M.D.   On: 01/22/2024 17:06    Assessment and Plan:   1. NSTEMI - Presented with worsening bilateral shoulder pain which radiates underneath his left arm and could be his anginal equivalent and this has occurred at rest or  with activity.Hs troponin values have been elevated, peaking at 1368 and downtrending to 1257 on most recent check. EKG shows V paced rhythm.  - While he is 22, he is very active at baseline for his age as he walks on the treadmill several days a week as discussed above. Also performs ADL's independently. Reviewed options with the patient in regards to medical management vs. cardiac catheterization for definitive evaluation and he is undecided on what he would like to pursue. If pursing a cardiac catheterization, INR would need to be less than 1.7 (repeat pending for today).  Will obtain an echocardiogram today to assess for any structural abnormalities. Continue IV Heparin  for anticoagulation (on Coumadin  prior to admission). He has been continued on Crestor  5 mg every other day and would recheck an FLP (LDL 130 when checked in 12/2022). Previously intolerant to higher intensity statin therapy. Will add ASA 81mg  daily. He was previously intolerant to beta-blockers as discussed above. Could add Imdur  30 mg daily for antianginal benefit.  2. CAD - He underwent CABG in 1999 and is unaware of any PCI or stent placement in the interim. Would anticipate repeat ischemic evaluation as discussed above. - Continue statin, ASA and IV Heparin .   3. Permanent Atrial Fibrillation - He has not been on AV nodal blocking agents and heart rate has overall been well-controlled in the 70's to 80's. Previously intolerant to Cardizem and beta-blocker therapy by review of notes from [facility].  - He was on Coumadin  prior to admission which is currently held and being bridged with IV Heparin .   4.  SSS - He is s/p Medtronic PPM placement at [facility] in 2021. Followed by [facility].  5. Thrombocytopenia - Platelet count at 134 K on admission which is similar to prior values. No reports of active bleeding.    Risk Assessment/Risk Scores:     TIMI Risk Score for Unstable Angina or Non-ST Elevation MI:   The patient's TIMI risk score is 5, which indicates a 26% risk of all cause mortality, new or recurrent myocardial infarction or need for urgent revascularization in the next 14 days.   For questions or updates, please contact [facility] Please consult [email or web address] for contact info under    Signed, Laymon CHRISTELLA Qua, PA-C  01/23/2024 8:08 AM   Attending note:  Patient seen and examined.  I reviewed his records and discussed the case with Ms. Strader PA-C, I agree with her above findings.  Mr. Crilly is a former patient of Dr. Blanca, subsequently Dr.  Monetta, and now with [facility] cardiology in [facility].  He has a history of multivessel CAD status post CABG in 1999, also status post Medtronic pacemaker in 2021 due to sick sinus syndrome.  He has permanent atrial fibrillation and is on Coumadin  for stroke prophylaxis.  Patient lives independently, functional with all ADLs, in fact typically walks on the treadmill 30 minutes a day.  He presents to the ER reporting recurring bilateral shoulder discomfort and dizziness both with activity and more recently at rest, reminiscent of previous angina.  Symptoms generally last for 10 to 15 minutes.  He has not used any nitroglycerin  but otherwise continues on regular medical therapy.  High-sensitivity troponin I levels in the ER are consistent with NSTEMI.  On examination the patient appears comfortable, no active shoulder discomfort or shortness of breath.  He is afebrile, heart rate in the 80s with ventricular pacing evident by telemetry.  Blood pressure 141/81.  Lungs exhibit decreased  breath sounds at the bases.  Cardiac exam with RRR and soft systolic murmur.  Pertinent lab work includes potassium 3.6, creatinine 0.77, peak high-sensitivity troponin I 1368, hemoglobin 13.6, platelets 94, INR 2.0.  Chest x-Tod reports cardiomegaly with small left pleural effusion and atelectasis.  ECG shows atrial fibrillation with ventricular pacing and occasional intrinsic conduction, nonspecific ST-T changes.  Patient presents with unstable angina and enzymatic evidence of NSTEMI.  89 years old, but functionally active at baseline as discussed above.  Reasonable to consider diagnostic cardiac catheterization to assess native coronary and bypass graft anatomy for revascularization options.  Risks and benefits discussed and he is in agreement to proceed.  Coumadin  is on hold with presenting INR 2.0, repeat pending.  Plan to initiate low-dose aspirin , continue statin and IV heparin .  He does have thrombocytopenia which is  chronic, recent platelet count 90s to 130s and no spontaneous bleeding.  Communicating with hospitalist team regarding transfer to [facility] and also getting him scheduled for cardiac catheterization when INR appropriate.  Echocardiogram ordered as well.  Jayson JUDITHANN Sierras, M.D., F.A.C.C.

## 2024-01-23 NOTE — Progress Notes (Signed)
 PROGRESS NOTE    Dalton Vaughan  FMW:[id number] DOB: 20-Nov-1929 DOA: 01/22/2024 PCP: Jolinda Norene HERO, DO   Brief Narrative:    Dalton Vaughan is a 89 y.o. male with medical history significant of chronic atrial fibrillation on warfarin, sick sinus syndrome status post pacemaker, CAD s/p CABG who presents to the emergency department due to numbness sensation around his lips that started at noon today (of note, patient states that he has been having this sensation on and off for about 5 to 6 years) and also complains of bilateral shoulder pain which was also chronic, but worsened about 4 days ago and last for several hours at a time and is associated with lightheadedness.  He was noted to have elevated troponin levels and was admitted for NSTEMI and started on heparin  drip.  Cardiology recommending transfer to [facility] for cardiac catheterization pending INR.  Assessment & Plan:   Principal Problem:   NSTEMI (non-ST elevated myocardial infarction) [facility]) Active Problems:   Chronic atrial fibrillation (HCC)   Benign prostatic hyperplasia   Mixed hyperlipidemia   Lip numbness   Thrombocytopenia (HCC)   Essential hypertension   Cervical spondylosis without myelopathy   Shoulder pain, bilateral   Prolonged QT interval   Acquired hypothyroidism  Assessment and Plan:   NSTEMI Troponin peaked at 1368 Keep n.p.o. except medications Transfer to [facility] per cardiology for anticipated cardiac catheterization pending INR   Lip numbness This appears to be a chronic on and off problem which already resolved at bedside, but this has been ongoing for years CT head without contrast showed no acute intracranial abnormality or significant interval change    Bilateral shoulder pain This also appears to be chronic, though the pain worsened within the last few days MR angiography of neck done in September 2024 showed normal MR angiography of the neck vessels. Reviewing patient's charts, it  appears that patient has a history of cervicalgia MR cervical spine without contrast done in December 2024 showed  Generalized cervical spine degeneration with foraminal impingement bilaterally at C3-4 to C5-6 and on the right at C6-7. Up to mild spinal stenosis at C4-5. Patient was following with [facility] and has a pending MRI brain without contrast on 01/29/2024 Continue Tylenol  as needed   Prolonged QT interval QTc 523 ms Patient is ventricular paced Avoid QT prolonging drugs Magnesium  level will be checked Repeat EKG in the morning   Chronic thrombocytopenia-downward trending Continue to monitor platelet levels while on heparin  drip   Chronic atrial fibrillation Continue warfarin.  INR is 2.0, continue to monitor INR   Essential hypertension Continue amlodipine , ramipril    Mixed hyperlipidemia Continue Crestor    Acquired hypothyroidism Continue Synthroid    BPH Continue doxazosin     DVT prophylaxis:Heparin  drip Code Status: Full Family Communication: None at bedside Disposition Plan:  Status is: Observation The patient will require care spanning > 2 midnights and should be moved to inpatient because: Need for IV medications and inpatient evaluation.   Consultants:  Cardiology  Procedures:  None  Antimicrobials:  None   Subjective: Patient seen and evaluated today with no new acute complaints or concerns. No acute concerns or events noted overnight.  He denies any chest pain or shortness of breath this morning.  Objective: Vitals:   01/23/24 0000 01/23/24 0100 01/23/24 0200 01/23/24 0500  BP: (!) 150/91 119/69 134/78 (!) 142/83  Pulse: 76 70 78 69  Resp: 19 11 20 12   Temp:      TempSrc:  SpO2: 95% 94% 94% 96%  Weight:      Height:       No intake or output data in the 24 hours ending 01/23/24 0659 Filed Weights   01/22/24 1430 01/22/24 1432  Weight: 79.6 kg 79.6 kg    Examination:  General exam: Appears  calm and comfortable  Respiratory system: Clear to auscultation. Respiratory effort normal. Cardiovascular system: S1 & S2 heard, RRR.  Gastrointestinal system: Abdomen is soft Central nervous system: Alert and awake Extremities: No edema Skin: No significant lesions noted Psychiatry: Flat affect.    Data Reviewed: I have personally reviewed following labs and imaging studies  CBC: Recent Labs  Lab 01/22/24 1527 01/23/24 0507  WBC 7.7 6.1  HGB 13.5 13.6  HCT 40.4 41.9  MCV 92.7 95.9  PLT 134* 94*   Basic Metabolic Panel: Recent Labs  Lab 01/22/24 1527 01/23/24 0507  NA 134* 136  K 3.8 3.6  CL 97* 101  CO2 26 26  GLUCOSE 112* 91  BUN 8 8  CREATININE 0.79 0.77  CALCIUM  9.2 8.9  MG 2.2 2.3  PHOS  --  2.7   GFR: Estimated Creatinine Clearance: 64.7 mL/min (by C-G formula based on SCr of 0.77 mg/dL). Liver Function Tests: Recent Labs  Lab 01/23/24 0507  AST 27  ALT 12  ALKPHOS 81  BILITOT 1.1  PROT 6.5  ALBUMIN 3.9   No results for input(s): LIPASE, AMYLASE in the last 168 hours. No results for input(s): AMMONIA in the last 168 hours. Coagulation Profile: Recent Labs  Lab 01/22/24 1500  INR 2.0*   Cardiac Enzymes: No results for input(s): CKTOTAL, CKMB, CKMBINDEX, TROPONINI in the last 168 hours. BNP (last 3 results) No results for input(s): PROBNP in the last 8760 hours. HbA1C: No results for input(s): HGBA1C in the last 72 hours. CBG: No results for input(s): GLUCAP in the last 168 hours. Lipid Profile: No results for input(s): CHOL, HDL, LDLCALC, TRIG, CHOLHDL, LDLDIRECT in the last 72 hours. Thyroid  Function Tests: No results for input(s): TSH, T4TOTAL, FREET4, T3FREE, THYROIDAB in the last 72 hours. Anemia Panel: No results for input(s): VITAMINB12, FOLATE, FERRITIN, TIBC, IRON, RETICCTPCT in the last 72 hours. Sepsis Labs: No results for input(s): PROCALCITON, LATICACIDVEN in the  last 168 hours.  No results found for this or any previous visit (from the past 240 hours).       Radiology Studies: CT Head Wo Contrast Result Date: 01/22/2024 CLINICAL DATA:  Lip numbness. Dizziness and numbness in the lips which began yesterday afternoon. EXAM: CT HEAD WITHOUT CONTRAST TECHNIQUE: Contiguous axial images were obtained from the base of the skull through the vertex without intravenous contrast. RADIATION DOSE REDUCTION: This exam was performed according to the departmental dose-optimization program which includes automated exposure control, adjustment of the mA and/or kV according to patient size and/or use of iterative reconstruction technique. COMPARISON:  MR head without contrast 03/05/2017. FINDINGS: Brain: Mild atrophy and white matter changes are similar to the prior exam, likely within normal limits for age. A remote lacunar infarct is again noted within the left thalamus. No acute infarct, hemorrhage, or mass lesion is present. The ventricles are of normal size. No significant extraaxial fluid collection is present. The brainstem and cerebellum are within normal limits. Midline structures are within normal limits. Vascular: No hyperdense vessel or unexpected calcification. Skull: Calvarium is intact. No focal lytic or blastic lesions are present. No significant extracranial soft tissue lesion is present. Sinuses/Orbits: The paranasal sinuses and mastoid  air cells are clear. Bilateral lens replacements are noted. Globes and orbits are otherwise unremarkable. IMPRESSION: 1. No acute intracranial abnormality or significant interval change. 2. Stable atrophy and white matter disease, likely within normal limits for age. 3. Remote lacunar infarct of the left thalamus. Electronically Signed   By: Lonni Necessary M.D.   On: 01/22/2024 17:07   DG Chest 2 View Result Date: 01/22/2024 CLINICAL DATA:  Chest pain and dizziness today. History of atrial fibrillation, coronary artery  disease, hypertension. EXAM: CHEST - 2 VIEW COMPARISON:  09/13/2023 FINDINGS: Postoperative changes in the mediastinum. Cardiac pacemaker. Cardiac enlargement. No vascular congestion, edema, or consolidation. Small left pleural effusion with mild basilar atelectasis. No pneumothorax. Mediastinal contours appear intact. Calcification of the aorta. Degenerative changes in the spine and shoulders. IMPRESSION: Cardiac enlargement. Small left pleural effusion with basilar atelectasis or infiltration. Electronically Signed   By: Elsie Gravely M.D.   On: 01/22/2024 17:06        Scheduled Meds:  amLODipine   5 mg Oral Daily   doxazosin   8 mg Oral QHS   levothyroxine   50 mcg Oral Q0600   ramipril   10 mg Oral Daily   [START ON 01/24/2024] rosuvastatin   5 mg Oral QODAY   Continuous Infusions:  heparin  1,100 Units/hr (01/23/24 0650)     LOS: 0 days    Time spent: 55 minutes    Recardo Linn D Maree, DO [facility]  If 7PM-7AM, please contact night-coverage [email or web address] 01/23/2024, 6:59 AM

## 2024-01-23 NOTE — Progress Notes (Signed)
*  PRELIMINARY RESULTS* Echocardiogram 2D Echocardiogram has been performed.  Dalton Vaughan 01/23/2024, 12:45 PM

## 2024-01-24 DIAGNOSIS — I4821 Permanent atrial fibrillation: Secondary | ICD-10-CM | POA: Diagnosis not present

## 2024-01-24 DIAGNOSIS — I214 Non-ST elevation (NSTEMI) myocardial infarction: Secondary | ICD-10-CM | POA: Diagnosis not present

## 2024-01-24 LAB — PROTIME-INR
INR: 2.2 — ABNORMAL HIGH (ref 0.8–1.2)
Prothrombin Time: 24.9 s — ABNORMAL HIGH (ref 11.4–15.2)

## 2024-01-24 LAB — CBC
HCT: 35.3 % — ABNORMAL LOW (ref 39.0–52.0)
Hemoglobin: 11.8 g/dL — ABNORMAL LOW (ref 13.0–17.0)
MCH: 30.3 pg (ref 26.0–34.0)
MCHC: 33.4 g/dL (ref 30.0–36.0)
MCV: 90.7 fL (ref 80.0–100.0)
Platelets: 119 10*3/uL — ABNORMAL LOW (ref 150–400)
RBC: 3.89 MIL/uL — ABNORMAL LOW (ref 4.22–5.81)
RDW: 14.6 % (ref 11.5–15.5)
WBC: 6.9 10*3/uL (ref 4.0–10.5)
nRBC: 0 % (ref 0.0–0.2)

## 2024-01-24 LAB — BASIC METABOLIC PANEL
Anion gap: 8 (ref 5–15)
BUN: 8 mg/dL (ref 8–23)
CO2: 27 mmol/L (ref 22–32)
Calcium: 8.5 mg/dL — ABNORMAL LOW (ref 8.9–10.3)
Chloride: 101 mmol/L (ref 98–111)
Creatinine, Ser: 0.75 mg/dL (ref 0.61–1.24)
GFR, Estimated: >60 mL/min (ref >60)
Glucose, Bld: 88 mg/dL (ref 70–99)
Potassium: 3.2 mmol/L — ABNORMAL LOW (ref 3.5–5.1)
Sodium: 136 mmol/L (ref 135–145)

## 2024-01-24 LAB — LIPID PANEL
Cholesterol: 150 mg/dL (ref 0–200)
HDL: 43 mg/dL (ref >40)
LDL Cholesterol: 101 mg/dL — ABNORMAL HIGH (ref 0–99)
Total CHOL/HDL Ratio: 3.5 {ratio}
Triglycerides: 32 mg/dL (ref <150)
VLDL: 6 mg/dL (ref 0–40)

## 2024-01-24 LAB — HEPARIN LEVEL (UNFRACTIONATED): Heparin Unfractionated: 0.36 [IU]/mL (ref 0.30–0.70)

## 2024-01-24 LAB — MAGNESIUM: Magnesium: 2 mg/dL (ref 1.7–2.4)

## 2024-01-24 MED ORDER — POLYETHYLENE GLYCOL 3350 17 G PO PACK
17.0000 g | PACK | Freq: Every day | ORAL | Status: DC | PRN
  Administered 2024-01-25 – 2024-01-27 (×3): 17 g via ORAL
  Filled 2024-01-24 (×4): qty 1

## 2024-01-24 MED ORDER — EZETIMIBE 10 MG PO TABS
10.0000 mg | ORAL_TABLET | Freq: Every day | ORAL | Status: DC
  Administered 2024-01-24 – 2024-01-28 (×5): 10 mg via ORAL
  Filled 2024-01-24 (×5): qty 1

## 2024-01-24 MED ORDER — SENNA 8.6 MG PO TABS
1.0000 | ORAL_TABLET | Freq: Every day | ORAL | Status: DC
  Administered 2024-01-24 – 2024-01-27 (×3): 8.6 mg via ORAL
  Filled 2024-01-24 (×4): qty 1

## 2024-01-24 MED ORDER — POTASSIUM CHLORIDE CRYS ER 20 MEQ PO TBCR
40.0000 meq | EXTENDED_RELEASE_TABLET | ORAL | Status: AC
  Administered 2024-01-24 (×2): 40 meq via ORAL
  Filled 2024-01-24: qty 2
  Filled 2024-01-24: qty 4

## 2024-01-24 NOTE — Progress Notes (Signed)
 PHARMACY - ANTICOAGULATION CONSULT NOTE  Pharmacy Consult for heparin  Indication: chest pain/ACS  Allergies  Allergen Reactions   Paxil [Paroxetine Hcl] Palpitations   Codeine Other (See Comments)   Eliquis  [Apixaban ] Other (See Comments)    dizziness   Pravachol [Pravastatin Sodium] Other (See Comments)    myalgias   Zetia  [Ezetimibe ] Other (See Comments)    myalgias   Penicillins Hives, Rash and Other (See Comments)    Happened in 1959   Vytorin [Ezetimibe -Simvastatin] Other (See Comments)    myalgias   Patient Measurements: Height: 6' (182.9 cm) Weight: 74.3 kg (163 lb 12.8 oz) IBW/kg (Calculated) : 77.6 Heparin  Dosing Weight: 79.6 kg  Vital Signs: Temp: 98.3 F (36.8 C) (02/07 0614) Temp Source: Oral (02/07 0614) BP: 119/71 (02/07 0614)  Labs: Recent Labs    01/22/24 1527 01/22/24 1808 01/22/24 2224 01/23/24 0009 01/23/24 0507 01/23/24 0510 01/23/24 0608 01/23/24 1332 01/23/24 1936 01/24/24 0340  HGB 13.5  --   --   --  13.6  --   --   --   --  11.8*  HCT 40.4  --   --   --  41.9  --   --   --   --  35.3*  PLT 134*  --   --   --  94*  --   --   --   --  119*  LABPROT  --   --   --   --   --  24.8*  --  23.5*  --  24.9*  INR  --   --   --   --   --  2.2*  --  2.1*  --  2.2*  HEPARINUNFRC  --   --   --   --   --   --    < > 0.32 0.32 0.36  CREATININE 0.79  --   --   --  0.77  --   --   --   --  0.75  TROPONINIHS 808* 876* 1,368* 1,257*  --   --   --   --   --   --    < > = values in this interval not displayed.    Estimated Creatinine Clearance: 61.9 mL/min (by C-G formula based on SCr of 0.75 mg/dL).   Medical History: Past Medical History:  Diagnosis Date   Anal fissure    Arthritis    Atrial fibrillation (HCC)    BPH (benign prostatic hypertrophy)    CAD (coronary artery disease)    Dr. Blanca   Cataract    COVID-19    Depression 2004   Diverticulosis    GERD (gastroesophageal reflux disease)    History of TIAs    Hyperlipidemia     Hypertension    Hypertensive heart disease without CHF    Hypothyroidism    Internal hemorrhoids    Lumbar disc disease    Oral bleeding 08/20/2012   Following molar tooth extraction July, 2013   Pneumonia    Ruptured disk 1995   Shingles    Thyroid  disease    Vitamin D  deficiency    Vitreous detachment    Assessment: 89 y/o male presenting with numbness around lips and dizziness. PMH significant for sick sinus syndrome status post pacemaker, atrial fibrillation on warfarin, and CAD status post CABG. In ED, troponin level found to be elevated. Pharmacy has been consulted to initiate heparin  infusion.  - heparin  level 0.36 is therapeutic on heparin  1100 units/hr.   -  plt= 119 (history of low plt) -plans for cath when INR < 1.7  Home warfarin regimen: 5 mg daily on Wed & Fri, 2.5 mg all other days (Total weekly dose of 22.5 mg) Last dose: 2.5 mg on evening of 01/21/24    Goal of Therapy:  Heparin  level 0.3-0.7 units/ml Monitor platelets by anticoagulation protocol: Yes   Plan:  Continue Heparin  at 1100/hr Daily heparin  level, PT/INR and CBC   Prentice Poisson, PharmD Clinical Pharmacist **Pharmacist phone directory can now be found on [email or web address] ([email or web address]).  Listed under [facility].

## 2024-01-24 NOTE — Progress Notes (Signed)
 Mobility Specialist Progress Note;   01/24/24 1006  Mobility  Activity Ambulated with assistance in hallway  Level of Assistance Standby assist, set-up cues, supervision of patient - no hands on  Assistive Device Other (Comment) (IV pole)  Distance Ambulated (ft) 375 ft  Activity Response Tolerated well  Mobility Referral Yes  Mobility visit 1 Mobility  Mobility Specialist Start Time (ACUTE ONLY) 1006  Mobility Specialist Stop Time (ACUTE ONLY) 1015  Mobility Specialist Time Calculation (min) (ACUTE ONLY) 9 min   Pt agreeable to mobility. Required no physical assistance during ambulation, SV. VSS throughout and no c/o. Stated it felt good to be up walking. Pt returned back to bed with all needs met. MD in room.   Lauraine Erm Mobility Specialist Please contact via SecureChat or [employer] [phone]

## 2024-01-24 NOTE — Progress Notes (Signed)
 PROGRESS NOTE  Dalton Vaughan FMW:[id number] DOB: 05/13/1929   PCP: Jolinda Norene HERO, DO  Patient is from: Home.  Independently ambulates at baseline.  DOA: 01/22/2024 LOS: 1  Chief complaints Chief Complaint  Patient presents with   Dizziness     Brief Narrative / Interim history: 89 year old M with PMH of A-fib on warfarin, SSS/PPM, CAD/CABG in 1999, HTN, BPH, hypothyroidism, cervicalgia and chronic bilateral shoulder pain presented to [facility] with numbness sensation around his lips, and found to have non-STEMI.  No report of chest pain or dyspnea but some lightheadedness associated with numbness.  Reportedly had intermittent numbness for 5 to 6 years.  Also chronic bilateral shoulder pain on and off.  He is followed by neurosurgery for his cervicalgia.   In ED, troponin elevated to 808 and trended up to 1368.  EKG rate controlled A-fib with nonspecific ST and T wave changes.  CXR with cardiac enlargement and small left pleural effusion.  CT head without acute finding.  Cardiology consulted.  Started on heparin  drip.  Transferred to [facility] for heart catheterization when INR improves.  Subjective: Seen and examined earlier this morning.  No major events overnight of this morning.  Patient denies chest pain, shortness of breath, exertional chest pain or DOE.  No further numbness.  Continues to report chronic shoulder pain.   Objective: Vitals:   01/23/24 2340 01/24/24 0614 01/24/24 0800 01/24/24 1043  BP: 112/72 119/71 121/73 110/74  Pulse:   78   Resp:  17 12   Temp:  98.3 F (36.8 C) 97.9 F (36.6 C)   TempSrc:  Oral Oral   SpO2:   96%   Weight:      Height:        Examination:  GENERAL: No apparent distress.  Nontoxic. HEENT: MMM.  Vision and hearing grossly intact.  NECK: Supple.  No apparent JVD.  RESP:  No IWOB.  Fair aeration bilaterally. CVS:  RRR. Heart sounds normal.  ABD/GI/GU: BS+. Abd soft, NTND.  MSK/EXT:  Moves extremities. No apparent  deformity. No edema.  SKIN: no apparent skin lesion or wound NEURO: Awake, alert and oriented appropriately.  Expressive aphasia (chronic per patient).  No apparent focal neuro deficit. PSYCH: Calm. Normal affect.   Procedures:  None  Microbiology summarized: None  Assessment and plan: NSTEMI: Patient presented with lip numbness and shoulder pain which are chronic.  No report of cardiopulmonary symptoms other than some lightheadedness.  Initial troponin 808 and peaked at 1368.  EKG with nonspecific ST and T wave change.  Already on warfarin at home.  INR therapeutic.  TTE with LVEF of 65 to 70%, moderate asymmetric LVH, severe LAE and RAE and mild to moderate MR.  LDL 101. -Continue heparin  drip, aspirin , Crestor , Imdur , Zetia  -Plan for LHC once INR within acceptable range.  History of CAD/CABG in 1999 -Management as above.  Intermittent lip numbness: Unclear etiology.  Reportedly ongoing for years.  CT head without acute finding.  Seems to have chronic expressive aphasia as well.  No further numbness since hospitalization. -Continue monitoring     Chronic bilateral shoulder pain: MRI angio neck in 08/2023 without significant finding.  History of cervicalgia.  Followed by [place] [facility].  MRI cervical spine in 11/2023 showed generalized cervical spine degeneration with foraminal impingement bilaterally at C3-4 to C5-6 and on the right at C6-7. Up to mild spinal stenosis at C4-5. -Has outpatient MRI brain scheduled for 02/08/2024. -Continue Tylenol  as needed   Prolonged QT  interval: QTc 523. -Avoid QT prolonging drugs -Optimize electrolytes   Chronic thrombocytopenia-downward trending Continue to monitor platelet levels while on heparin  drip   Permanent atrial fibrillation: Rate controlled without meds.  INR therapeutic -Currently on heparin . -Continue holding warfarin   Essential hypertension: Normotensive -Continue amlodipine , ramipril  and Imdur  -Continue holding  Maxzide    Mixed hyperlipidemia -Continue Crestor  and Zetia    Acquired hypothyroidism -Continue Synthroid    BPH without LUTS -Continue doxazosin   Hypokalemia -Monitor replenish as appropriate  Body mass index is 22.22 kg/m.           DVT prophylaxis:  SCDs Start: 01/22/24 2217 On full dose anticoagulation. Code Status: Full code Family Communication: None at bedside Level of care: Telemetry Cardiac Status is: Inpatient Remains inpatient appropriate because: Non-STEMI   Final disposition: Home Consultants:  Cardiology  55 minutes with more than 50% spent in reviewing records, counseling patient/family and coordinating care.   Sch Meds:  Scheduled Meds:  amLODipine   5 mg Oral Daily   aspirin  EC  81 mg Oral Daily   doxazosin   8 mg Oral QHS   ezetimibe   10 mg Oral Daily   isosorbide  mononitrate  30 mg Oral Daily   levothyroxine   50 mcg Oral Q0600   potassium chloride   40 mEq Oral Q3H   ramipril   10 mg Oral Daily   rosuvastatin   5 mg Oral QODAY   Continuous Infusions:  heparin  1,100 Units/hr (01/23/24 1905)   PRN Meds:.acetaminophen  **OR** acetaminophen , prochlorperazine   Antimicrobials: Anti-infectives (From admission, onward)    None        I have personally reviewed the following labs and images: CBC: Recent Labs  Lab 01/22/24 1527 01/23/24 0507 01/24/24 0340  WBC 7.7 6.1 6.9  HGB 13.5 13.6 11.8*  HCT 40.4 41.9 35.3*  MCV 92.7 95.9 90.7  PLT 134* 94* 119*   BMP &GFR Recent Labs  Lab 01/22/24 1527 01/23/24 0507 01/24/24 0340  NA 134* 136 136  K 3.8 3.6 3.2*  CL 97* 101 101  CO2 26 26 27   GLUCOSE 112* 91 88  BUN 8 8 8   CREATININE 0.79 0.77 0.75  CALCIUM  9.2 8.9 8.5*  MG 2.2 2.3 2.0  PHOS  --  2.7  --    Estimated Creatinine Clearance: 61.9 mL/min (by C-G formula based on SCr of 0.75 mg/dL). Liver & Pancreas: Recent Labs  Lab 01/23/24 0507  AST 27  ALT 12  ALKPHOS 81  BILITOT 1.1  PROT 6.5  ALBUMIN 3.9   No results  for input(s): LIPASE, AMYLASE in the last 168 hours. No results for input(s): AMMONIA in the last 168 hours. Diabetic: No results for input(s): HGBA1C in the last 72 hours. No results for input(s): GLUCAP in the last 168 hours. Cardiac Enzymes: No results for input(s): CKTOTAL, CKMB, CKMBINDEX, TROPONINI in the last 168 hours. No results for input(s): PROBNP in the last 8760 hours. Coagulation Profile: Recent Labs  Lab 01/22/24 1500 01/23/24 0510 01/23/24 1332 01/24/24 0340  INR 2.0* 2.2* 2.1* 2.2*   Thyroid  Function Tests: No results for input(s): TSH, T4TOTAL, FREET4, T3FREE, THYROIDAB in the last 72 hours. Lipid Profile: Recent Labs    01/24/24 0340  CHOL 150  HDL 43  LDLCALC 101*  TRIG 32  CHOLHDL 3.5   Anemia Panel: No results for input(s): VITAMINB12, FOLATE, FERRITIN, TIBC, IRON, RETICCTPCT in the last 72 hours. Urine analysis:    Component Value Date/Time   COLORURINE YELLOW 07/31/2012 1452   APPEARANCEUR Clear 11/20/2022 0000  LABSPEC 1.009 07/31/2012 1452   PHURINE 7.0 07/31/2012 1452   GLUCOSEU Negative 11/20/2022 0000   HGBUR NEGATIVE 07/31/2012 1452   BILIRUBINUR Negative 11/20/2022 0000   KETONESUR 15 (A) 07/31/2012 1452   PROTEINUR Negative 11/20/2022 0000   PROTEINUR NEGATIVE 07/31/2012 1452   UROBILINOGEN negative 11/03/2013 1602   UROBILINOGEN 0.2 07/31/2012 1452   NITRITE Negative 11/20/2022 0000   NITRITE NEGATIVE 07/31/2012 1452   LEUKOCYTESUR Negative 11/20/2022 0000   Sepsis Labs: Invalid input(s): PROCALCITONIN, LACTICIDVEN  Microbiology: No results found for this or any previous visit (from the past 240 hours).  Radiology Studies: ECHOCARDIOGRAM COMPLETE Result Date: 01/23/2024    ECHOCARDIOGRAM REPORT   Patient Name:   Dalton Vaughan Date of Exam: 01/23/2024 Medical Rec #:  [id number]   Height:       72.0 in Accession #:    [id number]  Weight:       175.5 lb Date of Birth:  1929-11-25   BSA:           2.015 m Patient Age:    89 years    BP:           141/81 mmHg Patient Gender: M           HR:           72 bpm. Exam Location:  [facility] Procedure: 2D Echo, Cardiac Doppler and Color Doppler Indications:    NSTEMI  History:        Patient has prior history of Echocardiogram examinations, most                 recent 11/19/2018. CAD, TIA; Arrythmias:Atrial Fibrillation.  Sonographer:    Aida Pizza RCS Referring Phys: [id number] LAYMON CHRISTELLA QUA IMPRESSIONS  1. Left ventricular ejection fraction, by estimation, is 65 to 70%. The left ventricle has normal function. The left ventricle has no regional wall motion abnormalities. There is moderate asymmetric left ventricular hypertrophy of the septal segment. Left ventricular diastolic parameters are indeterminate.  2. Right ventricular systolic function is normal. The right ventricular size is normal. There is normal pulmonary artery systolic pressure. The estimated right ventricular systolic pressure is 30.8 mmHg.  3. Left atrial size was severely dilated.  4. Right atrial size was severely dilated.  5. The mitral valve is degenerative. Mild to moderate mitral valve regurgitation.  6. The aortic valve is tricuspid. There is mild calcification of the aortic valve. Aortic valve regurgitation is not visualized.  7. Aortic dilatation noted. There is mild dilatation of the aortic root, measuring 41 mm.  8. The inferior vena cava is dilated in size with >50% respiratory variability, suggesting right atrial pressure of 8 mmHg. Comparison(s): Prior images unable to be directly viewed. FINDINGS  Left Ventricle: Left ventricular ejection fraction, by estimation, is 65 to 70%. The left ventricle has normal function. The left ventricle has no regional wall motion abnormalities. The left ventricular internal cavity size was normal in size. There is  moderate asymmetric left ventricular hypertrophy of the septal segment. Left ventricular diastolic function could not be  evaluated due to atrial fibrillation. Left ventricular diastolic parameters are indeterminate. Right Ventricle: The right ventricular size is normal. No increase in right ventricular wall thickness. Right ventricular systolic function is normal. There is normal pulmonary artery systolic pressure. The tricuspid regurgitant velocity is 2.39 m/s, and  with an assumed right atrial pressure of 8 mmHg, the estimated right ventricular systolic pressure is 30.8 mmHg. Left Atrium: Left atrial size was severely  dilated. Right Atrium: Right atrial size was severely dilated. Pericardium: There is no evidence of pericardial effusion. Mitral Valve: The mitral valve is degenerative in appearance. There is mild calcification of the mitral valve leaflet(s). Mild to moderate mitral valve regurgitation. Tricuspid Valve: The tricuspid valve is grossly normal. Tricuspid valve regurgitation is mild. Aortic Valve: The aortic valve is tricuspid. There is mild calcification of the aortic valve. There is mild aortic valve annular calcification. Aortic valve regurgitation is not visualized. Pulmonic Valve: The pulmonic valve was grossly normal. Pulmonic valve regurgitation is mild to moderate. Aorta: Aortic dilatation noted. There is mild dilatation of the aortic root, measuring 41 mm. Venous: The inferior vena cava is dilated in size with greater than 50% respiratory variability, suggesting right atrial pressure of 8 mmHg. IAS/Shunts: No atrial level shunt detected by color flow Doppler. Additional Comments: A device lead is visualized.  LEFT VENTRICLE PLAX 2D LVIDd:         4.30 cm   Diastology LVIDs:         2.50 cm   LV e' medial:    7.62 cm/s LV PW:         1.20 cm   LV E/e' medial:  11.8 LV IVS:        1.40 cm   LV e' lateral:   15.00 cm/s LVOT diam:     2.20 cm   LV E/e' lateral: 6.0 LV SV:         69 LV SV Index:   34 LVOT Area:     3.80 cm  RIGHT VENTRICLE RV S prime:     12.50 cm/s TAPSE (M-mode): 1.8 cm LEFT ATRIUM               Index        RIGHT ATRIUM           Index LA diam:        5.10 cm  2.53 cm/m   RA Area:     33.20 cm LA Vol (A2C):   153.0 ml 75.92 ml/m  RA Volume:   125.00 ml 62.02 ml/m LA Vol (A4C):   169.0 ml 83.85 ml/m LA Biplane Vol: 159.0 ml 78.89 ml/m  AORTIC VALVE LVOT Vmax:   90.60 cm/s LVOT Vmean:  67.150 cm/s LVOT VTI:    0.182 m  AORTA Ao Root diam: 4.10 cm MITRAL VALVE               TRICUSPID VALVE MV Area (PHT): 3.12 cm    TR Peak grad:   22.8 mmHg MV Decel Time: 243 msec    TR Vmax:        239.00 cm/s MR Peak grad: 74.3 mmHg MR Mean grad: 43.0 mmHg    SHUNTS MR Vmax:      431.00 cm/s  Systemic VTI:  0.18 m MR Vmean:     307.0 cm/s   Systemic Diam: 2.20 cm MV E velocity: 89.80 cm/s MV A velocity: 34.30 cm/s MV E/A ratio:  2.62 Jayson Sierras MD Electronically signed by Jayson Sierras MD Signature Date/Time: 01/23/2024/2:28:07 PM    Final       Venise Ellingwood T. Latravious Levitt [facility]  If 7PM-7AM, please contact night-coverage [email or web address] 01/24/2024, 12:08 PM

## 2024-01-24 NOTE — Plan of Care (Signed)

## 2024-01-24 NOTE — Progress Notes (Addendum)
 Patient Name: Dalton Vaughan Date of Encounter: 01/24/2024 [facility] Cardiologist: Redell Leiter, MD   Interval Summary  .    89 y.o. male with a hx of CAD (s/p CABG in 1999), permanent atrial fibrillation, SSS (s/p Medtr[employer] PPM placement at [facility] in 2021), HTN, HLD, GERD and prior TIA, who was transferred from [facility] to [facility] for further evaluation of NSTEMI. He presented with dizziness, lip numbness, bilateral shoulder pain radiating to left arm at rest and with activity.  Hs trop 808 [phone]. He is on chronic coumadin  for A fib.   Patient states he has not had similar symptoms since Tuesday. He denied any chest pain, SOB. He has occasional leg swelling. He confirmed that he did want to have cardiac cath performed. He states 1999 when he had CABG he was having back pain with exertion, which is not the same as what he is having today.    Vital Signs .    Vitals:   01/23/24 1826 01/23/24 2022 01/23/24 2340 01/24/24 0614  BP:  124/68 112/72 119/71  Pulse:      Resp:  16  17  Temp:  98.4 F (36.9 C)  98.3 F (36.8 C)  TempSrc:  Oral  Oral  SpO2:      Weight: 74.3 kg     Height:        Intake/Output Summary (Last 24 hours) at 01/24/2024 0733 Last data filed at 01/23/2024 2359 Gross per 24 hour  Intake 525.64 ml  Output 1250 ml  Net -724.36 ml      01/23/2024    6:26 PM 01/22/2024    2:32 PM 01/22/2024    2:30 PM  Last 3 Weights  Weight (lbs) 163 lb 12.8 oz 175 lb 7.8 oz 175 lb 7.8 oz  Weight (kg) 74.299 kg 79.6 kg 79.6 kg      Telemetry/ECG    Intermittently V paced, appears A fib at baseline  - Personally Reviewed  Physical Exam .   GEN: No acute distress.   Neck: + JVD with HOB 30 degree  Cardiac: Irregular, grade II systolic murmur throughout  Respiratory: Clear to auscultation bilaterally. On room air.  GI: Soft, nontender, non-distended  MS: No leg edema  Assessment & Plan .     NSTEMI CAD s/p CABG in 1999 -  Presented with worsening  bilateral shoulder pain which radiates underneath his left arm and could be his anginal equivalent and this has occurred at rest or with activity. - Hs troponin values peaked at 1368 and downtrending to 1257  - EKG paced - Echo from 01/23/24 showed LVEF 65-70%, no RWMA, mod LVH, indeterminate diastolic, normal RV, PASP 30.8 mmHg, severe LAE and RAE, mild to mod MR, mild dilatation of the aortic root 41 mm, IVC dilated  - given high functional level at baseline and patient's preference, he was transferred here for LHC, transitioned from coumadin  to heparin  gtt, goal of INR <1.5 before cath, will plan for cardiac cath per patient's wish, however, medical management is reasonable given his age/discussed this matter  - Medical therapy: on heparin  gtt; add ASA 81mg  daily so far; previously intolerant to higher intensity statin therapy, on PTA crestor  5mg  every other day, LDL 101, will add zetia  10mg  today and monitor for intolerance,  discussed potential of PCSK9i/can be reviewed outpatient; intolerant to AVN block agents in the past     Permanent Atrial Fibrillation  - He has not been on AV nodal blocking agents and  heart rate has overall been well-controlled in the 70's to 80's. Previously intolerant to Cardizem and beta-blocker therapy by review of notes from [facility].  - on PTA coumadin , now on heparin  gtt, INR 2.2 today   Sick sinus syndrome -  s/p Medtr[employer] PPM placement at [facility] in 2021 - asked to interrogate device today, normal device function, measurement within normal limits, A fib RVR last on 12/05/23 lasted few seconds per rep   Chronic thrombocytopenia  - PLT 90-130k range, stable for now, would not cath if PLT <50k    For questions or updates, please contact [facility] Please consult [email or web address] for contact info under        Signed, Keela Rubert, NP

## 2024-01-25 DIAGNOSIS — R351 Nocturia: Secondary | ICD-10-CM

## 2024-01-25 DIAGNOSIS — I4821 Permanent atrial fibrillation: Secondary | ICD-10-CM | POA: Diagnosis not present

## 2024-01-25 DIAGNOSIS — N401 Enlarged prostate with lower urinary tract symptoms: Secondary | ICD-10-CM

## 2024-01-25 DIAGNOSIS — I482 Chronic atrial fibrillation, unspecified: Secondary | ICD-10-CM | POA: Diagnosis not present

## 2024-01-25 DIAGNOSIS — D696 Thrombocytopenia, unspecified: Secondary | ICD-10-CM

## 2024-01-25 DIAGNOSIS — I214 Non-ST elevation (NSTEMI) myocardial infarction: Secondary | ICD-10-CM | POA: Diagnosis not present

## 2024-01-25 DIAGNOSIS — G8929 Other chronic pain: Secondary | ICD-10-CM

## 2024-01-25 LAB — HEPARIN LEVEL (UNFRACTIONATED): Heparin Unfractionated: 0.31 [IU]/mL (ref 0.30–0.70)

## 2024-01-25 LAB — PROTIME-INR
INR: 1.7 — ABNORMAL HIGH (ref 0.8–1.2)
Prothrombin Time: 19.9 s — ABNORMAL HIGH (ref 11.4–15.2)

## 2024-01-25 LAB — BASIC METABOLIC PANEL
Anion gap: 11 (ref 5–15)
BUN: 5 mg/dL — ABNORMAL LOW (ref 8–23)
CO2: 24 mmol/L (ref 22–32)
Calcium: 9 mg/dL (ref 8.9–10.3)
Chloride: 101 mmol/L (ref 98–111)
Creatinine, Ser: 0.73 mg/dL (ref 0.61–1.24)
GFR, Estimated: >60 mL/min (ref >60)
Glucose, Bld: 92 mg/dL (ref 70–99)
Potassium: 4.1 mmol/L (ref 3.5–5.1)
Sodium: 136 mmol/L (ref 135–145)

## 2024-01-25 LAB — CBC
HCT: 35.2 % — ABNORMAL LOW (ref 39.0–52.0)
Hemoglobin: 11.8 g/dL — ABNORMAL LOW (ref 13.0–17.0)
MCH: 30.6 pg (ref 26.0–34.0)
MCHC: 33.5 g/dL (ref 30.0–36.0)
MCV: 91.2 fL (ref 80.0–100.0)
Platelets: 117 10*3/uL — ABNORMAL LOW (ref 150–400)
RBC: 3.86 MIL/uL — ABNORMAL LOW (ref 4.22–5.81)
RDW: 14.8 % (ref 11.5–15.5)
WBC: 6.5 10*3/uL (ref 4.0–10.5)
nRBC: 0 % (ref 0.0–0.2)

## 2024-01-25 LAB — MAGNESIUM: Magnesium: 2 mg/dL (ref 1.7–2.4)

## 2024-01-25 MED ORDER — ROSUVASTATIN CALCIUM 5 MG PO TABS
10.0000 mg | ORAL_TABLET | ORAL | Status: DC
  Administered 2024-01-26 – 2024-01-28 (×2): 10 mg via ORAL
  Filled 2024-01-25 (×2): qty 2

## 2024-01-25 MED ORDER — NITROGLYCERIN 0.4 MG SL SUBL
0.4000 mg | SUBLINGUAL_TABLET | SUBLINGUAL | Status: DC | PRN
  Administered 2024-01-25 – 2024-01-26 (×2): 0.4 mg via SUBLINGUAL
  Filled 2024-01-25: qty 1

## 2024-01-25 MED ORDER — PANTOPRAZOLE SODIUM 40 MG PO TBEC
40.0000 mg | DELAYED_RELEASE_TABLET | Freq: Every day | ORAL | Status: DC
  Administered 2024-01-25 – 2024-01-28 (×4): 40 mg via ORAL
  Filled 2024-01-25 (×4): qty 1

## 2024-01-25 MED ORDER — MIRTAZAPINE 7.5 MG PO TABS
3.7500 mg | ORAL_TABLET | Freq: Once | ORAL | Status: AC
  Administered 2024-01-25: 3.75 mg via ORAL
  Filled 2024-01-25: qty 1

## 2024-01-25 MED ORDER — SIMETHICONE 80 MG PO CHEW
40.0000 mg | CHEWABLE_TABLET | Freq: Four times a day (QID) | ORAL | Status: DC | PRN
  Administered 2024-01-25 – 2024-01-26 (×3): 40 mg via ORAL
  Filled 2024-01-25 (×3): qty 1

## 2024-01-25 NOTE — Plan of Care (Signed)
  Problem: Education: Goal: Knowledge of General Education information will improve Description: Including pain rating scale, medication(s)/side effects and non-pharmacologic comfort measures Outcome: Progressing   Problem: Health Behavior/Discharge Planning: Goal: Ability to manage health-related needs will improve Outcome: Progressing   Problem: Clinical Measurements: Goal: Ability to maintain clinical measurements within normal limits will improve Outcome: Progressing Goal: Will remain free from infection Outcome: Progressing Goal: Diagnostic test results will improve Outcome: Progressing Goal: Respiratory complications will improve Outcome: Not Applicable Goal: Cardiovascular complication will be avoided Outcome: Progressing   Problem: Activity: Goal: Risk for activity intolerance will decrease Outcome: Progressing   Problem: Nutrition: Goal: Adequate nutrition will be maintained Outcome: Progressing   Problem: Coping: Goal: Level of anxiety will decrease Outcome: Progressing   Problem: Elimination: Goal: Will not experience complications related to bowel motility Outcome: Progressing Goal: Will not experience complications related to urinary retention Outcome: Progressing   Problem: Pain Managment: Goal: General experience of comfort will improve and/or be controlled Outcome: Progressing   Problem: Safety: Goal: Ability to remain free from injury will improve Outcome: Progressing   Problem: Skin Integrity: Goal: Risk for impaired skin integrity will decrease Outcome: Progressing

## 2024-01-25 NOTE — Progress Notes (Addendum)
 PHARMACY - ANTICOAGULATION CONSULT NOTE  Pharmacy Consult for heparin  Indication: chest pain/ACS  Allergies  Allergen Reactions   Paxil [Paroxetine Hcl] Palpitations   Codeine Other (See Comments)   Eliquis  [Apixaban ] Other (See Comments)    dizziness   Pravachol [Pravastatin Sodium] Other (See Comments)    myalgias   Zetia  [Ezetimibe ] Other (See Comments)    myalgias   Penicillins Hives, Rash and Other (See Comments)    Happened in 1959   Vytorin [Ezetimibe -Simvastatin] Other (See Comments)    myalgias   Patient Measurements: Height: 6' (182.9 cm) Weight: 74.3 kg (163 lb 12.8 oz) IBW/kg (Calculated) : 77.6 Heparin  Dosing Weight: 79.6 kg  Vital Signs: Temp: 98.2 F (36.8 C) (02/08 0335) Temp Source: Oral (02/08 0335) BP: 118/66 (02/08 0922) Pulse Rate: 83 (02/08 0335)  Labs: Recent Labs    01/22/24 1808 01/22/24 2224 01/23/24 0009 01/23/24 0507 01/23/24 0510 01/23/24 1332 01/23/24 1936 01/24/24 0340 01/25/24 0325  HGB  --   --   --  13.6  --   --   --  11.8* 11.8*  HCT  --   --   --  41.9  --   --   --  35.3* 35.2*  PLT  --   --   --  94*  --   --   --  119* 117*  LABPROT  --   --   --   --    < > 23.5*  --  24.9* 19.9*  INR  --   --   --   --    < > 2.1*  --  2.2* 1.7*  HEPARINUNFRC  --   --   --   --    < > 0.32 0.32 0.36 0.31  CREATININE  --   --   --  0.77  --   --   --  0.75 0.73  TROPONINIHS 876* 1,368* 1,257*  --   --   --   --   --   --    < > = values in this interval not displayed.    Estimated Creatinine Clearance: 61.9 mL/min (by C-G formula based on SCr of 0.73 mg/dL).   Medical History: Past Medical History:  Diagnosis Date   Anal fissure    Arthritis    Atrial fibrillation (HCC)    BPH (benign prostatic hypertrophy)    CAD (coronary artery disease)    Dr. Blanca   Cataract    COVID-19    Depression 2004   Diverticulosis    GERD (gastroesophageal reflux disease)    History of TIAs    Hyperlipidemia    Hypertension     Hypertensive heart disease without CHF    Hypothyroidism    Internal hemorrhoids    Lumbar disc disease    Oral bleeding 08/20/2012   Following molar tooth extraction July, 2013   Pneumonia    Ruptured disk 1995   Shingles    Thyroid  disease    Vitamin D  deficiency    Vitreous detachment    Assessment: 89 y/o male presenting with numbness around lips and dizziness. PMH significant for sick sinus syndrome status post pacemaker, atrial fibrillation on warfarin, and CAD status post CABG. In ED, troponin level found to be elevated. Pharmacy has been consulted to initiate heparin  infusion.  - heparin  level 0.36 is therapeutic on heparin  1100 units/hr.   -plt= 119 (history of low plt) -plans for cath when INR < 1.7  Home warfarin regimen: 5  mg daily on Wed & Fri, 2.5 mg all other days (Total weekly dose of 22.5 mg) Last dose: 2.5 mg on evening of 01/21/24  2/8 AM: Heparin  level 0.31, borderline subtherapeutic and trending down on heparin  1100 units/hr. No issues with infusion running or signs of bleeding per RN. CBC stable (hgb 11.8, PLT 117).  INR trending down to 1.7 today.   Goal of Therapy:  Heparin  level 0.3-0.7 units/ml Monitor platelets by anticoagulation protocol: Yes   Plan:  Increase Heparin  at 1150/hr due to down-trending heparin  anti-Xa levels Recheck heparin  level in AM Daily heparin  level, PT/INR and CBC  Morna Breach, PharmD PGY-1 Acute Care Pharmacy Resident 01/25/2024 9:33 AM

## 2024-01-25 NOTE — Progress Notes (Signed)
 PROGRESS NOTE  Dalton Vaughan FMW:[id number] DOB: 1929/07/26   PCP: Jolinda Norene HERO, DO  Patient is from: Home.  Independently ambulates at baseline.  DOA: 01/22/2024 LOS: 2  Chief complaints Chief Complaint  Patient presents with   Dizziness     Brief Narrative / Interim history: 89 year old M with PMH of A-fib on warfarin, SSS/PPM, CAD/CABG in 1999, HTN, BPH, hypothyroidism, cervicalgia and chronic bilateral shoulder pain presented to [facility] with numbness sensation around his lips, and found to have non-STEMI.  No report of chest pain or dyspnea but some lightheadedness associated with numbness.  Reportedly had intermittent numbness for 5 to 6 years.  Also chronic bilateral shoulder pain on and off.  He is followed by neurosurgery for his cervicalgia.   In ED, troponin elevated to 808 and trended up to 1368.  EKG rate controlled A-fib with nonspecific ST and T wave changes.  CXR with cardiac enlargement and small left pleural effusion.  CT head without acute finding.  Cardiology consulted.  Started on heparin  drip.  Transferred to [facility] for heart catheterization when INR improves.  Subjective: Seen and examined earlier this morning.  Reportedly had an episode of 6/10 chest pain and left shoulder pain overnight.  Patient denies chest pain but admits to left shoulder pain.  He attributes the pain to gas.  He is EKG showed A-fib without acute ischemic finding.  He is currently pain-free.  Objective: Vitals:   01/25/24 0409 01/25/24 0420 01/25/24 0436 01/25/24 0922  BP: (!) 158/89 117/66 (!) 143/89 118/66  Pulse:      Resp:      Temp:      TempSrc:      SpO2:      Weight:      Height:        Examination:  GENERAL: No apparent distress.  Nontoxic. HEENT: MMM.  Vision and hearing grossly intact.  NECK: Supple.  No apparent JVD.  RESP:  No IWOB.  Fair aeration bilaterally. CVS:  RRR. Heart sounds normal.  ABD/GI/GU: BS+. Abd soft, NTND.  MSK/EXT:  Moves  extremities. No apparent deformity. No edema.  FROM in both shoulders. SKIN: no apparent skin lesion or wound NEURO: Awake, alert and oriented appropriately.  Expressive aphasia (chronic per patient).  No apparent focal neuro deficit. PSYCH: Calm. Normal affect.   Procedures:  None  Microbiology summarized: None  Assessment and plan: NSTEMI: Patient presented with lip numbness and shoulder pain which are chronic.  No report of cardiopulmonary symptoms other than some lightheadedness.  Initial troponin 808 and peaked at 1368.  EKG with nonspecific ST and T wave change.  Already on warfarin at home.  INR therapeutic.  TTE with LVEF of 65 to 70%, moderate asymmetric LVH, severe LAE and RAE and mild to moderate MR.  LDL 101. -Continue heparin  drip, aspirin , Crestor , Imdur , Zetia  -Plan for LHC once INR within acceptable range.  History of CAD/CABG in 1999 -Management as above.  Intermittent lip numbness: Unclear etiology.  Reportedly ongoing for years.  CT head without acute finding.  Seems to have chronic expressive aphasia as well.  No further numbness since hospitalization. -Continue monitoring   Chronic bilateral shoulder pain: MRI angio neck in 08/2023 without significant finding.  History of cervicalgia.  Followed by [place] [facility].  MRI cervical spine in 11/2023 showed generalized cervical spine degeneration with foraminal impingement bilaterally at C3-4 to C5-6 and on the right at C6-7. Up to mild spinal stenosis at C4-5. -Has outpatient MRI  brain scheduled for 02/08/2024. -Continue Tylenol  as needed   Prolonged QT interval: QTc 523> 420. -Avoid QT prolonging drugs -Optimize electrolytes   Chronic thrombocytopenia-downward trending Continue to monitor platelet levels while on heparin  drip   Permanent atrial fibrillation: Rate controlled without meds.  INR therapeutic -Currently on heparin . -Continue holding warfarin   Essential hypertension: Normotensive -Continue  amlodipine , ramipril  and Imdur  -Continue holding Maxzide    Mixed hyperlipidemia -Continue Crestor  and Zetia    Acquired hypothyroidism -Continue Synthroid    BPH without LUTS -Continue doxazosin   Hypokalemia -Monitor replenish as appropriate  GERD/gas? -Start PPI, Gas-X and bowel regimen  Body mass index is 22.22 kg/m.           DVT prophylaxis:  SCDs Start: 01/22/24 2217 On full dose anticoagulation. Code Status: Full code Family Communication: None at bedside Level of care: Telemetry Cardiac Status is: Inpatient Remains inpatient appropriate because: Non-STEMI   Final disposition: Home Consultants:  Cardiology  35 minutes with more than 50% spent in reviewing records, counseling patient/family and coordinating care.   Sch Meds:  Scheduled Meds:  amLODipine   5 mg Oral Daily   aspirin  EC  81 mg Oral Daily   doxazosin   8 mg Oral QHS   ezetimibe   10 mg Oral Daily   isosorbide  mononitrate  30 mg Oral Daily   levothyroxine   50 mcg Oral Q0600   pantoprazole   40 mg Oral Daily   ramipril   10 mg Oral Daily   [START ON 01/26/2024] rosuvastatin   10 mg Oral QODAY   senna  1 tablet Oral QHS   Continuous Infusions:  heparin  1,150 Units/hr (01/25/24 1009)   PRN Meds:.acetaminophen  **OR** acetaminophen , nitroGLYCERIN , polyethylene glycol, prochlorperazine , simethicone   Antimicrobials: Anti-infectives (From admission, onward)    None        I have personally reviewed the following labs and images: CBC: Recent Labs  Lab 01/22/24 1527 01/23/24 0507 01/24/24 0340 01/25/24 0325  WBC 7.7 6.1 6.9 6.5  HGB 13.5 13.6 11.8* 11.8*  HCT 40.4 41.9 35.3* 35.2*  MCV 92.7 95.9 90.7 91.2  PLT 134* 94* 119* 117*   BMP &GFR Recent Labs  Lab 01/22/24 1527 01/23/24 0507 01/24/24 0340 01/25/24 0325  NA 134* 136 136 136  K 3.8 3.6 3.2* 4.1  CL 97* 101 101 101  CO2 26 26 27 24   GLUCOSE 112* 91 88 92  BUN 8 8 8  5*  CREATININE 0.79 0.77 0.75 0.73  CALCIUM  9.2 8.9  8.5* 9.0  MG 2.2 2.3 2.0 2.0  PHOS  --  2.7  --   --    Estimated Creatinine Clearance: 61.9 mL/min (by C-G formula based on SCr of 0.73 mg/dL). Liver & Pancreas: Recent Labs  Lab 01/23/24 0507  AST 27  ALT 12  ALKPHOS 81  BILITOT 1.1  PROT 6.5  ALBUMIN 3.9   No results for input(s): LIPASE, AMYLASE in the last 168 hours. No results for input(s): AMMONIA in the last 168 hours. Diabetic: No results for input(s): HGBA1C in the last 72 hours. No results for input(s): GLUCAP in the last 168 hours. Cardiac Enzymes: No results for input(s): CKTOTAL, CKMB, CKMBINDEX, TROPONINI in the last 168 hours. No results for input(s): PROBNP in the last 8760 hours. Coagulation Profile: Recent Labs  Lab 01/22/24 1500 01/23/24 0510 01/23/24 1332 01/24/24 0340 01/25/24 0325  INR 2.0* 2.2* 2.1* 2.2* 1.7*   Thyroid  Function Tests: No results for input(s): TSH, T4TOTAL, FREET4, T3FREE, THYROIDAB in the last 72 hours. Lipid Profile: Recent Labs  01/24/24 0340  CHOL 150  HDL 43  LDLCALC 101*  TRIG 32  CHOLHDL 3.5   Anemia Panel: No results for input(s): VITAMINB12, FOLATE, FERRITIN, TIBC, IRON, RETICCTPCT in the last 72 hours. Urine analysis:    Component Value Date/Time   COLORURINE YELLOW 07/31/2012 1452   APPEARANCEUR Clear 11/20/2022 0000   LABSPEC 1.009 07/31/2012 1452   PHURINE 7.0 07/31/2012 1452   GLUCOSEU Negative 11/20/2022 0000   HGBUR NEGATIVE 07/31/2012 1452   BILIRUBINUR Negative 11/20/2022 0000   KETONESUR 15 (A) 07/31/2012 1452   PROTEINUR Negative 11/20/2022 0000   PROTEINUR NEGATIVE 07/31/2012 1452   UROBILINOGEN negative 11/03/2013 1602   UROBILINOGEN 0.2 07/31/2012 1452   NITRITE Negative 11/20/2022 0000   NITRITE NEGATIVE 07/31/2012 1452   LEUKOCYTESUR Negative 11/20/2022 0000   Sepsis Labs: Invalid input(s): PROCALCITONIN, LACTICIDVEN  Microbiology: No results found for this or any previous visit (from  the past 240 hours).  Radiology Studies: No results found.     Yeilin Zweber T. Katsumi Wisler [facility]  If 7PM-7AM, please contact night-coverage [email or web address] 01/25/2024, 2:11 PM

## 2024-01-25 NOTE — Progress Notes (Signed)
 01/25/24 ~4:00 pt c/o of 6/10 sharp/tight pain in L shoulder blade area & also felt like he had gas that he can't relieve. BP 158/89(108).    Pt placed on 4L NCO2. SL nitro x1 given>pain decreased to 4/10 pain that he described as duller. After 1 SL nitroglycerin  BP decreased to 117/66(81) but quickly recovered   EKG obtained & placed in chart & Opyd, MD notified. Order for PRN simethicone  received & administered. Pt now resting comfortably.

## 2024-01-25 NOTE — Progress Notes (Signed)
   Rounding Note    Patient Name: Dalton Vaughan Date of Encounter: 01/25/2024  [facility] Cardiologist: Redell Leiter, MD   Subjective   No acute events overnight.  Feeling well.  No chest pain.  Vital Signs    Vitals:   01/25/24 0335 01/25/24 0409 01/25/24 0420 01/25/24 0436  BP: (!) 152/85 (!) 158/89 117/66 (!) 143/89  Pulse: 83     Resp: 16     Temp: 98.2 F (36.8 C)     TempSrc: Oral     SpO2: 95%     Weight:      Height:        Intake/Output Summary (Last 24 hours) at 01/25/2024 0752 Last data filed at 01/25/2024 0336 Gross per 24 hour  Intake 718 ml  Output 1075 ml  Net -357 ml      01/23/2024    6:26 PM 01/22/2024    2:32 PM 01/22/2024    2:30 PM  Last 3 Weights  Weight (lbs) 163 lb 12.8 oz 175 lb 7.8 oz 175 lb 7.8 oz  Weight (kg) 74.299 kg 79.6 kg 79.6 kg      Telemetry    Intermittent ventricular pacing- Personally Reviewed  ECG    Personally Reviewed  Physical Exam   GEN: No acute distress..  Elderly Cardiac: RRR, no murmurs, rubs, or gallops.  Respiratory: Clear to auscultation bilaterally. Psych: Normal affect   Assessment & Plan    #NSTEMI Chest pain-free and hemodynamically stable INR trending down in preparation for left heart catheterization Monday Continue heparin  drip Continue aspirin   continue Zetia   continue Crestor , increase to 10 mg daily  #Permanent atrial fibrillation #Tachybradycardia syndrome #Permanent pacemaker in situ Device functioning appropriately. Heparin  as above  Keep n.p.o. Sunday night.   Ole T. Cindie, MD, [facility], [facility] Cardiac Electrophysiology

## 2024-01-26 DIAGNOSIS — I482 Chronic atrial fibrillation, unspecified: Secondary | ICD-10-CM | POA: Diagnosis not present

## 2024-01-26 DIAGNOSIS — N401 Enlarged prostate with lower urinary tract symptoms: Secondary | ICD-10-CM | POA: Diagnosis not present

## 2024-01-26 DIAGNOSIS — I214 Non-ST elevation (NSTEMI) myocardial infarction: Secondary | ICD-10-CM | POA: Diagnosis not present

## 2024-01-26 DIAGNOSIS — I4821 Permanent atrial fibrillation: Secondary | ICD-10-CM | POA: Diagnosis not present

## 2024-01-26 LAB — BASIC METABOLIC PANEL
Anion gap: 9 (ref 5–15)
BUN: <5 mg/dL — ABNORMAL LOW (ref 8–23)
CO2: 25 mmol/L (ref 22–32)
Calcium: 9.2 mg/dL (ref 8.9–10.3)
Chloride: 103 mmol/L (ref 98–111)
Creatinine, Ser: 0.78 mg/dL (ref 0.61–1.24)
GFR, Estimated: >60 mL/min (ref >60)
Glucose, Bld: 100 mg/dL — ABNORMAL HIGH (ref 70–99)
Potassium: 4.2 mmol/L (ref 3.5–5.1)
Sodium: 137 mmol/L (ref 135–145)

## 2024-01-26 LAB — CBC
HCT: 38.2 % — ABNORMAL LOW (ref 39.0–52.0)
Hemoglobin: 12.9 g/dL — ABNORMAL LOW (ref 13.0–17.0)
MCH: 30.5 pg (ref 26.0–34.0)
MCHC: 33.8 g/dL (ref 30.0–36.0)
MCV: 90.3 fL (ref 80.0–100.0)
Platelets: 121 10*3/uL — ABNORMAL LOW (ref 150–400)
RBC: 4.23 MIL/uL (ref 4.22–5.81)
RDW: 14.7 % (ref 11.5–15.5)
WBC: 7 10*3/uL (ref 4.0–10.5)
nRBC: 0 % (ref 0.0–0.2)

## 2024-01-26 LAB — HEPARIN LEVEL (UNFRACTIONATED): Heparin Unfractionated: 0.34 [IU]/mL (ref 0.30–0.70)

## 2024-01-26 LAB — MAGNESIUM: Magnesium: 2 mg/dL (ref 1.7–2.4)

## 2024-01-26 LAB — PROTIME-INR
INR: 1.3 — ABNORMAL HIGH (ref 0.8–1.2)
Prothrombin Time: 16.4 s — ABNORMAL HIGH (ref 11.4–15.2)

## 2024-01-26 MED ORDER — ASPIRIN 81 MG PO TBEC: 81.0000 mg | DELAYED_RELEASE_TABLET | Freq: Every day | ORAL | Status: DC

## 2024-01-26 MED ORDER — SIMETHICONE 80 MG PO CHEW
160.0000 mg | CHEWABLE_TABLET | Freq: Three times a day (TID) | ORAL | Status: DC | PRN
  Administered 2024-01-26 – 2024-01-28 (×5): 160 mg via ORAL
  Filled 2024-01-26 (×5): qty 2

## 2024-01-26 MED ORDER — OXYCODONE HCL 5 MG PO TABS: 2.5000 mg | ORAL_TABLET | Freq: Once | ORAL | Status: DC | PRN

## 2024-01-26 MED ORDER — SODIUM CHLORIDE 0.9 % WEIGHT BASED INFUSION
3.0000 mL/kg/h | INTRAVENOUS | Status: AC
  Administered 2024-01-27: 3 mL/kg/h via INTRAVENOUS

## 2024-01-26 MED ORDER — SODIUM CHLORIDE 0.9 % WEIGHT BASED INFUSION
1.0000 mL/kg/h | INTRAVENOUS | Status: AC
  Administered 2024-01-27: 1 mL/kg/h via INTRAVENOUS

## 2024-01-26 MED ORDER — ASPIRIN 81 MG PO CHEW
81.0000 mg | CHEWABLE_TABLET | ORAL | Status: AC
  Administered 2024-01-27: 81 mg via ORAL
  Filled 2024-01-26: qty 1

## 2024-01-26 NOTE — Plan of Care (Signed)
  Problem: Education: Goal: Knowledge of General Education information will improve Description: Including pain rating scale, medication(s)/side effects and non-pharmacologic comfort measures Outcome: Progressing   Problem: Health Behavior/Discharge Planning: Goal: Ability to manage health-related needs will improve Outcome: Progressing   Problem: Clinical Measurements: Goal: Ability to maintain clinical measurements within normal limits will improve Outcome: Progressing Goal: Will remain free from infection Outcome: Progressing Goal: Cardiovascular complication will be avoided Outcome: Progressing   Problem: Activity: Goal: Risk for activity intolerance will decrease Outcome: Progressing   Problem: Elimination: Goal: Will not experience complications related to urinary retention Outcome: Progressing   Problem: Safety: Goal: Ability to remain free from injury will improve Outcome: Progressing   Problem: Skin Integrity: Goal: Risk for impaired skin integrity will decrease Outcome: Progressing   Problem: Education: Goal: Understanding of cardiac disease, CV risk reduction, and recovery process will improve Outcome: Progressing   Problem: Activity: Goal: Ability to tolerate increased activity will improve Outcome: Progressing   Problem: Cardiac: Goal: Ability to achieve and maintain adequate cardiovascular perfusion will improve Outcome: Progressing   Problem: Health Behavior/Discharge Planning: Goal: Ability to safely manage health-related needs after discharge will improve Outcome: Progressing

## 2024-01-26 NOTE — Progress Notes (Signed)
 Mobility Specialist Progress Note:    01/26/24 1455  Mobility  Activity Ambulated with assistance in hallway  Level of Assistance Standby assist, set-up cues, supervision of patient - no hands on  Assistive Device Other (Comment) (IV pole)  Distance Ambulated (ft) 375 ft  Activity Response Tolerated well  Mobility Referral Yes  Mobility visit 1 Mobility  Mobility Specialist Start Time (ACUTE ONLY) 1445  Mobility Specialist Stop Time (ACUTE ONLY) 1455  Mobility Specialist Time Calculation (min) (ACUTE ONLY) 10 min   Pt received in bed, agreeable to mobility session. Ambulated in hallway with SV and IV pole. Tolerated well, no LOB and no complaints. Returned pt to room, left with all needs met, call bell in reach.    Khila Papp Mobility Specialist Please contact via Special Educational Needs Teacher or  Rehab office at [phone]

## 2024-01-26 NOTE — Progress Notes (Signed)
 PROGRESS NOTE  Dalton Vaughan FMW:[id number] DOB: 1929/03/08   PCP: Jolinda Norene HERO, DO  Patient is from: Home.  Independently ambulates at baseline.  DOA: 01/22/2024 LOS: 3  Chief complaints Chief Complaint  Patient presents with   Dizziness     Brief Narrative / Interim history: 89 year old M with PMH of A-fib on warfarin, SSS/PPM, CAD/CABG in 1999, HTN, BPH, hypothyroidism, cervicalgia and chronic bilateral shoulder pain presented to [facility] with numbness sensation around his lips, and found to have non-STEMI.  No report of chest pain or dyspnea but some lightheadedness associated with numbness.  Reportedly had intermittent numbness for 5 to 6 years.  Also chronic bilateral shoulder pain on and off.  He is followed by neurosurgery for his cervicalgia.   In ED, troponin elevated to 808 and trended up to 1368.  EKG rate controlled A-fib with nonspecific ST and T wave changes.  CXR with cardiac enlargement and small left pleural effusion.  CT head without acute finding.  Cardiology consulted.  Started on heparin  drip.  Transferred to [facility] for heart catheterization which is planned for 2/10.  Subjective: Seen and examined earlier this morning.  He reports another episode of what he calls a gas attack last night.  He denies chest pain or abdominal pain.  Pain is mainly in left shoulder.  He also feels some palpitation.  Currently without any discomfort.  Objective: Vitals:   01/25/24 2144 01/26/24 0510 01/26/24 0639 01/26/24 0842  BP: 126/83 (!) 158/83  132/81  Pulse:   71   Resp:      Temp:   98.1 F (36.7 C)   TempSrc:   Oral   SpO2:   96%   Weight:      Height:        Examination:  GENERAL: No apparent distress.  Nontoxic. HEENT: MMM.  Vision and hearing grossly intact.  NECK: Supple.  No apparent JVD.  RESP:  No IWOB.  Fair aeration bilaterally. CVS: Irregular rhythm.  Normal rate.  Heart sounds normal.  ABD/GI/GU: BS+. Abd soft, NTND.  MSK/EXT:  Moves  extremities. No apparent deformity. No edema.  FROM in both shoulders. SKIN: no apparent skin lesion or wound NEURO: Awake, alert and oriented appropriately.  Expressive aphasia (chronic per patient).  No apparent focal neuro deficit. PSYCH: Calm. Normal affect.   Procedures:  None  Microbiology summarized: None  Assessment and plan: NSTEMI: Patient presented with lip numbness and shoulder pain which are chronic.  No report of cardiopulmonary symptoms other than some lightheadedness.  Initial troponin 808 and peaked at 1368.  EKG with nonspecific ST and T wave change.  Already on warfarin at home.  INR therapeutic.  TTE with LVEF of 65 to 70%, moderate asymmetric LVH, severe LAE and RAE and mild to moderate MR.  LDL 101. -Continue heparin  drip, aspirin , Crestor , Imdur , Zetia  -Plan for LHC on 2/10.  History of CAD/CABG in 1999 -Management as above.  Intermittent lip numbness: Unclear etiology.  Reportedly ongoing for years.  CT head without acute finding.  Seems to have chronic expressive aphasia as well.  No further numbness since hospitalization. -Continue monitoring   Chronic bilateral shoulder pain: MRI angio neck in 08/2023 without significant finding.  History of cervicalgia.  Followed by [place] [facility].  MRI cervical spine in 11/2023 showed generalized cervical spine degeneration with foraminal impingement bilaterally at C3-4 to C5-6 and on the right at C6-7. Up to mild spinal stenosis at C4-5. -Has outpatient MRI brain scheduled  for 02/08/2024. -Continue Tylenol  as needed   Prolonged QT interval: QTc 523> 420. -Avoid QT prolonging drugs -Optimize electrolytes   Chronic thrombocytopenia-stable. -Continue monitoring.    Permanent atrial fibrillation/tachybradycardia syndrome: To have intermittent palpitation.  INR 1.3 this morning -Currently on heparin . -Continue holding warfarin   Essential hypertension: Normotensive -Continue amlodipine , ramipril  and Imdur  -Continue  holding Maxzide    Mixed hyperlipidemia -Continue Crestor  and Zetia    Acquired hypothyroidism -Continue Synthroid    BPH without LUTS -Continue doxazosin   Hypokalemia -Monitor replenish as appropriate  GERD/gas? -Start PPI, Gas-X and bowel regimen  Body mass index is 22.22 kg/m.           DVT prophylaxis:  SCDs Start: 01/22/24 2217 On full dose anticoagulation. Code Status: Full code Family Communication: None at bedside Level of care: Telemetry Cardiac Status is: Inpatient Remains inpatient appropriate because: Non-STEMI   Final disposition: Home Consultants:  Cardiology  35 minutes with more than 50% spent in reviewing records, counseling patient/family and coordinating care.   Sch Meds:  Scheduled Meds:  amLODipine   5 mg Oral Daily   aspirin  EC  81 mg Oral Daily   doxazosin   8 mg Oral QHS   ezetimibe   10 mg Oral Daily   isosorbide  mononitrate  30 mg Oral Daily   levothyroxine   50 mcg Oral Q0600   pantoprazole   40 mg Oral Daily   ramipril   10 mg Oral Daily   rosuvastatin   10 mg Oral QODAY   senna  1 tablet Oral QHS   Continuous Infusions:  heparin  1,150 Units/hr (01/25/24 1846)   PRN Meds:.acetaminophen  **OR** acetaminophen , nitroGLYCERIN , polyethylene glycol, prochlorperazine , simethicone   Antimicrobials: Anti-infectives (From admission, onward)    None        I have personally reviewed the following labs and images: CBC: Recent Labs  Lab 01/22/24 1527 01/23/24 0507 01/24/24 0340 01/25/24 0325 01/26/24 0758  WBC 7.7 6.1 6.9 6.5 7.0  HGB 13.5 13.6 11.8* 11.8* 12.9*  HCT 40.4 41.9 35.3* 35.2* 38.2*  MCV 92.7 95.9 90.7 91.2 90.3  PLT 134* 94* 119* 117* 121*   BMP &GFR Recent Labs  Lab 01/22/24 1527 01/23/24 0507 01/24/24 0340 01/25/24 0325 01/26/24 0758  NA 134* 136 136 136 137  K 3.8 3.6 3.2* 4.1 4.2  CL 97* 101 101 101 103  CO2 26 26 27 24 25   GLUCOSE 112* 91 88 92 100*  BUN 8 8 8  5* <5*  CREATININE 0.79 0.77 0.75 0.73  0.78  CALCIUM  9.2 8.9 8.5* 9.0 9.2  MG 2.2 2.3 2.0 2.0 2.0  PHOS  --  2.7  --   --   --    Estimated Creatinine Clearance: 61.9 mL/min (by C-G formula based on SCr of 0.78 mg/dL). Liver & Pancreas: Recent Labs  Lab 01/23/24 0507  AST 27  ALT 12  ALKPHOS 81  BILITOT 1.1  PROT 6.5  ALBUMIN 3.9   No results for input(s): LIPASE, AMYLASE in the last 168 hours. No results for input(s): AMMONIA in the last 168 hours. Diabetic: No results for input(s): HGBA1C in the last 72 hours. No results for input(s): GLUCAP in the last 168 hours. Cardiac Enzymes: No results for input(s): CKTOTAL, CKMB, CKMBINDEX, TROPONINI in the last 168 hours. No results for input(s): PROBNP in the last 8760 hours. Coagulation Profile: Recent Labs  Lab 01/23/24 0510 01/23/24 1332 01/24/24 0340 01/25/24 0325 01/26/24 0758  INR 2.2* 2.1* 2.2* 1.7* 1.3*   Thyroid  Function Tests: No results for input(s): TSH, T4TOTAL, FREET4,  T3FREE, THYROIDAB in the last 72 hours. Lipid Profile: Recent Labs    01/24/24 0340  CHOL 150  HDL 43  LDLCALC 101*  TRIG 32  CHOLHDL 3.5   Anemia Panel: No results for input(s): VITAMINB12, FOLATE, FERRITIN, TIBC, IRON, RETICCTPCT in the last 72 hours. Urine analysis:    Component Value Date/Time   COLORURINE YELLOW 07/31/2012 1452   APPEARANCEUR Clear 11/20/2022 0000   LABSPEC 1.009 07/31/2012 1452   PHURINE 7.0 07/31/2012 1452   GLUCOSEU Negative 11/20/2022 0000   HGBUR NEGATIVE 07/31/2012 1452   BILIRUBINUR Negative 11/20/2022 0000   KETONESUR 15 (A) 07/31/2012 1452   PROTEINUR Negative 11/20/2022 0000   PROTEINUR NEGATIVE 07/31/2012 1452   UROBILINOGEN negative 11/03/2013 1602   UROBILINOGEN 0.2 07/31/2012 1452   NITRITE Negative 11/20/2022 0000   NITRITE NEGATIVE 07/31/2012 1452   LEUKOCYTESUR Negative 11/20/2022 0000   Sepsis Labs: Invalid input(s): PROCALCITONIN, LACTICIDVEN  Microbiology: No results found  for this or any previous visit (from the past 240 hours).  Radiology Studies: No results found.     Harlin Mazzoni T. Fabianna Keats [facility]  If 7PM-7AM, please contact night-coverage [email or web address] 01/26/2024, 11:35 AM

## 2024-01-26 NOTE — Progress Notes (Signed)
 PHARMACY - ANTICOAGULATION CONSULT NOTE  Pharmacy Consult for heparin  Indication: chest pain/ACS  Allergies  Allergen Reactions   Paxil [Paroxetine Hcl] Palpitations   Codeine Other (See Comments)   Eliquis  [Apixaban ] Other (See Comments)    dizziness   Pravachol [Pravastatin Sodium] Other (See Comments)    myalgias   Zetia  [Ezetimibe ] Other (See Comments)    myalgias   Penicillins Hives, Rash and Other (See Comments)    Happened in 1959   Vytorin [Ezetimibe -Simvastatin] Other (See Comments)    myalgias   Patient Measurements: Height: 6' (182.9 cm) Weight: 74.3 kg (163 lb 12.8 oz) IBW/kg (Calculated) : 77.6 Heparin  Dosing Weight: 79.6 kg  Vital Signs: Temp: 98.1 F (36.7 C) (02/09 0639) Temp Source: Oral (02/09 0639) BP: 132/81 (02/09 0842) Pulse Rate: 71 (02/09 0639)  Labs: Recent Labs    01/24/24 0340 01/25/24 0325 01/26/24 0758  HGB 11.8* 11.8* 12.9*  HCT 35.3* 35.2* 38.2*  PLT 119* 117* 121*  LABPROT 24.9* 19.9* 16.4*  INR 2.2* 1.7* 1.3*  HEPARINUNFRC 0.36 0.31 0.34  CREATININE 0.75 0.73 0.78    Estimated Creatinine Clearance: 61.9 mL/min (by C-G formula based on SCr of 0.78 mg/dL).   Medical History: Past Medical History:  Diagnosis Date   Anal fissure    Arthritis    Atrial fibrillation (HCC)    BPH (benign prostatic hypertrophy)    CAD (coronary artery disease)    Dr. Blanca   Cataract    COVID-19    Depression 2004   Diverticulosis    GERD (gastroesophageal reflux disease)    History of TIAs    Hyperlipidemia    Hypertension    Hypertensive heart disease without CHF    Hypothyroidism    Internal hemorrhoids    Lumbar disc disease    Oral bleeding 08/20/2012   Following molar tooth extraction July, 2013   Pneumonia    Ruptured disk 1995   Shingles    Thyroid  disease    Vitamin D  deficiency    Vitreous detachment    Assessment: 89 y/o male presenting with numbness around lips and dizziness. PMH significant for sick sinus syndrome  status post pacemaker, atrial fibrillation on warfarin, and CAD status post CABG. In ED, troponin level found to be elevated. Pharmacy has been consulted to initiate heparin  infusion.  - heparin  level 0.36 is therapeutic on heparin  1100 units/hr.   -plt= 119 (history of low plt) -plans for cath when INR < 1.7  Home warfarin regimen: 5 mg daily on Wed & Fri, 2.5 mg all other days (Total weekly dose of 22.5 mg) Last dose: 2.5 mg on evening of 01/21/24  2/8 AM: Heparin  level 0.34, therapeutic on heparin  1150 units/hr. No issues with infusion running or signs of bleeding per RN. CBC stable (hgb 12.9, PLT 121).  INR trending down to 1.3 today.   Goal of Therapy:  Heparin  level 0.3-0.7 units/ml Monitor platelets by anticoagulation protocol: Yes   Plan:  Continue Heparin  at 1150/hr Daily heparin  level, PT/INR and CBC  Morna Breach, PharmD PGY-1 Acute Care Pharmacy Resident 01/26/2024 10:20 AM

## 2024-01-26 NOTE — Progress Notes (Signed)
   Rounding Note    Patient Name: Lateef Juncaj Date of Encounter: 01/26/2024  [facility] Cardiologist: Redell Leiter, MD   Subjective   NAEO. Gas this AM improved with simethicone .  Vital Signs    Vitals:   01/25/24 2006 01/25/24 2144 01/26/24 0510 01/26/24 0639  BP: 138/84 126/83 (!) 158/83   Pulse: 74   71  Resp: 19     Temp: (!) 97.5 F (36.4 C)   98.1 F (36.7 C)  TempSrc: Oral   Oral  SpO2: 98%   96%  Weight:      Height:        Intake/Output Summary (Last 24 hours) at 01/26/2024 0801 Last data filed at 01/26/2024 0416 Gross per 24 hour  Intake 702.12 ml  Output 2010 ml  Net -1307.88 ml      01/23/2024    6:26 PM 01/22/2024    2:32 PM 01/22/2024    2:30 PM  Last 3 Weights  Weight (lbs) 163 lb 12.8 oz 175 lb 7.8 oz 175 lb 7.8 oz  Weight (kg) 74.299 kg 79.6 kg 79.6 kg      Telemetry    Personally Reviewed  ECG    Personally Reviewed  Physical Exam   GEN: No acute distress.   Cardiac: RRR, no murmurs, rubs, or gallops.  Respiratory: Clear to auscultation bilaterally. Psych: Normal affect   Assessment & Plan    #NSTEMI Chest pain-free and hemodynamically stable INR trending down in preparation for left heart catheterization Monday. Labwork pending this AM. Continue heparin  drip Continue aspirin   continue Zetia   continue Crestor , increase to 10 mg daily   #Permanent atrial fibrillation #Tachybradycardia syndrome #Permanent pacemaker in situ Device functioning appropriately. Heparin  as above   Keep n.p.o. Sunday night.    Ole T. Cindie, MD, [facility], [facility] Cardiac Electrophysiology

## 2024-01-27 ENCOUNTER — Other Ambulatory Visit (HOSPITAL_COMMUNITY): Payer: Self-pay

## 2024-01-27 ENCOUNTER — Telehealth (HOSPITAL_COMMUNITY): Payer: Self-pay

## 2024-01-27 DIAGNOSIS — I214 Non-ST elevation (NSTEMI) myocardial infarction: Secondary | ICD-10-CM | POA: Diagnosis not present

## 2024-01-27 DIAGNOSIS — I482 Chronic atrial fibrillation, unspecified: Secondary | ICD-10-CM | POA: Diagnosis not present

## 2024-01-27 DIAGNOSIS — N401 Enlarged prostate with lower urinary tract symptoms: Secondary | ICD-10-CM | POA: Diagnosis not present

## 2024-01-27 DIAGNOSIS — I4821 Permanent atrial fibrillation: Secondary | ICD-10-CM | POA: Diagnosis not present

## 2024-01-27 LAB — BASIC METABOLIC PANEL
Anion gap: 10 (ref 5–15)
BUN: 5 mg/dL — ABNORMAL LOW (ref 8–23)
CO2: 23 mmol/L (ref 22–32)
Calcium: 9.1 mg/dL (ref 8.9–10.3)
Chloride: 104 mmol/L (ref 98–111)
Creatinine, Ser: 0.79 mg/dL (ref 0.61–1.24)
GFR, Estimated: >60 mL/min (ref >60)
Glucose, Bld: 105 mg/dL — ABNORMAL HIGH (ref 70–99)
Potassium: 3.9 mmol/L (ref 3.5–5.1)
Sodium: 137 mmol/L (ref 135–145)

## 2024-01-27 LAB — CBC
HCT: 36.7 % — ABNORMAL LOW (ref 39.0–52.0)
Hemoglobin: 12.2 g/dL — ABNORMAL LOW (ref 13.0–17.0)
MCH: 30.3 pg (ref 26.0–34.0)
MCHC: 33.2 g/dL (ref 30.0–36.0)
MCV: 91.3 fL (ref 80.0–100.0)
Platelets: 132 10*3/uL — ABNORMAL LOW (ref 150–400)
RBC: 4.02 MIL/uL — ABNORMAL LOW (ref 4.22–5.81)
RDW: 14.7 % (ref 11.5–15.5)
WBC: 6.1 10*3/uL (ref 4.0–10.5)
nRBC: 0 % (ref 0.0–0.2)

## 2024-01-27 LAB — HEPARIN LEVEL (UNFRACTIONATED): Heparin Unfractionated: 0.51 [IU]/mL (ref 0.30–0.70)

## 2024-01-27 LAB — MAGNESIUM: Magnesium: 2 mg/dL (ref 1.7–2.4)

## 2024-01-27 LAB — PROTIME-INR
INR: 1.2 (ref 0.8–1.2)
Prothrombin Time: 15.4 s — ABNORMAL HIGH (ref 11.4–15.2)

## 2024-01-27 MED ORDER — ALUM & MAG HYDROXIDE-SIMETH 200-200-20 MG/5ML PO SUSP
30.0000 mL | ORAL | Status: DC | PRN
  Administered 2024-01-27 – 2024-01-28 (×2): 30 mL via ORAL
  Filled 2024-01-27 (×2): qty 30

## 2024-01-27 NOTE — Progress Notes (Signed)
 PHARMACY - ANTICOAGULATION CONSULT NOTE  Pharmacy Consult for heparin  Indication: chest pain/ACS  Allergies  Allergen Reactions   Paxil [Paroxetine Hcl] Palpitations   Codeine Other (See Comments)   Eliquis  [Apixaban ] Other (See Comments)    dizziness   Pravachol [Pravastatin Sodium] Other (See Comments)    myalgias   Zetia  [Ezetimibe ] Other (See Comments)    myalgias   Penicillins Hives, Rash and Other (See Comments)    Happened in 1959   Vytorin [Ezetimibe -Simvastatin] Other (See Comments)    myalgias   Patient Measurements: Height: 6' (182.9 cm) Weight: 74.3 kg (163 lb 12.8 oz) IBW/kg (Calculated) : 77.6 Heparin  Dosing Weight: 79.6 kg  Vital Signs: Temp: 97.7 F (36.5 C) (02/10 0330) Temp Source: Oral (02/10 0330) BP: 148/86 (02/10 0858) Pulse Rate: 80 (02/10 0330)  Labs: Recent Labs    01/25/24 0325 01/26/24 0758 01/27/24 0711  HGB 11.8* 12.9* 12.2*  HCT 35.2* 38.2* 36.7*  PLT 117* 121* 132*  LABPROT 19.9* 16.4* 15.4*  INR 1.7* 1.3* 1.2  HEPARINUNFRC 0.31 0.34 0.51  CREATININE 0.73 0.78 0.79    Estimated Creatinine Clearance: 61.9 mL/min (by C-G formula based on SCr of 0.79 mg/dL).   Medical History: Past Medical History:  Diagnosis Date   Anal fissure    Arthritis    Atrial fibrillation (HCC)    BPH (benign prostatic hypertrophy)    CAD (coronary artery disease)    Dr. Anastasia Balo   Cataract    COVID-19    Depression 2004   Diverticulosis    GERD (gastroesophageal reflux disease)    History of TIAs    Hyperlipidemia    Hypertension    Hypertensive heart disease without CHF    Hypothyroidism    Internal hemorrhoids    Lumbar disc disease    Oral bleeding 08/20/2012   Following molar tooth extraction July, 2013   Pneumonia    Ruptured disk 1995   Shingles    Thyroid  disease    Vitamin D  deficiency    Vitreous detachment    Assessment: 89 y/o male presenting with numbness around lips and dizziness. PMH significant for sick sinus syndrome  status post pacemaker, atrial fibrillation on warfarin, and CAD status post CABG. In ED, troponin level found to be elevated. Pharmacy has been consulted to initiate heparin  infusion.  - heparin  level 0.51 is therapeutic on heparin  1100 units/hr.   -plt= 132 (history of low plt) -INR= 1.2  Home warfarin regimen: 5 mg daily on Wed & Fri, 2.5 mg all other days (Total weekly dose of 22.5 mg) Last dose: 2.5 mg on evening of 01/21/24   Goal of Therapy:  Heparin  level 0.3-0.7 units/ml Monitor platelets by anticoagulation protocol: Yes   Plan:  Continue Heparin  at 1150/hr Daily heparin  level, PT/INR and CBC Will follow plans post cath  Baxter Limber, PharmD Clinical Pharmacist **Pharmacist phone directory can now be found on [email or web address] ([email or web address]).  Listed under [facility].

## 2024-01-27 NOTE — Telephone Encounter (Signed)
 Pharmacy Patient Advocate Encounter  Insurance verification completed.    The patient is insured through [employer].     Ran test claim for Lovenox  and the current 30 day co-pay is $16.00.   This test claim was processed through [facility]- copay amounts may vary at other pharmacies due to pharmacy/plan contracts, or as the patient moves through the different stages of their insurance plan.

## 2024-01-27 NOTE — TOC Initial Note (Signed)
 Transition of Care [facility]) - Initial/Assessment Note    Patient Details  Name: Dalton Vaughan MRN: [id number] Date of Birth: 1929/10/08  [facility] CM/SW Contact:    Cosimo Diones, RN Phone Number: 01/27/2024, 4:34 PM  Clinical Narrative: Patient presented for chest pain. Per notes, plan for [facility] 01-28-24. PTA patient reports that he was from home with son. Son at the bedside during the visit. Patient repots that he has DME cane and rolling walker; however, does not use any equipment. Case Manager will continue to follow for transition of care needs as the patient progresses.                 Expected Discharge Plan: Home/Self Care Barriers to Discharge: Continued Medical Work up   Patient Goals and CMS Choice Patient states their goals for this hospitalization and ongoing recovery are:: Plan to return home once stable.   Choice offered to / list presented to : NA   Expected Discharge Plan and Services In-house Referral: NA Discharge Planning Services: CM Consult Post Acute Care Choice: NA Living arrangements for the past 2 months: Single Family Home                   DME Agency: NA  Prior Living Arrangements/Services Living arrangements for the past 2 months: Single Family Home Lives with:: Adult Children Patient language and need for interpreter reviewed:: Yes Do you feel safe going back to the place where you live?: Yes      Need for Family Participation in Patient Care: Yes (Comment) Care giver support system in place?: Yes (comment) Current home services: DME (cane and rolling walker) Criminal Activity/Legal Involvement Pertinent to Current Situation/Hospitalization: No - Comment as needed  Activities of Daily Living   ADL Screening (condition at time of admission) Independently performs ADLs?: Yes (appropriate for developmental age) Is the patient deaf or have difficulty hearing?: No Does the patient have difficulty seeing, even when wearing  glasses/contacts?: Yes Does the patient have difficulty concentrating, remembering, or making decisions?: No  Permission Sought/Granted Permission sought to share information with : Family Supports, Case Manager   Emotional Assessment Appearance:: Appears stated age Attitude/Demeanor/Rapport: Engaged Affect (typically observed): Appropriate Orientation: : Oriented to Self, Oriented to Place, Oriented to  Time, Oriented to Situation Alcohol / Substance Use: Not Applicable Psych Involvement: No (comment)  Admission diagnosis:  NSTEMI (non-ST elevated myocardial infarction) (HCC) [I21.4] Lip numbness [R20.0] Acute pain of both shoulders [M25.511, M25.512] Patient Active Problem List   Diagnosis Date Noted   NSTEMI (non-ST elevated myocardial infarction) (HCC) 01/22/2024   Shoulder pain, bilateral 01/22/2024   Prolonged QT interval 01/22/2024   Acquired hypothyroidism 01/22/2024   Purpura senilis (HCC) 06/27/2022   Left lower quadrant abdominal pain 11/08/2021   Diverticulitis 08/08/2021   Pacemaker 11/03/2020   Radiculopathy, lumbar region 01/25/2020   Essential hypertension 01/25/2020   Acute respiratory failure with hypoxia (HCC) 09/16/2019   Osteoarthritis of spine with radiculopathy, lumbar region 01/08/2018   Thyroid  disease    Aortic atherosclerosis (HCC) 05/02/2017   Thrombocytopenia (HCC) 07/10/2016   Cervical spondylosis without myelopathy 04/25/2016   Lip numbness 02/15/2016   Hereditary and idiopathic peripheral neuropathy 10/16/2015   Polyneuropathy 10/04/2015   Allergic rhinitis 05/06/2014   Depression 08/03/2013   Sick sinus syndrome (HCC) 07/31/2012   Hypertensive heart disease without CHF    Chronic atrial fibrillation (HCC)    Coronary artery disease involving native coronary artery of native heart with angina pectoris (HCC)  History of TIAs    GERD (gastroesophageal reflux disease)    Chronic anticoagulation    Benign prostatic hyperplasia    Mixed  hyperlipidemia    PCP:  Eliodoro Guerin, DO Pharmacy:   [facility] DELIVERY - [address] [place] - [address] [place] [address] Phone: [phone] Fax: [phone]  [facility] [place], [place] - [address] [place] [address] Phone: [phone] Fax: [phone]  Social Drivers of Health (SDOH) Social History: SDOH Screenings   Food Insecurity: No Food Insecurity (01/24/2024)  Housing: Low Risk  (01/24/2024)  Transportation Needs: No Transportation Needs (01/24/2024)  Utilities: Not At Risk (01/24/2024)  Alcohol Screen: Low Risk  (03/05/2023)  Depression (PHQ2-9): Low Risk  (01/07/2024)  Financial Resource Strain: Low Risk  (05/06/2023)   Received from [facility], [facility]  Physical Activity: Sufficiently Active (03/05/2023)  Social Connections: Socially Isolated (01/24/2024)  Stress: No Stress Concern Present (03/05/2023)  Tobacco Use: Medium Risk (01/22/2024)   Readmission Risk Interventions     No data to display

## 2024-01-27 NOTE — H&P (View-Only) (Signed)
Rounding Note    Patient Name: Dalton Vaughan Date of Encounter: 01/27/2024  [facility] Cardiologist: Norman Herrlich, MD, follows currently with [facility]  Subjective   Patient reports he is doing well this am. Denies any CP, SOB, or dizziness. He does admit to ongoing gas. He is accompanied by his son at bedside. They are both aware and understand the LCA that is scheduled for today.  Inpatient Medications    Scheduled Meds:  amLODipine  5 mg Oral Daily   [START ON 01/28/2024] aspirin EC  81 mg Oral Daily   doxazosin  8 mg Oral QHS   ezetimibe  10 mg Oral Daily   isosorbide mononitrate  30 mg Oral Daily   levothyroxine  50 mcg Oral Q0600   pantoprazole  40 mg Oral Daily   ramipril  10 mg Oral Daily   rosuvastatin  10 mg Oral QODAY   senna  1 tablet Oral QHS   Continuous Infusions:  sodium chloride 1 mL/kg/hr (01/27/24 0428)   heparin 1,150 Units/hr (01/27/24 0428)   PRN Meds: acetaminophen **OR** acetaminophen, alum & mag hydroxide-simeth, nitroGLYCERIN, polyethylene glycol, prochlorperazine, simethicone   Vital Signs    Vitals:   01/26/24 1250 01/26/24 2000 01/27/24 0330 01/27/24 0858  BP: 108/70 127/85 135/80 (!) 148/86  Pulse: 79 88 80   Resp: 18 17 18    Temp: 98.1 F (36.7 C) 98 F (36.7 C) 97.7 F (36.5 C)   TempSrc: Oral Oral Oral   SpO2: 98% 97% 96%   Weight:      Height:        Intake/Output Summary (Last 24 hours) at 01/27/2024 1024 Last data filed at 01/27/2024 0428 Gross per 24 hour  Intake 625.21 ml  Output 600 ml  Net 25.21 ml      01/23/2024    6:26 PM 01/22/2024    2:32 PM 01/22/2024    2:30 PM  Last 3 Weights  Weight (lbs) 163 lb 12.8 oz 175 lb 7.8 oz 175 lb 7.8 oz  Weight (kg) 74.299 kg 79.6 kg 79.6 kg      Telemetry    V paced with atrial fibrillation, HR 70's this am- Personally Reviewed   Physical Exam   GEN: Sitting upright in bed in no acute distress. Son is at bedside.   Neck: No JVD Cardiac:  Irregular rate and rhythm,  Grade 2 systolic murmur, Radial pulse 2+ Respiratory: Clear to auscultation bilaterally. GI: Soft, nontender, non-distended  MS: No edema; No deformity. Neuro:  Nonfocal  Psych: Normal affect   Labs    High Sensitivity Troponin:   Recent Labs  Lab 01/22/24 1527 01/22/24 1808 01/22/24 2224 01/23/24 0009  TROPONINIHS 808* 876* 1,368* 1,257*     Chemistry Recent Labs  Lab 01/23/24 0507 01/24/24 0340 01/25/24 0325 01/26/24 0758 01/27/24 0711  NA 136   < > 136 137 137  K 3.6   < > 4.1 4.2 3.9  CL 101   < > 101 103 104  CO2 26   < > 24 25 23   GLUCOSE 91   < > 92 100* 105*  BUN 8   < > 5* <5* 5*  CREATININE 0.77   < > 0.73 0.78 0.79  CALCIUM 8.9   < > 9.0 9.2 9.1  MG 2.3   < > 2.0 2.0 2.0  PROT 6.5  --   --   --   --   ALBUMIN 3.9  --   --   --   --  AST 27  --   --   --   --   ALT 12  --   --   --   --   ALKPHOS 81  --   --   --   --   BILITOT 1.1  --   --   --   --   GFRNONAA >60   < > >60 >60 >60  ANIONGAP 9   < > 11 9 10    < > = values in this interval not displayed.    Lipids  Recent Labs  Lab 01/24/24 0340  CHOL 150  TRIG 32  HDL 43  LDLCALC 101*  CHOLHDL 3.5    Hematology Recent Labs  Lab 01/25/24 0325 01/26/24 0758 01/27/24 0711  WBC 6.5 7.0 6.1  RBC 3.86* 4.23 4.02*  HGB 11.8* 12.9* 12.2*  HCT 35.2* 38.2* 36.7*  MCV 91.2 90.3 91.3  MCH 30.6 30.5 30.3  MCHC 33.5 33.8 33.2  RDW 14.8 14.7 14.7  PLT 117* 121* 132*   Thyroid No results for input(s): "TSH", "FREET4" in the last 168 hours.  BNPNo results for input(s): "BNP", "PROBNP" in the last 168 hours.  DDimer No results for input(s): "DDIMER" in the last 168 hours.   Radiology    No results found.  Cardiac Studies   Echo 01/23/24  1. Left ventricular ejection fraction, by estimation, is 65 to 70%. The  left ventricle has normal function. The left ventricle has no regional  wall motion abnormalities. There is moderate asymmetric left ventricular  hypertrophy of the septal segment.   Left ventricular diastolic parameters are indeterminate.   2. Right ventricular systolic function is normal. The right ventricular  size is normal. There is normal pulmonary artery systolic pressure. The  estimated right ventricular systolic pressure is 30.8 mmHg.   3. Left atrial size was severely dilated.   4. Right atrial size was severely dilated.   5. The mitral valve is degenerative. Mild to moderate mitral valve  regurgitation.   6. The aortic valve is tricuspid. There is mild calcification of the  aortic valve. Aortic valve regurgitation is not visualized.   7. Aortic dilatation noted. There is mild dilatation of the aortic root,  measuring 41 mm.   8. The inferior vena cava is dilated in size with >50% respiratory  variability, suggesting right atrial pressure of 8 mmHg.   Assessment & Plan    NSTEMI CAD s/p CABG in 1999 HLD  - He underwent CABG in 1999 and is unaware of any PCI or STENT placements.  - Per chart review, Patient transferred from [facility]. Initially presented with worsening bilateral shoulder pain radiating to left arm at rest or with activity. Patient is otherwise baseline functional and active with any any discomfort.  - tn 1368 > 1257 -  ECHO 01/23/24: LVEF 65-70%, No RWMA, Moderate LV hypertrophy, L/R atrial  severely dilated, Mild MVR, Mild calcification of Aortic Valve, mild Aortic root dilation  - LCA scheduled for today, achieved INR 1.2 today - Previously intolerant to high intensity statins and beta blocker - Continue ASA 81 mg, Imdur 30 mg, Crestor 10 mg every other day, Zetia 10 mg, IV heparin - LDL 101 this admission  - Will consider PCSK9i in outpatient follow up  Chronic Thrombocytopenia  -  119 >117>121> 132 (today), would not cath if PLT <50k   Permanent Atrial Fibrillation  - Home medication is warfarin, will continue post cath  - currently on heparin IV  -  rate controlled without AV nodal agents  Tachybradycardia syndrome  Permanent  Pacemaker in situ  - s/p [employer] placement at [facility] in 2021 - per chart review, interrogate device, normal device function  - Afib RVR last on 12/05/23 lasted few seconds per rep  HTN  - BP is stable, Continue Norsvac 5 mg  For questions or updates, please contact [facility] Please consult [email or web address] for contact info under   Signed, Laverda Page, NP-C 01/27/2024, 11:36 AM Pager: [phone]  I have personally seen and examined this patient. I agree with the assessment and plan as outlined above.  Pt with CAD s/p CABG in 1999 admitted with chest pain and elevated troponin c/w NSTEMI. Normal LV function by echo. Plan cardiac cath today.  Continue IV heparin while awaiting cath.   Verne Carrow, MD, [facility] 01/27/2024 11:52 AM

## 2024-01-27 NOTE — Progress Notes (Signed)
 PROGRESS NOTE  Gadge Grgurich XBM:[id number] DOB: 11-Mar-1929   PCP: Eliodoro Guerin, DO  Patient is from: Home.  Independently ambulates at baseline.  DOA: 01/22/2024 LOS: 4  Chief complaints Chief Complaint  Patient presents with   Dizziness     Brief Narrative / Interim history: 89 year old M with PMH of A-fib on warfarin, SSS/PPM, CAD/CABG in 1999, HTN, BPH, hypothyroidism, cervicalgia and chronic bilateral shoulder pain presented to [facility] with numbness sensation around his lips, and found to have non-STEMI.  No report of chest pain or dyspnea but some lightheadedness associated with numbness.  Reportedly had intermittent numbness for 5 to 6 years.  Also chronic bilateral shoulder pain on and off.  He is followed by neurosurgery for his cervicalgia.   In ED, troponin elevated to 808 and trended up to 1368.  EKG rate controlled A-fib with nonspecific ST and T wave changes.  CXR with cardiac enlargement and small left pleural effusion.  CT head without acute finding.  Cardiology consulted.  Started on heparin  drip.  Transferred to [facility] for heart catheterization which is planned for 2/10.  Subjective: Seen and examined earlier this morning.  Reports another episode of what he calls "gas attack" but lighter.  Currently no complaints.  Objective: Vitals:   01/26/24 1250 01/26/24 2000 01/27/24 0330 01/27/24 0858  BP: 108/70 127/85 135/80 (!) 148/86  Pulse: 79 88 80   Resp: 18 17 18    Temp: 98.1 F (36.7 C) 98 F (36.7 C) 97.7 F (36.5 C)   TempSrc: Oral Oral Oral   SpO2: 98% 97% 96%   Weight:      Height:        Examination:  GENERAL: No apparent distress.  Nontoxic. HEENT: MMM.  Vision and hearing grossly intact.  NECK: Supple.  No apparent JVD.  RESP:  No IWOB.  Fair aeration bilaterally. CVS: Irregular rhythm.  Normal rate.  Heart sounds normal.  ABD/GI/GU: BS+. Abd soft, NTND.  MSK/EXT:  Moves extremities. No apparent deformity. No edema.  FROM in  both shoulders. SKIN: no apparent skin lesion or wound NEURO: Awake, alert and oriented appropriately.  Expressive aphasia (chronic per patient).  No apparent focal neuro deficit. PSYCH: Calm. Normal affect.   Procedures:  None  Microbiology summarized: None  Assessment and plan: NSTEMI: Patient presented with lip numbness and shoulder pain which are chronic.  No report of cardiopulmonary symptoms other than some lightheadedness.  Initial troponin 808 and peaked at 1368.  EKG with nonspecific ST and T wave change.  Already on warfarin at home.  INR therapeutic.  TTE with LVEF of 65 to 70%, moderate asymmetric LVH, severe LAE and RAE and mild to moderate MR.  LDL 101. -Continue heparin  drip, aspirin , Crestor , Imdur , Zetia  -Plan for LHC today.  History of CAD/CABG in 1999 -Management as above.  Intermittent lip numbness: Unclear etiology.  Reportedly ongoing for years.  CT head without acute finding.  Seems to have chronic expressive aphasia as well.  No further numbness since hospitalization. -Continue monitoring   Chronic bilateral shoulder pain: MRI angio neck in 08/2023 without significant finding.  History of cervicalgia.  Followed by [place] [facility].  MRI cervical spine in 11/2023 showed generalized cervical spine degeneration with foraminal impingement bilaterally at C3-4 to C5-6 and on the right at C6-7. Up to mild spinal stenosis at C4-5. -Has outpatient MRI brain scheduled for 02/08/2024. -Continue Tylenol  as needed   Prolonged QT interval: QTc 523> 420. -Avoid QT prolonging drugs -Optimize  electrolytes   Chronic thrombocytopenia-stable. -Continue monitoring.    Permanent atrial fibrillation/tachybradycardia syndrome: To have intermittent palpitation.  INR 1.3 this morning -Currently on heparin . -Continue holding warfarin   Essential hypertension: Normotensive -Continue amlodipine , ramipril  and Imdur  -Continue holding Maxzide    Mixed hyperlipidemia -Continue  Crestor  and Zetia    Acquired hypothyroidism -Continue Synthroid    BPH without LUTS -Continue doxazosin   Hypokalemia -Monitor replenish as appropriate  GERD/gas? -Start PPI, Gas-X and bowel regimen  Thrombocytopenia: Mild.  Improving.  Body mass index is 22.22 kg/m.           DVT prophylaxis:  SCDs Start: 01/22/24 2217 On full dose anticoagulation. Code Status: Full code Family Communication: None at bedside Level of care: Telemetry Cardiac Status is: Inpatient Remains inpatient appropriate because: Non-STEMI   Final disposition: Home Consultants:  Cardiology  35 minutes with more than 50% spent in reviewing records, counseling patient/family and coordinating care.   Sch Meds:  Scheduled Meds:  amLODipine   5 mg Oral Daily   [START ON 01/28/2024] aspirin  EC  81 mg Oral Daily   doxazosin   8 mg Oral QHS   ezetimibe   10 mg Oral Daily   isosorbide  mononitrate  30 mg Oral Daily   levothyroxine   50 mcg Oral Q0600   pantoprazole   40 mg Oral Daily   ramipril   10 mg Oral Daily   rosuvastatin   10 mg Oral QODAY   senna  1 tablet Oral QHS   Continuous Infusions:  sodium chloride  1 mL/kg/hr (01/27/24 0428)   heparin  1,150 Units/hr (01/27/24 0428)   PRN Meds:.acetaminophen  **OR** acetaminophen , alum & mag hydroxide-simeth, nitroGLYCERIN , polyethylene glycol, prochlorperazine , simethicone   Antimicrobials: Anti-infectives (From admission, onward)    None        I have personally reviewed the following labs and images: CBC: Recent Labs  Lab 01/23/24 0507 01/24/24 0340 01/25/24 0325 01/26/24 0758 01/27/24 0711  WBC 6.1 6.9 6.5 7.0 6.1  HGB 13.6 11.8* 11.8* 12.9* 12.2*  HCT 41.9 35.3* 35.2* 38.2* 36.7*  MCV 95.9 90.7 91.2 90.3 91.3  PLT 94* 119* 117* 121* 132*   BMP &GFR Recent Labs  Lab 01/23/24 0507 01/24/24 0340 01/25/24 0325 01/26/24 0758 01/27/24 0711  NA 136 136 136 137 137  K 3.6 3.2* 4.1 4.2 3.9  CL 101 101 101 103 104  CO2 26 27 24 25  23   GLUCOSE 91 88 92 100* 105*  BUN 8 8 5* <5* 5*  CREATININE 0.77 0.75 0.73 0.78 0.79  CALCIUM  8.9 8.5* 9.0 9.2 9.1  MG 2.3 2.0 2.0 2.0 2.0  PHOS 2.7  --   --   --   --    Estimated Creatinine Clearance: 61.9 mL/min (by C-G formula based on SCr of 0.79 mg/dL). Liver & Pancreas: Recent Labs  Lab 01/23/24 0507  AST 27  ALT 12  ALKPHOS 81  BILITOT 1.1  PROT 6.5  ALBUMIN 3.9   No results for input(s): "LIPASE", "AMYLASE" in the last 168 hours. No results for input(s): "AMMONIA" in the last 168 hours. Diabetic: No results for input(s): "HGBA1C" in the last 72 hours. No results for input(s): "GLUCAP" in the last 168 hours. Cardiac Enzymes: No results for input(s): "CKTOTAL", "CKMB", "CKMBINDEX", "TROPONINI" in the last 168 hours. No results for input(s): "PROBNP" in the last 8760 hours. Coagulation Profile: Recent Labs  Lab 01/23/24 1332 01/24/24 0340 01/25/24 0325 01/26/24 0758 01/27/24 0711  INR 2.1* 2.2* 1.7* 1.3* 1.2   Thyroid  Function Tests: No results for input(s): "TSH", "T4TOTAL", "  FREET4", "T3FREE", "THYROIDAB" in the last 72 hours. Lipid Profile: No results for input(s): "CHOL", "HDL", "LDLCALC", "TRIG", "CHOLHDL", "LDLDIRECT" in the last 72 hours.  Anemia Panel: No results for input(s): "VITAMINB12", "FOLATE", "FERRITIN", "TIBC", "IRON", "RETICCTPCT" in the last 72 hours. Urine analysis:    Component Value Date/Time   COLORURINE YELLOW 07/31/2012 1452   APPEARANCEUR Clear 11/20/2022 0000   LABSPEC 1.009 07/31/2012 1452   PHURINE 7.0 07/31/2012 1452   GLUCOSEU Negative 11/20/2022 0000   HGBUR NEGATIVE 07/31/2012 1452   BILIRUBINUR Negative 11/20/2022 0000   KETONESUR 15 (A) 07/31/2012 1452   PROTEINUR Negative 11/20/2022 0000   PROTEINUR NEGATIVE 07/31/2012 1452   UROBILINOGEN negative 11/03/2013 1602   UROBILINOGEN 0.2 07/31/2012 1452   NITRITE Negative 11/20/2022 0000   NITRITE NEGATIVE 07/31/2012 1452   LEUKOCYTESUR Negative 11/20/2022 0000    Sepsis Labs: Invalid input(s): "PROCALCITONIN", "LACTICIDVEN"  Microbiology: No results found for this or any previous visit (from the past 240 hours).  Radiology Studies: No results found.     Trinnity Breunig T. Emary Zalar [facility]  If 7PM-7AM, please contact night-coverage [email or web address] 01/27/2024, 11:32 AM

## 2024-01-27 NOTE — Progress Notes (Addendum)
 Rounding Note    Patient Name: Dalton Vaughan Date of Encounter: 01/27/2024  [facility] Cardiologist: Norman Herrlich, MD, follows currently with [facility]  Subjective   Patient reports he is doing well this am. Denies any CP, SOB, or dizziness. He does admit to ongoing gas. He is accompanied by his son at bedside. They are both aware and understand the LCA that is scheduled for today.  Inpatient Medications    Scheduled Meds:  amLODipine  5 mg Oral Daily   [START ON 01/28/2024] aspirin EC  81 mg Oral Daily   doxazosin  8 mg Oral QHS   ezetimibe  10 mg Oral Daily   isosorbide mononitrate  30 mg Oral Daily   levothyroxine  50 mcg Oral Q0600   pantoprazole  40 mg Oral Daily   ramipril  10 mg Oral Daily   rosuvastatin  10 mg Oral QODAY   senna  1 tablet Oral QHS   Continuous Infusions:  sodium chloride 1 mL/kg/hr (01/27/24 0428)   heparin 1,150 Units/hr (01/27/24 0428)   PRN Meds: acetaminophen **OR** acetaminophen, alum & mag hydroxide-simeth, nitroGLYCERIN, polyethylene glycol, prochlorperazine, simethicone   Vital Signs    Vitals:   01/26/24 1250 01/26/24 2000 01/27/24 0330 01/27/24 0858  BP: 108/70 127/85 135/80 (!) 148/86  Pulse: 79 88 80   Resp: 18 17 18    Temp: 98.1 F (36.7 C) 98 F (36.7 C) 97.7 F (36.5 C)   TempSrc: Oral Oral Oral   SpO2: 98% 97% 96%   Weight:      Height:        Intake/Output Summary (Last 24 hours) at 01/27/2024 1024 Last data filed at 01/27/2024 0428 Gross per 24 hour  Intake 625.21 ml  Output 600 ml  Net 25.21 ml      01/23/2024    6:26 PM 01/22/2024    2:32 PM 01/22/2024    2:30 PM  Last 3 Weights  Weight (lbs) 163 lb 12.8 oz 175 lb 7.8 oz 175 lb 7.8 oz  Weight (kg) 74.299 kg 79.6 kg 79.6 kg      Telemetry    V paced with atrial fibrillation, HR 70's this am- Personally Reviewed   Physical Exam   GEN: Sitting upright in bed in no acute distress. Son is at bedside.   Neck: No JVD Cardiac:  Irregular rate and rhythm,  Grade 2 systolic murmur, Radial pulse 2+ Respiratory: Clear to auscultation bilaterally. GI: Soft, nontender, non-distended  MS: No edema; No deformity. Neuro:  Nonfocal  Psych: Normal affect   Labs    High Sensitivity Troponin:   Recent Labs  Lab 01/22/24 1527 01/22/24 1808 01/22/24 2224 01/23/24 0009  TROPONINIHS 808* 876* 1,368* 1,257*     Chemistry Recent Labs  Lab 01/23/24 0507 01/24/24 0340 01/25/24 0325 01/26/24 0758 01/27/24 0711  NA 136   < > 136 137 137  K 3.6   < > 4.1 4.2 3.9  CL 101   < > 101 103 104  CO2 26   < > 24 25 23   GLUCOSE 91   < > 92 100* 105*  BUN 8   < > 5* <5* 5*  CREATININE 0.77   < > 0.73 0.78 0.79  CALCIUM 8.9   < > 9.0 9.2 9.1  MG 2.3   < > 2.0 2.0 2.0  PROT 6.5  --   --   --   --   ALBUMIN 3.9  --   --   --   --  AST 27  --   --   --   --   ALT 12  --   --   --   --   ALKPHOS 81  --   --   --   --   BILITOT 1.1  --   --   --   --   GFRNONAA >60   < > >60 >60 >60  ANIONGAP 9   < > 11 9 10    < > = values in this interval not displayed.    Lipids  Recent Labs  Lab 01/24/24 0340  CHOL 150  TRIG 32  HDL 43  LDLCALC 101*  CHOLHDL 3.5    Hematology Recent Labs  Lab 01/25/24 0325 01/26/24 0758 01/27/24 0711  WBC 6.5 7.0 6.1  RBC 3.86* 4.23 4.02*  HGB 11.8* 12.9* 12.2*  HCT 35.2* 38.2* 36.7*  MCV 91.2 90.3 91.3  MCH 30.6 30.5 30.3  MCHC 33.5 33.8 33.2  RDW 14.8 14.7 14.7  PLT 117* 121* 132*   Thyroid No results for input(s): "TSH", "FREET4" in the last 168 hours.  BNPNo results for input(s): "BNP", "PROBNP" in the last 168 hours.  DDimer No results for input(s): "DDIMER" in the last 168 hours.   Radiology    No results found.  Cardiac Studies   Echo 01/23/24  1. Left ventricular ejection fraction, by estimation, is 65 to 70%. The  left ventricle has normal function. The left ventricle has no regional  wall motion abnormalities. There is moderate asymmetric left ventricular  hypertrophy of the septal segment.   Left ventricular diastolic parameters are indeterminate.   2. Right ventricular systolic function is normal. The right ventricular  size is normal. There is normal pulmonary artery systolic pressure. The  estimated right ventricular systolic pressure is 30.8 mmHg.   3. Left atrial size was severely dilated.   4. Right atrial size was severely dilated.   5. The mitral valve is degenerative. Mild to moderate mitral valve  regurgitation.   6. The aortic valve is tricuspid. There is mild calcification of the  aortic valve. Aortic valve regurgitation is not visualized.   7. Aortic dilatation noted. There is mild dilatation of the aortic root,  measuring 41 mm.   8. The inferior vena cava is dilated in size with >50% respiratory  variability, suggesting right atrial pressure of 8 mmHg.   Assessment & Plan    NSTEMI CAD s/p CABG in 1999 HLD  - He underwent CABG in 1999 and is unaware of any PCI or STENT placements.  - Per chart review, Patient transferred from [facility]. Initially presented with worsening bilateral shoulder pain radiating to left arm at rest or with activity. Patient is otherwise baseline functional and active with any any discomfort.  - tn 1368 > 1257 -  ECHO 01/23/24: LVEF 65-70%, No RWMA, Moderate LV hypertrophy, L/R atrial  severely dilated, Mild MVR, Mild calcification of Aortic Valve, mild Aortic root dilation  - LCA scheduled for today, achieved INR 1.2 today - Previously intolerant to high intensity statins and beta blocker - Continue ASA 81 mg, Imdur 30 mg, Crestor 10 mg every other day, Zetia 10 mg, IV heparin - LDL 101 this admission  - Will consider PCSK9i in outpatient follow up  Chronic Thrombocytopenia  -  119 >117>121> 132 (today), would not cath if PLT <50k   Permanent Atrial Fibrillation  - Home medication is warfarin, will continue post cath  - currently on heparin IV  -  rate controlled without AV nodal agents  Tachybradycardia syndrome  Permanent  Pacemaker in situ  - s/p [employer] placement at [facility] in 2021 - per chart review, interrogate device, normal device function  - Afib RVR last on 12/05/23 lasted few seconds per rep  HTN  - BP is stable, Continue Norsvac 5 mg  For questions or updates, please contact [facility] Please consult [email or web address] for contact info under   Signed, Laverda Page, NP-C 01/27/2024, 11:36 AM Pager: [phone]  I have personally seen and examined this patient. I agree with the assessment and plan as outlined above.  Pt with CAD s/p CABG in 1999 admitted with chest pain and elevated troponin c/w NSTEMI. Normal LV function by echo. Plan cardiac cath today.  Continue IV heparin while awaiting cath.   Verne Carrow, MD, [facility] 01/27/2024 11:52 AM

## 2024-01-27 NOTE — Progress Notes (Signed)
 Mobility Specialist Progress Note;   01/27/24 1410  Mobility  Activity Ambulated with assistance in hallway  Level of Assistance Standby assist, set-up cues, supervision of patient - no hands on  Assistive Device Other (Comment) (IV pole)  Distance Ambulated (ft) 400 ft  Activity Response Tolerated well  Mobility Referral Yes  Mobility visit 1 Mobility  Mobility Specialist Start Time (ACUTE ONLY) 1410  Mobility Specialist Stop Time (ACUTE ONLY) 1420  Mobility Specialist Time Calculation (min) (ACUTE ONLY) 10 min   Pt agreeable to mobility. Required no physical assistance during ambulation, SV. VSS throughout and no c/o during session. Pt returned safely back to bed with all needs met. Son in room.   Janit Meline Mobility Specialist Please contact via SecureChat or [employer] [phone]

## 2024-01-28 ENCOUNTER — Other Ambulatory Visit (HOSPITAL_COMMUNITY): Payer: Self-pay

## 2024-01-28 ENCOUNTER — Encounter (HOSPITAL_COMMUNITY)
Admission: EM | Disposition: A | Discharge status: Home / Self Care | Payer: Self-pay | Source: Home / Self Care | Attending: Internal Medicine

## 2024-01-28 ENCOUNTER — Telehealth (HOSPITAL_COMMUNITY): Payer: Self-pay | Admitting: Pharmacy Technician

## 2024-01-28 DIAGNOSIS — I2510 Atherosclerotic heart disease of native coronary artery without angina pectoris: Secondary | ICD-10-CM | POA: Diagnosis not present

## 2024-01-28 DIAGNOSIS — M47812 Spondylosis without myelopathy or radiculopathy, cervical region: Secondary | ICD-10-CM

## 2024-01-28 DIAGNOSIS — M25511 Pain in right shoulder: Secondary | ICD-10-CM | POA: Diagnosis not present

## 2024-01-28 DIAGNOSIS — D696 Thrombocytopenia, unspecified: Secondary | ICD-10-CM | POA: Diagnosis not present

## 2024-01-28 DIAGNOSIS — I214 Non-ST elevation (NSTEMI) myocardial infarction: Secondary | ICD-10-CM | POA: Diagnosis not present

## 2024-01-28 HISTORY — PX: CORONARY/GRAFT ANGIOGRAPHY: CATH118237

## 2024-01-28 LAB — CBC
HCT: 35.7 % — ABNORMAL LOW (ref 39.0–52.0)
Hemoglobin: 11.9 g/dL — ABNORMAL LOW (ref 13.0–17.0)
MCH: 30.4 pg (ref 26.0–34.0)
MCHC: 33.3 g/dL (ref 30.0–36.0)
MCV: 91.1 fL (ref 80.0–100.0)
Platelets: 126 10*3/uL — ABNORMAL LOW (ref 150–400)
RBC: 3.92 MIL/uL — ABNORMAL LOW (ref 4.22–5.81)
RDW: 14.8 % (ref 11.5–15.5)
WBC: 6.7 10*3/uL (ref 4.0–10.5)
nRBC: 0 % (ref 0.0–0.2)

## 2024-01-28 LAB — PROTIME-INR
INR: 1.2 (ref 0.8–1.2)
Prothrombin Time: 15.5 s — ABNORMAL HIGH (ref 11.4–15.2)

## 2024-01-28 LAB — BASIC METABOLIC PANEL
Anion gap: 9 (ref 5–15)
BUN: 5 mg/dL — ABNORMAL LOW (ref 8–23)
CO2: 22 mmol/L (ref 22–32)
Calcium: 8.8 mg/dL — ABNORMAL LOW (ref 8.9–10.3)
Chloride: 105 mmol/L (ref 98–111)
Creatinine, Ser: 0.75 mg/dL (ref 0.61–1.24)
GFR, Estimated: >60 mL/min (ref >60)
Glucose, Bld: 94 mg/dL (ref 70–99)
Potassium: 3.7 mmol/L (ref 3.5–5.1)
Sodium: 136 mmol/L (ref 135–145)

## 2024-01-28 LAB — HEPARIN LEVEL (UNFRACTIONATED): Heparin Unfractionated: 0.5 [IU]/mL (ref 0.30–0.70)

## 2024-01-28 LAB — MAGNESIUM: Magnesium: 2 mg/dL (ref 1.7–2.4)

## 2024-01-28 SURGERY — CORONARY/GRAFT ANGIOGRAPHY
Anesthesia: LOCAL

## 2024-01-28 MED ORDER — SODIUM CHLORIDE 0.9 % IV SOLN: INTRAVENOUS | Status: AC

## 2024-01-28 MED ORDER — APIXABAN 5 MG PO TABS: 5.0000 mg | ORAL_TABLET | Freq: Two times a day (BID) | ORAL | Status: DC

## 2024-01-28 MED ORDER — LABETALOL HCL 5 MG/ML IV SOLN: 10.0000 mg | INTRAVENOUS | Status: DC | PRN

## 2024-01-28 MED ORDER — ISOSORBIDE MONONITRATE ER 30 MG PO TB24: 30.0000 mg | ORAL_TABLET | Freq: Every day | ORAL | 1 refills | Status: AC

## 2024-01-28 MED ORDER — HEPARIN (PORCINE) IN NACL 1000-0.9 UT/500ML-% IV SOLN
INTRAVENOUS | Status: DC | PRN
  Administered 2024-01-28 (×3): 500 mL

## 2024-01-28 MED ORDER — HEPARIN (PORCINE) IN NACL 2000-0.9 UNIT/L-% IV SOLN
INTRAVENOUS | Status: DC | PRN
Start: 2024-01-28 — End: 2024-01-28
  Administered 2024-01-28: 1000 mL

## 2024-01-28 MED ORDER — FENTANYL CITRATE (PF) 100 MCG/2ML IJ SOLN
INTRAMUSCULAR | Status: DC | PRN
  Administered 2024-01-28: 25 ug via INTRAVENOUS

## 2024-01-28 MED ORDER — SODIUM CHLORIDE 0.9% FLUSH: 3.0000 mL | Freq: Two times a day (BID) | INTRAVENOUS | Status: DC

## 2024-01-28 MED ORDER — CLOPIDOGREL BISULFATE 75 MG PO TABS: 75.0000 mg | ORAL_TABLET | Freq: Every day | ORAL | 2 refills | Status: AC

## 2024-01-28 MED ORDER — HEPARIN SODIUM (PORCINE) 1000 UNIT/ML IJ SOLN
INTRAMUSCULAR | Status: DC | PRN
  Administered 2024-01-28: 3000 [IU] via INTRAVENOUS
  Administered 2024-01-28: 2000 [IU] via INTRAVENOUS

## 2024-01-28 MED ORDER — VERAPAMIL HCL 2.5 MG/ML IV SOLN
INTRAVENOUS | Status: AC
  Filled 2024-01-28: qty 2

## 2024-01-28 MED ORDER — MIDAZOLAM HCL 2 MG/2ML IJ SOLN
INTRAMUSCULAR | Status: DC | PRN
  Administered 2024-01-28: 1 mg via INTRAVENOUS

## 2024-01-28 MED ORDER — EZETIMIBE 10 MG PO TABS: 10.0000 mg | ORAL_TABLET | Freq: Every day | ORAL | 2 refills | Status: AC

## 2024-01-28 MED ORDER — SODIUM CHLORIDE 0.9 % IV SOLN: 250.0000 mL | INTRAVENOUS | Status: DC | PRN

## 2024-01-28 MED ORDER — LIDOCAINE HCL (PF) 1 % IJ SOLN
INTRAMUSCULAR | Status: DC | PRN
  Administered 2024-01-28: 5 mL

## 2024-01-28 MED ORDER — ASPIRIN 81 MG PO CHEW
81.0000 mg | CHEWABLE_TABLET | ORAL | Status: AC
  Administered 2024-01-28: 81 mg via ORAL
  Filled 2024-01-28: qty 1

## 2024-01-28 MED ORDER — HEPARIN (PORCINE) IN NACL 2-0.9 UNITS/ML
INTRAMUSCULAR | Status: DC | PRN
  Administered 2024-01-28 (×2): 10 mL via INTRA_ARTERIAL

## 2024-01-28 MED ORDER — ROSUVASTATIN CALCIUM 10 MG PO TABS: 10.0000 mg | ORAL_TABLET | ORAL | 0 refills | Status: DC

## 2024-01-28 MED ORDER — APIXABAN 5 MG PO TABS: 5.0000 mg | ORAL_TABLET | Freq: Two times a day (BID) | ORAL | 2 refills | Status: DC

## 2024-01-28 MED ORDER — LIDOCAINE HCL (PF) 1 % IJ SOLN
INTRAMUSCULAR | Status: AC
  Filled 2024-01-28: qty 30

## 2024-01-28 MED ORDER — FENTANYL CITRATE (PF) 100 MCG/2ML IJ SOLN
INTRAMUSCULAR | Status: AC
  Filled 2024-01-28: qty 2

## 2024-01-28 MED ORDER — CLOPIDOGREL BISULFATE 75 MG PO TABS
75.0000 mg | ORAL_TABLET | Freq: Every day | ORAL | Status: DC
  Administered 2024-01-28: 75 mg via ORAL
  Filled 2024-01-28: qty 1

## 2024-01-28 MED ORDER — ACETAMINOPHEN 325 MG PO TABS: 650.0000 mg | ORAL_TABLET | ORAL | Status: DC | PRN

## 2024-01-28 MED ORDER — SODIUM CHLORIDE 0.9% FLUSH: 3.0000 mL | INTRAVENOUS | Status: DC | PRN

## 2024-01-28 MED ORDER — MIDAZOLAM HCL 2 MG/2ML IJ SOLN
INTRAMUSCULAR | Status: AC
  Filled 2024-01-28: qty 2

## 2024-01-28 MED ORDER — SODIUM CHLORIDE 0.9 % IV SOLN
INTRAVENOUS | Status: AC | PRN
  Administered 2024-01-28: 250 mL via INTRAVENOUS

## 2024-01-28 MED ORDER — IOHEXOL 350 MG/ML SOLN
INTRAVENOUS | Status: DC | PRN
  Administered 2024-01-28: 155 mL

## 2024-01-28 MED ORDER — HEPARIN SODIUM (PORCINE) 1000 UNIT/ML IJ SOLN
INTRAMUSCULAR | Status: AC
Start: 2024-01-28 — End: ?
  Filled 2024-01-28: qty 10

## 2024-01-28 MED ORDER — HYDRALAZINE HCL 20 MG/ML IJ SOLN: 10.0000 mg | INTRAMUSCULAR | Status: DC | PRN

## 2024-01-28 SURGICAL SUPPLY — 17 items
CATH EXPO 5F MPA-1 (CATHETERS) IMPLANT
CATH INFINITI 5 FR 3DRC (CATHETERS) IMPLANT
CATH INFINITI 5 FR IM (CATHETERS) IMPLANT
CATH INFINITI 5 FR JL3.5 (CATHETERS) IMPLANT
CATH INFINITI 5 FR LCB (CATHETERS) IMPLANT
CATH INFINITI 5FR AL1 (CATHETERS) IMPLANT
CATH INFINITI 5FR JL4 (CATHETERS) IMPLANT
CATH INFINITI AMBI 5FR TG (CATHETERS) IMPLANT
CATH INFINITI JR4 5F (CATHETERS) IMPLANT
DEVICE RAD COMP TR BAND LRG (VASCULAR PRODUCTS) IMPLANT
GLIDESHEATH SLEND A-KIT 6F 22G (SHEATH) IMPLANT
GUIDEWIRE INQWIRE 1.5J.035X260 (WIRE) IMPLANT
INQWIRE 1.5J .035X260CM (WIRE) ×1
PACK CARDIAC CATHETERIZATION (CUSTOM PROCEDURE TRAY) ×1 IMPLANT
SET ATX-X65L (MISCELLANEOUS) IMPLANT
SHEATH 6FR 85 DEST SLENDER (SHEATH) IMPLANT
WIRE HI TORQ VERSACORE-J 145CM (WIRE) IMPLANT

## 2024-01-28 NOTE — Telephone Encounter (Signed)
Patient Product/process development scientist completed.    The patient is insured through [employer].     Ran test claim for Eliquis 5 mg and the current 30 day co-pay is $43.00.  Ran test claim for Xarelto 20 mg and the current 30 day co-pay is $43.00.  This test claim was processed through [facility]- copay amounts may vary at other pharmacies due to [employer], or as the patient moves through the different stages of their insurance plan.     Roland Earl, CPHT Pharmacy Technician III Certified Patient Advocate [facility] Patient Advocate Team Direct Number: [phone]  Fax: [phone]

## 2024-01-28 NOTE — Discharge Instructions (Addendum)

## 2024-01-28 NOTE — Progress Notes (Signed)
PROGRESS NOTE  Dalton Vaughan ZOX:[id number] DOB: June 08, 1929   PCP: Raliegh Ip, DO  Patient is from: Home.  Independently ambulates at baseline.  DOA: 01/22/2024 LOS: 5  Chief complaints Chief Complaint  Patient presents with   Dizziness     Brief Narrative / Interim history: 89 year old M with PMH of A-fib on warfarin, SSS/PPM, CAD/CABG in 1999, HTN, BPH, hypothyroidism, cervicalgia and chronic bilateral shoulder pain presented to [facility] with numbness sensation around his lips, and found to have non-STEMI.  No report of chest pain or dyspnea but some lightheadedness associated with numbness.  Reportedly had intermittent numbness for 5 to 6 years.  Also chronic bilateral shoulder pain on and off.  He is followed by neurosurgery for his cervicalgia.   In ED, troponin elevated to 808 and trended up to 1368.  EKG rate controlled A-fib with nonspecific ST and T wave changes.  CXR with cardiac enlargement and small left pleural effusion.  CT head without acute finding.  Cardiology consulted.  Started on heparin drip.  LHC on 2/11 with multivessel CAD.  Cardiology recommended medical management.  Subjective: Seen and examined earlier this morning before he went for heart catheterization.  Reports feeling "bubbles" in his chest that he attributes to gas.  Denies chest pain or shortness of breath.  Patient's son at bedside.  Frustrated about delay of his heart catheterization.  Objective: Vitals:   01/28/24 1021 01/28/24 1026 01/28/24 1040 01/28/24 1119  BP: 124/70 124/70 (!) 140/66 126/71  Pulse: 84 (!) 0 83 69  Resp: 15  16 16   Temp:   97.7 F (36.5 C) 97.7 F (36.5 C)  TempSrc:   Oral Axillary  SpO2: 94%  94% 95%  Weight:      Height:        Examination:  GENERAL: No apparent distress.  Nontoxic. HEENT: MMM.  Vision and hearing grossly intact.  NECK: Supple.  No apparent JVD.  RESP:  No IWOB.  Fair aeration bilaterally. CVS: Irregular rhythm.  Normal rate.   Heart sounds normal.  ABD/GI/GU: BS+. Abd soft, NTND.  MSK/EXT:  Moves extremities. No apparent deformity. No edema.  FROM in both shoulders. SKIN: no apparent skin lesion or wound NEURO: Awake, alert and oriented appropriately.  Expressive aphasia (chronic per patient).  No apparent focal neuro deficit. PSYCH: Calm. Normal affect.   Procedures:  None  Microbiology summarized: None  Assessment and plan: NSTEMI: Patient presented with lip numbness and shoulder pain which are chronic.  No report of cardiopulmonary symptoms other than some lightheadedness.  Initial troponin 808 and peaked at 1368.  EKG with nonspecific ST and T wave change.  Already on warfarin at home.  INR therapeutic.  TTE with LVEF of 65 to 70%, moderate asymmetric LVH, severe LAE and RAE and mild to moderate MR.  LDL 101.  Left upper catheterization on 2/11 with multivessel CAD.  Cardiology recommended medical management. -Defer treatment plan to cardiology.  Currently on aspirin, Crestor, Imdur, Zetia  History of CAD/CABG in 1999 -Management as above.  Intermittent lip numbness: Unclear etiology.  Reportedly ongoing for years.  CT head without acute finding.  Seems to have chronic expressive aphasia as well.  No further numbness since hospitalization. -Continue monitoring   Chronic bilateral shoulder pain: MRI angio neck in 08/2023 without significant finding.  History of cervicalgia.  Followed by [facility].  MRI cervical spine in 11/2023 showed generalized cervical spine degeneration with foraminal impingement bilaterally at C3-4 to C5-6 and on  the right at C6-7. Up to mild spinal stenosis at C4-5. -Has outpatient MRI brain scheduled for 02/08/2024. -Continue Tylenol as needed   Prolonged QT interval: Resolved.  QTc 523> 420. -Avoid QT prolonging drugs -Optimize electrolytes   Chronic thrombocytopenia-stable. -Continue monitoring.    Permanent atrial fibrillation/tachybradycardia syndrome: Was on  warfarin.  INR 1.2. -Currently on heparin. -Continue holding warfarin   Essential hypertension: Normotensive -Continue amlodipine, ramipril and Imdur -Continue holding Maxzide   Mixed hyperlipidemia -Continue Crestor and Zetia   Acquired hypothyroidism -Continue Synthroid   BPH without LUTS -Continue doxazosin  Hypokalemia -Monitor replenish as appropriate  GERD/gas? -Continue PPI, Gas-X and bowel regimen  Thrombocytopenia: Mild.  Improving.  Body mass index is 22.22 kg/m.           DVT prophylaxis:  SCD's Start: 01/28/24 1039 SCDs Start: 01/22/24 2217 On full dose anticoagulation. Code Status: Full code Family Communication: Updated patient's son at bedside. Level of care: Telemetry Cardiac Status is: Inpatient Remains inpatient appropriate because: Non-STEMI   Final disposition: Home Consultants:  Cardiology  35 minutes with more than 50% spent in reviewing records, counseling patient/family and coordinating care.   Sch Meds:  Scheduled Meds:  amLODipine  5 mg Oral Daily   aspirin EC  81 mg Oral Daily   doxazosin  8 mg Oral QHS   ezetimibe  10 mg Oral Daily   isosorbide mononitrate  30 mg Oral Daily   levothyroxine  50 mcg Oral Q0600   pantoprazole  40 mg Oral Daily   ramipril  10 mg Oral Daily   rosuvastatin  10 mg Oral QODAY   senna  1 tablet Oral QHS   sodium chloride flush  3 mL Intravenous Q12H   Continuous Infusions:  sodium chloride 100 mL/hr at 01/28/24 1042   sodium chloride     heparin Stopped (01/28/24 0835)   PRN Meds:.sodium chloride, acetaminophen **OR** acetaminophen, acetaminophen, alum & mag hydroxide-simeth, hydrALAZINE, labetalol, nitroGLYCERIN, polyethylene glycol, prochlorperazine, simethicone, sodium chloride flush  Antimicrobials: Anti-infectives (From admission, onward)    None        I have personally reviewed the following labs and images: CBC: Recent Labs  Lab 01/24/24 0340 01/25/24 0325 01/26/24 0758  01/27/24 0711 01/28/24 0630  WBC 6.9 6.5 7.0 6.1 6.7  HGB 11.8* 11.8* 12.9* 12.2* 11.9*  HCT 35.3* 35.2* 38.2* 36.7* 35.7*  MCV 90.7 91.2 90.3 91.3 91.1  PLT 119* 117* 121* 132* 126*   BMP &GFR Recent Labs  Lab 01/23/24 0507 01/24/24 0340 01/25/24 0325 01/26/24 0758 01/27/24 0711 01/28/24 0630  NA 136 136 136 137 137 136  K 3.6 3.2* 4.1 4.2 3.9 3.7  CL 101 101 101 103 104 105  CO2 26 27 24 25 23 22   GLUCOSE 91 88 92 100* 105* 94  BUN 8 8 5* <5* 5* 5*  CREATININE 0.77 0.75 0.73 0.78 0.79 0.75  CALCIUM 8.9 8.5* 9.0 9.2 9.1 8.8*  MG 2.3 2.0 2.0 2.0 2.0 2.0  PHOS 2.7  --   --   --   --   --    Estimated Creatinine Clearance: 61.9 mL/min (by C-G formula based on SCr of 0.75 mg/dL). Liver & Pancreas: Recent Labs  Lab 01/23/24 0507  AST 27  ALT 12  ALKPHOS 81  BILITOT 1.1  PROT 6.5  ALBUMIN 3.9   No results for input(s): "LIPASE", "AMYLASE" in the last 168 hours. No results for input(s): "AMMONIA" in the last 168 hours. Diabetic: No results for input(s): "  HGBA1C" in the last 72 hours. No results for input(s): "GLUCAP" in the last 168 hours. Cardiac Enzymes: No results for input(s): "CKTOTAL", "CKMB", "CKMBINDEX", "TROPONINI" in the last 168 hours. No results for input(s): "PROBNP" in the last 8760 hours. Coagulation Profile: Recent Labs  Lab 01/24/24 0340 01/25/24 0325 01/26/24 0758 01/27/24 0711 01/28/24 0630  INR 2.2* 1.7* 1.3* 1.2 1.2   Thyroid Function Tests: No results for input(s): "TSH", "T4TOTAL", "FREET4", "T3FREE", "THYROIDAB" in the last 72 hours. Lipid Profile: No results for input(s): "CHOL", "HDL", "LDLCALC", "TRIG", "CHOLHDL", "LDLDIRECT" in the last 72 hours.  Anemia Panel: No results for input(s): "VITAMINB12", "FOLATE", "FERRITIN", "TIBC", "IRON", "RETICCTPCT" in the last 72 hours. Urine analysis:    Component Value Date/Time   COLORURINE YELLOW 07/31/2012 1452   APPEARANCEUR Clear 11/20/2022 0000   LABSPEC 1.009 07/31/2012 1452    PHURINE 7.0 07/31/2012 1452   GLUCOSEU Negative 11/20/2022 0000   HGBUR NEGATIVE 07/31/2012 1452   BILIRUBINUR Negative 11/20/2022 0000   KETONESUR 15 (A) 07/31/2012 1452   PROTEINUR Negative 11/20/2022 0000   PROTEINUR NEGATIVE 07/31/2012 1452   UROBILINOGEN negative 11/03/2013 1602   UROBILINOGEN 0.2 07/31/2012 1452   NITRITE Negative 11/20/2022 0000   NITRITE NEGATIVE 07/31/2012 1452   LEUKOCYTESUR Negative 11/20/2022 0000   Sepsis Labs: Invalid input(s): "PROCALCITONIN", "LACTICIDVEN"  Microbiology: No results found for this or any previous visit (from the past 240 hours).  Radiology Studies: CARDIAC CATHETERIZATION Result Date: 01/28/2024 Images from the original result were not included. Coronary and bypass graft angiography 01/28/2024: LM: Normal LAD: Mid 50% disease          Diag 2 ostial 70%, mid 60% disease LIMA-LAD attretic SVG-diag (likely insertion point) ostially occluded Lcx: Mid 70% stenoses on either side of aneurysmal area           OM3 ostial 95% stenosis SVG-OM3 (likely insertion point) occluded in proximal section with contrast dye hangup (likely culprit) RCA: Prox tandem 95% stenoses, followed by 100% occlusion SVG-RPDA/RPLA: Widely patent LVEDP could not be preformed SVG-OM3 not salvagable OM3 too small to intervene Recommend medical treatment Consider Aspirin and Plavix for 1 month, then stop Aspirin. Patient previously on warfarin for Afib. Consider initiating DOAC. Manish Emiliano Dyer, MD      Hildred Pharo T. Franko Hilliker [facility]  If 7PM-7AM, please contact night-coverage [email or web address] 01/28/2024, 11:26 AM

## 2024-01-28 NOTE — Care Management Important Message (Signed)
Important Message  Patient Details  Name: Dalton Vaughan MRN: [id number] Date of Birth: January 29, 1929   Important Message Given:  Yes - Medicare IM     Renie Ora 01/28/2024, 2:53 PM

## 2024-01-28 NOTE — Progress Notes (Signed)
PHARMACY - ANTICOAGULATION CONSULT NOTE  Pharmacy Consult for heparin Indication: chest pain/ACS  Allergies  Allergen Reactions   Paxil [Paroxetine Hcl] Palpitations   Codeine Other (See Comments)   Eliquis [Apixaban] Other (See Comments)    dizziness   Pravachol [Pravastatin Sodium] Other (See Comments)    myalgias   Zetia [Ezetimibe] Other (See Comments)    myalgias   Penicillins Hives, Rash and Other (See Comments)    Happened in 1959   Vytorin [Ezetimibe-Simvastatin] Other (See Comments)    myalgias   Patient Measurements: Height: 6' (182.9 cm) Weight: 74.3 kg (163 lb 12.8 oz) IBW/kg (Calculated) : 77.6 Heparin Dosing Weight: 79.6 kg  Vital Signs: Temp: 98 F (36.7 C) (02/11 0411) Temp Source: Oral (02/11 0411) BP: 140/83 (02/11 0411) Pulse Rate: 71 (02/11 0411)  Labs: Recent Labs    01/26/24 0758 01/27/24 0711 01/28/24 0630  HGB 12.9* 12.2* 11.9*  HCT 38.2* 36.7* 35.7*  PLT 121* 132* 126*  LABPROT 16.4* 15.4* 15.5*  INR 1.3* 1.2 1.2  HEPARINUNFRC 0.34 0.51 0.50  CREATININE 0.78 0.79 0.75    Estimated Creatinine Clearance: 61.9 mL/min (by C-G formula based on SCr of 0.75 mg/dL).   Medical History: Past Medical History:  Diagnosis Date   Anal fissure    Arthritis    Atrial fibrillation (HCC)    BPH (benign prostatic hypertrophy)    CAD (coronary artery disease)    Dr. Donnie Aho   Cataract    COVID-19    Depression 2004   Diverticulosis    GERD (gastroesophageal reflux disease)    History of TIAs    Hyperlipidemia    Hypertension    Hypertensive heart disease without CHF    Hypothyroidism    Internal hemorrhoids    Lumbar disc disease    Oral bleeding 08/20/2012   Following molar tooth extraction July, 2013   Pneumonia    Ruptured disk 1995   Shingles    Thyroid disease    Vitamin D deficiency    Vitreous detachment    Assessment: 89 y/o male presenting with numbness around lips and dizziness. PMH significant for sick sinus syndrome  status post pacemaker, atrial fibrillation on warfarin, and CAD status post CABG. In ED, troponin level found to be elevated. Pharmacy has been consulted to initiate heparin infusion.  - heparin level 0.5 is therapeutic on heparin 1100 units/hr.   -plt= 126 (history of low plt) -INR= 1.2  Home warfarin regimen: 5 mg daily on Wed & Fri, 2.5 mg all other days (Total weekly dose of 22.5 mg) Last dose: 2.5 mg on evening of 01/21/24   Goal of Therapy:  Heparin level 0.3-0.7 units/ml Monitor platelets by anticoagulation protocol: Yes   Plan:  Continue Heparin at 1150/hr Daily heparin level, PT/INR and CBC Will follow plans post cath  Harland German, PharmD Clinical Pharmacist **Pharmacist phone directory can now be found on [email or web address] ([email or web address]).  Listed under [facility].

## 2024-01-28 NOTE — Progress Notes (Addendum)
Rounding Note    Patient Name: Dalton Vaughan Date of Encounter: 01/28/2024  [facility] Cardiologist: Norman Herrlich, MD   Subjective   Patient is in restroom and walks back to bed without any difficulty. Patient denies any CP or SOB. Patient admits to on going gas but otherwise feels fine.   Inpatient Medications    Scheduled Meds:  [MAR Hold] amLODipine  5 mg Oral Daily   [MAR Hold] aspirin EC  81 mg Oral Daily   [MAR Hold] doxazosin  8 mg Oral QHS   [MAR Hold] ezetimibe  10 mg Oral Daily   [MAR Hold] isosorbide mononitrate  30 mg Oral Daily   [MAR Hold] levothyroxine  50 mcg Oral Q0600   [MAR Hold] pantoprazole  40 mg Oral Daily   [MAR Hold] ramipril  10 mg Oral Daily   [MAR Hold] rosuvastatin  10 mg Oral QODAY   [MAR Hold] senna  1 tablet Oral QHS   Continuous Infusions:  heparin Stopped (01/28/24 0835)   PRN Meds: [ZOX Hold] acetaminophen **OR** [MAR Hold] acetaminophen, [MAR Hold] alum & mag hydroxide-simeth, fentaNYL, midazolam, [MAR Hold] nitroGLYCERIN, [MAR Hold] polyethylene glycol, [MAR Hold] prochlorperazine, [MAR Hold] simethicone   Vital Signs    Vitals:   01/27/24 1145 01/27/24 2028 01/28/24 0411 01/28/24 0907  BP: 117/71 123/71 (!) 140/83   Pulse: 72  71   Resp: 16 19 16    Temp: 97.8 F (36.6 C) 98.7 F (37.1 C) 98 F (36.7 C)   TempSrc: Oral Oral Oral   SpO2: 96% 97% 97% 97%  Weight:      Height:        Intake/Output Summary (Last 24 hours) at 01/28/2024 0910 Last data filed at 01/27/2024 1537 Gross per 24 hour  Intake 955.71 ml  Output --  Net 955.71 ml      01/23/2024    6:26 PM 01/22/2024    2:32 PM 01/22/2024    2:30 PM  Last 3 Weights  Weight (lbs) 163 lb 12.8 oz 175 lb 7.8 oz 175 lb 7.8 oz  Weight (kg) 74.299 kg 79.6 kg 79.6 kg      Telemetry    Atrial fibrillation, V paced, noted some brief episodes of RVR in 130-140' with ambulation - Personally Reviewed  Physical Exam   GEN: Walking from restroom without any  assistance or acute distress. Son is at bed side.  Neck: No JVD Cardiac: Irregular rate and rhythm, Grade 2 systolic murmur, radial pulse 2+ Respiratory: Clear to auscultation bilaterally. GI: Soft, nontender, non-distended  MS: No edema; No deformity. Neuro:  Nonfocal  Psych: Normal affect   Labs    High Sensitivity Troponin:   Recent Labs  Lab 01/22/24 1527 01/22/24 1808 01/22/24 2224 01/23/24 0009  TROPONINIHS 808* 876* 1,368* 1,257*     Chemistry Recent Labs  Lab 01/23/24 0507 01/24/24 0340 01/26/24 0758 01/27/24 0711 01/28/24 0630  NA 136   < > 137 137 136  K 3.6   < > 4.2 3.9 3.7  CL 101   < > 103 104 105  CO2 26   < > 25 23 22   GLUCOSE 91   < > 100* 105* 94  BUN 8   < > <5* 5* 5*  CREATININE 0.77   < > 0.78 0.79 0.75  CALCIUM 8.9   < > 9.2 9.1 8.8*  MG 2.3   < > 2.0 2.0 2.0  PROT 6.5  --   --   --   --  ALBUMIN 3.9  --   --   --   --   AST 27  --   --   --   --   ALT 12  --   --   --   --   ALKPHOS 81  --   --   --   --   BILITOT 1.1  --   --   --   --   GFRNONAA >60   < > >60 >60 >60  ANIONGAP 9   < > 9 10 9    < > = values in this interval not displayed.    Lipids  Recent Labs  Lab 01/24/24 0340  CHOL 150  TRIG 32  HDL 43  LDLCALC 101*  CHOLHDL 3.5    Hematology Recent Labs  Lab 01/26/24 0758 01/27/24 0711 01/28/24 0630  WBC 7.0 6.1 6.7  RBC 4.23 4.02* 3.92*  HGB 12.9* 12.2* 11.9*  HCT 38.2* 36.7* 35.7*  MCV 90.3 91.3 91.1  MCH 30.5 30.3 30.4  MCHC 33.8 33.2 33.3  RDW 14.7 14.7 14.8  PLT 121* 132* 126*   Thyroid No results for input(s): "TSH", "FREET4" in the last 168 hours.  BNPNo results for input(s): "BNP", "PROBNP" in the last 168 hours.  DDimer No results for input(s): "DDIMER" in the last 168 hours.   Radiology    No results found.  Cardiac Studies   Echo 01/23/24  1. Left ventricular ejection fraction, by estimation, is 65 to 70%. The  left ventricle has normal function. The left ventricle has no regional  wall motion  abnormalities. There is moderate asymmetric left ventricular  hypertrophy of the septal segment.  Left ventricular diastolic parameters are indeterminate.   2. Right ventricular systolic function is normal. The right ventricular  size is normal. There is normal pulmonary artery systolic pressure. The  estimated right ventricular systolic pressure is 30.8 mmHg.   3. Left atrial size was severely dilated.   4. Right atrial size was severely dilated.   5. The mitral valve is degenerative. Mild to moderate mitral valve  regurgitation.   6. The aortic valve is tricuspid. There is mild calcification of the  aortic valve. Aortic valve regurgitation is not visualized.   7. Aortic dilatation noted. There is mild dilatation of the aortic root,  measuring 41 mm.   8. The inferior vena cava is dilated in size with >50% respiratory  variability, suggesting right atrial pressure of 8 mmHg.    Assessment & Plan   NSTEMI CAD s/p CABG in 1999 HLD  - He underwent CABG in 1999 and is unaware of any PCI or STENT placements.  - Per chart review, Patient transferred from [facility] Initially presented with worsening bilateral shoulder pain radiating to left arm at rest or with activity. Patient is otherwise baseline functional and active with any any discomfort.  - hstn 1368 > 1257 -  ECHO 01/23/24: LVEF 65-70%, No RWMA, Moderate LV hypertrophy, L/R atrial  severely dilated, Mild MVR, Mild calcification of Aortic Valve, mild Aortic root dilation  - Previously intolerant to high intensity statins and beta blocker - Continue ASA 81 mg, Imdur 30 mg, Crestor 10 mg every other day, Zetia 10 mg, IV heparin - LDL 101 this admission. Would consider PCSK9i in outpatient follow up - Yesterday, he was evaluated by mobility specialist. He was able to complete without assistance and successfully completed without any complaints.  - Was scheduled for LCA yesterday, but moved to today given emergent  cases on the schedule.   - INR remains stable at 1.2 today   Chronic Thrombocytopenia  -  119 >117>121> 132 > 126 (today), would not cath if PLT <50k    Permanent Atrial Fibrillation  - Home medication is warfarin, will continue post cath  - currently on heparin IV  - rate controlled generally without AV nodal agents, some RVR with ambulation this morning   Tachybradycardia syndrome  Permanent Pacemaker in situ  - s/p [employer] placement at [facility] in 2021 - per chart review, interrogate device, normal device function    HTN  - BP is stable, Continue Norsvac 5 mg and Ramipril 10mg   For questions or updates, please contact [facility] Please consult [email or web address] for contact info under    Signed, Laverda Page, NP  01/28/2024, 9:10 AM     Patient seen, examined. Available data reviewed. Agree with findings, assessment, and plan as outlined by Laverda Page, NP.  The patient is independently interviewed and examined.  He is an alert, oriented, elderly male in no distress.  HEENT is normal, JVP is normal, lungs are clear bilaterally, heart is irregularly irregular with no murmur gallop, abdomen is soft and nontender, extremities have no edema, the left radial access site is clear with a TR band currently in place.  The patient's cardiac catheterization results were reviewed and demonstrates an atretic LIMA to LAD with moderate nonobstructive proximal LAD stenosis, chronic occlusion of the saphenous vein graft to diagonal, and interval occlusion of an SVG to OM 3 graft which is likely the patient's culprit.  The native RCA is totally occluded in the saphenous vein graft sequenced to the right PDA and PLA is widely patent.  Medical therapy is recommended.  The vein graft is felt to be too small for PCI.  In reviewing the patient's history, he has been on warfarin.  He tried apixaban over a decade ago and had a dizzy spell about 3 weeks after starting it.  He ended up stopping that medication but he really  does not know if it was related to his symptoms.  We discussed the pros and cons of warfarin anticoagulation versus a direct oral anticoagulant drug.  After shared decision making conversation, we will change him to apixaban 5 mg twice daily and would recommend starting this tomorrow morning.  And I will get him out to about 24 hours from his cardiac catheterization procedure.  Would start him on clopidogrel for antiplatelet therapy and continue for at least 6 months.  With his remote CABG and non-STEMI, I would favor keeping him on some type of antiplatelet therapy long-term as tolerated as long as he does not have bleeding problems.  I think the patient is stable from a cardiac perspective for hospital discharge.  Otherwise as outlined above.  Tonny Bollman, M.D. 01/28/2024 12:33 PM

## 2024-01-28 NOTE — Interval H&P Note (Signed)
History and Physical Interval Note:  01/28/2024 8:49 AM  Dalton Vaughan  has presented today for surgery, with the diagnosis of NSTEMI.  The various methods of treatment have been discussed with the patient and family. After consideration of risks, benefits and other options for treatment, the patient has consented to  Procedure(s): LEFT HEART CATH AND CORS/GRAFTS ANGIOGRAPHY (N/A) as a surgical intervention.  The patient's history has been reviewed, patient examined, no change in status, stable for surgery.  I have reviewed the patient's chart and labs.  Questions were answered to the patient's satisfaction.     Kevyn Wengert J Terrin Meddaugh

## 2024-01-28 NOTE — Progress Notes (Signed)
CARDIAC REHAB PHASE I   Referral for CRP2 placed for AP per protocol. Pt scheduled for discharge home later today.     Woodroe Chen, RN BSN 01/28/2024 1:29 PM

## 2024-01-28 NOTE — Discharge Summary (Signed)
Physician Discharge Summary  Mavin Dyke RUE:[id number] DOB: 02-22-1929 DOA: 01/22/2024  PCP: Raliegh Ip, DO  Admit date: 01/22/2024 Discharge date: 01/28/24  Admitted From: Home. Disposition: Home Recommendations for Outpatient Follow-up:  Follow up with PCP in 1 to 2 weeks Cardiology to arrange outpatient follow-up Reassess blood pressure, CMP and CBC at follow-up Please follow up on the following pending results: None  Home Health: No need identified. Equipment/Devices: No need identified  Discharge Condition: Stable CODE STATUS: Full code  Follow-up Information     Raliegh Ip, DO. Schedule an appointment as soon as possible for a visit in 1 week(s).   Specialty: Family Medicine Contact information: [address] [place] [address] [phone]                 Hospital course 89 year old M with PMH of A-fib on warfarin, SSS/PPM, CAD/CABG in 1999, HTN, BPH, hypothyroidism, cervicalgia and chronic bilateral shoulder pain presented to [facility] with numbness sensation around his lips, and found to have non-STEMI.  No report of chest pain or dyspnea but some lightheadedness associated with numbness.  Reportedly had intermittent numbness for 5 to 6 years.  Also chronic bilateral shoulder pain on and off.  He is followed by neurosurgery for his cervicalgia.    In ED, troponin elevated to 808 and trended up to 1368.  EKG rate controlled A-fib with nonspecific ST and T wave changes.  CXR with cardiac enlargement and small left pleural effusion.  CT head without acute finding.  Cardiology consulted.  Started on heparin drip.    Patient underwent LHC on 01/28/2024 that showed multivessel CAD.  Cardiology recommended medical management, and cleared patient for discharge.  He is discharged on Plavix, Imdur, increased dose of Crestor and Zetia.  He is already on ramipril.  Discontinued Maxzide.  Anticoagulation changed to Eliquis.  Cardiology to arrange  outpatient follow-up.  See individual problem list below for more.   Problems addressed during this hospitalization NSTEMI: Patient presented with lip numbness and shoulder pain which are chronic.  No report of cardiopulmonary symptoms other than some lightheadedness.  Initial troponin 808 and peaked at 1368.  EKG with nonspecific ST and T wave change.  Already on warfarin at home.  INR therapeutic.  TTE with LVEF of 88 to 70%, moderate asymmetric LVH, severe LAE and RAE and mild to moderate MR.  LDL 101.  Left upper catheterization on 01/28/2024 with multivessel CAD.  Cardiology recommended medical management and cleared patient for discharge on Plavix, Eliquis, Crestor, Imdur and Zetia.  He is already on ramipril.  Discontinued Maxzide and warfarin.  Cardiology to arrange outpatient follow-up.Marland Kitchen   History of CAD/CABG in 1999 -Management as above.   Intermittent lip numbness: Unclear etiology.  Reportedly ongoing for years.  CT head without acute finding.  Seems to have chronic expressive aphasia as well.  No further numbness since hospitalization.   Chronic bilateral shoulder pain: MRI angio neck in 08/2023 without significant finding.  History of cervicalgia.  Followed by [facility].  MRI cervical spine in 11/2023 showed generalized cervical spine degeneration with foraminal impingement bilaterally at C3-4 to C5-6 and on the right at C6-7. Up to mild spinal stenosis at C4-5. -Has outpatient MRI brain scheduled for 02/08/2024. -Outpatient follow-up.   Prolonged QT interval: Resolved.  QTc 523> 420. -Avoid QT prolonging drugs   Chronic thrombocytopenia-stable. -Recheck CBC at follow-up.   Permanent atrial fibrillation/tachybradycardia syndrome: Was on warfarin.  INR 1.2. -Transitioned to Eliquis.  Warfarin discontinued.   Essential hypertension: Normotensive -Continue amlodipine, ramipril and Imdur. -Discontinued Maxide.   Mixed hyperlipidemia -Continue Crestor and Zetia    Acquired hypothyroidism -Continue Synthroid   BPH without LUTS -Continue doxazosin   Hypokalemia: Resolved.              Time spent 35 minutes  Vital signs Vitals:   01/28/24 1021 01/28/24 1026 01/28/24 1040 01/28/24 1119  BP: 124/70 124/70 (!) 140/66 126/71  Pulse: 84 (!) 0 83 69  Temp:   97.7 F (36.5 C) 97.7 F (36.5 C)  Resp: 15  16 16   Height:      Weight:      SpO2: 94%  94% 95%  TempSrc:   Oral Axillary  BMI (Calculated):         Discharge exam  GENERAL: No apparent distress.  Nontoxic. HEENT: MMM.  Vision and hearing grossly intact.  NECK: Supple.  No apparent JVD.  RESP:  No IWOB.  Fair aeration bilaterally. CVS: Irregular rhythm.  Normal rate.  Heart sounds normal.  ABD/GI/GU: BS+. Abd soft, NTND.  MSK/EXT:  Moves extremities. No apparent deformity. No edema.  SKIN: no apparent skin lesion or wound NEURO: Awake and alert. Oriented appropriately.  No apparent focal neuro deficit. PSYCH: Calm. Normal affect.   Discharge Instructions Discharge Instructions     Amb Referral to Cardiac Rehabilitation   Complete by: As directed    Diagnosis: NSTEMI   After initial evaluation and assessments completed: Virtual Based Care may be provided alone or in conjunction with Phase 2 Cardiac Rehab based on patient barriers.: Yes   Intensive Cardiac Rehabilitation (ICR) [facility] location only OR Traditional Cardiac Rehabilitation (TCR) *If criteria for ICR are not met will enroll in TCR [facility] only): Yes   Diet - low sodium heart healthy   Complete by: As directed    Discharge instructions   Complete by: As directed    It has been a pleasure taking care of you!  You were hospitalized due to chest pain for which you had heart catheterization that showed narrowing of blood vessels in your heart.  You have been started on new medication to treat this.  We have also changed warfarin to Eliquis. Please review your new medication list and the directions on your medications  before you take them.    Follow-up with your primary care doctor in 1 to 2 weeks or sooner if needed.  Follow-up with cardiology per their recommendation.   Take care,   Increase activity slowly   Complete by: As directed       Allergies as of 01/28/2024       Reactions   Paxil [paroxetine Hcl] Palpitations   Codeine Other (See Comments)   Eliquis [apixaban] Other (See Comments)   dizziness   Pravachol [pravastatin Sodium] Other (See Comments)   myalgias   Zetia [ezetimibe] Other (See Comments)   myalgias   Penicillins Hives, Rash, Other (See Comments)   Happened in 1959   Vytorin [ezetimibe-simvastatin] Other (See Comments)   myalgias        Medication List     STOP taking these medications    Calcium + D3 600-200 MG-UNIT Tabs   triamterene-hydrochlorothiazide 37.5-25 MG tablet Commonly known as: MAXZIDE-25   warfarin 5 MG tablet Commonly known as: COUMADIN       TAKE these medications    amLODipine 5 MG tablet Commonly known as: NORVASC Take 1 tablet (5 mg total) by mouth daily.   apixaban  5 MG Tabs tablet Commonly known as: ELIQUIS Take 1 tablet (5 mg total) by mouth 2 (two) times daily.   ascorbic acid 500 MG tablet Commonly known as: VITAMIN C Take 500 mg by mouth daily.   azelastine 0.1 % nasal spray Commonly known as: ASTELIN Place 1 spray into both nostrils 2 (two) times daily.   clopidogrel 75 MG tablet Commonly known as: PLAVIX Take 1 tablet (75 mg total) by mouth daily.   doxazosin 8 MG tablet Commonly known as: CARDURA Take 1 tablet (8 mg total) by mouth at bedtime.   ezetimibe 10 MG tablet Commonly known as: ZETIA Take 1 tablet (10 mg total) by mouth daily. Start taking on: January 29, 2024   isosorbide mononitrate 30 MG 24 hr tablet Commonly known as: IMDUR Take 1 tablet (30 mg total) by mouth daily. Start taking on: January 29, 2024   levothyroxine 50 MCG tablet Commonly known as: SYNTHROID Take 1 tablet (50 mcg  total) by mouth daily before breakfast.   meclizine 25 MG tablet Commonly known as: ANTIVERT Take 25 mg by mouth daily as needed for dizziness.   mirtazapine 15 MG tablet Commonly known as: REMERON Take 1 tablet (15 mg total) by mouth at bedtime. What changed: how much to take   multivitamin tablet Take 1 tablet by mouth daily.   Probiotic Acidophilus Caps Take 1 capsule by mouth daily.   ramipril 10 MG capsule Commonly known as: ALTACE TAKE 1 CAPSULE DAILY   rosuvastatin 10 MG tablet Commonly known as: Crestor Take 1 tablet (10 mg total) by mouth every other day. Start taking on: January 30, 2024 What changed:  medication strength how much to take how to take this when to take this additional instructions   SIMETHICONE PO Take 1 tablet by mouth as needed (gas).        Consultations: Cardiology  Procedures/Studies:   CARDIAC CATHETERIZATION Result Date: 01/28/2024 Images from the original result were not included. Coronary and bypass graft angiography 01/28/2024: LM: Normal LAD: Mid 50% disease          Diag 2 ostial 70%, mid 60% disease LIMA-LAD attretic SVG-diag (likely insertion point) ostially occluded Lcx: Mid 70% stenoses on either side of aneurysmal area           OM3 ostial 95% stenosis SVG-OM3 (likely insertion point) occluded in proximal section with contrast dye hangup (likely culprit) RCA: Prox tandem 95% stenoses, followed by 100% occlusion SVG-RPDA/RPLA: Widely patent LVEDP could not be preformed SVG-OM3 not salvagable OM3 too small to intervene Recommend medical treatment Consider Aspirin and Plavix for 1 month, then stop Aspirin. Patient previously on warfarin for Afib. Consider initiating DOAC. Elder Negus, MD  ECHOCARDIOGRAM COMPLETE Result Date: 01/23/2024    ECHOCARDIOGRAM REPORT   Patient Name:   TRUTH BAROT Date of Exam: 01/23/2024 Medical Rec #:  [id number]   Height:       72.0 in Accession #:    [id number]  Weight:       175.5 lb Date of  Birth:  1929/10/22   BSA:          2.015 m Patient Age:    89 years    BP:           141/81 mmHg Patient Gender: M           HR:           72 bpm. [facility] Procedure: 2D Echo, Cardiac Doppler and Color Doppler  Indications:    NSTEMI  History:        Patient has prior history of Echocardiogram examinations, most                 recent 11/19/2018. CAD, TIA; Arrythmias:Atrial Fibrillation.  Sonographer:    Celesta Gentile RCS Referring Phys: [id number] Ellsworth Lennox IMPRESSIONS  1. Left ventricular ejection fraction, by estimation, is 65 to 70%. The left ventricle has normal function. The left ventricle has no regional wall motion abnormalities. There is moderate asymmetric left ventricular hypertrophy of the septal segment. Left ventricular diastolic parameters are indeterminate.  2. Right ventricular systolic function is normal. The right ventricular size is normal. There is normal pulmonary artery systolic pressure. The estimated right ventricular systolic pressure is 30.8 mmHg.  3. Left atrial size was severely dilated.  4. Right atrial size was severely dilated.  5. The mitral valve is degenerative. Mild to moderate mitral valve regurgitation.  6. The aortic valve is tricuspid. There is mild calcification of the aortic valve. Aortic valve regurgitation is not visualized.  7. Aortic dilatation noted. There is mild dilatation of the aortic root, measuring 41 mm.  8. The inferior vena cava is dilated in size with >50% respiratory variability, suggesting right atrial pressure of 8 mmHg. Comparison(s): Prior images unable to be directly viewed. FINDINGS  Left Ventricle: Left ventricular ejection fraction, by estimation, is 65 to 70%. The left ventricle has normal function. The left ventricle has no regional wall motion abnormalities. The left ventricular internal cavity size was normal in size. There is  moderate asymmetric left ventricular hypertrophy of the septal segment. Left ventricular diastolic  function could not be evaluated due to atrial fibrillation. Left ventricular diastolic parameters are indeterminate. Right Ventricle: The right ventricular size is normal. No increase in right ventricular wall thickness. Right ventricular systolic function is normal. There is normal pulmonary artery systolic pressure. The tricuspid regurgitant velocity is 2.39 m/s, and  with an assumed right atrial pressure of 8 mmHg, the estimated right ventricular systolic pressure is 30.8 mmHg. Left Atrium: Left atrial size was severely dilated. Right Atrium: Right atrial size was severely dilated. Pericardium: There is no evidence of pericardial effusion. Mitral Valve: The mitral valve is degenerative in appearance. There is mild calcification of the mitral valve leaflet(s). Mild to moderate mitral valve regurgitation. Tricuspid Valve: The tricuspid valve is grossly normal. Tricuspid valve regurgitation is mild. Aortic Valve: The aortic valve is tricuspid. There is mild calcification of the aortic valve. There is mild aortic valve annular calcification. Aortic valve regurgitation is not visualized. Pulmonic Valve: The pulmonic valve was grossly normal. Pulmonic valve regurgitation is mild to moderate. Aorta: Aortic dilatation noted. There is mild dilatation of the aortic root, measuring 41 mm. Venous: The inferior vena cava is dilated in size with greater than 50% respiratory variability, suggesting right atrial pressure of 8 mmHg. IAS/Shunts: No atrial level shunt detected by color flow Doppler. Additional Comments: A device lead is visualized.  LEFT VENTRICLE PLAX 2D LVIDd:         4.30 cm   Diastology LVIDs:         2.50 cm   LV e' medial:    7.62 cm/s LV PW:         1.20 cm   LV E/e' medial:  11.8 LV IVS:        1.40 cm   LV e' lateral:   15.00 cm/s LVOT diam:     2.20 cm  LV E/e' lateral: 6.0 LV SV:         69 LV SV Index:   34 LVOT Area:     3.80 cm  RIGHT VENTRICLE RV S prime:     12.50 cm/s TAPSE (M-mode): 1.8 cm LEFT  ATRIUM              Index        RIGHT ATRIUM           Index LA diam:        5.10 cm  2.53 cm/m   RA Area:     33.20 cm LA Vol (A2C):   153.0 ml 75.92 ml/m  RA Volume:   125.00 ml 62.02 ml/m LA Vol (A4C):   169.0 ml 83.85 ml/m LA Biplane Vol: 159.0 ml 78.89 ml/m  AORTIC VALVE LVOT Vmax:   90.60 cm/s LVOT Vmean:  67.150 cm/s LVOT VTI:    0.182 m  AORTA Ao Root diam: 4.10 cm MITRAL VALVE               TRICUSPID VALVE MV Area (PHT): 3.12 cm    TR Peak grad:   22.8 mmHg MV Decel Time: 243 msec    TR Vmax:        239.00 cm/s MR Peak grad: 74.3 mmHg MR Mean grad: 43.0 mmHg    SHUNTS MR Vmax:      431.00 cm/s  Systemic VTI:  0.18 m MR Vmean:     307.0 cm/s   Systemic Diam: 2.20 cm MV E velocity: 89.80 cm/s MV A velocity: 34.30 cm/s MV E/A ratio:  2.62 Nona Dell MD Electronically signed by Nona Dell MD Signature Date/Time: 01/23/2024/2:28:07 PM    Final    CT Head Wo Contrast Result Date: 01/22/2024 CLINICAL DATA:  Lip numbness. Dizziness and numbness in the lips which began yesterday afternoon. EXAM: CT HEAD WITHOUT CONTRAST TECHNIQUE: Contiguous axial images were obtained from the base of the skull through the vertex without intravenous contrast. RADIATION DOSE REDUCTION: This exam was performed according to the departmental dose-optimization program which includes automated exposure control, adjustment of the mA and/or kV according to patient size and/or use of iterative reconstruction technique. COMPARISON:  MR head without contrast 03/05/2017. FINDINGS: Brain: Mild atrophy and white matter changes are similar to the prior exam, likely within normal limits for age. A remote lacunar infarct is again noted within the left thalamus. No acute infarct, hemorrhage, or mass lesion is present. The ventricles are of normal size. No significant extraaxial fluid collection is present. The brainstem and cerebellum are within normal limits. Midline structures are within normal limits. Vascular: No hyperdense  vessel or unexpected calcification. Skull: Calvarium is intact. No focal lytic or blastic lesions are present. No significant extracranial soft tissue lesion is present. Sinuses/Orbits: The paranasal sinuses and mastoid air cells are clear. Bilateral lens replacements are noted. Globes and orbits are otherwise unremarkable. IMPRESSION: 1. No acute intracranial abnormality or significant interval change. 2. Stable atrophy and white matter disease, likely within normal limits for age. 3. Remote lacunar infarct of the left thalamus. Electronically Signed   By: Marin Roberts M.D.   On: 01/22/2024 17:07   DG Chest 2 View Result Date: 01/22/2024 CLINICAL DATA:  Chest pain and dizziness today. History of atrial fibrillation, coronary artery disease, hypertension. EXAM: CHEST - 2 VIEW COMPARISON:  09/13/2023 FINDINGS: Postoperative changes in the mediastinum. Cardiac pacemaker. Cardiac enlargement. No vascular congestion, edema, or consolidation. Small left pleural effusion with mild  basilar atelectasis. No pneumothorax. Mediastinal contours appear intact. Calcification of the aorta. Degenerative changes in the spine and shoulders. IMPRESSION: Cardiac enlargement. Small left pleural effusion with basilar atelectasis or infiltration. Electronically Signed   By: Burman Nieves M.D.   On: 01/22/2024 17:06       The results of significant diagnostics from this hospitalization (including imaging, microbiology, ancillary and laboratory) are listed below for reference.     Microbiology: No results found for this or any previous visit (from the past 240 hours).   Labs:  CBC: Recent Labs  Lab 01/24/24 0340 01/25/24 0325 01/26/24 0758 01/27/24 0711 01/28/24 0630  WBC 6.9 6.5 7.0 6.1 6.7  HGB 11.8* 11.8* 12.9* 12.2* 11.9*  HCT 35.3* 35.2* 38.2* 36.7* 35.7*  MCV 90.7 91.2 90.3 91.3 91.1  PLT 119* 117* 121* 132* 126*   BMP &GFR Recent Labs  Lab 01/23/24 0507 01/24/24 0340 01/25/24 0325  01/26/24 0758 01/27/24 0711 01/28/24 0630  NA 136 136 136 137 137 136  K 3.6 3.2* 4.1 4.2 3.9 3.7  CL 101 101 101 103 104 105  CO2 26 27 24 25 23 22   GLUCOSE 91 88 92 100* 105* 94  BUN 8 8 5* <5* 5* 5*  CREATININE 0.77 0.75 0.73 0.78 0.79 0.75  CALCIUM 8.9 8.5* 9.0 9.2 9.1 8.8*  MG 2.3 2.0 2.0 2.0 2.0 2.0  PHOS 2.7  --   --   --   --   --    Estimated Creatinine Clearance: 61.9 mL/min (by C-G formula based on SCr of 0.75 mg/dL). Liver & Pancreas: Recent Labs  Lab 01/23/24 0507  AST 27  ALT 12  ALKPHOS 81  BILITOT 1.1  PROT 6.5  ALBUMIN 3.9   No results for input(s): "LIPASE", "AMYLASE" in the last 168 hours. No results for input(s): "AMMONIA" in the last 168 hours. Diabetic: No results for input(s): "HGBA1C" in the last 72 hours. No results for input(s): "GLUCAP" in the last 168 hours. Cardiac Enzymes: No results for input(s): "CKTOTAL", "CKMB", "CKMBINDEX", "TROPONINI" in the last 168 hours. No results for input(s): "PROBNP" in the last 8760 hours. Coagulation Profile: Recent Labs  Lab 01/24/24 0340 01/25/24 0325 01/26/24 0758 01/27/24 0711 01/28/24 0630  INR 2.2* 1.7* 1.3* 1.2 1.2   Thyroid Function Tests: No results for input(s): "TSH", "T4TOTAL", "FREET4", "T3FREE", "THYROIDAB" in the last 72 hours. Lipid Profile: No results for input(s): "CHOL", "HDL", "LDLCALC", "TRIG", "CHOLHDL", "LDLDIRECT" in the last 72 hours. Anemia Panel: No results for input(s): "VITAMINB12", "FOLATE", "FERRITIN", "TIBC", "IRON", "RETICCTPCT" in the last 72 hours. Urine analysis:    Component Value Date/Time   COLORURINE YELLOW 07/31/2012 1452   APPEARANCEUR Clear 11/20/2022 0000   LABSPEC 1.009 07/31/2012 1452   PHURINE 7.0 07/31/2012 1452   GLUCOSEU Negative 11/20/2022 0000   HGBUR NEGATIVE 07/31/2012 1452   BILIRUBINUR Negative 11/20/2022 0000   KETONESUR 15 (A) 07/31/2012 1452   PROTEINUR Negative 11/20/2022 0000   PROTEINUR NEGATIVE 07/31/2012 1452   UROBILINOGEN  negative 11/03/2013 1602   UROBILINOGEN 0.2 07/31/2012 1452   NITRITE Negative 11/20/2022 0000   NITRITE NEGATIVE 07/31/2012 1452   LEUKOCYTESUR Negative 11/20/2022 0000   Sepsis Labs: Invalid input(s): "PROCALCITONIN", "LACTICIDVEN"   SIGNED:  Almon Hercules, MD  [facility] 01/28/2024, 1:04 PM

## 2024-01-29 ENCOUNTER — Telehealth: Payer: Self-pay | Admitting: *Deleted

## 2024-01-29 ENCOUNTER — Ambulatory Visit (HOSPITAL_COMMUNITY): Admission: RE | Admit: 2024-01-29 | Payer: Medicare Other | Source: Ambulatory Visit

## 2024-01-29 ENCOUNTER — Encounter (HOSPITAL_COMMUNITY): Payer: Self-pay | Admitting: Cardiology

## 2024-01-29 NOTE — Transitions of Care (Post Inpatient/ED Visit) (Signed)
01/29/2024  Name: Dalton Vaughan MRN: [id number] DOB: 1929-09-21  Today's TOC FU Call Status: Today's TOC FU Call Status:: Successful TOC FU Call Completed TOC FU Call Complete Date: 01/29/24 Patient's Name and Date of Birth confirmed.  Transition Care Management Follow-up Telephone Call Date of Discharge: 01/28/24 Discharge Facility: [facility]) Type of Discharge: Inpatient Admission Primary Inpatient Discharge Diagnosis:: NSTEMI How have you been since you were released from the hospital?: Better (pt states eating well, no issues w/ bowel/ bladder, ambulating without difficulty) Any questions or concerns?: No  Items Reviewed: Did you receive and understand the discharge instructions provided?: Yes Medications obtained,verified, and reconciled?: Yes (Medications Reviewed) Any new allergies since your discharge?: No Dietary orders reviewed?: Yes Type of Diet Ordered:: low sodium,  heart healthy Do you have support at home?: Yes People in Home: grandchild(ren) Name of Support/Comfort Primary Source: Jake Tsosie  grandson lives w/ pt Patient declined enrollment in [facility] 30 day program Patient has contact # for cardiac rehab and will call within a week to scheduled appointment Patient verbalizes understanding signs/ symptoms MI and importance of calling 911   Medications Reviewed Today: Medications Reviewed Today     Reviewed by Audrie Gallus, RN (Registered Nurse) on 01/29/24 at [phone]  Med List Status: <None>   Medication Order Taking? Sig Documenting Provider Last Dose Status Informant  amLODipine (NORVASC) 5 MG tablet [id number] Yes Take 1 tablet (5 mg total) by mouth daily. Delynn Flavin M, DO Taking Active Self  apixaban (ELIQUIS) 5 MG TABS tablet [id number] Yes Take 1 tablet (5 mg total) by mouth 2 (two) times daily. Almon Hercules, MD Taking Active   azelastine (ASTELIN) 0.1 % nasal spray [id number] Yes Place 1 spray into both nostrils 2 (two) times daily. Delynn Flavin M, DO Taking Active Self  clopidogrel (PLAVIX) 75 MG tablet [id number] Yes Take 1 tablet (75 mg total) by mouth daily. Almon Hercules, MD Taking Active   doxazosin (CARDURA) 8 MG tablet [id number] Yes Take 1 tablet (8 mg total) by mouth at bedtime. Delynn Flavin M, DO Taking Active Self  ezetimibe (ZETIA) 10 MG tablet [id number] Yes Take 1 tablet (10 mg total) by mouth daily. Almon Hercules, MD Taking Active   isosorbide mononitrate (IMDUR) 30 MG 24 hr tablet [id number] Yes Take 1 tablet (30 mg total) by mouth daily. Almon Hercules, MD Taking Active   Lactobacillus (PROBIOTIC ACIDOPHILUS) CAPS [id number] Yes Take 1 capsule by mouth daily. [provider] Taking Active Self  levothyroxine (SYNTHROID) 50 MCG tablet [id number] Yes Take 1 tablet (50 mcg total) by mouth daily before breakfast. Raliegh Ip, DO Taking Active Self  meclizine (ANTIVERT) 25 MG tablet [id number] Yes Take 25 mg by mouth daily as needed for dizziness. [provider] Taking Active Self  mirtazapine (REMERON) 15 MG tablet [id number] Yes Take 1 tablet (15 mg total) by mouth at bedtime.  Patient taking differently: Take 3.5 mg by mouth at bedtime.   Raliegh Ip, DO Taking Active Self           Med Note (WARD, ANGELICA G   Wed Jan 22, 2024  8:47 PM) Pt is trying to wean himself off of this medication  Multiple Vitamin (MULTIVITAMIN) tablet [id number] Yes Take 1 tablet by mouth daily. [provider] Taking Active Self  ramipril (ALTACE) 10 MG capsule [id number] Yes TAKE 1 CAPSULE DAILY Raliegh Ip, DO Taking Active Self  rosuvastatin (CRESTOR) 10 MG tablet [id number] Yes Take  1 tablet (10 mg total) by mouth every other day. Almon Hercules, MD Taking Active   SIMETHICONE PO [id number] Yes Take 1 tablet by mouth as needed (gas). [provider] Taking Active Self  vitamin C (ASCORBIC ACID) 500 MG tablet [id number] Yes Take 500 mg by mouth daily.  [provider] Taking Active  Self            Home Care and Equipment/Supplies: Were Home Health Services Ordered?: No Any new equipment or medical supplies ordered?: No  Functional Questionnaire: Do you need assistance with bathing/showering or dressing?: No Do you need assistance with meal preparation?: No Do you need assistance with eating?: No Do you have difficulty maintaining continence: No Do you need assistance with getting out of bed/getting out of a chair/moving?: No Do you have difficulty managing or taking your medications?: No  Follow up appointments reviewed: PCP Follow-up appointment confirmed?: Yes Date of PCP follow-up appointment?: 01/31/24 Follow-up Provider: Delynn Flavin  DO Specialist Hospital Follow-up appointment confirmed?: No Reason Specialist Follow-Up Not Confirmed:  (pt states Dr. Chales Abrahams in [facility] is cardiologist and will call today and scheduled follow up appt.) Do you need transportation to your follow-up appointment?: No Do you understand care options if your condition(s) worsen?: Yes-patient verbalized understanding  SDOH Interventions Today    Flowsheet Row Most Recent Value  SDOH Interventions   Food Insecurity Interventions Intervention Not Indicated  Housing Interventions Intervention Not Indicated  Transportation Interventions Intervention Not Indicated  Utilities Interventions Intervention Not Indicated       Irving Shows [facility], BSN RN Care Manager/ Transition of Care [place]/ [facility] [phone]

## 2024-01-31 ENCOUNTER — Ambulatory Visit: Payer: Medicare Other | Admitting: Family Medicine

## 2024-01-31 ENCOUNTER — Encounter: Payer: Self-pay | Admitting: Family Medicine

## 2024-01-31 VITALS — BP 124/63 | HR 84 | Temp 97.2°F | Ht 72.0 in | Wt 175.0 lb

## 2024-01-31 DIAGNOSIS — I482 Chronic atrial fibrillation, unspecified: Secondary | ICD-10-CM

## 2024-01-31 DIAGNOSIS — Z09 Encounter for follow-up examination after completed treatment for conditions other than malignant neoplasm: Secondary | ICD-10-CM

## 2024-01-31 DIAGNOSIS — L03114 Cellulitis of left upper limb: Secondary | ICD-10-CM

## 2024-01-31 DIAGNOSIS — M79602 Pain in left arm: Secondary | ICD-10-CM

## 2024-01-31 DIAGNOSIS — I214 Non-ST elevation (NSTEMI) myocardial infarction: Secondary | ICD-10-CM

## 2024-01-31 MED ORDER — CEFTRIAXONE SODIUM 1 G IJ SOLR
1.0000 g | Freq: Once | INTRAMUSCULAR | Status: AC
  Administered 2024-01-31: 1 g via INTRAMUSCULAR

## 2024-01-31 MED ORDER — CEPHALEXIN 500 MG PO CAPS: 500.0000 mg | ORAL_CAPSULE | Freq: Four times a day (QID) | ORAL | 0 refills | Status: AC

## 2024-01-31 MED ORDER — ROSUVASTATIN CALCIUM 10 MG PO TABS: 10.0000 mg | ORAL_TABLET | ORAL | 3 refills | Status: DC

## 2024-01-31 MED ORDER — HYDROCODONE-ACETAMINOPHEN 5-325 MG PO TABS: 1.0000 | ORAL_TABLET | Freq: Three times a day (TID) | ORAL | 0 refills | Status: DC | PRN

## 2024-01-31 NOTE — Progress Notes (Signed)
Subjective: VH:QION f/u NSTEMI PCP: Raliegh Ip, DO HPI:Dalton Vaughan is a 89 y.o. male presenting to clinic today for:  1. NSTEMI He has recovered from the NSTEMI but has LUE swelling and pain since the catheterization. The swelling was slightly worse yesterday but is still pretty significant today.  He reports tenderness to touch, he used some Tylenol and got minimal relief with the Tylenol.  He reports no fevers or drainage from the area.  Has an appoint with cardiology on Tuesday   ROS: Per HPI  Allergies  Allergen Reactions   Paxil [Paroxetine Hcl] Palpitations   Codeine Other (See Comments)   Eliquis [Apixaban] Other (See Comments)    dizziness   Pravachol [Pravastatin Sodium] Other (See Comments)    myalgias   Zetia [Ezetimibe] Other (See Comments)    myalgias   Penicillins Hives, Rash and Other (See Comments)    Happened in 1959   Vytorin [Ezetimibe-Simvastatin] Other (See Comments)    myalgias   Past Medical History:  Diagnosis Date   Anal fissure    Arthritis    Atrial fibrillation (HCC)    BPH (benign prostatic hypertrophy)    CAD (coronary artery disease)    Dr. Donnie Aho   Cataract    COVID-19    Depression 2004   Diverticulosis    GERD (gastroesophageal reflux disease)    History of TIAs    Hyperlipidemia    Hypertension    Hypertensive heart disease without CHF    Hypothyroidism    Internal hemorrhoids    Lumbar disc disease    Oral bleeding 08/20/2012   Following molar tooth extraction July, 2013   Pneumonia    Ruptured disk 1995   Shingles    Thyroid disease    Vitamin D deficiency    Vitreous detachment     Current Outpatient Medications:    amLODipine (NORVASC) 5 MG tablet, Take 1 tablet (5 mg total) by mouth daily., Disp: 100 tablet, Rfl: 3   apixaban (ELIQUIS) 5 MG TABS tablet, Take 1 tablet (5 mg total) by mouth 2 (two) times daily., Disp: 180 tablet, Rfl: 2   azelastine (ASTELIN) 0.1 % nasal spray, Place 1 spray into both nostrils  2 (two) times daily., Disp: 90 mL, Rfl: 4   clopidogrel (PLAVIX) 75 MG tablet, Take 1 tablet (75 mg total) by mouth daily., Disp: 90 tablet, Rfl: 2   doxazosin (CARDURA) 8 MG tablet, Take 1 tablet (8 mg total) by mouth at bedtime., Disp: 90 tablet, Rfl: 3   ezetimibe (ZETIA) 10 MG tablet, Take 1 tablet (10 mg total) by mouth daily., Disp: 90 tablet, Rfl: 2   isosorbide mononitrate (IMDUR) 30 MG 24 hr tablet, Take 1 tablet (30 mg total) by mouth daily., Disp: 90 tablet, Rfl: 1   Lactobacillus (PROBIOTIC ACIDOPHILUS) CAPS, Take 1 capsule by mouth daily., Disp: , Rfl:    levothyroxine (SYNTHROID) 50 MCG tablet, Take 1 tablet (50 mcg total) by mouth daily before breakfast., Disp: 90 tablet, Rfl: 3   meclizine (ANTIVERT) 25 MG tablet, Take 25 mg by mouth daily as needed for dizziness., Disp: , Rfl:    mirtazapine (REMERON) 15 MG tablet, Take 1 tablet (15 mg total) by mouth at bedtime. (Patient taking differently: Take 3.5 mg by mouth at bedtime.), Disp: 100 tablet, Rfl: 3   Multiple Vitamin (MULTIVITAMIN) tablet, Take 1 tablet by mouth daily., Disp: , Rfl:    ramipril (ALTACE) 10 MG capsule, TAKE 1 CAPSULE DAILY, Disp: 90 capsule, Rfl:  0   rosuvastatin (CRESTOR) 10 MG tablet, Take 1 tablet (10 mg total) by mouth every other day., Disp: 90 tablet, Rfl: 0   SIMETHICONE PO, Take 1 tablet by mouth as needed (gas)., Disp: , Rfl:    vitamin C (ASCORBIC ACID) 500 MG tablet, Take 500 mg by mouth daily. , Disp: , Rfl:  Social History   Socioeconomic History   Marital status: Widowed    Spouse name: Harriett Sine    Number of children: 1   Years of education: 12   Highest education level: Not on file  Occupational History   Occupation: Retired    Veterinary surgeon: Company secretary x 27 years   Occupation: Retired    Associate Professor: Public librarian    Comment: supervisor x 17 years  Tobacco Use   Smoking status: Former    Current packs/day: 0.00    Average packs/day: 1 pack/day for 18.0 years (18.0 ttl pk-yrs)    Types:  Cigarettes    Start date: 12/17/1948    Quit date: 12/17/1966    Years since quitting: 57.1   Smokeless tobacco: Never  Vaping Use   Vaping status: Never Used  Substance and Sexual Activity   Alcohol use: Yes    Alcohol/week: 1.0 standard drink of alcohol    Types: 1 Standard drinks or equivalent per week    Comment: rare   Drug use: No   Sexual activity: Not Currently  Other Topics Concern   Not on file  Social History Narrative   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[employer]      Right-handed      Caffeine use: none      Son and grandson live with him   Social Drivers of Corporate investment banker Strain: Low Risk  (05/06/2023)   Received from [facility], [facility]   Overall Financial Resource Strain (CARDIA)    Difficulty of Paying Living Expenses: Not very hard  Food Insecurity: No Food Insecurity (01/29/2024)   Hunger Vital Sign    Worried About Running Out of Food in the Last Year: Never true    Ran Out of Food in the Last Year: Never true  Transportation Needs: No Transportation Needs (01/29/2024)   PRAPARE - Administrator, Civil Service (Medical): No    Lack of Transportation (Non-Medical): No  Physical Activity: Sufficiently Active (03/05/2023)   Exercise Vital Sign    Days of Exercise per Week: 5 days    Minutes of Exercise per Session: 60 min  Stress: No Stress Concern Present (03/05/2023)   [employer] of Occupational Health - Occupational Stress Questionnaire    Feeling of Stress : Not at all  Social Connections: Socially Isolated (01/24/2024)   Social Connection and Isolation Panel [NHANES]    Frequency of Communication with Friends and Family: More than three times a week    Frequency of Social Gatherings with Friends and Family: Once a week    Attends Religious Services: Never    Database administrator or Organizations: No    Attends Banker Meetings: Never    Marital Status: Widowed  Intimate  Partner Violence: Not At Risk (01/29/2024)   Humiliation, Afraid, Rape, and Kick questionnaire    Fear of Current or Ex-Partner: No    Emotionally Abused: No    Physically Abused: No    Sexually Abused: No   Family History  Problem Relation Age of Onset   Dementia Mother  Arthritis Mother    Cancer Brother        prostate   Asthma Sister    Heart disease Paternal Aunt    Heart disease Paternal Uncle    Hypertension Maternal Grandmother    CVA Maternal Grandmother    CVA Paternal Grandmother    Hepatitis C Son    Arthritis Son    Colon cancer Neg Hx    Colon polyps Neg Hx    Kidney disease Neg Hx    Esophageal cancer Neg Hx    Gallbladder disease Neg Hx    Diabetes Neg Hx     Objective: Office vital signs reviewed. BP 124/63   Pulse 84   Temp (!) 97.2 F (36.2 C) (Temporal)   Ht 6' (1.829 m)   Wt 175 lb (79.4 kg)   SpO2 96%   BMI 23.73 kg/m   Physical Examination:  General: Awake, alert, nontoxic elderly male, No acute distress HEENT: Sclera white.  Moist mucous membranes Cardio: regular rate and rhythm, S1S2 heard, no murmurs appreciated Pulm: clear to auscultation bilaterally, no wheezes, rhonchi or rales; normal work of breathing on room air Extremities: Left upper extremity with moderate soft tissue swelling, mild warmth but mild/moderate erythema appreciated distally at the wrist site.  He has some bruising noted in the cubital fossa  Assessment/ Plan: 89 y.o. male   NSTEMI (non-ST elevated myocardial infarction) (HCC) - Plan: CMP14+EGFR, CBC  Hospital discharge follow-up  Chronic atrial fibrillation (HCC) - Plan: CMP14+EGFR, CBC  Cellulitis of left arm - Plan: cefTRIAXone (ROCEPHIN) injection 1 g, cephALEXin (KEFLEX) 500 MG capsule, HYDROcodone-acetaminophen (NORCO/VICODIN) 5-325 MG tablet  Left arm pain - Plan: HYDROcodone-acetaminophen (NORCO/VICODIN) 5-325 MG tablet  Recovering from NSTEMI.  Compliant with medications.  Eliquis affordable.  No  complications with Eliquis so far but is having some complications at the site of the catheterization.  I suspect this is likely phlebitis but I am going to cover him for infection given warmth and erythema.  I have given him a dose of Rocephin and he was monitored after the shot since he had a reported penicillin allergy 70 years ago.  I have placed him on Keflex 4 times daily that he will start tomorrow and give him a short course of Norco as well.  I gave him a handout and reviewed the handout with treatment for phlebitis but advised him to avoid NSAIDs given chronic anticoagulation.  National narcotic database reviewed and there were no red flags  Orders Placed This Encounter  Procedures   CMP14+EGFR   CBC   No orders of the defined types were placed in this encounter.   Today's visit is for Transitional Care Management.  The patient was discharged from [facility] on 01/28/2024 with a primary diagnosis of NSTEMI.   Contact with the patient and/or caregiver, by a clinical staff member, was made on 01/29/2024 and was documented as a telephone encounter within the EMR.  Through chart review and discussion with the patient I have determined that management of their condition is of moderate complexity.    Raliegh Ip, DO [facility] [phone]

## 2024-02-03 ENCOUNTER — Ambulatory Visit (HOSPITAL_COMMUNITY)
Admission: RE | Admit: 2024-02-03 | Discharge: 2024-02-03 | Disposition: A | Discharge status: Home / Self Care | Payer: Medicare Other | Source: Ambulatory Visit | Attending: Family Medicine | Admitting: Family Medicine

## 2024-02-03 ENCOUNTER — Ambulatory Visit (INDEPENDENT_AMBULATORY_CARE_PROVIDER_SITE_OTHER): Payer: Medicare Other | Admitting: Family Medicine

## 2024-02-03 ENCOUNTER — Ambulatory Visit (INDEPENDENT_AMBULATORY_CARE_PROVIDER_SITE_OTHER): Payer: Medicare Other

## 2024-02-03 ENCOUNTER — Encounter: Payer: Self-pay | Admitting: Family Medicine

## 2024-02-03 VITALS — BP 126/65 | HR 70 | Temp 96.8°F | Ht 72.0 in | Wt 172.0 lb

## 2024-02-03 DIAGNOSIS — R140 Abdominal distension (gaseous): Secondary | ICD-10-CM | POA: Diagnosis not present

## 2024-02-03 DIAGNOSIS — M25532 Pain in left wrist: Secondary | ICD-10-CM | POA: Insufficient documentation

## 2024-02-03 DIAGNOSIS — M7989 Other specified soft tissue disorders: Secondary | ICD-10-CM

## 2024-02-03 DIAGNOSIS — M79632 Pain in left forearm: Secondary | ICD-10-CM | POA: Diagnosis not present

## 2024-02-03 DIAGNOSIS — L03114 Cellulitis of left upper limb: Secondary | ICD-10-CM

## 2024-02-03 DIAGNOSIS — I808 Phlebitis and thrombophlebitis of other sites: Secondary | ICD-10-CM | POA: Diagnosis not present

## 2024-02-03 MED ORDER — ROSUVASTATIN CALCIUM 10 MG PO TABS: 10.0000 mg | ORAL_TABLET | ORAL | 3 refills | Status: DC

## 2024-02-03 NOTE — Progress Notes (Signed)
Subjective: CC: Recheck arm PCP: Raliegh Ip, DO HPI:Dalton Vaughan is a 89 y.o. male presenting to clinic today for:  1.  Left arm swelling and pain Patient reports swelling is somewhat better but still having some pain in the left upper extremity.  Has pain meds on hand if needed.  He denies any fevers.  He has had some loose stools with the Keflex.  He feels like the wrist feels similar to when he sprained it really badly.  Not sleeping well due to gas and some heartburn.  Feels like the simethicone that he has been using is not really helping resolve the gas buildup.  He is on a daily probiotic.  Not on any OTC acid reflux medications.   ROS: Per HPI  Allergies  Allergen Reactions   Paxil [Paroxetine Hcl] Palpitations   Codeine Other (See Comments)   Eliquis [Apixaban] Other (See Comments)    dizziness   Pravachol [Pravastatin Sodium] Other (See Comments)    myalgias   Zetia [Ezetimibe] Other (See Comments)    myalgias   Penicillins Hives, Rash and Other (See Comments)    Happened in 1959   Vytorin [Ezetimibe-Simvastatin] Other (See Comments)    myalgias   Past Medical History:  Diagnosis Date   Anal fissure    Arthritis    Atrial fibrillation (HCC)    BPH (benign prostatic hypertrophy)    CAD (coronary artery disease)    Dr. Donnie Aho   Cataract    COVID-19    Depression 2004   Diverticulosis    GERD (gastroesophageal reflux disease)    History of TIAs    Hyperlipidemia    Hypertension    Hypertensive heart disease without CHF    Hypothyroidism    Internal hemorrhoids    Lumbar disc disease    Oral bleeding 08/20/2012   Following molar tooth extraction July, 2013   Pneumonia    Ruptured disk 1995   Shingles    Thyroid disease    Vitamin D deficiency    Vitreous detachment     Current Outpatient Medications:    amLODipine (NORVASC) 5 MG tablet, Take 1 tablet (5 mg total) by mouth daily., Disp: 100 tablet, Rfl: 3   apixaban (ELIQUIS) 5 MG TABS  tablet, Take 1 tablet (5 mg total) by mouth 2 (two) times daily., Disp: 180 tablet, Rfl: 2   azelastine (ASTELIN) 0.1 % nasal spray, Place 1 spray into both nostrils 2 (two) times daily., Disp: 90 mL, Rfl: 4   cephALEXin (KEFLEX) 500 MG capsule, Take 1 capsule (500 mg total) by mouth 4 (four) times daily for 7 days. Start Saturday, Disp: 28 capsule, Rfl: 0   clopidogrel (PLAVIX) 75 MG tablet, Take 1 tablet (75 mg total) by mouth daily., Disp: 90 tablet, Rfl: 2   doxazosin (CARDURA) 8 MG tablet, Take 1 tablet (8 mg total) by mouth at bedtime., Disp: 90 tablet, Rfl: 3   ezetimibe (ZETIA) 10 MG tablet, Take 1 tablet (10 mg total) by mouth daily., Disp: 90 tablet, Rfl: 2   HYDROcodone-acetaminophen (NORCO/VICODIN) 5-325 MG tablet, Take 1 tablet by mouth every 8 (eight) hours as needed for severe pain (pain score 7-10)., Disp: 15 tablet, Rfl: 0   isosorbide mononitrate (IMDUR) 30 MG 24 hr tablet, Take 1 tablet (30 mg total) by mouth daily., Disp: 90 tablet, Rfl: 1   Lactobacillus (PROBIOTIC ACIDOPHILUS) CAPS, Take 1 capsule by mouth daily., Disp: , Rfl:    levothyroxine (SYNTHROID) 50 MCG tablet, Take 1  tablet (50 mcg total) by mouth daily before breakfast., Disp: 90 tablet, Rfl: 3   meclizine (ANTIVERT) 25 MG tablet, Take 25 mg by mouth daily as needed for dizziness., Disp: , Rfl:    mirtazapine (REMERON) 15 MG tablet, Take 1 tablet (15 mg total) by mouth at bedtime. (Patient taking differently: Take 3.5 mg by mouth at bedtime.), Disp: 100 tablet, Rfl: 3   Multiple Vitamin (MULTIVITAMIN) tablet, Take 1 tablet by mouth daily., Disp: , Rfl:    ramipril (ALTACE) 10 MG capsule, TAKE 1 CAPSULE DAILY, Disp: 90 capsule, Rfl: 0   rosuvastatin (CRESTOR) 10 MG tablet, Take 1 tablet (10 mg total) by mouth every other day., Disp: 90 tablet, Rfl: 3   SIMETHICONE PO, Take 1 tablet by mouth as needed (gas)., Disp: , Rfl:    vitamin C (ASCORBIC ACID) 500 MG tablet, Take 500 mg by mouth daily. , Disp: , Rfl:  Social  History   Socioeconomic History   Marital status: Widowed    Spouse name: Harriett Sine    Number of children: 1   Years of education: 12   Highest education level: Not on file  Occupational History   Occupation: Retired    Veterinary surgeon: Company secretary x 27 years   Occupation: Retired    Associate Professor: Public librarian    Comment: supervisor x 17 years  Tobacco Use   Smoking status: Former    Current packs/day: 0.00    Average packs/day: 1 pack/day for 18.0 years (18.0 ttl pk-yrs)    Types: Cigarettes    Start date: 12/17/1948    Quit date: 12/17/1966    Years since quitting: 57.1   Smokeless tobacco: Never  Vaping Use   Vaping status: Never Used  Substance and Sexual Activity   Alcohol use: Yes    Alcohol/week: 1.0 standard drink of alcohol    Types: 1 Standard drinks or equivalent per week    Comment: rare   Drug use: No   Sexual activity: Not Currently  Other Topics Concern   Not on file  Social History Narrative   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[employer]      Right-handed      Caffeine use: none      Son and grandson live with him   Social Drivers of Corporate investment banker Strain: Low Risk  (05/06/2023)   Received from [facility], [facility]   Overall Financial Resource Strain (CARDIA)    Difficulty of Paying Living Expenses: Not very hard  Food Insecurity: No Food Insecurity (01/29/2024)   Hunger Vital Sign    Worried About Running Out of Food in the Last Year: Never true    Ran Out of Food in the Last Year: Never true  Transportation Needs: No Transportation Needs (01/29/2024)   PRAPARE - Administrator, Civil Service (Medical): No    Lack of Transportation (Non-Medical): No  Physical Activity: Sufficiently Active (03/05/2023)   Exercise Vital Sign    Days of Exercise per Week: 5 days    Minutes of Exercise per Session: 60 min  Stress: No Stress Concern Present (03/05/2023)   [employer] of Occupational Health - Occupational  Stress Questionnaire    Feeling of Stress : Not at all  Social Connections: Socially Isolated (01/24/2024)   Social Connection and Isolation Panel [NHANES]    Frequency of Communication with Friends and Family: More than three times a week    Frequency of Social Gatherings with  Friends and Family: Once a week    Attends Religious Services: Never    Database administrator or Organizations: No    Attends Banker Meetings: Never    Marital Status: Widowed  Intimate Partner Violence: Not At Risk (01/29/2024)   Humiliation, Afraid, Rape, and Kick questionnaire    Fear of Current or Ex-Partner: No    Emotionally Abused: No    Physically Abused: No    Sexually Abused: No   Family History  Problem Relation Age of Onset   Dementia Mother    Arthritis Mother    Cancer Brother        prostate   Asthma Sister    Heart disease Paternal Aunt    Heart disease Paternal Uncle    Hypertension Maternal Grandmother    CVA Maternal Grandmother    CVA Paternal Grandmother    Hepatitis C Son    Arthritis Son    Colon cancer Neg Hx    Colon polyps Neg Hx    Kidney disease Neg Hx    Esophageal cancer Neg Hx    Gallbladder disease Neg Hx    Diabetes Neg Hx     Objective: Office vital signs reviewed. BP 126/65   Pulse 70   Temp (!) 96.8 F (36 C)   Ht 6' (1.829 m)   Wt 172 lb (78 kg)   SpO2 96%   BMI 23.33 kg/m   Physical Examination:  General: Awake, alert, well nourished, No acute distress Extremities: Persistent moderate soft tissue swelling noted throughout the left wrist and forearm.  Erythema and warmth have gotten better.  Has some residual bruising at the cubital fossa.  Limited active range of motion of the left wrist secondary to pain  No results found.   Assessment/ Plan: 89 y.o. male   Cellulitis of left arm  Thrombophlebitis arm  Wrist pain, acute, left - Plan: DG Wrist Complete Left, US Venous Img Upper Uni Left (DVT)  Left arm swelling - Plan: US  Venous Img Upper Uni Left (DVT)  Abdominal bloating  Plain films were obtained of the left wrist just to ensure that we do not have any occult fractures.  There was nothing on my personal review that seemed abnormal.  Awaiting formal review by radiology.  I still suspect this is a thrombophlebitis but given recent change from Coumadin to Eliquis I just 1 to make sure were not dealing with any other concerning features including blood clot.  Therefore I did go ahead and order venous ultrasound to further evaluate.  He will continue the antibiotics.  Okay to utilize PPI if needed but discussed he will need to time that appropriately with his Eliquis.   Raliegh Ip, DO [facility] [phone]

## 2024-02-04 ENCOUNTER — Other Ambulatory Visit: Payer: Self-pay | Admitting: *Deleted

## 2024-02-04 DIAGNOSIS — I2510 Atherosclerotic heart disease of native coronary artery without angina pectoris: Secondary | ICD-10-CM | POA: Diagnosis not present

## 2024-02-04 DIAGNOSIS — I4891 Unspecified atrial fibrillation: Secondary | ICD-10-CM | POA: Diagnosis not present

## 2024-02-04 DIAGNOSIS — Z951 Presence of aortocoronary bypass graft: Secondary | ICD-10-CM | POA: Diagnosis not present

## 2024-02-04 DIAGNOSIS — Z950 Presence of cardiac pacemaker: Secondary | ICD-10-CM | POA: Diagnosis not present

## 2024-02-04 DIAGNOSIS — I214 Non-ST elevation (NSTEMI) myocardial infarction: Secondary | ICD-10-CM | POA: Diagnosis not present

## 2024-02-04 DIAGNOSIS — Z1330 Encounter for screening examination for mental health and behavioral disorders, unspecified: Secondary | ICD-10-CM | POA: Diagnosis not present

## 2024-02-04 DIAGNOSIS — I495 Sick sinus syndrome: Secondary | ICD-10-CM | POA: Diagnosis not present

## 2024-02-04 MED ORDER — ROSUVASTATIN CALCIUM 10 MG PO TABS: 10.0000 mg | ORAL_TABLET | ORAL | 0 refills | Status: AC

## 2024-02-14 ENCOUNTER — Ambulatory Visit: Payer: Medicare Other | Admitting: Family Medicine

## 2024-02-14 ENCOUNTER — Encounter: Payer: Self-pay | Admitting: Family Medicine

## 2024-02-14 VITALS — BP 128/72 | HR 90 | Temp 98.7°F | Ht 72.0 in | Wt 165.0 lb

## 2024-02-14 DIAGNOSIS — E782 Mixed hyperlipidemia: Secondary | ICD-10-CM | POA: Diagnosis not present

## 2024-02-14 DIAGNOSIS — R140 Abdominal distension (gaseous): Secondary | ICD-10-CM

## 2024-02-14 DIAGNOSIS — I252 Old myocardial infarction: Secondary | ICD-10-CM | POA: Diagnosis not present

## 2024-02-14 DIAGNOSIS — I482 Chronic atrial fibrillation, unspecified: Secondary | ICD-10-CM

## 2024-02-14 DIAGNOSIS — R55 Syncope and collapse: Secondary | ICD-10-CM

## 2024-02-14 DIAGNOSIS — Z7901 Long term (current) use of anticoagulants: Secondary | ICD-10-CM

## 2024-02-14 NOTE — Patient Instructions (Signed)
 Goal LDL is less than 70 given your heart attack We talked about maybe adding Repatha or Nexletol if the LDL doesn't come down below 70 at your follow up visit. Make an appointment with Dr Rhea Belton about this bloating.  Give unrevealing MRI/ EKG, I suspect you are having vasovagal response due to GI issues

## 2024-02-14 NOTE — Progress Notes (Signed)
 Subjective: CC: afib/ arm  PCP: Dalton Ip, DO HPI:Dalton Vaughan is a 89 y.o. male presenting to clinic today for:  1. Afib/dizziness, bloating Transitioned from Coumadin to Eliquis 12/2023 after NSTEMI. He reports no chest pain, shortness of breath.  Left upper extremity swelling has gone away.  Followed up with cardiology recently.  He reports ongoing intermittent dizziness and this seems to be precipitated by gas.  He has been utilizing simethicone with various degrees of relief.  Has not reached out to his gastroenterologist about this.  He has had EKG during episodes and has already had an MRI which demonstrated no significant findings as from a vascular standpoint.  He has a repeat MRI of the brain planned but he is considering canceling it because he does not think it has anything to do with his brain.  2.  Hyperlipidemia associated with NSTEMI He is compliant with cholesterol medications as prescribed and he is really been trying to work on diet.  He wanted to review the cholesterol panel that was obtained in the hospital and discussed what his goals are and when he should have this repeated.  He will be seeing his cardiologist in the spring  ROS: Per HPI  Allergies  Allergen Reactions   Paxil [Paroxetine Hcl] Palpitations   Codeine Other (See Comments)   Eliquis [Apixaban] Other (See Comments)    dizziness   Pravachol [Pravastatin Sodium] Other (See Comments)    myalgias   Zetia [Ezetimibe] Other (See Comments)    myalgias   Penicillins Hives, Rash and Other (See Comments)    Happened in 1959   Vytorin [Ezetimibe-Simvastatin] Other (See Comments)    myalgias   Past Medical History:  Diagnosis Date   Anal fissure    Arthritis    Atrial fibrillation (HCC)    BPH (benign prostatic hypertrophy)    CAD (coronary artery disease)    Dr. Donnie Aho   Cataract    COVID-19    Depression 2004   Diverticulosis    GERD (gastroesophageal reflux disease)    History of TIAs     Hyperlipidemia    Hypertension    Hypertensive heart disease without CHF    Hypothyroidism    Internal hemorrhoids    Lumbar disc disease    NSTEMI (non-ST elevated myocardial infarction) (HCC) 01/22/2024   Oral bleeding 08/20/2012   Following molar tooth extraction July, 2013   Pneumonia    Ruptured disk 1995   Shingles    Thyroid disease    Vitamin D deficiency    Vitreous detachment     Current Outpatient Medications:    amLODipine (NORVASC) 5 MG tablet, Take 1 tablet (5 mg total) by mouth daily., Disp: 100 tablet, Rfl: 3   apixaban (ELIQUIS) 5 MG TABS tablet, Take 1 tablet (5 mg total) by mouth 2 (two) times daily., Disp: 180 tablet, Rfl: 2   azelastine (ASTELIN) 0.1 % nasal spray, Place 1 spray into both nostrils 2 (two) times daily., Disp: 90 mL, Rfl: 4   clopidogrel (PLAVIX) 75 MG tablet, Take 1 tablet (75 mg total) by mouth daily., Disp: 90 tablet, Rfl: 2   doxazosin (CARDURA) 8 MG tablet, Take 1 tablet (8 mg total) by mouth at bedtime., Disp: 90 tablet, Rfl: 3   ezetimibe (ZETIA) 10 MG tablet, Take 1 tablet (10 mg total) by mouth daily., Disp: 90 tablet, Rfl: 2   HYDROcodone-acetaminophen (NORCO/VICODIN) 5-325 MG tablet, Take 1 tablet by mouth every 8 (eight) hours as needed for severe  pain (pain score 7-10)., Disp: 15 tablet, Rfl: 0   isosorbide mononitrate (IMDUR) 30 MG 24 hr tablet, Take 1 tablet (30 mg total) by mouth daily., Disp: 90 tablet, Rfl: 1   Lactobacillus (PROBIOTIC ACIDOPHILUS) CAPS, Take 1 capsule by mouth daily., Disp: , Rfl:    levothyroxine (SYNTHROID) 50 MCG tablet, Take 1 tablet (50 mcg total) by mouth daily before breakfast., Disp: 90 tablet, Rfl: 3   meclizine (ANTIVERT) 25 MG tablet, Take 25 mg by mouth daily as needed for dizziness., Disp: , Rfl:    mirtazapine (REMERON) 15 MG tablet, Take 1 tablet (15 mg total) by mouth at bedtime. (Patient taking differently: Take 3.5 mg by mouth at bedtime.), Disp: 100 tablet, Rfl: 3   Multiple Vitamin  (MULTIVITAMIN) tablet, Take 1 tablet by mouth daily., Disp: , Rfl:    ramipril (ALTACE) 10 MG capsule, TAKE 1 CAPSULE DAILY, Disp: 90 capsule, Rfl: 0   rosuvastatin (CRESTOR) 10 MG tablet, Take 1 tablet (10 mg total) by mouth every other day., Disp: 90 tablet, Rfl: 0   SIMETHICONE PO, Take 1 tablet by mouth as needed (gas)., Disp: , Rfl:    vitamin C (ASCORBIC ACID) 500 MG tablet, Take 500 mg by mouth daily. , Disp: , Rfl:  Social History   Socioeconomic History   Marital status: Widowed    Spouse name: Dalton Vaughan    Number of children: 1   Years of education: 12   Highest education level: Not on file  Occupational History   Occupation: Retired    Veterinary surgeon: Company secretary x 27 years   Occupation: Retired    Associate Professor: Public librarian    Comment: supervisor x 17 years  Tobacco Use   Smoking status: Former    Current packs/day: 0.00    Average packs/day: 1 pack/day for 18.0 years (18.0 ttl pk-yrs)    Types: Cigarettes    Start date: 12/17/1948    Quit date: 12/17/1966    Years since quitting: 57.2   Smokeless tobacco: Never  Vaping Use   Vaping status: Never Used  Substance and Sexual Activity   Alcohol use: Yes    Alcohol/week: 1.0 standard drink of alcohol    Types: 1 Standard drinks or equivalent per week    Comment: rare   Drug use: No   Sexual activity: Not Currently  Other Topics Concern   Not on file  Social History Narrative   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[employer]      Right-handed      Caffeine use: none      Son and grandson live with him   Social Drivers of Corporate investment banker Strain: Low Risk  (02/04/2024)   Received from [facility]   Overall Financial Resource Strain (CARDIA)    Difficulty of Paying Living Expenses: Not hard at all  Food Insecurity: No Food Insecurity (02/04/2024)   Received from [facility]   Hunger Vital Sign    Worried About Running Out of Food in the Last Year: Never true    Ran Out of Food in the Last  Year: Never true  Transportation Needs: No Transportation Needs (02/04/2024)   Received from [facility] - Transportation    Lack of Transportation (Medical): No    Lack of Transportation (Non-Medical): No  Physical Activity: Sufficiently Active (03/05/2023)   Exercise Vital Sign    Days of Exercise per Week: 5 days    Minutes of Exercise per  Session: 60 min  Stress: No Stress Concern Present (03/05/2023)   [employer] of Occupational Health - Occupational Stress Questionnaire    Feeling of Stress : Not at all  Social Connections: Socially Isolated (01/24/2024)   Social Connection and Isolation Panel [NHANES]    Frequency of Communication with Friends and Family: More than three times a week    Frequency of Social Gatherings with Friends and Family: Once a week    Attends Religious Services: Never    Database administrator or Organizations: No    Attends Banker Meetings: Never    Marital Status: Widowed  Intimate Partner Violence: Not At Risk (01/29/2024)   Humiliation, Afraid, Rape, and Kick questionnaire    Fear of Current or Ex-Partner: No    Emotionally Abused: No    Physically Abused: No    Sexually Abused: No   Family History  Problem Relation Age of Onset   Dementia Mother    Arthritis Mother    Cancer Brother        prostate   Asthma Sister    Heart disease Paternal Aunt    Heart disease Paternal Uncle    Hypertension Maternal Grandmother    CVA Maternal Grandmother    CVA Paternal Grandmother    Hepatitis C Son    Arthritis Son    Colon cancer Neg Hx    Colon polyps Neg Hx    Kidney disease Neg Hx    Esophageal cancer Neg Hx    Gallbladder disease Neg Hx    Diabetes Neg Hx     Objective: Office vital signs reviewed. BP 128/72   Pulse 90   Temp 98.7 F (37.1 C)   Ht 6' (1.829 m)   Wt 165 lb (74.8 kg)   SpO2 96%   BMI 22.38 kg/m   Physical Examination:  General: Awake, alert, thin nontoxic male, No acute distress HEENT:  Sclera white. Cardio: Irregularly irregular with rate control., S1S2 heard, murmur present Pulm: clear to auscultation bilaterally, no wheezes, rhonchi or rales; normal work of breathing on room air Extremities: Swelling of left upper extremity resolved Abdomen: Nondistended  Assessment/ Plan: 89 y.o. male   Chronic atrial fibrillation (HCC) - Plan: CMP14+EGFR, CBC, CANCELED: CoaguChek XS/INR Waived  Chronic anticoagulation - Plan: CMP14+EGFR, CBC  Mixed hyperlipidemia - Plan: Lipid panel, CANCELED: Hepatic function panel  History of non-ST elevation myocardial infarction (NSTEMI) - Plan: Lipid panel, CANCELED: Hepatic function panel  Abdominal bloating - Plan: Ambulatory referral to Gastroenterology  Vasovagal response - Plan: Ambulatory referral to Gastroenterology  INR canceled since he is no longer using Coumadin.  Check renal function, CBC.  Will plan to repeat lipid panel with goal LDL less than 70 given history of NSTEMI.  Discussed potential need for addition of Nexletol versus Repatha if he could not get cholesterol down with diet, exercise and current dual therapy with Zetia and statin.  I have referred him to gastroenterology for what sounds like a vasovagal response in the setting of ongoing abdominal bloating that is refractory to use of simethicone.  He has thus far had negative neurologic workup and they could not find any abnormalities on EKG that coincided with dizzy spells in hospital.   Dalton Ip, DO [facility] [phone]

## 2024-02-18 ENCOUNTER — Encounter (HOSPITAL_COMMUNITY): Payer: Self-pay

## 2024-02-18 ENCOUNTER — Encounter (HOSPITAL_COMMUNITY)
Admission: RE | Admit: 2024-02-18 | Discharge: 2024-02-18 | Disposition: A | Discharge status: Home / Self Care | Source: Ambulatory Visit | Attending: Cardiology | Admitting: Cardiology

## 2024-02-18 VITALS — Ht 70.25 in | Wt 165.6 lb

## 2024-02-18 DIAGNOSIS — I214 Non-ST elevation (NSTEMI) myocardial infarction: Secondary | ICD-10-CM | POA: Diagnosis not present

## 2024-02-18 NOTE — Patient Instructions (Signed)
 Patient Instructions  Patient Details  Name: Dalton Vaughan MRN: [id number] Date of Birth: 06/28/1929 Referring Provider:  Almon Hercules, MD  Below are your personal goals for exercise, nutrition, and risk factors. Our goal is to help you stay on track towards obtaining and maintaining these goals. We will be discussing your progress on these goals with you throughout the program.  Initial Exercise Prescription:  Initial Exercise Prescription - 02/18/24 1400       Date of Initial Exercise RX and Referring Provider   Date 02/18/24    Referring Provider Candelaria Stagers MD   Primary Cardiologist: Dr. Norman Herrlich ([place])     Oxygen   Maintain Oxygen Saturation 88% or higher      Treadmill   MPH 1.5    Grade 0.5    Minutes 15    METs 2.25      NuStep   Level 3    SPM 80    Minutes 15    METs 2.2      Prescription Details   Frequency (times per week) 3    Duration Progress to 30 minutes of continuous aerobic without signs/symptoms of physical distress      Intensity   THRR 40-80% of Max Heartrate 96-117    Ratings of Perceived Exertion 11-13    Perceived Dyspnea 0-4      Progression   Progression Continue to progress workloads to maintain intensity without signs/symptoms of physical distress.      Resistance Training   Training Prescription Yes    Weight 3 lb    Reps 10-15             Exercise Goals: Frequency: Be able to perform aerobic exercise two to three times per week in program working toward 2-5 days per week of home exercise.  Intensity: Work with a perceived exertion of 11 (fairly light) - 15 (hard) while following your exercise prescription.  We will make changes to your prescription with you as you progress through the program.   Duration: Be able to do 30 to 45 minutes of continuous aerobic exercise in addition to a 5 minute warm-up and a 5 minute cool-down routine.   Nutrition Goals: Your personal nutrition goals will be established when you do  your nutrition analysis with the dietician.  The following are general nutrition guidelines to follow: Cholesterol < 200mg /day Sodium < 1500mg /day Fiber: Men over 50 yrs - 30 grams per day  Personal Goals:  Personal Goals and Risk Factors at Admission - 02/18/24 1437       Core Components/Risk Factors/Patient Goals on Admission    Weight Management Yes;Weight Maintenance    Intervention Weight Management: Develop a combined nutrition and exercise program designed to reach desired caloric intake, while maintaining appropriate intake of nutrient and fiber, sodium and fats, and appropriate energy expenditure required for the weight goal.;Weight Management: Provide education and appropriate resources to help participant work on and attain dietary goals.    Admit Weight 165 lb 9.6 oz (75.1 kg)    Goal Weight: Short Term 165 lb (74.8 kg)    Goal Weight: Long Term 165 lb (74.8 kg)    Expected Outcomes Short Term: Continue to assess and modify interventions until short term weight is achieved;Long Term: Adherence to nutrition and physical activity/exercise program aimed toward attainment of established weight goal;Weight Maintenance: Understanding of the daily nutrition guidelines, which includes 25-35% calories from fat, 7% or less cal from saturated fats, less than 200mg  cholesterol, less  than 1.5gm of sodium, & 5 or more servings of fruits and vegetables daily    Hypertension Yes    Intervention Provide education on lifestyle modifcations including regular physical activity/exercise, weight management, moderate sodium restriction and increased consumption of fresh fruit, vegetables, and low fat dairy, alcohol moderation, and smoking cessation.;Monitor prescription use compliance.    Expected Outcomes Short Term: Continued assessment and intervention until BP is < 140/7mm HG in hypertensive participants. < 130/78mm HG in hypertensive participants with diabetes, heart failure or chronic kidney  disease.;Long Term: Maintenance of blood pressure at goal levels.    Lipids Yes    Intervention Provide education and support for participant on nutrition & aerobic/resistive exercise along with prescribed medications to achieve LDL 70mg , HDL >40mg .    Expected Outcomes Short Term: Participant states understanding of desired cholesterol values and is compliant with medications prescribed. Participant is following exercise prescription and nutrition guidelines.;Long Term: Cholesterol controlled with medications as prescribed, with individualized exercise RX and with personalized nutrition plan. Value goals: LDL < 70mg , HDL > 40 mg.             Tobacco Use Initial Evaluation: Social History   Tobacco Use  Smoking Status Former   Current packs/day: 0.00   Average packs/day: 1 pack/day for 18.0 years (18.0 ttl pk-yrs)   Types: Cigarettes   Start date: 12/17/1948   Quit date: 12/17/1966   Years since quitting: 57.2  Smokeless Tobacco Never    Exercise Goals and Review:  Exercise Goals     Row Name 02/18/24 1428             Exercise Goals   Increase Physical Activity Yes       Intervention Provide advice, education, support and counseling about physical activity/exercise needs.;Develop an individualized exercise prescription for aerobic and resistive training based on initial evaluation findings, risk stratification, comorbidities and participant's personal goals.       Expected Outcomes Short Term: Attend rehab on a regular basis to increase amount of physical activity.;Long Term: Add in home exercise to make exercise part of routine and to increase amount of physical activity.;Long Term: Exercising regularly at least 3-5 days a week.       Increase Strength and Stamina Yes       Intervention Develop an individualized exercise prescription for aerobic and resistive training based on initial evaluation findings, risk stratification, comorbidities and participant's personal  goals.;Provide advice, education, support and counseling about physical activity/exercise needs.       Expected Outcomes Short Term: Increase workloads from initial exercise prescription for resistance, speed, and METs.;Short Term: Perform resistance training exercises routinely during rehab and add in resistance training at home;Long Term: Improve cardiorespiratory fitness, muscular endurance and strength as measured by increased METs and functional capacity ( )       Able to understand and use rate of perceived exertion (RPE) scale Yes       Intervention Provide education and explanation on how to use RPE scale       Expected Outcomes Short Term: Able to use RPE daily in rehab to express subjective intensity level;Long Term:  Able to use RPE to guide intensity level when exercising independently       Able to understand and use Dyspnea scale Yes       Intervention Provide education and explanation on how to use Dyspnea scale       Expected Outcomes Short Term: Able to use Dyspnea scale daily in rehab to express subjective sense  of shortness of breath during exertion;Long Term: Able to use Dyspnea scale to guide intensity level when exercising independently       Knowledge and understanding of Target Heart Rate Range (THRR) Yes       Intervention Provide education and explanation of THRR including how the numbers were predicted and where they are located for reference       Expected Outcomes Short Term: Able to state/look up THRR;Long Term: Able to use THRR to govern intensity when exercising independently;Short Term: Able to use daily as guideline for intensity in rehab       Able to check pulse independently Yes       Intervention Provide education and demonstration on how to check pulse in carotid and radial arteries.;Review the importance of being able to check your own pulse for safety during independent exercise       Expected Outcomes Short Term: Able to explain why pulse checking is  important during independent exercise;Long Term: Able to check pulse independently and accurately       Understanding of Exercise Prescription Yes       Intervention Provide education, explanation, and written materials on patient's individual exercise prescription       Expected Outcomes Short Term: Able to explain program exercise prescription;Long Term: Able to explain home exercise prescription to exercise independently              Copy of goals given to participant.

## 2024-02-18 NOTE — Progress Notes (Signed)
 Cardiac Individual Treatment Plan  Patient Details  Name: Castiel Lauricella MRN: [id number] Date of Birth: 1929/03/14 Referring Provider:   Flowsheet Row CARDIAC REHAB PHASE II ORIENTATION from 02/18/2024 in [facility] CARDIAC REHABILITATION  Referring Provider Candelaria Stagers MD  [Primary Cardiologist: Dr. Norman Herrlich ([place])]       Initial Encounter Date:  Flowsheet Row CARDIAC REHAB PHASE II ORIENTATION from 02/18/2024 in [place] CARDIAC REHABILITATION  Date 02/18/24       Visit Diagnosis: NSTEMI (non-ST elevated myocardial infarction) [facility])  Patient's Home Medications on Admission:  Current Outpatient Medications:    apixaban (ELIQUIS) 5 MG TABS tablet, Take 1 tablet (5 mg total) by mouth 2 (two) times daily., Disp: 180 tablet, Rfl: 2   clopidogrel (PLAVIX) 75 MG tablet, Take 1 tablet (75 mg total) by mouth daily., Disp: 90 tablet, Rfl: 2   doxazosin (CARDURA) 8 MG tablet, Take 1 tablet (8 mg total) by mouth at bedtime., Disp: 90 tablet, Rfl: 3   ezetimibe (ZETIA) 10 MG tablet, Take 1 tablet (10 mg total) by mouth daily., Disp: 90 tablet, Rfl: 2   isosorbide mononitrate (IMDUR) 30 MG 24 hr tablet, Take 1 tablet (30 mg total) by mouth daily., Disp: 90 tablet, Rfl: 1   Lactobacillus (PROBIOTIC ACIDOPHILUS) CAPS, Take 1 capsule by mouth daily., Disp: , Rfl:    levothyroxine (SYNTHROID) 50 MCG tablet, Take 1 tablet (50 mcg total) by mouth daily before breakfast., Disp: 90 tablet, Rfl: 3   mirtazapine (REMERON) 15 MG tablet, Take 1 tablet (15 mg total) by mouth at bedtime., Disp: 100 tablet, Rfl: 3   Multiple Vitamin (MULTIVITAMIN) tablet, Take 1 tablet by mouth daily., Disp: , Rfl:    ramipril (ALTACE) 10 MG capsule, TAKE 1 CAPSULE DAILY, Disp: 90 capsule, Rfl: 0   rosuvastatin (CRESTOR) 10 MG tablet, Take 1 tablet (10 mg total) by mouth every other day., Disp: 90 tablet, Rfl: 0   SIMETHICONE PO, Take 1 tablet by mouth as needed (gas)., Disp: , Rfl:    vitamin C (ASCORBIC ACID) 500 MG  tablet, Take 500 mg by mouth daily. , Disp: , Rfl:    amLODipine (NORVASC) 5 MG tablet, Take 1 tablet (5 mg total) by mouth daily. (Patient not taking: Reported on 02/18/2024), Disp: 100 tablet, Rfl: 3   azelastine (ASTELIN) 0.1 % nasal spray, Place 1 spray into both nostrils 2 (two) times daily. (Patient not taking: Reported on 02/18/2024), Disp: 90 mL, Rfl: 4   HYDROcodone-acetaminophen (NORCO/VICODIN) 5-325 MG tablet, Take 1 tablet by mouth every 8 (eight) hours as needed for severe pain (pain score 7-10). (Patient not taking: Reported on 02/18/2024), Disp: 15 tablet, Rfl: 0   meclizine (ANTIVERT) 25 MG tablet, Take 25 mg by mouth daily as needed for dizziness. (Patient not taking: Reported on 02/18/2024), Disp: , Rfl:   Past Medical History: Past Medical History:  Diagnosis Date   Anal fissure    Arthritis    Atrial fibrillation (HCC)    BPH (benign prostatic hypertrophy)    CAD (coronary artery disease)    Dr. Donnie Aho   Cataract    COVID-19    Depression 2004   Diverticulosis    GERD (gastroesophageal reflux disease)    History of TIAs    Hyperlipidemia    Hypertension    Hypertensive heart disease without CHF    Hypothyroidism    Internal hemorrhoids    Lumbar disc disease    NSTEMI (non-ST elevated myocardial infarction) (HCC) 01/22/2024   Oral  bleeding 08/20/2012   Following molar tooth extraction July, 2013   Pneumonia    Ruptured disk 1995   Shingles    Thyroid disease    Vitamin D deficiency    Vitreous detachment     Tobacco Use: Social History   Tobacco Use  Smoking Status Former   Current packs/day: 0.00   Average packs/day: 1 pack/day for 18.0 years (18.0 ttl pk-yrs)   Types: Cigarettes   Start date: 12/17/1948   Quit date: 12/17/1966   Years since quitting: 57.2  Smokeless Tobacco Never    Labs: Review Flowsheet  More data exists      Latest Ref Rng & Units 02/05/2019 02/22/2021 12/20/2021 01/01/2023 01/24/2024  Labs for ITP Cardiac and Pulmonary Rehab  Cholestrol  0 - 200 mg/dL 161  096  045  409  811   LDL (calc) 0 - 99 mg/dL 914  782  956  213  086   HDL-C >40 mg/dL 44  54  53  51  43   Trlycerides <150 mg/dL 68  74  78  82  32   Hemoglobin A1c 4.8 - 5.6 % 5.5  - - - -    Capillary Blood Glucose: No results found for: "GLUCAP"   Exercise Target Goals: Exercise Program Goal: Individual exercise prescription set using results from initial 6 min walk test and THRR while considering  patient's activity barriers and safety.   Exercise Prescription Goal: Starting with aerobic activity 30 plus minutes a day, 3 days per week for initial exercise prescription. Provide home exercise prescription and guidelines that participant acknowledges understanding prior to discharge.  Activity Barriers & Risk Stratification:  Activity Barriers & Cardiac Risk Stratification - 02/18/24 1327       Activity Barriers & Cardiac Risk Stratification   Activity Barriers Balance Concerns;Assistive Device;Deconditioning;Muscular Weakness;Arthritis;Shortness of Breath;Neck/Spine Problems    Cardiac Risk Stratification Moderate             6 Minute Walk:  6 Minute Walk     Row Name 02/18/24 1425         6 Minute Walk   Phase Initial     Distance 1070 feet     Walk Time 6 minutes     # of Rest Breaks 0     MPH 2.07     METS 1.78     RPE 12     VO2 Peak 6.25     Symptoms No     Resting HR 75 bpm     Resting BP 128/72     Resting Oxygen Saturation  97 %     Exercise Oxygen Saturation  during 6 min walk 98 %     Max Ex. HR 108 bpm     Max Ex. BP 144/74     2 Minute Post BP 124/62              Oxygen Initial Assessment:   Oxygen Re-Evaluation:   Oxygen Discharge (Final Oxygen Re-Evaluation):   Initial Exercise Prescription:  Initial Exercise Prescription - 02/18/24 1400       Date of Initial Exercise RX and Referring Provider   Date 02/18/24    Referring Provider Candelaria Stagers MD   Primary Cardiologist: Dr. Norman Herrlich ([place])      Oxygen   Maintain Oxygen Saturation 88% or higher      Treadmill   MPH 1.5    Grade 0.5    Minutes 15    METs 2.25  NuStep   Level 3    SPM 80    Minutes 15    METs 2.2      Prescription Details   Frequency (times per week) 3    Duration Progress to 30 minutes of continuous aerobic without signs/symptoms of physical distress      Intensity   THRR 40-80% of Max Heartrate 96-117    Ratings of Perceived Exertion 11-13    Perceived Dyspnea 0-4      Progression   Progression Continue to progress workloads to maintain intensity without signs/symptoms of physical distress.      Resistance Training   Training Prescription Yes    Weight 3 lb    Reps 10-15             Perform Capillary Blood Glucose checks as needed.  Exercise Prescription Changes:   Exercise Prescription Changes     Row Name 02/18/24 1400             Response to Exercise   Blood Pressure (Admit) 128/72       Blood Pressure (Exercise) 144/74       Blood Pressure (Exit) 124/62       Heart Rate (Admit) 75 bpm       Heart Rate (Exercise) 108 bpm       Heart Rate (Exit) 78 bpm       Oxygen Saturation (Admit) 97 %       Oxygen Saturation (Exercise) 98 %       Rating of Perceived Exertion (Exercise) 12       Symptoms none       Comments walk test results                Exercise Comments:   Exercise Goals and Review:   Exercise Goals     Row Name 02/18/24 1428             Exercise Goals   Increase Physical Activity Yes       Intervention Provide advice, education, support and counseling about physical activity/exercise needs.;Develop an individualized exercise prescription for aerobic and resistive training based on initial evaluation findings, risk stratification, comorbidities and participant's personal goals.       Expected Outcomes Short Term: Attend rehab on a regular basis to increase amount of physical activity.;Long Term: Add in home exercise to make exercise part of  routine and to increase amount of physical activity.;Long Term: Exercising regularly at least 3-5 days a week.       Increase Strength and Stamina Yes       Intervention Develop an individualized exercise prescription for aerobic and resistive training based on initial evaluation findings, risk stratification, comorbidities and participant's personal goals.;Provide advice, education, support and counseling about physical activity/exercise needs.       Expected Outcomes Short Term: Increase workloads from initial exercise prescription for resistance, speed, and METs.;Short Term: Perform resistance training exercises routinely during rehab and add in resistance training at home;Long Term: Improve cardiorespiratory fitness, muscular endurance and strength as measured by increased METs and functional capacity ( )       Able to understand and use rate of perceived exertion (RPE) scale Yes       Intervention Provide education and explanation on how to use RPE scale       Expected Outcomes Short Term: Able to use RPE daily in rehab to express subjective intensity level;Long Term:  Able to use RPE to guide intensity level when exercising independently  Able to understand and use Dyspnea scale Yes       Intervention Provide education and explanation on how to use Dyspnea scale       Expected Outcomes Short Term: Able to use Dyspnea scale daily in rehab to express subjective sense of shortness of breath during exertion;Long Term: Able to use Dyspnea scale to guide intensity level when exercising independently       Knowledge and understanding of Target Heart Rate Range (THRR) Yes       Intervention Provide education and explanation of THRR including how the numbers were predicted and where they are located for reference       Expected Outcomes Short Term: Able to state/look up THRR;Long Term: Able to use THRR to govern intensity when exercising independently;Short Term: Able to use daily as guideline for  intensity in rehab       Able to check pulse independently Yes       Intervention Provide education and demonstration on how to check pulse in carotid and radial arteries.;Review the importance of being able to check your own pulse for safety during independent exercise       Expected Outcomes Short Term: Able to explain why pulse checking is important during independent exercise;Long Term: Able to check pulse independently and accurately       Understanding of Exercise Prescription Yes       Intervention Provide education, explanation, and written materials on patient's individual exercise prescription       Expected Outcomes Short Term: Able to explain program exercise prescription;Long Term: Able to explain home exercise prescription to exercise independently                Exercise Goals Re-Evaluation :    Discharge Exercise Prescription (Final Exercise Prescription Changes):  Exercise Prescription Changes - 02/18/24 1400       Response to Exercise   Blood Pressure (Admit) 128/72    Blood Pressure (Exercise) 144/74    Blood Pressure (Exit) 124/62    Heart Rate (Admit) 75 bpm    Heart Rate (Exercise) 108 bpm    Heart Rate (Exit) 78 bpm    Oxygen Saturation (Admit) 97 %    Oxygen Saturation (Exercise) 98 %    Rating of Perceived Exertion (Exercise) 12    Symptoms none    Comments walk test results             Nutrition:  Target Goals: Understanding of nutrition guidelines, daily intake of sodium 1500mg , cholesterol 200mg , calories 30% from fat and 7% or less from saturated fats, daily to have 5 or more servings of fruits and vegetables.  Biometrics:  Pre Biometrics - 02/18/24 1428       Pre Biometrics   Height 5' 10.25" (1.784 m)    Weight 75.1 kg    Waist Circumference 34.5 inches    Hip Circumference 38 inches    Waist to Hip Ratio 0.91 %    BMI (Calculated) 23.6    Grip Strength 26.9 kg    Single Leg Stand 2.6 seconds              Nutrition  Therapy Plan and Nutrition Goals:  Nutrition Therapy & Goals - 02/18/24 1325       Intervention Plan   Intervention Prescribe, educate and counsel regarding individualized specific dietary modifications aiming towards targeted core components such as weight, hypertension, lipid management, diabetes, heart failure and other comorbidities.;Nutrition handout(s) given to patient.    Expected Outcomes  Short Term Goal: Understand basic principles of dietary content, such as calories, fat, sodium, cholesterol and nutrients.;Long Term Goal: Adherence to prescribed nutrition plan.             Nutrition Assessments:  MEDIFICTS Score Key: >=70 Need to make dietary changes  40-70 Heart Healthy Diet <= 40 Therapeutic Level Cholesterol Diet  Flowsheet Row CARDIAC REHAB PHASE II ORIENTATION from 02/18/2024 in [facility] CARDIAC REHABILITATION  Picture Your Plate Total Score on Admission 73      Picture Your Plate Scores: <13 Unhealthy dietary pattern with much room for improvement. 41-50 Dietary pattern unlikely to meet recommendations for good health and room for improvement. 51-60 More healthful dietary pattern, with some room for improvement.  >60 Healthy dietary pattern, although there may be some specific behaviors that could be improved.    Nutrition Goals Re-Evaluation:   Nutrition Goals Discharge (Final Nutrition Goals Re-Evaluation):   Psychosocial: Target Goals: Acknowledge presence or absence of significant depression and/or stress, maximize coping skills, provide positive support system. Participant is able to verbalize types and ability to use techniques and skills needed for reducing stress and depression.  Initial Review & Psychosocial Screening:  Initial Psych Review & Screening - 02/18/24 1309       Initial Review   Current issues with Current Anxiety/Panic;Current Sleep Concerns;Current Stress Concerns    Source of Stress Concerns Financial;Chronic Illness;Family     Comments Worried about new onset of gas feeling/ financial issues with medical bills. Lives with son and grandson, loss wife 4 years ago      Family Dynamics   Good Support System? Yes   Son and grandon   Comments Son and grandson live with the patient, wife passed away about 4 years ago.      Barriers   Psychosocial barriers to participate in program The patient should benefit from training in stress management and relaxation.;Psychosocial barriers identified (see note)      Screening Interventions   Interventions Encouraged to exercise;To provide support and resources with identified psychosocial needs;Provide feedback about the scores to participant    Expected Outcomes Short Term goal: Utilizing psychosocial counselor, staff and physician to assist with identification of specific Stressors or current issues interfering with healing process. Setting desired goal for each stressor or current issue identified.;Long Term Goal: Stressors or current issues are controlled or eliminated.;Short Term goal: Identification and review with participant of any Quality of Life or Depression concerns found by scoring the questionnaire.;Long Term goal: The participant improves quality of Life and PHQ9 Scores as seen by post scores and/or verbalization of changes             Quality of Life Scores:  Quality of Life - 02/18/24 1312       Quality of Life   Select Quality of Life      Quality of Life Scores   Health/Function Pre 23.29 %    Socioeconomic Pre 30 %    Psych/Spiritual Pre 27 %    Family Pre 25.5 %    GLOBAL Pre 25.67 %            Scores of 19 and below usually indicate a poorer quality of life in these areas.  A difference of  2-3 points is a clinically meaningful difference.  A difference of 2-3 points in the total score of the Quality of Life Index has been associated with significant improvement in overall quality of life, self-image, physical symptoms, and general health in  studies  assessing change in quality of life.  PHQ-9: Review Flowsheet  More data exists      02/18/2024 02/14/2024 02/03/2024 01/31/2024 01/07/2024  Depression screen PHQ 2/9  Decreased Interest 0 0 0 0 0  Down, Depressed, Hopeless 0 0 0 0 0  PHQ - 2 Score 0 0 0 0 0  Altered sleeping 1 0 0 0 0  Tired, decreased energy 1 0 0 0 0  Change in appetite 0 0 0 0 0  Feeling bad or failure about yourself  1 0 0 0 0  Trouble concentrating 0 0 0 0 0  Moving slowly or fidgety/restless 0 0 0 0 0  Suicidal thoughts 0 0 0 0 0  PHQ-9 Score 3 0 0 0 0  Difficult doing work/chores Not difficult at all Not difficult at all Not difficult at all Not difficult at all Not difficult at all   Interpretation of Total Score  Total Score Depression Severity:  1-4 = Minimal depression, 5-9 = Mild depression, 10-14 = Moderate depression, 15-19 = Moderately severe depression, 20-27 = Severe depression   Psychosocial Evaluation and Intervention:  Psychosocial Evaluation - 02/18/24 1430       Psychosocial Evaluation & Interventions   Interventions Stress management education;Relaxation education;Encouraged to exercise with the program and follow exercise prescription    Comments Waqas is coming into cardiac rehab after a NSTEMI.  He had CABG and pacemaker placed in 1999.   Now his grafts are starting to occlude.  They decided to treat medically and he has not have any chest pain.  However, he has noted a "gassy" feeling that makes him feel dizzy.  He has mentioned it to doctor and they are watching it. He wants to know that his heart is okay and he can go and do as he wants like he did since his CABG.  He lives with his son and grandson.  His wife passed 4 years ago and he still struggles with that from time to time. He is worried about finances with all his recent medical bills and has not been sleeping as well since his event.  He does not perceive any barriers to attending rehab and opted for 3 days a week to finish program  sooner.    Expected Outcomes Short: Attend rehab to improve stamina and confidence in heart Long: Continue to build his confidence back up    Continue Psychosocial Services  Follow up required by staff             Psychosocial Re-Evaluation:   Psychosocial Discharge (Final Psychosocial Re-Evaluation):   Vocational Rehabilitation: Provide vocational rehab assistance to qualifying candidates.   Vocational Rehab Evaluation & Intervention:  Vocational Rehab - 02/18/24 1437       Initial Vocational Rehab Evaluation & Intervention   Assessment shows need for Vocational Rehabilitation No   retired            Education: Education Goals: Education classes will be provided on a weekly basis, covering required topics. Participant will state understanding/return demonstration of topics presented.  Learning Barriers/Preferences:  Learning Barriers/Preferences - 02/18/24 1312       Learning Barriers/Preferences   Learning Barriers Sight   Wears glasses   Learning Preferences Audio;Group Instruction;Individual Instruction;Pictoral;Video;Verbal Instruction;Written Material             Education Topics: Hypertension, Hypertension Reduction -Define heart disease and high blood pressure. Discus how high blood pressure affects the body and ways to reduce high blood pressure.  Exercise and Your Heart -Discuss why it is important to exercise, the FITT principles of exercise, normal and abnormal responses to exercise, and how to exercise safely.   Angina -Discuss definition of angina, causes of angina, treatment of angina, and how to decrease risk of having angina.   Cardiac Medications -Review what the following cardiac medications are used for, how they affect the body, and side effects that may occur when taking the medications.  Medications include Aspirin, Beta blockers, calcium channel blockers, ACE Inhibitors, angiotensin receptor blockers, diuretics, digoxin, and  antihyperlipidemics.   Congestive Heart Failure -Discuss the definition of CHF, how to live with CHF, the signs and symptoms of CHF, and how keep track of weight and sodium intake.   Heart Disease and Intimacy -Discus the effect sexual activity has on the heart, how changes occur during intimacy as we age, and safety during sexual activity.   Smoking Cessation / COPD -Discuss different methods to quit smoking, the health benefits of quitting smoking, and the definition of COPD.   Nutrition I: Fats -Discuss the types of cholesterol, what cholesterol does to the heart, and how cholesterol levels can be controlled.   Nutrition II: Labels -Discuss the different components of food labels and how to read food label   Heart Parts/Heart Disease and PAD -Discuss the anatomy of the heart, the pathway of blood circulation through the heart, and these are affected by heart disease.   Stress I: Signs and Symptoms -Discuss the causes of stress, how stress may lead to anxiety and depression, and ways to limit stress.   Stress II: Relaxation -Discuss different types of relaxation techniques to limit stress.   Warning Signs of Stroke / TIA -Discuss definition of a stroke, what the signs and symptoms are of a stroke, and how to identify when someone is having stroke.   Knowledge Questionnaire Score:  Knowledge Questionnaire Score - 02/18/24 1436       Knowledge Questionnaire Score   Pre Score 23/24             Core Components/Risk Factors/Patient Goals at Admission:  Personal Goals and Risk Factors at Admission - 02/18/24 1437       Core Components/Risk Factors/Patient Goals on Admission    Weight Management Yes;Weight Maintenance    Intervention Weight Management: Develop a combined nutrition and exercise program designed to reach desired caloric intake, while maintaining appropriate intake of nutrient and fiber, sodium and fats, and appropriate energy expenditure required  for the weight goal.;Weight Management: Provide education and appropriate resources to help participant work on and attain dietary goals.    Admit Weight 165 lb 9.6 oz (75.1 kg)    Goal Weight: Short Term 165 lb (74.8 kg)    Goal Weight: Long Term 165 lb (74.8 kg)    Expected Outcomes Short Term: Continue to assess and modify interventions until short term weight is achieved;Long Term: Adherence to nutrition and physical activity/exercise program aimed toward attainment of established weight goal;Weight Maintenance: Understanding of the daily nutrition guidelines, which includes 25-35% calories from fat, 7% or less cal from saturated fats, less than 200mg  cholesterol, less than 1.5gm of sodium, & 5 or more servings of fruits and vegetables daily    Hypertension Yes    Intervention Provide education on lifestyle modifcations including regular physical activity/exercise, weight management, moderate sodium restriction and increased consumption of fresh fruit, vegetables, and low fat dairy, alcohol moderation, and smoking cessation.;Monitor prescription use compliance.    Expected Outcomes Short Term: Continued  assessment and intervention until BP is < 140/90mm HG in hypertensive participants. < 130/55mm HG in hypertensive participants with diabetes, heart failure or chronic kidney disease.;Long Term: Maintenance of blood pressure at goal levels.    Lipids Yes    Intervention Provide education and support for participant on nutrition & aerobic/resistive exercise along with prescribed medications to achieve LDL 70mg , HDL >40mg .    Expected Outcomes Short Term: Participant states understanding of desired cholesterol values and is compliant with medications prescribed. Participant is following exercise prescription and nutrition guidelines.;Long Term: Cholesterol controlled with medications as prescribed, with individualized exercise RX and with personalized nutrition plan. Value goals: LDL < 70mg , HDL > 40 mg.              Core Components/Risk Factors/Patient Goals Review:    Core Components/Risk Factors/Patient Goals at Discharge (Final Review):    ITP Comments:  ITP Comments     Row Name 02/18/24 1424           ITP Comments Patient attend orientation today.  Patient is attending Cardiac Rehabilitation Program.  Documentation for diagnosis [facility] be found in [facility] 01/22/24.  Reviewed medical chart, RPE/RPD, gym safety, and program guidelines.  Patient was fitted to equipment they will be using during rehab.  Patient is scheduled to start exercise on Wednesday 02/20/24 at 1100.   Initial ITP created and sent for review and signature by Dr. Dina Rich, Medical Director for Cardiac Rehabilitation Program.                Comments: Initial ITP

## 2024-02-19 ENCOUNTER — Encounter (HOSPITAL_COMMUNITY)
Admission: RE | Admit: 2024-02-19 | Discharge: 2024-02-19 | Disposition: A | Discharge status: Home / Self Care | Source: Ambulatory Visit | Attending: Cardiology | Admitting: Cardiology

## 2024-02-19 DIAGNOSIS — I214 Non-ST elevation (NSTEMI) myocardial infarction: Secondary | ICD-10-CM

## 2024-02-19 NOTE — Progress Notes (Signed)
 Daily Session Note  Patient Details  Name: Dalton Vaughan MRN: [id number] Date of Birth: November 09, 1929 Referring Provider:   Flowsheet Row CARDIAC REHAB PHASE II ORIENTATION from 02/18/2024 in [facility] CARDIAC REHABILITATION  Referring Provider Candelaria Stagers MD  [Primary Cardiologist: Dr. Norman Herrlich ([place])]       Encounter Date: 02/19/2024  Check In:  Session Check In - 02/19/24 1026       Check-In   Supervising physician immediately available to respond to emergencies See telemetry face sheet for immediately available MD    Location [facility]    Staff Present Fabio Pierce, MA, RCEP, CCRP, CCET;Jaelan Rasheed Gaynell Face, Exercise Physiologist;Hillary Leonidas Romberg BSN, RN    Virtual Visit No    Medication changes reported     No    Fall or balance concerns reported    No    Tobacco Cessation No Change    Warm-up and Cool-down Performed on first and last piece of equipment    Resistance Training Performed Yes    VAD Patient? No    PAD/SET Patient? No      Pain Assessment   Currently in Pain? No/denies    Pain Score 0-No pain    Multiple Pain Sites No             Capillary Blood Glucose: No results found for this or any previous visit (from the past 24 hours).    Social History   Tobacco Use  Smoking Status Former   Current packs/day: 0.00   Average packs/day: 1 pack/day for 18.0 years (18.0 ttl pk-yrs)   Types: Cigarettes   Start date: 12/17/1948   Quit date: 12/17/1966   Years since quitting: 57.2  Smokeless Tobacco Never    Goals Met:  Independence with exercise equipment Exercise tolerated well No report of concerns or symptoms today Strength training completed today  Goals Unmet:  Not Applicable  Comments: First full day of exercise!  Patient was oriented to gym and equipment including functions, settings, policies, and procedures.  Patient's individual exercise prescription and treatment plan were reviewed.  All starting workloads were  established based on the results of the 6 minute walk test done at initial orientation visit.  The plan for exercise progression was also introduced and progression will be customized based on patient's performance and goals.

## 2024-02-21 ENCOUNTER — Encounter (HOSPITAL_COMMUNITY)
Admission: RE | Admit: 2024-02-21 | Discharge: 2024-02-21 | Disposition: A | Discharge status: Home / Self Care | Source: Ambulatory Visit | Attending: Cardiology | Admitting: Cardiology

## 2024-02-21 DIAGNOSIS — I214 Non-ST elevation (NSTEMI) myocardial infarction: Secondary | ICD-10-CM | POA: Diagnosis not present

## 2024-02-21 NOTE — Progress Notes (Signed)
 Daily Session Note  Patient Details  Name: Jasraj Lappe MRN: [id number] Date of Birth: May 07, 1929 Referring Provider:   Flowsheet Row CARDIAC REHAB PHASE II ORIENTATION from 02/18/2024 in [facility] CARDIAC REHABILITATION  Referring Provider Candelaria Stagers MD  [Primary Cardiologist: Dr. Norman Herrlich ([place])]       Encounter Date: 02/21/2024  Check In:  Session Check In - 02/21/24 1100       Check-In   Supervising physician immediately available to respond to emergencies See telemetry face sheet for immediately available MD    Location [facility]    Staff Present Ross Ludwig, BS, Exercise Physiologist;Jessica [place], MA, RCEP, CCRP, Dow Adolph, RN, BSN    Virtual Visit No    Medication changes reported     No    Fall or balance concerns reported    No    Tobacco Cessation No Change    Warm-up and Cool-down Performed on first and last piece of equipment    Resistance Training Performed Yes    VAD Patient? No    PAD/SET Patient? No      Pain Assessment   Currently in Pain? No/denies    Pain Score 0-No pain    Multiple Pain Sites No             Capillary Blood Glucose: No results found for this or any previous visit (from the past 24 hours).    Social History   Tobacco Use  Smoking Status Former   Current packs/day: 0.00   Average packs/day: 1 pack/day for 18.0 years (18.0 ttl pk-yrs)   Types: Cigarettes   Start date: 12/17/1948   Quit date: 12/17/1966   Years since quitting: 57.2  Smokeless Tobacco Never    Goals Met:  Independence with exercise equipment Exercise tolerated well No report of concerns or symptoms today Strength training completed today  Goals Unmet:  Not Applicable  Comments: Pt able to follow exercise prescription today without complaint.  Will continue to monitor for progression.

## 2024-02-24 ENCOUNTER — Other Ambulatory Visit: Payer: Self-pay | Admitting: *Deleted

## 2024-02-24 ENCOUNTER — Encounter (HOSPITAL_COMMUNITY)
Admission: RE | Admit: 2024-02-24 | Discharge: 2024-02-24 | Disposition: A | Discharge status: Home / Self Care | Source: Ambulatory Visit | Attending: Cardiology | Admitting: Cardiology

## 2024-02-24 DIAGNOSIS — I214 Non-ST elevation (NSTEMI) myocardial infarction: Secondary | ICD-10-CM

## 2024-02-24 MED ORDER — APIXABAN 5 MG PO TABS: 5.0000 mg | ORAL_TABLET | Freq: Two times a day (BID) | ORAL | 1 refills | Status: DC

## 2024-02-24 NOTE — Progress Notes (Signed)
 Daily Session Note  Patient Details  Name: Dalton Vaughan MRN: [id number] Date of Birth: September 16, 1929 Referring Provider:   Flowsheet Row CARDIAC REHAB PHASE II ORIENTATION from 02/18/2024 in [facility] CARDIAC REHABILITATION  Referring Provider Candelaria Stagers MD  [Primary Cardiologist: Dr. Norman Herrlich ([place])]       Encounter Date: 02/24/2024  Check In:  Session Check In - 02/24/24 1100       Check-In   Supervising physician immediately available to respond to emergencies See telemetry face sheet for immediately available MD    Location [facility]    Staff Present Fabio Pierce, MA, RCEP, CCRP, CCET;Kitty Cadavid Fredric Mare, BS, Exercise Physiologist;Brittany Roseanne Reno, BSN, RN, WTA-C    Virtual Visit No    Medication changes reported     No    Fall or balance concerns reported    No    Tobacco Cessation No Change    Warm-up and Cool-down Performed on first and last piece of equipment    Resistance Training Performed Yes    VAD Patient? No    PAD/SET Patient? No      Pain Assessment   Currently in Pain? No/denies    Pain Score 0-No pain    Multiple Pain Sites No             Capillary Blood Glucose: No results found for this or any previous visit (from the past 24 hours).    Social History   Tobacco Use  Smoking Status Former   Current packs/day: 0.00   Average packs/day: 1 pack/day for 18.0 years (18.0 ttl pk-yrs)   Types: Cigarettes   Start date: 12/17/1948   Quit date: 12/17/1966   Years since quitting: 57.2  Smokeless Tobacco Never    Goals Met:  Independence with exercise equipment Exercise tolerated well No report of concerns or symptoms today Strength training completed today  Goals Unmet:  O2 Sat  Comments: Pt able to follow exercise prescription today without complaint.  Will continue to monitor for progression.

## 2024-02-26 ENCOUNTER — Encounter (HOSPITAL_COMMUNITY)
Admission: RE | Admit: 2024-02-26 | Discharge: 2024-02-26 | Disposition: A | Discharge status: Home / Self Care | Source: Ambulatory Visit | Attending: Cardiology | Admitting: Cardiology

## 2024-02-26 ENCOUNTER — Ambulatory Visit (HOSPITAL_COMMUNITY): Payer: TRICARE For Life (TFL)

## 2024-02-26 DIAGNOSIS — I214 Non-ST elevation (NSTEMI) myocardial infarction: Secondary | ICD-10-CM

## 2024-02-26 NOTE — Progress Notes (Signed)
 Daily Session Note  Patient Details  Name: Dalton Vaughan MRN: [id number] Date of Birth: [birth year] Referring Provider:   Flowsheet Row CARDIAC REHAB PHASE II ORIENTATION from 02/18/2024 in [facility] CARDIAC REHABILITATION  Referring Provider Candelaria Stagers MD  [Primary Cardiologist: Dr. Norman Herrlich ([place])]       Encounter Date: 02/26/2024  Check In:  Session Check In - 02/26/24 1025       Check-In   Supervising physician immediately available to respond to emergencies See telemetry face sheet for immediately available MD    Location [facility]    Staff Present Avanell Shackleton BSN, RN;Heather Fredric Mare, BS, Exercise Physiologist;Jessica [place], MA, RCEP, CCRP, CCET    Virtual Visit No    Medication changes reported     No    Fall or balance concerns reported    No    Tobacco Cessation No Change    Warm-up and Cool-down Performed on first and last piece of equipment    Resistance Training Performed Yes    VAD Patient? No    PAD/SET Patient? No      Pain Assessment   Currently in Pain? No/denies    Pain Score 0-No pain    Multiple Pain Sites No             Capillary Blood Glucose: No results found for this or any previous visit (from the past 24 hours).    Social History   Tobacco Use  Smoking Status Former   Current packs/day: 0.00   Average packs/day: 1 pack/day for 18.0 years (18.0 ttl pk-yrs)   Types: Cigarettes   Start date: 12/17/1948   Quit date: 12/17/1966   Years since quitting: 57.2  Smokeless Tobacco Never    Goals Met:  Independence with exercise equipment Exercise tolerated well No report of concerns or symptoms today Strength training completed today  Goals Unmet:  Not Applicable  Comments: Marland KitchenMarland KitchenPt able to follow exercise prescription today without complaint.  Will continue to monitor for progression.

## 2024-02-28 ENCOUNTER — Encounter (HOSPITAL_COMMUNITY)
Admission: RE | Admit: 2024-02-28 | Discharge: 2024-02-28 | Disposition: A | Discharge status: Home / Self Care | Source: Ambulatory Visit | Attending: Cardiology | Admitting: Cardiology

## 2024-02-28 DIAGNOSIS — I214 Non-ST elevation (NSTEMI) myocardial infarction: Secondary | ICD-10-CM | POA: Diagnosis not present

## 2024-02-28 NOTE — Progress Notes (Signed)
 Daily Session Note  Patient Details  Name: Shelton Square MRN: [id number] Date of Birth: Sep 22, 1929 Referring Provider:   Flowsheet Row CARDIAC REHAB PHASE II ORIENTATION from 02/18/2024 in [facility] CARDIAC REHABILITATION  Referring Provider Candelaria Stagers MD  [Primary Cardiologist: Dr. Norman Herrlich ([place])]       Encounter Date: 02/28/2024  Check In:  Session Check In - 02/28/24 1112       Check-In   Supervising physician immediately available to respond to emergencies See telemetry face sheet for immediately available MD    Location [facility]    Staff Present Fabio Pierce, MA, RCEP, CCRP, Dow Adolph, RN, BSN;Carlene Bickley Roseanne Reno, BSN, RN, WTA-C    Virtual Visit No    Medication changes reported     No    Fall or balance concerns reported    No    Tobacco Cessation No Change    Warm-up and Cool-down Performed on first and last piece of equipment    Resistance Training Performed Yes    VAD Patient? No    PAD/SET Patient? No      Pain Assessment   Currently in Pain? No/denies    Pain Score 0-No pain    Multiple Pain Sites No             Capillary Blood Glucose: No results found for this or any previous visit (from the past 24 hours).    Social History   Tobacco Use  Smoking Status Former   Current packs/day: 0.00   Average packs/day: 1 pack/day for 18.0 years (18.0 ttl pk-yrs)   Types: Cigarettes   Start date: 12/17/1948   Quit date: 12/17/1966   Years since quitting: 57.2  Smokeless Tobacco Never    Goals Met:  Independence with exercise equipment Exercise tolerated well No report of concerns or symptoms today Strength training completed today  Goals Unmet:  Not Applicable  Comments: Pt able to follow exercise prescription today without complaint.  Will continue to monitor for progression.

## 2024-03-02 ENCOUNTER — Ambulatory Visit: Payer: Medicare Other | Admitting: Family Medicine

## 2024-03-02 ENCOUNTER — Encounter (HOSPITAL_COMMUNITY)
Admission: RE | Admit: 2024-03-02 | Discharge: 2024-03-02 | Disposition: A | Discharge status: Home / Self Care | Source: Ambulatory Visit | Attending: Cardiology | Admitting: Cardiology

## 2024-03-02 DIAGNOSIS — I214 Non-ST elevation (NSTEMI) myocardial infarction: Secondary | ICD-10-CM | POA: Diagnosis not present

## 2024-03-02 NOTE — Progress Notes (Signed)
 Daily Session Note  Patient Details  Name: Emanuelle Bastos MRN: [id number] Date of Birth: [birth year] Referring Provider:   Flowsheet Row CARDIAC REHAB PHASE II ORIENTATION from 02/18/2024 in [facility] CARDIAC REHABILITATION  Referring Provider Candelaria Stagers MD  [Primary Cardiologist: Dr. Norman Herrlich ([place])]       Encounter Date: 03/02/2024  Check In:  Session Check In - 03/02/24 1057       Check-In   Supervising physician immediately available to respond to emergencies See telemetry face sheet for immediately available MD    Location [facility]    Staff Present Ross Ludwig, BS, Exercise Physiologist;Brittany Roseanne Reno, BSN, RN, WTA-C    Virtual Visit No    Medication changes reported     No    Fall or balance concerns reported    No    Tobacco Cessation No Change    Warm-up and Cool-down Performed on first and last piece of equipment    Resistance Training Performed Yes    VAD Patient? No    PAD/SET Patient? No      Pain Assessment   Currently in Pain? No/denies    Pain Score 0-No pain    Multiple Pain Sites No             Capillary Blood Glucose: No results found for this or any previous visit (from the past 24 hours).    Social History   Tobacco Use  Smoking Status Former   Current packs/day: 0.00   Average packs/day: 1 pack/day for 18.0 years (18.0 ttl pk-yrs)   Types: Cigarettes   Start date: 12/17/1948   Quit date: 12/17/1966   Years since quitting: 57.2  Smokeless Tobacco Never    Goals Met:  Independence with exercise equipment Exercise tolerated well No report of concerns or symptoms today Strength training completed today  Goals Unmet:  Not Applicable  Comments: Pt able to follow exercise prescription today without complaint.  Will continue to monitor for progression.

## 2024-03-03 ENCOUNTER — Other Ambulatory Visit: Payer: Self-pay | Admitting: Family Medicine

## 2024-03-03 DIAGNOSIS — I700 Atherosclerosis of aorta: Secondary | ICD-10-CM

## 2024-03-04 ENCOUNTER — Encounter (HOSPITAL_COMMUNITY)
Admission: RE | Admit: 2024-03-04 | Discharge: 2024-03-04 | Disposition: A | Discharge status: Home / Self Care | Source: Ambulatory Visit | Attending: Cardiology | Admitting: Cardiology

## 2024-03-04 DIAGNOSIS — I214 Non-ST elevation (NSTEMI) myocardial infarction: Secondary | ICD-10-CM

## 2024-03-04 NOTE — Progress Notes (Signed)
 Daily Session Note  Patient Details  Name: Dalton Vaughan MRN: [id number] Date of Birth: [birth year] Referring Provider:   Flowsheet Row CARDIAC REHAB PHASE II ORIENTATION from 02/18/2024 in [place] CARDIAC REHABILITATION  Referring Provider Candelaria Stagers MD  [Primary Cardiologist: Dr. Norman Herrlich ([place])]       Encounter Date: 03/04/2024  Check In:  Session Check In - 03/04/24 1024       Check-In   Supervising physician immediately available to respond to emergencies See telemetry face sheet for immediately available MD    Location [facility]    Staff Present Avanell Shackleton BSN, RN;Heather Fredric Mare, [place], Exercise Physiologist;Debra Laural Benes, RN, BSN    Virtual Visit No    Medication changes reported     No    Fall or balance concerns reported    No    Tobacco Cessation No Change    Warm-up and Cool-down Performed on first and last piece of equipment    Resistance Training Performed Yes    VAD Patient? No    PAD/SET Patient? No      Pain Assessment   Currently in Pain? No/denies    Pain Score 0-No pain    Multiple Pain Sites No             Capillary Blood Glucose: No results found for this or any previous visit (from the past 24 hours).    Social History   Tobacco Use  Smoking Status Former   Current packs/day: 0.00   Average packs/day: 1 pack/day for 18.0 years (18.0 ttl pk-yrs)   Types: Cigarettes   Start date: 12/17/1948   Quit date: 12/17/1966   Years since quitting: 57.2  Smokeless Tobacco Never    Goals Met:  Independence with exercise equipment Exercise tolerated well No report of concerns or symptoms today Strength training completed today  Goals Unmet:  Not Applicable  Comments: Marland KitchenMarland KitchenPt able to follow exercise prescription today without complaint.  Will continue to monitor for progression.

## 2024-03-05 ENCOUNTER — Ambulatory Visit: Payer: Medicare Other

## 2024-03-05 VITALS — BP 128/72 | HR 90 | Ht 72.0 in | Wt 165.0 lb

## 2024-03-05 DIAGNOSIS — M79676 Pain in unspecified toe(s): Secondary | ICD-10-CM | POA: Diagnosis not present

## 2024-03-05 DIAGNOSIS — Z0000 Encounter for general adult medical examination without abnormal findings: Secondary | ICD-10-CM

## 2024-03-05 DIAGNOSIS — L84 Corns and callosities: Secondary | ICD-10-CM | POA: Diagnosis not present

## 2024-03-05 DIAGNOSIS — R270 Ataxia, unspecified: Secondary | ICD-10-CM | POA: Insufficient documentation

## 2024-03-05 DIAGNOSIS — I70203 Unspecified atherosclerosis of native arteries of extremities, bilateral legs: Secondary | ICD-10-CM | POA: Diagnosis not present

## 2024-03-05 DIAGNOSIS — R42 Dizziness and giddiness: Secondary | ICD-10-CM | POA: Insufficient documentation

## 2024-03-05 DIAGNOSIS — B351 Tinea unguium: Secondary | ICD-10-CM | POA: Diagnosis not present

## 2024-03-05 NOTE — Patient Instructions (Signed)
 Dalton Vaughan , Thank you for taking time to come for your Medicare Wellness Visit. I appreciate your ongoing commitment to your health goals. Please review the following plan we discussed and let me know if I can assist you in the future.   Referrals/Orders/Follow-Ups/Clinician Recommendations: Keep up the good work on maintaining your health.  This is a list of the screening recommended for you and due dates:  Health Maintenance  Topic Date Due   COVID-19 Vaccine (3 - 2024-25 season) 03/21/2025*   Medicare Annual Wellness Visit  03/05/2025   DTaP/Tdap/Td vaccine (2 - Td or Tdap) 08/26/2031   Pneumonia Vaccine  Completed   Flu Shot  Completed   Zoster (Shingles) Vaccine  Completed   HPV Vaccine  Aged Out  *Topic was postponed. The date shown is not the original due date.    Advanced directives: (Copy Requested) Please bring a copy of your health care power of attorney and living will to the office to be added to your chart at your convenience. You can mail to [facility] [address] [place], [place] [address] or email to [email or web address]  Next Medicare Annual Wellness Visit scheduled for next year: Yes

## 2024-03-05 NOTE — Progress Notes (Addendum)
 Subjective:   Dalton Vaughan is a 89 y.o. who presents for a Medicare Wellness preventive visit.  Visit Complete: Virtual I connected with  Dalton Vaughan on 03/05/24 by a audio enabled telemedicine application and verified that I am speaking with the correct person using two identifiers.  Patient Location: Home  Provider Location: Home Office  I discussed the limitations of evaluation and management by telemedicine. The patient expressed understanding and agreed to proceed.  Vital Signs: Because this visit was a virtual/telehealth visit, some criteria may be missing or patient reported. Any vitals not documented were not able to be obtained and vitals that have been documented are patient reported.  VideoDeclined- This patient declined Librarian, academic. Therefore the visit was completed with audio only.  Persons Participating in Visit: Patient.  AWV Questionnaire: No: Patient Medicare AWV questionnaire was not completed prior to this visit.  Cardiac Risk Factors include: male gender;dyslipidemia     Objective:    Today's Vitals   03/05/24 1231 03/05/24 1240  BP: 128/72   Pulse: 90   Weight: 165 lb (74.8 kg)   Height: 6' (1.829 m)   PainSc:  0-No pain   Body mass index is 22.38 kg/m.     03/05/2024    4:16 PM 02/18/2024    1:08 PM 01/22/2024    2:34 PM 01/16/2024   11:25 AM 07/17/2023   10:22 AM 06/24/2023   11:27 AM 03/05/2023   12:02 PM  Advanced Directives  Does Patient Have a Medical Advance Directive? Yes Yes No No Yes Yes Yes  Type of Advance Directive Living will Living will   Healthcare Power of [place];Living will Healthcare Power of [place];Living will Healthcare Power of [place];Living will  Does patient want to make changes to medical advance directive?  No - Patient declined   No - Patient declined No - Patient declined   Copy of Healthcare Power of Attorney in Chart?     No - copy requested No - copy requested No - copy requested   Would patient like information on creating a medical advance directive?  No - Patient declined No - Patient declined No - Guardian declined No - Patient declined      Current Medications (verified) Outpatient Encounter Medications as of 03/05/2024  Medication Sig   apixaban (ELIQUIS) 5 MG TABS tablet Take 1 tablet (5 mg total) by mouth 2 (two) times daily.   azelastine (ASTELIN) 0.1 % nasal spray Place 1 spray into both nostrils 2 (two) times daily.   clopidogrel (PLAVIX) 75 MG tablet Take 1 tablet (75 mg total) by mouth daily.   doxazosin (CARDURA) 8 MG tablet Take 1 tablet (8 mg total) by mouth at bedtime.   ezetimibe (ZETIA) 10 MG tablet Take 1 tablet (10 mg total) by mouth daily.   HYDROcodone-acetaminophen (NORCO/VICODIN) 5-325 MG tablet Take 1 tablet by mouth every 8 (eight) hours as needed for severe pain (pain score 7-10).   isosorbide mononitrate (IMDUR) 30 MG 24 hr tablet Take 1 tablet (30 mg total) by mouth daily.   Lactobacillus (PROBIOTIC ACIDOPHILUS) CAPS Take 1 capsule by mouth daily.   levothyroxine (SYNTHROID) 50 MCG tablet Take 1 tablet (50 mcg total) by mouth daily before breakfast.   meclizine (ANTIVERT) 25 MG tablet Take 25 mg by mouth daily as needed for dizziness.   mirtazapine (REMERON) 15 MG tablet Take 1 tablet (15 mg total) by mouth at bedtime.   Multiple Vitamin (MULTIVITAMIN) tablet Take 1 tablet by mouth daily.  ramipril (ALTACE) 10 MG capsule TAKE 1 CAPSULE DAILY   rosuvastatin (CRESTOR) 10 MG tablet Take 1 tablet (10 mg total) by mouth every other day.   SIMETHICONE PO Take 1 tablet by mouth as needed (gas).   vitamin C (ASCORBIC ACID) 500 MG tablet Take 500 mg by mouth daily.    [DISCONTINUED] amLODipine (NORVASC) 5 MG tablet Take 1 tablet (5 mg total) by mouth daily. (Patient not taking: Reported on 02/18/2024)   No facility-administered encounter medications on file as of 03/05/2024.    Allergies (verified) Paxil [paroxetine hcl], Codeine, Eliquis  [apixaban], Pravachol [pravastatin sodium], Zetia [ezetimibe], Penicillins, and Vytorin [ezetimibe-simvastatin]   History: Past Medical History:  Diagnosis Date   Anal fissure    Arthritis    Atrial fibrillation (HCC)    BPH (benign prostatic hypertrophy)    CAD (coronary artery disease)    Dr. Donnie Aho   Cataract    COVID-19    Depression 2004   Diverticulosis    GERD (gastroesophageal reflux disease)    History of TIAs    Hyperlipidemia    Hypertension    Hypertensive heart disease without CHF    Hypothyroidism    Internal hemorrhoids    Lumbar disc disease    NSTEMI (non-ST elevated myocardial infarction) (HCC) 01/22/2024   Oral bleeding 08/20/2012   Following molar tooth extraction July, 2013   Pneumonia    Ruptured disk 1995   Shingles    Thyroid disease    Vitamin D deficiency    Vitreous detachment    Past Surgical History:  Procedure Laterality Date   bilateral cataract surgery  6/07   CARDIAC CATHETERIZATION     CORONARY ARTERY BYPASS GRAFT  12/99    Dr. Maylon Cos ANGIOGRAPHY N/A 01/28/2024   Procedure: CORONARY/GRAFT ANGIOGRAPHY;  Surgeon: Elder Negus, MD;  Location: [facility];  Service: Cardiovascular;  Laterality: N/A;   cysloscopy  8/08   EYE SURGERY     HERNIA REPAIR  1997   right   herninated disc  1995   PACEMAKER INSERTION  11/03/2020   PROSTATE BIOPSY  8/08   TONSILLECTOMY AND ADENOIDECTOMY     Family History  Problem Relation Age of Onset   Dementia Mother    Arthritis Mother    Cancer Brother        prostate   Asthma Sister    Heart disease Paternal Aunt    Heart disease Paternal Uncle    Hypertension Maternal Grandmother    CVA Maternal Grandmother    CVA Paternal Grandmother    Hepatitis C Son    Arthritis Son    Colon cancer Neg Hx    Colon polyps Neg Hx    Kidney disease Neg Hx    Esophageal cancer Neg Hx    Gallbladder disease Neg Hx    Diabetes Neg Hx    Social History   Socioeconomic  History   Marital status: Widowed    Spouse name: Harriett Sine    Number of children: 1   Years of education: 12   Highest education level: Not on file  Occupational History   Occupation: Retired    Comment: Company secretary x 27 years   Occupation: Retired    Associate Professor: Public librarian    Comment: supervisor x 17 years  Tobacco Use   Smoking status: Former    Current packs/day: 0.00    Average packs/day: 1 pack/day for 18.0 years (18.0 ttl pk-yrs)  Types: Cigarettes    Start date: 12/17/1948    Quit date: 12/17/1966    Years since quitting: 57.2   Smokeless tobacco: Never  Vaping Use   Vaping status: Never Used  Substance and Sexual Activity   Alcohol use: Yes    Alcohol/week: 1.0 standard drink of alcohol    Types: 1 Standard drinks or equivalent per week    Comment: rare   Drug use: No   Sexual activity: Not Currently  Other Topics Concern   Not on file  Social History Narrative   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[employer]      Right-handed      Caffeine use: none      Son and grandson live with him   Social Drivers of Corporate investment banker Strain: Low Risk  (03/05/2024)   Overall Financial Resource Strain (CARDIA)    Difficulty of Paying Living Expenses: Not very hard  Food Insecurity: No Food Insecurity (03/05/2024)   Hunger Vital Sign    Worried About Running Out of Food in the Last Year: Never true    Ran Out of Food in the Last Year: Never true  Transportation Needs: No Transportation Needs (03/05/2024)   PRAPARE - Administrator, Civil Service (Medical): No    Lack of Transportation (Non-Medical): No  Physical Activity: Sufficiently Active (03/05/2024)   Exercise Vital Sign    Days of Exercise per Week: 7 days    Minutes of Exercise per Session: 70 min  Stress: No Stress Concern Present (03/05/2024)   [employer] of Occupational Health - Occupational Stress Questionnaire    Feeling of Stress : Not at all  Social  Connections: Socially Isolated (03/05/2024)   Social Connection and Isolation Panel [NHANES]    Frequency of Communication with Friends and Family: More than three times a week    Frequency of Social Gatherings with Friends and Family: Once a week    Attends Religious Services: Never    Database administrator or Organizations: No    Attends Banker Meetings: Never    Marital Status: Widowed    Tobacco Counseling Counseling given: Yes   Clinical Intake:  Pre-visit preparation completed: Yes  Pain : No/denies pain Pain Score: 0-No pain     BMI - recorded: 22.38 Nutritional Risks: None Diabetes: No  Lab Results  Component Value Date   HGBA1C 5.5 02/05/2019   HGBA1C 5.9 (H) 10/13/2015     How often do you need to have someone help you when you read instructions, pamphlets, or other written materials from your doctor or pharmacy?: 1 - Never     Information entered by :: Alia T./cma   Activities of Daily Living     03/05/2024   12:37 PM 01/24/2024    7:00 AM  In your present state of health, do you have any difficulty performing the following activities:  Hearing? 0 0  Vision? 1 1  Difficulty concentrating or making decisions? 0 0  Walking or climbing stairs? 0   Dressing or bathing? 0   Doing errands, shopping? 0   Preparing Food and eating ? N   Using the Toilet? N   In the past six months, have you accidently leaked urine? N   Do you have problems with loss of bowel control? N   Managing your Medications? N   Managing your Finances? N   Housekeeping or managing your Housekeeping? N  Patient Care Team: Raliegh Ip, DO as PCP - General (Family Medicine) Baldo Daub, MD as PCP - Cardiology (Cardiology) Heloise Purpura, MD as Consulting Physician (Urology) Pyrtle, Carie Caddy, MD as Consulting Physician (Gastroenterology) Ernesto Rutherford, MD as Consulting Physician (Ophthalmology) Chales Abrahams, Archie Renford Dills., MD as Referring Physician  (Cardiology)  Indicate any recent Medical Services you may have received from other than [facility] providers in the past year (date may be approximate).     Assessment:   This is a routine wellness examination for Dalton Vaughan.  Hearing/Vision screen Hearing Screening - Comments:: Pt denies hearing def Vision Screening - Comments:: Pt denies hearing def  Dr. Dione Booze   Goals Addressed             This Visit's Progress    Patient Stated       Better control Cholesterol to get down       Depression Screen     03/05/2024   12:43 PM 02/18/2024    1:20 PM 02/14/2024   10:06 AM 02/03/2024    9:51 AM 01/31/2024    1:02 PM 01/07/2024    9:27 AM 12/13/2023   10:37 AM  PHQ 2/9 Scores  PHQ - 2 Score 0 0 0 0 0 0 0  PHQ- 9 Score 0 3 0 0 0 0 0    Fall Risk     03/05/2024   12:43 PM 03/05/2024   12:37 PM 02/18/2024    1:19 PM 02/14/2024   10:06 AM 01/31/2024    1:01 PM  Fall Risk   Falls in the past year? 0 1 0 0 0  Comment per pt      Number falls in past yr: 0 1 0 0 0  Injury with Fall? 0 1 0 0 0  Risk for fall due to : No Fall Risks History of fall(s);Impaired balance/gait;Orthopedic patient No Fall Risks History of fall(s) History of fall(s)  Follow up Falls prevention discussed;Falls evaluation completed Falls prevention discussed;Falls evaluation completed Falls evaluation completed;Education provided;Falls prevention discussed Falls evaluation completed;Education provided Falls evaluation completed;Education provided    MEDICARE RISK AT HOME:  Medicare Risk at Home Any stairs in or around the home?: Yes If so, are there any without handrails?: Yes Home free of loose throw rugs in walkways, pet beds, electrical cords, etc?: Yes Adequate lighting in your home to reduce risk of falls?: Yes Life alert?: Yes Use of a cane, walker or w/c?: No Grab bars in the bathroom?: Yes Shower chair or bench in shower?: No Elevated toilet seat or a handicapped toilet?: Yes  TIMED UP AND GO:  Was the  test performed?  No  Cognitive Function: 6CIT completed    01/26/2019    1:34 PM 01/23/2018    8:58 AM 09/01/2015    2:54 PM  MMSE - Mini Mental State Exam  Orientation to time 5 5 5   Orientation to Place 5 5 5   Registration 3 3 3   Attention/ Calculation 5 5 5   Recall 3 3 3   Language- name 2 objects 2 2 2   Language- repeat 1 1 1   Language- follow 3 step command 3 3 3   Language- read & follow direction 1 1 1   Write a sentence 1 1 1   Copy design 1 1 1   Total score 30 30 30         03/05/2024   12:52 PM 03/05/2023   12:03 PM  6CIT Screen  What Year? 0 points 0 points  What month?  0 points 0 points  What time? 0 points 0 points  Count back from 20 0 points 0 points  Months in reverse 0 points 0 points  Repeat phrase 0 points 0 points  Total Score 0 points 0 points    Immunizations Immunization History  Administered Date(s) Administered   Fluad Quad(high Dose 65+) 10/11/2020, 10/18/2021   Fluad Trivalent(High Dose 65+) 09/10/2023   Influenza, High Dose Seasonal PF 09/15/2013, 09/22/2014, 10/04/2015, 11/05/2016, 10/08/2017, 09/24/2018, 10/19/2019   Influenza, Seasonal, Injecte, Preservative Fre 10/17/2009, 10/16/2010, 10/22/2011, 09/18/2012   Influenza,inj,Quad PF,6+ Mos 09/15/2013, 09/22/2014, 10/04/2015   Influenza-Unspecified 09/17/1997, 09/30/1998, 10/15/2001   Moderna Sars-Covid-2 Vaccination 02/16/2020, 03/15/2020   Pneumococcal Conjugate-13 01/04/2014   Pneumococcal Polysaccharide-23 12/17/1996, 11/15/2015   Tdap 08/25/2021   Zoster Recombinant(Shingrix) 01/26/2019, 05/06/2019    Screening Tests Health Maintenance  Topic Date Due   COVID-19 Vaccine (3 - 2024-25 season) 03/21/2025 (Originally 08/18/2023)   Medicare Annual Wellness (AWV)  03/05/2025   DTaP/Tdap/Td (2 - Td or Tdap) 08/26/2031   Pneumonia Vaccine 60+ Years old  Completed   INFLUENZA VACCINE  Completed   Zoster Vaccines- Shingrix  Completed   HPV VACCINES  Aged Out    Health Maintenance  There  are no preventive care reminders to display for this patient.  Health Maintenance Items Addressed: See Nurse Notes  Additional Screening:  Vision Screening: Recommended annual ophthalmology exams for early detection of glaucoma and other disorders of the eye.  Dental Screening: Recommended annual dental exams for proper oral hygiene  Community Resource Referral / Chronic Care Management: CRR required this visit?  No  CCM required this visit?  No     Plan:     I have personally reviewed and noted the following in the patient's chart:   Medical and social history Use of alcohol, tobacco or illicit drugs  Current medications and supplements including opioid prescriptions. Patient is not currently taking opioid prescriptions. Functional ability and status Nutritional status Physical activity Advanced directives List of other physicians Hospitalizations, surgeries, and ER visits in previous 12 months Vitals Screenings to include cognitive, depression, and falls Referrals and appointments  In addition, I have reviewed and discussed with patient certain preventive protocols, quality metrics, and best practice recommendations. A written personalized care plan for preventive services as well as general preventive health recommendations were provided to patient.     Arta Silence, CMA   03/05/2024   After Visit Summary: (Declined) Due to this being a telephonic visit, with patients personalized plan was offered to patient but patient Declined AVS at this time   Notes:  n/a

## 2024-03-06 ENCOUNTER — Encounter (HOSPITAL_COMMUNITY)
Admission: RE | Admit: 2024-03-06 | Discharge: 2024-03-06 | Disposition: A | Discharge status: Home / Self Care | Source: Ambulatory Visit | Attending: Cardiology | Admitting: Cardiology

## 2024-03-06 DIAGNOSIS — I214 Non-ST elevation (NSTEMI) myocardial infarction: Secondary | ICD-10-CM | POA: Diagnosis not present

## 2024-03-06 NOTE — Progress Notes (Signed)
 Daily Session Note  Patient Details  Name: Dalton Vaughan MRN: [id number] Date of Birth: [birth year] Referring Provider:   Flowsheet Row CARDIAC REHAB PHASE II ORIENTATION from 02/18/2024 in [facility] CARDIAC REHABILITATION  Referring Provider Candelaria Stagers MD  [Primary Cardiologist: Dr. Norman Herrlich ([place])]       Encounter Date: 03/06/2024  Check In:  Session Check In - 03/06/24 1056       Check-In   Supervising physician immediately available to respond to emergencies See telemetry face sheet for immediately available MD    Location [facility]    Staff Present Rodena Medin, RN, BSN;Almedia Cordell Roseanne Reno, BSN, RN, WTA-C;Heather Fredric Mare, BS, Exercise Physiologist;Jessica Juanetta Gosling, MA, RCEP, CCRP, CCET    Virtual Visit No    Medication changes reported     No    Fall or balance concerns reported    No    Tobacco Cessation No Change    Warm-up and Cool-down Performed on first and last piece of equipment    Resistance Training Performed Yes    VAD Patient? No    PAD/SET Patient? No      Pain Assessment   Currently in Pain? No/denies             Capillary Blood Glucose: No results found for this or any previous visit (from the past 24 hours).    Social History   Tobacco Use  Smoking Status Former   Current packs/day: 0.00   Average packs/day: 1 pack/day for 18.0 years (18.0 ttl pk-yrs)   Types: Cigarettes   Start date: 12/17/1948   Quit date: 12/17/1966   Years since quitting: 57.2  Smokeless Tobacco Never    Goals Met:  Independence with exercise equipment No report of concerns or symptoms today Strength training completed today  Goals Unmet:  Not Applicable  Comments: Pt able to follow exercise prescription today without complaint.  Will continue to monitor for progression.

## 2024-03-06 NOTE — Progress Notes (Deleted)
 Marland Kitchen

## 2024-03-09 ENCOUNTER — Encounter (HOSPITAL_COMMUNITY)
Admission: RE | Admit: 2024-03-09 | Discharge: 2024-03-09 | Disposition: A | Discharge status: Home / Self Care | Source: Ambulatory Visit | Attending: Cardiology | Admitting: Cardiology

## 2024-03-09 DIAGNOSIS — M9902 Segmental and somatic dysfunction of thoracic region: Secondary | ICD-10-CM | POA: Diagnosis not present

## 2024-03-09 DIAGNOSIS — M9903 Segmental and somatic dysfunction of lumbar region: Secondary | ICD-10-CM | POA: Diagnosis not present

## 2024-03-09 DIAGNOSIS — M9901 Segmental and somatic dysfunction of cervical region: Secondary | ICD-10-CM | POA: Diagnosis not present

## 2024-03-09 DIAGNOSIS — I214 Non-ST elevation (NSTEMI) myocardial infarction: Secondary | ICD-10-CM | POA: Diagnosis not present

## 2024-03-09 DIAGNOSIS — M62830 Muscle spasm of back: Secondary | ICD-10-CM | POA: Diagnosis not present

## 2024-03-09 NOTE — Progress Notes (Signed)
 Daily Session Note  Patient Details  Name: Dalton Vaughan MRN: [id number] Date of Birth: 07/15/1929 Referring Provider:   Flowsheet Row CARDIAC REHAB PHASE II ORIENTATION from 02/18/2024 in [facility] CARDIAC REHABILITATION  Referring Provider Candelaria Stagers MD  [Primary Cardiologist: Dr. Norman Herrlich ([place])]       Encounter Date: 03/09/2024  Check In:  Session Check In - 03/09/24 1045       Check-In   Supervising physician immediately available to respond to emergencies See telemetry face sheet for immediately available MD    Location [facility]    Staff Present Rodena Medin, RN, BSN;Jessica Juanetta Gosling, MA, RCEP, CCRP, CCET    Virtual Visit No    Medication changes reported     No    Fall or balance concerns reported    No    Warm-up and Cool-down Performed on first and last piece of equipment    Resistance Training Performed Yes    VAD Patient? No    PAD/SET Patient? No      Pain Assessment   Currently in Pain? No/denies    Pain Score 0-No pain    Multiple Pain Sites No             Capillary Blood Glucose: No results found for this or any previous visit (from the past 24 hours).    Social History   Tobacco Use  Smoking Status Former   Current packs/day: 0.00   Average packs/day: 1 pack/day for 18.0 years (18.0 ttl pk-yrs)   Types: Cigarettes   Start date: 12/17/1948   Quit date: 12/17/1966   Years since quitting: 57.2  Smokeless Tobacco Never    Goals Met:  Independence with exercise equipment Exercise tolerated well No report of concerns or symptoms today Strength training completed today  Goals Unmet:  Not Applicable  Comments: Pt able to follow exercise prescription today without complaint.  Will continue to monitor for progression.

## 2024-03-10 DIAGNOSIS — M9902 Segmental and somatic dysfunction of thoracic region: Secondary | ICD-10-CM | POA: Diagnosis not present

## 2024-03-10 DIAGNOSIS — M62830 Muscle spasm of back: Secondary | ICD-10-CM | POA: Diagnosis not present

## 2024-03-10 DIAGNOSIS — M9903 Segmental and somatic dysfunction of lumbar region: Secondary | ICD-10-CM | POA: Diagnosis not present

## 2024-03-10 DIAGNOSIS — M9901 Segmental and somatic dysfunction of cervical region: Secondary | ICD-10-CM | POA: Diagnosis not present

## 2024-03-11 ENCOUNTER — Encounter (HOSPITAL_COMMUNITY): Payer: Self-pay | Admitting: *Deleted

## 2024-03-11 ENCOUNTER — Encounter (HOSPITAL_COMMUNITY)
Admission: RE | Admit: 2024-03-11 | Discharge: 2024-03-11 | Disposition: A | Discharge status: Home / Self Care | Source: Ambulatory Visit | Attending: Cardiology | Admitting: Cardiology

## 2024-03-11 DIAGNOSIS — I214 Non-ST elevation (NSTEMI) myocardial infarction: Secondary | ICD-10-CM

## 2024-03-11 DIAGNOSIS — M9903 Segmental and somatic dysfunction of lumbar region: Secondary | ICD-10-CM | POA: Diagnosis not present

## 2024-03-11 DIAGNOSIS — M9901 Segmental and somatic dysfunction of cervical region: Secondary | ICD-10-CM | POA: Diagnosis not present

## 2024-03-11 DIAGNOSIS — M62830 Muscle spasm of back: Secondary | ICD-10-CM | POA: Diagnosis not present

## 2024-03-11 DIAGNOSIS — M9902 Segmental and somatic dysfunction of thoracic region: Secondary | ICD-10-CM | POA: Diagnosis not present

## 2024-03-11 NOTE — Progress Notes (Signed)
 Daily Session Note  Patient Details  Name: Dalton Vaughan MRN: [id number] Date of Birth: [birth year] Referring Provider:   Flowsheet Row CARDIAC REHAB PHASE II ORIENTATION from 02/18/2024 in [facility] CARDIAC REHABILITATION  Referring Provider Candelaria Stagers MD  [Primary Cardiologist: Dr. Norman Herrlich ([place])]       Encounter Date: 03/11/2024  Check In:  Session Check In - 03/11/24 1020       Check-In   Supervising physician immediately available to respond to emergencies See telemetry face sheet for immediately available MD    Location [facility]    Staff Present Avanell Shackleton BSN, RN;Heather Fredric Mare, BS, Exercise Physiologist;Jessica [place], MA, RCEP, CCRP, CCET    Virtual Visit No    Medication changes reported     No    Fall or balance concerns reported    No    Tobacco Cessation No Change    Warm-up and Cool-down Performed on first and last piece of equipment    Resistance Training Performed Yes    VAD Patient? No    PAD/SET Patient? No      Pain Assessment   Currently in Pain? No/denies    Pain Score 0-No pain    Multiple Pain Sites No             Capillary Blood Glucose: No results found for this or any previous visit (from the past 24 hours).    Social History   Tobacco Use  Smoking Status Former   Current packs/day: 0.00   Average packs/day: 1 pack/day for 18.0 years (18.0 ttl pk-yrs)   Types: Cigarettes   Start date: 12/17/1948   Quit date: 12/17/1966   Years since quitting: 57.2  Smokeless Tobacco Never    Goals Met:  Independence with exercise equipment Exercise tolerated well No report of concerns or symptoms today Strength training completed today  Goals Unmet:  Not Applicable  Comments: Marland KitchenMarland KitchenPt able to follow exercise prescription today without complaint.  Will continue to monitor for progression.

## 2024-03-11 NOTE — Progress Notes (Signed)
 Cardiac Individual Treatment Plan  Patient Details  Name: Dalton Vaughan MRN: [id number] Date of Birth: 1929/07/07 Referring Provider:   Flowsheet Row CARDIAC REHAB PHASE II ORIENTATION from 02/18/2024 in [facility] CARDIAC REHABILITATION  Referring Provider Candelaria Stagers MD  [Primary Cardiologist: Dr. Norman Herrlich ([place])]       Initial Encounter Date:  Flowsheet Row CARDIAC REHAB PHASE II ORIENTATION from 02/18/2024 in [place] CARDIAC REHABILITATION  Date 02/18/24       Visit Diagnosis: NSTEMI (non-ST elevated myocardial infarction) [facility])  Patient's Home Medications on Admission:  Current Outpatient Medications:    apixaban (ELIQUIS) 5 MG TABS tablet, Take 1 tablet (5 mg total) by mouth 2 (two) times daily., Disp: 180 tablet, Rfl: 1   azelastine (ASTELIN) 0.1 % nasal spray, Place 1 spray into both nostrils 2 (two) times daily., Disp: 90 mL, Rfl: 4   clopidogrel (PLAVIX) 75 MG tablet, Take 1 tablet (75 mg total) by mouth daily., Disp: 90 tablet, Rfl: 2   doxazosin (CARDURA) 8 MG tablet, Take 1 tablet (8 mg total) by mouth at bedtime., Disp: 90 tablet, Rfl: 3   ezetimibe (ZETIA) 10 MG tablet, Take 1 tablet (10 mg total) by mouth daily., Disp: 90 tablet, Rfl: 2   HYDROcodone-acetaminophen (NORCO/VICODIN) 5-325 MG tablet, Take 1 tablet by mouth every 8 (eight) hours as needed for severe pain (pain score 7-10)., Disp: 15 tablet, Rfl: 0   isosorbide mononitrate (IMDUR) 30 MG 24 hr tablet, Take 1 tablet (30 mg total) by mouth daily., Disp: 90 tablet, Rfl: 1   Lactobacillus (PROBIOTIC ACIDOPHILUS) CAPS, Take 1 capsule by mouth daily., Disp: , Rfl:    levothyroxine (SYNTHROID) 50 MCG tablet, Take 1 tablet (50 mcg total) by mouth daily before breakfast., Disp: 90 tablet, Rfl: 3   meclizine (ANTIVERT) 25 MG tablet, Take 25 mg by mouth daily as needed for dizziness., Disp: , Rfl:    mirtazapine (REMERON) 15 MG tablet, Take 1 tablet (15 mg total) by mouth at bedtime., Disp: 100 tablet, Rfl: 3    Multiple Vitamin (MULTIVITAMIN) tablet, Take 1 tablet by mouth daily., Disp: , Rfl:    ramipril (ALTACE) 10 MG capsule, TAKE 1 CAPSULE DAILY, Disp: 90 capsule, Rfl: 3   rosuvastatin (CRESTOR) 10 MG tablet, Take 1 tablet (10 mg total) by mouth every other day., Disp: 90 tablet, Rfl: 0   SIMETHICONE PO, Take 1 tablet by mouth as needed (gas)., Disp: , Rfl:    vitamin C (ASCORBIC ACID) 500 MG tablet, Take 500 mg by mouth daily. , Disp: , Rfl:   Past Medical History: Past Medical History:  Diagnosis Date   Anal fissure    Arthritis    Atrial fibrillation (HCC)    BPH (benign prostatic hypertrophy)    CAD (coronary artery disease)    Dr. Donnie Aho   Cataract    COVID-19    Depression 2004   Diverticulosis    GERD (gastroesophageal reflux disease)    History of TIAs    Hyperlipidemia    Hypertension    Hypertensive heart disease without CHF    Hypothyroidism    Internal hemorrhoids    Lumbar disc disease    NSTEMI (non-ST elevated myocardial infarction) (HCC) 01/22/2024   Oral bleeding 08/20/2012   Following molar tooth extraction July, 2013   Pneumonia    Ruptured disk 1995   Shingles    Thyroid disease    Vitamin D deficiency    Vitreous detachment     Tobacco Use: Social  History   Tobacco Use  Smoking Status Former   Current packs/day: 0.00   Average packs/day: 1 pack/day for 18.0 years (18.0 ttl pk-yrs)   Types: Cigarettes   Start date: 12/17/1948   Quit date: 12/17/1966   Years since quitting: 57.2  Smokeless Tobacco Never    Labs: Review Flowsheet  More data exists      Latest Ref Rng & Units 02/05/2019 02/22/2021 12/20/2021 01/01/2023 01/24/2024  Labs for ITP Cardiac and Pulmonary Rehab  Cholestrol 0 - 200 mg/dL 161  096  045  409  811   LDL (calc) 0 - 99 mg/dL 914  782  956  213  086   HDL-C >40 mg/dL 44  54  53  51  43   Trlycerides <150 mg/dL 68  74  78  82  32   Hemoglobin A1c 4.8 - 5.6 % 5.5  - - - -    Capillary Blood Glucose: No results found for:  "GLUCAP"   Exercise Target Goals: Exercise Program Goal: Individual exercise prescription set using results from initial 6 min walk test and THRR while considering  patient's activity barriers and safety.   Exercise Prescription Goal: Starting with aerobic activity 30 plus minutes a day, 3 days per week for initial exercise prescription. Provide home exercise prescription and guidelines that participant acknowledges understanding prior to discharge.  Activity Barriers & Risk Stratification:  Activity Barriers & Cardiac Risk Stratification - 02/18/24 1327       Activity Barriers & Cardiac Risk Stratification   Activity Barriers Balance Concerns;Assistive Device;Deconditioning;Muscular Weakness;Arthritis;Shortness of Breath;Neck/Spine Problems    Cardiac Risk Stratification Moderate             6 Minute Walk:  6 Minute Walk     Row Name 02/18/24 1425         6 Minute Walk   Phase Initial     Distance 1070 feet     Walk Time 6 minutes     # of Rest Breaks 0     MPH 2.07     METS 1.78     RPE 12     VO2 Peak 6.25     Symptoms No     Resting HR 75 bpm     Resting BP 128/72     Resting Oxygen Saturation  97 %     Exercise Oxygen Saturation  during 6 min walk 98 %     Max Ex. HR 108 bpm     Max Ex. BP 144/74     2 Minute Post BP 124/62              Oxygen Initial Assessment:   Oxygen Re-Evaluation:   Oxygen Discharge (Final Oxygen Re-Evaluation):   Initial Exercise Prescription:  Initial Exercise Prescription - 02/18/24 1400       Date of Initial Exercise RX and Referring Provider   Date 02/18/24    Referring Provider Candelaria Stagers MD   Primary Cardiologist: Dr. Norman Herrlich ([place])     Oxygen   Maintain Oxygen Saturation 88% or higher      Treadmill   MPH 1.5    Grade 0.5    Minutes 15    METs 2.25      NuStep   Level 3    SPM 80    Minutes 15    METs 2.2      Prescription Details   Frequency (times per week) 3    Duration Progress  to  30 minutes of continuous aerobic without signs/symptoms of physical distress      Intensity   THRR 40-80% of Max Heartrate 96-117    Ratings of Perceived Exertion 11-13    Perceived Dyspnea 0-4      Progression   Progression Continue to progress workloads to maintain intensity without signs/symptoms of physical distress.      Resistance Training   Training Prescription Yes    Weight 3 lb    Reps 10-15             Perform Capillary Blood Glucose checks as needed.  Exercise Prescription Changes:   Exercise Prescription Changes     Row Name 02/18/24 1400 03/04/24 1300           Response to Exercise   Blood Pressure (Admit) 128/72 122/80      Blood Pressure (Exercise) 144/74 138/80      Blood Pressure (Exit) 124/62 120/62      Heart Rate (Admit) 75 bpm 103 bpm      Heart Rate (Exercise) 108 bpm 113 bpm      Heart Rate (Exit) 78 bpm 102 bpm      Oxygen Saturation (Admit) 97 % --      Oxygen Saturation (Exercise) 98 % --      Rating of Perceived Exertion (Exercise) 12 12      Symptoms none --      Comments walk test results --      Duration -- Continue with 30 min of aerobic exercise without signs/symptoms of physical distress.      Intensity -- THRR unchanged        Progression   Progression -- Continue to progress workloads to maintain intensity without signs/symptoms of physical distress.        Resistance Training   Training Prescription -- Yes      Weight -- 3      Reps -- 10-15        Treadmill   MPH -- 2      Grade -- 1      Minutes -- 15      METs -- 2.81        NuStep   Level -- 4      SPM -- 84      Minutes -- 15      METs -- 2.2               Exercise Comments:   Exercise Comments     Row Name 02/19/24 1027           Exercise Comments First full day of exercise!  Patient was oriented to gym and equipment including functions, settings, policies, and procedures.  Patient's individual exercise prescription and treatment plan were  reviewed.  All starting workloads were established based on the results of the 6 minute walk test done at initial orientation visit.  The plan for exercise progression was also introduced and progression will be customized based on patient's performance and goals.                Exercise Goals and Review:   Exercise Goals     Row Name 02/18/24 1428             Exercise Goals   Increase Physical Activity Yes       Intervention Provide advice, education, support and counseling about physical activity/exercise needs.;Develop an individualized exercise prescription for aerobic and resistive training based on initial evaluation findings, risk stratification,  comorbidities and participant's personal goals.       Expected Outcomes Short Term: Attend rehab on a regular basis to increase amount of physical activity.;Long Term: Add in home exercise to make exercise part of routine and to increase amount of physical activity.;Long Term: Exercising regularly at least 3-5 days a week.       Increase Strength and Stamina Yes       Intervention Develop an individualized exercise prescription for aerobic and resistive training based on initial evaluation findings, risk stratification, comorbidities and participant's personal goals.;Provide advice, education, support and counseling about physical activity/exercise needs.       Expected Outcomes Short Term: Increase workloads from initial exercise prescription for resistance, speed, and METs.;Short Term: Perform resistance training exercises routinely during rehab and add in resistance training at home;Long Term: Improve cardiorespiratory fitness, muscular endurance and strength as measured by increased METs and functional capacity ( )       Able to understand and use rate of perceived exertion (RPE) scale Yes       Intervention Provide education and explanation on how to use RPE scale       Expected Outcomes Short Term: Able to use RPE daily in rehab to  express subjective intensity level;Long Term:  Able to use RPE to guide intensity level when exercising independently       Able to understand and use Dyspnea scale Yes       Intervention Provide education and explanation on how to use Dyspnea scale       Expected Outcomes Short Term: Able to use Dyspnea scale daily in rehab to express subjective sense of shortness of breath during exertion;Long Term: Able to use Dyspnea scale to guide intensity level when exercising independently       Knowledge and understanding of Target Heart Rate Range (THRR) Yes       Intervention Provide education and explanation of THRR including how the numbers were predicted and where they are located for reference       Expected Outcomes Short Term: Able to state/look up THRR;Long Term: Able to use THRR to govern intensity when exercising independently;Short Term: Able to use daily as guideline for intensity in rehab       Able to check pulse independently Yes       Intervention Provide education and demonstration on how to check pulse in carotid and radial arteries.;Review the importance of being able to check your own pulse for safety during independent exercise       Expected Outcomes Short Term: Able to explain why pulse checking is important during independent exercise;Long Term: Able to check pulse independently and accurately       Understanding of Exercise Prescription Yes       Intervention Provide education, explanation, and written materials on patient's individual exercise prescription       Expected Outcomes Short Term: Able to explain program exercise prescription;Long Term: Able to explain home exercise prescription to exercise independently                Exercise Goals Re-Evaluation :  Exercise Goals Re-Evaluation     Row Name 02/19/24 1028             Exercise Goal Re-Evaluation   Exercise Goals Review Knowledge and understanding of Target Heart Rate Range (THRR);Able to understand and use  rate of perceived exertion (RPE) scale       Comments Reviewed RPE and dyspnea scale, THR and program prescription with pt today.  Pt voiced understanding and was given a copy of goals to take home.       Expected Outcomes Short: Use RPE daily to regulate intensity.  Long: Follow program prescription in [facility]                 Discharge Exercise Prescription (Final Exercise Prescription Changes):  Exercise Prescription Changes - 03/04/24 1300       Response to Exercise   Blood Pressure (Admit) 122/80    Blood Pressure (Exercise) 138/80    Blood Pressure (Exit) 120/62    Heart Rate (Admit) 103 bpm    Heart Rate (Exercise) 113 bpm    Heart Rate (Exit) 102 bpm    Rating of Perceived Exertion (Exercise) 12    Duration Continue with 30 min of aerobic exercise without signs/symptoms of physical distress.    Intensity THRR unchanged      Progression   Progression Continue to progress workloads to maintain intensity without signs/symptoms of physical distress.      Resistance Training   Training Prescription Yes    Weight 3    Reps 10-15      Treadmill   MPH 2    Grade 1    Minutes 15    METs 2.81      NuStep   Level 4    SPM 84    Minutes 15    METs 2.2             Nutrition:  Target Goals: Understanding of nutrition guidelines, daily intake of sodium 1500mg , cholesterol 200mg , calories 30% from fat and 7% or less from saturated fats, daily to have 5 or more servings of fruits and vegetables.  Biometrics:  Pre Biometrics - 02/18/24 1428       Pre Biometrics   Height 5' 10.25" (1.784 m)    Weight 165 lb 9.6 oz (75.1 kg)    Waist Circumference 34.5 inches    Hip Circumference 38 inches    Waist to Hip Ratio 0.91 %    BMI (Calculated) 23.6    Grip Strength 26.9 kg    Single Leg Stand 2.6 seconds              Nutrition Therapy Plan and Nutrition Goals:  Nutrition Therapy & Goals - 02/18/24 1325       Intervention Plan   Intervention Prescribe,  educate and counsel regarding individualized specific dietary modifications aiming towards targeted core components such as weight, hypertension, lipid management, diabetes, heart failure and other comorbidities.;Nutrition handout(s) given to patient.    Expected Outcomes Short Term Goal: Understand basic principles of dietary content, such as calories, fat, sodium, cholesterol and nutrients.;Long Term Goal: Adherence to prescribed nutrition plan.             Nutrition Assessments:  MEDIFICTS Score Key: >=70 Need to make dietary changes  40-70 Heart Healthy Diet <= 40 Therapeutic Level Cholesterol Diet  Flowsheet Row CARDIAC REHAB PHASE II ORIENTATION from 02/18/2024 in [facility] CARDIAC REHABILITATION  Picture Your Plate Total Score on Admission 73      Picture Your Plate Scores: <16 Unhealthy dietary pattern with much room for improvement. 41-50 Dietary pattern unlikely to meet recommendations for good health and room for improvement. 51-60 More healthful dietary pattern, with some room for improvement.  >60 Healthy dietary pattern, although there may be some specific behaviors that could be improved.    Nutrition Goals Re-Evaluation:   Nutrition Goals Discharge (Final Nutrition Goals Re-Evaluation):  Psychosocial: Target Goals: Acknowledge presence or absence of significant depression and/or stress, maximize coping skills, provide positive support system. Participant is able to verbalize types and ability to use techniques and skills needed for reducing stress and depression.  Initial Review & Psychosocial Screening:  Initial Psych Review & Screening - 02/18/24 1309       Initial Review   Current issues with Current Anxiety/Panic;Current Sleep Concerns;Current Stress Concerns    Source of Stress Concerns Financial;Chronic Illness;Family    Comments Worried about new onset of gas feeling/ financial issues with medical bills. Lives with son and grandson, loss wife 4 years  ago      Family Dynamics   Good Support System? Yes   Son and grandon   Comments Son and grandson live with the patient, wife passed away about 4 years ago.      Barriers   Psychosocial barriers to participate in program The patient should benefit from training in stress management and relaxation.;Psychosocial barriers identified (see note)      Screening Interventions   Interventions Encouraged to exercise;To provide support and resources with identified psychosocial needs;Provide feedback about the scores to participant    Expected Outcomes Short Term goal: Utilizing psychosocial counselor, staff and physician to assist with identification of specific Stressors or current issues interfering with healing process. Setting desired goal for each stressor or current issue identified.;Long Term Goal: Stressors or current issues are controlled or eliminated.;Short Term goal: Identification and review with participant of any Quality of Life or Depression concerns found by scoring the questionnaire.;Long Term goal: The participant improves quality of Life and PHQ9 Scores as seen by post scores and/or verbalization of changes             Quality of Life Scores:  Quality of Life - 02/18/24 1312       Quality of Life   Select Quality of Life      Quality of Life Scores   Health/Function Pre 23.29 %    Socioeconomic Pre 30 %    Psych/Spiritual Pre 27 %    Family Pre 25.5 %    GLOBAL Pre 25.67 %            Scores of 19 and below usually indicate a poorer quality of life in these areas.  A difference of  2-3 points is a clinically meaningful difference.  A difference of 2-3 points in the total score of the Quality of Life Index has been associated with significant improvement in overall quality of life, self-image, physical symptoms, and general health in studies assessing change in quality of life.  PHQ-9: Review Flowsheet  More data exists      03/05/2024 02/18/2024 02/14/2024 02/03/2024  01/31/2024  Depression screen PHQ 2/9  Decreased Interest 0 0 0 0 0  Down, Depressed, Hopeless 0 0 0 0 0  PHQ - 2 Score 0 0 0 0 0  Altered sleeping 0 1 0 0 0  Tired, decreased energy 0 1 0 0 0  Change in appetite 0 0 0 0 0  Feeling bad or failure about yourself  0 1 0 0 0  Trouble concentrating 0 0 0 0 0  Moving slowly or fidgety/restless 0 0 0 0 0  Suicidal thoughts 0 0 0 0 0  PHQ-9 Score 0 3 0 0 0  Difficult doing work/chores Not difficult at all Not difficult at all Not difficult at all Not difficult at all Not difficult at all   Interpretation of Total  Score  Total Score Depression Severity:  1-4 = Minimal depression, 5-9 = Mild depression, 10-14 = Moderate depression, 15-19 = Moderately severe depression, 20-27 = Severe depression   Psychosocial Evaluation and Intervention:  Psychosocial Evaluation - 02/18/24 1430       Psychosocial Evaluation & Interventions   Interventions Stress management education;Relaxation education;Encouraged to exercise with the program and follow exercise prescription    Comments Manning is coming into cardiac rehab after a NSTEMI.  He had CABG and pacemaker placed in 1999.   Now his grafts are starting to occlude.  They decided to treat medically and he has not have any chest pain.  However, he has noted a "gassy" feeling that makes him feel dizzy.  He has mentioned it to doctor and they are watching it. He wants to know that his heart is okay and he can go and do as he wants like he did since his CABG.  He lives with his son and grandson.  His wife passed 4 years ago and he still struggles with that from time to time. He is worried about finances with all his recent medical bills and has not been sleeping as well since his event.  He does not perceive any barriers to attending rehab and opted for 3 days a week to finish program sooner.    Expected Outcomes Short: Attend rehab to improve stamina and confidence in heart Long: Continue to build his confidence back  up    Continue Psychosocial Services  Follow up required by staff             Psychosocial Re-Evaluation:   Psychosocial Discharge (Final Psychosocial Re-Evaluation):   Vocational Rehabilitation: Provide vocational rehab assistance to qualifying candidates.   Vocational Rehab Evaluation & Intervention:  Vocational Rehab - 02/18/24 1437       Initial Vocational Rehab Evaluation & Intervention   Assessment shows need for Vocational Rehabilitation No   retired            Education: Education Goals: Education classes will be provided on a weekly basis, covering required topics. Participant will state understanding/return demonstration of topics presented.  Learning Barriers/Preferences:  Learning Barriers/Preferences - 02/18/24 1312       Learning Barriers/Preferences   Learning Barriers Sight   Wears glasses   Learning Preferences Audio;Group Instruction;Individual Instruction;Pictoral;Video;Verbal Instruction;Written Material             Education Topics: Hypertension, Hypertension Reduction -Define heart disease and high blood pressure. Discus how high blood pressure affects the body and ways to reduce high blood pressure.   Exercise and Your Heart -Discuss why it is important to exercise, the FITT principles of exercise, normal and abnormal responses to exercise, and how to exercise safely.   Angina -Discuss definition of angina, causes of angina, treatment of angina, and how to decrease risk of having angina.   Cardiac Medications -Review what the following cardiac medications are used for, how they affect the body, and side effects that may occur when taking the medications.  Medications include Aspirin, Beta blockers, calcium channel blockers, ACE Inhibitors, angiotensin receptor blockers, diuretics, digoxin, and antihyperlipidemics.   Congestive Heart Failure -Discuss the definition of CHF, how to live with CHF, the signs and symptoms of CHF, and  how keep track of weight and sodium intake. Flowsheet Row CARDIAC REHAB PHASE II EXERCISE from 03/04/2024 in [place] CARDIAC REHABILITATION  Date 03/04/24  Educator Hb  Instruction Review Code 1- Verbalizes Understanding  Heart Disease and Intimacy -Discus the effect sexual activity has on the heart, how changes occur during intimacy as we age, and safety during sexual activity.   Smoking Cessation / COPD -Discuss different methods to quit smoking, the health benefits of quitting smoking, and the definition of COPD.   Nutrition I: Fats -Discuss the types of cholesterol, what cholesterol does to the heart, and how cholesterol levels can be controlled. Flowsheet Row CARDIAC REHAB PHASE II EXERCISE from 03/04/2024 in [place] CARDIAC REHABILITATION  Date 02/19/24  Educator jh  Instruction Review Code 1- Verbalizes Understanding       Nutrition II: Labels -Discuss the different components of food labels and how to read food label   Heart Parts/Heart Disease and PAD -Discuss the anatomy of the heart, the pathway of blood circulation through the heart, and these are affected by heart disease.   Stress I: Signs and Symptoms -Discuss the causes of stress, how stress may lead to anxiety and depression, and ways to limit stress. Flowsheet Row CARDIAC REHAB PHASE II EXERCISE from 03/04/2024 in [place] CARDIAC REHABILITATION  Date 02/26/24  Educator [facility]  Instruction Review Code 1- Verbalizes Understanding       Stress II: Relaxation -Discuss different types of relaxation techniques to limit stress.   Warning Signs of Stroke / TIA -Discuss definition of a stroke, what the signs and symptoms are of a stroke, and how to identify when someone is having stroke.   Knowledge Questionnaire Score:  Knowledge Questionnaire Score - 02/18/24 1436       Knowledge Questionnaire Score   Pre Score 23/24             Core Components/Risk Factors/Patient Goals at  Admission:  Personal Goals and Risk Factors at Admission - 02/18/24 1437       Core Components/Risk Factors/Patient Goals on Admission    Weight Management Yes;Weight Maintenance    Intervention Weight Management: Develop a combined nutrition and exercise program designed to reach desired caloric intake, while maintaining appropriate intake of nutrient and fiber, sodium and fats, and appropriate energy expenditure required for the weight goal.;Weight Management: Provide education and appropriate resources to help participant work on and attain dietary goals.    Admit Weight 165 lb 9.6 oz (75.1 kg)    Goal Weight: Short Term 165 lb (74.8 kg)    Goal Weight: Long Term 165 lb (74.8 kg)    Expected Outcomes Short Term: Continue to assess and modify interventions until short term weight is achieved;Long Term: Adherence to nutrition and physical activity/exercise program aimed toward attainment of established weight goal;Weight Maintenance: Understanding of the daily nutrition guidelines, which includes 25-35% calories from fat, 7% or less cal from saturated fats, less than 200mg  cholesterol, less than 1.5gm of sodium, & 5 or more servings of fruits and vegetables daily    Hypertension Yes    Intervention Provide education on lifestyle modifcations including regular physical activity/exercise, weight management, moderate sodium restriction and increased consumption of fresh fruit, vegetables, and low fat dairy, alcohol moderation, and smoking cessation.;Monitor prescription use compliance.    Expected Outcomes Short Term: Continued assessment and intervention until BP is < 140/15mm HG in hypertensive participants. < 130/49mm HG in hypertensive participants with diabetes, heart failure or chronic kidney disease.;Long Term: Maintenance of blood pressure at goal levels.    Lipids Yes    Intervention Provide education and support for participant on nutrition & aerobic/resistive exercise along with prescribed  medications to achieve LDL 70mg , HDL >40mg .  Expected Outcomes Short Term: Participant states understanding of desired cholesterol values and is compliant with medications prescribed. Participant is following exercise prescription and nutrition guidelines.;Long Term: Cholesterol controlled with medications as prescribed, with individualized exercise RX and with personalized nutrition plan. Value goals: LDL < 70mg , HDL > 40 mg.             Core Components/Risk Factors/Patient Goals Review:    Core Components/Risk Factors/Patient Goals at Discharge (Final Review):    ITP Comments:  ITP Comments     Row Name 02/18/24 1424 02/19/24 1027 03/11/24 0927       ITP Comments Patient attend orientation today.  Patient is attending Cardiac Rehabilitation Program.  Documentation for diagnosis can be found in [facility] 01/22/24.  Reviewed medical chart, RPE/RPD, gym safety, and program guidelines.  Patient was fitted to equipment they will be using during rehab.  Patient is scheduled to start exercise on Wednesday 02/20/24 at 1100.   Initial ITP created and sent for review and signature by Dr. Dina Rich, Medical Director for Cardiac Rehabilitation Program. First full day of exercise!  Patient was oriented to gym and equipment including functions, settings, policies, and procedures.  Patient's individual exercise prescription and treatment plan were reviewed.  All starting workloads were established based on the results of the 6 minute walk test done at initial orientation visit.  The plan for exercise progression was also introduced and progression will be customized based on patient's performance and goals. 30 day review completed. ITP sent to Dr. Dina Rich, Medical Director of Cardiac Rehab. Continue with ITP unless changes are made by physician.              Comments: 30 day review

## 2024-03-12 DIAGNOSIS — M9901 Segmental and somatic dysfunction of cervical region: Secondary | ICD-10-CM | POA: Diagnosis not present

## 2024-03-12 DIAGNOSIS — M62830 Muscle spasm of back: Secondary | ICD-10-CM | POA: Diagnosis not present

## 2024-03-12 DIAGNOSIS — M9903 Segmental and somatic dysfunction of lumbar region: Secondary | ICD-10-CM | POA: Diagnosis not present

## 2024-03-12 DIAGNOSIS — M9902 Segmental and somatic dysfunction of thoracic region: Secondary | ICD-10-CM | POA: Diagnosis not present

## 2024-03-13 ENCOUNTER — Encounter (HOSPITAL_COMMUNITY)
Admission: RE | Admit: 2024-03-13 | Discharge: 2024-03-13 | Disposition: A | Discharge status: Home / Self Care | Source: Ambulatory Visit | Attending: Cardiology | Admitting: Cardiology

## 2024-03-13 DIAGNOSIS — I214 Non-ST elevation (NSTEMI) myocardial infarction: Secondary | ICD-10-CM

## 2024-03-13 NOTE — Progress Notes (Signed)
 Daily Session Note  Patient Details  Name: Dalton Vaughan MRN: [id number] Date of Birth: 1929/11/03 Referring Provider:   Flowsheet Row CARDIAC REHAB PHASE II ORIENTATION from 02/18/2024 in [facility] CARDIAC REHABILITATION  Referring Provider Candelaria Stagers MD  [Primary Cardiologist: Dr. Norman Herrlich ([place])]       Encounter Date: 03/13/2024  Check In:  Session Check In - 03/13/24 1045       Check-In   Supervising physician immediately available to respond to emergencies See telemetry face sheet for immediately available MD    Location [facility]    Staff Present Ross Ludwig, BS, Exercise Physiologist;Isrrael Fluckiger Laural Benes, RN, Bonney Roussel, RN    Virtual Visit No    Medication changes reported     No    Fall or balance concerns reported    No    Warm-up and Cool-down Performed on first and last piece of equipment    Resistance Training Performed Yes    VAD Patient? No    PAD/SET Patient? No      Pain Assessment   Currently in Pain? No/denies    Pain Score 0-No pain    Multiple Pain Sites No             Capillary Blood Glucose: No results found for this or any previous visit (from the past 24 hours).    Social History   Tobacco Use  Smoking Status Former   Current packs/day: 0.00   Average packs/day: 1 pack/day for 18.0 years (18.0 ttl pk-yrs)   Types: Cigarettes   Start date: 12/17/1948   Quit date: 12/17/1966   Years since quitting: 57.2  Smokeless Tobacco Never    Goals Met:  Independence with exercise equipment Exercise tolerated well No report of concerns or symptoms today Strength training completed today  Goals Unmet:  Not Applicable  Comments: Pt able to follow exercise prescription today without complaint.  Will continue to monitor for progression.

## 2024-03-16 ENCOUNTER — Encounter (HOSPITAL_COMMUNITY)
Admission: RE | Admit: 2024-03-16 | Discharge: 2024-03-16 | Disposition: A | Discharge status: Home / Self Care | Source: Ambulatory Visit | Attending: Cardiology | Admitting: Cardiology

## 2024-03-16 DIAGNOSIS — M9901 Segmental and somatic dysfunction of cervical region: Secondary | ICD-10-CM | POA: Diagnosis not present

## 2024-03-16 DIAGNOSIS — I214 Non-ST elevation (NSTEMI) myocardial infarction: Secondary | ICD-10-CM

## 2024-03-16 DIAGNOSIS — M62830 Muscle spasm of back: Secondary | ICD-10-CM | POA: Diagnosis not present

## 2024-03-16 DIAGNOSIS — M9903 Segmental and somatic dysfunction of lumbar region: Secondary | ICD-10-CM | POA: Diagnosis not present

## 2024-03-16 DIAGNOSIS — M9902 Segmental and somatic dysfunction of thoracic region: Secondary | ICD-10-CM | POA: Diagnosis not present

## 2024-03-16 NOTE — Progress Notes (Signed)
 Daily Session Note  Patient Details  Name: Harles Evetts MRN: [id number] Date of Birth: June 26, 1929 Referring Provider:   Flowsheet Row CARDIAC REHAB PHASE II ORIENTATION from 02/18/2024 in [facility] CARDIAC REHABILITATION  Referring Provider Candelaria Stagers MD  [Primary Cardiologist: Dr. Norman Herrlich ([place])]       Encounter Date: 03/16/2024  Check In:  Session Check In - 03/16/24 1045       Check-In   Supervising physician immediately available to respond to emergencies See telemetry face sheet for immediately available ER MD    Location [facility]    Staff Present Ross Ludwig, BS, Exercise Physiologist;Marlene Pfluger Laural Benes, RN, Thomos Lemons, MA, RCEP, CCRP, CCET    Virtual Visit No    Medication changes reported     No    Fall or balance concerns reported    No    Warm-up and Cool-down Performed on first and last piece of equipment    Resistance Training Performed Yes    VAD Patient? No    PAD/SET Patient? No      Pain Assessment   Currently in Pain? No/denies    Pain Score 0-No pain    Multiple Pain Sites No             Capillary Blood Glucose: No results found for this or any previous visit (from the past 24 hours).    Social History   Tobacco Use  Smoking Status Former   Current packs/day: 0.00   Average packs/day: 1 pack/day for 18.0 years (18.0 ttl pk-yrs)   Types: Cigarettes   Start date: 12/17/1948   Quit date: 12/17/1966   Years since quitting: 57.2  Smokeless Tobacco Never    Goals Met:  Independence with exercise equipment Exercise tolerated well No report of concerns or symptoms today Strength training completed today  Goals Unmet:  Not Applicable  Comments: Pt able to follow exercise prescription today without complaint.  Will continue to monitor for progression.

## 2024-03-18 ENCOUNTER — Encounter (HOSPITAL_COMMUNITY)
Admission: RE | Admit: 2024-03-18 | Discharge: 2024-03-18 | Disposition: A | Discharge status: Home / Self Care | Source: Ambulatory Visit | Attending: Cardiology | Admitting: Cardiology

## 2024-03-18 DIAGNOSIS — M9902 Segmental and somatic dysfunction of thoracic region: Secondary | ICD-10-CM | POA: Diagnosis not present

## 2024-03-18 DIAGNOSIS — M62830 Muscle spasm of back: Secondary | ICD-10-CM | POA: Diagnosis not present

## 2024-03-18 DIAGNOSIS — M9901 Segmental and somatic dysfunction of cervical region: Secondary | ICD-10-CM | POA: Diagnosis not present

## 2024-03-18 DIAGNOSIS — I214 Non-ST elevation (NSTEMI) myocardial infarction: Secondary | ICD-10-CM | POA: Diagnosis not present

## 2024-03-18 DIAGNOSIS — M9903 Segmental and somatic dysfunction of lumbar region: Secondary | ICD-10-CM | POA: Diagnosis not present

## 2024-03-18 NOTE — Progress Notes (Signed)
 Daily Session Note  Patient Details  Name: Dalton Vaughan MRN: [id number] Date of Birth: Oct 19, 1929 Referring Provider:   Flowsheet Row CARDIAC REHAB PHASE II ORIENTATION from 02/18/2024 in [facility] CARDIAC REHABILITATION  Referring Provider Candelaria Stagers MD  [Primary Cardiologist: Dr. Norman Herrlich ([place])]       Encounter Date: 03/18/2024  Check In:  Session Check In - 03/18/24 1027       Check-In   Supervising physician immediately available to respond to emergencies See telemetry face sheet for immediately available MD    Location [facility]    Staff Present Ross Ludwig, BS, Exercise Physiologist;Jessica Juanetta Gosling, MA, RCEP, CCRP, CCET;Emmalyne Giacomo Roseanne Reno, BSN, RN, WTA-C    Virtual Visit No    Medication changes reported     No    Fall or balance concerns reported    No    Tobacco Cessation No Change    Warm-up and Cool-down Performed on first and last piece of equipment    Resistance Training Performed Yes    VAD Patient? No    PAD/SET Patient? No      Pain Assessment   Currently in Pain? No/denies             Capillary Blood Glucose: No results found for this or any previous visit (from the past 24 hours).    Social History   Tobacco Use  Smoking Status Former   Current packs/day: 0.00   Average packs/day: 1 pack/day for 18.0 years (18.0 ttl pk-yrs)   Types: Cigarettes   Start date: 12/17/1948   Quit date: 12/17/1966   Years since quitting: 57.2  Smokeless Tobacco Never    Goals Met:  Independence with exercise equipment Exercise tolerated well No report of concerns or symptoms today Strength training completed today  Goals Unmet:  Not Applicable  Comments: Pt able to follow exercise prescription today without complaint.  Will continue to monitor for progression.

## 2024-03-19 DIAGNOSIS — M9903 Segmental and somatic dysfunction of lumbar region: Secondary | ICD-10-CM | POA: Diagnosis not present

## 2024-03-19 DIAGNOSIS — M9902 Segmental and somatic dysfunction of thoracic region: Secondary | ICD-10-CM | POA: Diagnosis not present

## 2024-03-19 DIAGNOSIS — M62830 Muscle spasm of back: Secondary | ICD-10-CM | POA: Diagnosis not present

## 2024-03-19 DIAGNOSIS — M9901 Segmental and somatic dysfunction of cervical region: Secondary | ICD-10-CM | POA: Diagnosis not present

## 2024-03-20 ENCOUNTER — Encounter (HOSPITAL_COMMUNITY)
Admission: RE | Admit: 2024-03-20 | Discharge: 2024-03-20 | Disposition: A | Discharge status: Home / Self Care | Source: Ambulatory Visit | Attending: Cardiology | Admitting: Cardiology

## 2024-03-20 DIAGNOSIS — I214 Non-ST elevation (NSTEMI) myocardial infarction: Secondary | ICD-10-CM

## 2024-03-20 NOTE — Progress Notes (Signed)
 Daily Session Note  Patient Details  Name: Dalton Vaughan MRN: [id number] Date of Birth: 07-13-1929 Referring Provider:   Flowsheet Row CARDIAC REHAB PHASE II ORIENTATION from 02/18/2024 in [facility] CARDIAC REHABILITATION  Referring Provider Candelaria Stagers MD  [Primary Cardiologist: Dr. Norman Herrlich ([place])]       Encounter Date: 03/20/2024  Check In:  Session Check In - 03/20/24 1045       Check-In   Supervising physician immediately available to respond to emergencies See telemetry face sheet for immediately available MD    Location [facility]    Staff Present Terrance Mass, Margarite Gouge, RN, Joesph Fillers, RN    Virtual Visit Yes    Medication changes reported     No    Fall or balance concerns reported    No    Resistance Training Performed Yes    VAD Patient? No    PAD/SET Patient? No      Pain Assessment   Currently in Pain? No/denies    Multiple Pain Sites No             Capillary Blood Glucose: No results found for this or any previous visit (from the past 24 hours).    Social History   Tobacco Use  Smoking Status Former   Current packs/day: 0.00   Average packs/day: 1 pack/day for 18.0 years (18.0 ttl pk-yrs)   Types: Cigarettes   Start date: 12/17/1948   Quit date: 12/17/1966   Years since quitting: 57.2  Smokeless Tobacco Never    Goals Met:  Independence with exercise equipment Exercise tolerated well Strength training completed today  Goals Unmet:  Not Applicable  Comments: Pt able to follow exercise prescription today without complaint.  Will continue to monitor for progression.

## 2024-03-23 ENCOUNTER — Encounter (HOSPITAL_COMMUNITY)
Admission: RE | Admit: 2024-03-23 | Discharge: 2024-03-23 | Disposition: A | Discharge status: Home / Self Care | Source: Ambulatory Visit | Attending: Cardiology | Admitting: Cardiology

## 2024-03-23 DIAGNOSIS — I214 Non-ST elevation (NSTEMI) myocardial infarction: Secondary | ICD-10-CM | POA: Diagnosis not present

## 2024-03-23 DIAGNOSIS — M9903 Segmental and somatic dysfunction of lumbar region: Secondary | ICD-10-CM | POA: Diagnosis not present

## 2024-03-23 DIAGNOSIS — M62830 Muscle spasm of back: Secondary | ICD-10-CM | POA: Diagnosis not present

## 2024-03-23 DIAGNOSIS — M9901 Segmental and somatic dysfunction of cervical region: Secondary | ICD-10-CM | POA: Diagnosis not present

## 2024-03-23 DIAGNOSIS — M9902 Segmental and somatic dysfunction of thoracic region: Secondary | ICD-10-CM | POA: Diagnosis not present

## 2024-03-23 NOTE — Progress Notes (Signed)
 Daily Session Note  Patient Details  Name: Dalton Vaughan MRN: [id number] Date of Birth: [birth year] Referring Provider:   Flowsheet Row CARDIAC REHAB PHASE II ORIENTATION from 02/18/2024 in [facility] CARDIAC REHABILITATION  Referring Provider Candelaria Stagers MD  [Primary Cardiologist: Dr. Norman Herrlich ([place])]       Encounter Date: 03/23/2024  Check In:  Session Check In - 03/23/24 1106       Check-In   Supervising physician immediately available to respond to emergencies See telemetry face sheet for immediately available MD    Staff Present Fabio Pierce, MA, RCEP, CCRP, CCET;Brittany Roseanne Reno, BSN, RN, WTA-C    Virtual Visit No    Medication changes reported     No    Fall or balance concerns reported    No    Warm-up and Cool-down Performed on first and last piece of equipment    Resistance Training Performed Yes    VAD Patient? No    PAD/SET Patient? No      Pain Assessment   Currently in Pain? No/denies             Capillary Blood Glucose: No results found for this or any previous visit (from the past 24 hours).    Social History   Tobacco Use  Smoking Status Former   Current packs/day: 0.00   Average packs/day: 1 pack/day for 18.0 years (18.0 ttl pk-yrs)   Types: Cigarettes   Start date: 12/17/1948   Quit date: 12/17/1966   Years since quitting: 57.3  Smokeless Tobacco Never    Goals Met:  Proper associated with RPD/PD & O2 Sat Independence with exercise equipment Exercise tolerated well No report of concerns or symptoms today Strength training completed today  Goals Unmet:  Not Applicable  Comments: Pt able to follow exercise prescription today without complaint.  Will continue to monitor for progression.

## 2024-03-25 ENCOUNTER — Encounter (HOSPITAL_COMMUNITY)
Admission: RE | Admit: 2024-03-25 | Discharge: 2024-03-25 | Disposition: A | Discharge status: Home / Self Care | Source: Ambulatory Visit | Attending: Cardiology | Admitting: Cardiology

## 2024-03-25 DIAGNOSIS — M62830 Muscle spasm of back: Secondary | ICD-10-CM | POA: Diagnosis not present

## 2024-03-25 DIAGNOSIS — M9903 Segmental and somatic dysfunction of lumbar region: Secondary | ICD-10-CM | POA: Diagnosis not present

## 2024-03-25 DIAGNOSIS — I214 Non-ST elevation (NSTEMI) myocardial infarction: Secondary | ICD-10-CM

## 2024-03-25 DIAGNOSIS — M9901 Segmental and somatic dysfunction of cervical region: Secondary | ICD-10-CM | POA: Diagnosis not present

## 2024-03-25 DIAGNOSIS — M9902 Segmental and somatic dysfunction of thoracic region: Secondary | ICD-10-CM | POA: Diagnosis not present

## 2024-03-25 NOTE — Progress Notes (Signed)
 Daily Session Note  Patient Details  Name: Dalton Vaughan MRN: [id number] Date of Birth: June 15, 1929 Referring Provider:   Flowsheet Row CARDIAC REHAB PHASE II ORIENTATION from 02/18/2024 in [facility] CARDIAC REHABILITATION  Referring Provider Candelaria Stagers MD  [Primary Cardiologist: Dr. Norman Herrlich ([place])]       Encounter Date: 03/25/2024  Check In:  Session Check In - 03/25/24 1020       Check-In   Supervising physician immediately available to respond to emergencies See telemetry face sheet for immediately available MD    Location [facility]    Staff Present Avanell Shackleton BSN, RN;Heather Fredric Mare, BS, Exercise Physiologist;Jessica [place], MA, RCEP, CCRP, CCET    Virtual Visit No    Medication changes reported     No    Fall or balance concerns reported    No    Tobacco Cessation No Change    Warm-up and Cool-down Performed on first and last piece of equipment    Resistance Training Performed Yes    VAD Patient? No    PAD/SET Patient? No      Pain Assessment   Currently in Pain? No/denies    Pain Score 0-No pain    Multiple Pain Sites No             Capillary Blood Glucose: No results found for this or any previous visit (from the past 24 hours).    Social History   Tobacco Use  Smoking Status Former   Current packs/day: 0.00   Average packs/day: 1 pack/day for 18.0 years (18.0 ttl pk-yrs)   Types: Cigarettes   Start date: 12/17/1948   Quit date: 12/17/1966   Years since quitting: 57.3  Smokeless Tobacco Never    Goals Met:  Independence with exercise equipment Exercise tolerated well No report of concerns or symptoms today Strength training completed today  Goals Unmet:  Not Applicable  Comments: Marland KitchenMarland KitchenPt able to follow exercise prescription today without complaint.  Will continue to monitor for progression.

## 2024-03-26 ENCOUNTER — Encounter: Payer: Self-pay | Admitting: Gastroenterology

## 2024-03-26 ENCOUNTER — Ambulatory Visit: Payer: TRICARE For Life (TFL) | Admitting: Gastroenterology

## 2024-03-26 VITALS — BP 150/90 | HR 75 | Ht 72.0 in | Wt 162.0 lb

## 2024-03-26 DIAGNOSIS — K219 Gastro-esophageal reflux disease without esophagitis: Secondary | ICD-10-CM

## 2024-03-26 DIAGNOSIS — I25119 Atherosclerotic heart disease of native coronary artery with unspecified angina pectoris: Secondary | ICD-10-CM

## 2024-03-26 DIAGNOSIS — K5900 Constipation, unspecified: Secondary | ICD-10-CM

## 2024-03-26 DIAGNOSIS — I480 Paroxysmal atrial fibrillation: Secondary | ICD-10-CM | POA: Diagnosis not present

## 2024-03-26 DIAGNOSIS — M9903 Segmental and somatic dysfunction of lumbar region: Secondary | ICD-10-CM | POA: Diagnosis not present

## 2024-03-26 DIAGNOSIS — R143 Flatulence: Secondary | ICD-10-CM

## 2024-03-26 DIAGNOSIS — M9902 Segmental and somatic dysfunction of thoracic region: Secondary | ICD-10-CM | POA: Diagnosis not present

## 2024-03-26 DIAGNOSIS — M9901 Segmental and somatic dysfunction of cervical region: Secondary | ICD-10-CM | POA: Diagnosis not present

## 2024-03-26 DIAGNOSIS — R142 Eructation: Secondary | ICD-10-CM | POA: Diagnosis not present

## 2024-03-26 DIAGNOSIS — Z7901 Long term (current) use of anticoagulants: Secondary | ICD-10-CM

## 2024-03-26 DIAGNOSIS — M62830 Muscle spasm of back: Secondary | ICD-10-CM | POA: Diagnosis not present

## 2024-03-26 DIAGNOSIS — I482 Chronic atrial fibrillation, unspecified: Secondary | ICD-10-CM

## 2024-03-26 MED ORDER — FAMOTIDINE 40 MG PO TABS: 40.0000 mg | ORAL_TABLET | Freq: Every day | ORAL | 3 refills | Status: DC

## 2024-03-26 NOTE — Patient Instructions (Signed)
 We have sent the following medications to your pharmacy for you to pick up at your convenience: Famotidine 40mg  daily   _______________________________________________________  If your blood pressure at your visit was 140/90 or greater, please contact your primary care physician to follow up on this.  _______________________________________________________  If you are age 88 or older, your body mass index should be between 23-30. Your Body mass index is 21.97 kg/m. If this is out of the aforementioned range listed, please consider follow up with your Primary Care Provider.  If you are age 20 or younger, your body mass index should be between 19-25. Your Body mass index is 21.97 kg/m. If this is out of the aformentioned range listed, please consider follow up with your Primary Care Provider.   ________________________________________________________  The [facility] [facility] providers would like to encourage you to use [facility] to communicate with providers for non-urgent requests or questions.  Due to long hold times on the telephone, sending your provider a message by [facility] may be a faster and more efficient way to get a response.  Please allow 48 business hours for a response.  Please remember that this is for non-urgent requests.  _______________________________________________________ Thank you for trusting me with your gastrointestinal care!   Boone Master, PA

## 2024-03-26 NOTE — Progress Notes (Signed)
 Chief Complaint: Gas Primary GI MD: Dr. Rhea Belton  HPI: Discussed the use of AI scribe software for clinical note transcription with the patient, who gave verbal consent to proceed.  History of Present Illness Dalton Vaughan is a 89 year old male with atrial fibrillation, NSTEMI, CAD s/p CABG, pacemaker, chronic constipation who presents with gas buildup and irregular heartbeat.  He experiences daily gas buildup, ongoing for the past two to three years, initially occurring once or twice a week. The sensation is described as needing to burp but being unable to do so, with relief occurring when he is able to burp or pass gas. Some days are worse than others, with episodes lasting a couple of hours or more. During episodes of trapped gas, he also experiences irregular heartbeats. He has a history of atrial fibrillation and is currently on Eliquis and has a pacemaker. He notes that when he does not have gas, he does not experience atrial fibrillation.  He reports frequent heartburn but is not currently taking any medication for it. Certain foods exacerbate his symptoms, particularly raw foods, which do not agree with him.  He has a history of constipation and recalls a CT scan from 2022 showing significant stool retention. He typically has one bowel movement per day and has been consuming a lot of fiber.   He is currently taking simethicone for gas relief and a probiotic, acidophilus. No current use of hydrocodone, which he has not taken in a long time.    PREVIOUS GI WORKUP   CT abdomen pelvis with contrast 10/2021 - Stable large stool burden - Colonic diverticulosis - Small umbilical hernia  He had a flexible sigmoidoscopy in April 2016 that showed sigmoid diverticulosis.   Past Medical History:  Diagnosis Date   Anal fissure    Arthritis    Atrial fibrillation (HCC)    BPH (benign prostatic hypertrophy)    CAD (coronary artery disease)    Dr. Donnie Aho   Cataract    COVID-19     Depression 2004   Diverticulosis    GERD (gastroesophageal reflux disease)    History of TIAs    Hyperlipidemia    Hypertension    Hypertensive heart disease without CHF    Hypothyroidism    Internal hemorrhoids    Lumbar disc disease    NSTEMI (non-ST elevated myocardial infarction) (HCC) 01/22/2024   Oral bleeding 08/20/2012   Following molar tooth extraction July, 2013   Pneumonia    Ruptured disk 1995   Shingles    Thyroid disease    Vitamin D deficiency    Vitreous detachment     Past Surgical History:  Procedure Laterality Date   bilateral cataract surgery  6/07   CARDIAC CATHETERIZATION     CORONARY ARTERY BYPASS GRAFT  12/99    Dr. Maylon Cos ANGIOGRAPHY N/A 01/28/2024   Procedure: CORONARY/GRAFT ANGIOGRAPHY;  Surgeon: Elder Negus, MD;  Location: [facility];  Service: Cardiovascular;  Laterality: N/A;   cysloscopy  8/08   EYE SURGERY     HERNIA REPAIR  1997   right   herninated disc  1995   PACEMAKER INSERTION  11/03/2020   PROSTATE BIOPSY  8/08   TONSILLECTOMY AND ADENOIDECTOMY      Current Outpatient Medications  Medication Sig Dispense Refill   apixaban (ELIQUIS) 5 MG TABS tablet Take 1 tablet (5 mg total) by mouth 2 (two) times daily. 180 tablet 1   azelastine (ASTELIN) 0.1 % nasal spray  Place 1 spray into both nostrils 2 (two) times daily. 90 mL 4   clopidogrel (PLAVIX) 75 MG tablet Take 1 tablet (75 mg total) by mouth daily. 90 tablet 2   doxazosin (CARDURA) 8 MG tablet Take 1 tablet (8 mg total) by mouth at bedtime. 90 tablet 3   ezetimibe (ZETIA) 10 MG tablet Take 1 tablet (10 mg total) by mouth daily. 90 tablet 2   famotidine (PEPCID) 40 MG tablet Take 1 tablet (40 mg total) by mouth daily. 30 tablet 3   isosorbide mononitrate (IMDUR) 30 MG 24 hr tablet Take 1 tablet (30 mg total) by mouth daily. 90 tablet 1   Lactobacillus (PROBIOTIC ACIDOPHILUS) CAPS Take 1 capsule by mouth daily.     levothyroxine (SYNTHROID) 50 MCG  tablet Take 1 tablet (50 mcg total) by mouth daily before breakfast. 90 tablet 3   mirtazapine (REMERON) 15 MG tablet Take 1 tablet (15 mg total) by mouth at bedtime. 100 tablet 3   Multiple Vitamin (MULTIVITAMIN) tablet Take 1 tablet by mouth daily.     ramipril (ALTACE) 10 MG capsule TAKE 1 CAPSULE DAILY 90 capsule 3   rosuvastatin (CRESTOR) 10 MG tablet Take 1 tablet (10 mg total) by mouth every other day. 90 tablet 0   SIMETHICONE PO Take 1 tablet by mouth as needed (gas).     vitamin C (ASCORBIC ACID) 500 MG tablet Take 500 mg by mouth daily.      HYDROcodone-acetaminophen (NORCO/VICODIN) 5-325 MG tablet Take 1 tablet by mouth every 8 (eight) hours as needed for severe pain (pain score 7-10). (Patient not taking: Reported on 03/26/2024) 15 tablet 0   meclizine (ANTIVERT) 25 MG tablet Take 25 mg by mouth daily as needed for dizziness. (Patient not taking: Reported on 03/26/2024)     No current facility-administered medications for this visit.    Allergies as of 03/26/2024 - Review Complete 03/26/2024  Allergen Reaction Noted   Paxil [paroxetine hcl] Palpitations 04/22/2013   Codeine Other (See Comments) 04/24/2016   Eliquis [apixaban] Other (See Comments) 04/22/2013   Pravachol [pravastatin sodium] Other (See Comments) 08/26/2012   Zetia [ezetimibe] Other (See Comments) 08/26/2012   Penicillins Hives, Rash, and Other (See Comments) 07/05/2012   Vytorin [ezetimibe-simvastatin] Other (See Comments) 04/22/2013    Family History  Problem Relation Age of Onset   Dementia Mother    Arthritis Mother    Cancer Brother        prostate   Asthma Sister    Heart disease Paternal Aunt    Heart disease Paternal Uncle    Hypertension Maternal Grandmother    CVA Maternal Grandmother    CVA Paternal Grandmother    Hepatitis C Son    Arthritis Son    Colon cancer Neg Hx    Colon polyps Neg Hx    Kidney disease Neg Hx    Esophageal cancer Neg Hx    Gallbladder disease Neg Hx    Diabetes Neg  Hx     Social History   Socioeconomic History   Marital status: Widowed    Spouse name: Harriett Sine    Number of children: 1   Years of education: 12   Highest education level: Not on file  Occupational History   Occupation: Retired    Comment: Company secretary x 27 years   Occupation: Retired    Associate Professor: Public librarian    Comment: supervisor x 17 years  Tobacco Use   Smoking status: Former    Current packs/day:  0.00    Average packs/day: 1 pack/day for 18.0 years (18.0 ttl pk-yrs)    Types: Cigarettes    Start date: 12/17/1948    Quit date: 12/17/1966    Years since quitting: 57.3   Smokeless tobacco: Never  Vaping Use   Vaping status: Never Used  Substance and Sexual Activity   Alcohol use: Yes    Alcohol/week: 1.0 standard drink of alcohol    Types: 1 Standard drinks or equivalent per week    Comment: rare   Drug use: No   Sexual activity: Not Currently  Other Topics Concern   Not on file  Social History Narrative   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[employer]      Right-handed      Caffeine use: none      Son and grandson live with him   Social Drivers of Corporate investment banker Strain: Low Risk  (03/05/2024)   Overall Financial Resource Strain (CARDIA)    Difficulty of Paying Living Expenses: Not very hard  Food Insecurity: No Food Insecurity (03/05/2024)   Hunger Vital Sign    Worried About Running Out of Food in the Last Year: Never true    Ran Out of Food in the Last Year: Never true  Transportation Needs: No Transportation Needs (03/05/2024)   PRAPARE - Administrator, Civil Service (Medical): No    Lack of Transportation (Non-Medical): No  Physical Activity: Sufficiently Active (03/05/2024)   Exercise Vital Sign    Days of Exercise per Week: 7 days    Minutes of Exercise per Session: 70 min  Stress: No Stress Concern Present (03/05/2024)   [employer] of Occupational Health - Occupational Stress Questionnaire     Feeling of Stress : Not at all  Social Connections: Socially Isolated (03/05/2024)   Social Connection and Isolation Panel [NHANES]    Frequency of Communication with Friends and Family: More than three times a week    Frequency of Social Gatherings with Friends and Family: Once a week    Attends Religious Services: Never    Database administrator or Organizations: No    Attends Banker Meetings: Never    Marital Status: Widowed  Intimate Partner Violence: Not At Risk (03/05/2024)   Humiliation, Afraid, Rape, and Kick questionnaire    Fear of Current or Ex-Partner: No    Emotionally Abused: No    Physically Abused: No    Sexually Abused: No    Review of Systems:    Constitutional: No weight loss, fever, chills, weakness or fatigue HEENT: Eyes: No change in vision               Ears, Nose, Throat:  No change in hearing or congestion Skin: No rash or itching Cardiovascular: No chest pain, chest pressure or palpitations   Respiratory: No SOB or cough Gastrointestinal: See HPI and otherwise negative Genitourinary: No dysuria or change in urinary frequency Neurological: No headache, dizziness or syncope Musculoskeletal: No new muscle or joint pain Hematologic: No bleeding or bruising Psychiatric: No history of depression or anxiety    Physical Exam:  Vital signs: BP (!) 150/90   Pulse 75   Ht 6' (1.829 m)   Wt 162 lb (73.5 kg)   SpO2 98%   BMI 21.97 kg/m   Constitutional: NAD, Well developed, Well nourished, alert and cooperative Head:  Normocephalic and atraumatic. Eyes:   PEERL, EOMI. No icterus. Conjunctiva pink. Respiratory: Respirations even  and unlabored. Lungs clear to auscultation bilaterally.   No wheezes, crackles, or rhonchi.  Cardiovascular:  Regular rate and rhythm. No peripheral edema, cyanosis or pallor.  Gastrointestinal:  Soft, nondistended, nontender. No rebound or guarding. Normal bowel sounds. No appreciable masses or hepatomegaly. Rectal:   Not performed.  Msk:  Symmetrical without gross deformities. Without edema, no deformity or joint abnormality.  Neurologic:  Alert and  oriented x4;  grossly normal neurologically.  Skin:   Dry and intact without significant lesions or rashes. Psychiatric: Oriented to person, place and time. Demonstrates good judgement and reason without abnormal affect or behaviors.   RELEVANT LABS AND IMAGING: CBC    Component Value Date/Time   WBC 6.7 01/28/2024 0630   RBC 3.92 (L) 01/28/2024 0630   HGB 11.9 (L) 01/28/2024 0630   HGB 13.4 06/04/2023 1151   HGB 12.1 (L) 08/20/2012 1100   HCT 35.7 (L) 01/28/2024 0630   HCT 40.9 06/04/2023 1151   HCT 37.7 (L) 08/20/2012 1100   PLT 126 (L) 01/28/2024 0630   PLT 125 (L) 06/04/2023 1151   MCV 91.1 01/28/2024 0630   MCV 92 06/04/2023 1151   MCV 88.5 08/20/2012 1100   MCH 30.4 01/28/2024 0630   MCHC 33.3 01/28/2024 0630   RDW 14.8 01/28/2024 0630   RDW 13.9 06/04/2023 1151   RDW 15.3 (H) 08/20/2012 1100   LYMPHSABS 2.2 01/09/2024 1038   LYMPHSABS 1.7 06/04/2023 1151   LYMPHSABS 2.1 08/20/2012 1100   MONOABS 0.5 01/09/2024 1038   MONOABS 0.4 08/20/2012 1100   EOSABS 0.1 01/09/2024 1038   EOSABS 0.1 06/04/2023 1151   BASOSABS 0.0 01/09/2024 1038   BASOSABS 0.0 06/04/2023 1151   BASOSABS 0.0 08/20/2012 1100    CMP     Component Value Date/Time   NA 136 01/28/2024 0630   NA 141 01/01/2023 1010   K 3.7 01/28/2024 0630   CL 105 01/28/2024 0630   CO2 22 01/28/2024 0630   GLUCOSE 94 01/28/2024 0630   BUN 5 (L) 01/28/2024 0630   BUN 12 01/01/2023 1010   CREATININE 0.75 01/28/2024 0630   CREATININE 0.74 04/22/2013 1156   CALCIUM 8.8 (L) 01/28/2024 0630   PROT 6.5 01/23/2024 0507   PROT 6.5 01/01/2023 1010   ALBUMIN 3.9 01/23/2024 0507   ALBUMIN 4.3 01/01/2023 1010   AST 27 01/23/2024 0507   ALT 12 01/23/2024 0507   ALKPHOS 81 01/23/2024 0507   BILITOT 1.1 01/23/2024 0507   BILITOT 0.5 01/01/2023 1010   GFRNONAA >60 01/28/2024 0630    GFRNONAA 87 04/22/2013 1156   GFRAA 96 01/06/2021 1457   GFRAA >89 04/22/2013 1156     Assessment/Plan:   Eructation GERD Longstanding history of eructation and GERD not currently on any antacid.  Gas pain if he cannot burp.  Worse with raw foods. - Recommend low FODMAP diet - Educated patient on GERD lifestyle modifications and provided patient education handouts - Trial of famotidine 40 Mg once daily - Follow-up 8 weeks  Constipation Flatulence CT abdomen pelvis 2022 with significantly large stool burden.  Flexible sigmoidoscopy in 2016 showing diverticulosis.  Recent CBC/CMP unrevealing.  Suspect worsening gas could also be from constipation as he is not on the regular bowel regimen, though he does have a bowel movement daily he still feels constipated. - Trial of Benefiber - If no improvement on Benefiber would add in MiraLAX 1 capful per day and adjust dose based on response - Recommend low FODMAP diet - Can continue to use  simethicone as needed   NSTEMI Recent admission with NSTEMI 01/2024 underwent LHC 01/28/2024 showing multivessel CAD.  No intervention performed.  Medical therapy recommended. Discharged on Plavix, Imdur, rosuvastatin.  Warfarin changed to Eliquis.  Following with [facility] health cardiology.   - With recent NSTEMI and extensive comorbidities as well as age any endoscopic intervention would be very high risk in this patient.  CAD s/p remote CABG  Indwelling pacemaker  Paroxysmal A-fib On Eliquis  Zariana Strub Jolee Ewing [facility] 03/26/2024, 11:14 AM  Cc: Raliegh Ip, DO

## 2024-03-27 ENCOUNTER — Encounter (HOSPITAL_COMMUNITY)
Admission: RE | Admit: 2024-03-27 | Discharge: 2024-03-27 | Disposition: A | Discharge status: Home / Self Care | Source: Ambulatory Visit | Attending: Cardiology | Admitting: Cardiology

## 2024-03-27 DIAGNOSIS — I214 Non-ST elevation (NSTEMI) myocardial infarction: Secondary | ICD-10-CM

## 2024-03-27 NOTE — Progress Notes (Signed)
 Daily Session Note  Patient Details  Name: Dalton Vaughan MRN: [id number] Date of Birth: 07/13/1929 Referring Provider:   Flowsheet Row CARDIAC REHAB PHASE II ORIENTATION from 02/18/2024 in [facility] CARDIAC REHABILITATION  Referring Provider Candelaria Stagers MD  [Primary Cardiologist: Dr. Norman Herrlich ([place])]       Encounter Date: 03/27/2024  Check In:  Session Check In - 03/27/24 1045       Check-In   Supervising physician immediately available to respond to emergencies See telemetry face sheet for immediately available MD    Location [facility]    Staff Present Rodena Medin, RN, BSN;Heather Fredric Mare, BS, Exercise Physiologist    Virtual Visit No    Medication changes reported     No    Fall or balance concerns reported    No    Warm-up and Cool-down Performed on first and last piece of equipment    Resistance Training Performed Yes    VAD Patient? No    PAD/SET Patient? No      Pain Assessment   Currently in Pain? No/denies    Pain Score 0-No pain    Multiple Pain Sites No             Capillary Blood Glucose: No results found for this or any previous visit (from the past 24 hours).    Social History   Tobacco Use  Smoking Status Former   Current packs/day: 0.00   Average packs/day: 1 pack/day for 18.0 years (18.0 ttl pk-yrs)   Types: Cigarettes   Start date: 12/17/1948   Quit date: 12/17/1966   Years since quitting: 57.3  Smokeless Tobacco Never    Goals Met:  Independence with exercise equipment Exercise tolerated well No report of concerns or symptoms today Strength training completed today  Goals Unmet:  Not Applicable  Comments: Pt able to follow exercise prescription today without complaint.  Will continue to monitor for progression.

## 2024-03-30 ENCOUNTER — Encounter (HOSPITAL_COMMUNITY)
Admission: RE | Admit: 2024-03-30 | Discharge: 2024-03-30 | Disposition: A | Discharge status: Home / Self Care | Source: Ambulatory Visit | Attending: Cardiology | Admitting: Cardiology

## 2024-03-30 DIAGNOSIS — M62830 Muscle spasm of back: Secondary | ICD-10-CM | POA: Diagnosis not present

## 2024-03-30 DIAGNOSIS — M9903 Segmental and somatic dysfunction of lumbar region: Secondary | ICD-10-CM | POA: Diagnosis not present

## 2024-03-30 DIAGNOSIS — M9902 Segmental and somatic dysfunction of thoracic region: Secondary | ICD-10-CM | POA: Diagnosis not present

## 2024-03-30 DIAGNOSIS — I214 Non-ST elevation (NSTEMI) myocardial infarction: Secondary | ICD-10-CM | POA: Diagnosis not present

## 2024-03-30 DIAGNOSIS — M9901 Segmental and somatic dysfunction of cervical region: Secondary | ICD-10-CM | POA: Diagnosis not present

## 2024-03-30 NOTE — Progress Notes (Signed)
 Daily Session Note  Patient Details  Name: Dalton Vaughan MRN: [id number] Date of Birth: 1929-06-08 Referring Provider:   Flowsheet Row CARDIAC REHAB PHASE II ORIENTATION from 02/18/2024 in [facility] CARDIAC REHABILITATION  Referring Provider Carry Clapper MD  [Primary Cardiologist: Dr. Zoe Hinds ([place])]       Encounter Date: 03/30/2024  Check In:  Session Check In - 03/30/24 1045       Check-In   Supervising physician immediately available to respond to emergencies See telemetry face sheet for immediately available ER MD    Location [facility]    Staff Present Doug Gehrig, RN, BSN;Heather Toy Freund, BS, Exercise Physiologist;Danny Claudean Crumbly, RN, BSN    Virtual Visit No    Medication changes reported     No    Fall or balance concerns reported    No    Warm-up and Cool-down Performed on first and last piece of equipment    Resistance Training Performed Yes    VAD Patient? No    PAD/SET Patient? No      Pain Assessment   Currently in Pain? No/denies    Pain Score 0-No pain    Multiple Pain Sites No             Capillary Blood Glucose: No results found for this or any previous visit (from the past 24 hours).    Social History   Tobacco Use  Smoking Status Former   Current packs/day: 0.00   Average packs/day: 1 pack/day for 18.0 years (18.0 ttl pk-yrs)   Types: Cigarettes   Start date: 12/17/1948   Quit date: 12/17/1966   Years since quitting: 57.3  Smokeless Tobacco Never    Goals Met:  Independence with exercise equipment Exercise tolerated well No report of concerns or symptoms today Strength training completed today  Goals Unmet:  Not Applicable  Comments: exercise

## 2024-04-01 ENCOUNTER — Encounter (HOSPITAL_COMMUNITY)
Admission: RE | Admit: 2024-04-01 | Discharge: 2024-04-01 | Disposition: A | Discharge status: Home / Self Care | Source: Ambulatory Visit | Attending: Cardiology | Admitting: Cardiology

## 2024-04-01 DIAGNOSIS — M9901 Segmental and somatic dysfunction of cervical region: Secondary | ICD-10-CM | POA: Diagnosis not present

## 2024-04-01 DIAGNOSIS — M9903 Segmental and somatic dysfunction of lumbar region: Secondary | ICD-10-CM | POA: Diagnosis not present

## 2024-04-01 DIAGNOSIS — I214 Non-ST elevation (NSTEMI) myocardial infarction: Secondary | ICD-10-CM

## 2024-04-01 DIAGNOSIS — M9902 Segmental and somatic dysfunction of thoracic region: Secondary | ICD-10-CM | POA: Diagnosis not present

## 2024-04-01 DIAGNOSIS — M62830 Muscle spasm of back: Secondary | ICD-10-CM | POA: Diagnosis not present

## 2024-04-01 NOTE — Progress Notes (Signed)
 Daily Session Note  Patient Details  Name: Dalton Vaughan MRN: [id number] Date of Birth: 11-11-1929 Referring Provider:   Flowsheet Row CARDIAC REHAB PHASE II ORIENTATION from 02/18/2024 in [facility] CARDIAC REHABILITATION  Referring Provider Carry Clapper MD  [Primary Cardiologist: Dr. Zoe Hinds ([place])]       Encounter Date: 04/01/2024  Check In:  Session Check In - 04/01/24 1042       Check-In   Supervising physician immediately available to respond to emergencies See telemetry face sheet for immediately available MD    Location [facility]    Staff Present Ronna Coho BSN, RN;Heather Toy Freund, [place], Exercise Physiologist;Debra Lincoln Renshaw, RN, BSN    Virtual Visit No    Medication changes reported     No    Fall or balance concerns reported    No    Tobacco Cessation No Change    Warm-up and Cool-down Performed on first and last piece of equipment    Resistance Training Performed Yes    VAD Patient? No    PAD/SET Patient? No      Pain Assessment   Currently in Pain? No/denies    Pain Score 0-No pain    Multiple Pain Sites No             Capillary Blood Glucose: No results found for this or any previous visit (from the past 24 hours).    Social History   Tobacco Use  Smoking Status Former   Current packs/day: 0.00   Average packs/day: 1 pack/day for 18.0 years (18.0 ttl pk-yrs)   Types: Cigarettes   Start date: 12/17/1948   Quit date: 12/17/1966   Years since quitting: 57.3  Smokeless Tobacco Never    Goals Met:  Independence with exercise equipment Exercise tolerated well No report of concerns or symptoms today Strength training completed today  Goals Unmet:  Not Applicable  Comments: Aaron AasAaron AasPt able to follow exercise prescription today without complaint.  Will continue to monitor for progression.

## 2024-04-02 DIAGNOSIS — M9902 Segmental and somatic dysfunction of thoracic region: Secondary | ICD-10-CM | POA: Diagnosis not present

## 2024-04-02 DIAGNOSIS — M9903 Segmental and somatic dysfunction of lumbar region: Secondary | ICD-10-CM | POA: Diagnosis not present

## 2024-04-02 DIAGNOSIS — M9901 Segmental and somatic dysfunction of cervical region: Secondary | ICD-10-CM | POA: Diagnosis not present

## 2024-04-02 DIAGNOSIS — M62830 Muscle spasm of back: Secondary | ICD-10-CM | POA: Diagnosis not present

## 2024-04-03 ENCOUNTER — Encounter (HOSPITAL_COMMUNITY)
Admission: RE | Admit: 2024-04-03 | Discharge: 2024-04-03 | Disposition: A | Discharge status: Home / Self Care | Source: Ambulatory Visit | Attending: Cardiology | Admitting: Cardiology

## 2024-04-03 DIAGNOSIS — I214 Non-ST elevation (NSTEMI) myocardial infarction: Secondary | ICD-10-CM

## 2024-04-03 NOTE — Progress Notes (Signed)
 Daily Session Note  Patient Details  Name: Dalton Vaughan MRN: [id number] Date of Birth: 1929-06-13 Referring Provider:   Flowsheet Row CARDIAC REHAB PHASE II ORIENTATION from 02/18/2024 in [facility] CARDIAC REHABILITATION  Referring Provider Carry Clapper MD  [Primary Cardiologist: Dr. Zoe Hinds ([place])]       Encounter Date: 04/03/2024  Check In:  Session Check In - 04/03/24 1053       Check-In   Supervising physician immediately available to respond to emergencies See telemetry face sheet for immediately available MD    Location [facility]    Staff Present Jerrol Morelle, BSN, RN, WTA-C;Heather Toy Freund, BS, Exercise Physiologist    Virtual Visit No    Medication changes reported     No    Fall or balance concerns reported    No    Tobacco Cessation No Change    Warm-up and Cool-down Performed on first and last piece of equipment    Resistance Training Performed Yes    VAD Patient? No    PAD/SET Patient? No      Pain Assessment   Currently in Pain? No/denies             Capillary Blood Glucose: No results found for this or any previous visit (from the past 24 hours).    Social History   Tobacco Use  Smoking Status Former   Current packs/day: 0.00   Average packs/day: 1 pack/day for 18.0 years (18.0 ttl pk-yrs)   Types: Cigarettes   Start date: 12/17/1948   Quit date: 12/17/1966   Years since quitting: 57.3  Smokeless Tobacco Never    Goals Met:  Independence with exercise equipment Exercise tolerated well No report of concerns or symptoms today Strength training completed today  Goals Unmet:  Not Applicable  Comments: Pt able to follow exercise prescription today without complaint.  Will continue to monitor for progression.

## 2024-04-06 ENCOUNTER — Encounter (HOSPITAL_COMMUNITY)
Admission: RE | Admit: 2024-04-06 | Discharge: 2024-04-06 | Disposition: A | Discharge status: Home / Self Care | Source: Ambulatory Visit | Attending: Cardiology | Admitting: Cardiology

## 2024-04-06 DIAGNOSIS — M62830 Muscle spasm of back: Secondary | ICD-10-CM | POA: Diagnosis not present

## 2024-04-06 DIAGNOSIS — M9902 Segmental and somatic dysfunction of thoracic region: Secondary | ICD-10-CM | POA: Diagnosis not present

## 2024-04-06 DIAGNOSIS — M9903 Segmental and somatic dysfunction of lumbar region: Secondary | ICD-10-CM | POA: Diagnosis not present

## 2024-04-06 DIAGNOSIS — I214 Non-ST elevation (NSTEMI) myocardial infarction: Secondary | ICD-10-CM | POA: Diagnosis not present

## 2024-04-06 DIAGNOSIS — M9901 Segmental and somatic dysfunction of cervical region: Secondary | ICD-10-CM | POA: Diagnosis not present

## 2024-04-06 NOTE — Progress Notes (Signed)
 Daily Session Note  Patient Details  Name: Per Beagley MRN: [id number] Date of Birth: [birth year] Referring Provider:   Flowsheet Row CARDIAC REHAB PHASE II ORIENTATION from 02/18/2024 in [facility] CARDIAC REHABILITATION  Referring Provider Carry Clapper MD  [Primary Cardiologist: Dr. Zoe Hinds ([place])]       Encounter Date: 04/06/2024  Check In:  Session Check In - 04/06/24 1051       Check-In   Supervising physician immediately available to respond to emergencies See telemetry face sheet for immediately available MD    Location [facility]    Staff Present Jerrol Morelle, BSN, RN, WTA-C;Heather Toy Freund, BS, Exercise Physiologist    Virtual Visit No    Medication changes reported     No    Fall or balance concerns reported    No    Tobacco Cessation No Change    Warm-up and Cool-down Performed on first and last piece of equipment    Resistance Training Performed Yes    VAD Patient? No    PAD/SET Patient? No      Pain Assessment   Currently in Pain? No/denies             Capillary Blood Glucose: No results found for this or any previous visit (from the past 24 hours).    Social History   Tobacco Use  Smoking Status Former   Current packs/day: 0.00   Average packs/day: 1 pack/day for 18.0 years (18.0 ttl pk-yrs)   Types: Cigarettes   Start date: 12/17/1948   Quit date: 12/17/1966   Years since quitting: 57.3  Smokeless Tobacco Never    Goals Met:  Independence with exercise equipment Exercise tolerated well No report of concerns or symptoms today Strength training completed today  Goals Unmet:  Not Applicable  Comments: Pt able to follow exercise prescription today without complaint.  Will continue to monitor for progression.

## 2024-04-08 ENCOUNTER — Encounter (HOSPITAL_COMMUNITY)
Admission: RE | Admit: 2024-04-08 | Discharge: 2024-04-08 | Disposition: A | Discharge status: Home / Self Care | Source: Ambulatory Visit | Attending: Cardiology | Admitting: Cardiology

## 2024-04-08 ENCOUNTER — Encounter (HOSPITAL_COMMUNITY): Payer: Self-pay | Admitting: *Deleted

## 2024-04-08 DIAGNOSIS — I214 Non-ST elevation (NSTEMI) myocardial infarction: Secondary | ICD-10-CM | POA: Diagnosis not present

## 2024-04-08 DIAGNOSIS — M9902 Segmental and somatic dysfunction of thoracic region: Secondary | ICD-10-CM | POA: Diagnosis not present

## 2024-04-08 DIAGNOSIS — M62830 Muscle spasm of back: Secondary | ICD-10-CM | POA: Diagnosis not present

## 2024-04-08 DIAGNOSIS — M9903 Segmental and somatic dysfunction of lumbar region: Secondary | ICD-10-CM | POA: Diagnosis not present

## 2024-04-08 DIAGNOSIS — M9901 Segmental and somatic dysfunction of cervical region: Secondary | ICD-10-CM | POA: Diagnosis not present

## 2024-04-08 NOTE — Progress Notes (Signed)
 Cardiac Individual Treatment Plan  Patient Details  Name: Dalton Vaughan MRN: [id number] Date of Birth: [birth year] Referring Provider:   Flowsheet Row CARDIAC REHAB PHASE II ORIENTATION from 02/18/2024 in [facility] CARDIAC REHABILITATION  Referring Provider Carry Clapper MD  [Primary Cardiologist: Dr. Zoe Hinds ([place])]       Initial Encounter Date:  Flowsheet Row CARDIAC REHAB PHASE II ORIENTATION from 02/18/2024 in [place] CARDIAC REHABILITATION  Date 02/18/24       Visit Diagnosis: NSTEMI (non-ST elevated myocardial infarction) [facility])  Patient's Home Medications on Admission:  Current Outpatient Medications:    apixaban  (ELIQUIS ) 5 MG TABS tablet, Take 1 tablet (5 mg total) by mouth 2 (two) times daily., Disp: 180 tablet, Rfl: 1   azelastine  (ASTELIN ) 0.1 % nasal spray, Place 1 spray into both nostrils 2 (two) times daily., Disp: 90 mL, Rfl: 4   clopidogrel  (PLAVIX ) 75 MG tablet, Take 1 tablet (75 mg total) by mouth daily., Disp: 90 tablet, Rfl: 2   doxazosin  (CARDURA ) 8 MG tablet, Take 1 tablet (8 mg total) by mouth at bedtime., Disp: 90 tablet, Rfl: 3   ezetimibe  (ZETIA ) 10 MG tablet, Take 1 tablet (10 mg total) by mouth daily., Disp: 90 tablet, Rfl: 2   famotidine  (PEPCID ) 40 MG tablet, Take 1 tablet (40 mg total) by mouth daily., Disp: 30 tablet, Rfl: 3   HYDROcodone -acetaminophen  (NORCO/VICODIN) 5-325 MG tablet, Take 1 tablet by mouth every 8 (eight) hours as needed for severe pain (pain score 7-10). (Patient not taking: Reported on 03/26/2024), Disp: 15 tablet, Rfl: 0   isosorbide  mononitrate (IMDUR ) 30 MG 24 hr tablet, Take 1 tablet (30 mg total) by mouth daily., Disp: 90 tablet, Rfl: 1   Lactobacillus (PROBIOTIC ACIDOPHILUS) CAPS, Take 1 capsule by mouth daily., Disp: , Rfl:    levothyroxine  (SYNTHROID ) 50 MCG tablet, Take 1 tablet (50 mcg total) by mouth daily before breakfast., Disp: 90 tablet, Rfl: 3   meclizine (ANTIVERT) 25 MG tablet, Take 25 mg by mouth daily as  needed for dizziness. (Patient not taking: Reported on 03/26/2024), Disp: , Rfl:    mirtazapine  (REMERON ) 15 MG tablet, Take 1 tablet (15 mg total) by mouth at bedtime., Disp: 100 tablet, Rfl: 3   Multiple Vitamin (MULTIVITAMIN) tablet, Take 1 tablet by mouth daily., Disp: , Rfl:    ramipril  (ALTACE ) 10 MG capsule, TAKE 1 CAPSULE DAILY, Disp: 90 capsule, Rfl: 3   rosuvastatin  (CRESTOR ) 10 MG tablet, Take 1 tablet (10 mg total) by mouth every other day., Disp: 90 tablet, Rfl: 0   SIMETHICONE  PO, Take 1 tablet by mouth as needed (gas)., Disp: , Rfl:    vitamin C (ASCORBIC ACID) 500 MG tablet, Take 500 mg by mouth daily. , Disp: , Rfl:   Past Medical History: Past Medical History:  Diagnosis Date   Anal fissure    Arthritis    Atrial fibrillation (HCC)    BPH (benign prostatic hypertrophy)    CAD (coronary artery disease)    Dr. Anastasia Balo   Cataract    COVID-19    Depression 2004   Diverticulosis    GERD (gastroesophageal reflux disease)    History of TIAs    Hyperlipidemia    Hypertension    Hypertensive heart disease without CHF    Hypothyroidism    Internal hemorrhoids    Lumbar disc disease    NSTEMI (non-ST elevated myocardial infarction) (HCC) 01/22/2024   Oral bleeding 08/20/2012   Following molar tooth extraction July, 2013  Pneumonia    Ruptured disk 1995   Shingles    Thyroid  disease    Vitamin D  deficiency    Vitreous detachment     Tobacco Use: Social History   Tobacco Use  Smoking Status Former   Current packs/day: 0.00   Average packs/day: 1 pack/day for 18.0 years (18.0 ttl pk-yrs)   Types: Cigarettes   Start date: 12/17/1948   Quit date: 12/17/1966   Years since quitting: 57.3  Smokeless Tobacco Never    Labs: Review Flowsheet  More data exists      Latest Ref Rng & Units 02/05/2019 02/22/2021 12/20/2021 01/01/2023 01/24/2024  Labs for ITP Cardiac and Pulmonary Rehab  Cholestrol 0 - 200 mg/dL 161  096  045  409  811   LDL (calc) 0 - 99 mg/dL 914  782  956   213  086   HDL-C >40 mg/dL 44  54  53  51  43   Trlycerides <150 mg/dL 68  74  78  82  32   Hemoglobin A1c 4.8 - 5.6 % 5.5  - - - -    Capillary Blood Glucose: No results found for: "GLUCAP"   Exercise Target Goals: Exercise Program Goal: Individual exercise prescription set using results from initial 6 min walk test and THRR while considering  patient's activity barriers and safety.   Exercise Prescription Goal: Starting with aerobic activity 30 plus minutes a day, 3 days per week for initial exercise prescription. Provide home exercise prescription and guidelines that participant acknowledges understanding prior to discharge.  Activity Barriers & Risk Stratification:  Activity Barriers & Cardiac Risk Stratification - 02/18/24 1327       Activity Barriers & Cardiac Risk Stratification   Activity Barriers Balance Concerns;Assistive Device;Deconditioning;Muscular Weakness;Arthritis;Shortness of Breath;Neck/Spine Problems    Cardiac Risk Stratification Moderate             6 Minute Walk:  6 Minute Walk     Row Name 02/18/24 1425         6 Minute Walk   Phase Initial     Distance 1070 feet     Walk Time 6 minutes     # of Rest Breaks 0     MPH 2.07     METS 1.78     RPE 12     VO2 Peak 6.25     Symptoms No     Resting HR 75 bpm     Resting BP 128/72     Resting Oxygen Saturation  97 %     Exercise Oxygen Saturation  during 6 min walk 98 %     Max Ex. HR 108 bpm     Max Ex. BP 144/74     2 Minute Post BP 124/62              Oxygen Initial Assessment:   Oxygen Re-Evaluation:   Oxygen Discharge (Final Oxygen Re-Evaluation):   Initial Exercise Prescription:  Initial Exercise Prescription - 02/18/24 1400       Date of Initial Exercise RX and Referring Provider   Date 02/18/24    Referring Provider Carry Clapper MD   Primary Cardiologist: Dr. Zoe Hinds ([place])     Oxygen   Maintain Oxygen Saturation 88% or higher      Treadmill   MPH 1.5     Grade 0.5    Minutes 15    METs 2.25      NuStep   Level 3  SPM 80    Minutes 15    METs 2.2      Prescription Details   Frequency (times per week) 3    Duration Progress to 30 minutes of continuous aerobic without signs/symptoms of physical distress      Intensity   THRR 40-80% of Max Heartrate 96-117    Ratings of Perceived Exertion 11-13    Perceived Dyspnea 0-4      Progression   Progression Continue to progress workloads to maintain intensity without signs/symptoms of physical distress.      Resistance Training   Training Prescription Yes    Weight 3 lb    Reps 10-15             Perform Capillary Blood Glucose checks as needed.  Exercise Prescription Changes:   Exercise Prescription Changes     Row Name 02/18/24 1400 03/04/24 1300 03/18/24 1300 04/03/24 1300       Response to Exercise   Blood Pressure (Admit) 128/72 122/80 120/60 142/60    Blood Pressure (Exercise) 144/74 138/80 -- --    Blood Pressure (Exit) 124/62 120/62 130/60 122/60    Heart Rate (Admit) 75 bpm 103 bpm 83 bpm 79 bpm    Heart Rate (Exercise) 108 bpm 113 bpm 108 bpm 105 bpm    Heart Rate (Exit) 78 bpm 102 bpm 86 bpm 86 bpm    Oxygen Saturation (Admit) 97 % -- -- --    Oxygen Saturation (Exercise) 98 % -- -- --    Rating of Perceived Exertion (Exercise) 12 12 13 13     Symptoms none -- -- --    Comments walk test results -- -- --    Duration -- Continue with 30 min of aerobic exercise without signs/symptoms of physical distress. Continue with 30 min of aerobic exercise without signs/symptoms of physical distress. Continue with 30 min of aerobic exercise without signs/symptoms of physical distress.    Intensity -- THRR unchanged THRR unchanged THRR unchanged      Progression   Progression -- Continue to progress workloads to maintain intensity without signs/symptoms of physical distress. Continue to progress workloads to maintain intensity without signs/symptoms of physical  distress. Continue to progress workloads to maintain intensity without signs/symptoms of physical distress.      Resistance Training   Training Prescription -- Yes Yes Yes    Weight -- 3 5 5     Reps -- 10-15 10-15 10-15      Treadmill   MPH -- 2 2.4 2.8    Grade -- 1 2.5 2.5    Minutes -- 15 15 15     METs -- 2.81 3.66 4.11      NuStep   Level -- 4 4 5     SPM -- 84 99 101    Minutes -- 15 15 15     METs -- 2.2 2.5 2.8             Exercise Comments:   Exercise Comments     Row Name 02/19/24 1027           Exercise Comments First full day of exercise!  Patient was oriented to gym and equipment including functions, settings, policies, and procedures.  Patient's individual exercise prescription and treatment plan were reviewed.  All starting workloads were established based on the results of the 6 minute walk test done at initial orientation visit.  The plan for exercise progression was also introduced and progression will be customized based on patient's performance and goals.  Exercise Goals and Review:   Exercise Goals     Row Name 02/18/24 1428             Exercise Goals   Increase Physical Activity Yes       Intervention Provide advice, education, support and counseling about physical activity/exercise needs.;Develop an individualized exercise prescription for aerobic and resistive training based on initial evaluation findings, risk stratification, comorbidities and participant's personal goals.       Expected Outcomes Short Term: Attend rehab on a regular basis to increase amount of physical activity.;Long Term: Add in home exercise to make exercise part of routine and to increase amount of physical activity.;Long Term: Exercising regularly at least 3-5 days a week.       Increase Strength and Stamina Yes       Intervention Develop an individualized exercise prescription for aerobic and resistive training based on initial evaluation findings,  risk stratification, comorbidities and participant's personal goals.;Provide advice, education, support and counseling about physical activity/exercise needs.       Expected Outcomes Short Term: Increase workloads from initial exercise prescription for resistance, speed, and METs.;Short Term: Perform resistance training exercises routinely during rehab and add in resistance training at home;Long Term: Improve cardiorespiratory fitness, muscular endurance and strength as measured by increased METs and functional capacity ( )       Able to understand and use rate of perceived exertion (RPE) scale Yes       Intervention Provide education and explanation on how to use RPE scale       Expected Outcomes Short Term: Able to use RPE daily in rehab to express subjective intensity level;Long Term:  Able to use RPE to guide intensity level when exercising independently       Able to understand and use Dyspnea scale Yes       Intervention Provide education and explanation on how to use Dyspnea scale       Expected Outcomes Short Term: Able to use Dyspnea scale daily in rehab to express subjective sense of shortness of breath during exertion;Long Term: Able to use Dyspnea scale to guide intensity level when exercising independently       Knowledge and understanding of Target Heart Rate Range (THRR) Yes       Intervention Provide education and explanation of THRR including how the numbers were predicted and where they are located for reference       Expected Outcomes Short Term: Able to state/look up THRR;Long Term: Able to use THRR to govern intensity when exercising independently;Short Term: Able to use daily as guideline for intensity in rehab       Able to check pulse independently Yes       Intervention Provide education and demonstration on how to check pulse in carotid and radial arteries.;Review the importance of being able to check your own pulse for safety during independent exercise       Expected  Outcomes Short Term: Able to explain why pulse checking is important during independent exercise;Long Term: Able to check pulse independently and accurately       Understanding of Exercise Prescription Yes       Intervention Provide education, explanation, and written materials on patient's individual exercise prescription       Expected Outcomes Short Term: Able to explain program exercise prescription;Long Term: Able to explain home exercise prescription to exercise independently                Exercise Goals Re-Evaluation :  Exercise Goals Re-Evaluation     Row Name 02/19/24 1028 03/25/24 1118           Exercise Goal Re-Evaluation   Exercise Goals Review Knowledge and understanding of Target Heart Rate Range (THRR);Able to understand and use rate of perceived exertion (RPE) scale Increase Physical Activity;Increase Strength and Stamina;Understanding of Exercise Prescription;Knowledge and understanding of Target Heart Rate Range (THRR);Able to understand and use rate of perceived exertion (RPE) scale      Comments Reviewed RPE and dyspnea scale, THR and program prescription with pt today.  Pt voiced understanding and was given a copy of goals to take home. Dijon states he is enjoying the exercise program here, he duplicates what he does here at home so he is staying pretty active.      Expected Outcomes Short: Use RPE daily to regulate intensity.  Long: Follow program prescription in THR Short: Continue to attend rehab. Long: Continue to exercise at home.                Discharge Exercise Prescription (Final Exercise Prescription Changes):  Exercise Prescription Changes - 04/03/24 1300       Response to Exercise   Blood Pressure (Admit) 142/60    Blood Pressure (Exit) 122/60    Heart Rate (Admit) 79 bpm    Heart Rate (Exercise) 105 bpm    Heart Rate (Exit) 86 bpm    Rating of Perceived Exertion (Exercise) 13    Duration Continue with 30 min of aerobic exercise without  signs/symptoms of physical distress.    Intensity THRR unchanged      Progression   Progression Continue to progress workloads to maintain intensity without signs/symptoms of physical distress.      Resistance Training   Training Prescription Yes    Weight 5    Reps 10-15      Treadmill   MPH 2.8    Grade 2.5    Minutes 15    METs 4.11      NuStep   Level 5    SPM 101    Minutes 15    METs 2.8             Nutrition:  Target Goals: Understanding of nutrition guidelines, daily intake of sodium 1500mg , cholesterol 200mg , calories 30% from fat and 7% or less from saturated fats, daily to have 5 or more servings of fruits and vegetables.  Biometrics:  Pre Biometrics - 02/18/24 1428       Pre Biometrics   Height 5' 10.25" (1.784 m)    Weight 165 lb 9.6 oz (75.1 kg)    Waist Circumference 34.5 inches    Hip Circumference 38 inches    Waist to Hip Ratio 0.91 %    BMI (Calculated) 23.6    Grip Strength 26.9 kg    Single Leg Stand 2.6 seconds              Nutrition Therapy Plan and Nutrition Goals:  Nutrition Therapy & Goals - 02/18/24 1325       Intervention Plan   Intervention Prescribe, educate and counsel regarding individualized specific dietary modifications aiming towards targeted core components such as weight, hypertension, lipid management, diabetes, heart failure and other comorbidities.;Nutrition handout(s) given to patient.    Expected Outcomes Short Term Goal: Understand basic principles of dietary content, such as calories, fat, sodium, cholesterol and nutrients.;Long Term Goal: Adherence to prescribed nutrition plan.             Nutrition  Assessments:  MEDIFICTS Score Key: >=70 Need to make dietary changes  40-70 Heart Healthy Diet <= 40 Therapeutic Level Cholesterol Diet  Flowsheet Row CARDIAC REHAB PHASE II ORIENTATION from 02/18/2024 in [facility] CARDIAC REHABILITATION  Picture Your Plate Total Score on Admission 73      Picture  Your Plate Scores: <16 Unhealthy dietary pattern with much room for improvement. 41-50 Dietary pattern unlikely to meet recommendations for good health and room for improvement. 51-60 More healthful dietary pattern, with some room for improvement.  >60 Healthy dietary pattern, although there may be some specific behaviors that could be improved.    Nutrition Goals Re-Evaluation:  Nutrition Goals Re-Evaluation     Row Name 03/25/24 1113             Goals   Nutrition Goal Continue eating healthy.       Comment Omarii states he eats pretty well for his age. He stays away from saturated fats, sugar and all the other bad things. Tries to eat grilled meats and fresh veggies.       Expected Outcome Short: Continue to eat healthy. Long: Continue to stay away from saturated fats and sugars.                Nutrition Goals Discharge (Final Nutrition Goals Re-Evaluation):  Nutrition Goals Re-Evaluation - 03/25/24 1113       Goals   Nutrition Goal Continue eating healthy.    Comment Desten states he eats pretty well for his age. He stays away from saturated fats, sugar and all the other bad things. Tries to eat grilled meats and fresh veggies.    Expected Outcome Short: Continue to eat healthy. Long: Continue to stay away from saturated fats and sugars.             Psychosocial: Target Goals: Acknowledge presence or absence of significant depression and/or stress, maximize coping skills, provide positive support system. Participant is able to verbalize types and ability to use techniques and skills needed for reducing stress and depression.  Initial Review & Psychosocial Screening:  Initial Psych Review & Screening - 02/18/24 1309       Initial Review   Current issues with Current Anxiety/Panic;Current Sleep Concerns;Current Stress Concerns    Source of Stress Concerns Financial;Chronic Illness;Family    Comments Worried about new onset of gas feeling/ financial issues with medical  bills. Lives with son and grandson, loss wife 4 years ago      Family Dynamics   Good Support System? Yes   Son and grandon   Comments Son and grandson live with the patient, wife passed away about 4 years ago.      Barriers   Psychosocial barriers to participate in program The patient should benefit from training in stress management and relaxation.;Psychosocial barriers identified (see note)      Screening Interventions   Interventions Encouraged to exercise;To provide support and resources with identified psychosocial needs;Provide feedback about the scores to participant    Expected Outcomes Short Term goal: Utilizing psychosocial counselor, staff and physician to assist with identification of specific Stressors or current issues interfering with healing process. Setting desired goal for each stressor or current issue identified.;Long Term Goal: Stressors or current issues are controlled or eliminated.;Short Term goal: Identification and review with participant of any Quality of Life or Depression concerns found by scoring the questionnaire.;Long Term goal: The participant improves quality of Life and PHQ9 Scores as seen by post scores and/or verbalization of changes  Quality of Life Scores:  Quality of Life - 02/18/24 1312       Quality of Life   Select Quality of Life      Quality of Life Scores   Health/Function Pre 23.29 %    Socioeconomic Pre 30 %    Psych/Spiritual Pre 27 %    Family Pre 25.5 %    GLOBAL Pre 25.67 %            Scores of 19 and below usually indicate a poorer quality of life in these areas.  A difference of  2-3 points is a clinically meaningful difference.  A difference of 2-3 points in the total score of the Quality of Life Index has been associated with significant improvement in overall quality of life, self-image, physical symptoms, and general health in studies assessing change in quality of life.  PHQ-9: Review Flowsheet  More data  exists      03/05/2024 02/18/2024 02/14/2024 02/03/2024 01/31/2024  Depression screen PHQ 2/9  Decreased Interest 0 0 0 0 0  Down, Depressed, Hopeless 0 0 0 0 0  PHQ - 2 Score 0 0 0 0 0  Altered sleeping 0 1 0 0 0  Tired, decreased energy 0 1 0 0 0  Change in appetite 0 0 0 0 0  Feeling bad or failure about yourself  0 1 0 0 0  Trouble concentrating 0 0 0 0 0  Moving slowly or fidgety/restless 0 0 0 0 0  Suicidal thoughts 0 0 0 0 0  PHQ-9 Score 0 3 0 0 0  Difficult doing work/chores Not difficult at all Not difficult at all Not difficult at all Not difficult at all Not difficult at all   Interpretation of Total Score  Total Score Depression Severity:  1-4 = Minimal depression, 5-9 = Mild depression, 10-14 = Moderate depression, 15-19 = Moderately severe depression, 20-27 = Severe depression   Psychosocial Evaluation and Intervention:  Psychosocial Evaluation - 02/18/24 1430       Psychosocial Evaluation & Interventions   Interventions Stress management education;Relaxation education;Encouraged to exercise with the program and follow exercise prescription    Comments Kalep is coming into cardiac rehab after a NSTEMI.  He had CABG and pacemaker placed in 1999.   Now his grafts are starting to occlude.  They decided to treat medically and he has not have any chest pain.  However, he has noted a "gassy" feeling that makes him feel dizzy.  He has mentioned it to doctor and they are watching it. He wants to know that his heart is okay and he can go and do as he wants like he did since his CABG.  He lives with his son and grandson.  His wife passed 4 years ago and he still struggles with that from time to time. He is worried about finances with all his recent medical bills and has not been sleeping as well since his event.  He does not perceive any barriers to attending rehab and opted for 3 days a week to finish program sooner.    Expected Outcomes Short: Attend rehab to improve stamina and confidence  in heart Long: Continue to build his confidence back up    Continue Psychosocial Services  Follow up required by staff             Psychosocial Re-Evaluation:  Psychosocial Re-Evaluation     Row Name 03/25/24 1112  Psychosocial Re-Evaluation   Current issues with Current Sleep Concerns       Comments Keyshaun says that he sleeps ok, he gets up every few hours having to use the bathroom but that has been an ongoing problem for years now.       Expected Outcomes Short: Continue to attend cardiac rehab. Long: Work on sleep routine and relaxation techniques.       Interventions Relaxation education;Stress management education;Encouraged to attend Cardiac Rehabilitation for the exercise       Continue Psychosocial Services  Follow up required by staff                Psychosocial Discharge (Final Psychosocial Re-Evaluation):  Psychosocial Re-Evaluation - 03/25/24 1112       Psychosocial Re-Evaluation   Current issues with Current Sleep Concerns    Comments Roy says that he sleeps ok, he gets up every few hours having to use the bathroom but that has been an ongoing problem for years now.    Expected Outcomes Short: Continue to attend cardiac rehab. Long: Work on sleep routine and relaxation techniques.    Interventions Relaxation education;Stress management education;Encouraged to attend Cardiac Rehabilitation for the exercise    Continue Psychosocial Services  Follow up required by staff             Vocational Rehabilitation: Provide vocational rehab assistance to qualifying candidates.   Vocational Rehab Evaluation & Intervention:  Vocational Rehab - 02/18/24 1437       Initial Vocational Rehab Evaluation & Intervention   Assessment shows need for Vocational Rehabilitation No   retired            Education: Education Goals: Education classes will be provided on a weekly basis, covering required topics. Participant will state understanding/return  demonstration of topics presented.  Learning Barriers/Preferences:  Learning Barriers/Preferences - 02/18/24 1312       Learning Barriers/Preferences   Learning Barriers Sight   Wears glasses   Learning Preferences Audio;Group Instruction;Individual Instruction;Pictoral;Video;Verbal Instruction;Written Material             Education Topics: Hypertension, Hypertension Reduction -Define heart disease and high blood pressure. Discus how high blood pressure affects the body and ways to reduce high blood pressure.   Exercise and Your Heart -Discuss why it is important to exercise, the FITT principles of exercise, normal and abnormal responses to exercise, and how to exercise safely. Flowsheet Row CARDIAC REHAB PHASE II EXERCISE from 04/01/2024 in [place] CARDIAC REHABILITATION  Date 04/01/24  Educator HB  Instruction Review Code 1- Verbalizes Understanding       Angina -Discuss definition of angina, causes of angina, treatment of angina, and how to decrease risk of having angina. Flowsheet Row CARDIAC REHAB PHASE II EXERCISE from 04/01/2024 in [place] CARDIAC REHABILITATION  Date 03/25/24  [tests/procedures]  Educator [facility]  Instruction Review Code 1- Verbalizes Understanding       Cardiac Medications -Review what the following cardiac medications are used for, how they affect the body, and side effects that may occur when taking the medications.  Medications include Aspirin , Beta blockers, calcium  channel blockers, ACE Inhibitors, angiotensin receptor blockers, diuretics, digoxin, and antihyperlipidemics. Flowsheet Row CARDIAC REHAB PHASE II EXERCISE from 04/01/2024 in [place] CARDIAC REHABILITATION  Date 03/11/24  Educator [facility]  Instruction Review Code 1- Verbalizes Understanding       Congestive Heart Failure -Discuss the definition of CHF, how to live with CHF, the signs and symptoms of CHF, and how keep  track of weight and sodium intake. Flowsheet Row CARDIAC  REHAB PHASE II EXERCISE from 04/01/2024 in [place] CARDIAC REHABILITATION  Date 03/04/24  Educator [facility]  Instruction Review Code 1- Verbalizes Understanding       Heart Disease and Intimacy -Discus the effect sexual activity has on the heart, how changes occur during intimacy as we age, and safety during sexual activity.   Smoking Cessation / COPD -Discuss different methods to quit smoking, the health benefits of quitting smoking, and the definition of COPD.   Nutrition I: Fats -Discuss the types of cholesterol, what cholesterol does to the heart, and how cholesterol levels can be controlled. Flowsheet Row CARDIAC REHAB PHASE II EXERCISE from 04/01/2024 in [place] CARDIAC REHABILITATION  Date 02/19/24  Educator [facility]  Instruction Review Code 1- Verbalizes Understanding       Nutrition II: Labels -Discuss the different components of food labels and how to read food label   Heart Parts/Heart Disease and PAD -Discuss the anatomy of the heart, the pathway of blood circulation through the heart, and these are affected by heart disease.   Stress I: Signs and Symptoms -Discuss the causes of stress, how stress may lead to anxiety and depression, and ways to limit stress. Flowsheet Row CARDIAC REHAB PHASE II EXERCISE from 04/01/2024 in [place] CARDIAC REHABILITATION  Date 02/26/24  Educator [facility]  Instruction Review Code 1- Verbalizes Understanding       Stress II: Relaxation -Discuss different types of relaxation techniques to limit stress.   Warning Signs of Stroke / TIA -Discuss definition of a stroke, what the signs and symptoms are of a stroke, and how to identify when someone is having stroke.   Knowledge Questionnaire Score:  Knowledge Questionnaire Score - 02/18/24 1436       Knowledge Questionnaire Score   Pre Score 23/24             Core Components/Risk Factors/Patient Goals at Admission:  Personal Goals and Risk Factors at Admission - 02/18/24 1437        Core Components/Risk Factors/Patient Goals on Admission    Weight Management Yes;Weight Maintenance    Intervention Weight Management: Develop a combined nutrition and exercise program designed to reach desired caloric intake, while maintaining appropriate intake of nutrient and fiber, sodium and fats, and appropriate energy expenditure required for the weight goal.;Weight Management: Provide education and appropriate resources to help participant work on and attain dietary goals.    Admit Weight 165 lb 9.6 oz (75.1 kg)    Goal Weight: Short Term 165 lb (74.8 kg)    Goal Weight: Long Term 165 lb (74.8 kg)    Expected Outcomes Short Term: Continue to assess and modify interventions until short term weight is achieved;Long Term: Adherence to nutrition and physical activity/exercise program aimed toward attainment of established weight goal;Weight Maintenance: Understanding of the daily nutrition guidelines, which includes 25-35% calories from fat, 7% or less cal from saturated fats, less than 200mg  cholesterol, less than 1.5gm of sodium, & 5 or more servings of fruits and vegetables daily    Hypertension Yes    Intervention Provide education on lifestyle modifcations including regular physical activity/exercise, weight management, moderate sodium restriction and increased consumption of fresh fruit, vegetables, and low fat dairy, alcohol moderation, and smoking cessation.;Monitor prescription use compliance.    Expected Outcomes Short Term: Continued assessment and intervention until BP is < 140/65mm HG in hypertensive participants. < 130/90mm HG in hypertensive participants with diabetes, heart failure or chronic kidney  disease.;Long Term: Maintenance of blood pressure at goal levels.    Lipids Yes    Intervention Provide education and support for participant on nutrition & aerobic/resistive exercise along with prescribed medications to achieve LDL 70mg , HDL >40mg .    Expected Outcomes Short  Term: Participant states understanding of desired cholesterol values and is compliant with medications prescribed. Participant is following exercise prescription and nutrition guidelines.;Long Term: Cholesterol controlled with medications as prescribed, with individualized exercise RX and with personalized nutrition plan. Value goals: LDL < 70mg , HDL > 40 mg.             Core Components/Risk Factors/Patient Goals Review:   Goals and Risk Factor Review     Row Name 03/25/24 1115             Core Components/Risk Factors/Patient Goals Review   Personal Goals Review Hypertension;Lipids       Review Jazon states that his BP is stable and takes his medications like he is supposed to. He is going to a chiropractor three times a week for his legs and back and he feels it is helping.       Expected Outcomes Short: Continue to attend rehab. Long: Continue to incorporate healthy ideas for the body like the chiropractor and stay on his medication regimen.                Core Components/Risk Factors/Patient Goals at Discharge (Final Review):   Goals and Risk Factor Review - 03/25/24 1115       Core Components/Risk Factors/Patient Goals Review   Personal Goals Review Hypertension;Lipids    Review Kailash states that his BP is stable and takes his medications like he is supposed to. He is going to a chiropractor three times a week for his legs and back and he feels it is helping.    Expected Outcomes Short: Continue to attend rehab. Long: Continue to incorporate healthy ideas for the body like the chiropractor and stay on his medication regimen.             ITP Comments:  ITP Comments     Row Name 02/18/24 1424 02/19/24 1027 03/11/24 0927 04/08/24 1008     ITP Comments Patient attend orientation today.  Patient is attending Cardiac Rehabilitation Program.  Documentation for diagnosis [facility] be found in [facility] 01/22/[facility].  Reviewed medical chart, RPE/RPD, gym safety, and program guidelines.  Patient  was fitted to equipment they will be using during rehab.  Patient is scheduled to start exercise on Wednesday 02/20/24 at 1100.   Initial ITP created and sent for review and signature by Dr. Armida Lander, Medical Director for Cardiac Rehabilitation Program. First full day of exercise!  Patient was oriented to gym and equipment including functions, settings, policies, and procedures.  Patient's individual exercise prescription and treatment plan were reviewed.  All starting workloads were established based on the results of the 6 minute walk test done at initial orientation visit.  The plan for exercise progression was also introduced and progression will be customized based on patient's performance and goals. 30 day review completed. ITP sent to Dr. Armida Lander, Medical Director of Cardiac Rehab. Continue with ITP unless changes are made by physician. 30 day review completed. ITP sent to Dr. Armida Lander, Medical Director of Cardiac Rehab. Continue with ITP unless changes are made by physician.             Comments: 30 day review

## 2024-04-08 NOTE — Progress Notes (Signed)
 Daily Session Note  Patient Details  Name: Dalton Vaughan MRN: [id number] Date of Birth: [birth year] Referring Provider:   Flowsheet Row CARDIAC REHAB PHASE II ORIENTATION from 02/18/2024 in [facility] CARDIAC REHABILITATION  Referring Provider Carry Clapper MD  [Primary Cardiologist: Dr. Zoe Hinds ([place])]       Encounter Date: 04/08/2024  Check In:  Session Check In - 04/08/24 1030       Check-In   Supervising physician immediately available to respond to emergencies See telemetry face sheet for immediately available MD    Location [facility]    Staff Present Ronna Coho BSN, RN;Heather Toy Freund, BS, Exercise Physiologist;Jessica [place], MA, RCEP, CCRP, CCET    Virtual Visit No    Medication changes reported     No    Fall or balance concerns reported    No    Tobacco Cessation No Change    Warm-up and Cool-down Performed on first and last piece of equipment    Resistance Training Performed Yes    VAD Patient? No    PAD/SET Patient? No      Pain Assessment   Currently in Pain? No/denies    Pain Score 0-No pain    Multiple Pain Sites No             Capillary Blood Glucose: No results found for this or any previous visit (from the past 24 hours).    Social History   Tobacco Use  Smoking Status Former   Current packs/day: 0.00   Average packs/day: 1 pack/day for 18.0 years (18.0 ttl pk-yrs)   Types: Cigarettes   Start date: 12/17/1948   Quit date: 12/17/1966   Years since quitting: 57.3  Smokeless Tobacco Never    Goals Met:  Independence with exercise equipment Exercise tolerated well No report of concerns or symptoms today Strength training completed today  Goals Unmet:  Not Applicable  Comments: Dalton AasAaron AasPt able to follow exercise prescription today without complaint.  Will continue to monitor for progression.

## 2024-04-10 ENCOUNTER — Encounter (HOSPITAL_COMMUNITY)
Admission: RE | Admit: 2024-04-10 | Discharge: 2024-04-10 | Disposition: A | Discharge status: Home / Self Care | Source: Ambulatory Visit | Attending: Cardiology | Admitting: Cardiology

## 2024-04-10 DIAGNOSIS — I214 Non-ST elevation (NSTEMI) myocardial infarction: Secondary | ICD-10-CM | POA: Diagnosis not present

## 2024-04-10 NOTE — Progress Notes (Signed)
 Daily Session Note  Patient Details  Name: Garland Hincapie MRN: [id number] Date of Birth: 03/31/1929 Referring Provider:   Flowsheet Row CARDIAC REHAB PHASE II ORIENTATION from 02/18/2024 in [facility] CARDIAC REHABILITATION  Referring Provider Carry Clapper MD  [Primary Cardiologist: Dr. Zoe Hinds ([place])]       Encounter Date: 04/10/2024  Check In:  Session Check In - 04/10/24 1045       Check-In   Supervising physician immediately available to respond to emergencies See telemetry face sheet for immediately available MD    Location [facility]    Staff Present Doug Gehrig, RN, BSN;Jaycee Pelzer, RN;Mary Sandee Crook, RN, BSN, MA    Virtual Visit No    Medication changes reported     No    Fall or balance concerns reported    No    Warm-up and Cool-down Performed on first and last piece of equipment    Resistance Training Performed Yes    VAD Patient? No    PAD/SET Patient? No      Pain Assessment   Currently in Pain? No/denies    Multiple Pain Sites No             Capillary Blood Glucose: No results found for this or any previous visit (from the past 24 hours).    Social History   Tobacco Use  Smoking Status Former   Current packs/day: 0.00   Average packs/day: 1 pack/day for 18.0 years (18.0 ttl pk-yrs)   Types: Cigarettes   Start date: 12/17/1948   Quit date: 12/17/1966   Years since quitting: 57.3  Smokeless Tobacco Never    Goals Met:  Independence with exercise equipment Exercise tolerated well Strength training completed today  Goals Unmet:  Not Applicable  Comments: Pt able to follow exercise prescription today without complaint.  Will continue to monitor for progression.

## 2024-04-13 ENCOUNTER — Encounter (HOSPITAL_COMMUNITY)
Admission: RE | Admit: 2024-04-13 | Discharge: 2024-04-13 | Disposition: A | Discharge status: Home / Self Care | Source: Ambulatory Visit | Attending: Cardiology | Admitting: Cardiology

## 2024-04-13 DIAGNOSIS — I214 Non-ST elevation (NSTEMI) myocardial infarction: Secondary | ICD-10-CM

## 2024-04-13 NOTE — Progress Notes (Signed)
 Daily Session Note  Patient Details  Name: Dalton Vaughan MRN: [id number] Date of Birth: [birth year] Referring Provider:   Flowsheet Row CARDIAC REHAB PHASE II ORIENTATION from 02/18/2024 in [facility] CARDIAC REHABILITATION  Referring Provider Carry Clapper MD  [Primary Cardiologist: Dr. Zoe Hinds ([place])]       Encounter Date: 04/13/2024  Check In:  Session Check In - 04/13/24 1100       Check-In   Supervising physician immediately available to respond to emergencies See telemetry face sheet for immediately available MD    Location [facility]    Staff Present Clotilda Danish, BS, Exercise Physiologist;Laureen Bevin Bucks, BS, RRT, CPFT;Brittany Foley, BSN, RN    Virtual Visit No    Medication changes reported     No    Fall or balance concerns reported    No    Tobacco Cessation No Change    Warm-up and Cool-down Performed on first and last piece of equipment    Resistance Training Performed Yes    VAD Patient? No    PAD/SET Patient? No      Pain Assessment   Currently in Pain? No/denies    Pain Score 0-No pain    Multiple Pain Sites No             Capillary Blood Glucose: No results found for this or any previous visit (from the past 24 hours).    Social History   Tobacco Use  Smoking Status Former   Current packs/day: 0.00   Average packs/day: 1 pack/day for 18.0 years (18.0 ttl pk-yrs)   Types: Cigarettes   Start date: 12/17/1948   Quit date: 12/17/1966   Years since quitting: 57.3  Smokeless Tobacco Never    Goals Met:  Independence with exercise equipment Exercise tolerated well No report of concerns or symptoms today Strength training completed today  Goals Unmet:  Not Applicable  Comments: Pt able to follow exercise prescription today without complaint.  Will continue to monitor for progression.

## 2024-04-14 DIAGNOSIS — H0102A Squamous blepharitis right eye, upper and lower eyelids: Secondary | ICD-10-CM | POA: Diagnosis not present

## 2024-04-14 DIAGNOSIS — Z961 Presence of intraocular lens: Secondary | ICD-10-CM | POA: Diagnosis not present

## 2024-04-14 DIAGNOSIS — H0102B Squamous blepharitis left eye, upper and lower eyelids: Secondary | ICD-10-CM | POA: Diagnosis not present

## 2024-04-14 DIAGNOSIS — H11823 Conjunctivochalasis, bilateral: Secondary | ICD-10-CM | POA: Diagnosis not present

## 2024-04-15 ENCOUNTER — Encounter (HOSPITAL_COMMUNITY)
Admission: RE | Admit: 2024-04-15 | Discharge: 2024-04-15 | Disposition: A | Discharge status: Home / Self Care | Source: Ambulatory Visit | Attending: Cardiology | Admitting: Cardiology

## 2024-04-15 VITALS — Ht 70.25 in | Wt 158.6 lb

## 2024-04-15 DIAGNOSIS — I214 Non-ST elevation (NSTEMI) myocardial infarction: Secondary | ICD-10-CM | POA: Diagnosis not present

## 2024-04-15 DIAGNOSIS — I495 Sick sinus syndrome: Secondary | ICD-10-CM | POA: Diagnosis not present

## 2024-04-15 DIAGNOSIS — Z950 Presence of cardiac pacemaker: Secondary | ICD-10-CM | POA: Diagnosis not present

## 2024-04-15 NOTE — Progress Notes (Signed)
 Daily Session Note  Patient Details  Name: Dalton Vaughan MRN: [id number] Date of Birth: 03/11/1929 Referring Provider:   Flowsheet Row CARDIAC REHAB PHASE II ORIENTATION from 02/18/2024 in [facility] CARDIAC REHABILITATION  Referring Provider Carry Clapper MD  [Primary Cardiologist: Dr. Zoe Hinds ([place])]       Encounter Date: 04/15/2024  Check In:  Session Check In - 04/15/24 1020       Check-In   Supervising physician immediately available to respond to emergencies See telemetry face sheet for immediately available MD    Location [facility]    Staff Present Rita Cherry, MA, RCEP, CCRP, Amador Bad, RN, Sylvester Evert, RN    Virtual Visit No    Medication changes reported     No    Fall or balance concerns reported    No    Warm-up and Cool-down Performed on first and last piece of equipment    Resistance Training Performed Yes    VAD Patient? No    PAD/SET Patient? No             Capillary Blood Glucose: No results found for this or any previous visit (from the past 24 hours).    Social History   Tobacco Use  Smoking Status Former   Current packs/day: 0.00   Average packs/day: 1 pack/day for 18.0 years (18.0 ttl pk-yrs)   Types: Cigarettes   Start date: 12/17/1948   Quit date: 12/17/1966   Years since quitting: 57.3  Smokeless Tobacco Never    Goals Met:  Independence with exercise equipment Exercise tolerated well No report of concerns or symptoms today Strength training completed today  Goals Unmet:  Not Applicable  Comments: Pt able to follow exercise prescription today without complaint.  Will continue to monitor for progression.

## 2024-04-15 NOTE — Patient Instructions (Signed)
 Discharge Patient Instructions  Patient Details  Name: Dalton Vaughan MRN: [id number] Date of Birth: 01-May-1929 Referring Provider:  Eliodoro Guerin, DO   Number of Visits: 36  Reason for Discharge:  Patient reached a stable level of exercise. Patient independent in their exercise. Patient has met program and personal goals.  Smoking History:  Social History   Tobacco Use  Smoking Status Former   Current packs/day: 0.00   Average packs/day: 1 pack/day for 18.0 years (18.0 ttl pk-yrs)   Types: Cigarettes   Start date: 12/17/1948   Quit date: 12/17/1966   Years since quitting: 57.3  Smokeless Tobacco Never    Diagnosis:  NSTEMI (non-ST elevated myocardial infarction) [facility])  Initial Exercise Prescription:  Initial Exercise Prescription - 02/18/24 1400       Date of Initial Exercise RX and Referring Provider   Date 02/18/24    Referring Provider Carry Clapper MD   Primary Cardiologist: Dr. Zoe Hinds ([place])     Oxygen   Maintain Oxygen Saturation 88% or higher      Treadmill   MPH 1.5    Grade 0.5    Minutes 15    METs 2.25      NuStep   Level 3    SPM 80    Minutes 15    METs 2.2      Prescription Details   Frequency (times per week) 3    Duration Progress to 30 minutes of continuous aerobic without signs/symptoms of physical distress      Intensity   THRR 40-80% of Max Heartrate 96-117    Ratings of Perceived Exertion 11-13    Perceived Dyspnea 0-4      Progression   Progression Continue to progress workloads to maintain intensity without signs/symptoms of physical distress.      Resistance Training   Training Prescription Yes    Weight 3 lb    Reps 10-15             Discharge Exercise Prescription (Final Exercise Prescription Changes):  Exercise Prescription Changes - 04/03/24 1300       Response to Exercise   Blood Pressure (Admit) 142/60    Blood Pressure (Exit) 122/60    Heart Rate (Admit) 79 bpm    Heart Rate (Exercise) 105  bpm    Heart Rate (Exit) 86 bpm    Rating of Perceived Exertion (Exercise) 13    Duration Continue with 30 min of aerobic exercise without signs/symptoms of physical distress.    Intensity THRR unchanged      Progression   Progression Continue to progress workloads to maintain intensity without signs/symptoms of physical distress.      Resistance Training   Training Prescription Yes    Weight 5    Reps 10-15      Treadmill   MPH 2.8    Grade 2.5    Minutes 15    METs 4.11      NuStep   Level 5    SPM 101    Minutes 15    METs 2.8             Functional Capacity:  6 Minute Walk     Row Name 02/18/24 1425 04/15/24 1200       6 Minute Walk   Phase Initial Discharge    Distance 1070 feet 1375 feet    Distance % Change -- 28.5 %    Distance Feet Change -- 305 ft    Walk Time  6 minutes 6 minutes    # of Rest Breaks 0 0    MPH 2.07 2.6    METS 1.78 2.07    RPE 12 13    VO2 Peak 6.25 7.25    Symptoms No Yes (comment)    Comments -- chronic knee pain 3/10    Resting HR 75 bpm 75 bpm    Resting BP 128/72 110/80    Resting Oxygen Saturation  97 % --    Exercise Oxygen Saturation  during 6 min walk 98 % --    Max Ex. HR 108 bpm 91 bpm    Max Ex. BP 144/74 126/74    2 Minute Post BP 124/62 --             Quality of Life:  Quality of Life - 02/18/24 1312       Quality of Life   Select Quality of Life      Quality of Life Scores   Health/Function Pre 23.29 %    Socioeconomic Pre 30 %    Psych/Spiritual Pre 27 %    Family Pre 25.5 %    GLOBAL Pre 25.67 %             Personal Goals: Goals established at orientation with interventions provided to work toward goal.    Exercise Goals Re-Evaluation: Nutrition & Weight - Outcomes:  Pre Biometrics - 02/18/24 1428       Pre Biometrics   Height 5' 10.25" (1.784 m)    Weight 75.1 kg    Waist Circumference 34.5 inches    Hip Circumference 38 inches    Waist to Hip Ratio 0.91 %    BMI  (Calculated) 23.6    Grip Strength 26.9 kg    Single Leg Stand 2.6 seconds             Post Biometrics - 04/15/24 1201        Post  Biometrics   Height 5' 10.25" (1.784 m)    Weight 71.9 kg    Waist Circumference 34.5 inches    Hip Circumference 37.5 inches    Waist to Hip Ratio 0.92 %    BMI (Calculated) 22.6    Grip Strength 24.9 kg    Single Leg Stand 30 seconds           Goals reviewed with patient; copy given to patient.

## 2024-04-16 DIAGNOSIS — M9901 Segmental and somatic dysfunction of cervical region: Secondary | ICD-10-CM | POA: Diagnosis not present

## 2024-04-16 DIAGNOSIS — M9903 Segmental and somatic dysfunction of lumbar region: Secondary | ICD-10-CM | POA: Diagnosis not present

## 2024-04-16 DIAGNOSIS — M62830 Muscle spasm of back: Secondary | ICD-10-CM | POA: Diagnosis not present

## 2024-04-16 DIAGNOSIS — M9902 Segmental and somatic dysfunction of thoracic region: Secondary | ICD-10-CM | POA: Diagnosis not present

## 2024-04-17 ENCOUNTER — Encounter (HOSPITAL_COMMUNITY)
Admission: RE | Admit: 2024-04-17 | Discharge: 2024-04-17 | Disposition: A | Discharge status: Home / Self Care | Source: Ambulatory Visit | Attending: Cardiology | Admitting: Cardiology

## 2024-04-17 DIAGNOSIS — I214 Non-ST elevation (NSTEMI) myocardial infarction: Secondary | ICD-10-CM | POA: Diagnosis not present

## 2024-04-17 NOTE — Progress Notes (Signed)
 Daily Session Note  Patient Details  Name: Sandi Zelenka MRN: [id number] Date of Birth: 11/07/1929 Referring Provider:   Flowsheet Row CARDIAC REHAB PHASE II ORIENTATION from 02/18/2024 in [facility] CARDIAC REHABILITATION  Referring Provider Carry Clapper MD  [Primary Cardiologist: Dr. Zoe Hinds ([place])]       Encounter Date: 04/17/2024  Check In:  Session Check In - 04/17/24 1045       Check-In   Supervising physician immediately available to respond to emergencies See telemetry face sheet for immediately available MD    Location [facility]    Staff Present Doug Gehrig, RN, BSN;Heather Toy Freund, BS, Exercise Physiologist;Laureen Bevin Bucks, BS, RRT, CPFT    Virtual Visit No    Medication changes reported     No    Fall or balance concerns reported    No    Warm-up and Cool-down Performed on first and last piece of equipment    Resistance Training Performed Yes    VAD Patient? No    PAD/SET Patient? No      Pain Assessment   Currently in Pain? No/denies    Pain Score 0-No pain    Multiple Pain Sites No             Capillary Blood Glucose: No results found for this or any previous visit (from the past 24 hours).    Social History   Tobacco Use  Smoking Status Former   Current packs/day: 0.00   Average packs/day: 1 pack/day for 18.0 years (18.0 ttl pk-yrs)   Types: Cigarettes   Start date: 12/17/1948   Quit date: 12/17/1966   Years since quitting: 57.3  Smokeless Tobacco Never    Goals Met:  Independence with exercise equipment Exercise tolerated well No report of concerns or symptoms today Strength training completed today  Goals Unmet:  Not Applicable  Comments: Pt able to follow exercise prescription today without complaint.  Will continue to monitor for progression.[name]

## 2024-04-19 NOTE — Progress Notes (Signed)
 Addendum: Reviewed and agree with assessment and management plan. Asha Grumbine, Carie Caddy, MD

## 2024-04-20 ENCOUNTER — Encounter (HOSPITAL_COMMUNITY)
Admission: RE | Admit: 2024-04-20 | Discharge: 2024-04-20 | Disposition: A | Discharge status: Home / Self Care | Source: Ambulatory Visit | Attending: Cardiology | Admitting: Cardiology

## 2024-04-20 DIAGNOSIS — I214 Non-ST elevation (NSTEMI) myocardial infarction: Secondary | ICD-10-CM

## 2024-04-20 NOTE — Progress Notes (Signed)
 Daily Session Note  Patient Details  Name: Dalton Vaughan MRN: [id number] Date of Birth: [birth year] Referring Provider:   Flowsheet Row CARDIAC REHAB PHASE II ORIENTATION from 02/18/2024 in [facility] CARDIAC REHABILITATION  Referring Provider Carry Clapper MD  [Primary Cardiologist: Dr. Zoe Hinds ([place])]       Encounter Date: 04/20/2024  Check In:  Session Check In - 04/20/24 1056       Check-In   Supervising physician immediately available to respond to emergencies See telemetry face sheet for immediately available MD    Location [facility]    Staff Present Doug Gehrig, RN, BSN;Heather Toy Freund, BS, Exercise Physiologist;Jessica Zoila Hines, MA, RCEP, CCRP, CCET    Virtual Visit No    Medication changes reported     No    Fall or balance concerns reported    No    Warm-up and Cool-down Performed on first and last piece of equipment    Resistance Training Performed Yes    VAD Patient? No    PAD/SET Patient? No      Pain Assessment   Currently in Pain? No/denies    Pain Score 0-No pain    Multiple Pain Sites No             Capillary Blood Glucose: No results found for this or any previous visit (from the past 24 hours).    Social History   Tobacco Use  Smoking Status Former   Current packs/day: 0.00   Average packs/day: 1 pack/day for 18.0 years (18.0 ttl pk-yrs)   Types: Cigarettes   Start date: 12/17/1948   Quit date: 12/17/1966   Years since quitting: 57.3  Smokeless Tobacco Never    Goals Met:  Independence with exercise equipment Exercise tolerated well No report of concerns or symptoms today Strength training completed today  Goals Unmet:  Not Applicable  Comments:  Dalton Vaughan graduated today from  rehab with 36 sessions completed.  Details of the patient's exercise prescription and what He needs to do in order to continue the prescription and progress were discussed with patient.  Patient was given a copy of prescription and goals.  Patient  verbalized understanding. Dalton Vaughan plans to continue to exercise by at home on is exercise equipment.

## 2024-04-20 NOTE — Progress Notes (Signed)
 Discharge Progress Report  Patient Details  Name: Dalton Vaughan MRN: [id number] Date of Birth: [birth year] Referring Provider:   Flowsheet Row CARDIAC REHAB PHASE II ORIENTATION from 02/18/2024 in [facility] CARDIAC REHABILITATION  Referring Provider Carry Clapper MD  [Primary Cardiologist: Dr. Zoe Hinds ([place])]        Number of Visits: 36  Reason for Discharge:  Patient reached a stable level of exercise. Patient independent in their exercise. Patient has met program and personal goals.  Smoking History:  Social History   Tobacco Use  Smoking Status Former   Current packs/day: 0.00   Average packs/day: 1 pack/day for 18.0 years (18.0 ttl pk-yrs)   Types: Cigarettes   Start date: 12/17/1948   Quit date: 12/17/1966   Years since quitting: 57.3  Smokeless Tobacco Never    Diagnosis:  NSTEMI (non-ST elevated myocardial infarction) [facility])    Initial Exercise Prescription:  Initial Exercise Prescription - 02/18/24 1400       Date of Initial Exercise RX and Referring Provider   Date 02/18/24    Referring Provider Carry Clapper MD   Primary Cardiologist: Dr. Zoe Hinds ([place])     Oxygen   Maintain Oxygen Saturation 88% or higher      Treadmill   MPH 1.5    Grade 0.5    Minutes 15    METs 2.25      NuStep   Level 3    SPM 80    Minutes 15    METs 2.2      Prescription Details   Frequency (times per week) 3    Duration Progress to 30 minutes of continuous aerobic without signs/symptoms of physical distress      Intensity   THRR 40-80% of Max Heartrate 96-117    Ratings of Perceived Exertion 11-13    Perceived Dyspnea 0-4      Progression   Progression Continue to progress workloads to maintain intensity without signs/symptoms of physical distress.      Resistance Training   Training Prescription Yes    Weight 3 lb    Reps 10-15             Discharge Exercise Prescription (Final Exercise Prescription Changes):  Exercise Prescription  Changes - 04/03/24 1300       Response to Exercise   Blood Pressure (Admit) 142/60    Blood Pressure (Exit) 122/60    Heart Rate (Admit) 79 bpm    Heart Rate (Exercise) 105 bpm    Heart Rate (Exit) 86 bpm    Rating of Perceived Exertion (Exercise) 13    Duration Continue with 30 min of aerobic exercise without signs/symptoms of physical distress.    Intensity THRR unchanged      Progression   Progression Continue to progress workloads to maintain intensity without signs/symptoms of physical distress.      Resistance Training   Training Prescription Yes    Weight 5    Reps 10-15      Treadmill   MPH 2.8    Grade 2.5    Minutes 15    METs 4.11      NuStep   Level 5    SPM 101    Minutes 15    METs 2.8             Functional Capacity:  6 Minute Walk     Row Name 02/18/24 1425 04/15/24 1200       6 Minute Walk   Phase Initial  Discharge    Distance 1070 feet 1375 feet    Distance % Change -- 28.5 %    Distance Feet Change -- 305 ft    Walk Time 6 minutes 6 minutes    # of Rest Breaks 0 0    MPH 2.07 2.6    METS 1.78 2.07    RPE 12 13    VO2 Peak 6.25 7.25    Symptoms No Yes (comment)    Comments -- chronic knee pain 3/10    Resting HR 75 bpm 75 bpm    Resting BP 128/72 110/80    Resting Oxygen Saturation  97 % --    Exercise Oxygen Saturation  during 6 min walk 98 % --    Max Ex. HR 108 bpm 91 bpm    Max Ex. BP 144/74 126/74    2 Minute Post BP 124/62 --             Psychological, QOL, Others - Outcomes: PHQ 2/9:    04/20/2024    3:46 PM 03/05/2024   12:43 PM 02/18/2024    1:20 PM 02/14/2024   10:06 AM 02/03/2024    9:51 AM  Depression screen PHQ 2/9  Decreased Interest 0 0 0 0 0  Down, Depressed, Hopeless 0 0 0 0 0  PHQ - 2 Score 0 0 0 0 0  Altered sleeping 0 0 1 0 0  Tired, decreased energy 0 0 1 0 0  Change in appetite 0 0 0 0 0  Feeling bad or failure about yourself  0 0 1 0 0  Trouble concentrating 0 0 0 0 0  Moving slowly or  fidgety/restless 0 0 0 0 0  Suicidal thoughts 0 0 0 0 0  PHQ-9 Score 0 0 3 0 0  Difficult doing work/chores Not difficult at all Not difficult at all Not difficult at all Not difficult at all Not difficult at all    Quality of Life:  Quality of Life - 04/20/24 1545       Quality of Life   Select Quality of Life      Quality of Life Scores   Health/Function Pre 23.29 %    Health/Function Post 18.97 %    Health/Function % Change -18.55 %    Socioeconomic Pre 30 %    Socioeconomic Post 29.17 %    Socioeconomic % Change  -2.77 %    Psych/Spiritual Pre 27 %    Psych/Spiritual Post 25 %    Psych/Spiritual % Change -7.41 %    Family Pre 25.5 %    Family Post 27 %    Family % Change 5.88 %    GLOBAL Pre 25.67 %    GLOBAL Post 23.2 %    GLOBAL % Change -9.62 %                Nutrition & Weight - Outcomes:  Pre Biometrics - 02/18/24 1428       Pre Biometrics   Height 5' 10.25" (1.784 m)    Weight 75.1 kg    Waist Circumference 34.5 inches    Hip Circumference 38 inches    Waist to Hip Ratio 0.91 %    BMI (Calculated) 23.6    Grip Strength 26.9 kg    Single Leg Stand 2.6 seconds             Post Biometrics - 04/15/24 1201        Post  Biometrics  Height 5' 10.25" (1.784 m)    Weight 71.9 kg    Waist Circumference 34.5 inches    Hip Circumference 37.5 inches    Waist to Hip Ratio 0.92 %    BMI (Calculated) 22.6    Grip Strength 24.9 kg    Single Leg Stand 30 seconds           Education Questionnaire Score:  Knowledge Questionnaire Score - 04/20/24 1545       Knowledge Questionnaire Score   Pre Score 23/24    Post Score 24/24

## 2024-04-20 NOTE — Progress Notes (Signed)
 Cardiac Individual Treatment Plan  Patient Details  Name: Dalton Vaughan MRN: [id number] Date of Birth: Jun 18, 1929 Referring Provider:   Flowsheet Row CARDIAC REHAB PHASE II ORIENTATION from 02/18/2024 in [facility] CARDIAC REHABILITATION  Referring Provider Carry Clapper MD  [Primary Cardiologist: Dr. Zoe Hinds ([place])]       Initial Encounter Date:  Flowsheet Row CARDIAC REHAB PHASE II ORIENTATION from 02/18/2024 in [place] CARDIAC REHABILITATION  Date 02/18/24       Visit Diagnosis: NSTEMI (non-ST elevated myocardial infarction) [facility])  Patient's Home Medications on Admission:  Current Outpatient Medications:    apixaban  (ELIQUIS ) 5 MG TABS tablet, Take 1 tablet (5 mg total) by mouth 2 (two) times daily., Disp: 180 tablet, Rfl: 1   azelastine  (ASTELIN ) 0.1 % nasal spray, Place 1 spray into both nostrils 2 (two) times daily., Disp: 90 mL, Rfl: 4   clopidogrel  (PLAVIX ) 75 MG tablet, Take 1 tablet (75 mg total) by mouth daily., Disp: 90 tablet, Rfl: 2   doxazosin  (CARDURA ) 8 MG tablet, Take 1 tablet (8 mg total) by mouth at bedtime., Disp: 90 tablet, Rfl: 3   ezetimibe  (ZETIA ) 10 MG tablet, Take 1 tablet (10 mg total) by mouth daily., Disp: 90 tablet, Rfl: 2   famotidine  (PEPCID ) 40 MG tablet, Take 1 tablet (40 mg total) by mouth daily., Disp: 30 tablet, Rfl: 3   HYDROcodone -acetaminophen  (NORCO/VICODIN) 5-325 MG tablet, Take 1 tablet by mouth every 8 (eight) hours as needed for severe pain (pain score 7-10). (Patient not taking: Reported on 03/26/2024), Disp: 15 tablet, Rfl: 0   isosorbide  mononitrate (IMDUR ) 30 MG 24 hr tablet, Take 1 tablet (30 mg total) by mouth daily., Disp: 90 tablet, Rfl: 1   Lactobacillus (PROBIOTIC ACIDOPHILUS) CAPS, Take 1 capsule by mouth daily., Disp: , Rfl:    levothyroxine  (SYNTHROID ) 50 MCG tablet, Take 1 tablet (50 mcg total) by mouth daily before breakfast., Disp: 90 tablet, Rfl: 3   meclizine (ANTIVERT) 25 MG tablet, Take 25 mg by mouth daily as  needed for dizziness. (Patient not taking: Reported on 03/26/2024), Disp: , Rfl:    mirtazapine  (REMERON ) 15 MG tablet, Take 1 tablet (15 mg total) by mouth at bedtime., Disp: 100 tablet, Rfl: 3   Multiple Vitamin (MULTIVITAMIN) tablet, Take 1 tablet by mouth daily., Disp: , Rfl:    ramipril  (ALTACE ) 10 MG capsule, TAKE 1 CAPSULE DAILY, Disp: 90 capsule, Rfl: 3   rosuvastatin  (CRESTOR ) 10 MG tablet, Take 1 tablet (10 mg total) by mouth every other day., Disp: 90 tablet, Rfl: 0   SIMETHICONE  PO, Take 1 tablet by mouth as needed (gas)., Disp: , Rfl:    vitamin C (ASCORBIC ACID) 500 MG tablet, Take 500 mg by mouth daily. , Disp: , Rfl:   Past Medical History: Past Medical History:  Diagnosis Date   Anal fissure    Arthritis    Atrial fibrillation (HCC)    BPH (benign prostatic hypertrophy)    CAD (coronary artery disease)    Dr. Anastasia Balo   Cataract    COVID-19    Depression 2004   Diverticulosis    GERD (gastroesophageal reflux disease)    History of TIAs    Hyperlipidemia    Hypertension    Hypertensive heart disease without CHF    Hypothyroidism    Internal hemorrhoids    Lumbar disc disease    NSTEMI (non-ST elevated myocardial infarction) (HCC) 01/22/2024   Oral bleeding 08/20/2012   Following molar tooth extraction July, 2013  Pneumonia    Ruptured disk 1995   Shingles    Thyroid  disease    Vitamin D  deficiency    Vitreous detachment     Tobacco Use: Social History   Tobacco Use  Smoking Status Former   Current packs/day: 0.00   Average packs/day: 1 pack/day for 18.0 years (18.0 ttl pk-yrs)   Types: Cigarettes   Start date: 12/17/1948   Quit date: 12/17/1966   Years since quitting: 57.3  Smokeless Tobacco Never    Labs: Review Flowsheet  More data exists      Latest Ref Rng & Units 02/05/2019 02/22/2021 12/20/2021 01/01/2023 01/24/2024  Labs for ITP Cardiac and Pulmonary Rehab  Cholestrol 0 - 200 mg/dL 161  096  045  409  811   LDL (calc) 0 - 99 mg/dL 914  782  956   213  086   HDL-C >40 mg/dL 44  54  53  51  43   Trlycerides <150 mg/dL 68  74  78  82  32   Hemoglobin A1c 4.8 - 5.6 % 5.5  - - - -    Capillary Blood Glucose: No results found for: "GLUCAP"   Exercise Target Goals: Exercise Program Goal: Individual exercise prescription set using results from initial 6 min walk test and THRR while considering  patient's activity barriers and safety.   Exercise Prescription Goal: Starting with aerobic activity 30 plus minutes a day, 3 days per week for initial exercise prescription. Provide home exercise prescription and guidelines that participant acknowledges understanding prior to discharge.  Activity Barriers & Risk Stratification:  Activity Barriers & Cardiac Risk Stratification - 02/18/24 1327       Activity Barriers & Cardiac Risk Stratification   Activity Barriers Balance Concerns;Assistive Device;Deconditioning;Muscular Weakness;Arthritis;Shortness of Breath;Neck/Spine Problems    Cardiac Risk Stratification Moderate             6 Minute Walk:  6 Minute Walk     Row Name 02/18/24 1425 04/15/24 1200       6 Minute Walk   Phase Initial Discharge    Distance 1070 feet 1375 feet    Distance % Change -- 28.5 %    Distance Feet Change -- 305 ft    Walk Time 6 minutes 6 minutes    # of Rest Breaks 0 0    MPH 2.07 2.6    METS 1.78 2.07    RPE 12 13    VO2 Peak 6.25 7.25    Symptoms No Yes (comment)    Comments -- chronic knee pain 3/10    Resting HR 75 bpm 75 bpm    Resting BP 128/72 110/80    Resting Oxygen Saturation  97 % --    Exercise Oxygen Saturation  during 6 min walk 98 % --    Max Ex. HR 108 bpm 91 bpm    Max Ex. BP 144/74 126/74    2 Minute Post BP 124/62 --             Oxygen Initial Assessment:   Oxygen Re-Evaluation:   Oxygen Discharge (Final Oxygen Re-Evaluation):   Initial Exercise Prescription:  Initial Exercise Prescription - 02/18/24 1400       Date of Initial Exercise RX and Referring  Provider   Date 02/18/24    Referring Provider Carry Clapper MD   Primary Cardiologist: Dr. Zoe Hinds ([place])     Oxygen   Maintain Oxygen Saturation 88% or higher  Treadmill   MPH 1.5    Grade 0.5    Minutes 15    METs 2.25      NuStep   Level 3    SPM 80    Minutes 15    METs 2.2      Prescription Details   Frequency (times per week) 3    Duration Progress to 30 minutes of continuous aerobic without signs/symptoms of physical distress      Intensity   THRR 40-80% of Max Heartrate 96-117    Ratings of Perceived Exertion 11-13    Perceived Dyspnea 0-4      Progression   Progression Continue to progress workloads to maintain intensity without signs/symptoms of physical distress.      Resistance Training   Training Prescription Yes    Weight 3 lb    Reps 10-15             Perform Capillary Blood Glucose checks as needed.  Exercise Prescription Changes:   Exercise Prescription Changes     Row Name 02/18/24 1400 03/04/24 1300 03/18/24 1300 04/03/24 1300       Response to Exercise   Blood Pressure (Admit) 128/72 122/80 120/60 142/60    Blood Pressure (Exercise) 144/74 138/80 -- --    Blood Pressure (Exit) 124/62 120/62 130/60 122/60    Heart Rate (Admit) 75 bpm 103 bpm 83 bpm 79 bpm    Heart Rate (Exercise) 108 bpm 113 bpm 108 bpm 105 bpm    Heart Rate (Exit) 78 bpm 102 bpm 86 bpm 86 bpm    Oxygen Saturation (Admit) 97 % -- -- --    Oxygen Saturation (Exercise) 98 % -- -- --    Rating of Perceived Exertion (Exercise) 12 12 13 13     Symptoms none -- -- --    Comments walk test results -- -- --    Duration -- Continue with 30 min of aerobic exercise without signs/symptoms of physical distress. Continue with 30 min of aerobic exercise without signs/symptoms of physical distress. Continue with 30 min of aerobic exercise without signs/symptoms of physical distress.    Intensity -- THRR unchanged THRR unchanged THRR unchanged      Progression    Progression -- Continue to progress workloads to maintain intensity without signs/symptoms of physical distress. Continue to progress workloads to maintain intensity without signs/symptoms of physical distress. Continue to progress workloads to maintain intensity without signs/symptoms of physical distress.      Resistance Training   Training Prescription -- Yes Yes Yes    Weight -- 3 5 5     Reps -- 10-15 10-15 10-15      Treadmill   MPH -- 2 2.4 2.8    Grade -- 1 2.5 2.5    Minutes -- 15 15 15     METs -- 2.81 3.66 4.11      NuStep   Level -- 4 4 5     SPM -- 84 99 101    Minutes -- 15 15 15     METs -- 2.2 2.5 2.8             Exercise Comments:   Exercise Comments     Row Name 02/19/24 1027 04/20/24 1545         Exercise Comments First full day of exercise!  Patient was oriented to gym and equipment including functions, settings, policies, and procedures.  Patient's individual exercise prescription and treatment plan were reviewed.  All starting workloads were established based on  the results of the 6 minute walk test done at initial orientation visit.  The plan for exercise progression was also introduced and progression will be customized based on patient's performance and goals. Dalton Vaughan graduated today from  rehab with 36 sessions completed.  Details of the patient's exercise prescription and what He needs to do in order to continue the prescription and progress were discussed with patient.  Patient was given a copy of prescription and goals.  Patient verbalized understanding. Dalton Vaughan plans to continue to exercise by at home on is exercise equipment.               Exercise Goals and Review:   Exercise Goals     Row Name 02/18/24 1428             Exercise Goals   Increase Physical Activity Yes       Intervention Provide advice, education, support and counseling about physical activity/exercise needs.;Develop an individualized exercise prescription for aerobic and  resistive training based on initial evaluation findings, risk stratification, comorbidities and participant's personal goals.       Expected Outcomes Short Term: Attend rehab on a regular basis to increase amount of physical activity.;Long Term: Add in home exercise to make exercise part of routine and to increase amount of physical activity.;Long Term: Exercising regularly at least 3-5 days a week.       Increase Strength and Stamina Yes       Intervention Develop an individualized exercise prescription for aerobic and resistive training based on initial evaluation findings, risk stratification, comorbidities and participant's personal goals.;Provide advice, education, support and counseling about physical activity/exercise needs.       Expected Outcomes Short Term: Increase workloads from initial exercise prescription for resistance, speed, and METs.;Short Term: Perform resistance training exercises routinely during rehab and add in resistance training at home;Long Term: Improve cardiorespiratory fitness, muscular endurance and strength as measured by increased METs and functional capacity ( )       Able to understand and use rate of perceived exertion (RPE) scale Yes       Intervention Provide education and explanation on how to use RPE scale       Expected Outcomes Short Term: Able to use RPE daily in rehab to express subjective intensity level;Long Term:  Able to use RPE to guide intensity level when exercising independently       Able to understand and use Dyspnea scale Yes       Intervention Provide education and explanation on how to use Dyspnea scale       Expected Outcomes Short Term: Able to use Dyspnea scale daily in rehab to express subjective sense of shortness of breath during exertion;Long Term: Able to use Dyspnea scale to guide intensity level when exercising independently       Knowledge and understanding of Target Heart Rate Range (THRR) Yes       Intervention Provide education and  explanation of THRR including how the numbers were predicted and where they are located for reference       Expected Outcomes Short Term: Able to state/look up THRR;Long Term: Able to use THRR to govern intensity when exercising independently;Short Term: Able to use daily as guideline for intensity in rehab       Able to check pulse independently Yes       Intervention Provide education and demonstration on how to check pulse in carotid and radial arteries.;Review the importance of being able to check your own pulse  for safety during independent exercise       Expected Outcomes Short Term: Able to explain why pulse checking is important during independent exercise;Long Term: Able to check pulse independently and accurately       Understanding of Exercise Prescription Yes       Intervention Provide education, explanation, and written materials on patient's individual exercise prescription       Expected Outcomes Short Term: Able to explain program exercise prescription;Long Term: Able to explain home exercise prescription to exercise independently                Exercise Goals Re-Evaluation :  Exercise Goals Re-Evaluation     Row Name 02/19/24 1028 03/25/24 1118           Exercise Goal Re-Evaluation   Exercise Goals Review Knowledge and understanding of Target Heart Rate Range (THRR);Able to understand and use rate of perceived exertion (RPE) scale Increase Physical Activity;Increase Strength and Stamina;Understanding of Exercise Prescription;Knowledge and understanding of Target Heart Rate Range (THRR);Able to understand and use rate of perceived exertion (RPE) scale      Comments Reviewed RPE and dyspnea scale, THR and program prescription with pt today.  Pt voiced understanding and was given a copy of goals to take home. Dalton Vaughan states he is enjoying the exercise program here, he duplicates what he does here at home so he is staying pretty active.      Expected Outcomes Short: Use RPE daily  to regulate intensity.  Long: Follow program prescription in THR Short: Continue to attend rehab. Long: Continue to exercise at home.                Discharge Exercise Prescription (Final Exercise Prescription Changes):  Exercise Prescription Changes - 04/03/24 1300       Response to Exercise   Blood Pressure (Admit) 142/60    Blood Pressure (Exit) 122/60    Heart Rate (Admit) 79 bpm    Heart Rate (Exercise) 105 bpm    Heart Rate (Exit) 86 bpm    Rating of Perceived Exertion (Exercise) 13    Duration Continue with 30 min of aerobic exercise without signs/symptoms of physical distress.    Intensity THRR unchanged      Progression   Progression Continue to progress workloads to maintain intensity without signs/symptoms of physical distress.      Resistance Training   Training Prescription Yes    Weight 5    Reps 10-15      Treadmill   MPH 2.8    Grade 2.5    Minutes 15    METs 4.11      NuStep   Level 5    SPM 101    Minutes 15    METs 2.8             Nutrition:  Target Goals: Understanding of nutrition guidelines, daily intake of sodium 1500mg , cholesterol 200mg , calories 30% from fat and 7% or less from saturated fats, daily to have 5 or more servings of fruits and vegetables.  Biometrics:  Pre Biometrics - 02/18/24 1428       Pre Biometrics   Height 5' 10.25" (1.784 m)    Weight 75.1 kg    Waist Circumference 34.5 inches    Hip Circumference 38 inches    Waist to Hip Ratio 0.91 %    BMI (Calculated) 23.6    Grip Strength 26.9 kg    Single Leg Stand 2.6 seconds  Post Biometrics - 04/15/24 1201        Post  Biometrics   Height 5' 10.25" (1.784 m)    Weight 71.9 kg    Waist Circumference 34.5 inches    Hip Circumference 37.5 inches    Waist to Hip Ratio 0.92 %    BMI (Calculated) 22.6    Grip Strength 24.9 kg    Single Leg Stand 30 seconds             Nutrition Therapy Plan and Nutrition Goals:  Nutrition Therapy &  Goals - 02/18/24 1325       Intervention Plan   Intervention Prescribe, educate and counsel regarding individualized specific dietary modifications aiming towards targeted core components such as weight, hypertension, lipid management, diabetes, heart failure and other comorbidities.;Nutrition handout(s) given to patient.    Expected Outcomes Short Term Goal: Understand basic principles of dietary content, such as calories, fat, sodium, cholesterol and nutrients.;Long Term Goal: Adherence to prescribed nutrition plan.             Nutrition Assessments:  MEDIFICTS Score Key: >=70 Need to make dietary changes  40-70 Heart Healthy Diet <= 40 Therapeutic Level Cholesterol Diet  Flowsheet Row CARDIAC REHAB PHASE II EXERCISE from 04/20/2024 in [facility] CARDIAC REHABILITATION  Picture Your Plate Total Score on Admission 73  Picture Your Plate Total Score on Discharge 76      Picture Your Plate Scores: <16 Unhealthy dietary pattern with much room for improvement. 41-50 Dietary pattern unlikely to meet recommendations for good health and room for improvement. 51-60 More healthful dietary pattern, with some room for improvement.  >60 Healthy dietary pattern, although there may be some specific behaviors that could be improved.    Nutrition Goals Re-Evaluation:  Nutrition Goals Re-Evaluation     Row Name 03/25/24 1113             Goals   Nutrition Goal Continue eating healthy.       Comment Jobie states he eats pretty well for his age. He stays away from saturated fats, sugar and all the other bad things. Tries to eat grilled meats and fresh veggies.       Expected Outcome Short: Continue to eat healthy. Long: Continue to stay away from saturated fats and sugars.                Nutrition Goals Discharge (Final Nutrition Goals Re-Evaluation):  Nutrition Goals Re-Evaluation - 03/25/24 1113       Goals   Nutrition Goal Continue eating healthy.    Comment Dalton Vaughan states he eats  pretty well for his age. He stays away from saturated fats, sugar and all the other bad things. Tries to eat grilled meats and fresh veggies.    Expected Outcome Short: Continue to eat healthy. Long: Continue to stay away from saturated fats and sugars.             Psychosocial: Target Goals: Acknowledge presence or absence of significant depression and/or stress, maximize coping skills, provide positive support system. Participant is able to verbalize types and ability to use techniques and skills needed for reducing stress and depression.  Initial Review & Psychosocial Screening:  Initial Psych Review & Screening - 02/18/24 1309       Initial Review   Current issues with Current Anxiety/Panic;Current Sleep Concerns;Current Stress Concerns    Source of Stress Concerns Financial;Chronic Illness;Family    Comments Worried about new onset of gas feeling/ financial issues with medical bills.  Lives with son and grandson, loss wife 4 years ago      Family Dynamics   Good Support System? Yes   Son and grandon   Comments Son and grandson live with the patient, wife passed away about 4 years ago.      Barriers   Psychosocial barriers to participate in program The patient should benefit from training in stress management and relaxation.;Psychosocial barriers identified (see note)      Screening Interventions   Interventions Encouraged to exercise;To provide support and resources with identified psychosocial needs;Provide feedback about the scores to participant    Expected Outcomes Short Term goal: Utilizing psychosocial counselor, staff and physician to assist with identification of specific Stressors or current issues interfering with healing process. Setting desired goal for each stressor or current issue identified.;Long Term Goal: Stressors or current issues are controlled or eliminated.;Short Term goal: Identification and review with participant of any Quality of Life or Depression  concerns found by scoring the questionnaire.;Long Term goal: The participant improves quality of Life and PHQ9 Scores as seen by post scores and/or verbalization of changes             Quality of Life Scores:  Quality of Life - 04/20/24 1545       Quality of Life   Select Quality of Life      Quality of Life Scores   Health/Function Pre 23.29 %    Health/Function Post 18.97 %    Health/Function % Change -18.55 %    Socioeconomic Pre 30 %    Socioeconomic Post 29.17 %    Socioeconomic % Change  -2.77 %    Psych/Spiritual Pre 27 %    Psych/Spiritual Post 25 %    Psych/Spiritual % Change -7.41 %    Family Pre 25.5 %    Family Post 27 %    Family % Change 5.88 %    GLOBAL Pre 25.67 %    GLOBAL Post 23.2 %    GLOBAL % Change -9.62 %            Scores of 19 and below usually indicate a poorer quality of life in these areas.  A difference of  2-3 points is a clinically meaningful difference.  A difference of 2-3 points in the total score of the Quality of Life Index has been associated with significant improvement in overall quality of life, self-image, physical symptoms, and general health in studies assessing change in quality of life.  PHQ-9: Review Flowsheet  More data exists      04/20/2024 03/05/2024 02/18/2024 02/14/2024 02/03/2024  Depression screen PHQ 2/9  Decreased Interest 0 0 0 0 0  Down, Depressed, Hopeless 0 0 0 0 0  PHQ - 2 Score 0 0 0 0 0  Altered sleeping 0 0 1 0 0  Tired, decreased energy 0 0 1 0 0  Change in appetite 0 0 0 0 0  Feeling bad or failure about yourself  0 0 1 0 0  Trouble concentrating 0 0 0 0 0  Moving slowly or fidgety/restless 0 0 0 0 0  Suicidal thoughts 0 0 0 0 0  PHQ-9 Score 0 0 3 0 0  Difficult doing work/chores Not difficult at all Not difficult at all Not difficult at all Not difficult at all Not difficult at all   Interpretation of Total Score  Total Score Depression Severity:  1-4 = Minimal depression, 5-9 = Mild depression,  10-14 = Moderate depression, 15-19 =  Moderately severe depression, 20-27 = Severe depression   Psychosocial Evaluation and Intervention:  Psychosocial Evaluation - 02/18/24 1430       Psychosocial Evaluation & Interventions   Interventions Stress management education;Relaxation education;Encouraged to exercise with the program and follow exercise prescription    Comments Dalton Vaughan is coming into cardiac rehab after a NSTEMI.  He had CABG and pacemaker placed in 1999.   Now his grafts are starting to occlude.  They decided to treat medically and he has not have any chest pain.  However, he has noted a "gassy" feeling that makes him feel dizzy.  He has mentioned it to doctor and they are watching it. He wants to know that his heart is okay and he can go and do as he wants like he did since his CABG.  He lives with his son and grandson.  His wife passed 4 years ago and he still struggles with that from time to time. He is worried about finances with all his recent medical bills and has not been sleeping as well since his event.  He does not perceive any barriers to attending rehab and opted for 3 days a week to finish program sooner.    Expected Outcomes Short: Attend rehab to improve stamina and confidence in heart Long: Continue to build his confidence back up    Continue Psychosocial Services  Follow up required by staff             Psychosocial Re-Evaluation:  Psychosocial Re-Evaluation     Row Name 03/25/24 1112             Psychosocial Re-Evaluation   Current issues with Current Sleep Concerns       Comments Dalton Vaughan says that he sleeps ok, he gets up every few hours having to use the bathroom but that has been an ongoing problem for years now.       Expected Outcomes Short: Continue to attend cardiac rehab. Long: Work on sleep routine and relaxation techniques.       Interventions Relaxation education;Stress management education;Encouraged to attend Cardiac Rehabilitation for the exercise        Continue Psychosocial Services  Follow up required by staff                Psychosocial Discharge (Final Psychosocial Re-Evaluation):  Psychosocial Re-Evaluation - 03/25/24 1112       Psychosocial Re-Evaluation   Current issues with Current Sleep Concerns    Comments Dalton Vaughan says that he sleeps ok, he gets up every few hours having to use the bathroom but that has been an ongoing problem for years now.    Expected Outcomes Short: Continue to attend cardiac rehab. Long: Work on sleep routine and relaxation techniques.    Interventions Relaxation education;Stress management education;Encouraged to attend Cardiac Rehabilitation for the exercise    Continue Psychosocial Services  Follow up required by staff             Vocational Rehabilitation: Provide vocational rehab assistance to qualifying candidates.   Vocational Rehab Evaluation & Intervention:  Vocational Rehab - 02/18/24 1437       Initial Vocational Rehab Evaluation & Intervention   Assessment shows need for Vocational Rehabilitation No   retired            Education: Education Goals: Education classes will be provided on a weekly basis, covering required topics. Participant will state understanding/return demonstration of topics presented.  Learning Barriers/Preferences:  Learning Barriers/Preferences - 02/18/24 1312  Learning Barriers/Preferences   Learning Barriers Sight   Wears glasses   Learning Preferences Audio;Group Instruction;Individual Instruction;Pictoral;Video;Verbal Instruction;Written Material             Education Topics: Hypertension, Hypertension Reduction -Define heart disease and high blood pressure. Discus how high blood pressure affects the body and ways to reduce high blood pressure.   Exercise and Your Heart -Discuss why it is important to exercise, the FITT principles of exercise, normal and abnormal responses to exercise, and how to exercise safely. Flowsheet Row  CARDIAC REHAB PHASE II EXERCISE from 04/08/2024 in [place] CARDIAC REHABILITATION  Date 04/01/24  Educator HB  Instruction Review Code 1- Verbalizes Understanding       Angina -Discuss definition of angina, causes of angina, treatment of angina, and how to decrease risk of having angina. Flowsheet Row CARDIAC REHAB PHASE II EXERCISE from 04/08/2024 in [place] CARDIAC REHABILITATION  Date 03/25/24  [tests/procedures]  Educator [facility]  Instruction Review Code 1- Verbalizes Understanding       Cardiac Medications -Review what the following cardiac medications are used for, how they affect the body, and side effects that may occur when taking the medications.  Medications include Aspirin , Beta blockers, calcium  channel blockers, ACE Inhibitors, angiotensin receptor blockers, diuretics, digoxin, and antihyperlipidemics. Flowsheet Row CARDIAC REHAB PHASE II EXERCISE from 04/08/2024 in [place] CARDIAC REHABILITATION  Date 03/11/24  Educator [facility]  Instruction Review Code 1- Verbalizes Understanding       Congestive Heart Failure -Discuss the definition of CHF, how to live with CHF, the signs and symptoms of CHF, and how keep track of weight and sodium intake. Flowsheet Row CARDIAC REHAB PHASE II EXERCISE from 04/08/2024 in [place] CARDIAC REHABILITATION  Date 03/04/24  Educator Hb  Instruction Review Code 1- Verbalizes Understanding       Heart Disease and Intimacy -Discus the effect sexual activity has on the heart, how changes occur during intimacy as we age, and safety during sexual activity. Flowsheet Row CARDIAC REHAB PHASE II EXERCISE from 04/08/2024 in [place] CARDIAC REHABILITATION  Date 04/08/24  Educator [facility]  Instruction Review Code 1- Verbalizes Understanding       Smoking Cessation / COPD -Discuss different methods to quit smoking, the health benefits of quitting smoking, and the definition of COPD.   Nutrition I: Fats -Discuss the types of cholesterol,  what cholesterol does to the heart, and how cholesterol levels can be controlled. Flowsheet Row CARDIAC REHAB PHASE II EXERCISE from 04/08/2024 in [place] CARDIAC REHABILITATION  Date 02/19/24  Educator jh  Instruction Review Code 1- Verbalizes Understanding       Nutrition II: Labels -Discuss the different components of food labels and how to read food label   Heart Parts/Heart Disease and PAD -Discuss the anatomy of the heart, the pathway of blood circulation through the heart, and these are affected by heart disease.   Stress I: Signs and Symptoms -Discuss the causes of stress, how stress may lead to anxiety and depression, and ways to limit stress. Flowsheet Row CARDIAC REHAB PHASE II EXERCISE from 04/08/2024 in [place] CARDIAC REHABILITATION  Date 02/26/24  Educator [facility]  Instruction Review Code 1- Verbalizes Understanding       Stress II: Relaxation -Discuss different types of relaxation techniques to limit stress.   Warning Signs of Stroke / TIA -Discuss definition of a stroke, what the signs and symptoms are of a stroke, and how to identify when someone is having stroke.   Knowledge Questionnaire  Score:  Knowledge Questionnaire Score - 04/20/24 1545       Knowledge Questionnaire Score   Pre Score 23/24    Post Score 24/24             Core Components/Risk Factors/Patient Goals at Admission:  Personal Goals and Risk Factors at Admission - 02/18/24 1437       Core Components/Risk Factors/Patient Goals on Admission    Weight Management Yes;Weight Maintenance    Intervention Weight Management: Develop a combined nutrition and exercise program designed to reach desired caloric intake, while maintaining appropriate intake of nutrient and fiber, sodium and fats, and appropriate energy expenditure required for the weight goal.;Weight Management: Provide education and appropriate resources to help participant work on and attain dietary goals.    Admit Weight  165 lb 9.6 oz (75.1 kg)    Goal Weight: Short Term 165 lb (74.8 kg)    Goal Weight: Long Term 165 lb (74.8 kg)    Expected Outcomes Short Term: Continue to assess and modify interventions until short term weight is achieved;Long Term: Adherence to nutrition and physical activity/exercise program aimed toward attainment of established weight goal;Weight Maintenance: Understanding of the daily nutrition guidelines, which includes 25-35% calories from fat, 7% or less cal from saturated fats, less than 200mg  cholesterol, less than 1.5gm of sodium, & 5 or more servings of fruits and vegetables daily    Hypertension Yes    Intervention Provide education on lifestyle modifcations including regular physical activity/exercise, weight management, moderate sodium restriction and increased consumption of fresh fruit, vegetables, and low fat dairy, alcohol moderation, and smoking cessation.;Monitor prescription use compliance.    Expected Outcomes Short Term: Continued assessment and intervention until BP is < 140/59mm HG in hypertensive participants. < 130/46mm HG in hypertensive participants with diabetes, heart failure or chronic kidney disease.;Long Term: Maintenance of blood pressure at goal levels.    Lipids Yes    Intervention Provide education and support for participant on nutrition & aerobic/resistive exercise along with prescribed medications to achieve LDL 70mg , HDL >40mg .    Expected Outcomes Short Term: Participant states understanding of desired cholesterol values and is compliant with medications prescribed. Participant is following exercise prescription and nutrition guidelines.;Long Term: Cholesterol controlled with medications as prescribed, with individualized exercise RX and with personalized nutrition plan. Value goals: LDL < 70mg , HDL > 40 mg.             Core Components/Risk Factors/Patient Goals Review:   Goals and Risk Factor Review     Row Name 03/25/24 1115             Core  Components/Risk Factors/Patient Goals Review   Personal Goals Review Hypertension;Lipids       Review Dalton Vaughan states that his BP is stable and takes his medications like he is supposed to. He is going to a chiropractor three times a week for his legs and back and he feels it is helping.       Expected Outcomes Short: Continue to attend rehab. Long: Continue to incorporate healthy ideas for the body like the chiropractor and stay on his medication regimen.                Core Components/Risk Factors/Patient Goals at Discharge (Final Review):   Goals and Risk Factor Review - 03/25/24 1115       Core Components/Risk Factors/Patient Goals Review   Personal Goals Review Hypertension;Lipids    Review Dalton Vaughan states that his BP is stable and takes his medications  like he is supposed to. He is going to a chiropractor three times a week for his legs and back and he feels it is helping.    Expected Outcomes Short: Continue to attend rehab. Long: Continue to incorporate healthy ideas for the body like the chiropractor and stay on his medication regimen.             ITP Comments:  ITP Comments     Row Name 02/18/24 1424 02/19/24 1027 03/11/24 0927 04/08/24 1008 04/20/24 1545   ITP Comments Patient attend orientation today.  Patient is attending Cardiac Rehabilitation Program.  Documentation for diagnosis can be found in [facility] 01/22/24.  Reviewed medical chart, RPE/RPD, gym safety, and program guidelines.  Patient was fitted to equipment they will be using during rehab.  Patient is scheduled to start exercise on Wednesday 02/20/24 at 1100.   Initial ITP created and sent for review and signature by Dr. Armida Lander, Medical Director for Cardiac Rehabilitation Program. First full day of exercise!  Patient was oriented to gym and equipment including functions, settings, policies, and procedures.  Patient's individual exercise prescription and treatment plan were reviewed.  All starting workloads were established  based on the results of the 6 minute walk test done at initial orientation visit.  The plan for exercise progression was also introduced and progression will be customized based on patient's performance and goals. 30 day review completed. ITP sent to Dr. Armida Lander, Medical Director of Cardiac Rehab. Continue with ITP unless changes are made by physician. 30 day review completed. ITP sent to Dr. Armida Lander, Medical Director of Cardiac Rehab. Continue with ITP unless changes are made by physician. Dalton Vaughan graduated today from  rehab with 36 sessions completed.  Details of the patient's exercise prescription and what He needs to do in order to continue the prescription and progress were discussed with patient.  Patient was given a copy of prescription and goals.  Patient verbalized understanding. Dalton Vaughan plans to continue to exercise by at home on is exercise equipment.            Comments: Discharge ITP

## 2024-04-22 ENCOUNTER — Encounter (HOSPITAL_COMMUNITY)

## 2024-04-22 ENCOUNTER — Other Ambulatory Visit: Payer: Medicare Other

## 2024-04-22 DIAGNOSIS — I252 Old myocardial infarction: Secondary | ICD-10-CM | POA: Diagnosis not present

## 2024-04-22 DIAGNOSIS — M62830 Muscle spasm of back: Secondary | ICD-10-CM | POA: Diagnosis not present

## 2024-04-22 DIAGNOSIS — M9903 Segmental and somatic dysfunction of lumbar region: Secondary | ICD-10-CM | POA: Diagnosis not present

## 2024-04-22 DIAGNOSIS — M9902 Segmental and somatic dysfunction of thoracic region: Secondary | ICD-10-CM | POA: Diagnosis not present

## 2024-04-22 DIAGNOSIS — Z7901 Long term (current) use of anticoagulants: Secondary | ICD-10-CM

## 2024-04-22 DIAGNOSIS — E782 Mixed hyperlipidemia: Secondary | ICD-10-CM | POA: Diagnosis not present

## 2024-04-22 DIAGNOSIS — I482 Chronic atrial fibrillation, unspecified: Secondary | ICD-10-CM

## 2024-04-22 DIAGNOSIS — M9901 Segmental and somatic dysfunction of cervical region: Secondary | ICD-10-CM | POA: Diagnosis not present

## 2024-04-22 LAB — CBC
Hematocrit: 41.4 % (ref 37.5–51.0)
Hemoglobin: 13.4 g/dL (ref 13.0–17.7)
MCH: 30.2 pg (ref 26.6–33.0)
MCHC: 32.4 g/dL (ref 31.5–35.7)
MCV: 93 fL (ref 79–97)
Platelets: 121 10*3/uL — ABNORMAL LOW (ref 150–450)
RBC: 4.44 x10E6/uL (ref 4.14–5.80)
RDW: 13.8 % (ref 11.6–15.4)
WBC: 5.2 10*3/uL (ref 3.4–10.8)

## 2024-04-22 LAB — LIPID PANEL
Chol/HDL Ratio: 2.6 ratio (ref 0.0–5.0)
Cholesterol, Total: 124 mg/dL (ref 100–199)
HDL: 47 mg/dL (ref >39)
LDL Chol Calc (NIH): 63 mg/dL (ref 0–99)
Triglycerides: 67 mg/dL (ref 0–149)
VLDL Cholesterol Cal: 14 mg/dL (ref 5–40)

## 2024-04-22 LAB — CMP14+EGFR
ALT: 17 IU/L (ref 0–44)
AST: 25 IU/L (ref 0–40)
Albumin: 4.5 g/dL (ref 3.6–4.6)
Alkaline Phosphatase: 117 IU/L (ref 44–121)
BUN/Creatinine Ratio: 14 (ref 10–24)
BUN: 12 mg/dL (ref 10–36)
Bilirubin Total: 0.6 mg/dL (ref 0.0–1.2)
CO2: 25 mmol/L (ref 20–29)
Calcium: 9.8 mg/dL (ref 8.6–10.2)
Chloride: 101 mmol/L (ref 96–106)
Creatinine, Ser: 0.86 mg/dL (ref 0.76–1.27)
Globulin, Total: 1.9 g/dL (ref 1.5–4.5)
Glucose: 82 mg/dL (ref 70–99)
Potassium: 3.7 mmol/L (ref 3.5–5.2)
Sodium: 141 mmol/L (ref 134–144)
Total Protein: 6.4 g/dL (ref 6.0–8.5)
eGFR: 81 mL/min/{1.73_m2} (ref >59)

## 2024-04-24 ENCOUNTER — Encounter (HOSPITAL_COMMUNITY)

## 2024-04-27 ENCOUNTER — Ambulatory Visit: Payer: Medicare Other | Admitting: Family Medicine

## 2024-04-27 ENCOUNTER — Encounter (HOSPITAL_COMMUNITY)

## 2024-04-29 ENCOUNTER — Encounter (HOSPITAL_COMMUNITY)

## 2024-04-29 ENCOUNTER — Other Ambulatory Visit: Payer: Self-pay | Admitting: Family Medicine

## 2024-04-29 ENCOUNTER — Telehealth: Payer: Self-pay | Admitting: Family Medicine

## 2024-04-29 DIAGNOSIS — J3489 Other specified disorders of nose and nasal sinuses: Secondary | ICD-10-CM

## 2024-04-29 DIAGNOSIS — M9903 Segmental and somatic dysfunction of lumbar region: Secondary | ICD-10-CM | POA: Diagnosis not present

## 2024-04-29 DIAGNOSIS — M9902 Segmental and somatic dysfunction of thoracic region: Secondary | ICD-10-CM | POA: Diagnosis not present

## 2024-04-29 DIAGNOSIS — M9901 Segmental and somatic dysfunction of cervical region: Secondary | ICD-10-CM | POA: Diagnosis not present

## 2024-04-29 DIAGNOSIS — M62830 Muscle spasm of back: Secondary | ICD-10-CM | POA: Diagnosis not present

## 2024-04-29 NOTE — Telephone Encounter (Signed)
 Copied from CRM [id number]. Topic: Clinical - Lab/Test Results >> Apr 29, 2024  9:19 AM Arlie Benedict B wrote: Reason for CRM: patient is needing for someone to call him to go over his lab results from 05/07 [phone]

## 2024-04-29 NOTE — Telephone Encounter (Signed)
 Spoke with pt , he just wanted to be able to write his lab numbers down , went back over them all with him   No further questions

## 2024-05-01 ENCOUNTER — Encounter (HOSPITAL_COMMUNITY)

## 2024-05-04 ENCOUNTER — Encounter (HOSPITAL_COMMUNITY)

## 2024-05-04 DIAGNOSIS — Z951 Presence of aortocoronary bypass graft: Secondary | ICD-10-CM | POA: Diagnosis not present

## 2024-05-04 DIAGNOSIS — Z950 Presence of cardiac pacemaker: Secondary | ICD-10-CM | POA: Diagnosis not present

## 2024-05-04 DIAGNOSIS — I214 Non-ST elevation (NSTEMI) myocardial infarction: Secondary | ICD-10-CM | POA: Diagnosis not present

## 2024-05-04 DIAGNOSIS — I4891 Unspecified atrial fibrillation: Secondary | ICD-10-CM | POA: Diagnosis not present

## 2024-05-04 DIAGNOSIS — I2510 Atherosclerotic heart disease of native coronary artery without angina pectoris: Secondary | ICD-10-CM | POA: Diagnosis not present

## 2024-05-04 DIAGNOSIS — I495 Sick sinus syndrome: Secondary | ICD-10-CM | POA: Diagnosis not present

## 2024-05-05 ENCOUNTER — Ambulatory Visit (INDEPENDENT_AMBULATORY_CARE_PROVIDER_SITE_OTHER): Admitting: Family Medicine

## 2024-05-05 ENCOUNTER — Encounter: Payer: Self-pay | Admitting: Family Medicine

## 2024-05-05 VITALS — BP 122/76 | HR 75 | Temp 98.5°F | Ht 70.0 in | Wt 154.0 lb

## 2024-05-05 DIAGNOSIS — R946 Abnormal results of thyroid function studies: Secondary | ICD-10-CM | POA: Diagnosis not present

## 2024-05-05 DIAGNOSIS — E039 Hypothyroidism, unspecified: Secondary | ICD-10-CM

## 2024-05-05 DIAGNOSIS — R5382 Chronic fatigue, unspecified: Secondary | ICD-10-CM

## 2024-05-05 DIAGNOSIS — Z8639 Personal history of other endocrine, nutritional and metabolic disease: Secondary | ICD-10-CM

## 2024-05-05 DIAGNOSIS — R6889 Other general symptoms and signs: Secondary | ICD-10-CM | POA: Diagnosis not present

## 2024-05-05 DIAGNOSIS — R140 Abdominal distension (gaseous): Secondary | ICD-10-CM | POA: Diagnosis not present

## 2024-05-05 DIAGNOSIS — R634 Abnormal weight loss: Secondary | ICD-10-CM | POA: Diagnosis not present

## 2024-05-05 DIAGNOSIS — I482 Chronic atrial fibrillation, unspecified: Secondary | ICD-10-CM | POA: Diagnosis not present

## 2024-05-05 MED ORDER — PANTOPRAZOLE SODIUM 40 MG PO TBEC: 40.0000 mg | DELAYED_RELEASE_TABLET | Freq: Every day | ORAL | 0 refills | Status: DC

## 2024-05-05 NOTE — Progress Notes (Signed)
 Subjective: CC: Weight loss, fatigue PCP: Eliodoro Guerin, DO HPI:Dalton Vaughan is a 89 y.o. male presenting to clinic today for:  1.  Weight loss, fatigue Patient reports that he has had about a 10 to 12 pound weight loss since he was hospitalized in February.  He has been actively working on diet to reduce cholesterol.  However, the fatigue seems to be a little worse than normal and he wonders if he may be has some type of deficiency.  He has been having ongoing abdominal bloating and was seen by gastroenterology a month ago and was placed on Pepcid  and advised to increase fiber to promote bowel movements.  They did not favor having him do an EGD or colonoscopy due to cardiovascular risk associated with that.  He continues his anticoagulant as prescribed.  He reports no rectal bleeding.  No difficulty with early satiety etc.   ROS: Per HPI  Allergies  Allergen Reactions   Paxil [Paroxetine Hcl] Palpitations   Codeine Other (See Comments)   Eliquis  [Apixaban ] Other (See Comments)    dizziness   Pravachol [Pravastatin Sodium] Other (See Comments)    myalgias   Zetia  [Ezetimibe ] Other (See Comments)    myalgias   Penicillins Hives, Rash and Other (See Comments)    Happened in 1959   Vytorin [Ezetimibe -Simvastatin] Other (See Comments)    myalgias   Past Medical History:  Diagnosis Date   Anal fissure    Arthritis    Atrial fibrillation (HCC)    BPH (benign prostatic hypertrophy)    CAD (coronary artery disease)    Dr. Anastasia Balo   Cataract    COVID-19    Depression 2004   Diverticulosis    GERD (gastroesophageal reflux disease)    History of TIAs    Hyperlipidemia    Hypertension    Hypertensive heart disease without CHF    Hypothyroidism    Internal hemorrhoids    Lumbar disc disease    NSTEMI (non-ST elevated myocardial infarction) (HCC) 01/22/2024   Oral bleeding 08/20/2012   Following molar tooth extraction July, 2013   Pneumonia    Ruptured disk 1995    Shingles    Thyroid  disease    Vitamin D  deficiency    Vitreous detachment     Current Outpatient Medications:    apixaban  (ELIQUIS ) 5 MG TABS tablet, Take 1 tablet (5 mg total) by mouth 2 (two) times daily., Disp: 180 tablet, Rfl: 1   azelastine  (ASTELIN ) 0.1 % nasal spray, USE 1 SPRAY IN EACH NOSTRIL TWICE A DAY, Disp: 60 mL, Rfl: 3   clopidogrel  (PLAVIX ) 75 MG tablet, Take 1 tablet (75 mg total) by mouth daily., Disp: 90 tablet, Rfl: 2   doxazosin  (CARDURA ) 8 MG tablet, Take 1 tablet (8 mg total) by mouth at bedtime., Disp: 90 tablet, Rfl: 3   ezetimibe  (ZETIA ) 10 MG tablet, Take 1 tablet (10 mg total) by mouth daily., Disp: 90 tablet, Rfl: 2   famotidine  (PEPCID ) 40 MG tablet, Take 1 tablet (40 mg total) by mouth daily., Disp: 30 tablet, Rfl: 3   HYDROcodone -acetaminophen  (NORCO/VICODIN) 5-325 MG tablet, Take 1 tablet by mouth every 8 (eight) hours as needed for severe pain (pain score 7-10). (Patient not taking: Reported on 03/26/2024), Disp: 15 tablet, Rfl: 0   isosorbide  mononitrate (IMDUR ) 30 MG 24 hr tablet, Take 1 tablet (30 mg total) by mouth daily., Disp: 90 tablet, Rfl: 1   Lactobacillus (PROBIOTIC ACIDOPHILUS) CAPS, Take 1 capsule by mouth daily., Disp: ,  Rfl:    levothyroxine  (SYNTHROID ) 50 MCG tablet, Take 1 tablet (50 mcg total) by mouth daily before breakfast., Disp: 90 tablet, Rfl: 3   meclizine (ANTIVERT) 25 MG tablet, Take 25 mg by mouth daily as needed for dizziness. (Patient not taking: Reported on 03/26/2024), Disp: , Rfl:    mirtazapine  (REMERON ) 15 MG tablet, Take 1 tablet (15 mg total) by mouth at bedtime., Disp: 100 tablet, Rfl: 3   Multiple Vitamin (MULTIVITAMIN) tablet, Take 1 tablet by mouth daily., Disp: , Rfl:    ramipril  (ALTACE ) 10 MG capsule, TAKE 1 CAPSULE DAILY, Disp: 90 capsule, Rfl: 3   rosuvastatin  (CRESTOR ) 10 MG tablet, Take 1 tablet (10 mg total) by mouth every other day., Disp: 90 tablet, Rfl: 0   SIMETHICONE  PO, Take 1 tablet by mouth as needed  (gas)., Disp: , Rfl:    vitamin C (ASCORBIC ACID) 500 MG tablet, Take 500 mg by mouth daily. , Disp: , Rfl:  Social History   Socioeconomic History   Marital status: Widowed    Spouse name: Haskell Linker    Number of children: 1   Years of education: 12   Highest education level: Not on file  Occupational History   Occupation: Retired    Veterinary surgeon: Company secretary x 27 years   Occupation: Retired    Associate Professor: Public librarian    Comment: supervisor x 17 years  Tobacco Use   Smoking status: Former    Current packs/day: 0.00    Average packs/day: 1 pack/day for 18.0 years (18.0 ttl pk-yrs)    Types: Cigarettes    Start date: 12/17/1948    Quit date: 12/17/1966    Years since quitting: 57.4   Smokeless tobacco: Never  Vaping Use   Vaping status: Never Used  Substance and Sexual Activity   Alcohol use: Yes    Alcohol/week: 1.0 standard drink of alcohol    Types: 1 Standard drinks or equivalent per week    Comment: rare   Drug use: No   Sexual activity: Not Currently  Other Topics Concern   Not on file  Social History Narrative   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[employer]      Right-handed      Caffeine use: none      Son and grandson live with him   Social Drivers of Corporate investment banker Strain: Low Risk  (03/05/2024)   Overall Financial Resource Strain (CARDIA)    Difficulty of Paying Living Expenses: Not very hard  Food Insecurity: No Food Insecurity (03/05/2024)   Hunger Vital Sign    Worried About Running Out of Food in the Last Year: Never true    Ran Out of Food in the Last Year: Never true  Transportation Needs: No Transportation Needs (03/05/2024)   PRAPARE - Administrator, Civil Service (Medical): No    Lack of Transportation (Non-Medical): No  Physical Activity: Sufficiently Active (03/05/2024)   Exercise Vital Sign    Days of Exercise per Week: 7 days    Minutes of Exercise per Session: 70 min  Stress: No Stress Concern Present  (03/05/2024)   [employer] of Occupational Health - Occupational Stress Questionnaire    Feeling of Stress : Not at all  Social Connections: Socially Isolated (03/05/2024)   Social Connection and Isolation Panel [NHANES]    Frequency of Communication with Friends and Family: More than three times a week    Frequency of Social Gatherings with Friends  and Family: Once a week    Attends Religious Services: Never    Active Member of Clubs or Organizations: No    Attends Banker Meetings: Never    Marital Status: Widowed  Intimate Partner Violence: Not At Risk (03/05/2024)   Humiliation, Afraid, Rape, and Kick questionnaire    Fear of Current or Ex-Partner: No    Emotionally Abused: No    Physically Abused: No    Sexually Abused: No   Family History  Problem Relation Age of Onset   Dementia Mother    Arthritis Mother    Cancer Brother        prostate   Asthma Sister    Heart disease Paternal Aunt    Heart disease Paternal Uncle    Hypertension Maternal Grandmother    CVA Maternal Grandmother    CVA Paternal Grandmother    Hepatitis C Son    Arthritis Son    Colon cancer Neg Hx    Colon polyps Neg Hx    Kidney disease Neg Hx    Esophageal cancer Neg Hx    Gallbladder disease Neg Hx    Diabetes Neg Hx     Objective: Office vital signs reviewed. BP 122/76   Pulse 75   Temp 98.5 F (36.9 C)   Ht 5\' 10"  (1.778 m)   Wt 154 lb (69.9 kg)   SpO2 96%   BMI 22.10 kg/m   Physical Examination:  General: Awake, alert, thin, nontoxic male, No acute distress HEENT: Sclera white.  Moist mucous membranes Cardio: regular rate and rhythm, S1S2 heard, no murmurs appreciated Pulm: clear to auscultation bilaterally, no wheezes, rhonchi or rales; normal work of breathing on room air GI: soft, non-tender, non-distended, bowel sounds present x4, no hepatomegaly, no splenomegaly, no masses    Assessment/ Plan: 89 y.o. male   Acquired hypothyroidism - Plan: TSH + free  T4  Chronic atrial fibrillation (HCC)  Chronic fatigue - Plan: Anemia Profile B, VITAMIN D  25 Hydroxy (Vit-D Deficiency, Fractures)  Weight loss - Plan: Anemia Profile B, VITAMIN D  25 Hydroxy (Vit-D Deficiency, Fractures)  History of vitamin D  deficiency - Plan: VITAMIN D  25 Hydroxy (Vit-D Deficiency, Fractures)  Abdominal bloating - Plan: pantoprazole  (PROTONIX ) 40 MG tablet  Check thyroid  levels, look at anemia profile, vitamin D  given history of D deficiency and he has been off of his vitamin D  since discharge from hospital.  I am going to switch him over to pantoprazole  from Pepcid  due to ongoing GERD symptoms with phlegm production in the morning and bloating.  Keep follow-up with GI next week   Eliodoro Guerin, DO [facility] [phone]

## 2024-05-06 ENCOUNTER — Encounter (HOSPITAL_COMMUNITY)

## 2024-05-06 ENCOUNTER — Ambulatory Visit: Payer: Self-pay | Admitting: Family Medicine

## 2024-05-06 LAB — ANEMIA PROFILE B
Basophils Absolute: 0 10*3/uL (ref 0.0–0.2)
Basos: 0 %
EOS (ABSOLUTE): 0.1 10*3/uL (ref 0.0–0.4)
Eos: 1 %
Ferritin: 98 ng/mL (ref 30–400)
Folate: >20 ng/mL (ref >3.0)
Hematocrit: 40.5 % (ref 37.5–51.0)
Hemoglobin: 12.6 g/dL — ABNORMAL LOW (ref 13.0–17.7)
Immature Grans (Abs): 0 10*3/uL (ref 0.0–0.1)
Immature Granulocytes: 0 %
Iron Saturation: 16 % (ref 15–55)
Iron: 51 ug/dL (ref 38–169)
Lymphocytes Absolute: 1.6 10*3/uL (ref 0.7–3.1)
Lymphs: 31 %
MCH: 29.9 pg (ref 26.6–33.0)
MCHC: 31.1 g/dL — ABNORMAL LOW (ref 31.5–35.7)
MCV: 96 fL (ref 79–97)
Monocytes Absolute: 0.3 10*3/uL (ref 0.1–0.9)
Monocytes: 6 %
Neutrophils Absolute: 3.3 10*3/uL (ref 1.4–7.0)
Neutrophils: 62 %
Platelets: 128 10*3/uL — ABNORMAL LOW (ref 150–450)
RBC: 4.21 x10E6/uL (ref 4.14–5.80)
RDW: 14.2 % (ref 11.6–15.4)
Retic Ct Pct: 0.5 % — ABNORMAL LOW (ref 0.6–2.6)
Total Iron Binding Capacity: 322 ug/dL (ref 250–450)
UIBC: 271 ug/dL (ref 111–343)
Vitamin B-12: 718 pg/mL (ref 232–1245)
WBC: 5.3 10*3/uL (ref 3.4–10.8)

## 2024-05-06 LAB — VITAMIN D 25 HYDROXY (VIT D DEFICIENCY, FRACTURES): Vit D, 25-Hydroxy: 68.1 ng/mL (ref 30.0–100.0)

## 2024-05-07 NOTE — CV Procedure (Signed)
  Device system confirmed to be MRI conditional, with implant date > 6 weeks ago, and no evidence of abandoned or epicardial leads in review of most recent CXR  Device last cleared by EP Provider: Creighton Doffing 05/06/24  Clearance is good through for 1 year as long as parameters remain stable at time of check. If pt undergoes a cardiac device procedure during that time, they should be re-cleared.   Tachy-therapies to be programmed off if applicable with device back to pre-MRI settings after completion of exam.  [employer] - Programming recommendation received through [employer] App/Tablet  Arlys Lamer, RT  05/07/2024 9:25 AM

## 2024-05-08 ENCOUNTER — Encounter (HOSPITAL_COMMUNITY)

## 2024-05-08 LAB — SPECIMEN STATUS REPORT

## 2024-05-08 LAB — TSH+FREE T4
Free T4: 1.26 ng/dL (ref 0.82–1.77)
TSH: 3.34 u[IU]/mL (ref 0.450–4.500)

## 2024-05-11 ENCOUNTER — Encounter (HOSPITAL_COMMUNITY)

## 2024-05-12 ENCOUNTER — Ambulatory Visit (HOSPITAL_COMMUNITY)
Admission: RE | Admit: 2024-05-12 | Discharge: 2024-05-12 | Disposition: A | Discharge status: Home / Self Care | Source: Ambulatory Visit | Attending: Surgery | Admitting: Surgery

## 2024-05-13 ENCOUNTER — Encounter (HOSPITAL_COMMUNITY)

## 2024-05-15 ENCOUNTER — Encounter (HOSPITAL_COMMUNITY)

## 2024-05-18 ENCOUNTER — Encounter (HOSPITAL_COMMUNITY)

## 2024-05-20 ENCOUNTER — Encounter (HOSPITAL_COMMUNITY)

## 2024-05-21 NOTE — Progress Notes (Signed)
 "  Chief Complaint: Constipation Primary GI MD: Dr. Albertus  HPI: Discussed the use of AI scribe software for clinical note transcription with the patient, who gave verbal consent to proceed.  History of Present Illness Dalton Vaughan is a 89 year old male with gastroesophageal reflux disease and chronic constipation who presents for follow-up of his symptoms.  He has a history of gastroesophageal reflux disease characterized by excessive burping and reflux, particularly exacerbated by raw foods. He was previously prescribed famotidine  40 mg once daily, but has since been switched to Protonix . He notes improvement in symptoms, stating he no longer feels faint unless he overeats. He avoids raw foods, opting to cook items like apples and carrots, which he finds easier to digest.  He has a long-standing issue with constipation, with a CT scan in 2022 revealing significant stool retention. He has been using Benefiber daily and has added psyllium husk to his regimen. Despite these measures, he reports needing to push during bowel movements, which occur daily. He describes his stools as 'semi' in consistency.  He has experienced weight loss, which he attributes to dietary changes aimed at lowering his cholesterol following a mild heart attack in early February. He has eliminated sweets, bread, and red meat from his diet, focusing on chicken, fish, squash, zucchini, and potatoes. He exercises daily and was participating in cardiac rehab a month ago. His total cholesterol has decreased to approximately 128.   PREVIOUS GI WORKUP   CT abdomen pelvis with contrast 10/2021 - Stable large stool burden - Colonic diverticulosis - Small umbilical hernia   He had a flexible sigmoidoscopy in April 2016 that showed sigmoid diverticulosis.   Past Medical History:  Diagnosis Date   Anal fissure    Arthritis    Atrial fibrillation (HCC)    BPH (benign prostatic hypertrophy)    CAD (coronary artery disease)     Dr. Blanca   Cataract    COVID-19    Depression 2004   Diverticulosis    GERD (gastroesophageal reflux disease)    History of TIAs    Hyperlipidemia    Hypertension    Hypertensive heart disease without CHF    Hypothyroidism    Internal hemorrhoids    Lumbar disc disease    NSTEMI (non-ST elevated myocardial infarction) (HCC) 01/22/2024   Oral bleeding 08/20/2012   Following molar tooth extraction July, 2013   Pneumonia    Ruptured disk 1995   Shingles    Thyroid  disease    Vitamin D  deficiency    Vitreous detachment     Past Surgical History:  Procedure Laterality Date   bilateral cataract surgery  6/07   CARDIAC CATHETERIZATION     CORONARY ARTERY BYPASS GRAFT  12/99    Dr. Army DOWNS ANGIOGRAPHY N/A 01/28/2024   Procedure: CORONARY/GRAFT ANGIOGRAPHY;  Surgeon: Elmira Newman PARAS, MD;  Location: [facility];  Service: Cardiovascular;  Laterality: N/A;   cysloscopy  8/08   EYE SURGERY     HERNIA REPAIR  1997   right   herninated disc  1995   PACEMAKER INSERTION  11/03/2020   PROSTATE BIOPSY  8/08   TONSILLECTOMY AND ADENOIDECTOMY      Current Outpatient Medications  Medication Sig Dispense Refill   apixaban  (ELIQUIS ) 5 MG TABS tablet Take 1 tablet (5 mg total) by mouth 2 (two) times daily. 180 tablet 1   azelastine  (ASTELIN ) 0.1 % nasal spray USE 1 SPRAY IN EACH NOSTRIL TWICE A DAY 60  mL 3   clopidogrel  (PLAVIX ) 75 MG tablet Take 1 tablet (75 mg total) by mouth daily. 90 tablet 2   doxazosin  (CARDURA ) 8 MG tablet Take 1 tablet (8 mg total) by mouth at bedtime. 90 tablet 3   ezetimibe  (ZETIA ) 10 MG tablet Take 1 tablet (10 mg total) by mouth daily. 90 tablet 2   isosorbide  mononitrate (IMDUR ) 30 MG 24 hr tablet Take 1 tablet (30 mg total) by mouth daily. 90 tablet 1   Lactobacillus (PROBIOTIC ACIDOPHILUS) CAPS Take 1 capsule by mouth daily.     levothyroxine  (SYNTHROID ) 50 MCG tablet Take 1 tablet (50 mcg total) by mouth daily before  breakfast. 90 tablet 3   mirtazapine  (REMERON ) 15 MG tablet Take 1 tablet (15 mg total) by mouth at bedtime. 100 tablet 3   Multiple Vitamin (MULTIVITAMIN) tablet Take 1 tablet by mouth daily.     pantoprazole  (PROTONIX ) 40 MG tablet Take 1 tablet (40 mg total) by mouth daily. 90 tablet 0   ramipril  (ALTACE ) 10 MG capsule TAKE 1 CAPSULE DAILY 90 capsule 3   rosuvastatin  (CRESTOR ) 10 MG tablet Take 1 tablet (10 mg total) by mouth every other day. 90 tablet 0   SIMETHICONE  PO Take 1 tablet by mouth as needed (gas).     vitamin C (ASCORBIC ACID) 500 MG tablet Take 500 mg by mouth daily.      meclizine (ANTIVERT) 25 MG tablet Take 25 mg by mouth daily as needed for dizziness. (Patient not taking: Reported on 05/22/2024)     No current facility-administered medications for this visit.    Allergies as of 05/22/2024 - Review Complete 05/22/2024  Allergen Reaction Noted   Paxil [paroxetine hcl] Palpitations 04/22/2013   Codeine Other (See Comments) 04/24/2016   Eliquis  [apixaban ] Other (See Comments) 04/22/2013   Pravachol [pravastatin sodium] Other (See Comments) 08/26/2012   Zetia  [ezetimibe ] Other (See Comments) 08/26/2012   Penicillins Hives, Rash, and Other (See Comments) 07/05/2012   Vytorin [ezetimibe -simvastatin] Other (See Comments) 04/22/2013    Family History  Problem Relation Age of Onset   Dementia Mother    Arthritis Mother    Cancer Brother        prostate   Asthma Sister    Heart disease Paternal Aunt    Heart disease Paternal Uncle    Hypertension Maternal Grandmother    CVA Maternal Grandmother    CVA Paternal Grandmother    Hepatitis C Son    Arthritis Son    Colon cancer Neg Hx    Colon polyps Neg Hx    Kidney disease Neg Hx    Esophageal cancer Neg Hx    Gallbladder disease Neg Hx    Diabetes Neg Hx     Social History   Socioeconomic History   Marital status: Widowed    Spouse name: [name]    Number of children: 1   Years of education: 12   Highest  education level: Not on file  Occupational History   Occupation: Retired    Veterinary Surgeon: Company Secretary x 27 years   Occupation: Retired    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    Comment: supervisor x 17 years  Tobacco Use   Smoking status: Former    Current packs/day: 0.00    Average packs/day: 1 pack/day for 18.0 years (18.0 ttl pk-yrs)    Types: Cigarettes    Start date: 12/17/1948    Quit date: 12/17/1966    Years since quitting: 57.4   Smokeless tobacco: Never  Vaping Use   Vaping status: Never Used  Substance and Sexual Activity   Alcohol use: Yes    Alcohol/week: 1.0 standard drink of alcohol    Types: 1 Standard drinks or equivalent per week    Comment: rare   Drug use: No   Sexual activity: Not Currently  Other Topics Concern   Not on file  Social History Narrative   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-handed      Caffeine use: none      Son and grandson live with him   Social Drivers of Corporate Investment Banker Strain: Low Risk  (03/05/2024)   Overall Financial Resource Strain (CARDIA)    Difficulty of Paying Living Expenses: Not very hard  Food Insecurity: No Food Insecurity (03/05/2024)   Hunger Vital Sign    Worried About Running Out of Food in the Last Year: Never true    Ran Out of Food in the Last Year: Never true  Transportation Needs: No Transportation Needs (03/05/2024)   PRAPARE - Administrator, Civil Service (Medical): No    Lack of Transportation (Non-Medical): No  Physical Activity: Sufficiently Active (03/05/2024)   Exercise Vital Sign    Days of Exercise per Week: 7 days    Minutes of Exercise per Session: 70 min  Stress: No Stress Concern Present (03/05/2024)   [employer] of Occupational Health - Occupational Stress Questionnaire    Feeling of Stress : Not at all  Social Connections: Socially Isolated (03/05/2024)   Social Connection and Isolation Panel [NHANES]    Frequency of Communication with  Friends and Family: More than three times a week    Frequency of Social Gatherings with Friends and Family: Once a week    Attends Religious Services: Never    Database Administrator or Organizations: No    Attends Banker Meetings: Never    Marital Status: Widowed  Intimate Partner Violence: Not At Risk (03/05/2024)   Humiliation, Afraid, Rape, and Kick questionnaire    Fear of Current or Ex-Partner: No    Emotionally Abused: No    Physically Abused: No    Sexually Abused: No    Review of Systems:    Constitutional: No weight loss, fever, chills, weakness or fatigue HEENT: Eyes: No change in vision               Ears, Nose, Throat:  No change in hearing or congestion Skin: No rash or itching Cardiovascular: No chest pain, chest pressure or palpitations   Respiratory: No SOB or cough Gastrointestinal: See HPI and otherwise negative Genitourinary: No dysuria or change in urinary frequency Neurological: No headache, dizziness or syncope Musculoskeletal: No new muscle or joint pain Hematologic: No bleeding or bruising Psychiatric: No history of depression or anxiety    Physical Exam:  Vital signs: BP 116/80 (BP Location: Left Arm, Patient Position: Sitting, Cuff Size: Normal)   Pulse 76   Ht 5' 8.5 (1.74 m) Comment: height measured without shoes  Wt 147 lb 6 oz (66.8 kg)   BMI 22.08 kg/m   Constitutional: NAD, alert and cooperative.  Active, younger appearing than stated age Head:  Normocephalic and atraumatic. Eyes:   PEERL, EOMI. No icterus. Conjunctiva pink. Respiratory: Respirations even and unlabored. Lungs clear to auscultation bilaterally.   No wheezes, crackles, or rhonchi.  Cardiovascular:  Regular rate and rhythm. No peripheral edema, cyanosis or pallor.  Gastrointestinal:  Soft, nondistended, nontender. No rebound or guarding. Normal bowel sounds. No appreciable masses or hepatomegaly. Rectal:  Declines Msk:  Symmetrical without gross deformities.  Without edema, no deformity or joint abnormality.  Neurologic:  Alert and  oriented x4;  grossly normal neurologically.  Skin:   Dry and intact without significant lesions or rashes. Psychiatric: Oriented to person, place and time. Demonstrates good judgement and reason without abnormal affect or behaviors.  Physical Exam ABDOMEN: Normal bowel sounds.   RELEVANT LABS AND IMAGING: CBC    Component Value Date/Time   WBC 5.3 05/05/2024 1017   WBC 6.7 01/28/2024 0630   RBC 4.21 05/05/2024 1017   RBC 3.92 (L) 01/28/2024 0630   HGB 12.6 (L) 05/05/2024 1017   HGB 12.1 (L) 08/20/2012 1100   HCT 40.5 05/05/2024 1017   HCT 37.7 (L) 08/20/2012 1100   PLT 128 (L) 05/05/2024 1017   MCV 96 05/05/2024 1017   MCV 88.5 08/20/2012 1100   MCH 29.9 05/05/2024 1017   MCH 30.4 01/28/2024 0630   MCHC 31.1 (L) 05/05/2024 1017   MCHC 33.3 01/28/2024 0630   RDW 14.2 05/05/2024 1017   RDW 15.3 (H) 08/20/2012 1100   LYMPHSABS 1.6 05/05/2024 1017   LYMPHSABS 2.1 08/20/2012 1100   MONOABS 0.5 01/09/2024 1038   MONOABS 0.4 08/20/2012 1100   EOSABS 0.1 05/05/2024 1017   BASOSABS 0.0 05/05/2024 1017   BASOSABS 0.0 08/20/2012 1100    CMP     Component Value Date/Time   NA 141 04/22/2024 0921   K 3.7 04/22/2024 0921   CL 101 04/22/2024 0921   CO2 25 04/22/2024 0921   GLUCOSE 82 04/22/2024 0921   GLUCOSE 94 01/28/2024 0630   BUN 12 04/22/2024 0921   CREATININE 0.86 04/22/2024 0921   CREATININE 0.74 04/22/2013 1156   CALCIUM  9.8 04/22/2024 0921   PROT 6.4 04/22/2024 0921   ALBUMIN 4.5 04/22/2024 0921   AST 25 04/22/2024 0921   ALT 17 04/22/2024 0921   ALKPHOS 117 04/22/2024 0921   BILITOT 0.6 04/22/2024 0921   GFRNONAA >60 01/28/2024 0630   GFRNONAA 87 04/22/2013 1156   GFRAA 96 01/06/2021 1457   GFRAA >89 04/22/2013 1156     Assessment/Plan:   89 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  Improved with dietary changes (avoiding raw foods).  Recently switched from famotidine  to pantoprazole  per PCP and doing well with adequate control of symptoms.  Recent CBC, CMP, TSH were normal - Continue pantoprazole  40 Mg once daily - Educated patient on GERD lifestyle modifications provide patient education handouts - Continue low FODMAP diet and avoiding raw foods   Constipation Flatulence CT abdomen pelvis 2022 with significantly large stool burden.  Flexible sigmoidoscopy in 2016 showing diverticulosis.  Recent CBC/CMP/TSH unrevealing.  Improved with dietary changes and Benefiber daily but still having some straining with bowel movements.  Suspect pelvic floor dysfunction.  Declined rectal exam today.  Advised pelvic floor physical therapy in addition to MiraLAX , patient politely declined pelvic floor physical therapy. - Continue daily Benefiber - Added MiraLAX  1 capful per day and adjust dose based on response - Recommended pelvic floor physical therapy due to suspected pelvic floor dysfunction, patient politely declined but is agreeable if MiraLAX  does not provide relief - Follow-up 3 months     NSTEMI Recent admission with NSTEMI 01/2024 underwent LHC 01/28/2024 showing multivessel CAD.  No intervention performed.  Medical therapy recommended. Discharged on Plavix , Imdur , rosuvastatin .  Warfarin changed to Eliquis .  [facility].  Just completed cardiac rehab and has been intentionally losing weight through diet and exercise - With recent NSTEMI and extensive comorbidities as well as age any endoscopic intervention would be very high risk in this patient.   Nestor Blower, PA-C [facility] 05/22/2024, 10:07 AM  Cc: Jolinda Norene HERO, DO "

## 2024-05-22 ENCOUNTER — Encounter: Payer: Self-pay | Admitting: Gastroenterology

## 2024-05-22 ENCOUNTER — Ambulatory Visit: Admitting: Gastroenterology

## 2024-05-22 VITALS — BP 116/80 | HR 76 | Ht 68.5 in | Wt 147.4 lb

## 2024-05-22 DIAGNOSIS — I2510 Atherosclerotic heart disease of native coronary artery without angina pectoris: Secondary | ICD-10-CM | POA: Diagnosis not present

## 2024-05-22 DIAGNOSIS — I482 Chronic atrial fibrillation, unspecified: Secondary | ICD-10-CM

## 2024-05-22 DIAGNOSIS — I25119 Atherosclerotic heart disease of native coronary artery with unspecified angina pectoris: Secondary | ICD-10-CM

## 2024-05-22 DIAGNOSIS — K5900 Constipation, unspecified: Secondary | ICD-10-CM

## 2024-05-22 DIAGNOSIS — R142 Eructation: Secondary | ICD-10-CM | POA: Diagnosis not present

## 2024-05-22 DIAGNOSIS — R143 Flatulence: Secondary | ICD-10-CM

## 2024-05-22 DIAGNOSIS — K219 Gastro-esophageal reflux disease without esophagitis: Secondary | ICD-10-CM

## 2024-05-22 DIAGNOSIS — R141 Gas pain: Secondary | ICD-10-CM

## 2024-05-22 NOTE — Patient Instructions (Addendum)
 Please follow up with Dr. Felisa Hose in 3 months or [place] in October.  Start taking Miralax  1 capful (17 grams) 1x / day for 1 week.   If this is not effective, increase to 1 dose 2x / day for 1 week.   If this is still not effective, increase to two capfuls (34 grams) 2x / day.   Can adjust dose as needed based on response. Can take 1/2 cap daily, skip days, or increase per day.       FODMAP stands for fermentable oligo-, di-, mono-saccharides and polyols (1). These are the scientific terms used to classify groups of carbs that are difficult for our body to digest and that are notorious for triggering digestive symptoms like bloating, gas, loose stools and stomach pain.   You can try low FODMAP diet  - start with eliminating just one column at a time that you feel may be a trigger for you. - the table at the very bottom contains foods that are low in FODMAPs   Sometimes trying to eliminate the FODMAP's from your diet is difficult or tricky, if you are stuggling with trying to do the elimination diet you can try an enzyme.  There is a food enzymes that you sprinkle in or on your food that helps break down the FODMAP. You can read more about the enzyme by going to this site: https://fodzyme.com/

## 2024-05-26 ENCOUNTER — Ambulatory Visit (HOSPITAL_COMMUNITY): Admission: RE | Admit: 2024-05-26 | Source: Ambulatory Visit

## 2024-05-26 ENCOUNTER — Encounter (HOSPITAL_COMMUNITY): Payer: Self-pay

## 2024-05-26 NOTE — Progress Notes (Signed)
 Addendum: Reviewed and agree with assessment and management plan. Asha Grumbine, Carie Caddy, MD

## 2024-05-28 DIAGNOSIS — M79676 Pain in unspecified toe(s): Secondary | ICD-10-CM | POA: Diagnosis not present

## 2024-05-28 DIAGNOSIS — B351 Tinea unguium: Secondary | ICD-10-CM | POA: Diagnosis not present

## 2024-05-28 DIAGNOSIS — L84 Corns and callosities: Secondary | ICD-10-CM | POA: Diagnosis not present

## 2024-05-28 DIAGNOSIS — I70203 Unspecified atherosclerosis of native arteries of extremities, bilateral legs: Secondary | ICD-10-CM | POA: Diagnosis not present

## 2024-06-29 ENCOUNTER — Telehealth: Payer: Self-pay | Admitting: Family Medicine

## 2024-06-29 MED ORDER — DOXAZOSIN MESYLATE 8 MG PO TABS: 8.0000 mg | ORAL_TABLET | Freq: Every day | ORAL | 0 refills | Status: DC

## 2024-06-29 NOTE — Telephone Encounter (Signed)
LMOVM refill sent to [facility] 

## 2024-06-29 NOTE — Telephone Encounter (Signed)
 Pt is out of doxazosin  (CARDURA ) 8 MG tablet. He will his mail order refill is on the way 3-5 bus days. Pt is asking for a refill to local pharmacy until he gets his mail order. Use [facility]. Please call back

## 2024-07-09 ENCOUNTER — Inpatient Hospital Stay: Payer: TRICARE For Life (TFL) | Attending: Hematology

## 2024-07-09 DIAGNOSIS — I252 Old myocardial infarction: Secondary | ICD-10-CM | POA: Diagnosis not present

## 2024-07-09 DIAGNOSIS — D696 Thrombocytopenia, unspecified: Secondary | ICD-10-CM | POA: Diagnosis not present

## 2024-07-09 DIAGNOSIS — Z79899 Other long term (current) drug therapy: Secondary | ICD-10-CM | POA: Diagnosis not present

## 2024-07-09 LAB — CBC WITH DIFFERENTIAL/PLATELET
Abs Immature Granulocytes: 0.01 K/uL (ref 0.00–0.07)
Basophils Absolute: 0 K/uL (ref 0.0–0.1)
Basophils Relative: 0 %
Eosinophils Absolute: 0.2 K/uL (ref 0.0–0.5)
Eosinophils Relative: 3 %
HCT: 37.2 % — ABNORMAL LOW (ref 39.0–52.0)
Hemoglobin: 12.3 g/dL — ABNORMAL LOW (ref 13.0–17.0)
Immature Granulocytes: 0 %
Lymphocytes Relative: 28 %
Lymphs Abs: 1.6 K/uL (ref 0.7–4.0)
MCH: 30.9 pg (ref 26.0–34.0)
MCHC: 33.1 g/dL (ref 30.0–36.0)
MCV: 93.5 fL (ref 80.0–100.0)
Monocytes Absolute: 0.4 K/uL (ref 0.1–1.0)
Monocytes Relative: 7 %
Neutro Abs: 3.4 K/uL (ref 1.7–7.7)
Neutrophils Relative %: 62 %
Platelets: 127 K/uL — ABNORMAL LOW (ref 150–400)
RBC: 3.98 MIL/uL — ABNORMAL LOW (ref 4.22–5.81)
RDW: 15.8 % — ABNORMAL HIGH (ref 11.5–15.5)
WBC: 5.6 K/uL (ref 4.0–10.5)
nRBC: 0 % (ref 0.0–0.2)

## 2024-07-15 DIAGNOSIS — I495 Sick sinus syndrome: Secondary | ICD-10-CM | POA: Diagnosis not present

## 2024-07-15 DIAGNOSIS — Z950 Presence of cardiac pacemaker: Secondary | ICD-10-CM | POA: Diagnosis not present

## 2024-07-16 ENCOUNTER — Inpatient Hospital Stay (HOSPITAL_BASED_OUTPATIENT_CLINIC_OR_DEPARTMENT_OTHER): Payer: TRICARE For Life (TFL) | Admitting: Oncology

## 2024-07-16 DIAGNOSIS — D696 Thrombocytopenia, unspecified: Secondary | ICD-10-CM | POA: Diagnosis not present

## 2024-07-16 DIAGNOSIS — I252 Old myocardial infarction: Secondary | ICD-10-CM | POA: Diagnosis not present

## 2024-07-16 DIAGNOSIS — Z79899 Other long term (current) drug therapy: Secondary | ICD-10-CM | POA: Diagnosis not present

## 2024-07-16 NOTE — Progress Notes (Signed)
 [facility] [address] [place] [address]   Clinic Day:  07/16/2024  Referring physician: Jolinda Norene HERO, DO  Patient Care Team: Jolinda Norene HERO, DO as PCP - General (Family Medicine) Monetta Redell PARAS, MD as PCP - Cardiology (Cardiology) Renda Glance, MD as Consulting Physician (Urology) Pyrtle, Gordy HERO, MD as Consulting Physician (Gastroenterology) Octavia Charleston, MD as Consulting Physician (Ophthalmology) Lilian Pierre Marsa Raddle., MD as Referring Physician (Cardiology)   ASSESSMENT & PLAN:   Assessment:  1.  Mild intermittent thrombocytopenia: - Patient seen for intermittent mild thrombocytopenia since 2013. - CBC on 06/04/2023 PLT-125.  Normal Hb and WBC.  B12 and folate normal. - Denies any easy bruising.  Denies any nosebleeds. - He is on Coumadin  for 20 years for chronic A-fib.  He reportedly had dizzy spells with Eliquis  and could not tolerate it when they tried to switch. - CTA PE (11/05/2021): Spleen size normal.  No B symptoms.  2.  Social/family history: - Lives with son and grandson at home.  Independent of ADLs and IADLs.  He served in the [employer] for 27 years and participated in 2 tours in [place].  Exposure to agent orange present.  Quit smoking in 1968.  Worked at [employer] after [employer]. - Half brother had multiple myeloma.  Maternal uncle had lung cancer.  Plan:  1.  Mild intermittent thrombocytopenia: - Platelet count has been intermittently low beginning in 2022 ranging from 122 to normal. - Hematology workup from 06/24/2023 shows no evidence of nutritional deficiencies with normal MMA and copper .  Ferritin, iron saturations and folate are all within normal limits.  CBC shows normal hemoglobin with a platelet count of 149.  No evidence of infection with nonreactive or normal hep B and hep C lab results.  No evidence of connective tissue disorder with negative ANA, rheumatoid factor and LDH.  No evidence of  underlying bone marrow infiltrative process with unremarkable immunofixation and protein electrophoresis.  No evidence of monoclonal protein is present. -Etiology likely secondary to immune mediated thrombocytopenia.  -We discussed differential diagnosis of mild thrombocytopenia including immune mediated thrombocytopenia versus myelodysplastic syndrome.  Ideally one would need a bone marrow biopsy after age 25 to rule out component of MDS.  Since he has mild thrombocytopenia, may consider observation at this time.  If the platelet count drops < 100,000 consistently, would recommend bone marrow aspiration biopsy.  -Labs from 09/09/2024 show hemoglobin of 12.3 (12.6) and platelet count of 127,000. -Platelet counts are very stable at this time.  2.  Non-STEMI: -Completed cardiac rehab. -She was recently switched from Coumadin  to Eliquis . -She is followed by cardiology. -She is working on his cholesterol and was put back on Crestor  with increased dose.  PLAN SUMMARY: >> Recommend follow-up in 6 months with lab work a couple days before.    I spent 25 minutes dedicated to the care of this patient (face-to-face and non-face-to-face) on the date of the encounter to include what is described in the assessment and plan.  Orders Placed This Encounter  Procedures   CBC with Differential    Standing Status:   Future    Expected Date:   01/16/2025    Expiration Date:   07/16/2025    Delon FORBES Hope, NP   7/31/202512:27 PM  CHIEF COMPLAINT/PURPOSE OF CONSULT:   Diagnosis: Mild thrombocytopenia   Cancer Staging  No matching staging information was found for the patient.    Prior Therapy: None  Current  Therapy: Observation   HISTORY OF PRESENT ILLNESS:   Oncology History   No history exists.     Domingo is a 89 y.o. male presenting to clinic today for follow-up for low platelet counts.   In the interim, patient was hospitalized for non-STEMI from 01/22/2024 through 01/28/2024.  Underwent  left heart catheterization on 01/28/2024 that showed multivessel CAD.  He was discharged on Plavix , Imdur  and Crestor  and Zetia .  Anticoagulation changed to Eliquis  from Coumadin .  Patient underwent cardiac rehab.  He reports feeling fine since the hospital stay.  Reports he works out every morning.  He has lost some weight due to changes in his diet.  Diet is 100% energy levels are 70%.  Denies any pain.  Reports he has occasional dizziness when he stands too quickly. Has difficulty starting his urine stream and sleep problems due to leg pain.    PAST MEDICAL HISTORY:   Past Medical History: Past Medical History:  Diagnosis Date   Anal fissure    Arthritis    Atrial fibrillation (HCC)    BPH (benign prostatic hypertrophy)    CAD (coronary artery disease)    Dr. Blanca   Cataract    COVID-19    Depression 2004   Diverticulosis    GERD (gastroesophageal reflux disease)    History of TIAs    Hyperlipidemia    Hypertension    Hypertensive heart disease without CHF    Hypothyroidism    Internal hemorrhoids    Lumbar disc disease    NSTEMI (non-ST elevated myocardial infarction) (HCC) 01/22/2024   Oral bleeding 08/20/2012   Following molar tooth extraction July, 2013   Pneumonia    Ruptured disk 1995   Shingles    Thyroid  disease    Vitamin D  deficiency    Vitreous detachment     Surgical History: Past Surgical History:  Procedure Laterality Date   bilateral cataract surgery  6/07   CARDIAC CATHETERIZATION     CORONARY ARTERY BYPASS GRAFT  12/99    Dr. Army DOWNS ANGIOGRAPHY N/A 01/28/2024   Procedure: CORONARY/GRAFT ANGIOGRAPHY;  Surgeon: Elmira Newman PARAS, MD;  Location: [facility];  Service: Cardiovascular;  Laterality: N/A;   cysloscopy  8/08   EYE SURGERY     HERNIA REPAIR  1997   right   herninated disc  1995   PACEMAKER INSERTION  11/03/2020   PROSTATE BIOPSY  8/08   TONSILLECTOMY AND ADENOIDECTOMY      Social History: Social  History   Socioeconomic History   Marital status: Widowed    Spouse name: [name]    Number of children: 1   Years of education: 12   Highest education level: Not on file  Occupational History   Occupation: Retired    Veterinary surgeon: Company secretary x 27 years   Occupation: Retired    Associate Professor: Public librarian    Comment: supervisor x 17 years  Tobacco Use   Smoking status: Former    Current packs/day: 0.00    Average packs/day: 1 pack/day for 18.0 years (18.0 ttl pk-yrs)    Types: Cigarettes    Start date: 12/17/1948    Quit date: 12/17/1966    Years since quitting: 57.6   Smokeless tobacco: Never  Vaping Use   Vaping status: Never Used  Substance and Sexual Activity   Alcohol use: Yes    Alcohol/week: 1.0 standard drink of alcohol    Types: 1 Standard drinks or equivalent per week  Comment: rare   Drug use: No   Sexual activity: Not Currently  Other Topics Concern   Not on file  Social History Narrative   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[employer]      Right-handed      Caffeine use: none      Son and grandson live with him   Social Drivers of Corporate investment banker Strain: Low Risk  (03/05/2024)   Overall Financial Resource Strain (CARDIA)    Difficulty of Paying Living Expenses: Not very hard  Food Insecurity: No Food Insecurity (03/05/2024)   Hunger Vital Sign    Worried About Running Out of Food in the Last Year: Never true    Ran Out of Food in the Last Year: Never true  Transportation Needs: No Transportation Needs (03/05/2024)   PRAPARE - Administrator, Civil Service (Medical): No    Lack of Transportation (Non-Medical): No  Physical Activity: Sufficiently Active (03/05/2024)   Exercise Vital Sign    Days of Exercise per Week: 7 days    Minutes of Exercise per Session: 70 min  Stress: No Stress Concern Present (03/05/2024)   [employer] of Occupational Health - Occupational Stress Questionnaire    Feeling of Stress : Not  at all  Social Connections: Socially Isolated (03/05/2024)   Social Connection and Isolation Panel    Frequency of Communication with Friends and Family: More than three times a week    Frequency of Social Gatherings with Friends and Family: Once a week    Attends Religious Services: Never    Database administrator or Organizations: No    Attends Banker Meetings: Never    Marital Status: Widowed  Intimate Partner Violence: Not At Risk (03/05/2024)   Humiliation, Afraid, Rape, and Kick questionnaire    Fear of Current or Ex-Partner: No    Emotionally Abused: No    Physically Abused: No    Sexually Abused: No    Family History: Family History  Problem Relation Age of Onset   Dementia Mother    Arthritis Mother    Cancer Brother        prostate   Asthma Sister    Heart disease Paternal Aunt    Heart disease Paternal Uncle    Hypertension Maternal Grandmother    CVA Maternal Grandmother    CVA Paternal Grandmother    Hepatitis C Son    Arthritis Son    Colon cancer Neg Hx    Colon polyps Neg Hx    Kidney disease Neg Hx    Esophageal cancer Neg Hx    Gallbladder disease Neg Hx    Diabetes Neg Hx     Current Medications:  Current Outpatient Medications:    apixaban  (ELIQUIS ) 5 MG TABS tablet, Take 1 tablet (5 mg total) by mouth 2 (two) times daily., Disp: 180 tablet, Rfl: 1   azelastine  (ASTELIN ) 0.1 % nasal spray, USE 1 SPRAY IN EACH NOSTRIL TWICE A DAY, Disp: 60 mL, Rfl: 3   clopidogrel  (PLAVIX ) 75 MG tablet, Take 1 tablet (75 mg total) by mouth daily., Disp: 90 tablet, Rfl: 2   doxazosin  (CARDURA ) 8 MG tablet, Take 1 tablet (8 mg total) by mouth at bedtime., Disp: 30 tablet, Rfl: 0   ezetimibe  (ZETIA ) 10 MG tablet, Take 1 tablet (10 mg total) by mouth daily., Disp: 90 tablet, Rfl: 2   isosorbide  mononitrate (IMDUR ) 30 MG 24 hr tablet, Take 1 tablet (  30 mg total) by mouth daily., Disp: 90 tablet, Rfl: 1   Lactobacillus (PROBIOTIC ACIDOPHILUS) CAPS, Take 1  capsule by mouth daily., Disp: , Rfl:    levothyroxine  (SYNTHROID ) 50 MCG tablet, Take 1 tablet (50 mcg total) by mouth daily before breakfast., Disp: 90 tablet, Rfl: 3   meclizine (ANTIVERT) 25 MG tablet, Take 25 mg by mouth daily as needed for dizziness., Disp: , Rfl:    mirtazapine  (REMERON ) 15 MG tablet, Take 1 tablet (15 mg total) by mouth at bedtime., Disp: 100 tablet, Rfl: 3   Multiple Vitamin (MULTIVITAMIN) tablet, Take 1 tablet by mouth daily., Disp: , Rfl:    pantoprazole  (PROTONIX ) 40 MG tablet, Take 1 tablet (40 mg total) by mouth daily., Disp: 90 tablet, Rfl: 0   ramipril  (ALTACE ) 10 MG capsule, TAKE 1 CAPSULE DAILY, Disp: 90 capsule, Rfl: 3   rosuvastatin  (CRESTOR ) 10 MG tablet, Take 1 tablet (10 mg total) by mouth every other day., Disp: 90 tablet, Rfl: 0   SIMETHICONE  PO, Take 1 tablet by mouth as needed (gas)., Disp: , Rfl:    vitamin C (ASCORBIC ACID) 500 MG tablet, Take 500 mg by mouth daily. , Disp: , Rfl:    Allergies: Allergies  Allergen Reactions   Paxil [Paroxetine Hcl] Palpitations   Codeine Other (See Comments)   Eliquis  [Apixaban ] Other (See Comments)    dizziness   Pravachol [Pravastatin Sodium] Other (See Comments)    myalgias   Zetia  [Ezetimibe ] Other (See Comments)    myalgias   Penicillins Hives, Rash and Other (See Comments)    Happened in 1959   Vytorin [Ezetimibe -Simvastatin] Other (See Comments)    myalgias    REVIEW OF SYSTEMS:   Review of Systems  Constitutional:  Positive for fatigue.  Neurological:  Positive for dizziness.  Psychiatric/Behavioral:  Positive for sleep disturbance.      VITALS:   Blood pressure 123/68, pulse 70, temperature 98.1 F (36.7 C), temperature source Oral, resp. rate 18, weight 150 lb (68 kg), SpO2 99%.  Wt Readings from Last 3 Encounters:  07/16/24 150 lb (68 kg)  05/22/24 147 lb 6 oz (66.8 kg)  05/05/24 154 lb (69.9 kg)    Body mass index is 22.48 kg/m.  Performance status (ECOG): 1 - Symptomatic but  completely ambulatory  PHYSICAL EXAM:   Physical Exam Constitutional:      Appearance: Normal appearance.  Cardiovascular:     Rate and Rhythm: Normal rate and regular rhythm.  Pulmonary:     Effort: Pulmonary effort is normal.     Breath sounds: Normal breath sounds.  Abdominal:     General: Bowel sounds are normal.     Palpations: Abdomen is soft.  Musculoskeletal:        General: No swelling. Normal range of motion.  Neurological:     Mental Status: He is alert and oriented to person, place, and time. Mental status is at baseline.     LABS:      Latest Ref Rng & Units 07/09/2024   10:52 AM 05/05/2024   10:17 AM 04/22/2024    9:21 AM  CBC  WBC 4.0 - 10.5 K/uL 5.6  5.3  5.2   Hemoglobin 13.0 - 17.0 g/dL 87.6  87.3  86.5   Hematocrit 39.0 - 52.0 % 37.2  40.5  41.4   Platelets 150 - 400 K/uL 127  128  121       Latest Ref Rng & Units 04/22/2024    9:21 AM 01/28/2024  6:30 AM 01/27/2024    7:11 AM  CMP  Glucose 70 - 99 mg/dL 82  94  894   BUN 10 - 36 mg/dL 12  5  5    Creatinine 0.76 - 1.27 mg/dL 9.13  9.24  9.20   Sodium 134 - 144 mmol/L 141  136  137   Potassium 3.5 - 5.2 mmol/L 3.7  3.7  3.9   Chloride 96 - 106 mmol/L 101  105  104   CO2 20 - 29 mmol/L 25  22  23    Calcium  8.6 - 10.2 mg/dL 9.8  8.8  9.1   Total Protein 6.0 - 8.5 g/dL 6.4     Total Bilirubin 0.0 - 1.2 mg/dL 0.6     Alkaline Phos 44 - 121 IU/L 117     AST 0 - 40 IU/L 25     ALT 0 - 44 IU/L 17        No results found for: CEA1, CEA / No results found for: CEA1, CEA Lab Results  Component Value Date   PSA1 32.4 (H) 02/22/2021   No results found for: CAN199 No results found for: RJW874  Lab Results  Component Value Date   TOTALPROTELP 7.4 06/24/2023   TOTALPROTELP 6.6 06/24/2023   ALBUMINELP 4.3 06/24/2023   A1GS 0.3 06/24/2023   A2GS 0.7 06/24/2023   BETS 1.1 06/24/2023   GAMS 1.0 06/24/2023   MSPIKE Not Observed 06/24/2023   SPEI Comment 06/24/2023   Lab Results   Component Value Date   TIBC 322 05/05/2024   TIBC 302 06/04/2023   FERRITIN 98 05/05/2024   FERRITIN 136 06/04/2023   FERRITIN 726 (H) 09/16/2019   IRONPCTSAT 16 05/05/2024   IRONPCTSAT 30 06/04/2023   Lab Results  Component Value Date   LDH 129 06/24/2023     STUDIES:   No results found.

## 2024-07-21 ENCOUNTER — Other Ambulatory Visit: Payer: Self-pay | Admitting: Family Medicine

## 2024-07-21 NOTE — Telephone Encounter (Signed)
 Needs to be routed to Dr Sherron Champ with [facility] cardiology please.

## 2024-07-21 NOTE — Telephone Encounter (Signed)
  Prescription Request  07/21/2024  Is this a Controlled Substance medicine? no  Have you seen your PCP in the last 2 weeks? No, apt scheduled for 07/29/2024 for this med refill. Pt will be out in 5 days and wants a refill to last him until apt  If YES, route message to pool  -  If NO, patient needs to be scheduled for appointment.  What is the name of the medication or equipment? isosorbide  mononitrate (IMDUR ) 30 MG 24 hr table--prescribed at the hospital  Have you contacted your pharmacy to request a refill? no   Which pharmacy would you like this sent to? [facility]   Patient notified that their request is being sent to the clinical staff for review and that they should receive a response within 2 business days.

## 2024-07-22 ENCOUNTER — Other Ambulatory Visit: Payer: Self-pay | Admitting: Family Medicine

## 2024-07-29 ENCOUNTER — Ambulatory Visit: Admitting: Family Medicine

## 2024-08-06 ENCOUNTER — Other Ambulatory Visit: Payer: Self-pay | Admitting: Family Medicine

## 2024-08-20 DIAGNOSIS — L84 Corns and callosities: Secondary | ICD-10-CM | POA: Diagnosis not present

## 2024-08-20 DIAGNOSIS — I70203 Unspecified atherosclerosis of native arteries of extremities, bilateral legs: Secondary | ICD-10-CM | POA: Diagnosis not present

## 2024-08-20 DIAGNOSIS — M79675 Pain in left toe(s): Secondary | ICD-10-CM | POA: Diagnosis not present

## 2024-08-20 DIAGNOSIS — B351 Tinea unguium: Secondary | ICD-10-CM | POA: Diagnosis not present

## 2024-08-20 DIAGNOSIS — M79674 Pain in right toe(s): Secondary | ICD-10-CM | POA: Diagnosis not present

## 2024-09-09 ENCOUNTER — Encounter: Payer: Self-pay | Admitting: Family Medicine

## 2024-09-09 ENCOUNTER — Ambulatory Visit: Admitting: Family Medicine

## 2024-09-09 VITALS — BP 121/72 | HR 71 | Temp 98.1°F | Ht 72.0 in | Wt 149.5 lb

## 2024-09-09 DIAGNOSIS — R42 Dizziness and giddiness: Secondary | ICD-10-CM

## 2024-09-09 DIAGNOSIS — E782 Mixed hyperlipidemia: Secondary | ICD-10-CM | POA: Diagnosis not present

## 2024-09-09 DIAGNOSIS — Z23 Encounter for immunization: Secondary | ICD-10-CM

## 2024-09-09 DIAGNOSIS — E441 Mild protein-calorie malnutrition: Secondary | ICD-10-CM

## 2024-09-09 DIAGNOSIS — I482 Chronic atrial fibrillation, unspecified: Secondary | ICD-10-CM | POA: Diagnosis not present

## 2024-09-09 DIAGNOSIS — E039 Hypothyroidism, unspecified: Secondary | ICD-10-CM

## 2024-09-09 NOTE — Progress Notes (Signed)
 Subjective: CC: Weight check PCP: Jolinda Norene HERO, DO HPI:Dalton Vaughan is a 89 y.o. male presenting to clinic today for:  Protein malnutrition/ dizziness Patient here for interval checkup.  We have been closely following his weight due to gradual decline which has been associated with his very marked change in diet over the last year due to history of NSTEMI in February.  He has eliminated many meat products and mostly gets his protein through beans and yogurts.  He tries to maintain a very low cholesterol diet.  He subsequently has lost some muscle mass and weight.  He denies any chest pain, shortness of breath or change in exercise tolerance but does continue to get occasional dizziness.  Has not followed up with neurology for this.  They had planned an MRI of his brain.   ROS: Per HPI  Allergies  Allergen Reactions   Paxil [Paroxetine Hcl] Palpitations   Codeine Other (See Comments)   Eliquis  [Apixaban ] Other (See Comments)    dizziness   Pravachol [Pravastatin Sodium] Other (See Comments)    myalgias   Zetia  [Ezetimibe ] Other (See Comments)    myalgias   Penicillins Hives, Rash and Other (See Comments)    Happened in 1959   Vytorin [Ezetimibe -Simvastatin] Other (See Comments)    myalgias   Past Medical History:  Diagnosis Date   Anal fissure    Arthritis    Atrial fibrillation (HCC)    BPH (benign prostatic hypertrophy)    CAD (coronary artery disease)    Dr. Blanca   Cataract    COVID-19    Depression 2004   Diverticulosis    GERD (gastroesophageal reflux disease)    History of TIAs    Hyperlipidemia    Hypertension    Hypertensive heart disease without CHF    Hypothyroidism    Internal hemorrhoids    Lumbar disc disease    NSTEMI (non-ST elevated myocardial infarction) (HCC) 01/22/2024   Oral bleeding 08/20/2012   Following molar tooth extraction July, 2013   Pneumonia    Ruptured disk 1995   Shingles    Thyroid  disease    Vitamin D  deficiency     Vitreous detachment     Current Outpatient Medications:    apixaban  (ELIQUIS ) 5 MG TABS tablet, TAKE 1 TABLET TWICE A DAY, Disp: 180 tablet, Rfl: 0   azelastine  (ASTELIN ) 0.1 % nasal spray, USE 1 SPRAY IN EACH NOSTRIL TWICE A DAY, Disp: 60 mL, Rfl: 3   clopidogrel  (PLAVIX ) 75 MG tablet, Take 1 tablet (75 mg total) by mouth daily., Disp: 90 tablet, Rfl: 2   doxazosin  (CARDURA ) 8 MG tablet, TAKE ONE TABLET AT BEDTIME, Disp: 30 tablet, Rfl: 0   ezetimibe  (ZETIA ) 10 MG tablet, Take 1 tablet (10 mg total) by mouth daily., Disp: 90 tablet, Rfl: 2   isosorbide  mononitrate (IMDUR ) 30 MG 24 hr tablet, Take 1 tablet (30 mg total) by mouth daily., Disp: 90 tablet, Rfl: 1   Lactobacillus (PROBIOTIC ACIDOPHILUS) CAPS, Take 1 capsule by mouth daily., Disp: , Rfl:    levothyroxine  (SYNTHROID ) 50 MCG tablet, Take 1 tablet (50 mcg total) by mouth daily before breakfast., Disp: 90 tablet, Rfl: 3   meclizine (ANTIVERT) 25 MG tablet, Take 25 mg by mouth daily as needed for dizziness., Disp: , Rfl:    mirtazapine  (REMERON ) 15 MG tablet, Take 1 tablet (15 mg total) by mouth at bedtime., Disp: 100 tablet, Rfl: 3   Multiple Vitamin (MULTIVITAMIN) tablet, Take 1 tablet by mouth daily.,  Disp: , Rfl:    ramipril  (ALTACE ) 10 MG capsule, TAKE 1 CAPSULE DAILY, Disp: 90 capsule, Rfl: 3   rosuvastatin  (CRESTOR ) 10 MG tablet, Take 1 tablet (10 mg total) by mouth every other day., Disp: 90 tablet, Rfl: 0   SIMETHICONE  PO, Take 1 tablet by mouth as needed (gas)., Disp: , Rfl:    vitamin C (ASCORBIC ACID) 500 MG tablet, Take 500 mg by mouth daily. , Disp: , Rfl:    pantoprazole  (PROTONIX ) 40 MG tablet, Take 1 tablet (40 mg total) by mouth daily. (Patient not taking: Reported on 09/09/2024), Disp: 90 tablet, Rfl: 0 Social History   Socioeconomic History   Marital status: Widowed    Spouse name: [name]    Number of children: 1   Years of education: 12   Highest education level: Not on file  Occupational History   Occupation:  Retired    Veterinary surgeon: Company secretary x 27 years   Occupation: Retired    Associate Professor: Public librarian    Comment: supervisor x 17 years  Tobacco Use   Smoking status: Former    Current packs/day: 0.00    Average packs/day: 1 pack/day for 18.0 years (18.0 ttl pk-yrs)    Types: Cigarettes    Start date: 12/17/1948    Quit date: 12/17/1966    Years since quitting: 57.7   Smokeless tobacco: Never  Vaping Use   Vaping status: Never Used  Substance and Sexual Activity   Alcohol use: Yes    Alcohol/week: 1.0 standard drink of alcohol    Types: 1 Standard drinks or equivalent per week    Comment: rare   Drug use: No   Sexual activity: Not Currently  Other Topics Concern   Not on file  Social History Narrative   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[employer]      Right-handed      Caffeine use: none      Son and grandson live with him   Social Drivers of Corporate investment banker Strain: Low Risk  (03/05/2024)   Overall Financial Resource Strain (CARDIA)    Difficulty of Paying Living Expenses: Not very hard  Food Insecurity: No Food Insecurity (03/05/2024)   Hunger Vital Sign    Worried About Running Out of Food in the Last Year: Never true    Ran Out of Food in the Last Year: Never true  Transportation Needs: No Transportation Needs (03/05/2024)   PRAPARE - Administrator, Civil Service (Medical): No    Lack of Transportation (Non-Medical): No  Physical Activity: Sufficiently Active (03/05/2024)   Exercise Vital Sign    Days of Exercise per Week: 7 days    Minutes of Exercise per Session: 70 min  Stress: No Stress Concern Present (03/05/2024)   [employer] of Occupational Health - Occupational Stress Questionnaire    Feeling of Stress : Not at all  Social Connections: Socially Isolated (03/05/2024)   Social Connection and Isolation Panel    Frequency of Communication with Friends and Family: More than three times a week    Frequency of Social  Gatherings with Friends and Family: Once a week    Attends Religious Services: Never    Database administrator or Organizations: No    Attends Banker Meetings: Never    Marital Status: Widowed  Intimate Partner Violence: Not At Risk (03/05/2024)   Humiliation, Afraid, Rape, and Kick questionnaire    Fear of Current or  Ex-Partner: No    Emotionally Abused: No    Physically Abused: No    Sexually Abused: No   Family History  Problem Relation Age of Onset   Dementia Mother    Arthritis Mother    Cancer Brother        prostate   Asthma Sister    Heart disease Paternal Aunt    Heart disease Paternal Uncle    Hypertension Maternal Grandmother    CVA Maternal Grandmother    CVA Paternal Grandmother    Hepatitis C Son    Arthritis Son    Colon cancer Neg Hx    Colon polyps Neg Hx    Kidney disease Neg Hx    Esophageal cancer Neg Hx    Gallbladder disease Neg Hx    Diabetes Neg Hx     Objective: Office vital signs reviewed. BP 121/72   Pulse 71   Temp 98.1 F (36.7 C)   Ht 6' (1.829 m)   Wt 149 lb 8 oz (67.8 kg)   SpO2 95%   BMI 20.28 kg/m   Physical Examination:  General: Awake, alert, thin, No acute distress HEENT: sclera white, MMM Cardio: regular rate and rhythm, S1S2 heard, soft murmurs appreciated Pulm: clear to auscultation bilaterally, no wheezes, rhonchi or rales; normal work of breathing on room air     09/09/2024   11:28 AM 07/16/2024   11:32 AM 05/05/2024    9:52 AM  Depression screen PHQ 2/9  Decreased Interest 0 0 0  Down, Depressed, Hopeless 0 0 0  PHQ - 2 Score 0 0 0  Altered sleeping 0  0  Tired, decreased energy 3  0  Change in appetite 0  0  Feeling bad or failure about yourself  0  0  Trouble concentrating 0  0  Moving slowly or fidgety/restless 0  0  Suicidal thoughts 0  0  PHQ-9 Score 3  0  Difficult doing work/chores Somewhat difficult  Not difficult at all      09/09/2024   11:28 AM 05/05/2024    9:51 AM 02/14/2024    10:06 AM 02/03/2024    9:51 AM  GAD 7 : Generalized Anxiety Score  Nervous, Anxious, on Edge 0 0 0 0  Control/stop worrying 0 0 0 0  Worry too much - different things 0 0 0 0  Trouble relaxing 0 0 0 0  Restless 0 0 0 0  Easily annoyed or irritable 0 0 0 0  Afraid - awful might happen 0 0 0 0  Total GAD 7 Score 0 0 0 0  Anxiety Difficulty Not difficult at all Not difficult at all Not difficult at all Not difficult at all    Assessment/ Plan: 89 y.o. male   Mild protein-calorie malnutrition - Plan: CMP14+EGFR  Chronic atrial fibrillation (HCC) - Plan: CMP14+EGFR  Mixed hyperlipidemia - Plan: CMP14+EGFR, Lipid panel  Acquired hypothyroidism - Plan: CMP14+EGFR, TSH + free T4  Encounter for immunization - Plan: Flu vaccine HIGH DOSE PF(Fluzone Trivalent)  Dizziness   We discussed that he has protein calorie malnutrition and I gave him parameters on how to improve protein intake.  Handout provided.  May consider referral to dietitian if needed going forward.  He actually seem to be rate and rhythm controlled on exam today.  I put future orders in for his fasting labs and we will collect that at her 21-month follow-up for weight.  He will schedule office visit with his neurologist to further  evaluate the chronic dizzy spells he has been getting  Influenza vaccination administered.   Norene CHRISTELLA Fielding, DO [facility] [phone]

## 2024-09-09 NOTE — Patient Instructions (Addendum)
 Aim for 40-50g of protein per day or about 15-20g of protein per meal.  Protein in Different Foods Protein is a nutrient that you get through your diet. Depending on your health, you may need more or less protein in your diet. You should eat a variety of protein foods to make sure that you get all the nutrients you need. Talk with your health care provider about how much protein you need each day. Protein helps your body: Fix and make cells and tissues. Fight infection. Have energy. Grow and develop. What are tips for getting more protein in your diet? Try to replace processed carbohydrates with high-quality protein. Snack on nuts and seeds instead of chips. Replace baked desserts with [place] yogurt. Eat protein foods from both plant and animal sources. Add beans and peas to salads, soups, and side dishes. Include a protein food with each meal and snack. Eat more whole grains. Add powdered milk or protein powder to hot cereals. Add peanut butter to toast or crackers instead of butter. Reading food labels You can find the amount of protein in a food item by looking at the nutrition facts label. Use the total grams listed to help you reach your daily goal. What foods are high in protein?  High-protein foods contain 4 grams (g) or more of protein per serving. They include: Grains Quinoa (cooked) -- 1 cup (185 g) has 8 g of protein. Whole wheat pasta (cooked) -- 1 cup (140 g) has 6 g of protein. Dairy Cottage cheese --  cup (114 g) has 13.4 g of protein. Milk -- 1 cup (237 mL) has 8 g of protein. Cheese (hard) -- 1 oz (28 g) has 7 g of protein. Yogurt, regular -- 6 oz (170 g) has 8 g of protein. Greek yogurt -- 6 oz (200 g) has 18 g protein. Meat Beef, ground sirloin (cooked) -- 3 oz (85 g) has 24 g of protein. Chicken breast, boneless and skinless (cooked) -- 3 oz (85 g) has 25 g of protein. Egg -- 1 egg has 6 g of protein. Fish, filet (cooked) -- 3 oz (85g ) has 21-23 g of  protein. Lamb (cooked) -- 3 oz (85 g) has 24 g of protein. Pork tenderloin (cooked) -- 3 oz (85 g) has 23 g of protein. Tuna (canned in water) -- 3 oz (85 g) has 20 g of protein. Plant protein Garbanzo beans (canned or cooked) --  cup (130 g) has 6-7 g of protein. Kidney beans (canned or cooked) --  cup (130 g) has 6-7 g of protein. Nuts (peanuts, pistachios, almonds) -- 1 oz (28 g) has 6 g of protein. Peanut butter -- 1 oz (32 g) has 7-8 g of protein. Pumpkin seeds -- 1 oz (28 g) has 8.5 g of protein. Soybeans (roasted) -- 1 oz (28 g) has 8 g of protein. Soybeans (cooked) --  cup (90 g) has 11 g of protein. Soy milk -- 1 cup (250 mL) has 5-10 g of protein. Soy or vegetable patty -- 1 patty has 11 g of protein. Sunflower seeds -- 1 oz (28 g) has 5.5 g of protein. Buckwheat -- 1 oz (33 g) has 4.3 g of protein. Tofu (firm) --  cup (124 g) has 20 g of protein. Tempeh --  cup (83 g) has 16 g of protein. The items listed above may not be a complete list of foods that are high in protein. Actual amounts of protein may be different depending on processing.  Talk with an expert in healthy eating called a dietitian to learn more. What foods are low in protein?  Low-protein foods contain 3 grams (g) or less of protein per serving. They include: Fruits Fruit or vegetable juice --  cup (125 mL) has 1 g of protein. Vegetables Beets (raw or cooked) --  cup (68 g) has 1.5 g of protein. Broccoli (raw or cooked) --  cup (44 g) has 2 g of protein. Collard greens (raw or cooked) --  cup (42 g) has 2 g of protein. Green beans (raw or cooked) --  cup (83 g) has 1 g of protein. Green peas (canned) --  cup (80 g) has 3.5 g of protein. Potato (baked with skin) -- 1 medium potato (173 g) has 3 g of protein. Spinach (cooked) --  cup (90 g) has 3 g of protein. Squash (cooked) --  cup (90 g) has 1.5 g of protein. Avocado -- 1 cup (146 g) has 2.7 g of protein. Grains Bran cereal --  cup (30 g) has  3 g of protein. Whole wheat bread -- 1 slice has 3 g of protein. Corn (fresh or cooked) --  cup (77 g) has 2 g of protein. Flour tortilla -- One 6-inch (15 cm) tortilla has 2.5 g of protein. Muffins -- 1 small muffin (2 oz or 57 g) has 3 g of protein. Oatmeal (cooked) --  cup (40 g) has 3 g of protein. Brown rice (cooked) --  cup (78 g) has 2.5 g of protein. Dairy Cream cheese -- 1 oz (29 g) has 2 g of protein. Creamer (half-and-half) -- 1 oz (29 mL) has 1 g of protein. Frozen yogurt --  cup (72 g) has 3 g of protein. Sour cream --  cup (75 g) has 2.5 g of protein. The items listed above may not be a complete list of foods that are low in protein. Actual amounts of protein may be different depending on processing. Talk with an expert in healthy eating to learn more. This information is not intended to replace advice given to you by your health care provider. Make sure you discuss any questions you have with your health care provider. Document Revised: 04/29/2023 Document Reviewed: 04/29/2023 Elsevier Patient Education  2024 [employer].

## 2024-10-30 ENCOUNTER — Ambulatory Visit (HOSPITAL_COMMUNITY)
Admission: RE | Admit: 2024-10-30 | Discharge: 2024-10-30 | Disposition: A | Discharge status: Home / Self Care | Source: Ambulatory Visit | Attending: Surgery | Admitting: Surgery

## 2024-10-30 DIAGNOSIS — R270 Ataxia, unspecified: Secondary | ICD-10-CM | POA: Diagnosis not present

## 2024-10-30 DIAGNOSIS — I6782 Cerebral ischemia: Secondary | ICD-10-CM | POA: Diagnosis not present

## 2024-10-30 DIAGNOSIS — Z8673 Personal history of transient ischemic attack (TIA), and cerebral infarction without residual deficits: Secondary | ICD-10-CM | POA: Diagnosis not present

## 2024-11-06 DIAGNOSIS — Z950 Presence of cardiac pacemaker: Secondary | ICD-10-CM | POA: Diagnosis not present

## 2024-11-06 DIAGNOSIS — I495 Sick sinus syndrome: Secondary | ICD-10-CM | POA: Diagnosis not present

## 2024-11-06 DIAGNOSIS — Z951 Presence of aortocoronary bypass graft: Secondary | ICD-10-CM | POA: Diagnosis not present

## 2024-11-06 DIAGNOSIS — I252 Old myocardial infarction: Secondary | ICD-10-CM | POA: Diagnosis not present

## 2024-11-06 DIAGNOSIS — I2510 Atherosclerotic heart disease of native coronary artery without angina pectoris: Secondary | ICD-10-CM | POA: Diagnosis not present

## 2024-11-09 DIAGNOSIS — R270 Ataxia, unspecified: Secondary | ICD-10-CM | POA: Diagnosis not present

## 2024-11-19 ENCOUNTER — Ambulatory Visit: Attending: Surgery | Admitting: Physical Therapy

## 2024-11-19 ENCOUNTER — Other Ambulatory Visit: Payer: Self-pay

## 2024-11-19 ENCOUNTER — Encounter: Payer: Self-pay | Admitting: Physical Therapy

## 2024-11-19 DIAGNOSIS — M79675 Pain in left toe(s): Secondary | ICD-10-CM | POA: Diagnosis not present

## 2024-11-19 DIAGNOSIS — M6281 Muscle weakness (generalized): Secondary | ICD-10-CM | POA: Diagnosis present

## 2024-11-19 DIAGNOSIS — B351 Tinea unguium: Secondary | ICD-10-CM | POA: Diagnosis not present

## 2024-11-19 DIAGNOSIS — R42 Dizziness and giddiness: Secondary | ICD-10-CM | POA: Diagnosis present

## 2024-11-19 DIAGNOSIS — L84 Corns and callosities: Secondary | ICD-10-CM | POA: Diagnosis not present

## 2024-11-19 DIAGNOSIS — R2689 Other abnormalities of gait and mobility: Secondary | ICD-10-CM | POA: Diagnosis present

## 2024-11-19 DIAGNOSIS — I70203 Unspecified atherosclerosis of native arteries of extremities, bilateral legs: Secondary | ICD-10-CM | POA: Diagnosis not present

## 2024-11-19 DIAGNOSIS — M79674 Pain in right toe(s): Secondary | ICD-10-CM | POA: Diagnosis not present

## 2024-11-19 NOTE — Therapy (Signed)
 OUTPATIENT PHYSICAL THERAPY NEURO EVALUATION   Patient Name: Dalton Vaughan MRN: [id number] DOB:01-17-1929, 89 y.o., male Today's Date: 11/19/2024   PCP: Jolinda Norene HERO, DO REFERRING PROVIDER: Johnanna Camie Mates, PA-C  END OF SESSION:  PT End of Session - 11/19/24 1013     Visit Number 1    Number of Visits 12    Date for Recertification  12/31/24    Authorization Type Medicare    PT Start Time 1015    PT Stop Time 1100    PT Time Calculation (min) 45 min    Activity Tolerance Patient tolerated treatment well          Past Medical History:  Diagnosis Date   Anal fissure    Arthritis    Atrial fibrillation (HCC)    BPH (benign prostatic hypertrophy)    CAD (coronary artery disease)    Dr. Blanca   Cataract    COVID-19    Depression 2004   Diverticulosis    GERD (gastroesophageal reflux disease)    History of TIAs    Hyperlipidemia    Hypertension    Hypertensive heart disease without CHF    Hypothyroidism    Internal hemorrhoids    Lumbar disc disease    NSTEMI (non-ST elevated myocardial infarction) (HCC) 01/22/2024   Oral bleeding 08/20/2012   Following molar tooth extraction July, 2013   Pneumonia    Ruptured disk 1995   Shingles    Thyroid  disease    Vitamin D  deficiency    Vitreous detachment    Past Surgical History:  Procedure Laterality Date   bilateral cataract surgery  6/07   CARDIAC CATHETERIZATION     CORONARY ARTERY BYPASS GRAFT  12/99    Dr. Army DOWNS ANGIOGRAPHY N/A 01/28/2024   Procedure: CORONARY/GRAFT ANGIOGRAPHY;  Surgeon: Elmira Newman PARAS, MD;  Location: [facility];  Service: Cardiovascular;  Laterality: N/A;   cysloscopy  8/08   EYE SURGERY     HERNIA REPAIR  1997   right   herninated disc  1995   PACEMAKER INSERTION  11/03/2020   PROSTATE BIOPSY  8/08   TONSILLECTOMY AND ADENOIDECTOMY     Patient Active Problem List   Diagnosis Date Noted   Ataxia 03/05/2024   Lightheadedness 03/05/2024    Prolonged QT interval 01/22/2024   Acquired hypothyroidism 01/22/2024   Purpura senilis 06/27/2022   Pacemaker 11/03/2020   Essential hypertension 01/25/2020   Osteoarthritis of spine with radiculopathy, lumbar region 01/08/2018   Aortic atherosclerosis 05/02/2017   Thrombocytopenia 07/10/2016   Cervical spondylosis without myelopathy 04/25/2016   Hereditary and idiopathic peripheral neuropathy 10/16/2015   Allergic rhinitis 05/06/2014   Depression 08/03/2013   Sick sinus syndrome (HCC) 07/31/2012   Hypertensive heart disease without CHF    Chronic atrial fibrillation (HCC)    Coronary artery disease involving native coronary artery of native heart with angina pectoris    History of TIAs    GERD (gastroesophageal reflux disease)    Chronic anticoagulation    Benign prostatic hyperplasia    Mixed hyperlipidemia     ONSET DATE: 2 years ago  REFERRING DIAG: R27.0 (ICD-10-CM) - Ataxia, unspecified  THERAPY DIAG:  Dizziness and giddiness  Other abnormalities of gait and mobility  Muscle weakness (generalized)  Rationale for Evaluation and Treatment: Rehabilitation  SUBJECTIVE:  SUBJECTIVE STATEMENT: Pt states it started it out that once in a while he would get twinges of dizziness. Now feels a constant dizziness. Feels okay with sitting but when he gets up and walk feels unsteady. Like a disconnect from my brain to my feet. Had MRI of his neck, checked his arteries and then another MRI of his brain but didn't find anything. Normally uses a cane. Saw ENT who cleared from vertigo. Feels he loses his sense of space -- especially at night. Exercises every day for an hour and a half. Current dizziness 3/10; at worst 8/10 with turning and walking.   Pt accompanied by: self  PERTINENT HISTORY:  Back 1995, heart bypass 1999, pacemaker  PAIN:  Are you having pain? No  PRECAUTIONS: None  RED FLAGS: None   WEIGHT BEARING RESTRICTIONS: No  FALLS: Has patient fallen in last 6 months? No  LIVING ENVIRONMENT: Lives with: lives with their family; son and grandson Lives in: House/apartment Stairs: 3 steps to enter with hand rail Has following equipment at home: Single point cane  PLOF: Independent  PATIENT GOALS: Improve dizziness with walking and turning  OBJECTIVE:  Note: Objective measures were completed at Evaluation unless otherwise noted.  DIAGNOSTIC FINDINGS: MRI 11/09/24: No acute or reversible finding. Moderate chronic small-vessel ischemic changes of the cerebral hemispheric white matter. Old bilateral thalamic infarctions.   COGNITION: Overall cognitive status: Within functional limits for tasks assessed   SENSATION: Neuropathy in bilat feet all the way up to the calf (worse on the L); feels it can go up to the hip   POSTURE: rounded shoulders, forward head, and increased thoracic kyphosis   LOWER EXTREMITY MMT:    MMT Right Eval Left Eval  Hip flexion 4- 4-  Hip extension    Hip abduction 3+ 3+  Hip adduction 4- 4-  Hip internal rotation    Hip external rotation    Knee flexion 4 4  Knee extension 5 5  Ankle dorsiflexion    Ankle plantarflexion    Ankle inversion    Ankle eversion    (Blank rows = not tested)  VESTIBULAR ASSESSMENT:    SYMPTOM BEHAVIOR:  Non-Vestibular symptoms: changes in vision and neck pain  Type of dizziness: Unsteady with head/body turns  Frequency: daily  Duration: constant  Aggravating factors: Induced by motion: occur when walking and turning body quickly  Relieving factors: head stationary, lying supine, and slow movements  Progression of symptoms: worse  OCULOMOTOR EXAM:  Ocular Alignment: normal  Ocular ROM: No Limitations  Spontaneous Nystagmus: none  Gaze-Induced Nystagmus: not completely on target but  no nystagmus  Smooth Pursuits: corrective saccades  Saccades: slow and corrective saccades R>L  FRENZEL - FIXATION SUPRESSED:  Unable to test  VESTIBULAR - OCULAR REFLEX:   Slow VOR: Positive Right and Comment: corrective saccades initially but improved with reps  VOR Cancellation: Normal  Head-Impulse Test: HIT Right: positive HIT Left: negative   POSITIONAL TESTING: (-) in sidelying Dix-Hallpike and horizontal canalith testing   MOTION SENSITIVITY:    Motion Sensitivity Quotient Intensity: 0 = none, 1 = Lightheaded, 2 = Mild, 3 = Moderate, 4 = Severe, 5 = Vomiting  Intensity  1. Sitting to supine   2. Supine to L side   3. Supine to R side   4. Supine to sitting   5. L Hallpike-Dix   6. Up from L    7. R Hallpike-Dix   8. Up from R    9. Sitting, head  tipped to L knee   10. Head up from L  knee   11. Sitting, head  tipped to R knee   12. Head up from R  knee   13. Sitting head turns x5   14.Sitting head nods x5   15. In stance, 180  turn to L    16. In stance, 180  turn to R     OTHOSTATICS: not done  GAIT: Findings: Comments: Mildly widened BOS, diminished bilat step length, mild compensated trendelenburg  FUNCTIONAL TESTS:  MCTSIB: condition 1: 30 sec, condition 2: 30 sec, condition 3: 30 sec, condition 4: 9 sec 5 times sit to stand: 17.28 sec FGA: 20/30  [facility] PT Assessment - 11/19/24 0001       Functional Gait  Assessment   Gait assessed  Yes    Gait Level Surface Walks 20 ft in less than 7 sec but greater than 5.5 sec, uses assistive device, slower speed, mild gait deviations, or deviates 6-10 in outside of the 12 in walkway width.    Change in Gait Speed Able to smoothly change walking speed without loss of balance or gait deviation. Deviate no more than 6 in outside of the 12 in walkway width.    Gait with Horizontal Head Turns Performs head turns smoothly with slight change in gait velocity (eg, minor disruption to smooth gait path), deviates  6-10 in outside 12 in walkway width, or uses an assistive device.    Gait with Vertical Head Turns Performs task with slight change in gait velocity (eg, minor disruption to smooth gait path), deviates 6 - 10 in outside 12 in walkway width or uses assistive device    Gait and Pivot Turn Pivot turns safely within 3 sec and stops quickly with no loss of balance.    Step Over Obstacle Is able to step over one shoe box (4.5 in total height) without changing gait speed. No evidence of imbalance.    Gait with Narrow Base of Support Ambulates less than 4 steps heel to toe or cannot perform without assistance.    Gait with Eyes Closed Walks 20 ft, uses assistive device, slower speed, mild gait deviations, deviates 6-10 in outside 12 in walkway width. Ambulates 20 ft in less than 9 sec but greater than 7 sec.    Ambulating Backwards Walks 20 ft, uses assistive device, slower speed, mild gait deviations, deviates 6-10 in outside 12 in walkway width.    Steps Alternating feet, must use rail.    Total Score 20                                                                                                                                      TREATMENT DATE: 11/19/24 Self care: see education below    PATIENT EDUCATION: Education details: Exam findings, POC, vestibular system and balance Person educated: Patient Education method: Explanation, Demonstration, and Handouts Education comprehension:  verbalized understanding, returned demonstration, and needs further education  HOME EXERCISE PROGRAM: To be initiated  GOALS: Goals reviewed with patient? Yes  SHORT TERM GOALS: Target date: 12/10/2024   Pt will be ind with initial HEP Baseline: Goal status: INITIAL  2.  Pt will report no increase in dizziness with all oculomotor movements Baseline:  Goal status: INITIAL  3.  Pt will report decrease in dizziness by >/=25% Baseline:  Goal status: INITIAL    LONG TERM GOALS: Target date:  12/31/2024   Pt will be ind with management and progression of HEP Baseline:  Goal status: INITIAL  2.  Pt will be able to maintain 30 sec on foam with eyes closed (condition 4 of mCTSIB) to demo improved vestibular integration for balance Baseline: 9 sec Goal status: INITIAL  3.  Pt will have improved FGA to >/=26/30 Baseline: 20 Goal status: INITIAL  4.  Pt will have improved 5x STS to </=13 sec to demo increased functional LE strength for reduced fall risk Baseline:  Goal status: INITIAL  5.  Pt will report improved overall dizziness by >/=50% Baseline:  Goal status: INITIAL   ASSESSMENT:  CLINICAL IMPRESSION: Patient is a 89 y.o. M who was seen today for physical therapy evaluation and treatment for gait training. Pt's balance and gait is impeded by feelings of dizziness. Vestibular assessment was indicative of reduced oculomotor function, gaze stability and decreased vestibular integration with balance testing. No s/s of BPPV. Pt additionally does demo some mild hip and core weakness that is also impacting his overall stability with dynamic movement/gait. Both his 5x STS and his FGA indicate a high fall risk. Pt will benefit from PT to address these deficits to maximize his level of comfort and safety at home and the community.   OBJECTIVE IMPAIRMENTS: Abnormal gait, decreased activity tolerance, decreased balance, decreased coordination, decreased endurance, decreased mobility, difficulty walking, decreased strength, dizziness, improper body mechanics, and postural dysfunction.   ACTIVITY LIMITATIONS: standing, stairs, transfers, and locomotion level  PARTICIPATION LIMITATIONS: driving, community activity, and yard work  PERSONAL FACTORS: Age, Fitness, Past/current experiences, and Time since onset of injury/illness/exacerbation are also affecting patient's functional outcome.   REHAB POTENTIAL: Good  CLINICAL DECISION MAKING: Evolving/moderate complexity  EVALUATION  COMPLEXITY: Moderate  PLAN:  PT FREQUENCY: 2x/week  PT DURATION: 6 weeks  PLANNED INTERVENTIONS: 97164- PT Re-evaluation, 97750- Physical Performance Testing, 97110-Therapeutic exercises, 97530- Therapeutic activity, [id number]- Neuromuscular re-education, 97535- Self Care, [id number]- Manual therapy, [id number]- Gait training, [id number] (1-2 muscles), [id number] (3+ muscles)- Dry Needling, Patient/Family education, Balance training, and Vestibular training  PLAN FOR NEXT SESSION: Initiate gaze stabilization exercises. Work on brewing technologist.    Abdallah Hern April Ma L Zahmir Lalla, PT, DPT 11/19/2024, 12:16 PM

## 2024-11-20 ENCOUNTER — Other Ambulatory Visit: Payer: Self-pay | Admitting: Family Medicine

## 2024-11-20 DIAGNOSIS — F321 Major depressive disorder, single episode, moderate: Secondary | ICD-10-CM

## 2024-11-24 ENCOUNTER — Ambulatory Visit: Admitting: *Deleted

## 2024-11-24 DIAGNOSIS — R42 Dizziness and giddiness: Secondary | ICD-10-CM

## 2024-11-24 DIAGNOSIS — M6281 Muscle weakness (generalized): Secondary | ICD-10-CM

## 2024-11-24 DIAGNOSIS — R2689 Other abnormalities of gait and mobility: Secondary | ICD-10-CM

## 2024-11-24 NOTE — Therapy (Signed)
 OUTPATIENT PHYSICAL THERAPY NEURO EVALUATION   Patient Name: Dalton Vaughan MRN: [id number] DOB:[birth year], 89 y.o., male Today's Date: 11/24/2024   PCP: Jolinda Norene HERO, DO REFERRING PROVIDER: Johnanna Camie Mates, PA-C  END OF SESSION:  PT End of Session - 11/24/24 1022     Visit Number 2    Number of Visits 12    Date for Recertification  12/31/24    Authorization Type Medicare    PT Start Time 1015    PT Stop Time 1100    PT Time Calculation (min) 45 min          Past Medical History:  Diagnosis Date   Anal fissure    Arthritis    Atrial fibrillation (HCC)    BPH (benign prostatic hypertrophy)    CAD (coronary artery disease)    Dr. Blanca   Cataract    COVID-19    Depression 2004   Diverticulosis    GERD (gastroesophageal reflux disease)    History of TIAs    Hyperlipidemia    Hypertension    Hypertensive heart disease without CHF    Hypothyroidism    Internal hemorrhoids    Lumbar disc disease    NSTEMI (non-ST elevated myocardial infarction) (HCC) 01/22/2024   Oral bleeding 08/20/2012   Following molar tooth extraction July, 2013   Pneumonia    Ruptured disk 1995   Shingles    Thyroid  disease    Vitamin D  deficiency    Vitreous detachment    Past Surgical History:  Procedure Laterality Date   bilateral cataract surgery  6/07   CARDIAC CATHETERIZATION     CORONARY ARTERY BYPASS GRAFT  12/99    Dr. Army DOWNS ANGIOGRAPHY N/A 01/28/2024   Procedure: CORONARY/GRAFT ANGIOGRAPHY;  Surgeon: Elmira Newman PARAS, MD;  Location: [facility];  Service: Cardiovascular;  Laterality: N/A;   cysloscopy  8/08   EYE SURGERY     HERNIA REPAIR  1997   right   herninated disc  1995   PACEMAKER INSERTION  11/03/2020   PROSTATE BIOPSY  8/08   TONSILLECTOMY AND ADENOIDECTOMY     Patient Active Problem List   Diagnosis Date Noted   Ataxia 03/05/2024   Lightheadedness 03/05/2024   Prolonged QT interval 01/22/2024   Acquired  hypothyroidism 01/22/2024   Purpura senilis 06/27/2022   Pacemaker 11/03/2020   Essential hypertension 01/25/2020   Osteoarthritis of spine with radiculopathy, lumbar region 01/08/2018   Aortic atherosclerosis 05/02/2017   Thrombocytopenia 07/10/2016   Cervical spondylosis without myelopathy 04/25/2016   Hereditary and idiopathic peripheral neuropathy 10/16/2015   Allergic rhinitis 05/06/2014   Depression 08/03/2013   Sick sinus syndrome (HCC) 07/31/2012   Hypertensive heart disease without CHF    Chronic atrial fibrillation (HCC)    Coronary artery disease involving native coronary artery of native heart with angina pectoris    History of TIAs    GERD (gastroesophageal reflux disease)    Chronic anticoagulation    Benign prostatic hyperplasia    Mixed hyperlipidemia     ONSET DATE: 2 years ago  REFERRING DIAG: R27.0 (ICD-10-CM) - Ataxia, unspecified  THERAPY DIAG:  Dizziness and giddiness  Other abnormalities of gait and mobility  Muscle weakness (generalized)  Rationale for Evaluation and Treatment: Rehabilitation  SUBJECTIVE:  SUBJECTIVE STATEMENT: Pt states it started it out that once in a while he would get twinges of dizziness. Now feels a constant dizziness. Feels okay with sitting but when he gets up and walk feels unsteady. Like a disconnect from my brain to my feet.    Pt accompanied by: self  PERTINENT HISTORY: Back 1995, heart bypass 1999, pacemaker  PAIN:  Are you having pain? No  PRECAUTIONS: None  RED FLAGS: None   WEIGHT BEARING RESTRICTIONS: No  FALLS: Has patient fallen in last 6 months? No  LIVING ENVIRONMENT: Lives with: lives with their family; son and grandson Lives in: House/apartment Stairs: 3 steps to enter with hand rail Has following equipment  at home: Single point cane  PLOF: Independent  PATIENT GOALS: Improve dizziness with walking and turning  OBJECTIVE:  Note: Objective measures were completed at Evaluation unless otherwise noted.  DIAGNOSTIC FINDINGS: MRI 11/09/24: No acute or reversible finding. Moderate chronic small-vessel ischemic changes of the cerebral hemispheric white matter. Old bilateral thalamic infarctions.   COGNITION: Overall cognitive status: Within functional limits for tasks assessed   SENSATION: Neuropathy in bilat feet all the way up to the calf (worse on the L); feels it can go up to the hip   POSTURE: rounded shoulders, forward head, and increased thoracic kyphosis   LOWER EXTREMITY MMT:    MMT Right Eval Left Eval  Hip flexion 4- 4-  Hip extension    Hip abduction 3+ 3+  Hip adduction 4- 4-  Hip internal rotation    Hip external rotation    Knee flexion 4 4  Knee extension 5 5  Ankle dorsiflexion    Ankle plantarflexion    Ankle inversion    Ankle eversion    (Blank rows = not tested)  VESTIBULAR ASSESSMENT:    SYMPTOM BEHAVIOR:  Non-Vestibular symptoms: changes in vision and neck pain  Type of dizziness: Unsteady with head/body turns  Frequency: daily  Duration: constant  Aggravating factors: Induced by motion: occur when walking and turning body quickly  Relieving factors: head stationary, lying supine, and slow movements  Progression of symptoms: worse  OCULOMOTOR EXAM:  Ocular Alignment: normal  Ocular ROM: No Limitations  Spontaneous Nystagmus: none  Gaze-Induced Nystagmus: not completely on target but no nystagmus  Smooth Pursuits: corrective saccades  Saccades: slow and corrective saccades R>L  FRENZEL - FIXATION SUPRESSED:  Unable to test  VESTIBULAR - OCULAR REFLEX:   Slow VOR: Positive Right and Comment: corrective saccades initially but improved with reps  VOR Cancellation: Normal  Head-Impulse Test: HIT Right: positive HIT Left:  negative   POSITIONAL TESTING: (-) in sidelying Dix-Hallpike and horizontal canalith testing   MOTION SENSITIVITY:    Motion Sensitivity Quotient Intensity: 0 = none, 1 = Lightheaded, 2 = Mild, 3 = Moderate, 4 = Severe, 5 = Vomiting  Intensity  1. Sitting to supine   2. Supine to L side   3. Supine to R side   4. Supine to sitting   5. L Hallpike-Dix   6. Up from L    7. R Hallpike-Dix   8. Up from R    9. Sitting, head  tipped to L knee   10. Head up from L  knee   11. Sitting, head  tipped to R knee   12. Head up from R  knee   13. Sitting head turns x5   14.Sitting head nods x5   15. In stance, 180  turn to L  16. In stance, 180  turn to R     OTHOSTATICS: not done  GAIT: Findings: Comments: Mildly widened BOS, diminished bilat step length, mild compensated trendelenburg  FUNCTIONAL TESTS:  MCTSIB: condition 1: 30 sec, condition 2: 30 sec, condition 3: 30 sec, condition 4: 9 sec 5 times sit to stand: 17.28 sec FGA: 20/30                                                                                                                                TREATMENT DATE: 11/24/24                                    EXERCISE LOG  Exercise Repetitions and Resistance Comments  Nustep X 12 mins L3   Rocker board X 3 mins, x 3 mins with head turns   Tandem stance X 3 mins, with head turns x3 mins   Balance beam Side stepping x 3 mins,tandem walking 3 mins   Gait in hallway w/head turns X 2 laps    Eyes closed  X 5  hold 10 secs, with head turnd x 5 each side     Blank cell = exercise not performed today    PATIENT EDUCATION: Education details: Exam findings, POC, vestibular system and balance Person educated: Patient Education method: Explanation, Demonstration, and Handouts Education comprehension: verbalized understanding, returned demonstration, and needs further education  HOME EXERCISE PROGRAM: To be initiated  GOALS: Goals reviewed with patient?  Yes  SHORT TERM GOALS: Target date: 12/10/2024   Pt will be ind with initial HEP Baseline: Goal status: INITIAL  2.  Pt will report no increase in dizziness with all oculomotor movements Baseline:  Goal status: INITIAL  3.  Pt will report decrease in dizziness by >/=25% Baseline:  Goal status: INITIAL    LONG TERM GOALS: Target date: 12/31/2024   Pt will be ind with management and progression of HEP Baseline:  Goal status: INITIAL  2.  Pt will be able to maintain 30 sec on foam with eyes closed (condition 4 of mCTSIB) to demo improved vestibular integration for balance Baseline: 9 sec Goal status: INITIAL  3.  Pt will have improved FGA to >/=26/30 Baseline: 20 Goal status: INITIAL  4.  Pt will have improved 5x STS to </=13 sec to demo increased functional LE strength for reduced fall risk Baseline:  Goal status: INITIAL  5.  Pt will report improved overall dizziness by >/=50% Baseline:  Goal status: INITIAL   ASSESSMENT:  CLINICAL IMPRESSION: Patient arrived today doing fair, but was dizzy. Rx focused on Therex and balance act.s static as well as dynamic with eyes closed and with head motions.   OBJECTIVE IMPAIRMENTS: Abnormal gait, decreased activity tolerance, decreased balance, decreased coordination, decreased endurance, decreased mobility, difficulty walking, decreased strength, dizziness, improper body mechanics, and postural dysfunction.   ACTIVITY LIMITATIONS:  standing, stairs, transfers, and locomotion level  PARTICIPATION LIMITATIONS: driving, community activity, and yard work  PERSONAL FACTORS: Age, Fitness, Past/current experiences, and Time since onset of injury/illness/exacerbation are also affecting patient's functional outcome.   REHAB POTENTIAL: Good  CLINICAL DECISION MAKING: Evolving/moderate complexity  EVALUATION COMPLEXITY: Moderate  PLAN:  PT FREQUENCY: 2x/week  PT DURATION: 6 weeks  PLANNED INTERVENTIONS: 97164- PT  Re-evaluation, 97750- Physical Performance Testing, 97110-Therapeutic exercises, 97530- Therapeutic activity, [id number]- Neuromuscular re-education, 97535- Self Care, [id number]- Manual therapy, [id number]- Gait training, [phone] (1-2 muscles), [id number] (3+ muscles)- Dry Needling, Patient/Family education, Balance training, and Vestibular training  PLAN FOR NEXT SESSION: Initiate gaze stabilization exercises. Work on brewing technologist.    Nyjai Graff,CHRIS, PTA,  11/24/2024, 12:09 PM

## 2024-11-27 ENCOUNTER — Ambulatory Visit

## 2024-11-27 DIAGNOSIS — R2689 Other abnormalities of gait and mobility: Secondary | ICD-10-CM

## 2024-11-27 DIAGNOSIS — R42 Dizziness and giddiness: Secondary | ICD-10-CM

## 2024-11-27 DIAGNOSIS — M6281 Muscle weakness (generalized): Secondary | ICD-10-CM

## 2024-11-27 NOTE — Therapy (Signed)
 OUTPATIENT PHYSICAL THERAPY NEURO TREATMENT   Patient Name: Dalton Vaughan MRN: [id number] DOB:[birth year], 89 y.o., male Today's Date: 11/27/2024   PCP: Jolinda Norene HERO, DO REFERRING PROVIDER: Johnanna Camie Mates, PA-C  END OF SESSION:  PT End of Session - 11/27/24 1020     Visit Number 3    Number of Visits 12    Date for Recertification  12/31/24    Authorization Type Medicare    PT Start Time 1015    PT Stop Time 1059    PT Time Calculation (min) 44 min          Past Medical History:  Diagnosis Date   Anal fissure    Arthritis    Atrial fibrillation (HCC)    BPH (benign prostatic hypertrophy)    CAD (coronary artery disease)    Dr. Blanca   Cataract    COVID-19    Depression 2004   Diverticulosis    GERD (gastroesophageal reflux disease)    History of TIAs    Hyperlipidemia    Hypertension    Hypertensive heart disease without CHF    Hypothyroidism    Internal hemorrhoids    Lumbar disc disease    NSTEMI (non-ST elevated myocardial infarction) (HCC) 01/22/2024   Oral bleeding 08/20/2012   Following molar tooth extraction July, 2013   Pneumonia    Ruptured disk 1995   Shingles    Thyroid  disease    Vitamin D  deficiency    Vitreous detachment    Past Surgical History:  Procedure Laterality Date   bilateral cataract surgery  6/07   CARDIAC CATHETERIZATION     CORONARY ARTERY BYPASS GRAFT  12/99    Dr. Army DOWNS ANGIOGRAPHY N/A 01/28/2024   Procedure: CORONARY/GRAFT ANGIOGRAPHY;  Surgeon: Elmira Newman PARAS, MD;  Location: [facility];  Service: Cardiovascular;  Laterality: N/A;   cysloscopy  8/08   EYE SURGERY     HERNIA REPAIR  1997   right   herninated disc  1995   PACEMAKER INSERTION  11/03/2020   PROSTATE BIOPSY  8/08   TONSILLECTOMY AND ADENOIDECTOMY     Patient Active Problem List   Diagnosis Date Noted   Ataxia 03/05/2024   Lightheadedness 03/05/2024   Prolonged QT interval 01/22/2024   Acquired  hypothyroidism 01/22/2024   Purpura senilis 06/27/2022   Pacemaker 11/03/2020   Essential hypertension 01/25/2020   Osteoarthritis of spine with radiculopathy, lumbar region 01/08/2018   Aortic atherosclerosis 05/02/2017   Thrombocytopenia 07/10/2016   Cervical spondylosis without myelopathy 04/25/2016   Hereditary and idiopathic peripheral neuropathy 10/16/2015   Allergic rhinitis 05/06/2014   Depression 08/03/2013   Sick sinus syndrome (HCC) 07/31/2012   Hypertensive heart disease without CHF    Chronic atrial fibrillation (HCC)    Coronary artery disease involving native coronary artery of native heart with angina pectoris    History of TIAs    GERD (gastroesophageal reflux disease)    Chronic anticoagulation    Benign prostatic hyperplasia    Mixed hyperlipidemia     ONSET DATE: 2 years ago  REFERRING DIAG: R27.0 (ICD-10-CM) - Ataxia, unspecified  THERAPY DIAG:  Dizziness and giddiness  Other abnormalities of gait and mobility  Muscle weakness (generalized)  Rationale for Evaluation and Treatment: Rehabilitation  SUBJECTIVE:  SUBJECTIVE STATEMENT: Pt denies any pain today.  Pt accompanied by: self  PERTINENT HISTORY: Back 1995, heart bypass 1999, pacemaker  PAIN:  Are you having pain? No  PRECAUTIONS: None  RED FLAGS: None   WEIGHT BEARING RESTRICTIONS: No  FALLS: Has patient fallen in last 6 months? No  LIVING ENVIRONMENT: Lives with: lives with their family; son and grandson Lives in: House/apartment Stairs: 3 steps to enter with hand rail Has following equipment at home: Single point cane  PLOF: Independent  PATIENT GOALS: Improve dizziness with walking and turning  OBJECTIVE:  Note: Objective measures were completed at Evaluation unless otherwise  noted.  DIAGNOSTIC FINDINGS: MRI 11/09/24: No acute or reversible finding. Moderate chronic small-vessel ischemic changes of the cerebral hemispheric white matter. Old bilateral thalamic infarctions.   COGNITION: Overall cognitive status: Within functional limits for tasks assessed   SENSATION: Neuropathy in bilat feet all the way up to the calf (worse on the L); feels it can go up to the hip   POSTURE: rounded shoulders, forward head, and increased thoracic kyphosis   LOWER EXTREMITY MMT:    MMT Right Eval Left Eval  Hip flexion 4- 4-  Hip extension    Hip abduction 3+ 3+  Hip adduction 4- 4-  Hip internal rotation    Hip external rotation    Knee flexion 4 4  Knee extension 5 5  Ankle dorsiflexion    Ankle plantarflexion    Ankle inversion    Ankle eversion    (Blank rows = not tested)  VESTIBULAR ASSESSMENT:    SYMPTOM BEHAVIOR:  Non-Vestibular symptoms: changes in vision and neck pain  Type of dizziness: Unsteady with head/body turns  Frequency: daily  Duration: constant  Aggravating factors: Induced by motion: occur when walking and turning body quickly  Relieving factors: head stationary, lying supine, and slow movements  Progression of symptoms: worse  OCULOMOTOR EXAM:  Ocular Alignment: normal  Ocular ROM: No Limitations  Spontaneous Nystagmus: none  Gaze-Induced Nystagmus: not completely on target but no nystagmus  Smooth Pursuits: corrective saccades  Saccades: slow and corrective saccades R>L  FRENZEL - FIXATION SUPRESSED:  Unable to test  VESTIBULAR - OCULAR REFLEX:   Slow VOR: Positive Right and Comment: corrective saccades initially but improved with reps  VOR Cancellation: Normal  Head-Impulse Test: HIT Right: positive HIT Left: negative   POSITIONAL TESTING: (-) in sidelying Dix-Hallpike and horizontal canalith testing   MOTION SENSITIVITY:    Motion Sensitivity Quotient Intensity: 0 = none, 1 = Lightheaded, 2 = Mild, 3 = Moderate, 4  = Severe, 5 = Vomiting  Intensity  1. Sitting to supine   2. Supine to L side   3. Supine to R side   4. Supine to sitting   5. L Hallpike-Dix   6. Up from L    7. R Hallpike-Dix   8. Up from R    9. Sitting, head  tipped to L knee   10. Head up from L  knee   11. Sitting, head  tipped to R knee   12. Head up from R  knee   13. Sitting head turns x5   14.Sitting head nods x5   15. In stance, 180  turn to L    16. In stance, 180  turn to R     OTHOSTATICS: not done  GAIT: Findings: Comments: Mildly widened BOS, diminished bilat step length, mild compensated trendelenburg  FUNCTIONAL TESTS:  MCTSIB: condition 1: 30 sec, condition 2:  30 sec, condition 3: 30 sec, condition 4: 9 sec 5 times sit to stand: 17.28 sec FGA: 20/30                                                                                                                                TREATMENT DATE:    11/27/24                                EXERCISE LOG  Exercise Repetitions and Resistance Comments  Nustep Lvl 3 x 15 mins   Rockerboard 4 mins   Standing Marches Airex x 2 mins   Tandem Gait Airex x 4 mins   Sidestepping Airex x 4 mins   LAQs 4# x 25 reps bil   Seated Marches 4# x 25 reps bil   Seated [place] x 25 reps bil    Blank cell = exercise not performed today   11/24/24                                   EXERCISE LOG  Exercise Repetitions and Resistance Comments  Nustep X 12 mins L3   Rocker board X 3 mins, x 3 mins with head turns   Tandem stance X 3 mins, with head turns x3 mins   Balance beam Side stepping x 3 mins,tandem walking 3 mins   Gait in hallway w/head turns X 2 laps    Eyes closed  X 5  hold 10 secs, with head turnd x 5 each side     Blank cell = exercise not performed today    PATIENT EDUCATION: Education details: Exam findings, POC, vestibular system and balance Person educated: Patient Education method: Explanation, Demonstration, and Handouts Education  comprehension: verbalized understanding, returned demonstration, and needs further education  HOME EXERCISE PROGRAM: To be initiated  GOALS: Goals reviewed with patient? Yes  SHORT TERM GOALS: Target date: 12/10/2024   Pt will be ind with initial HEP Baseline: Goal status: INITIAL  2.  Pt will report no increase in dizziness with all oculomotor movements Baseline:  Goal status: INITIAL  3.  Pt will report decrease in dizziness by >/=25% Baseline:  Goal status: INITIAL    LONG TERM GOALS: Target date: 12/31/2024   Pt will be ind with management and progression of HEP Baseline:  Goal status: INITIAL  2.  Pt will be able to maintain 30 sec on foam with eyes closed (condition 4 of mCTSIB) to demo improved vestibular integration for balance Baseline: 9 sec Goal status: INITIAL  3.  Pt will have improved FGA to >/=26/30 Baseline: 20 Goal status: INITIAL  4.  Pt will have improved 5x STS to </=13 sec to demo increased functional LE strength for reduced fall risk Baseline:  Goal status: INITIAL  5.  Pt will  report improved overall dizziness by >/=50% Baseline:  Goal status: INITIAL   ASSESSMENT:  CLINICAL IMPRESSION: Pt arrives for today's treatment session denying any pain and reports feeling pretty good.  Pt introduced to several new standing balance exercises today with use of Airex pad.  Pt requiring intermittent UE support for safety and stability.  Pt also introduces to seated bil LE strengthening exercises with min cues for proper technique and posture.  Pt denied any pain at completion of today's treatment session.  OBJECTIVE IMPAIRMENTS: Abnormal gait, decreased activity tolerance, decreased balance, decreased coordination, decreased endurance, decreased mobility, difficulty walking, decreased strength, dizziness, improper body mechanics, and postural dysfunction.   ACTIVITY LIMITATIONS: standing, stairs, transfers, and locomotion level  PARTICIPATION  LIMITATIONS: driving, community activity, and yard work  PERSONAL FACTORS: Age, Fitness, Past/current experiences, and Time since onset of injury/illness/exacerbation are also affecting patient's functional outcome.   REHAB POTENTIAL: Good  CLINICAL DECISION MAKING: Evolving/moderate complexity  EVALUATION COMPLEXITY: Moderate  PLAN:  PT FREQUENCY: 2x/week  PT DURATION: 6 weeks  PLANNED INTERVENTIONS: 97164- PT Re-evaluation, 97750- Physical Performance Testing, 97110-Therapeutic exercises, 97530- Therapeutic activity, [id number]- Neuromuscular re-education, 97535- Self Care, 02859- Manual therapy, [id number]- Gait training, [phone] (1-2 muscles), 20561 (3+ muscles)- Dry Needling, Patient/Family education, Balance training, and Vestibular training  PLAN FOR NEXT SESSION: Initiate gaze stabilization exercises. Work on brewing technologist.    Delon DELENA Gosling, PTA,  11/27/2024, 11:10 AM

## 2024-11-30 ENCOUNTER — Encounter: Payer: Self-pay | Admitting: Physical Therapy

## 2024-11-30 ENCOUNTER — Ambulatory Visit: Admitting: Physical Therapy

## 2024-11-30 DIAGNOSIS — M6281 Muscle weakness (generalized): Secondary | ICD-10-CM

## 2024-11-30 DIAGNOSIS — R42 Dizziness and giddiness: Secondary | ICD-10-CM

## 2024-11-30 DIAGNOSIS — R2689 Other abnormalities of gait and mobility: Secondary | ICD-10-CM

## 2024-11-30 NOTE — Therapy (Addendum)
 OUTPATIENT PHYSICAL THERAPY NEURO TREATMENT   Patient Name: Dalton Vaughan MRN: [id number] DOB:[birth year], 89 y.o., male Today's Date: 11/30/2024   PCP: Jolinda Norene HERO, DO REFERRING PROVIDER: Johnanna Camie Mates, PA-C  END OF SESSION:  PT End of Session - 11/30/24 0933     Visit Number 4    Number of Visits 12    Date for Recertification  12/31/24    Authorization Type Medicare    PT Start Time 0932    PT Stop Time 1010    PT Time Calculation (min) 38 min    Activity Tolerance Patient tolerated treatment well           Past Medical History:  Diagnosis Date   Anal fissure    Arthritis    Atrial fibrillation (HCC)    BPH (benign prostatic hypertrophy)    CAD (coronary artery disease)    Dr. Blanca   Cataract    COVID-19    Depression 2004   Diverticulosis    GERD (gastroesophageal reflux disease)    History of TIAs    Hyperlipidemia    Hypertension    Hypertensive heart disease without CHF    Hypothyroidism    Internal hemorrhoids    Lumbar disc disease    NSTEMI (non-ST elevated myocardial infarction) (HCC) 01/22/2024   Oral bleeding 08/20/2012   Following molar tooth extraction July, 2013   Pneumonia    Ruptured disk 1995   Shingles    Thyroid  disease    Vitamin D  deficiency    Vitreous detachment    Past Surgical History:  Procedure Laterality Date   bilateral cataract surgery  6/07   CARDIAC CATHETERIZATION     CORONARY ARTERY BYPASS GRAFT  12/99    Dr. Army DOWNS ANGIOGRAPHY N/A 01/28/2024   Procedure: CORONARY/GRAFT ANGIOGRAPHY;  Surgeon: Elmira Newman PARAS, MD;  Location: [facility];  Service: Cardiovascular;  Laterality: N/A;   cysloscopy  8/08   EYE SURGERY     HERNIA REPAIR  1997   right   herninated disc  1995   PACEMAKER INSERTION  11/03/2020   PROSTATE BIOPSY  8/08   TONSILLECTOMY AND ADENOIDECTOMY     Patient Active Problem List   Diagnosis Date Noted   Ataxia 03/05/2024   Lightheadedness  03/05/2024   Prolonged QT interval 01/22/2024   Acquired hypothyroidism 01/22/2024   Purpura senilis 06/27/2022   Pacemaker 11/03/2020   Essential hypertension 01/25/2020   Osteoarthritis of spine with radiculopathy, lumbar region 01/08/2018   Aortic atherosclerosis 05/02/2017   Thrombocytopenia 07/10/2016   Cervical spondylosis without myelopathy 04/25/2016   Hereditary and idiopathic peripheral neuropathy 10/16/2015   Allergic rhinitis 05/06/2014   Depression 08/03/2013   Sick sinus syndrome (HCC) 07/31/2012   Hypertensive heart disease without CHF    Chronic atrial fibrillation (HCC)    Coronary artery disease involving native coronary artery of native heart with angina pectoris    History of TIAs    GERD (gastroesophageal reflux disease)    Chronic anticoagulation    Benign prostatic hyperplasia    Mixed hyperlipidemia     ONSET DATE: 2 years ago  REFERRING DIAG: R27.0 (ICD-10-CM) - Ataxia, unspecified  THERAPY DIAG:  Dizziness and giddiness  Other abnormalities of gait and mobility  Muscle weakness (generalized)  Rationale for Evaluation and Treatment: Rehabilitation  SUBJECTIVE:  SUBJECTIVE STATEMENT: Pt states dizziness is about the same.   Pt accompanied by: self  PERTINENT HISTORY: Back 1995, heart bypass 1999, pacemaker  PAIN:  Are you having pain? No  PRECAUTIONS: None  RED FLAGS: None   WEIGHT BEARING RESTRICTIONS: No  FALLS: Has patient fallen in last 6 months? No  LIVING ENVIRONMENT: Lives with: lives with their family; son and grandson Lives in: House/apartment Stairs: 3 steps to enter with hand rail Has following equipment at home: Single point cane  PLOF: Independent  PATIENT GOALS: Improve dizziness with walking and turning  OBJECTIVE:  Note:  Objective measures were completed at Evaluation unless otherwise noted.  DIAGNOSTIC FINDINGS: MRI 11/09/24: No acute or reversible finding. Moderate chronic small-vessel ischemic changes of the cerebral hemispheric white matter. Old bilateral thalamic infarctions.   COGNITION: Overall cognitive status: Within functional limits for tasks assessed   SENSATION: Neuropathy in bilat feet all the way up to the calf (worse on the L); feels it can go up to the hip   POSTURE: rounded shoulders, forward head, and increased thoracic kyphosis   LOWER EXTREMITY MMT:    MMT Right Eval Left Eval  Hip flexion 4- 4-  Hip extension    Hip abduction 3+ 3+  Hip adduction 4- 4-  Hip internal rotation    Hip external rotation    Knee flexion 4 4  Knee extension 5 5  Ankle dorsiflexion    Ankle plantarflexion    Ankle inversion    Ankle eversion    (Blank rows = not tested)  VESTIBULAR ASSESSMENT:    SYMPTOM BEHAVIOR:  Non-Vestibular symptoms: changes in vision and neck pain  Type of dizziness: Unsteady with head/body turns  Frequency: daily  Duration: constant  Aggravating factors: Induced by motion: occur when walking and turning body quickly  Relieving factors: head stationary, lying supine, and slow movements  Progression of symptoms: worse  OCULOMOTOR EXAM:  Ocular Alignment: normal  Ocular ROM: No Limitations  Spontaneous Nystagmus: none  Gaze-Induced Nystagmus: not completely on target but no nystagmus  Smooth Pursuits: corrective saccades  Saccades: slow and corrective saccades R>L  FRENZEL - FIXATION SUPRESSED:  Unable to test  VESTIBULAR - OCULAR REFLEX:   Slow VOR: Positive Right and Comment: corrective saccades initially but improved with reps  VOR Cancellation: Normal  Head-Impulse Test: HIT Right: positive HIT Left: negative   POSITIONAL TESTING: (-) in sidelying Dix-Hallpike and horizontal canalith testing   MOTION SENSITIVITY:    Motion Sensitivity  Quotient Intensity: 0 = none, 1 = Lightheaded, 2 = Mild, 3 = Moderate, 4 = Severe, 5 = Vomiting  Intensity  1. Sitting to supine   2. Supine to L side   3. Supine to R side   4. Supine to sitting   5. L Hallpike-Dix   6. Up from L    7. R Hallpike-Dix   8. Up from R    9. Sitting, head  tipped to L knee   10. Head up from L  knee   11. Sitting, head  tipped to R knee   12. Head up from R  knee   13. Sitting head turns x5   14.Sitting head nods x5   15. In stance, 180  turn to L    16. In stance, 180  turn to R     OTHOSTATICS: not done  GAIT: Findings: Comments: Mildly widened BOS, diminished bilat step length, mild compensated trendelenburg  FUNCTIONAL TESTS:  MCTSIB: condition 1: 30  sec, condition 2: 30 sec, condition 3: 30 sec, condition 4: 9 sec 5 times sit to stand: 17.28 sec FGA: 20/30                                                                                                                                TREATMENT DATE:  11/30/24                                EXERCISE LOG  Exercise Repetitions and Resistance Comments  Nustep Lvl 3 x 10 mins   Smooth pursuit: horizontal and vertical 2x30 each Standing feet together  Saccades: horizontal and vertical 2x30 each Standing feet together  VOR x 1 horizontal and vertical 2x30 each Standing feet together  Feet together eyes closed head turns and head nods 2x30   Feet together eyes closed static standing 2x30   Staggered stance on airex pad with 4# diagonal chops 2x10 each side LOB to the R  Standing feet together on airex with 4# UE flexion 2x10 LOB       Blank cell = exercise not performed today    11/27/24                                EXERCISE LOG  Exercise Repetitions and Resistance Comments  Nustep Lvl 3 x 15 mins   Rockerboard 4 mins   Standing Marches Airex x 2 mins   Tandem Gait Airex x 4 mins   Sidestepping Airex x 4 mins   LAQs 4# x 25 reps bil   Seated Marches 4# x 25 reps bil    Seated Ham Curls Green x 25 reps bil    Blank cell = exercise not performed today   11/24/24                                   EXERCISE LOG  Exercise Repetitions and Resistance Comments  Nustep X 12 mins L3   Rocker board X 3 mins, x 3 mins with head turns   Tandem stance X 3 mins, with head turns x3 mins   Balance beam Side stepping x 3 mins,tandem walking 3 mins   Gait in hallway w/head turns X 2 laps    Eyes closed  X 5  hold 10 secs, with head turnd x 5 each side     Blank cell = exercise not performed today    PATIENT EDUCATION: Education details: HEP updates Person educated: Patient Education method: Explanation, Demonstration, and Handouts Education comprehension: verbalized understanding, returned demonstration, and needs further education  HOME EXERCISE PROGRAM: Access Code: [facility] URL: [email or web address] Date: 11/30/2024 Prepared by: Amali Uhls April Earnie Starring  Exercises - Standing Feet Together Smooth Pursuit  - 2 x daily - 7  x weekly - 2 sets - 30 sec hold - Standing Horizontal Saccades  - 2 x daily - 7 x weekly - 2 sets - 30 sec hold - Standing Vertical Saccades  - 2 x daily - 7 x weekly - 2 sets - 30 sec hold - Gaze Stability (VOR) x1 Feet Together on Firm Ground With Horizontal Head Turns  - 2 x daily - 7 x weekly - 2 sets - 30 sec hold - Gaze Stability (VOR) x1 Feet Together on Firm Ground With Vertical Head Turns  - 2 x daily - 7 x weekly - 2 sets - 30 sec hold  GOALS: Goals reviewed with patient? Yes  SHORT TERM GOALS: Target date: 12/10/2024   Pt will be ind with initial HEP Baseline: Goal status: INITIAL  2.  Pt will report no increase in dizziness with all oculomotor movements Baseline:  Goal status: INITIAL  3.  Pt will report decrease in dizziness by >/=25% Baseline:  Goal status: INITIAL    LONG TERM GOALS: Target date: 12/31/2024   Pt will be ind with management and progression of HEP Baseline:  Goal status:  INITIAL  2.  Pt will be able to maintain 30 sec on foam with eyes closed (condition 4 of mCTSIB) to demo improved vestibular integration for balance Baseline: 9 sec Goal status: INITIAL  3.  Pt will have improved FGA to >/=26/30 Baseline: 20 Goal status: INITIAL  4.  Pt will have improved 5x STS to </=13 sec to demo increased functional LE strength for reduced fall risk Baseline:  Goal status: INITIAL  5.  Pt will report improved overall dizziness by >/=50% Baseline:  Goal status: INITIAL   ASSESSMENT:  CLINICAL IMPRESSION: Challenged pt's oculomotor and vestibular system this session with good pt tolerance. Included core and trunk strengthening on compliant surface to further challenge reactive balance and stability.   OBJECTIVE IMPAIRMENTS: Abnormal gait, decreased activity tolerance, decreased balance, decreased coordination, decreased endurance, decreased mobility, difficulty walking, decreased strength, dizziness, improper body mechanics, and postural dysfunction.   ACTIVITY LIMITATIONS: standing, stairs, transfers, and locomotion level  PARTICIPATION LIMITATIONS: driving, community activity, and yard work  PERSONAL FACTORS: Age, Fitness, Past/current experiences, and Time since onset of injury/illness/exacerbation are also affecting patient's functional outcome.   REHAB POTENTIAL: Good  CLINICAL DECISION MAKING: Evolving/moderate complexity  EVALUATION COMPLEXITY: Moderate  PLAN:  PT FREQUENCY: 2x/week  PT DURATION: 6 weeks  PLANNED INTERVENTIONS: [id number]- PT Re-evaluation, 97750- Physical Performance Testing, 97110-Therapeutic exercises, 97530- Therapeutic activity, [id number]- Neuromuscular re-education, 97535- Self Care, [id number]- Manual therapy, [id number]- Gait training, [phone] (1-2 muscles), [id number] (3+ muscles)- Dry Needling, Patient/Family education, Balance training, and Vestibular training  PLAN FOR NEXT SESSION: Progress oculomotor and gaze stabilization exercises. Work  on brewing technologist.    Connery Shiffler April Ma L Anvika Gashi, PT,  11/30/2024, 9:33 AM

## 2024-12-01 DIAGNOSIS — N319 Neuromuscular dysfunction of bladder, unspecified: Secondary | ICD-10-CM | POA: Diagnosis not present

## 2024-12-04 ENCOUNTER — Ambulatory Visit: Admitting: Physical Therapy

## 2024-12-08 ENCOUNTER — Ambulatory Visit

## 2024-12-08 ENCOUNTER — Encounter: Payer: Self-pay | Admitting: Physical Therapy

## 2024-12-08 DIAGNOSIS — R2689 Other abnormalities of gait and mobility: Secondary | ICD-10-CM

## 2024-12-08 DIAGNOSIS — M6281 Muscle weakness (generalized): Secondary | ICD-10-CM

## 2024-12-08 DIAGNOSIS — R42 Dizziness and giddiness: Secondary | ICD-10-CM

## 2024-12-08 NOTE — Therapy (Signed)
 " OUTPATIENT PHYSICAL THERAPY NEURO TREATMENT   Patient Name: Dalton Vaughan MRN: [id number] DOB:03/25/1929, 89 y.o., male Today's Date: 12/08/2024   PCP: Jolinda Norene HERO, DO REFERRING PROVIDER: Johnanna Camie Mates, PA-C  END OF SESSION:  PT End of Session - 12/08/24 0926     Visit Number 5    Number of Visits 12    Date for Recertification  12/31/24    Authorization Type Medicare    PT Start Time 0930    PT Stop Time 1010    PT Time Calculation (min) 40 min    Activity Tolerance Patient tolerated treatment well            Past Medical History:  Diagnosis Date   Anal fissure    Arthritis    Atrial fibrillation (HCC)    BPH (benign prostatic hypertrophy)    CAD (coronary artery disease)    Dr. Blanca   Cataract    COVID-19    Depression 2004   Diverticulosis    GERD (gastroesophageal reflux disease)    History of TIAs    Hyperlipidemia    Hypertension    Hypertensive heart disease without CHF    Hypothyroidism    Internal hemorrhoids    Lumbar disc disease    NSTEMI (non-ST elevated myocardial infarction) (HCC) 01/22/2024   Oral bleeding 08/20/2012   Following molar tooth extraction July, 2013   Pneumonia    Ruptured disk 1995   Shingles    Thyroid  disease    Vitamin D  deficiency    Vitreous detachment    Past Surgical History:  Procedure Laterality Date   bilateral cataract surgery  6/07   CARDIAC CATHETERIZATION     CORONARY ARTERY BYPASS GRAFT  12/99    Dr. Army DOWNS ANGIOGRAPHY N/A 01/28/2024   Procedure: CORONARY/GRAFT ANGIOGRAPHY;  Surgeon: Elmira Newman PARAS, MD;  Location: [facility];  Service: Cardiovascular;  Laterality: N/A;   cysloscopy  8/08   EYE SURGERY     HERNIA REPAIR  1997   right   herninated disc  1995   PACEMAKER INSERTION  11/03/2020   PROSTATE BIOPSY  8/08   TONSILLECTOMY AND ADENOIDECTOMY     Patient Active Problem List   Diagnosis Date Noted   Ataxia 03/05/2024   Lightheadedness  03/05/2024   Prolonged QT interval 01/22/2024   Acquired hypothyroidism 01/22/2024   Purpura senilis 06/27/2022   Pacemaker 11/03/2020   Essential hypertension 01/25/2020   Osteoarthritis of spine with radiculopathy, lumbar region 01/08/2018   Aortic atherosclerosis 05/02/2017   Thrombocytopenia 07/10/2016   Cervical spondylosis without myelopathy 04/25/2016   Hereditary and idiopathic peripheral neuropathy 10/16/2015   Allergic rhinitis 05/06/2014   Depression 08/03/2013   Sick sinus syndrome (HCC) 07/31/2012   Hypertensive heart disease without CHF    Chronic atrial fibrillation (HCC)    Coronary artery disease involving native coronary artery of native heart with angina pectoris    History of TIAs    GERD (gastroesophageal reflux disease)    Chronic anticoagulation    Benign prostatic hyperplasia    Mixed hyperlipidemia     ONSET DATE: 2 years ago  REFERRING DIAG: R27.0 (ICD-10-CM) - Ataxia, unspecified  THERAPY DIAG:  Dizziness and giddiness  Other abnormalities of gait and mobility  Muscle weakness (generalized)  Rationale for Evaluation and Treatment: Rehabilitation  SUBJECTIVE:  SUBJECTIVE STATEMENT: Pt states he's been doing his exercises -- gets a little dizzy with them and then it tapers off. Dizziness is low this morning. Still doesn't feel right with his walking balance.  Pt accompanied by: self  PERTINENT HISTORY: Back 1995, heart bypass 1999, pacemaker  PAIN:  Are you having pain? No  PRECAUTIONS: None  RED FLAGS: None   WEIGHT BEARING RESTRICTIONS: No  FALLS: Has patient fallen in last 6 months? No  LIVING ENVIRONMENT: Lives with: lives with their family; son and grandson Lives in: House/apartment Stairs: 3 steps to enter with hand rail Has following  equipment at home: Single point cane  PLOF: Independent  PATIENT GOALS: Improve dizziness with walking and turning  OBJECTIVE:  Note: Objective measures were completed at Evaluation unless otherwise noted.  DIAGNOSTIC FINDINGS: MRI 11/09/24: No acute or reversible finding. Moderate chronic small-vessel ischemic changes of the cerebral hemispheric white matter. Old bilateral thalamic infarctions.   COGNITION: Overall cognitive status: Within functional limits for tasks assessed   SENSATION: Neuropathy in bilat feet all the way up to the calf (worse on the L); feels it can go up to the hip   POSTURE: rounded shoulders, forward head, and increased thoracic kyphosis   LOWER EXTREMITY MMT:    MMT Right Eval Left Eval  Hip flexion 4- 4-  Hip extension    Hip abduction 3+ 3+  Hip adduction 4- 4-  Hip internal rotation    Hip external rotation    Knee flexion 4 4  Knee extension 5 5  Ankle dorsiflexion    Ankle plantarflexion    Ankle inversion    Ankle eversion    (Blank rows = not tested)  VESTIBULAR ASSESSMENT:    SYMPTOM BEHAVIOR:  Non-Vestibular symptoms: changes in vision and neck pain  Type of dizziness: Unsteady with head/body turns  Frequency: daily  Duration: constant  Aggravating factors: Induced by motion: occur when walking and turning body quickly  Relieving factors: head stationary, lying supine, and slow movements  Progression of symptoms: worse  OCULOMOTOR EXAM:  Ocular Alignment: normal  Ocular ROM: No Limitations  Spontaneous Nystagmus: none  Gaze-Induced Nystagmus: not completely on target but no nystagmus  Smooth Pursuits: corrective saccades  Saccades: slow and corrective saccades R>L  FRENZEL - FIXATION SUPRESSED:  Unable to test  VESTIBULAR - OCULAR REFLEX:   Slow VOR: Positive Right and Comment: corrective saccades initially but improved with reps  VOR Cancellation: Normal  Head-Impulse Test: HIT Right: positive HIT Left:  negative   POSITIONAL TESTING: (-) in sidelying Dix-Hallpike and horizontal canalith testing   MOTION SENSITIVITY:    Motion Sensitivity Quotient Intensity: 0 = none, 1 = Lightheaded, 2 = Mild, 3 = Moderate, 4 = Severe, 5 = Vomiting  Intensity  1. Sitting to supine   2. Supine to L side   3. Supine to R side   4. Supine to sitting   5. L Hallpike-Dix   6. Up from L    7. R Hallpike-Dix   8. Up from R    9. Sitting, head  tipped to L knee   10. Head up from L  knee   11. Sitting, head  tipped to R knee   12. Head up from R  knee   13. Sitting head turns x5   14.Sitting head nods x5   15. In stance, 180  turn to L    16. In stance, 180  turn to R     OTHOSTATICS:  not done  GAIT: Findings: Comments: Mildly widened BOS, diminished bilat step length, mild compensated trendelenburg  FUNCTIONAL TESTS:  MCTSIB: condition 1: 30 sec, condition 2: 30 sec, condition 3: 30 sec, condition 4: 9 sec 5 times sit to stand: 17.28 sec FGA: 20/30                                                                                                                                TREATMENT DATE:  12/08/24                                EXERCISE LOG  Exercise Repetitions and Resistance Comments  Nustep Lvl 5 x 10 mins   Smooth pursuit: horizontal and vertical 6x40' walking  Saccades: horizontal and vertical 6x40' walking  VOR x 1 horizontal and vertical 6x40' walking  Corrective saccades 2 targets horizontal and vertical 6x40' walking  Feet apart eyes closed head turns and head nods 2x30 each On airex  Feet apart eyes closed static standing 2x30 On airex               Blank cell = exercise not performed today    11/30/24                                EXERCISE LOG  Exercise Repetitions and Resistance Comments  Nustep Lvl 3 x 10 mins   Smooth pursuit: horizontal and vertical 2x30 each Standing feet together  Saccades: horizontal and vertical 2x30 each Standing feet together   VOR x 1 horizontal and vertical 2x30 each Standing feet together  Feet together eyes closed head turns and head nods 2x30   Feet together eyes closed static standing 2x30   Staggered stance on airex pad with 4# diagonal chops 2x10 each side LOB to the R  Standing feet together on airex with 4# UE flexion 2x10 LOB       Blank cell = exercise not performed today    11/27/24                                EXERCISE LOG  Exercise Repetitions and Resistance Comments  Nustep Lvl 3 x 15 mins   Rockerboard 4 mins   Standing Marches Airex x 2 mins   Tandem Gait Airex x 4 mins   Sidestepping Airex x 4 mins   LAQs 4# x 25 reps bil   Seated Marches 4# x 25 reps bil   Seated Ham Curls Green x 25 reps bil    Blank cell = exercise not performed today     PATIENT EDUCATION: Education details: HEP updates Person educated: Patient Education method: Explanation, Demonstration, and Handouts Education comprehension: verbalized understanding, returned demonstration, and needs further education  HOME EXERCISE PROGRAM: Access  Code: [email or web address] URL: [email or web address] Date: 11/30/2024 Prepared by: Jermar Colter April Earnie Starring  Exercises - Standing Feet Together Smooth Pursuit  - 2 x daily - 7 x weekly - 2 sets - 30 sec hold - Standing Horizontal Saccades  - 2 x daily - 7 x weekly - 2 sets - 30 sec hold - Standing Vertical Saccades  - 2 x daily - 7 x weekly - 2 sets - 30 sec hold - Gaze Stability (VOR) x1 Feet Together on Firm Ground With Horizontal Head Turns  - 2 x daily - 7 x weekly - 2 sets - 30 sec hold - Gaze Stability (VOR) x1 Feet Together on Firm Ground With Vertical Head Turns  - 2 x daily - 7 x weekly - 2 sets - 30 sec hold  GOALS: Goals reviewed with patient? Yes  SHORT TERM GOALS: Target date: 12/10/2024   Pt will be ind with initial HEP Baseline: Goal status: INITIAL  2.  Pt will report no increase in dizziness with all oculomotor movements Baseline:  Goal  status: INITIAL  3.  Pt will report decrease in dizziness by >/=25% Baseline:  Goal status: INITIAL    LONG TERM GOALS: Target date: 12/31/2024   Pt will be ind with management and progression of HEP Baseline:  Goal status: INITIAL  2.  Pt will be able to maintain 30 sec on foam with eyes closed (condition 4 of mCTSIB) to demo improved vestibular integration for balance Baseline: 9 sec Goal status: INITIAL  3.  Pt will have improved FGA to >/=26/30 Baseline: 20 Goal status: INITIAL  4.  Pt will have improved 5x STS to </=13 sec to demo increased functional LE strength for reduced fall risk Baseline:  Goal status: INITIAL  5.  Pt will report improved overall dizziness by >/=50% Baseline:  Goal status: INITIAL   ASSESSMENT:  CLINICAL IMPRESSION: Mr. Arvin is demonstrating good improvements -- now able to stand on foam for 30 sec with eyes closed demonstrating increase in his vestibular integration for balance. Able to progress his oculomotor and gaze stabilization exercises to walking. Pt notes that unsteadiness with walking remains the biggest challenge for him. Dizzy level has been relatively low.   OBJECTIVE IMPAIRMENTS: Abnormal gait, decreased activity tolerance, decreased balance, decreased coordination, decreased endurance, decreased mobility, difficulty walking, decreased strength, dizziness, improper body mechanics, and postural dysfunction.   ACTIVITY LIMITATIONS: standing, stairs, transfers, and locomotion level  PARTICIPATION LIMITATIONS: driving, community activity, and yard work  PERSONAL FACTORS: Age, Fitness, Past/current experiences, and Time since onset of injury/illness/exacerbation are also affecting patient's functional outcome.   REHAB POTENTIAL: Good  CLINICAL DECISION MAKING: Evolving/moderate complexity  EVALUATION COMPLEXITY: Moderate  PLAN:  PT FREQUENCY: 2x/week  PT DURATION: 6 weeks  PLANNED INTERVENTIONS: 97164- PT Re-evaluation,  97750- Physical Performance Testing, 97110-Therapeutic exercises, 97530- Therapeutic activity, [id number]- Neuromuscular re-education, 97535- Self Care, 02859- Manual therapy, [id number]- Gait training, [id number] (1-2 muscles), [id number] (3+ muscles)- Dry Needling, Patient/Family education, Balance training, and Vestibular training  PLAN FOR NEXT SESSION: Progress oculomotor and gaze stabilization exercises. Work on core strength and walking balance. Consider resisted walking.    Mizani Dilday April Ma L Uniqua Kihn, PT, DPT 12/08/2024, 9:27 AM  "

## 2024-12-11 ENCOUNTER — Ambulatory Visit: Payer: Self-pay | Admitting: Family Medicine

## 2024-12-11 ENCOUNTER — Encounter: Payer: Self-pay | Admitting: Family Medicine

## 2024-12-11 VITALS — BP 152/79 | HR 79 | Temp 97.4°F | Ht 72.0 in | Wt 158.4 lb

## 2024-12-11 DIAGNOSIS — E441 Mild protein-calorie malnutrition: Secondary | ICD-10-CM | POA: Diagnosis not present

## 2024-12-11 DIAGNOSIS — E782 Mixed hyperlipidemia: Secondary | ICD-10-CM

## 2024-12-11 DIAGNOSIS — I482 Chronic atrial fibrillation, unspecified: Secondary | ICD-10-CM | POA: Diagnosis not present

## 2024-12-11 DIAGNOSIS — H6121 Impacted cerumen, right ear: Secondary | ICD-10-CM | POA: Diagnosis not present

## 2024-12-11 DIAGNOSIS — E039 Hypothyroidism, unspecified: Secondary | ICD-10-CM | POA: Diagnosis not present

## 2024-12-11 DIAGNOSIS — R42 Dizziness and giddiness: Secondary | ICD-10-CM | POA: Diagnosis not present

## 2024-12-11 DIAGNOSIS — R140 Abdominal distension (gaseous): Secondary | ICD-10-CM | POA: Diagnosis not present

## 2024-12-11 MED ORDER — PANTOPRAZOLE SODIUM 40 MG PO TBEC: 40.0000 mg | DELAYED_RELEASE_TABLET | Freq: Every day | ORAL | 3 refills | Status: AC

## 2024-12-11 NOTE — Progress Notes (Signed)
 "  Subjective: CC: Recheck weight PCP: Jolinda Norene HERO, DO HPI:Dalton Vaughan is a 89 y.o. male presenting to clinic today for:  Protein malnutrition Patient had gotten a little underweight and he is here for recheck.  We discussed protein intake at last visit and he has been compliant with this.  He reports that he has seen his neurosurgeon since her last visit and they recommended physical therapy plus or minus referral to neurology.  Continues to get some intermittent dizzy spells.  In fact today he reports he has been having some fluidlike sensation in the right ear and would like that checked out as it may be impacting his dizziness.  He is compliant with all medications reports no red flag signs or symptoms including bleeding, heart palpitation, chest pain or shortness of breath.  He is fasting today which is why he thinks his blood pressure may be a little elevated.  Needs refill PPI as that has been helping with the abdominal bloating  He is not quite sure if the manufacturer of his thyroid  medicine has changed as it does look slightly different than the previous bottles.   ROS: Per HPI  Allergies[1] Past Medical History:  Diagnosis Date   Anal fissure    Arthritis    Atrial fibrillation (HCC)    BPH (benign prostatic hypertrophy)    CAD (coronary artery disease)    Dr. Blanca   Cataract    COVID-19    Depression 2004   Diverticulosis    GERD (gastroesophageal reflux disease)    History of TIAs    Hyperlipidemia    Hypertension    Hypertensive heart disease without CHF    Hypothyroidism    Internal hemorrhoids    Lumbar disc disease    NSTEMI (non-ST elevated myocardial infarction) (HCC) 01/22/2024   Oral bleeding 08/20/2012   Following molar tooth extraction July, 2013   Pneumonia    Ruptured disk 1995   Shingles    Thyroid  disease    Vitamin D  deficiency    Vitreous detachment    Current Medications[2] Social History   Socioeconomic History   Marital  status: Widowed    Spouse name: [name]    Number of children: 1   Years of education: 12   Highest education level: Not on file  Occupational History   Occupation: Retired    Veterinary Surgeon: Company Secretary x 27 years   Occupation: Retired    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    Comment: supervisor x 17 years  Tobacco Use   Smoking status: Former    Current packs/day: 0.00    Average packs/day: 1 pack/day for 18.0 years (18.0 ttl pk-yrs)    Types: Cigarettes    Start date: 12/17/1948    Quit date: 12/17/1966    Years since quitting: 58.0   Smokeless tobacco: Never  Vaping Use   Vaping status: Never Used  Substance and Sexual Activity   Alcohol use: Yes    Alcohol/week: 1.0 standard drink of alcohol    Types: 1 Standard drinks or equivalent per week    Comment: rare   Drug use: No   Sexual activity: Not Currently  Other Topics Concern   Not on file  Social History Narrative   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-handed      Caffeine use: none      Son and grandson live with him   Social Drivers of Health   Tobacco  Use: Medium Risk (12/11/2024)   Patient History    Smoking Tobacco Use: Former    Smokeless Tobacco Use: Never    Passive Exposure: Not on file  Financial Resource Strain: Low Risk (03/05/2024)   Overall Financial Resource Strain (CARDIA)    Difficulty of Paying Living Expenses: Not very hard  Food Insecurity: No Food Insecurity (03/05/2024)   Hunger Vital Sign    Worried About Running Out of Food in the Last Year: Never true    Ran Out of Food in the Last Year: Never true  Transportation Needs: No Transportation Needs (03/05/2024)   PRAPARE - Administrator, Civil Service (Medical): No    Lack of Transportation (Non-Medical): No  Physical Activity: Sufficiently Active (03/05/2024)   Exercise Vital Sign    Days of Exercise per Week: 7 days    Minutes of Exercise per Session: 70 min  Stress: No Stress Concern Present  (03/05/2024)   [employer] of Occupational Health - Occupational Stress Questionnaire    Feeling of Stress : Not at all  Social Connections: Socially Isolated (03/05/2024)   Social Connection and Isolation Panel    Frequency of Communication with Friends and Family: More than three times a week    Frequency of Social Gatherings with Friends and Family: Once a week    Attends Religious Services: Never    Database Administrator or Organizations: No    Attends Banker Meetings: Never    Marital Status: Widowed  Intimate Partner Violence: Not At Risk (03/05/2024)   Humiliation, Afraid, Rape, and Kick questionnaire    Fear of Current or Ex-Partner: No    Emotionally Abused: No    Physically Abused: No    Sexually Abused: No  Depression (PHQ2-9): Low Risk (12/11/2024)   Depression (PHQ2-9)    PHQ-2 Score: 0  Alcohol Screen: Low Risk (03/05/2024)   Alcohol Screen    Last Alcohol Screening Score (AUDIT): 4  Housing: Low Risk (03/05/2024)   Housing Stability Vital Sign    Unable to Pay for Housing in the Last Year: No    Number of Times Moved in the Last Year: 0    Homeless in the Last Year: No  Utilities: Not At Risk (03/05/2024)   AHC Utilities    Threatened with loss of utilities: No  Health Literacy: Adequate Health Literacy (03/05/2024)   B1300 Health Literacy    Frequency of need for help with medical instructions: Never   Family History  Problem Relation Age of Onset   Dementia Mother    Arthritis Mother    Cancer Brother        prostate   Asthma Sister    Heart disease Paternal Aunt    Heart disease Paternal Uncle    Hypertension Maternal Grandmother    CVA Maternal Grandmother    CVA Paternal Grandmother    Hepatitis C Son    Arthritis Son    Colon cancer Neg Hx    Colon polyps Neg Hx    Kidney disease Neg Hx    Esophageal cancer Neg Hx    Gallbladder disease Neg Hx    Diabetes Neg Hx     Objective: Office vital signs reviewed. BP (!) 152/79    Pulse 79   Temp (!) 97.4 F (36.3 C)   Ht 6' (1.829 m)   Wt 158 lb 6 oz (71.8 kg)   SpO2 97%   BMI 21.48 kg/m   Physical Examination:  General: Awake,  alert, well nourished, No acute distress HEENT: Right TM obscured by cerumen.  Left TM intact with normal light reflex.  Moist mucous membranes.  No exophthalmos Cardio: Rate controlled but irregularly irregular.  Soft murmur present Pulm: clear to auscultation bilaterally, no wheezes, rhonchi or rales; normal work of breathing on room air Neuro: Ambulating independently with normal gait and station  Assessment/ Plan: 89 y.o. male   Mild protein-calorie malnutrition - Plan: CMP14+EGFR  Dizziness  Abdominal bloating - Plan: pantoprazole  (PROTONIX ) 40 MG tablet  Acquired hypothyroidism - Plan: TSH + free T4, CMP14+EGFR  Mixed hyperlipidemia - Plan: Lipid panel, CMP14+EGFR  Chronic atrial fibrillation (HCC) - Plan: CMP14+EGFR  Impacted cerumen of right ear   Weight improving and he has gained almost 10 pound since our last visit.  Continue high-protein diet.  Continue PPI as needed Check fasting labs.  He was given a bottle of juice to have after his labs drawn today just in case.  His right ear was sore excessively irrigated with removal of impacted cerumen.  He may follow-up in 4 to 6 months for interval checkup, sooner if concerns arise   Norene CHRISTELLA Fielding, DO [facility] [phone]     [1]  Allergies Allergen Reactions   Paxil [Paroxetine Hcl] Palpitations   Codeine Other (See Comments)   Eliquis  [Apixaban ] Other (See Comments)    dizziness   Pravachol [Pravastatin Sodium] Other (See Comments)    myalgias   Zetia  [Ezetimibe ] Other (See Comments)    myalgias   Penicillins Hives, Rash and Other (See Comments)    Happened in 1959   Vytorin [Ezetimibe -Simvastatin] Other (See Comments)    myalgias  [2]  Current Outpatient Medications:    apixaban  (ELIQUIS ) 5 MG TABS tablet, TAKE 1  TABLET TWICE A DAY, Disp: 180 tablet, Rfl: 0   clopidogrel  (PLAVIX ) 75 MG tablet, Take 1 tablet (75 mg total) by mouth daily., Disp: 90 tablet, Rfl: 2   doxazosin  (CARDURA ) 8 MG tablet, TAKE ONE TABLET AT BEDTIME, Disp: 30 tablet, Rfl: 0   ezetimibe  (ZETIA ) 10 MG tablet, Take 1 tablet (10 mg total) by mouth daily., Disp: 90 tablet, Rfl: 2   isosorbide  mononitrate (IMDUR ) 30 MG 24 hr tablet, Take 1 tablet (30 mg total) by mouth daily., Disp: 90 tablet, Rfl: 1   Lactobacillus (PROBIOTIC ACIDOPHILUS) CAPS, Take 1 capsule by mouth daily., Disp: , Rfl:    levothyroxine  (SYNTHROID ) 50 MCG tablet, TAKE 1 TABLET DAILY BEFORE BREAKFAST, Disp: 30 tablet, Rfl: 0   meclizine (ANTIVERT) 25 MG tablet, Take 25 mg by mouth daily as needed for dizziness., Disp: , Rfl:    mirtazapine  (REMERON ) 15 MG tablet, TAKE 1 TABLET AT BEDTIME, Disp: 30 tablet, Rfl: 0   Multiple Vitamin (MULTIVITAMIN) tablet, Take 1 tablet by mouth daily., Disp: , Rfl:    ramipril  (ALTACE ) 10 MG capsule, TAKE 1 CAPSULE DAILY, Disp: 90 capsule, Rfl: 3   rosuvastatin  (CRESTOR ) 10 MG tablet, Take 1 tablet (10 mg total) by mouth every other day., Disp: 90 tablet, Rfl: 0   SIMETHICONE  PO, Take 1 tablet by mouth as needed (gas)., Disp: , Rfl:    vitamin C (ASCORBIC ACID) 500 MG tablet, Take 500 mg by mouth daily. , Disp: , Rfl:    azelastine  (ASTELIN ) 0.1 % nasal spray, USE 1 SPRAY IN EACH NOSTRIL TWICE A DAY (Patient not taking: Reported on 12/11/2024), Disp: 60 mL, Rfl: 3   pantoprazole  (PROTONIX ) 40 MG tablet, Take 1 tablet (40 mg  total) by mouth daily., Disp: 100 tablet, Rfl: 3  "

## 2024-12-12 LAB — CMP14+EGFR
ALT: 34 IU/L (ref 0–44)
AST: 35 IU/L (ref 0–40)
Albumin: 4.2 g/dL (ref 3.6–4.6)
Alkaline Phosphatase: 132 IU/L — ABNORMAL HIGH (ref 48–129)
BUN/Creatinine Ratio: 21 (ref 10–24)
BUN: 15 mg/dL (ref 10–36)
Bilirubin Total: 0.5 mg/dL (ref 0.0–1.2)
CO2: 26 mmol/L (ref 20–29)
Calcium: 9.1 mg/dL (ref 8.6–10.2)
Chloride: 101 mmol/L (ref 96–106)
Creatinine, Ser: 0.73 mg/dL — ABNORMAL LOW (ref 0.76–1.27)
Globulin, Total: 1.9 g/dL (ref 1.5–4.5)
Glucose: 91 mg/dL (ref 70–99)
Potassium: 3.9 mmol/L (ref 3.5–5.2)
Sodium: 140 mmol/L (ref 134–144)
Total Protein: 6.1 g/dL (ref 6.0–8.5)
eGFR: 85 mL/min/1.73

## 2024-12-12 LAB — LIPID PANEL
Chol/HDL Ratio: 2.4 ratio (ref 0.0–5.0)
Cholesterol, Total: 119 mg/dL (ref 100–199)
HDL: 50 mg/dL
LDL Chol Calc (NIH): 57 mg/dL (ref 0–99)
Triglycerides: 53 mg/dL (ref 0–149)
VLDL Cholesterol Cal: 12 mg/dL (ref 5–40)

## 2024-12-12 LAB — TSH+FREE T4
Free T4: 1.21 ng/dL (ref 0.82–1.77)
TSH: 2.73 u[IU]/mL (ref 0.450–4.500)

## 2024-12-14 ENCOUNTER — Ambulatory Visit: Payer: Self-pay | Admitting: Family Medicine

## 2024-12-16 ENCOUNTER — Ambulatory Visit: Admitting: Physical Therapy

## 2024-12-16 DIAGNOSIS — R42 Dizziness and giddiness: Secondary | ICD-10-CM | POA: Diagnosis not present

## 2024-12-16 DIAGNOSIS — R2689 Other abnormalities of gait and mobility: Secondary | ICD-10-CM

## 2024-12-16 DIAGNOSIS — M6281 Muscle weakness (generalized): Secondary | ICD-10-CM

## 2024-12-16 NOTE — Therapy (Signed)
 " OUTPATIENT PHYSICAL THERAPY NEURO TREATMENT   Patient Name: Dalton Vaughan MRN: [id number] DOB:1929-05-17, 89 y.o., male Today's Date: 12/16/2024   PCP: Jolinda Norene HERO, DO REFERRING PROVIDER: Johnanna Camie Mates, PA-C  END OF SESSION:  PT End of Session - 12/16/24 0942     Visit Number 6    Number of Visits 12    Date for Recertification  12/31/24    Authorization Type Medicare    PT Start Time 0929    PT Stop Time 1011    PT Time Calculation (min) 42 min    Activity Tolerance Patient tolerated treatment well    Behavior During Therapy [facility] for tasks assessed/performed            Past Medical History:  Diagnosis Date   Anal fissure    Arthritis    Atrial fibrillation (HCC)    BPH (benign prostatic hypertrophy)    CAD (coronary artery disease)    Dr. Blanca   Cataract    COVID-19    Depression 2004   Diverticulosis    GERD (gastroesophageal reflux disease)    History of TIAs    Hyperlipidemia    Hypertension    Hypertensive heart disease without CHF    Hypothyroidism    Internal hemorrhoids    Lumbar disc disease    NSTEMI (non-ST elevated myocardial infarction) (HCC) 01/22/2024   Oral bleeding 08/20/2012   Following molar tooth extraction July, 2013   Pneumonia    Ruptured disk 1995   Shingles    Thyroid  disease    Vitamin D  deficiency    Vitreous detachment    Past Surgical History:  Procedure Laterality Date   bilateral cataract surgery  6/07   CARDIAC CATHETERIZATION     CORONARY ARTERY BYPASS GRAFT  12/99    Dr. Army DOWNS ANGIOGRAPHY N/A 01/28/2024   Procedure: CORONARY/GRAFT ANGIOGRAPHY;  Surgeon: Elmira Newman PARAS, MD;  Location: [facility] INVASIVE CV LAB;  Service: Cardiovascular;  Laterality: N/A;   cysloscopy  8/08   EYE SURGERY     HERNIA REPAIR  1997   right   herninated disc  1995   PACEMAKER INSERTION  11/03/2020   PROSTATE BIOPSY  8/08   TONSILLECTOMY AND ADENOIDECTOMY     Patient Active Problem List    Diagnosis Date Noted   Ataxia 03/05/2024   Lightheadedness 03/05/2024   Prolonged QT interval 01/22/2024   Acquired hypothyroidism 01/22/2024   Purpura senilis 06/27/2022   Pacemaker 11/03/2020   Essential hypertension 01/25/2020   Osteoarthritis of spine with radiculopathy, lumbar region 01/08/2018   Aortic atherosclerosis 05/02/2017   Thrombocytopenia 07/10/2016   Cervical spondylosis without myelopathy 04/25/2016   Hereditary and idiopathic peripheral neuropathy 10/16/2015   Allergic rhinitis 05/06/2014   Depression 08/03/2013   Sick sinus syndrome (HCC) 07/31/2012   Hypertensive heart disease without CHF    Chronic atrial fibrillation (HCC)    Coronary artery disease involving native coronary artery of native heart with angina pectoris    History of TIAs    GERD (gastroesophageal reflux disease)    Chronic anticoagulation    Benign prostatic hyperplasia    Mixed hyperlipidemia     ONSET DATE: 2 years ago  REFERRING DIAG: R27.0 (ICD-10-CM) - Ataxia, unspecified  THERAPY DIAG:  Dizziness and giddiness  Other abnormalities of gait and mobility  Muscle weakness (generalized)  Rationale for Evaluation and Treatment: Rehabilitation  SUBJECTIVE:  SUBJECTIVE STATEMENT: Pt states he's been doing his exercises -- gets a little dizzy with them and then it tapers off. Dizziness is low this morning. Still doesn't feel right with his walking balance.  Pt accompanied by: self  PERTINENT HISTORY: Back 1995, heart bypass 1999, pacemaker  PAIN:  Are you having pain? No  PRECAUTIONS: None  RED FLAGS: None   WEIGHT BEARING RESTRICTIONS: No  FALLS: Has patient fallen in last 6 months? No  LIVING ENVIRONMENT: Lives with: lives with their family; son and grandson Lives in:  House/apartment Stairs: 3 steps to enter with hand rail Has following equipment at home: Single point cane  PLOF: Independent  PATIENT GOALS: Improve dizziness with walking and turning  OBJECTIVE:  Note: Objective measures were completed at Evaluation unless otherwise noted.  DIAGNOSTIC FINDINGS: MRI 11/09/24: No acute or reversible finding. Moderate chronic small-vessel ischemic changes of the cerebral hemispheric white matter. Old bilateral thalamic infarctions.   COGNITION: Overall cognitive status: Within functional limits for tasks assessed   SENSATION: Neuropathy in bilat feet all the way up to the calf (worse on the L); feels it can go up to the hip   POSTURE: rounded shoulders, forward head, and increased thoracic kyphosis   LOWER EXTREMITY MMT:    MMT Right Eval Left Eval  Hip flexion 4- 4-  Hip extension    Hip abduction 3+ 3+  Hip adduction 4- 4-  Hip internal rotation    Hip external rotation    Knee flexion 4 4  Knee extension 5 5  Ankle dorsiflexion    Ankle plantarflexion    Ankle inversion    Ankle eversion    (Blank rows = not tested)  VESTIBULAR ASSESSMENT:    SYMPTOM BEHAVIOR:  Non-Vestibular symptoms: changes in vision and neck pain  Type of dizziness: Unsteady with head/body turns  Frequency: daily  Duration: constant  Aggravating factors: Induced by motion: occur when walking and turning body quickly  Relieving factors: head stationary, lying supine, and slow movements  Progression of symptoms: worse  OCULOMOTOR EXAM:  Ocular Alignment: normal  Ocular ROM: No Limitations  Spontaneous Nystagmus: none  Gaze-Induced Nystagmus: not completely on target but no nystagmus  Smooth Pursuits: corrective saccades  Saccades: slow and corrective saccades R>L  FRENZEL - FIXATION SUPRESSED:  Unable to test  VESTIBULAR - OCULAR REFLEX:   Slow VOR: Positive Right and Comment: corrective saccades initially but improved with reps  VOR  Cancellation: Normal  Head-Impulse Test: HIT Right: positive HIT Left: negative   POSITIONAL TESTING: (-) in sidelying Dix-Hallpike and horizontal canalith testing   MOTION SENSITIVITY:    Motion Sensitivity Quotient Intensity: 0 = none, 1 = Lightheaded, 2 = Mild, 3 = Moderate, 4 = Severe, 5 = Vomiting  Intensity  1. Sitting to supine   2. Supine to L side   3. Supine to R side   4. Supine to sitting   5. L Hallpike-Dix   6. Up from L    7. R Hallpike-Dix   8. Up from R    9. Sitting, head  tipped to L knee   10. Head up from L  knee   11. Sitting, head  tipped to R knee   12. Head up from R  knee   13. Sitting head turns x5   14.Sitting head nods x5   15. In stance, 180  turn to L    16. In stance, 180  turn to R     OTHOSTATICS:  not done  GAIT: Findings: Comments: Mildly widened BOS, diminished bilat step length, mild compensated trendelenburg  FUNCTIONAL TESTS:  MCTSIB: condition 1: 30 sec, condition 2: 30 sec, condition 3: 30 sec, condition 4: 9 sec 5 times sit to stand: 17.28 sec FGA: 20/30                                                                                                                                TREATMENT DATE:   12/16/24:                                     EXERCISE LOG  Exercise Repetitions and Resistance Comments  Nustep Level 4 x 15 minutes    Rockerboard In parallel bars x 5 minutes   Inverted BOSU ball In parallel bars x 5 minutes   Foam balance beam Side-stepping in parallel bars x 5 minutes   Knee ext 10# x 4 minutes   Ham curls 10# x 4 minutes     12/08/24                                EXERCISE LOG  Exercise Repetitions and Resistance Comments  Nustep Lvl 5 x 10 mins   Smooth pursuit: horizontal and vertical 6x40' walking  Saccades: horizontal and vertical 6x40' walking  VOR x 1 horizontal and vertical 6x40' walking  Corrective saccades 2 targets horizontal and vertical 6x40' walking  Feet apart eyes closed  head turns and head nods 2x30 each On airex  Feet apart eyes closed static standing 2x30 On airex               Blank cell = exercise not performed today    11/30/24                                EXERCISE LOG  Exercise Repetitions and Resistance Comments  Nustep Lvl 3 x 10 mins   Smooth pursuit: horizontal and vertical 2x30 each Standing feet together  Saccades: horizontal and vertical 2x30 each Standing feet together  VOR x 1 horizontal and vertical 2x30 each Standing feet together  Feet together eyes closed head turns and head nods 2x30   Feet together eyes closed static standing 2x30   Staggered stance on airex pad with 4# diagonal chops 2x10 each side LOB to the R  Standing feet together on airex with 4# UE flexion 2x10 LOB       Blank cell = exercise not performed today    11/27/24                                EXERCISE LOG  Exercise Repetitions and Resistance Comments  Nustep Lvl 3 x 15 mins   Rockerboard 4 mins   Standing Marches Airex x 2 mins   Tandem Gait Airex x 4 mins   Sidestepping Airex x 4 mins   LAQs 4# x 25 reps bil   Seated Marches 4# x 25 reps bil   Seated Ham Curls Green x 25 reps bil    Blank cell = exercise not performed today     PATIENT EDUCATION: Education details: HEP updates Person educated: Patient Education method: Explanation, Demonstration, and Handouts Education comprehension: verbalized understanding, returned demonstration, and needs further education  HOME EXERCISE PROGRAM: Access Code: [facility] URL: [email or web address] Date: 11/30/2024 Prepared by: Gellen April Earnie Starring  Exercises - Standing Feet Together Smooth Pursuit  - 2 x daily - 7 x weekly - 2 sets - 30 sec hold - Standing Horizontal Saccades  - 2 x daily - 7 x weekly - 2 sets - 30 sec hold - Standing Vertical Saccades  - 2 x daily - 7 x weekly - 2 sets - 30 sec hold - Gaze Stability (VOR) x1 Feet Together on Firm Ground With Horizontal Head  Turns  - 2 x daily - 7 x weekly - 2 sets - 30 sec hold - Gaze Stability (VOR) x1 Feet Together on Firm Ground With Vertical Head Turns  - 2 x daily - 7 x weekly - 2 sets - 30 sec hold  GOALS: Goals reviewed with patient? Yes  SHORT TERM GOALS: Target date: 12/10/2024   Pt will be ind with initial HEP Baseline: Goal status: INITIAL  2.  Pt will report no increase in dizziness with all oculomotor movements Baseline:  Goal status: INITIAL  3.  Pt will report decrease in dizziness by >/=25% Baseline:  Goal status: INITIAL    LONG TERM GOALS: Target date: 12/31/2024   Pt will be ind with management and progression of HEP Baseline:  Goal status: INITIAL  2.  Pt will be able to maintain 30 sec on foam with eyes closed (condition 4 of mCTSIB) to demo improved vestibular integration for balance Baseline: 9 sec Goal status: INITIAL  3.  Pt will have improved FGA to >/=26/30 Baseline: 20 Goal status: INITIAL  4.  Pt will have improved 5x STS to </=13 sec to demo increased functional LE strength for reduced fall risk Baseline:  Goal status: INITIAL  5.  Pt will report improved overall dizziness by >/=50% Baseline:  Goal status: INITIAL   ASSESSMENT:  CLINICAL IMPRESSION: The patient did great today and required minimal hand usage with balance activities in the parallel bars.  OBJECTIVE IMPAIRMENTS: Abnormal gait, decreased activity tolerance, decreased balance, decreased coordination, decreased endurance, decreased mobility, difficulty walking, decreased strength, dizziness, improper body mechanics, and postural dysfunction.   ACTIVITY LIMITATIONS: standing, stairs, transfers, and locomotion level  PARTICIPATION LIMITATIONS: driving, community activity, and yard work  PERSONAL FACTORS: Age, Fitness, Past/current experiences, and Time since onset of injury/illness/exacerbation are also affecting patient's functional outcome.   REHAB POTENTIAL: Good  CLINICAL DECISION  MAKING: Evolving/moderate complexity  EVALUATION COMPLEXITY: Moderate  PLAN:  PT FREQUENCY: 2x/week  PT DURATION: 6 weeks  PLANNED INTERVENTIONS: [id number]- PT Re-evaluation, 97750- Physical Performance Testing, 97110-Therapeutic exercises, 97530- Therapeutic activity, [id number]- Neuromuscular re-education, 97535- Self Care, [id number]- Manual therapy, [id number]- Gait training, [phone] (1-2 muscles), [id number] (3+ muscles)- Dry Needling, Patient/Family education, Balance training, and Vestibular training  PLAN FOR NEXT SESSION: Progress oculomotor and gaze stabilization exercises. Work on core strength and walking balance. Consider resisted walking.  Janille Draughon, PT, DPT 12/16/2024, 10:20 AM  "

## 2024-12-18 ENCOUNTER — Encounter: Payer: Self-pay | Admitting: Family Medicine

## 2024-12-18 ENCOUNTER — Ambulatory Visit: Admitting: Family Medicine

## 2024-12-18 VITALS — BP 134/76 | HR 85 | Ht 72.0 in | Wt 162.0 lb

## 2024-12-18 DIAGNOSIS — E039 Hypothyroidism, unspecified: Secondary | ICD-10-CM | POA: Diagnosis not present

## 2024-12-18 DIAGNOSIS — I119 Hypertensive heart disease without heart failure: Secondary | ICD-10-CM

## 2024-12-18 MED ORDER — AMLODIPINE BESYLATE 2.5 MG PO TABS: 2.5000 mg | ORAL_TABLET | Freq: Every day | ORAL | 1 refills | Status: AC

## 2024-12-18 NOTE — Telephone Encounter (Signed)
 Please inform patient that okay to stick with Norvasc  2.5 mg daily for now as his blood pressure is too low on the 5 mg.  I worried that he will have worsening dizziness and potentially pass out.  Continue to monitor blood pressures.  If he is running less than 150 systolic and less than 90 diastolic then that is okay and appropriate for his age.  Please have him follow-up if he continues to have undulating blood pressure sooner than his normal follow-up visit.

## 2024-12-18 NOTE — Progress Notes (Signed)
 "  BP 134/76   Pulse 85   Ht 6' (1.829 m)   Wt 162 lb (73.5 kg)   SpO2 97%   BMI 21.97 kg/m    Subjective:   Patient ID: Dalton Vaughan, male    DOB: 02-Dec-1929, 89 y.o.   MRN: [id number]  HPI: Dalton Vaughan is a 89 y.o. male presenting on 12/18/2024 for Hypertension   Discussed the use of AI scribe software for clinical note transcription with the patient, who gave verbal consent to proceed.  History of Present Illness   Dalton Vaughan is a 89 year old male who presents with fluctuating blood pressure after a change in levothyroxine  brand.  Blood pressure fluctuations - Blood pressure became elevated after a change in levothyroxine  brand, with readings reaching 152/79 mmHg. - Previously, blood pressure was stable around 110-115/60 mmHg. - Blood pressure stabilized after resuming amlodipine , with recent readings of 93/58 mmHg and 108/65 mmHg. - Blood pressure is usually higher during doctor's visits due to nervousness. - Monitors blood pressure daily.  Symptoms of jitteriness and hyperactivity - Experienced jitteriness and feeling 'hyper' following the change in levothyroxine  brand. - Symptoms began around December 12th, shortly after receiving new levothyroxine  through [facility]. - Symptoms have improved and he feels 'back down to about normal'.  Medication changes and adverse effects - Change in levothyroxine  brand occurred around December 12th, preceding onset of symptoms and blood pressure fluctuations. - Previously on amlodipine  5 mg daily; reduced to half a tablet due to leg swelling. - Stopped amlodipine  around March or April when blood pressure was low; resumed recently when blood pressure increased. - Currently takes amlodipine  5 mg daily.  Cardiac history - Mild myocardial infarction in February of the previous year. - Medications were adjusted following the cardiac event.          Relevant past medical, surgical, family and social history reviewed and updated as  indicated. Interim medical history since our last visit reviewed. Allergies and medications reviewed and updated.  Review of Systems  Constitutional:  Negative for chills and fever.  Respiratory:  Negative for shortness of breath and wheezing.   Cardiovascular:  Negative for chest pain and leg swelling.  Musculoskeletal:  Negative for back pain and gait problem.  Skin:  Negative for rash.  Neurological:  Positive for tremors (Jittery and shaky). Negative for dizziness and light-headedness.  All other systems reviewed and are negative.   Per HPI unless specifically indicated above   Allergies as of 12/18/2024       Reactions   Paxil [paroxetine Hcl] Palpitations   Codeine Other (See Comments)   Eliquis  [apixaban ] Other (See Comments)   dizziness   Pravachol [pravastatin Sodium] Other (See Comments)   myalgias   Zetia  [ezetimibe ] Other (See Comments)   myalgias   Penicillins Hives, Rash, Other (See Comments)   Happened in 1959   Vytorin [ezetimibe -simvastatin] Other (See Comments)   myalgias        Medication List        Accurate as of December 18, 2024 11:14 AM. If you have any questions, ask your nurse or doctor.          amLODipine  2.5 MG tablet Commonly known as: NORVASC  Take 1 tablet (2.5 mg total) by mouth daily. What changed:  medication strength how much to take Changed by: Fonda Levins, MD   ascorbic acid 500 MG tablet Commonly known as: VITAMIN C Take 500 mg by mouth daily.   azelastine  0.1 % nasal spray  Commonly known as: ASTELIN  USE 1 SPRAY IN EACH NOSTRIL TWICE A DAY   clopidogrel  75 MG tablet Commonly known as: PLAVIX  Take 1 tablet (75 mg total) by mouth daily.   doxazosin  8 MG tablet Commonly known as: CARDURA  TAKE ONE TABLET AT BEDTIME   Eliquis  5 MG Tabs tablet Generic drug: apixaban  TAKE 1 TABLET TWICE A DAY   ezetimibe  10 MG tablet Commonly known as: ZETIA  Take 1 tablet (10 mg total) by mouth daily.   isosorbide  mononitrate  30 MG 24 hr tablet Commonly known as: IMDUR  Take 1 tablet (30 mg total) by mouth daily.   levothyroxine  50 MCG tablet Commonly known as: SYNTHROID  TAKE 1 TABLET DAILY BEFORE BREAKFAST   meclizine 25 MG tablet Commonly known as: ANTIVERT Take 25 mg by mouth daily as needed for dizziness.   mirtazapine  15 MG tablet Commonly known as: REMERON  TAKE 1 TABLET AT BEDTIME   multivitamin tablet Take 1 tablet by mouth daily.   pantoprazole  40 MG tablet Commonly known as: PROTONIX  Take 1 tablet (40 mg total) by mouth daily.   Probiotic Acidophilus Caps Take 1 capsule by mouth daily.   ramipril  10 MG capsule Commonly known as: ALTACE  TAKE 1 CAPSULE DAILY   rosuvastatin  10 MG tablet Commonly known as: Crestor  Take 1 tablet (10 mg total) by mouth every other day.   SIMETHICONE  PO Take 1 tablet by mouth as needed (gas).         Objective:   BP 134/76   Pulse 85   Ht 6' (1.829 m)   Wt 162 lb (73.5 kg)   SpO2 97%   BMI 21.97 kg/m   Wt Readings from Last 3 Encounters:  12/18/24 162 lb (73.5 kg)  12/11/24 158 lb 6 oz (71.8 kg)  09/09/24 149 lb 8 oz (67.8 kg)    Physical Exam Physical Exam   VITALS: BP- 134/76 NECK: Thyroid  without nodules. CHEST: Lungs clear to auscultation bilaterally. CARDIOVASCULAR: Regular heart sounds with systolic murmur.         Assessment & Plan:   Problem List Items Addressed This Visit       Cardiovascular and Mediastinum   Hypertensive heart disease without CHF - Primary (Chronic)   Relevant Medications   amLODipine  (NORVASC ) 2.5 MG tablet     Endocrine   Acquired hypothyroidism   Relevant Orders   Thyroid  Panel With TSH       Hypertensive heart disease without heart failure Blood pressure fluctuated, recently elevated, possibly due to thyroid  medication changes. Current reading 134/76 mmHg. Amlodipine  5 mg may be excessive. - Prescribed amlodipine  2.5 mg daily. - Monitor blood pressure daily. - Follow up with cardiologist  in six months or sooner if blood pressure remains uncontrolled.  Hypothyroidism Recent change in levothyroxine  manufacturer may have caused transient symptoms. Symptoms improving as body adjusts. - Ordered thyroid  function tests.          Follow up plan: Return if symptoms worsen or fail to improve.  Counseling provided for all of the vaccine components Orders Placed This Encounter  Procedures   Thyroid  Panel With TSH    Fonda Levins, MD [facility] 12/18/2024, 11:14 AM     "

## 2024-12-19 LAB — THYROID PANEL WITH TSH
Free Thyroxine Index: 2.1 (ref 1.2–4.9)
T3 Uptake Ratio: 32 % (ref 24–39)
T4, Total: 6.7 ug/dL (ref 4.5–12.0)
TSH: 4.87 u[IU]/mL — ABNORMAL HIGH (ref 0.450–4.500)

## 2024-12-22 ENCOUNTER — Encounter: Payer: Self-pay | Admitting: *Deleted

## 2024-12-22 ENCOUNTER — Ambulatory Visit: Attending: Surgery | Admitting: *Deleted

## 2024-12-22 DIAGNOSIS — M6281 Muscle weakness (generalized): Secondary | ICD-10-CM | POA: Insufficient documentation

## 2024-12-22 DIAGNOSIS — R2689 Other abnormalities of gait and mobility: Secondary | ICD-10-CM | POA: Diagnosis present

## 2024-12-22 DIAGNOSIS — R42 Dizziness and giddiness: Secondary | ICD-10-CM | POA: Insufficient documentation

## 2024-12-22 NOTE — Therapy (Signed)
 " OUTPATIENT PHYSICAL THERAPY NEURO TREATMENT   Patient Name: Dalton Vaughan MRN: [id number] DOB:[birth year], 89 y.o., male Today's Date: 12/22/2024   PCP: Jolinda Norene HERO, DO REFERRING PROVIDER: Johnanna Camie Mates, PA-C  END OF SESSION:  PT End of Session - 12/22/24 1355     Visit Number 7    Number of Visits 12    Date for Recertification  12/31/24    Authorization Type Medicare    PT Start Time 1345    PT Stop Time 1435    PT Time Calculation (min) 50 min            Past Medical History:  Diagnosis Date   Anal fissure    Arthritis    Atrial fibrillation (HCC)    BPH (benign prostatic hypertrophy)    CAD (coronary artery disease)    Dr. Blanca   Cataract    COVID-19    Depression 2004   Diverticulosis    GERD (gastroesophageal reflux disease)    History of TIAs    Hyperlipidemia    Hypertension    Hypertensive heart disease without CHF    Hypothyroidism    Internal hemorrhoids    Lumbar disc disease    NSTEMI (non-ST elevated myocardial infarction) (HCC) 01/22/2024   Oral bleeding 08/20/2012   Following molar tooth extraction July, 2013   Pneumonia    Ruptured disk 1995   Shingles    Thyroid  disease    Vitamin D  deficiency    Vitreous detachment    Past Surgical History:  Procedure Laterality Date   bilateral cataract surgery  6/07   CARDIAC CATHETERIZATION     CORONARY ARTERY BYPASS GRAFT  12/99    Dr. Army DOWNS ANGIOGRAPHY N/A 01/28/2024   Procedure: CORONARY/GRAFT ANGIOGRAPHY;  Surgeon: Elmira Newman PARAS, MD;  Location: [facility];  Service: Cardiovascular;  Laterality: N/A;   cysloscopy  8/08   EYE SURGERY     HERNIA REPAIR  1997   right   herninated disc  1995   PACEMAKER INSERTION  11/03/2020   PROSTATE BIOPSY  8/08   TONSILLECTOMY AND ADENOIDECTOMY     Patient Active Problem List   Diagnosis Date Noted   Ataxia 03/05/2024   Lightheadedness 03/05/2024   Prolonged QT interval 01/22/2024   Acquired  hypothyroidism 01/22/2024   Purpura senilis 06/27/2022   Pacemaker 11/03/2020   Essential hypertension 01/25/2020   Osteoarthritis of spine with radiculopathy, lumbar region 01/08/2018   Aortic atherosclerosis 05/02/2017   Thrombocytopenia 07/10/2016   Cervical spondylosis without myelopathy 04/25/2016   Hereditary and idiopathic peripheral neuropathy 10/16/2015   Allergic rhinitis 05/06/2014   Depression 08/03/2013   Sick sinus syndrome (HCC) 07/31/2012   Hypertensive heart disease without CHF    Chronic atrial fibrillation (HCC)    Coronary artery disease involving native coronary artery of native heart with angina pectoris    History of TIAs    GERD (gastroesophageal reflux disease)    Chronic anticoagulation    Benign prostatic hyperplasia    Mixed hyperlipidemia     ONSET DATE: 2 years ago  REFERRING DIAG: R27.0 (ICD-10-CM) - Ataxia, unspecified  THERAPY DIAG:  Dizziness and giddiness  Other abnormalities of gait and mobility  Muscle weakness (generalized)  Rationale for Evaluation and Treatment: Rehabilitation  SUBJECTIVE:  SUBJECTIVE STATEMENT: Pt states he's been doing his exercises -- not as  dizzy lately.    Pt accompanied by: self  PERTINENT HISTORY: Back 1995, heart bypass 1999, pacemaker  PAIN:  Are you having pain? No  PRECAUTIONS: None  RED FLAGS: None   WEIGHT BEARING RESTRICTIONS: No  FALLS: Has patient fallen in last 6 months? No  LIVING ENVIRONMENT: Lives with: lives with their family; son and grandson Lives in: House/apartment Stairs: 3 steps to enter with hand rail Has following equipment at home: Single point cane  PLOF: Independent  PATIENT GOALS: Improve dizziness with walking and turning  OBJECTIVE:  Note: Objective measures were completed at  Evaluation unless otherwise noted.  DIAGNOSTIC FINDINGS: MRI 11/09/24: No acute or reversible finding. Moderate chronic small-vessel ischemic changes of the cerebral hemispheric white matter. Old bilateral thalamic infarctions.   COGNITION: Overall cognitive status: Within functional limits for tasks assessed   SENSATION: Neuropathy in bilat feet all the way up to the calf (worse on the L); feels it can go up to the hip   POSTURE: rounded shoulders, forward head, and increased thoracic kyphosis   LOWER EXTREMITY MMT:    MMT Right Eval Left Eval  Hip flexion 4- 4-  Hip extension    Hip abduction 3+ 3+  Hip adduction 4- 4-  Hip internal rotation    Hip external rotation    Knee flexion 4 4  Knee extension 5 5  Ankle dorsiflexion    Ankle plantarflexion    Ankle inversion    Ankle eversion    (Blank rows = not tested)  VESTIBULAR ASSESSMENT:    SYMPTOM BEHAVIOR:  Non-Vestibular symptoms: changes in vision and neck pain  Type of dizziness: Unsteady with head/body turns  Frequency: daily  Duration: constant  Aggravating factors: Induced by motion: occur when walking and turning body quickly  Relieving factors: head stationary, lying supine, and slow movements  Progression of symptoms: worse  OCULOMOTOR EXAM:  Ocular Alignment: normal  Ocular ROM: No Limitations  Spontaneous Nystagmus: none  Gaze-Induced Nystagmus: not completely on target but no nystagmus  Smooth Pursuits: corrective saccades  Saccades: slow and corrective saccades R>L  FRENZEL - FIXATION SUPRESSED:  Unable to test  VESTIBULAR - OCULAR REFLEX:   Slow VOR: Positive Right and Comment: corrective saccades initially but improved with reps  VOR Cancellation: Normal  Head-Impulse Test: HIT Right: positive HIT Left: negative   POSITIONAL TESTING: (-) in sidelying Dix-Hallpike and horizontal canalith testing   MOTION SENSITIVITY:    Motion Sensitivity Quotient Intensity: 0 = none, 1 =  Lightheaded, 2 = Mild, 3 = Moderate, 4 = Severe, 5 = Vomiting  Intensity  1. Sitting to supine   2. Supine to L side   3. Supine to R side   4. Supine to sitting   5. L Hallpike-Dix   6. Up from L    7. R Hallpike-Dix   8. Up from R    9. Sitting, head  tipped to L knee   10. Head up from L  knee   11. Sitting, head  tipped to R knee   12. Head up from R  knee   13. Sitting head turns x5   14.Sitting head nods x5   15. In stance, 180  turn to L    16. In stance, 180  turn to R     OTHOSTATICS: not done  GAIT: Findings: Comments: Mildly widened BOS, diminished bilat step length, mild compensated trendelenburg  FUNCTIONAL TESTS:  MCTSIB: condition 1: 30 sec, condition 2: 30 sec, condition 3: 30 sec, condition 4: 9 sec 5 times sit to stand: 17.28 sec FGA: 20/30                                                                                                                                TREATMENT DATE:   12/22/24                                     EXERCISE LOG  balance  Exercise Repetitions and Resistance Comments  Nustep Level 4 x 15 minutes    Rockerboard In parallel bars x 5 minutes   Inverted BOSU ball    Foam balance beam Side-stepping and heel/toe walking in parallel bars x 6 minutes   SLS X 2 mins each side   Knee ext 10# x 4 minutes   Ham curls 30# x 4 minutes    XTS resisted walking Blue x 10 forward and backward    12/08/24                                EXERCISE LOG  Exercise Repetitions and Resistance Comments  Nustep Lvl 5 x 10 mins   Smooth pursuit: horizontal and vertical 6x40' walking  Saccades: horizontal and vertical 6x40' walking  VOR x 1 horizontal and vertical 6x40' walking  Corrective saccades 2 targets horizontal and vertical 6x40' walking  Feet apart eyes closed head turns and head nods 2x30 each On airex  Feet apart eyes closed static standing 2x30 On airex               Blank cell = exercise not performed today     11/30/24                                EXERCISE LOG  Exercise Repetitions and Resistance Comments  Nustep Lvl 3 x 10 mins   Smooth pursuit: horizontal and vertical 2x30 each Standing feet together  Saccades: horizontal and vertical 2x30 each Standing feet together  VOR x 1 horizontal and vertical 2x30 each Standing feet together  Feet together eyes closed head turns and head nods 2x30   Feet together eyes closed static standing 2x30   Staggered stance on airex pad with 4# diagonal chops 2x10 each side LOB to the R  Standing feet together on airex with 4# UE flexion 2x10 LOB       Blank cell = exercise not performed today                                    EXERCISE LOG  Exercise Repetitions and  Resistance Comments  Nustep Lvl 3 x 15 mins   Rockerboard 4 mins   Standing Marches Airex x 2 mins   Tandem Gait Airex x 4 mins   Sidestepping Airex x 4 mins   LAQs 4# x 25 reps bil   Seated Marches 4# x 25 reps bil   Seated Ham Curls Green x 25 reps bil    Blank cell = exercise not performed today     PATIENT EDUCATION: Education details: HEP updates Person educated: Patient Education method: Explanation, Demonstration, and Handouts Education comprehension: verbalized understanding, returned demonstration, and needs further education  HOME EXERCISE PROGRAM: Access Code: [facility] URL: [email or web address] Date: 11/30/2024 Prepared by: Gellen April Earnie Starring  Exercises - Standing Feet Together Smooth Pursuit  - 2 x daily - 7 x weekly - 2 sets - 30 sec hold - Standing Horizontal Saccades  - 2 x daily - 7 x weekly - 2 sets - 30 sec hold - Standing Vertical Saccades  - 2 x daily - 7 x weekly - 2 sets - 30 sec hold - Gaze Stability (VOR) x1 Feet Together on Firm Ground With Horizontal Head Turns  - 2 x daily - 7 x weekly - 2 sets - 30 sec hold - Gaze Stability (VOR) x1 Feet Together on Firm Ground With Vertical Head Turns  - 2 x daily - 7 x weekly - 2 sets  - 30 sec hold  GOALS: Goals reviewed with patient? Yes  SHORT TERM GOALS: Target date: 12/10/2024   Pt will be ind with initial HEP Baseline: Goal status: MET  2.  Pt will report no increase in dizziness with all oculomotor movements Baseline:  Goal status: MET  3.  Pt will report decrease in dizziness by >/=25% Baseline:  Goal status: MET    LONG TERM GOALS: Target date: 12/31/2024   Pt will be ind with management and progression of HEP Baseline:  Goal status: INITIAL  2.  Pt will be able to maintain 30 sec on foam with eyes closed (condition 4 of mCTSIB) to demo improved vestibular integration for balance Baseline: 9 sec Goal status: INITIAL  3.  Pt will have improved FGA to >/=26/30 Baseline: 20 Goal status: INITIAL  4.  Pt will have improved 5x STS to </=13 sec to demo increased functional LE strength for reduced fall risk Baseline:  Goal status: INITIAL  5.  Pt will report improved overall dizziness by >/=50% Baseline:  Goal status: INITIAL   ASSESSMENT:  CLINICAL IMPRESSION: The patient did great today and required minimal hand usage with balance activities in the parallel bars and was able to meet all STGs today. Did good with resisted walking as well  OBJECTIVE IMPAIRMENTS: Abnormal gait, decreased activity tolerance, decreased balance, decreased coordination, decreased endurance, decreased mobility, difficulty walking, decreased strength, dizziness, improper body mechanics, and postural dysfunction.   ACTIVITY LIMITATIONS: standing, stairs, transfers, and locomotion level  PARTICIPATION LIMITATIONS: driving, community activity, and yard work  PERSONAL FACTORS: Age, Fitness, Past/current experiences, and Time since onset of injury/illness/exacerbation are also affecting patient's functional outcome.   REHAB POTENTIAL: Good  CLINICAL DECISION MAKING: Evolving/moderate complexity  EVALUATION COMPLEXITY: Moderate  PLAN:  PT FREQUENCY:  2x/week  PT DURATION: 6 weeks  PLANNED INTERVENTIONS: [id number]- PT Re-evaluation, 97750- Physical Performance Testing, 97110-Therapeutic exercises, 97530- Therapeutic activity, [id number]- Neuromuscular re-education, 97535- Self Care, [id number]- Manual therapy, [id number]- Gait training, [phone] (1-2 muscles), [id number] (3+ muscles)- Dry Needling, Patient/Family education, Balance training, and Vestibular training  PLAN FOR NEXT  SESSION: Progress oculomotor and gaze stabilization exercises. Work on core strength and walking balance. Consider resisted walking.    Cataleya Cristina,CHRIS, PTA, DPT 12/22/2024, 4:02 PM  "

## 2024-12-24 ENCOUNTER — Encounter: Payer: Self-pay | Admitting: *Deleted

## 2024-12-24 ENCOUNTER — Ambulatory Visit: Admitting: *Deleted

## 2024-12-24 DIAGNOSIS — R42 Dizziness and giddiness: Secondary | ICD-10-CM | POA: Diagnosis not present

## 2024-12-24 DIAGNOSIS — R2689 Other abnormalities of gait and mobility: Secondary | ICD-10-CM

## 2024-12-24 DIAGNOSIS — M6281 Muscle weakness (generalized): Secondary | ICD-10-CM

## 2024-12-24 NOTE — Therapy (Signed)
 " OUTPATIENT PHYSICAL THERAPY NEURO TREATMENT   Patient Name: Dalton Vaughan MRN: [id number] DOB:November 07, 1929, 89 y.o., male Today's Date: 12/24/2024   PCP: Jolinda Norene HERO, DO REFERRING PROVIDER: Johnanna Camie Mates, PA-C  END OF SESSION:  PT End of Session - 12/24/24 1128     Visit Number 8    Number of Visits 12    Date for Recertification  12/31/24    Authorization Type Medicare    PT Start Time 1100    PT Stop Time 1150    PT Time Calculation (min) 50 min            Past Medical History:  Diagnosis Date   Anal fissure    Arthritis    Atrial fibrillation (HCC)    BPH (benign prostatic hypertrophy)    CAD (coronary artery disease)    Dr. Blanca   Cataract    COVID-19    Depression 2004   Diverticulosis    GERD (gastroesophageal reflux disease)    History of TIAs    Hyperlipidemia    Hypertension    Hypertensive heart disease without CHF    Hypothyroidism    Internal hemorrhoids    Lumbar disc disease    NSTEMI (non-ST elevated myocardial infarction) (HCC) 01/22/2024   Oral bleeding 08/20/2012   Following molar tooth extraction July, 2013   Pneumonia    Ruptured disk 1995   Shingles    Thyroid  disease    Vitamin D  deficiency    Vitreous detachment    Past Surgical History:  Procedure Laterality Date   bilateral cataract surgery  6/07   CARDIAC CATHETERIZATION     CORONARY ARTERY BYPASS GRAFT  12/99    Dr. Army DOWNS ANGIOGRAPHY N/A 01/28/2024   Procedure: CORONARY/GRAFT ANGIOGRAPHY;  Surgeon: Elmira Newman PARAS, MD;  Location: [facility];  Service: Cardiovascular;  Laterality: N/A;   cysloscopy  8/08   EYE SURGERY     HERNIA REPAIR  1997   right   herninated disc  1995   PACEMAKER INSERTION  11/03/2020   PROSTATE BIOPSY  8/08   TONSILLECTOMY AND ADENOIDECTOMY     Patient Active Problem List   Diagnosis Date Noted   Ataxia 03/05/2024   Lightheadedness 03/05/2024   Prolonged QT interval 01/22/2024   Acquired  hypothyroidism 01/22/2024   Purpura senilis 06/27/2022   Pacemaker 11/03/2020   Essential hypertension 01/25/2020   Osteoarthritis of spine with radiculopathy, lumbar region 01/08/2018   Aortic atherosclerosis 05/02/2017   Thrombocytopenia 07/10/2016   Cervical spondylosis without myelopathy 04/25/2016   Hereditary and idiopathic peripheral neuropathy 10/16/2015   Allergic rhinitis 05/06/2014   Depression 08/03/2013   Sick sinus syndrome (HCC) 07/31/2012   Hypertensive heart disease without CHF    Chronic atrial fibrillation (HCC)    Coronary artery disease involving native coronary artery of native heart with angina pectoris    History of TIAs    GERD (gastroesophageal reflux disease)    Chronic anticoagulation    Benign prostatic hyperplasia    Mixed hyperlipidemia     ONSET DATE: 2 years ago  REFERRING DIAG: R27.0 (ICD-10-CM) - Ataxia, unspecified  THERAPY DIAG:  Dizziness and giddiness  Other abnormalities of gait and mobility  Muscle weakness (generalized)  Rationale for Evaluation and Treatment: Rehabilitation  SUBJECTIVE:  SUBJECTIVE STATEMENT: Pt states he's been doing his exercises. No falls   Pt accompanied by: self  PERTINENT HISTORY: Back 1995, heart bypass 1999, pacemaker  PAIN:  Are you having pain? No  PRECAUTIONS: None  RED FLAGS: None   WEIGHT BEARING RESTRICTIONS: No  FALLS: Has patient fallen in last 6 months? No  LIVING ENVIRONMENT: Lives with: lives with their family; son and grandson Lives in: House/apartment Stairs: 3 steps to enter with hand rail Has following equipment at home: Single point cane  PLOF: Independent  PATIENT GOALS: Improve dizziness with walking and turning  OBJECTIVE:  Note: Objective measures were completed at Evaluation  unless otherwise noted.  DIAGNOSTIC FINDINGS: MRI 11/09/24: No acute or reversible finding. Moderate chronic small-vessel ischemic changes of the cerebral hemispheric white matter. Old bilateral thalamic infarctions.   COGNITION: Overall cognitive status: Within functional limits for tasks assessed   SENSATION: Neuropathy in bilat feet all the way up to the calf (worse on the L); feels it can go up to the hip   POSTURE: rounded shoulders, forward head, and increased thoracic kyphosis   LOWER EXTREMITY MMT:    MMT Right Eval Left Eval  Hip flexion 4- 4-  Hip extension    Hip abduction 3+ 3+  Hip adduction 4- 4-  Hip internal rotation    Hip external rotation    Knee flexion 4 4  Knee extension 5 5  Ankle dorsiflexion    Ankle plantarflexion    Ankle inversion    Ankle eversion    (Blank rows = not tested)  VESTIBULAR ASSESSMENT:    SYMPTOM BEHAVIOR:  Non-Vestibular symptoms: changes in vision and neck pain  Type of dizziness: Unsteady with head/body turns  Frequency: daily  Duration: constant  Aggravating factors: Induced by motion: occur when walking and turning body quickly  Relieving factors: head stationary, lying supine, and slow movements  Progression of symptoms: worse  OCULOMOTOR EXAM:  Ocular Alignment: normal  Ocular ROM: No Limitations  Spontaneous Nystagmus: none  Gaze-Induced Nystagmus: not completely on target but no nystagmus  Smooth Pursuits: corrective saccades  Saccades: slow and corrective saccades R>L  FRENZEL - FIXATION SUPRESSED:  Unable to test  VESTIBULAR - OCULAR REFLEX:   Slow VOR: Positive Right and Comment: corrective saccades initially but improved with reps  VOR Cancellation: Normal  Head-Impulse Test: HIT Right: positive HIT Left: negative   POSITIONAL TESTING: (-) in sidelying Dix-Hallpike and horizontal canalith testing   MOTION SENSITIVITY:    Motion Sensitivity Quotient Intensity: 0 = none, 1 = Lightheaded, 2 = Mild,  3 = Moderate, 4 = Severe, 5 = Vomiting  Intensity  1. Sitting to supine   2. Supine to L side   3. Supine to R side   4. Supine to sitting   5. L Hallpike-Dix   6. Up from L    7. R Hallpike-Dix   8. Up from R    9. Sitting, head  tipped to L knee   10. Head up from L  knee   11. Sitting, head  tipped to R knee   12. Head up from R  knee   13. Sitting head turns x5   14.Sitting head nods x5   15. In stance, 180  turn to L    16. In stance, 180  turn to R     OTHOSTATICS: not done  GAIT: Findings: Comments: Mildly widened BOS, diminished bilat step length, mild compensated trendelenburg  FUNCTIONAL TESTS:  MCTSIB: condition  1: 30 sec, condition 2: 30 sec, condition 3: 30 sec, condition 4: 9 sec 5 times sit to stand: 17.28 sec FGA: 20/30                                                                                                                                TREATMENT DATE:   12/24/24                                     EXERCISE LOG  balance  Exercise Repetitions and Resistance Comments  Nustep Level 4 x 17 minutes    Rockerboard In parallel bars x 5 minutes   Inverted BOSU ball    Foam balance beam Side-stepping and heel/toe walking in parallel bars x 6 minutes   SLS X 2 mins each side   Knee ext 10# x 3 minutes   Ham curls 30# x 3 minutes    XTS resisted walking Blue x 10 forward and backward        12/08/24                                EXERCISE LOG  Exercise Repetitions and Resistance Comments  Nustep Lvl 5 x 10 mins   Smooth pursuit: horizontal and vertical 6x40' walking  Saccades: horizontal and vertical 6x40' walking  VOR x 1 horizontal and vertical 6x40' walking  Corrective saccades 2 targets horizontal and vertical 6x40' walking  Feet apart eyes closed head turns and head nods 2x30 each On airex  Feet apart eyes closed static standing 2x30 On airex               Blank cell = exercise not performed today    11/30/24                                 EXERCISE LOG  Exercise Repetitions and Resistance Comments  Nustep Lvl 3 x 10 mins   Smooth pursuit: horizontal and vertical 2x30 each Standing feet together  Saccades: horizontal and vertical 2x30 each Standing feet together  VOR x 1 horizontal and vertical 2x30 each Standing feet together  Feet together eyes closed head turns and head nods 2x30   Feet together eyes closed static standing 2x30   Staggered stance on airex pad with 4# diagonal chops 2x10 each side LOB to the R  Standing feet together on airex with 4# UE flexion 2x10 LOB       Blank cell = exercise not performed today                                    EXERCISE LOG  Exercise Repetitions and Resistance  Comments  Nustep Lvl 3 x 15 mins   Rockerboard 4 mins   Standing Marches Airex x 2 mins   Tandem Gait Airex x 4 mins   Sidestepping Airex x 4 mins   LAQs 4# x 25 reps bil   Seated Marches 4# x 25 reps bil   Seated Ham Curls Green x 25 reps bil    Blank cell = exercise not performed today     PATIENT EDUCATION: Education details: HEP updates Person educated: Patient Education method: Explanation, Demonstration, and Handouts Education comprehension: verbalized understanding, returned demonstration, and needs further education  HOME EXERCISE PROGRAM: Access Code: [facility] URL: [email or web address] Date: 11/30/2024 Prepared by: Gellen April Earnie Starring  Exercises - Standing Feet Together Smooth Pursuit  - 2 x daily - 7 x weekly - 2 sets - 30 sec hold - Standing Horizontal Saccades  - 2 x daily - 7 x weekly - 2 sets - 30 sec hold - Standing Vertical Saccades  - 2 x daily - 7 x weekly - 2 sets - 30 sec hold - Gaze Stability (VOR) x1 Feet Together on Firm Ground With Horizontal Head Turns  - 2 x daily - 7 x weekly - 2 sets - 30 sec hold - Gaze Stability (VOR) x1 Feet Together on Firm Ground With Vertical Head Turns  - 2 x daily - 7 x weekly - 2 sets - 30 sec hold  GOALS: Goals  reviewed with patient? Yes  SHORT TERM GOALS: Target date: 12/10/2024   Pt will be ind with initial HEP Baseline: Goal status: MET  2.  Pt will report no increase in dizziness with all oculomotor movements Baseline:  Goal status: MET  3.  Pt will report decrease in dizziness by >/=25% Baseline:  Goal status: MET    LONG TERM GOALS: Target date: 12/31/2024   Pt will be ind with management and progression of HEP Baseline:  Goal status: INITIAL  2.  Pt will be able to maintain 30 sec on foam with eyes closed (condition 4 of mCTSIB) to demo improved vestibular integration for balance Baseline: 9 sec Goal status: INITIAL  3.  Pt will have improved FGA to >/=26/30 Baseline: 20 Goal status: INITIAL  4.  Pt will have improved 5x STS to </=13 sec to demo increased functional LE strength for reduced fall risk Baseline:  Goal status: INITIAL  5.  Pt will report improved overall dizziness by >/=50% Baseline:  Goal status: INITIAL   ASSESSMENT:  CLINICAL IMPRESSION: The patient arrived today doing fairly well and was able to continue with therex as well as balance act.'s and did well.     OBJECTIVE IMPAIRMENTS: Abnormal gait, decreased activity tolerance, decreased balance, decreased coordination, decreased endurance, decreased mobility, difficulty walking, decreased strength, dizziness, improper body mechanics, and postural dysfunction.   ACTIVITY LIMITATIONS: standing, stairs, transfers, and locomotion level  PARTICIPATION LIMITATIONS: driving, community activity, and yard work  PERSONAL FACTORS: Age, Fitness, Past/current experiences, and Time since onset of injury/illness/exacerbation are also affecting patient's functional outcome.   REHAB POTENTIAL: Good  CLINICAL DECISION MAKING: Evolving/moderate complexity  EVALUATION COMPLEXITY: Moderate  PLAN:  PT FREQUENCY: 2x/week  PT DURATION: 6 weeks  PLANNED INTERVENTIONS: 97164- PT Re-evaluation, 97750-  Physical Performance Testing, 97110-Therapeutic exercises, 97530- Therapeutic activity, [id number]- Neuromuscular re-education, 97535- Self Care, [id number]- Manual therapy, [id number]- Gait training, [phone] (1-2 muscles), 20561 (3+ muscles)- Dry Needling, Patient/Family education, Balance training, and Vestibular training  PLAN FOR NEXT SESSION: Progress oculomotor and gaze stabilization exercises. Work  on core strength and walking balance. Consider resisted walking.    Analyn Matusek,CHRIS, PTA,  12/24/2024, 12:00 PM  "

## 2024-12-28 ENCOUNTER — Ambulatory Visit

## 2024-12-28 ENCOUNTER — Ambulatory Visit: Payer: Self-pay | Admitting: Family Medicine

## 2024-12-28 DIAGNOSIS — R2689 Other abnormalities of gait and mobility: Secondary | ICD-10-CM

## 2024-12-28 DIAGNOSIS — R42 Dizziness and giddiness: Secondary | ICD-10-CM

## 2024-12-28 DIAGNOSIS — M6281 Muscle weakness (generalized): Secondary | ICD-10-CM

## 2024-12-28 NOTE — Therapy (Signed)
 " OUTPATIENT PHYSICAL THERAPY NEURO TREATMENT   Patient Name: Dalton Vaughan MRN: [id number] DOB:Aug 05, 1929, 89 y.o., male Today's Date: 12/28/2024   PCP: Jolinda Norene HERO, DO REFERRING PROVIDER: Johnanna Camie Mates, PA-C  END OF SESSION:  PT End of Session - 12/28/24 1308     Visit Number 9    Number of Visits 12    Date for Recertification  12/31/24    Authorization Type Medicare    PT Start Time 1300    PT Stop Time 1344    PT Time Calculation (min) 44 min            Past Medical History:  Diagnosis Date   Anal fissure    Arthritis    Atrial fibrillation (HCC)    BPH (benign prostatic hypertrophy)    CAD (coronary artery disease)    Dr. Blanca   Cataract    COVID-19    Depression 2004   Diverticulosis    GERD (gastroesophageal reflux disease)    History of TIAs    Hyperlipidemia    Hypertension    Hypertensive heart disease without CHF    Hypothyroidism    Internal hemorrhoids    Lumbar disc disease    NSTEMI (non-ST elevated myocardial infarction) (HCC) 01/22/2024   Oral bleeding 08/20/2012   Following molar tooth extraction July, 2013   Pneumonia    Ruptured disk 1995   Shingles    Thyroid  disease    Vitamin D  deficiency    Vitreous detachment    Past Surgical History:  Procedure Laterality Date   bilateral cataract surgery  6/07   CARDIAC CATHETERIZATION     CORONARY ARTERY BYPASS GRAFT  12/99    Dr. Army DOWNS ANGIOGRAPHY N/A 01/28/2024   Procedure: CORONARY/GRAFT ANGIOGRAPHY;  Surgeon: Elmira Newman PARAS, MD;  Location: [facility];  Service: Cardiovascular;  Laterality: N/A;   cysloscopy  8/08   EYE SURGERY     HERNIA REPAIR  1997   right   herninated disc  1995   PACEMAKER INSERTION  11/03/2020   PROSTATE BIOPSY  8/08   TONSILLECTOMY AND ADENOIDECTOMY     Patient Active Problem List   Diagnosis Date Noted   Ataxia 03/05/2024   Lightheadedness 03/05/2024   Prolonged QT interval 01/22/2024   Acquired  hypothyroidism 01/22/2024   Purpura senilis 06/27/2022   Pacemaker 11/03/2020   Essential hypertension 01/25/2020   Osteoarthritis of spine with radiculopathy, lumbar region 01/08/2018   Aortic atherosclerosis 05/02/2017   Thrombocytopenia 07/10/2016   Cervical spondylosis without myelopathy 04/25/2016   Hereditary and idiopathic peripheral neuropathy 10/16/2015   Allergic rhinitis 05/06/2014   Depression 08/03/2013   Sick sinus syndrome (HCC) 07/31/2012   Hypertensive heart disease without CHF    Chronic atrial fibrillation (HCC)    Coronary artery disease involving native coronary artery of native heart with angina pectoris    History of TIAs    GERD (gastroesophageal reflux disease)    Chronic anticoagulation    Benign prostatic hyperplasia    Mixed hyperlipidemia     ONSET DATE: 2 years ago  REFERRING DIAG: R27.0 (ICD-10-CM) - Ataxia, unspecified  THERAPY DIAG:  Dizziness and giddiness  Other abnormalities of gait and mobility  Muscle weakness (generalized)  Rationale for Evaluation and Treatment: Rehabilitation  SUBJECTIVE:  SUBJECTIVE STATEMENT: Pt denies any pain today.   Pt accompanied by: self  PERTINENT HISTORY: Back 1995, heart bypass 1999, pacemaker  PAIN:  Are you having pain? No  PRECAUTIONS: None  RED FLAGS: None   WEIGHT BEARING RESTRICTIONS: No  FALLS: Has patient fallen in last 6 months? No  LIVING ENVIRONMENT: Lives with: lives with their family; son and grandson Lives in: House/apartment Stairs: 3 steps to enter with hand rail Has following equipment at home: Single point cane  PLOF: Independent  PATIENT GOALS: Improve dizziness with walking and turning  OBJECTIVE:  Note: Objective measures were completed at Evaluation unless otherwise  noted.  DIAGNOSTIC FINDINGS: MRI 11/09/24: No acute or reversible finding. Moderate chronic small-vessel ischemic changes of the cerebral hemispheric white matter. Old bilateral thalamic infarctions.   COGNITION: Overall cognitive status: Within functional limits for tasks assessed   SENSATION: Neuropathy in bilat feet all the way up to the calf (worse on the L); feels it can go up to the hip   POSTURE: rounded shoulders, forward head, and increased thoracic kyphosis   LOWER EXTREMITY MMT:    MMT Right Eval Left Eval  Hip flexion 4- 4-  Hip extension    Hip abduction 3+ 3+  Hip adduction 4- 4-  Hip internal rotation    Hip external rotation    Knee flexion 4 4  Knee extension 5 5  Ankle dorsiflexion    Ankle plantarflexion    Ankle inversion    Ankle eversion    (Blank rows = not tested)  VESTIBULAR ASSESSMENT:    SYMPTOM BEHAVIOR:  Non-Vestibular symptoms: changes in vision and neck pain  Type of dizziness: Unsteady with head/body turns  Frequency: daily  Duration: constant  Aggravating factors: Induced by motion: occur when walking and turning body quickly  Relieving factors: head stationary, lying supine, and slow movements  Progression of symptoms: worse  OCULOMOTOR EXAM:  Ocular Alignment: normal  Ocular ROM: No Limitations  Spontaneous Nystagmus: none  Gaze-Induced Nystagmus: not completely on target but no nystagmus  Smooth Pursuits: corrective saccades  Saccades: slow and corrective saccades R>L  FRENZEL - FIXATION SUPRESSED:  Unable to test  VESTIBULAR - OCULAR REFLEX:   Slow VOR: Positive Right and Comment: corrective saccades initially but improved with reps  VOR Cancellation: Normal  Head-Impulse Test: HIT Right: positive HIT Left: negative   POSITIONAL TESTING: (-) in sidelying Dix-Hallpike and horizontal canalith testing   MOTION SENSITIVITY:    Motion Sensitivity Quotient Intensity: 0 = none, 1 = Lightheaded, 2 = Mild, 3 = Moderate, 4  = Severe, 5 = Vomiting  Intensity  1. Sitting to supine   2. Supine to L side   3. Supine to R side   4. Supine to sitting   5. L Hallpike-Dix   6. Up from L    7. R Hallpike-Dix   8. Up from R    9. Sitting, head  tipped to L knee   10. Head up from L  knee   11. Sitting, head  tipped to R knee   12. Head up from R  knee   13. Sitting head turns x5   14.Sitting head nods x5   15. In stance, 180  turn to L    16. In stance, 180  turn to R     OTHOSTATICS: not done  GAIT: Findings: Comments: Mildly widened BOS, diminished bilat step length, mild compensated trendelenburg  FUNCTIONAL TESTS:  MCTSIB: condition 1: 30 sec, condition  2: 30 sec, condition 3: 30 sec, condition 4: 9 sec 5 times sit to stand: 17.28 sec FGA: 20/30                                                                                                                                TREATMENT DATE:   12/28/24                                   EXERCISE LOG  balance  Exercise Repetitions and Resistance Comments  Nustep Level 4 x 18 minutes    Rockerboard In parallel bars x 5 minutes   Inverted BOSU ball    Foam balance beam Side-stepping and heel/toe walking in parallel bars x 8 minutes   SLS    Knee ext 10# x 4 minutes   Ham curls 30# x 5 minutes    XTS resisted walking Blue x 10 forward and backward        12/08/24                                EXERCISE LOG  Exercise Repetitions and Resistance Comments  Nustep Lvl 5 x 10 mins   Smooth pursuit: horizontal and vertical 6x40' walking  Saccades: horizontal and vertical 6x40' walking  VOR x 1 horizontal and vertical 6x40' walking  Corrective saccades 2 targets horizontal and vertical 6x40' walking  Feet apart eyes closed head turns and head nods 2x30 each On airex  Feet apart eyes closed static standing 2x30 On airex               Blank cell = exercise not performed today    11/30/24                                EXERCISE  LOG  Exercise Repetitions and Resistance Comments  Nustep Lvl 3 x 10 mins   Smooth pursuit: horizontal and vertical 2x30 each Standing feet together  Saccades: horizontal and vertical 2x30 each Standing feet together  VOR x 1 horizontal and vertical 2x30 each Standing feet together  Feet together eyes closed head turns and head nods 2x30   Feet together eyes closed static standing 2x30   Staggered stance on airex pad with 4# diagonal chops 2x10 each side LOB to the R  Standing feet together on airex with 4# UE flexion 2x10 LOB       Blank cell = exercise not performed today     PATIENT EDUCATION: Education details: HEP updates Person educated: Patient Education method: Explanation, Demonstration, and Handouts Education comprehension: verbalized understanding, returned demonstration, and needs further education  HOME EXERCISE PROGRAM: Access Code: [facility] URL: [email or web address] Date: 11/30/2024 Prepared by: Gellen April Earnie Starring  Exercises - Standing Feet Together  Smooth Pursuit  - 2 x daily - 7 x weekly - 2 sets - 30 sec hold - Standing Horizontal Saccades  - 2 x daily - 7 x weekly - 2 sets - 30 sec hold - Standing Vertical Saccades  - 2 x daily - 7 x weekly - 2 sets - 30 sec hold - Gaze Stability (VOR) x1 Feet Together on Firm Ground With Horizontal Head Turns  - 2 x daily - 7 x weekly - 2 sets - 30 sec hold - Gaze Stability (VOR) x1 Feet Together on Firm Ground With Vertical Head Turns  - 2 x daily - 7 x weekly - 2 sets - 30 sec hold  GOALS: Goals reviewed with patient? Yes  SHORT TERM GOALS: Target date: 12/10/2024   Pt will be ind with initial HEP Baseline: Goal status: MET  2.  Pt will report no increase in dizziness with all oculomotor movements Baseline:  Goal status: MET  3.  Pt will report decrease in dizziness by >/=25% Baseline:  Goal status: MET    LONG TERM GOALS: Target date: 12/31/2024   Pt will be ind with management  and progression of HEP Baseline:  Goal status: INITIAL  2.  Pt will be able to maintain 30 sec on foam with eyes closed (condition 4 of mCTSIB) to demo improved vestibular integration for balance Baseline: 9 sec Goal status: INITIAL  3.  Pt will have improved FGA to >/=26/30 Baseline: 20 Goal status: INITIAL  4.  Pt will have improved 5x STS to </=13 sec to demo increased functional LE strength for reduced fall risk Baseline:  Goal status: INITIAL  5.  Pt will report improved overall dizziness by >/=50% Baseline:  Goal status: INITIAL   ASSESSMENT:  CLINICAL IMPRESSION: Pt arrives for today's treatment session denying any pain.  Pt able to tolerate increased time with all previously performed cybex and balance exercises.  Pt denies any pain, but does report increased pain at completion of today's treatment session.   OBJECTIVE IMPAIRMENTS: Abnormal gait, decreased activity tolerance, decreased balance, decreased coordination, decreased endurance, decreased mobility, difficulty walking, decreased strength, dizziness, improper body mechanics, and postural dysfunction.   ACTIVITY LIMITATIONS: standing, stairs, transfers, and locomotion level  PARTICIPATION LIMITATIONS: driving, community activity, and yard work  PERSONAL FACTORS: Age, Fitness, Past/current experiences, and Time since onset of injury/illness/exacerbation are also affecting patient's functional outcome.   REHAB POTENTIAL: Good  CLINICAL DECISION MAKING: Evolving/moderate complexity  EVALUATION COMPLEXITY: Moderate  PLAN:  PT FREQUENCY: 2x/week  PT DURATION: 6 weeks  PLANNED INTERVENTIONS: 97164- PT Re-evaluation, 97750- Physical Performance Testing, 97110-Therapeutic exercises, 97530- Therapeutic activity, [id number]- Neuromuscular re-education, 97535- Self Care, 02859- Manual therapy, [id number]- Gait training, [phone] (1-2 muscles), 20561 (3+ muscles)- Dry Needling, Patient/Family education, Balance training, and  Vestibular training  PLAN FOR NEXT SESSION: Progress oculomotor and gaze stabilization exercises. Work on core strength and walking balance. Consider resisted walking.    Dalton Vaughan, PTA,  12/28/2024, 2:29 PM  "

## 2024-12-31 ENCOUNTER — Ambulatory Visit: Admitting: *Deleted

## 2024-12-31 ENCOUNTER — Encounter: Payer: Self-pay | Admitting: Physical Therapy

## 2024-12-31 DIAGNOSIS — R42 Dizziness and giddiness: Secondary | ICD-10-CM | POA: Diagnosis not present

## 2024-12-31 DIAGNOSIS — R2689 Other abnormalities of gait and mobility: Secondary | ICD-10-CM

## 2024-12-31 DIAGNOSIS — M6281 Muscle weakness (generalized): Secondary | ICD-10-CM

## 2024-12-31 NOTE — Therapy (Signed)
 " OUTPATIENT PHYSICAL THERAPY NEURO TREATMENT AND 10TH VISIT PROGRESS NOTE AND D/C  Progress Note Reporting Period 11/19/24 to 12/31/2024  See note below for Objective Data and Assessment of Progress/Goals.    PHYSICAL THERAPY DISCHARGE SUMMARY  Visits from Start of Care: 10  Current functional level related to goals / functional outcomes: See below   Remaining deficits: Some continued strength deficits -- pt is limited due to chronic R knee pain   Education / Equipment: Final HEP and continuing to work at home   Patient agrees to discharge. Patient goals were met. Patient is being discharged due to being pleased with the current functional level.      Patient Name: Dalton Vaughan MRN: [id number] DOB:02/12/1929, 89 y.o., male Today's Date: 12/31/2024   PCP: Jolinda Norene HERO, DO REFERRING PROVIDER: Johnanna Camie Mates, PA-C  END OF SESSION:  PT End of Session - 12/31/24 1348     Visit Number 10    Number of Visits 12    Date for Recertification  12/31/24    Authorization Type Medicare    PT Start Time 1345    PT Stop Time 1425    PT Time Calculation (min) 40 min    Activity Tolerance Patient tolerated treatment well             Past Medical History:  Diagnosis Date   Anal fissure    Arthritis    Atrial fibrillation (HCC)    BPH (benign prostatic hypertrophy)    CAD (coronary artery disease)    Dr. Blanca   Cataract    COVID-19    Depression 2004   Diverticulosis    GERD (gastroesophageal reflux disease)    History of TIAs    Hyperlipidemia    Hypertension    Hypertensive heart disease without CHF    Hypothyroidism    Internal hemorrhoids    Lumbar disc disease    NSTEMI (non-ST elevated myocardial infarction) (HCC) 01/22/2024   Oral bleeding 08/20/2012   Following molar tooth extraction July, 2013   Pneumonia    Ruptured disk 1995   Shingles    Thyroid  disease    Vitamin D  deficiency    Vitreous detachment    Past Surgical History:   Procedure Laterality Date   bilateral cataract surgery  6/07   CARDIAC CATHETERIZATION     CORONARY ARTERY BYPASS GRAFT  12/99    Dr. Army DOWNS ANGIOGRAPHY N/A 01/28/2024   Procedure: CORONARY/GRAFT ANGIOGRAPHY;  Surgeon: Elmira Newman PARAS, MD;  Location: [facility];  Service: Cardiovascular;  Laterality: N/A;   cysloscopy  8/08   EYE SURGERY     HERNIA REPAIR  1997   right   herninated disc  1995   PACEMAKER INSERTION  11/03/2020   PROSTATE BIOPSY  8/08   TONSILLECTOMY AND ADENOIDECTOMY     Patient Active Problem List   Diagnosis Date Noted   Ataxia 03/05/2024   Lightheadedness 03/05/2024   Prolonged QT interval 01/22/2024   Acquired hypothyroidism 01/22/2024   Purpura senilis 06/27/2022   Pacemaker 11/03/2020   Essential hypertension 01/25/2020   Osteoarthritis of spine with radiculopathy, lumbar region 01/08/2018   Aortic atherosclerosis 05/02/2017   Thrombocytopenia 07/10/2016   Cervical spondylosis without myelopathy 04/25/2016   Hereditary and idiopathic peripheral neuropathy 10/16/2015   Allergic rhinitis 05/06/2014   Depression 08/03/2013   Sick sinus syndrome (HCC) 07/31/2012   Hypertensive heart disease without CHF    Chronic atrial fibrillation (HCC)    Coronary  artery disease involving native coronary artery of native heart with angina pectoris    History of TIAs    GERD (gastroesophageal reflux disease)    Chronic anticoagulation    Benign prostatic hyperplasia    Mixed hyperlipidemia     ONSET DATE: 2 years ago  REFERRING DIAG: R27.0 (ICD-10-CM) - Ataxia, unspecified  THERAPY DIAG:  Dizziness and giddiness  Other abnormalities of gait and mobility  Muscle weakness (generalized)  Rationale for Evaluation and Treatment: Rehabilitation  SUBJECTIVE:                                                                                                                                                                                              SUBJECTIVE STATEMENT: Pt states his dizziness is up/down but in general less.   Pt accompanied by: self  PERTINENT HISTORY: Back 1995, heart bypass 1999, pacemaker  PAIN:  Are you having pain? No  PRECAUTIONS: None  RED FLAGS: None   WEIGHT BEARING RESTRICTIONS: No  FALLS: Has patient fallen in last 6 months? No  LIVING ENVIRONMENT: Lives with: lives with their family; son and grandson Lives in: House/apartment Stairs: 3 steps to enter with hand rail Has following equipment at home: Single point cane  PLOF: Independent  PATIENT GOALS: Improve dizziness with walking and turning  OBJECTIVE:  Note: Objective measures were completed at Evaluation unless otherwise noted.  DIAGNOSTIC FINDINGS: MRI 11/09/24: No acute or reversible finding. Moderate chronic small-vessel ischemic changes of the cerebral hemispheric white matter. Old bilateral thalamic infarctions.   COGNITION: Overall cognitive status: Within functional limits for tasks assessed   SENSATION: Neuropathy in bilat feet all the way up to the calf (worse on the L); feels it can go up to the hip   POSTURE: rounded shoulders, forward head, and increased thoracic kyphosis   LOWER EXTREMITY MMT:    MMT Right Eval Left Eval  Hip flexion 4- 4-  Hip extension    Hip abduction 3+ 3+  Hip adduction 4- 4-  Hip internal rotation    Hip external rotation    Knee flexion 4 4  Knee extension 5 5  Ankle dorsiflexion    Ankle plantarflexion    Ankle inversion    Ankle eversion    (Blank rows = not tested)  VESTIBULAR ASSESSMENT:    SYMPTOM BEHAVIOR:  Non-Vestibular symptoms: changes in vision and neck pain  Type of dizziness: Unsteady with head/body turns  Frequency: daily  Duration: constant  Aggravating factors: Induced by motion: occur when walking and turning body quickly  Relieving factors: head stationary, lying supine, and slow movements  Progression of  symptoms: worse  OCULOMOTOR  EXAM:  Ocular Alignment: normal  Ocular ROM: No Limitations  Spontaneous Nystagmus: none  Gaze-Induced Nystagmus: not completely on target but no nystagmus  Smooth Pursuits: corrective saccades  Saccades: slow and corrective saccades R>L  FRENZEL - FIXATION SUPRESSED:  Unable to test  VESTIBULAR - OCULAR REFLEX:   Slow VOR: Positive Right and Comment: corrective saccades initially but improved with reps  VOR Cancellation: Normal  Head-Impulse Test: HIT Right: positive HIT Left: negative   POSITIONAL TESTING: (-) in sidelying Dix-Hallpike and horizontal canalith testing   MOTION SENSITIVITY:    Motion Sensitivity Quotient Intensity: 0 = none, 1 = Lightheaded, 2 = Mild, 3 = Moderate, 4 = Severe, 5 = Vomiting  Intensity  1. Sitting to supine   2. Supine to L side   3. Supine to R side   4. Supine to sitting   5. L Hallpike-Dix   6. Up from L    7. R Hallpike-Dix   8. Up from R    9. Sitting, head  tipped to L knee   10. Head up from L  knee   11. Sitting, head  tipped to R knee   12. Head up from R  knee   13. Sitting head turns x5   14.Sitting head nods x5   15. In stance, 180  turn to L    16. In stance, 180  turn to R     OTHOSTATICS: not done  GAIT: Findings: Comments: Mildly widened BOS, diminished bilat step length, mild compensated trendelenburg  FUNCTIONAL TESTS:  MCTSIB: condition 1: 30 sec, condition 2: 30 sec, condition 3: 30 sec, condition 4: 9 sec  12/31/24: condition 1: 30 sec, condition 2: 30 sec, condition 3: 30 sec, condition 4: 30 sec 5 times sit to stand: 17.28 sec  12/31/24: 15.43 sec with UE, 13.32 sec with UEs FGA: 20/30   12/31/24: 26                                                                                                                               TREATMENT DATE:  12/31/24                                   EXERCISE LOG  balance  Exercise Repetitions and Resistance Comments  Nustep Level 5 x 10 minutes    Sit to stands  3x5   Wall squats 2x5   Vestibular ball standing on foam CW & CCW x10   Finalized HEP                      12/28/24                                   EXERCISE LOG  balance  Exercise Repetitions and Resistance Comments  Nustep Level 4 x 18 minutes    Rockerboard In parallel bars x 5 minutes   Inverted BOSU ball    Foam balance beam Side-stepping and heel/toe walking in parallel bars x 8 minutes   SLS    Knee ext 10# x 4 minutes   Ham curls 30# x 5 minutes    XTS resisted walking Blue x 10 forward and backward        12/08/24                                EXERCISE LOG  Exercise Repetitions and Resistance Comments  Nustep Lvl 5 x 10 mins   Smooth pursuit: horizontal and vertical 6x40' walking  Saccades: horizontal and vertical 6x40' walking  VOR x 1 horizontal and vertical 6x40' walking  Corrective saccades 2 targets horizontal and vertical 6x40' walking  Feet apart eyes closed head turns and head nods 2x30 each On airex  Feet apart eyes closed static standing 2x30 On airex               Blank cell = exercise not performed today    11/30/24                                EXERCISE LOG  Exercise Repetitions and Resistance Comments  Nustep Lvl 3 x 10 mins   Smooth pursuit: horizontal and vertical 2x30 each Standing feet together  Saccades: horizontal and vertical 2x30 each Standing feet together  VOR x 1 horizontal and vertical 2x30 each Standing feet together  Feet together eyes closed head turns and head nods 2x30   Feet together eyes closed static standing 2x30   Staggered stance on airex pad with 4# diagonal chops 2x10 each side LOB to the R  Standing feet together on airex with 4# UE flexion 2x10 LOB       Blank cell = exercise not performed today     PATIENT EDUCATION: Education details: HEP updates Person educated: Patient Education method: Explanation, Demonstration, and Handouts Education comprehension: verbalized understanding, returned  demonstration, and needs further education  HOME EXERCISE PROGRAM: Access Code: [facility] URL: [email or web address] Date: 11/30/2024 Prepared by: Oluwaferanmi Wain April Earnie Starring  Exercises - Standing Feet Together Smooth Pursuit  - 2 x daily - 7 x weekly - 2 sets - 30 sec hold - Standing Horizontal Saccades  - 2 x daily - 7 x weekly - 2 sets - 30 sec hold - Standing Vertical Saccades  - 2 x daily - 7 x weekly - 2 sets - 30 sec hold - Gaze Stability (VOR) x1 Feet Together on Firm Ground With Horizontal Head Turns  - 2 x daily - 7 x weekly - 2 sets - 30 sec hold - Gaze Stability (VOR) x1 Feet Together on Firm Ground With Vertical Head Turns  - 2 x daily - 7 x weekly - 2 sets - 30 sec hold  GOALS: Goals reviewed with patient? Yes  SHORT TERM GOALS: Target date: 12/10/2024   Pt will be ind with initial HEP Baseline: Goal status: MET  2.  Pt will report no increase in dizziness with all oculomotor movements Baseline:  Goal status: MET  3.  Pt will report decrease in dizziness by >/=25% Baseline:  Goal status: MET    LONG TERM GOALS: Target date: 12/31/2024  Pt will be ind with management and progression of HEP Baseline:  12/31/24: Has been performing consistently at home; verbalizes understanding of final HEP Goal status: MET  2.  Pt will be able to maintain 30 sec on foam with eyes closed (condition 4 of mCTSIB) to demo improved vestibular integration for balance Baseline: 9 sec 12/31/24: 30 sec  Goal status: MET  3.  Pt will have improved FGA to >/=26/30 Baseline: 20 12/31/24: 26 Goal status: MET  4.  Pt will have improved 5x STS to </=13 sec to demo increased functional LE strength for reduced fall risk Baseline:  12/31/24: 13.32 sec with UEs Goal status: PARTIALLY MET  5.  Pt will report improved overall dizziness by >/=50% Baseline:  12/31/24: 20-30%  Goal status: PARTIALLY MET   ASSESSMENT:  CLINICAL IMPRESSION: Rechecked pt's LTGs -- has met or  partially met all of his goals. Still has room to improve LE strength as noted with his 5x STS. Still requires use of UEs for initial push off. Dizziness has improved in general but not fully to goal level.   OBJECTIVE IMPAIRMENTS: Abnormal gait, decreased activity tolerance, decreased balance, decreased coordination, decreased endurance, decreased mobility, difficulty walking, decreased strength, dizziness, improper body mechanics, and postural dysfunction.   ACTIVITY LIMITATIONS: standing, stairs, transfers, and locomotion level  PARTICIPATION LIMITATIONS: driving, community activity, and yard work  PERSONAL FACTORS: Age, Fitness, Past/current experiences, and Time since onset of injury/illness/exacerbation are also affecting patient's functional outcome.   REHAB POTENTIAL: Good  CLINICAL DECISION MAKING: Evolving/moderate complexity  EVALUATION COMPLEXITY: Moderate  PLAN:  PT FREQUENCY: 2x/week  PT DURATION: 6 weeks  PLANNED INTERVENTIONS: [id number]- PT Re-evaluation, 97750- Physical Performance Testing, 97110-Therapeutic exercises, 97530- Therapeutic activity, [id number]- Neuromuscular re-education, 97535- Self Care, [id number]- Manual therapy, [id number]- Gait training, [phone] (1-2 muscles), [id number] (3+ muscles)- Dry Needling, Patient/Family education, Balance training, and Vestibular training  PLAN FOR NEXT SESSION: Progress oculomotor and gaze stabilization exercises. Work on core strength and walking balance. Consider resisted walking.    Polly Barner April Ma L Endiya Klahr, PT, DPT 12/31/2024, 1:49 PM  "

## 2025-01-01 ENCOUNTER — Emergency Department (HOSPITAL_COMMUNITY)
Admission: EM | Admit: 2025-01-01 | Discharge: 2025-01-01 | Disposition: A | Discharge status: Home / Self Care | Attending: Emergency Medicine | Admitting: Emergency Medicine

## 2025-01-01 ENCOUNTER — Emergency Department (HOSPITAL_COMMUNITY)

## 2025-01-01 ENCOUNTER — Ambulatory Visit: Payer: Self-pay

## 2025-01-01 DIAGNOSIS — Z7901 Long term (current) use of anticoagulants: Secondary | ICD-10-CM | POA: Insufficient documentation

## 2025-01-01 DIAGNOSIS — L03115 Cellulitis of right lower limb: Secondary | ICD-10-CM | POA: Diagnosis not present

## 2025-01-01 DIAGNOSIS — I4891 Unspecified atrial fibrillation: Secondary | ICD-10-CM | POA: Diagnosis not present

## 2025-01-01 DIAGNOSIS — Z7902 Long term (current) use of antithrombotics/antiplatelets: Secondary | ICD-10-CM | POA: Insufficient documentation

## 2025-01-01 DIAGNOSIS — Z79890 Hormone replacement therapy (postmenopausal): Secondary | ICD-10-CM | POA: Insufficient documentation

## 2025-01-01 DIAGNOSIS — Z951 Presence of aortocoronary bypass graft: Secondary | ICD-10-CM | POA: Diagnosis not present

## 2025-01-01 DIAGNOSIS — I10 Essential (primary) hypertension: Secondary | ICD-10-CM | POA: Insufficient documentation

## 2025-01-01 DIAGNOSIS — I2510 Atherosclerotic heart disease of native coronary artery without angina pectoris: Secondary | ICD-10-CM | POA: Diagnosis not present

## 2025-01-01 DIAGNOSIS — E039 Hypothyroidism, unspecified: Secondary | ICD-10-CM | POA: Diagnosis not present

## 2025-01-01 DIAGNOSIS — M7989 Other specified soft tissue disorders: Secondary | ICD-10-CM | POA: Diagnosis present

## 2025-01-01 DIAGNOSIS — Z79899 Other long term (current) drug therapy: Secondary | ICD-10-CM | POA: Insufficient documentation

## 2025-01-01 LAB — BASIC METABOLIC PANEL WITH GFR
Anion gap: 11 (ref 5–15)
BUN: 17 mg/dL (ref 8–23)
CO2: 26 mmol/L (ref 22–32)
Calcium: 9 mg/dL (ref 8.9–10.3)
Chloride: 101 mmol/L (ref 98–111)
Creatinine, Ser: 0.7 mg/dL (ref 0.61–1.24)
GFR, Estimated: >60 mL/min
Glucose, Bld: 72 mg/dL (ref 70–99)
Potassium: 3.9 mmol/L (ref 3.5–5.1)
Sodium: 138 mmol/L (ref 135–145)

## 2025-01-01 LAB — CBC WITH DIFFERENTIAL/PLATELET
Abs Immature Granulocytes: 0.01 K/uL (ref 0.00–0.07)
Basophils Absolute: 0 K/uL (ref 0.0–0.1)
Basophils Relative: 0 %
Eosinophils Absolute: 0.1 K/uL (ref 0.0–0.5)
Eosinophils Relative: 1 %
HCT: 34.1 % — ABNORMAL LOW (ref 39.0–52.0)
Hemoglobin: 11 g/dL — ABNORMAL LOW (ref 13.0–17.0)
Immature Granulocytes: 0 %
Lymphocytes Relative: 18 %
Lymphs Abs: 0.9 K/uL (ref 0.7–4.0)
MCH: 30.1 pg (ref 26.0–34.0)
MCHC: 32.3 g/dL (ref 30.0–36.0)
MCV: 93.4 fL (ref 80.0–100.0)
Monocytes Absolute: 0.4 K/uL (ref 0.1–1.0)
Monocytes Relative: 8 %
Neutro Abs: 3.8 K/uL (ref 1.7–7.7)
Neutrophils Relative %: 73 %
Platelets: 122 K/uL — ABNORMAL LOW (ref 150–400)
RBC: 3.65 MIL/uL — ABNORMAL LOW (ref 4.22–5.81)
RDW: 15.3 % (ref 11.5–15.5)
WBC: 5.3 K/uL (ref 4.0–10.5)
nRBC: 0 % (ref 0.0–0.2)

## 2025-01-01 LAB — TSH: TSH: 5.33 u[IU]/mL — ABNORMAL HIGH (ref 0.350–4.500)

## 2025-01-01 MED ORDER — LEVOTHYROXINE SODIUM 50 MCG PO TABS: 50.0000 ug | ORAL_TABLET | Freq: Every day | ORAL | 0 refills | Status: DC

## 2025-01-01 MED ORDER — CEPHALEXIN 500 MG PO CAPS
500.0000 mg | ORAL_CAPSULE | Freq: Once | ORAL | Status: AC
  Administered 2025-01-01: 500 mg via ORAL
  Filled 2025-01-01: qty 1

## 2025-01-01 MED ORDER — CEPHALEXIN 500 MG PO CAPS: 500.0000 mg | ORAL_CAPSULE | Freq: Three times a day (TID) | ORAL | 0 refills | Status: DC

## 2025-01-01 NOTE — ED Triage Notes (Signed)
 Pt c/o leg swelling and redness worsening over several weeks, R>L. Pt states denies hx of DVT, CHF, SOB, known injury, orthopnea.

## 2025-01-01 NOTE — ED Notes (Signed)
 Pt/family received d/c paperwork at this time. After going over the paperwork any questions, comments, or concerns were answered to the best of this nurse's knowledge. The pt/family verbally acknowledged the teachings/instructions.

## 2025-01-01 NOTE — Telephone Encounter (Signed)
 FYI: noted

## 2025-01-01 NOTE — Telephone Encounter (Signed)
 FYI Only or Action Required?: FYI only for provider: ED advised.  Patient was last seen in primary care on 12/18/2024 by Dettinger, Fonda LABOR, MD.  Called Nurse Triage reporting Cellulitis.  Symptoms began a week ago.  Interventions attempted: Nothing.  Symptoms are: unchanged.  Triage Disposition: Go to ED Now (or PCP Triage)  Patient/caregiver understands and will follow disposition?: Yes   Reason for Disposition  [1] Swelling is painful to touch AND [2] fever  Answer Assessment - Initial Assessment Questions Advised ED now.  Patient reports will go to [facility] or [facility] in [place].  Advised call back or ED/911 if symptoms occur/worsen: severe diff breathing, chest pain > 5 min, faint. Patient verbalized understanding.   1. ONSET: When did the swelling start? (e.g., minutes, hours, days)     weekness 2. LOCATION: What part of the leg is swollen?  Are both legs swollen or just one leg?     feet to thigh, swelling tightness, redness above ankle to knee; bilateral 3. SEVERITY: How bad is the swelling? (e.g., localized; mild, moderate, severe)     feet to thigh, swelling tightness, redness above ankle to knee; bilateral 4. REDNESS: Is there redness or signs of infection?     feet to thigh, swelling tightness, redness above ankle to knee; bilateral 5. PAIN: Is the swelling painful to touch? If Yes, ask: How painful is it?   (Scale 1-10; mild, moderate or severe)     3/10 painful touch, movement 6. FEVER: Do you have a fever? If Yes, ask: What is it, how was it measured, and when did it start?      Denies fever chills n/v 7. CAUSE: What do you think is causing the leg swelling?     Change thyroid  med 8. MEDICAL HISTORY: Do you have a history of blood clots (e.g., DVT), cancer, heart failure, kidney disease, or liver failure?  no 10. OTHER SYMPTOMS: Do you have any other symptoms? (e.g., chest pain, difficulty breathing) Denies diff breathing, chest pain,  faint  Protocols used: Leg Swelling and Edema-A-AH Message from [place] B sent at 01/01/2025  8:24 AM EST  Reason for Triage: Possible cellulitis, right leg painful both legs are swelling right leg is unbearable at this tim

## 2025-01-01 NOTE — ED Provider Notes (Signed)
 " [place] EMERGENCY DEPARTMENT AT [facility] Provider Note   CSN: [id number] Arrival date & time: 01/01/25  1115     Patient presents with: Leg Swelling and Cellulitis   Dalton Vaughan is a 89 y.o. male.   Pt is a 89 yo male with pmxh significant for CAD, cellulitis, HTN, hypothyroidism, hld, BPH, afib (on Eliquis ), GERD, and arthritis.  Pt said his right leg has been a little swollen since he had a CABG and they used the veins of his right leg.  However, he's noticed increased swelling.  Pt has had a little redness in the leg as well.  He notes that his pharmacy gave him a new brand of synthroid  that he does not think works as well as his TSH has been climbing slightly.         Prior to Admission medications  Medication Sig Start Date End Date Taking? Authorizing Provider  cephALEXin  (KEFLEX ) 500 MG capsule Take 1 capsule (500 mg total) by mouth 3 (three) times daily. 01/01/25  Yes Dean Clarity, MD  amLODipine  (NORVASC ) 2.5 MG tablet Take 1 tablet (2.5 mg total) by mouth daily. 12/18/24   Dettinger, Fonda LABOR, MD  apixaban  (ELIQUIS ) 5 MG TABS tablet TAKE 1 TABLET TWICE A DAY 08/06/24   Jolinda Potter M, DO  azelastine  (ASTELIN ) 0.1 % nasal spray USE 1 SPRAY IN EACH NOSTRIL TWICE A DAY 04/29/24   Jolinda Potter M, DO  clopidogrel  (PLAVIX ) 75 MG tablet Take 1 tablet (75 mg total) by mouth daily. 01/28/24   Gonfa, Taye T, MD  doxazosin  (CARDURA ) 8 MG tablet TAKE ONE TABLET AT BEDTIME 07/23/24   Jolinda Potter M, DO  ezetimibe  (ZETIA ) 10 MG tablet Take 1 tablet (10 mg total) by mouth daily. 01/29/24   Gonfa, Taye T, MD  isosorbide  mononitrate (IMDUR ) 30 MG 24 hr tablet Take 1 tablet (30 mg total) by mouth daily. 01/29/24   Gonfa, Taye T, MD  Lactobacillus (PROBIOTIC ACIDOPHILUS) CAPS Take 1 capsule by mouth daily.    [provider]  levothyroxine  (SYNTHROID ) 50 MCG tablet Take 1 tablet (50 mcg total) by mouth daily before breakfast. 01/01/25   Niyla Marone, MD   meclizine (ANTIVERT) 25 MG tablet Take 25 mg by mouth daily as needed for dizziness.    [provider]  mirtazapine  (REMERON ) 15 MG tablet TAKE 1 TABLET AT BEDTIME 11/20/24   Jolinda Potter M, DO  Multiple Vitamin (MULTIVITAMIN) tablet Take 1 tablet by mouth daily.    [provider]  pantoprazole  (PROTONIX ) 40 MG tablet Take 1 tablet (40 mg total) by mouth daily. 12/11/24   Jolinda Potter M, DO  ramipril  (ALTACE ) 10 MG capsule TAKE 1 CAPSULE DAILY 03/04/24   Jolinda Potter M, DO  rosuvastatin  (CRESTOR ) 10 MG tablet Take 1 tablet (10 mg total) by mouth every other day. 02/04/24   Jolinda Potter HERO, DO  SIMETHICONE  PO Take 1 tablet by mouth as needed (gas).    [provider]  vitamin C (ASCORBIC ACID) 500 MG tablet Take 500 mg by mouth daily.     [provider]    Allergies: Paxil [paroxetine hcl], Codeine, Eliquis  [apixaban ], Pravachol [pravastatin sodium], Zetia  [ezetimibe ], Penicillins, and Vytorin [ezetimibe -simvastatin]    Review of Systems  Cardiovascular:  Positive for leg swelling.  All other systems reviewed and are negative.   Updated Vital Signs BP 132/76 (BP Location: Left Arm)   Pulse 86   Temp 98.2 F (36.8 C)   Resp 18  Ht 6' (1.829 m)   Wt 77.1 kg   SpO2 100%   BMI 23.06 kg/m   Physical Exam Vitals and nursing note reviewed.  Constitutional:      Appearance: Normal appearance.  HENT:     Head: Normocephalic and atraumatic.     Right Ear: External ear normal.     Left Ear: External ear normal.     Nose: Nose normal.     Mouth/Throat:     Mouth: Mucous membranes are moist.     Pharynx: Oropharynx is clear.  Eyes:     Extraocular Movements: Extraocular movements intact.     Conjunctiva/sclera: Conjunctivae normal.     Pupils: Pupils are equal, round, and reactive to light.  Cardiovascular:     Rate and Rhythm: Normal rate. Rhythm irregular.     Pulses: Normal pulses.     Heart sounds: Normal heart sounds.   Pulmonary:     Effort: Pulmonary effort is normal.     Breath sounds: Normal breath sounds.  Musculoskeletal:     Cervical back: Normal range of motion and neck supple.     Right lower leg: Edema present.     Comments: R leg is swollen and is a little red.  Foot nl.  Skin:    General: Skin is warm.     Capillary Refill: Capillary refill takes less than 2 seconds.  Neurological:     General: No focal deficit present.     Mental Status: He is alert and oriented to person, place, and time.     (all labs ordered are listed, but only abnormal results are displayed) Labs Reviewed  CBC WITH DIFFERENTIAL/PLATELET - Abnormal; Notable for the following components:      Result Value   RBC 3.65 (*)    Hemoglobin 11.0 (*)    HCT 34.1 (*)    Platelets 122 (*)    All other components within normal limits  TSH - Abnormal; Notable for the following components:   TSH 5.330 (*)    All other components within normal limits  BASIC METABOLIC PANEL WITH GFR    EKG: None  Radiology: US  Venous Img Lower Unilateral Right Result Date: 01/01/2025 CLINICAL DATA:  Right leg swelling for approximately 1 month. Patient on Eliquis . Previous MI and history of hypertension. EXAM: Right LOWER EXTREMITY VENOUS DOPPLER ULTRASOUND TECHNIQUE: Gray-scale sonography with compression, as well as color and duplex ultrasound, were performed to evaluate the deep venous system(s) from the level of the common femoral vein through the popliteal and proximal calf veins. COMPARISON:  None Available. FINDINGS: VENOUS Normal compressibility of the common femoral, superficial femoral, and popliteal veins, as well as the visualized calf veins. Visualized portions of profunda femoral vein and great saphenous vein unremarkable. No filling defects to suggest DVT on grayscale or color Doppler imaging. Doppler waveforms show normal direction of venous flow, normal respiratory plasticity and response to augmentation. Limited views of the  contralateral common femoral vein are unremarkable. OTHER Severe subcutaneous edema below-the-knee. Limitations: none IMPRESSION: Negative for DVT in the right lower extremity. Electronically Signed   By: Cordella Banner   On: 01/01/2025 12:28     Procedures   Medications Ordered in the ED  cephALEXin  (KEFLEX ) capsule 500 mg (500 mg Oral Given 01/01/25 1319)                                    Medical  Decision Making Amount and/or Complexity of Data Reviewed Labs: ordered.  Risk Prescription drug management.   This patient presents to the ED for concern of leg swelling, this involves an extensive number of treatment options, and is a complaint that carries with it a high risk of complications and morbidity.  The differential diagnosis includes cellulitis, dvt, peripheral edema   Co morbidities that complicate the patient evaluation  CAD, cellulitis, HTN, hypothyroidism, hld, BPH, afib (on Eliquis ), GERD, and arthritis   Additional history obtained:  Additional history obtained from epic chart review   Lab Tests:  I Ordered, and personally interpreted labs.  The pertinent results include:  cbc with hgb sl low at 11 (pt runs in the 11s and 12s); bmp nl; tsh sl elevated at 5.33   Imaging Studies ordered:  I ordered imaging studies including US   I independently visualized and interpreted imaging which showed Negative for DVT in the right lower extremity.  I agree with the radiologist interpretation   Medicines ordered and prescription drug management:  I ordered medication including keflex   for cellulitis  Reevaluation of the patient after these medicines showed that the patient improved I have reviewed the patients home medicines and have made adjustments as needed   Test Considered:  us   Problem List / ED Course:  RLE cellulitis:  no DVT.  Leg is more swollen and red than nl.  Pt d/c with keflex . Hypothyroidism:  pt asked for another rx for synthroid  to see if  the other med helps better.  This is sent to pharmacy.   Reevaluation:  After the interventions noted above, I reevaluated the patient and found that they have :improved   Social Determinants of Health:  Lives at home   Dispostion:  After consideration of the diagnostic results and the patients response to treatment, I feel that the patent would benefit from discharge with outpatient f/u.       Final diagnoses:  Cellulitis of right lower extremity  Hypothyroidism, unspecified type    ED Discharge Orders          Ordered    levothyroxine  (SYNTHROID ) 50 MCG tablet  Daily before breakfast        01/01/25 1315    cephALEXin  (KEFLEX ) 500 MG capsule  3 times daily        01/01/25 1315               Dean Clarity, MD 01/01/25 1656  "

## 2025-01-04 ENCOUNTER — Ambulatory Visit

## 2025-01-06 ENCOUNTER — Inpatient Hospital Stay: Attending: Oncology

## 2025-01-06 DIAGNOSIS — D696 Thrombocytopenia, unspecified: Secondary | ICD-10-CM

## 2025-01-06 LAB — CBC WITH DIFFERENTIAL/PLATELET
Abs Immature Granulocytes: 0.01 K/uL (ref 0.00–0.07)
Basophils Absolute: 0 K/uL (ref 0.0–0.1)
Basophils Relative: 0 %
Eosinophils Absolute: 0.2 K/uL (ref 0.0–0.5)
Eosinophils Relative: 3 %
HCT: 34.7 % — ABNORMAL LOW (ref 39.0–52.0)
Hemoglobin: 11.2 g/dL — ABNORMAL LOW (ref 13.0–17.0)
Immature Granulocytes: 0 %
Lymphocytes Relative: 24 %
Lymphs Abs: 1.4 K/uL (ref 0.7–4.0)
MCH: 30.2 pg (ref 26.0–34.0)
MCHC: 32.3 g/dL (ref 30.0–36.0)
MCV: 93.5 fL (ref 80.0–100.0)
Monocytes Absolute: 0.5 K/uL (ref 0.1–1.0)
Monocytes Relative: 9 %
Neutro Abs: 3.5 K/uL (ref 1.7–7.7)
Neutrophils Relative %: 64 %
Platelets: 117 K/uL — ABNORMAL LOW (ref 150–400)
RBC: 3.71 MIL/uL — ABNORMAL LOW (ref 4.22–5.81)
RDW: 15.6 % — ABNORMAL HIGH (ref 11.5–15.5)
WBC: 5.6 K/uL (ref 4.0–10.5)
nRBC: 0 % (ref 0.0–0.2)

## 2025-01-07 ENCOUNTER — Ambulatory Visit: Admitting: Physical Therapy

## 2025-01-13 ENCOUNTER — Inpatient Hospital Stay: Admitting: Oncology

## 2025-01-13 VITALS — BP 143/89 | HR 75 | Temp 97.1°F | Resp 18 | Ht 72.0 in | Wt 166.0 lb

## 2025-01-13 DIAGNOSIS — D696 Thrombocytopenia, unspecified: Secondary | ICD-10-CM

## 2025-01-13 NOTE — Progress Notes (Signed)
 "  [facility] OFFICE PROGRESS NOTE  Jolinda Norene HERO, DO  ASSESSMENT & PLAN:    Assessment & Plan Thrombocytopenia - Platelet count has been intermittently low beginning in 2022 ranging from 122 to normal. - Hematology workup from 06/24/2023 shows no evidence of nutritional deficiencies with normal MMA and copper .  Ferritin, iron saturations and folate are all within normal limits.  CBC shows normal hemoglobin with a platelet count of 149.  No evidence of infection with nonreactive or normal hep B and hep C lab results.  No evidence of connective tissue disorder with negative ANA, rheumatoid factor and LDH.  No evidence of underlying bone marrow infiltrative process with unremarkable immunofixation and protein electrophoresis.  No evidence of monoclonal protein is present. -Etiology likely secondary to immune mediated thrombocytopenia.  -No recent abdominal imaging but did have a CT scan in 2022 which did not reveal any enlarged spleen or liver dysfunction. -Will obtain abdominal imaging and get immature platelet fraction as well as NGS myeloid panel. -We discussed differential diagnosis of mild thrombocytopenia including immune mediated thrombocytopenia versus myelodysplastic syndrome.  Ideally one would need a bone marrow biopsy after age 77 to rule out component of MDS.  Since he has mild thrombocytopenia, may consider observation at this time.  If the platelet count drops < 100,000 consistently, would recommend bone marrow aspiration biopsy.  - Overall, labs are stable but he is slightly more anemic than he was 6 months ago and platelets have dropped to 117.     Orders Placed This Encounter  Procedures   US  Abdomen Complete    Standing Status:   Future    Expected Date:   04/13/2025    Expiration Date:   01/13/2026    Reason for exam::   Low platlets    Preferred imaging location?:   [facility]   Myeloid NGS    Standing Status:   Future    Expected Date:    04/13/2025    Expiration Date:   07/12/2025    Clinical Indication or ICD-10:   Thrombocytopenia/anemia    Specimen Type:   Blood   Lactate dehydrogenase    Standing Status:   Future    Expected Date:   04/13/2025    Expiration Date:   07/12/2025   Copper , serum    Standing Status:   Future    Expected Date:   04/13/2025    Expiration Date:   07/12/2025   Methylmalonic acid, serum    Standing Status:   Future    Expected Date:   04/13/2025    Expiration Date:   07/12/2025   Vitamin B12    Standing Status:   Future    Expected Date:   04/13/2025    Expiration Date:   07/12/2025   Folate    Standing Status:   Future    Expected Date:   04/13/2025    Expiration Date:   07/12/2025   Immature Platelet Fraction    Standing Status:   Future    Expected Date:   04/13/2025    Expiration Date:   07/12/2025   CBC with Differential/Platelet    Standing Status:   Future    Expected Date:   04/13/2025    Expiration Date:   07/12/2025    INTERVAL HISTORY: Patient returns for follow-up for thrombocytopenia.  Since the patient was seen in our clinic, he was evaluated at [facility], ED for leg swelling and cellulitis.  DVT was ruled  out and he was started on Keflex .  Patient reports he has had multiple episodes of right leg cellulitis without any real clear cause.  He does see his PCP tomorrow for follow-up as it is still slightly swollen and red.  He completed antibiotics last Thursday.  He reports an appetite of 100% energy levels are 75%.  Occasionally he has epistaxis when he blows his nose.  Reports dizziness especially when he stands.  PCP sent him for an MRI of his brain which was unremarkable.  We reviewed CBC with differential.  SUMMARY OF HEMATOLOGIC HISTORY: Oncology History   No problem history exists.    1.  Mild intermittent thrombocytopenia: - Patient seen for intermittent mild thrombocytopenia since 2013. - CBC on 06/04/2023 PLT-125.  Normal Hb and WBC.  B12 and folate normal. - Denies  any easy bruising.  Denies any nosebleeds. - He is on Coumadin  for 20 years for chronic A-fib.  He reportedly had dizzy spells with Eliquis  and could not tolerate it when they tried to switch. - CTA PE (11/05/2021): Spleen size normal.  No B symptoms.   2.  Social/family history: - Lives with son and grandson at home.  Independent of ADLs and IADLs.  He served in the [employer] for 27 years and participated in 2 tours in [place].  Exposure to agent orange present.  Quit smoking in 1968.  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 - Half brother had multiple myeloma.  Maternal uncle had lung cancer. CBC    Component Value Date/Time   WBC 5.6 01/06/2025 1057   RBC 3.71 (L) 01/06/2025 1057   HGB 11.2 (L) 01/06/2025 1057   HGB 12.6 (L) 05/05/2024 1017   HGB 12.1 (L) 08/20/2012 1100   HCT 34.7 (L) 01/06/2025 1057   HCT 40.5 05/05/2024 1017   HCT 37.7 (L) 08/20/2012 1100   PLT 117 (L) 01/06/2025 1057   PLT 128 (L) 05/05/2024 1017   MCV 93.5 01/06/2025 1057   MCV 96 05/05/2024 1017   MCV 88.5 08/20/2012 1100   MCH 30.2 01/06/2025 1057   MCHC 32.3 01/06/2025 1057   RDW 15.6 (H) 01/06/2025 1057   RDW 14.2 05/05/2024 1017   RDW 15.3 (H) 08/20/2012 1100   LYMPHSABS 1.4 01/06/2025 1057   LYMPHSABS 1.6 05/05/2024 1017   LYMPHSABS 2.1 08/20/2012 1100   MONOABS 0.5 01/06/2025 1057   MONOABS 0.4 08/20/2012 1100   EOSABS 0.2 01/06/2025 1057   EOSABS 0.1 05/05/2024 1017   BASOSABS 0.0 01/06/2025 1057   BASOSABS 0.0 05/05/2024 1017   BASOSABS 0.0 08/20/2012 1100       Latest Ref Rng & Units 01/01/2025   12:28 PM 12/11/2024    1:58 PM 04/22/2024    9:21 AM  CMP  Glucose 70 - 99 mg/dL 72  91  82   BUN 8 - 23 mg/dL 17  15  12    Creatinine 0.61 - 1.24 mg/dL 9.29  9.26  9.13   Sodium 135 - 145 mmol/L 138  140  141   Potassium 3.5 - 5.1 mmol/L 3.9  3.9  3.7   Chloride 98 - 111 mmol/L 101  101  101   CO2 22 - 32 mmol/L 26  26  25    Calcium  8.9 - 10.3 mg/dL 9.0  9.1  9.8   Total  Protein 6.0 - 8.5 g/dL  6.1  6.4   Total Bilirubin 0.0 - 1.2 mg/dL  0.5  0.6   Alkaline Phos 48 - 129 IU/L  132  117   AST 0 - 40 IU/L  35  25   ALT 0 - 44 IU/L  34  17      Lab Results  Component Value Date   FERRITIN 98 05/05/2024   VITAMINB12 718 05/05/2024    Vitals:   01/13/25 1041 01/13/25 1043  BP: (!) 152/85 (!) 143/89  Pulse: 75   Resp: 18   Temp: (!) 97.1 F (36.2 C)   SpO2: 99%     Review of System:  Review of Systems  Constitutional:  Positive for malaise/fatigue.  HENT:  Positive for nosebleeds.   Cardiovascular:  Positive for leg swelling (Right leg).  Neurological:  Positive for dizziness.    Physical Exam: Physical Exam Constitutional:      Appearance: Normal appearance.  HENT:     Head: Normocephalic and atraumatic.  Eyes:     Pupils: Pupils are equal, round, and reactive to light.  Cardiovascular:     Rate and Rhythm: Normal rate and regular rhythm.     Heart sounds: Normal heart sounds. No murmur heard. Pulmonary:     Effort: Pulmonary effort is normal.     Breath sounds: Normal breath sounds. No wheezing.  Abdominal:     General: Bowel sounds are normal. There is no distension.     Palpations: Abdomen is soft.     Tenderness: There is no abdominal tenderness.  Musculoskeletal:        General: Normal range of motion.     Cervical back: Normal range of motion.  Skin:    General: Skin is warm and dry.     Findings: No rash.  Neurological:     Mental Status: He is alert and oriented to person, place, and time.     Gait: Gait is intact.  Psychiatric:        Mood and Affect: Mood and affect normal.        Cognition and Memory: Memory normal.        Judgment: Judgment normal.      I spent 20 minutes dedicated to the care of this patient (face-to-face and non-face-to-face) on the date of the encounter to include what is described in the assessment and plan.,  Delon Hope, NP 01/13/2025 12:20 PM "

## 2025-01-13 NOTE — Assessment & Plan Note (Addendum)
-   Platelet count has been intermittently low beginning in 2022 ranging from 122 to normal. - Hematology workup from 06/24/2023 shows no evidence of nutritional deficiencies with normal MMA and copper .  Ferritin, iron saturations and folate are all within normal limits.  CBC shows normal hemoglobin with a platelet count of 149.  No evidence of infection with nonreactive or normal hep B and hep C lab results.  No evidence of connective tissue disorder with negative ANA, rheumatoid factor and LDH.  No evidence of underlying bone marrow infiltrative process with unremarkable immunofixation and protein electrophoresis.  No evidence of monoclonal protein is present. -Etiology likely secondary to immune mediated thrombocytopenia.  -No recent abdominal imaging but did have a CT scan in 2022 which did not reveal any enlarged spleen or liver dysfunction. -Will obtain abdominal imaging and get immature platelet fraction as well as NGS myeloid panel. -We discussed differential diagnosis of mild thrombocytopenia including immune mediated thrombocytopenia versus myelodysplastic syndrome.  Ideally one would need a bone marrow biopsy after age 13 to rule out component of MDS.  Since he has mild thrombocytopenia, may consider observation at this time.  If the platelet count drops < 100,000 consistently, would recommend bone marrow aspiration biopsy.  - Overall, labs are stable but he is slightly more anemic than he was 6 months ago and platelets have dropped to 117.

## 2025-01-14 ENCOUNTER — Encounter: Payer: Self-pay | Admitting: Family Medicine

## 2025-01-14 ENCOUNTER — Ambulatory Visit: Admitting: Family Medicine

## 2025-01-14 ENCOUNTER — Ambulatory Visit

## 2025-01-14 VITALS — BP 120/72 | HR 83 | Temp 97.9°F | Ht 72.0 in | Wt 167.8 lb

## 2025-01-14 DIAGNOSIS — L03115 Cellulitis of right lower limb: Secondary | ICD-10-CM | POA: Diagnosis not present

## 2025-01-14 DIAGNOSIS — M7989 Other specified soft tissue disorders: Secondary | ICD-10-CM

## 2025-01-14 DIAGNOSIS — H01005 Unspecified blepharitis left lower eyelid: Secondary | ICD-10-CM | POA: Diagnosis not present

## 2025-01-14 MED ORDER — DOXYCYCLINE HYCLATE 100 MG PO TABS: 100.0000 mg | ORAL_TABLET | Freq: Two times a day (BID) | ORAL | 0 refills | Status: AC

## 2025-01-14 MED ORDER — FUROSEMIDE 40 MG PO TABS: 40.0000 mg | ORAL_TABLET | Freq: Every day | ORAL | 0 refills | Status: AC | PRN

## 2025-01-14 MED ORDER — ERYTHROMYCIN 5 MG/GM OP OINT: 1.0000 | TOPICAL_OINTMENT | Freq: Two times a day (BID) | OPHTHALMIC | 0 refills | Status: AC

## 2025-01-14 NOTE — Progress Notes (Signed)
 "  Subjective: RR:rzoolopupd PCP: Jolinda Norene HERO, DO HPI:Dalton Vaughan is a 89 y.o. male presenting to clinic today for:  Patient was seen in the ER on 01/01/2025 for cellulitis.  He had some right lower extremity swelling so they evaluated him for possible DVT and imaging was negative.  He is compliant with all medications including his blood pressure medicines and anticoagulants.  No missed doses.  He does report that after Keflex , which was stopped last week, things have gotten slightly better but he still has persistent erythema, warmth and swelling of that right lower extremity.  He reports no fevers, chills.  He does report some unusual tearing of his left eye that started with initiation of antibiotics.  He started utilizing a lubricating eyedrop last evening but notes some tenderness along the left lower lid.   ROS: Per HPI  Allergies[1] Past Medical History:  Diagnosis Date   Anal fissure    Arthritis    Atrial fibrillation (HCC)    BPH (benign prostatic hypertrophy)    CAD (coronary artery disease)    Dr. Blanca   Cataract    COVID-19    Depression 2004   Diverticulosis    GERD (gastroesophageal reflux disease)    History of TIAs    Hyperlipidemia    Hypertension    Hypertensive heart disease without CHF    Hypothyroidism    Internal hemorrhoids    Lumbar disc disease    NSTEMI (non-ST elevated myocardial infarction) (HCC) 01/22/2024   Oral bleeding 08/20/2012   Following molar tooth extraction July, 2013   Pneumonia    Ruptured disk 1995   Shingles    Thyroid  disease    Vitamin D  deficiency    Vitreous detachment    Current Medications[2] Social History   Socioeconomic History   Marital status: Widowed    Spouse name: [name]    Number of children: 1   Years of education: 12   Highest education level: Not on file  Occupational History   Occupation: Retired    Veterinary Surgeon: Company Secretary x 27 years   Occupation: Retired    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    Comment:  supervisor x 17 years  Tobacco Use   Smoking status: Former    Current packs/day: 0.00    Average packs/day: 1 pack/day for 18.0 years (18.0 ttl pk-yrs)    Types: Cigarettes    Start date: 12/17/1948    Quit date: 12/17/1966    Years since quitting: 58.1   Smokeless tobacco: Never  Vaping Use   Vaping status: Never Used  Substance and Sexual Activity   Alcohol use: Yes    Alcohol/week: 1.0 standard drink of alcohol    Types: 1 Standard drinks or equivalent per week    Comment: rare   Drug use: No   Sexual activity: Not Currently  Other Topics Concern   Not on file  Social History Narrative   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-handed      Caffeine use: none      Son and grandson live with him   Social Drivers of Health   Tobacco Use: Medium Risk (12/31/2024)   Patient History    Smoking Tobacco Use: Former    Smokeless Tobacco Use: Never    Passive Exposure: Not on file  Financial Resource Strain: Low Risk (03/05/2024)   Overall Financial Resource Strain (CARDIA)    Difficulty of Paying Living Expenses: Not very  hard  Food Insecurity: No Food Insecurity (03/05/2024)   Hunger Vital Sign    Worried About Running Out of Food in the Last Year: Never true    Ran Out of Food in the Last Year: Never true  Transportation Needs: No Transportation Needs (03/05/2024)   PRAPARE - Administrator, Civil Service (Medical): No    Lack of Transportation (Non-Medical): No  Physical Activity: Sufficiently Active (03/05/2024)   Exercise Vital Sign    Days of Exercise per Week: 7 days    Minutes of Exercise per Session: 70 min  Stress: No Stress Concern Present (03/05/2024)   [employer] of Occupational Health - Occupational Stress Questionnaire    Feeling of Stress : Not at all  Social Connections: Socially Isolated (03/05/2024)   Social Connection and Isolation Panel    Frequency of Communication with Friends and Family: More than  three times a week    Frequency of Social Gatherings with Friends and Family: Once a week    Attends Religious Services: Never    Database Administrator or Organizations: No    Attends Banker Meetings: Never    Marital Status: Widowed  Intimate Partner Violence: Not At Risk (03/05/2024)   Humiliation, Afraid, Rape, and Kick questionnaire    Fear of Current or Ex-Partner: No    Emotionally Abused: No    Physically Abused: No    Sexually Abused: No  Depression (PHQ2-9): Low Risk (01/13/2025)   Depression (PHQ2-9)    PHQ-2 Score: 0  Alcohol Screen: Low Risk (03/05/2024)   Alcohol Screen    Last Alcohol Screening Score (AUDIT): 4  Housing: Low Risk (03/05/2024)   Housing Stability Vital Sign    Unable to Pay for Housing in the Last Year: No    Number of Times Moved in the Last Year: 0    Homeless in the Last Year: No  Utilities: Not At Risk (03/05/2024)   AHC Utilities    Threatened with loss of utilities: No  Health Literacy: Adequate Health Literacy (03/05/2024)   B1300 Health Literacy    Frequency of need for help with medical instructions: Never   Family History  Problem Relation Age of Onset   Dementia Mother    Arthritis Mother    Cancer Brother        prostate   Asthma Sister    Heart disease Paternal Aunt    Heart disease Paternal Uncle    Hypertension Maternal Grandmother    CVA Maternal Grandmother    CVA Paternal Grandmother    Hepatitis C Son    Arthritis Son    Colon cancer Neg Hx    Colon polyps Neg Hx    Kidney disease Neg Hx    Esophageal cancer Neg Hx    Gallbladder disease Neg Hx    Diabetes Neg Hx     Objective: Office vital signs reviewed. BP 120/72 Comment: at home reading per pt  Pulse 83   Temp 97.9 F (36.6 C) (Temporal)   Ht 6' (1.829 m)   Wt 167 lb 12.8 oz (76.1 kg)   SpO2 96%   BMI 22.76 kg/m   Physical Examination:  General: Awake, alert, well nourished, No acute distress HEENT: Sclera white.  He does have some  irritation of the left lower lid.  No purulent material appreciated Extremities: Right lower extremity with marked edema to mid shin compared to the left.  He does have warmth and erythema in the lower leg.  Assessment/ Plan: 89 y.o. male   Cellulitis of right leg - Plan: doxycycline  (VIBRA -TABS) 100 MG tablet  Right leg swelling - Plan: furosemide  (LASIX ) 40 MG tablet  Blepharitis of left lower eyelid, unspecified type - Plan: erythromycin  ophthalmic ointment   Persistent evidence of cellulitis in the right lower extremity with ongoing edema.  I placed him on a short burst of Lasix  for the next several days and placed him on doxycycline  to cover for any staph etiology.  Discussed monitoring his blood pressure, which is elevated during today's visit.  It typically is well-controlled so this is a little unusual for him.  May need to consider advancing the Norvasc  if blood pressure remains uncontrolled  Erythromycin  eye ointment sent for blepharitis of that left lower lid.  Encouraged continued use of lubricating eyedrops.  Follow-up with ophthalmology if symptoms are unresolving   Norene CHRISTELLA Fielding, DO [facility] [phone]     [1]  Allergies Allergen Reactions   Paxil [Paroxetine Hcl] Palpitations   Codeine Other (See Comments)   Eliquis  [Apixaban ] Other (See Comments)    dizziness   Pravachol [Pravastatin Sodium] Other (See Comments)    myalgias   Zetia  [Ezetimibe ] Other (See Comments)    myalgias   Penicillins Hives, Rash and Other (See Comments)    Happened in 1959   Vytorin [Ezetimibe -Simvastatin] Other (See Comments)    myalgias  [2]  Current Outpatient Medications:    amLODipine  (NORVASC ) 2.5 MG tablet, Take 1 tablet (2.5 mg total) by mouth daily., Disp: 90 tablet, Rfl: 1   apixaban  (ELIQUIS ) 5 MG TABS tablet, TAKE 1 TABLET TWICE A DAY, Disp: 180 tablet, Rfl: 0   azelastine  (ASTELIN ) 0.1 % nasal spray, USE 1 SPRAY IN EACH NOSTRIL TWICE A  DAY, Disp: 60 mL, Rfl: 3   clopidogrel  (PLAVIX ) 75 MG tablet, Take 1 tablet (75 mg total) by mouth daily., Disp: 90 tablet, Rfl: 2   doxazosin  (CARDURA ) 8 MG tablet, TAKE ONE TABLET AT BEDTIME, Disp: 30 tablet, Rfl: 0   ezetimibe  (ZETIA ) 10 MG tablet, Take 1 tablet (10 mg total) by mouth daily., Disp: 90 tablet, Rfl: 2   isosorbide  mononitrate (IMDUR ) 30 MG 24 hr tablet, Take 1 tablet (30 mg total) by mouth daily., Disp: 90 tablet, Rfl: 1   Lactobacillus (PROBIOTIC ACIDOPHILUS) CAPS, Take 1 capsule by mouth daily., Disp: , Rfl:    levothyroxine  (SYNTHROID ) 50 MCG tablet, Take 1 tablet (50 mcg total) by mouth daily before breakfast., Disp: 30 tablet, Rfl: 0   meclizine (ANTIVERT) 25 MG tablet, Take 25 mg by mouth daily as needed for dizziness., Disp: , Rfl:    mirtazapine  (REMERON ) 15 MG tablet, TAKE 1 TABLET AT BEDTIME, Disp: 30 tablet, Rfl: 0   Multiple Vitamin (MULTIVITAMIN) tablet, Take 1 tablet by mouth daily., Disp: , Rfl:    pantoprazole  (PROTONIX ) 40 MG tablet, Take 1 tablet (40 mg total) by mouth daily., Disp: 100 tablet, Rfl: 3   ramipril  (ALTACE ) 10 MG capsule, TAKE 1 CAPSULE DAILY, Disp: 90 capsule, Rfl: 3   rosuvastatin  (CRESTOR ) 10 MG tablet, Take 1 tablet (10 mg total) by mouth every other day., Disp: 90 tablet, Rfl: 0   SIMETHICONE  PO, Take 1 tablet by mouth as needed (gas)., Disp: , Rfl:    vitamin C (ASCORBIC ACID) 500 MG tablet, Take 500 mg by mouth daily. , Disp: , Rfl:   "

## 2025-01-18 ENCOUNTER — Other Ambulatory Visit: Payer: Self-pay

## 2025-01-18 MED ORDER — DOXAZOSIN MESYLATE 8 MG PO TABS: 8.0000 mg | ORAL_TABLET | Freq: Every day | ORAL | 1 refills | Status: AC

## 2025-01-19 MED ORDER — LEVOTHYROXINE SODIUM 50 MCG PO TABS: 50.0000 ug | ORAL_TABLET | Freq: Every day | ORAL | 1 refills | Status: AC

## 2025-01-20 ENCOUNTER — Ambulatory Visit: Payer: Self-pay

## 2025-01-20 ENCOUNTER — Ambulatory Visit: Admitting: Family Medicine

## 2025-01-20 ENCOUNTER — Encounter: Payer: Self-pay | Admitting: Family Medicine

## 2025-01-20 VITALS — BP 159/79 | HR 86 | Temp 97.7°F | Ht 73.0 in | Wt 165.2 lb

## 2025-01-20 DIAGNOSIS — L03116 Cellulitis of left lower limb: Secondary | ICD-10-CM

## 2025-01-20 DIAGNOSIS — L03115 Cellulitis of right lower limb: Secondary | ICD-10-CM

## 2025-01-20 DIAGNOSIS — L282 Other prurigo: Secondary | ICD-10-CM

## 2025-01-20 MED ORDER — TRIAMCINOLONE ACETONIDE 0.1 % EX CREA: 1.0000 | TOPICAL_CREAM | Freq: Two times a day (BID) | CUTANEOUS | 0 refills | Status: AC

## 2025-01-20 MED ORDER — CEFTRIAXONE SODIUM 1 G IJ SOLR
1.0000 g | Freq: Once | INTRAMUSCULAR | Status: AC
  Administered 2025-01-20: 1 g via INTRAMUSCULAR

## 2025-01-20 NOTE — Patient Instructions (Signed)
Equate Eczema Cream

## 2025-01-20 NOTE — Progress Notes (Signed)
 "    Subjective:  Patient ID: Dalton Vaughan, male    DOB: 10/23/1929, 89 y.o.   MRN: [id number]  Patient Care Team: Jolinda Norene HERO, DO as PCP - General (Family Medicine) Monetta Redell PARAS, MD as PCP - Cardiology (Cardiology) Renda Glance, MD as Consulting Physician (Urology) Pyrtle, Gordy HERO, MD as Consulting Physician (Gastroenterology) Octavia Charleston, MD as Consulting Physician (Ophthalmology) Lilian Pierre Marsa Raddle., MD as Referring Physician (Cardiology)   Chief Complaint:  Cellulitis (Bilateral lower leg swelling that has been going on since christmas  )   HPI: Dalton Vaughan is a 89 y.o. male presenting on 01/20/2025 for Cellulitis (Bilateral lower leg swelling that has been going on since christmas  )    Dalton Vaughan is a 89 year old male with recurrent cellulitis who presents with worsening leg swelling and pain.  He experiences swelling and pain in his legs, particularly the right leg, which is more swollen than the left. He is on the seventh day of doxycycline  for cellulitis, taking one dose in the morning and one at night. Initially, there was improvement in swelling and redness, but symptoms worsened yesterday, with significant pain and swelling in the left foot, making it difficult to wear a shoe. Today, the pain in the left foot has decreased.  He recalls being prescribed Keflex  prior to doxycycline  and has experienced cellulitis three times in the past, with previous treatments including Unna boot application. He is not using compression socks currently. No shortness of breath, fever, or chills. He noticed a temporary increase in abdominal swelling, which he attributes to a possible reaction to medication, but this has partially resolved.  He also has a rash with 'little red bumps' resembling eczema, for which he uses triamcinolone  cream. His current medications include doxycycline , doxazosin , and levothyroxine .          Relevant past medical, surgical, family, and social  history reviewed and updated as indicated.  Allergies and medications reviewed and updated. Data reviewed: Chart in Epic.   Past Medical History:  Diagnosis Date   Anal fissure    Arthritis    Atrial fibrillation (HCC)    BPH (benign prostatic hypertrophy)    CAD (coronary artery disease)    Dr. Blanca   Cataract    COVID-19    Depression 2004   Diverticulosis    GERD (gastroesophageal reflux disease)    History of TIAs    Hyperlipidemia    Hypertension    Hypertensive heart disease without CHF    Hypothyroidism    Internal hemorrhoids    Lumbar disc disease    NSTEMI (non-ST elevated myocardial infarction) (HCC) 01/22/2024   Oral bleeding 08/20/2012   Following molar tooth extraction July, 2013   Pneumonia    Ruptured disk 1995   Shingles    Thyroid  disease    Vitamin D  deficiency    Vitreous detachment     Past Surgical History:  Procedure Laterality Date   bilateral cataract surgery  6/07   CARDIAC CATHETERIZATION     CORONARY ARTERY BYPASS GRAFT  12/99    Dr. Army DOWNS ANGIOGRAPHY N/A 01/28/2024   Procedure: CORONARY/GRAFT ANGIOGRAPHY;  Surgeon: Elmira Newman PARAS, MD;  Location: [facility];  Service: Cardiovascular;  Laterality: N/A;   cysloscopy  8/08   EYE SURGERY     HERNIA REPAIR  1997   right   herninated disc  1995   PACEMAKER INSERTION  11/03/2020   PROSTATE BIOPSY  8/08  TONSILLECTOMY AND ADENOIDECTOMY      Social History   Socioeconomic History   Marital status: Widowed    Spouse name: [name]    Number of children: 1   Years of education: 12   Highest education level: Not on file  Occupational History   Occupation: Retired    Veterinary Surgeon: Company Secretary x 27 years   Occupation: Retired    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    Comment: supervisor x 17 years  Tobacco Use   Smoking status: Former    Current packs/day: 0.00    Average packs/day: 1 pack/day for 18.0 years (18.0 ttl pk-yrs)    Types: Cigarettes    Start date:  12/17/1948    Quit date: 12/17/1966    Years since quitting: 58.1   Smokeless tobacco: Never  Vaping Use   Vaping status: Never Used  Substance and Sexual Activity   Alcohol use: Yes    Alcohol/week: 1.0 standard drink of alcohol    Types: 1 Standard drinks or equivalent per week    Comment: rare   Drug use: No   Sexual activity: Not Currently  Other Topics Concern   Not on file  Social History Narrative   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-handed      Caffeine use: none      Son and grandson live with him   Social Drivers of Health   Tobacco Use: Medium Risk (01/20/2025)   Patient History    Smoking Tobacco Use: Former    Smokeless Tobacco Use: Never    Passive Exposure: Not on Actuary Strain: Low Risk (03/05/2024)   Overall Financial Resource Strain (CARDIA)    Difficulty of Paying Living Expenses: Not very hard  Food Insecurity: No Food Insecurity (03/05/2024)   Hunger Vital Sign    Worried About Running Out of Food in the Last Year: Never true    Ran Out of Food in the Last Year: Never true  Transportation Needs: No Transportation Needs (03/05/2024)   PRAPARE - Administrator, Civil Service (Medical): No    Lack of Transportation (Non-Medical): No  Physical Activity: Sufficiently Active (03/05/2024)   Exercise Vital Sign    Days of Exercise per Week: 7 days    Minutes of Exercise per Session: 70 min  Stress: No Stress Concern Present (03/05/2024)   [employer] of Occupational Health - Occupational Stress Questionnaire    Feeling of Stress : Not at all  Social Connections: Socially Isolated (03/05/2024)   Social Connection and Isolation Panel    Frequency of Communication with Friends and Family: More than three times a week    Frequency of Social Gatherings with Friends and Family: Once a week    Attends Religious Services: Never    Database Administrator or Organizations: No    Attends Occupational Hygienist Meetings: Never    Marital Status: Widowed  Intimate Partner Violence: Not At Risk (03/05/2024)   Humiliation, Afraid, Rape, and Kick questionnaire    Fear of Current or Ex-Partner: No    Emotionally Abused: No    Physically Abused: No    Sexually Abused: No  Depression (PHQ2-9): Low Risk (01/14/2025)   Depression (PHQ2-9)    PHQ-2 Score: 0  Alcohol Screen: Low Risk (03/05/2024)   Alcohol Screen    Last Alcohol Screening Score (AUDIT): 4  Housing: Low Risk (03/05/2024)   Housing Stability Vital Sign  Unable to Pay for Housing in the Last Year: No    Number of Times Moved in the Last Year: 0    Homeless in the Last Year: No  Utilities: Not At Risk (03/05/2024)   AHC Utilities    Threatened with loss of utilities: No  Health Literacy: Adequate Health Literacy (03/05/2024)   B1300 Health Literacy    Frequency of need for help with medical instructions: Never    Outpatient Encounter Medications as of 01/20/2025  Medication Sig   amLODipine  (NORVASC ) 2.5 MG tablet Take 1 tablet (2.5 mg total) by mouth daily.   apixaban  (ELIQUIS ) 5 MG TABS tablet TAKE 1 TABLET TWICE A DAY   azelastine  (ASTELIN ) 0.1 % nasal spray USE 1 SPRAY IN EACH NOSTRIL TWICE A DAY   clopidogrel  (PLAVIX ) 75 MG tablet Take 1 tablet (75 mg total) by mouth daily.   doxazosin  (CARDURA ) 8 MG tablet Take 1 tablet (8 mg total) by mouth at bedtime.   doxycycline  (VIBRA -TABS) 100 MG tablet Take 1 tablet (100 mg total) by mouth 2 (two) times daily for 10 days.   ezetimibe  (ZETIA ) 10 MG tablet Take 1 tablet (10 mg total) by mouth daily.   furosemide  (LASIX ) 40 MG tablet Take 1 tablet (40 mg total) by mouth daily as needed for fluid.   isosorbide  mononitrate (IMDUR ) 30 MG 24 hr tablet Take 1 tablet (30 mg total) by mouth daily.   Lactobacillus (PROBIOTIC ACIDOPHILUS) CAPS Take 1 capsule by mouth daily.   levothyroxine  (SYNTHROID ) 50 MCG tablet Take 1 tablet (50 mcg total) by mouth daily before breakfast.    meclizine (ANTIVERT) 25 MG tablet Take 25 mg by mouth daily as needed for dizziness.   mirtazapine  (REMERON ) 15 MG tablet TAKE 1 TABLET AT BEDTIME   Multiple Vitamin (MULTIVITAMIN) tablet Take 1 tablet by mouth daily.   pantoprazole  (PROTONIX ) 40 MG tablet Take 1 tablet (40 mg total) by mouth daily.   ramipril  (ALTACE ) 10 MG capsule TAKE 1 CAPSULE DAILY   rosuvastatin  (CRESTOR ) 10 MG tablet Take 1 tablet (10 mg total) by mouth every other day.   SIMETHICONE  PO Take 1 tablet by mouth as needed (gas).   triamcinolone  cream (KENALOG ) 0.1 % Apply 1 Application topically 2 (two) times daily.   vitamin C (ASCORBIC ACID) 500 MG tablet Take 500 mg by mouth daily.    [EXPIRED] cefTRIAXone  (ROCEPHIN ) injection 1 g    No facility-administered encounter medications on file as of 01/20/2025.    Allergies[1]  Pertinent ROS per HPI, otherwise unremarkable      Objective:  BP (!) 159/79   Pulse 86   Temp 97.7 F (36.5 C)   Ht 6' 1 (1.854 m)   Wt 165 lb 3.2 oz (74.9 kg)   SpO2 95%   BMI 21.80 kg/m    Wt Readings from Last 3 Encounters:  01/20/25 165 lb 3.2 oz (74.9 kg)  01/14/25 167 lb 12.8 oz (76.1 kg)  01/13/25 166 lb (75.3 kg)    Physical Exam Vitals and nursing note reviewed.  Constitutional:      General: He is not in acute distress.    Appearance: Normal appearance. He is not ill-appearing, toxic-appearing or diaphoretic.  HENT:     Head: Normocephalic and atraumatic.     Nose: Nose normal.     Mouth/Throat:     Mouth: Mucous membranes are moist.  Eyes:     Conjunctiva/sclera: Conjunctivae normal.     Pupils: Pupils are equal, round, and reactive to  light.  Cardiovascular:     Rate and Rhythm: Normal rate and regular rhythm.     Heart sounds: Normal heart sounds.  Pulmonary:     Effort: Pulmonary effort is normal.     Breath sounds: Normal breath sounds.  Musculoskeletal:     Cervical back: Neck supple.     Right lower leg: Edema present.     Left lower leg: Edema  present.  Skin:    General: Skin is warm and dry.     Capillary Refill: Capillary refill takes less than 2 seconds.     Findings: Erythema and rash present.  Neurological:     General: No focal deficit present.     Mental Status: He is alert and oriented to person, place, and time.  Psychiatric:        Mood and Affect: Mood normal.        Behavior: Behavior normal.        Thought Content: Thought content normal.        Judgment: Judgment normal.    EXTREMITIES: Right leg more swollen than left. Redness on the bottoms of both legs. Redness reduced on right leg. Red bumps resembling eczema rash to left elbow.        Results for orders placed or performed in visit on 01/06/25  CBC with Differential   Collection Time: 01/06/25 10:57 AM  Result Value Ref Range   WBC 5.6 4.0 - 10.5 K/uL   RBC 3.71 (L) 4.22 - 5.81 MIL/uL   Hemoglobin 11.2 (L) 13.0 - 17.0 g/dL   HCT 65.2 (L) 60.9 - 47.9 %   MCV 93.5 80.0 - 100.0 fL   MCH 30.2 26.0 - 34.0 pg   MCHC 32.3 30.0 - 36.0 g/dL   RDW 84.3 (H) 88.4 - 84.4 %   Platelets 117 (L) 150 - 400 K/uL   nRBC 0.0 0.0 - 0.2 %   Neutrophils Relative % 64 %   Neutro Abs 3.5 1.7 - 7.7 K/uL   Lymphocytes Relative 24 %   Lymphs Abs 1.4 0.7 - 4.0 K/uL   Monocytes Relative 9 %   Monocytes Absolute 0.5 0.1 - 1.0 K/uL   Eosinophils Relative 3 %   Eosinophils Absolute 0.2 0.0 - 0.5 K/uL   Basophils Relative 0 %   Basophils Absolute 0.0 0.0 - 0.1 K/uL   Immature Granulocytes 0 %   Abs Immature Granulocytes 0.01 0.00 - 0.07 K/uL       Pertinent labs & imaging results that were available during my care of the patient were reviewed by me and considered in my medical decision making.  Assessment & Plan:  Breon was seen today for cellulitis.  Diagnoses and all orders for this visit:  Cellulitis of right lower extremity -     cefTRIAXone  (ROCEPHIN ) injection 1 g  Cellulitis of left lower extremity -     cefTRIAXone  (ROCEPHIN ) injection 1 g  Pruritic  rash -     triamcinolone  cream (KENALOG ) 0.1 %; Apply 1 Application topically 2 (two) times daily.       Cellulitis of bilateral lower extremities Cellulitis of the bilateral lower extremities with more pronounced swelling and redness on the right side. The condition is improving with doxycycline , but there is a concern for worsening due to increased swelling and pain in the left foot. No systemic symptoms such as fever or shortness of breath. The redness on the right side is transitioning from bright red to maroon, indicating improvement. Compression socks  have not been used, which could aid in reducing swelling. - Administered Rocephin  injection for broad-spectrum antibiotic coverage. - Continue doxycycline  for three more days. - Unna boots bilaterally applied.  - Recommended elevating legs to reduce swelling. - Scheduled follow-up appointment on Friday to assess improvement and consider extending doxycycline  if no significant improvement is noted.  Pruritic rash Likely eczema, with previous prescriptions for triamcinolone  cream. The rash is itchy and painful, and there is a need for effective barrier repair to manage symptoms. - Prescribed triamcinolone  cream for application twice daily. - Recommended using Equate eczema lotion as a barrier repair agent with triamcinolone  cream. - Sent prescription to [facility] for immediate pickup.          Continue all other maintenance medications.  Follow up plan: Return in about 2 days (around 01/22/2025), or if symptoms worsen or fail to improve, for cellulitis of BLE, unna boots.   Continue healthy lifestyle choices, including diet (rich in fruits, vegetables, and lean proteins, and low in salt and simple carbohydrates) and exercise (at least 30 minutes of moderate physical activity daily).  Educational handout given for cellulitis   The above assessment and management plan was discussed with the patient. The patient verbalized  understanding of and has agreed to the management plan. Patient is aware to call the clinic if they develop any new symptoms or if symptoms persist or worsen. Patient is aware when to return to the clinic for a follow-up visit. Patient educated on when it is appropriate to go to the emergency department.   Rosaline Bruns, FNP-C [facility] [phone]     [1]  Allergies Allergen Reactions   Paxil [Paroxetine Hcl] Palpitations   Codeine Other (See Comments)   Eliquis  [Apixaban ] Other (See Comments)    dizziness   Pravachol [Pravastatin Sodium] Other (See Comments)    myalgias   Zetia  [Ezetimibe ] Other (See Comments)    myalgias   Penicillins Hives, Rash and Other (See Comments)    Happened in 1959   Vytorin [Ezetimibe -Simvastatin] Other (See Comments)    myalgias   "

## 2025-01-20 NOTE — Telephone Encounter (Signed)
 FYI Only or Action Required?: FYI only for provider: appointment scheduled on 01/20/2025 11:05 [place] HERO, [facility] - called and spoke with CAL .  Patient was last seen in primary care on 01/14/2025 by Jolinda Norene HERO, DO.  Called Nurse Triage reporting Leg Swelling and infection follow up.  Symptoms began yesterday.  Interventions attempted: Prescription medications:  doxycycline  (VIBRA -TABS) 100 MG tablet .  Symptoms are: gradually worsening.  Triage Disposition: Call PCP Now  Patient/caregiver understands and will follow disposition?: Yes        Reason for Disposition  [1] Taking antibiotics > 24 hours AND [2] symptoms WORSE  Answer Assessment - Initial Assessment Questions Patient reports thought was improving until yesterday. Got up left foot was really hurting bad. All swelled up. Hurt all day still hurting. Right leg swelling goes down some night by middle day swelled up. And by night swollen to knee. Left foot swelled yesterday hard to get shoe on. This morning swelling down in night. Builds up as day goes on. Really hurts around waist fatty deposits. For right leg taking doxycycline   on day 7. Done taking lasix . Around ankle 1/2 way up leg red rosy color.  Right foot very sore . And left one. 4/10 right leg pain. The left foot foot pain and swelling was maybe mild and worse now since visit. Pain 5/10 left foot. Calling CAL per dispo to discuss patient is hoping to be added to schedule with PCP. Called [facility] spoke with Mitsy , booked appointment today 01/20/2025 11:05 AM Rakes, Rock HERO, FNP [facility]. Patient advised of appointment time . Patent will seek ER if worsening meantime    1. INFECTION: What infection is the antibiotic being given for?     Cellulitis for right  leg infection  2. ANTIBIOTIC: What antibiotic are you taking How many times per day?     Plan: doxycycline  (VIBRA -TABS) 100 MG tablet 3. DURATION: When was the  antibiotic started?     01/14/2025 4. MAIN CONCERN OR SYMPTOM:  What is your main concern right now?     Worsening  5. BETTER-SAME-WORSE: Are you getting better, staying the same, or getting worse compared to when you first started the antibiotics? If getting worse, ask: In what way?      Was getting better , getting worse as of yesterday  6. FEVER: Do you have a fever? If Yes, ask: What is your temperature, how was it measured, and when did it start?     Denies  7. SYMPTOMS: Are there any other symptoms you're concerned about? If Yes, ask: When did it start?     Redness rosy red 1/2 way up leg to ankle  swelling pain   Patient denies the following chest pain, shortness of breath beyond baseline, fever, chills, nausea, vomiting , red streaking  Protocols used: Infection on Antibiotic Follow-up Call-[facility]  Message from Frierson T sent at 01/20/2025  8:31 AM EST  Summary: swelling in legs and feet   Reason for Triage: patient called stated his cellulitis is getting worse. Legs painful and swelling from knees down into his feet with a burning sensation. There is some warmth to the touch and patient is on his second round of antibiotic doxycycline .

## 2025-01-20 NOTE — Telephone Encounter (Signed)
 Apt scheduled.

## 2025-01-22 ENCOUNTER — Ambulatory Visit: Admitting: Family Medicine

## 2025-01-22 ENCOUNTER — Encounter: Payer: Self-pay | Admitting: Family Medicine

## 2025-01-22 VITALS — BP 175/92 | HR 88 | Temp 97.6°F | Ht 73.0 in | Wt 167.4 lb

## 2025-01-22 DIAGNOSIS — L03115 Cellulitis of right lower limb: Secondary | ICD-10-CM

## 2025-01-22 DIAGNOSIS — L03116 Cellulitis of left lower limb: Secondary | ICD-10-CM

## 2025-01-22 NOTE — Progress Notes (Signed)
 "    Subjective:  Patient ID: Dalton Vaughan, male    DOB: 04-01-1929, 89 y.o.   MRN: [id number]  Patient Care Team: Jolinda Norene HERO, DO as PCP - General (Family Medicine) Monetta Redell PARAS, MD as PCP - Cardiology (Cardiology) Renda Glance, MD as Consulting Physician (Urology) Pyrtle, Gordy HERO, MD as Consulting Physician (Gastroenterology) Octavia Charleston, MD as Consulting Physician (Ophthalmology) Lilian Pierre Marsa Raddle., MD as Referring Physician (Cardiology)   Chief Complaint:  Cellulitis (2 day follow up- bilateral lower legs)   HPI: Dalton Vaughan is a 89 y.o. male presenting on 01/22/2025 for Cellulitis (2 day follow up- bilateral lower legs)    Dalton Vaughan is a 89 year old male who presents with swelling and cellulitis in the right leg.  He has been experiencing swelling in his right leg, which has improved but remains slightly swollen. He uses compression socks at home to help reduce the swelling, although he finds it difficult to put them on due to trouble with his leg. He mentions having some soccer socks that provide a decent amount of compression and are easier to wear.  He is currently on antibiotics for cellulitis and has a day and a half left of the course. He experienced some itching with the Unna boots but no significant issues. The color of the affected area has deepened.  He inquires about resuming exercise, expressing concern that it might exacerbate the swelling. He has stopped exercising due to this concern but is open to resuming activity. He mentions that he does not sit down much but elevates his legs when he does.  No shortness of breath, fevers, chills, or weakness. He mentions surviving the cold weather well.          Relevant past medical, surgical, family, and social history reviewed and updated as indicated.  Allergies and medications reviewed and updated. Data reviewed: Chart in Epic.   Past Medical History:  Diagnosis Date   Anal fissure     Arthritis    Atrial fibrillation (HCC)    BPH (benign prostatic hypertrophy)    CAD (coronary artery disease)    Dr. Blanca   Cataract    COVID-19    Depression 2004   Diverticulosis    GERD (gastroesophageal reflux disease)    History of TIAs    Hyperlipidemia    Hypertension    Hypertensive heart disease without CHF    Hypothyroidism    Internal hemorrhoids    Lumbar disc disease    NSTEMI (non-ST elevated myocardial infarction) (HCC) 01/22/2024   Oral bleeding 08/20/2012   Following molar tooth extraction July, 2013   Pneumonia    Ruptured disk 1995   Shingles    Thyroid  disease    Vitamin D  deficiency    Vitreous detachment     Past Surgical History:  Procedure Laterality Date   bilateral cataract surgery  6/07   CARDIAC CATHETERIZATION     CORONARY ARTERY BYPASS GRAFT  12/99    Dr. Army DOWNS ANGIOGRAPHY N/A 01/28/2024   Procedure: CORONARY/GRAFT ANGIOGRAPHY;  Surgeon: Elmira Newman PARAS, MD;  Location: [facility];  Service: Cardiovascular;  Laterality: N/A;   cysloscopy  8/08   EYE SURGERY     HERNIA REPAIR  1997   right   herninated disc  1995   PACEMAKER INSERTION  11/03/2020   PROSTATE BIOPSY  8/08   TONSILLECTOMY AND ADENOIDECTOMY      Social History   Socioeconomic History  Marital status: Widowed    Spouse name: [name]    Number of children: 1   Years of education: 12   Highest education level: Not on file  Occupational History   Occupation: Retired    Veterinary Surgeon: Company Secretary x 27 years   Occupation: Retired    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    Comment: supervisor x 17 years  Tobacco Use   Smoking status: Former    Current packs/day: 0.00    Average packs/day: 1 pack/day for 18.0 years (18.0 ttl pk-yrs)    Types: Cigarettes    Start date: 12/17/1948    Quit date: 12/17/1966    Years since quitting: 58.1   Smokeless tobacco: Never  Vaping Use   Vaping status: Never Used  Substance and Sexual Activity   Alcohol use: Yes     Alcohol/week: 1.0 standard drink of alcohol    Types: 1 Standard drinks or equivalent per week    Comment: rare   Drug use: No   Sexual activity: Not Currently  Other Topics Concern   Not on file  Social History Narrative   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-handed      Caffeine use: none      Son and grandson live with him   Social Drivers of Health   Tobacco Use: Medium Risk (01/22/2025)   Patient History    Smoking Tobacco Use: Former    Smokeless Tobacco Use: Never    Passive Exposure: Not on Actuary Strain: Low Risk (03/05/2024)   Overall Financial Resource Strain (CARDIA)    Difficulty of Paying Living Expenses: Not very hard  Food Insecurity: No Food Insecurity (03/05/2024)   Hunger Vital Sign    Worried About Running Out of Food in the Last Year: Never true    Ran Out of Food in the Last Year: Never true  Transportation Needs: No Transportation Needs (03/05/2024)   PRAPARE - Administrator, Civil Service (Medical): No    Lack of Transportation (Non-Medical): No  Physical Activity: Sufficiently Active (03/05/2024)   Exercise Vital Sign    Days of Exercise per Week: 7 days    Minutes of Exercise per Session: 70 min  Stress: No Stress Concern Present (03/05/2024)   [employer] of Occupational Health - Occupational Stress Questionnaire    Feeling of Stress : Not at all  Social Connections: Socially Isolated (03/05/2024)   Social Connection and Isolation Panel    Frequency of Communication with Friends and Family: More than three times a week    Frequency of Social Gatherings with Friends and Family: Once a week    Attends Religious Services: Never    Database Administrator or Organizations: No    Attends Banker Meetings: Never    Marital Status: Widowed  Intimate Partner Violence: Not At Risk (03/05/2024)   Humiliation, Afraid, Rape, and Kick questionnaire    Fear of Current or  Ex-Partner: No    Emotionally Abused: No    Physically Abused: No    Sexually Abused: No  Depression (PHQ2-9): Low Risk (01/14/2025)   Depression (PHQ2-9)    PHQ-2 Score: 0  Alcohol Screen: Low Risk (03/05/2024)   Alcohol Screen    Last Alcohol Screening Score (AUDIT): 4  Housing: Low Risk (03/05/2024)   Housing Stability Vital Sign    Unable to Pay for Housing in the Last Year: No    Number  of Times Moved in the Last Year: 0    Homeless in the Last Year: No  Utilities: Not At Risk (03/05/2024)   AHC Utilities    Threatened with loss of utilities: No  Health Literacy: Adequate Health Literacy (03/05/2024)   B1300 Health Literacy    Frequency of need for help with medical instructions: Never    Outpatient Encounter Medications as of 01/22/2025  Medication Sig   amLODipine  (NORVASC ) 2.5 MG tablet Take 1 tablet (2.5 mg total) by mouth daily.   apixaban  (ELIQUIS ) 5 MG TABS tablet TAKE 1 TABLET TWICE A DAY   azelastine  (ASTELIN ) 0.1 % nasal spray USE 1 SPRAY IN EACH NOSTRIL TWICE A DAY   clopidogrel  (PLAVIX ) 75 MG tablet Take 1 tablet (75 mg total) by mouth daily.   doxazosin  (CARDURA ) 8 MG tablet Take 1 tablet (8 mg total) by mouth at bedtime.   doxycycline  (VIBRA -TABS) 100 MG tablet Take 1 tablet (100 mg total) by mouth 2 (two) times daily for 10 days.   ezetimibe  (ZETIA ) 10 MG tablet Take 1 tablet (10 mg total) by mouth daily.   furosemide  (LASIX ) 40 MG tablet Take 1 tablet (40 mg total) by mouth daily as needed for fluid.   isosorbide  mononitrate (IMDUR ) 30 MG 24 hr tablet Take 1 tablet (30 mg total) by mouth daily.   Lactobacillus (PROBIOTIC ACIDOPHILUS) CAPS Take 1 capsule by mouth daily.   levothyroxine  (SYNTHROID ) 50 MCG tablet Take 1 tablet (50 mcg total) by mouth daily before breakfast.   meclizine (ANTIVERT) 25 MG tablet Take 25 mg by mouth daily as needed for dizziness.   mirtazapine  (REMERON ) 15 MG tablet TAKE 1 TABLET AT BEDTIME   Multiple Vitamin (MULTIVITAMIN) tablet Take 1  tablet by mouth daily.   pantoprazole  (PROTONIX ) 40 MG tablet Take 1 tablet (40 mg total) by mouth daily.   ramipril  (ALTACE ) 10 MG capsule TAKE 1 CAPSULE DAILY   rosuvastatin  (CRESTOR ) 10 MG tablet Take 1 tablet (10 mg total) by mouth every other day.   SIMETHICONE  PO Take 1 tablet by mouth as needed (gas).   triamcinolone  cream (KENALOG ) 0.1 % Apply 1 Application topically 2 (two) times daily.   vitamin C (ASCORBIC ACID) 500 MG tablet Take 500 mg by mouth daily.    No facility-administered encounter medications on file as of 01/22/2025.    Allergies[1]  Pertinent ROS per HPI, otherwise unremarkable      Objective:  BP (!) 175/92   Pulse 88   Temp 97.6 F (36.4 C)   Ht 6' 1 (1.854 m)   Wt 167 lb 6.4 oz (75.9 kg)   SpO2 97%   BMI 22.09 kg/m    Wt Readings from Last 3 Encounters:  01/22/25 167 lb 6.4 oz (75.9 kg)  01/20/25 165 lb 3.2 oz (74.9 kg)  01/14/25 167 lb 12.8 oz (76.1 kg)    Physical Exam Vitals and nursing note reviewed.  Constitutional:      General: He is not in acute distress.    Appearance: Normal appearance. He is normal weight. He is not ill-appearing, toxic-appearing or diaphoretic.  HENT:     Head: Normocephalic and atraumatic.     Mouth/Throat:     Mouth: Mucous membranes are moist.  Eyes:     Pupils: Pupils are equal, round, and reactive to light.  Cardiovascular:     Rate and Rhythm: Normal rate and regular rhythm.     Heart sounds: Normal heart sounds.  Pulmonary:     Effort:  Pulmonary effort is normal.     Breath sounds: Normal breath sounds.  Musculoskeletal:     Cervical back: Neck supple.     Right lower leg: Edema (minimal) present.     Left lower leg: No edema.  Skin:    General: Skin is warm and dry.     Capillary Refill: Capillary refill takes less than 2 seconds.     Findings: Erythema (RLL, mild to LLL - improving) present. No rash.  Neurological:     General: No focal deficit present.     Mental Status: He is alert and  oriented to person, place, and time.  Psychiatric:        Mood and Affect: Mood normal.        Behavior: Behavior normal.        Thought Content: Thought content normal.        Judgment: Judgment normal.     Results for orders placed or performed in visit on 01/06/25  CBC with Differential   Collection Time: 01/06/25 10:57 AM  Result Value Ref Range   WBC 5.6 4.0 - 10.5 K/uL   RBC 3.71 (L) 4.22 - 5.81 MIL/uL   Hemoglobin 11.2 (L) 13.0 - 17.0 g/dL   HCT 65.2 (L) 60.9 - 47.9 %   MCV 93.5 80.0 - 100.0 fL   MCH 30.2 26.0 - 34.0 pg   MCHC 32.3 30.0 - 36.0 g/dL   RDW 84.3 (H) 88.4 - 84.4 %   Platelets 117 (L) 150 - 400 K/uL   nRBC 0.0 0.0 - 0.2 %   Neutrophils Relative % 64 %   Neutro Abs 3.5 1.7 - 7.7 K/uL   Lymphocytes Relative 24 %   Lymphs Abs 1.4 0.7 - 4.0 K/uL   Monocytes Relative 9 %   Monocytes Absolute 0.5 0.1 - 1.0 K/uL   Eosinophils Relative 3 %   Eosinophils Absolute 0.2 0.0 - 0.5 K/uL   Basophils Relative 0 %   Basophils Absolute 0.0 0.0 - 0.1 K/uL   Immature Granulocytes 0 %   Abs Immature Granulocytes 0.01 0.00 - 0.07 K/uL       Pertinent labs & imaging results that were available during my care of the patient were reviewed by me and considered in my medical decision making.  Assessment & Plan:  Dalton Vaughan was seen today for cellulitis.  Diagnoses and all orders for this visit:  Cellulitis of right lower extremity  Cellulitis of left lower extremity       Cellulitis of right lower extremity Improving cellulitis with residual swelling. Color change indicates improvement. Mild itching from Foot locker noted. - Continue Unna boot on right leg until Tuesday. - Wear compression socks or Unna boot during exercise to aid swelling reduction. - Elevate legs when sitting to reduce swelling. - Scheduled follow-up appointment for Tuesday for re-evaluation and possible rewrapping.  Cellulitis of left lower extremity Improving cellulitis with residual swelling.  Antibiotics expected to resolve the condition. - Continue current antibiotic regimen. - No need for Unna boot on left leg.          Continue all other maintenance medications.  Follow up plan: Return in about 4 days (around 01/26/2025), or if symptoms worsen or fail to improve, for [employer].   Continue healthy lifestyle choices, including diet (rich in fruits, vegetables, and lean proteins, and low in salt and simple carbohydrates) and exercise (at least 30 minutes of moderate physical activity daily).  Educational handout given for cellulitis  The above assessment and management plan was discussed with the patient. The patient verbalized understanding of and has agreed to the management plan. Patient is aware to call the clinic if they develop any new symptoms or if symptoms persist or worsen. Patient is aware when to return to the clinic for a follow-up visit. Patient educated on when it is appropriate to go to the emergency department.   Rosaline Bruns, FNP-C [facility] [phone]     [1]  Allergies Allergen Reactions   Paxil [Paroxetine Hcl] Palpitations   Codeine Other (See Comments)   Eliquis  Dodie ] Other (See Comments)    dizziness   Pravachol [Pravastatin Sodium] Other (See Comments)    myalgias   Zetia  [Ezetimibe ] Other (See Comments)    myalgias   Penicillins Hives, Rash and Other (See Comments)    Happened in 1959   Vytorin [Ezetimibe -Simvastatin] Other (See Comments)    myalgias   "

## 2025-01-22 NOTE — Patient Instructions (Signed)
Equate Eczema Cream

## 2025-01-26 ENCOUNTER — Ambulatory Visit: Admitting: Family Medicine

## 2025-03-02 ENCOUNTER — Ambulatory Visit: Admitting: Family Medicine

## 2025-04-06 ENCOUNTER — Inpatient Hospital Stay

## 2025-04-06 ENCOUNTER — Other Ambulatory Visit (HOSPITAL_COMMUNITY)

## 2025-04-15 ENCOUNTER — Inpatient Hospital Stay: Admitting: Oncology

## 2025-06-11 ENCOUNTER — Ambulatory Visit: Admitting: Family Medicine
